# Patient Record
Sex: Female | Born: 1945 | Race: White | Hispanic: No | Marital: Married | Smoking: Former smoker
Health system: Southern US, Community
[De-identification: ages and names within clinical notes are randomized; demographics above are authoritative.]

## PROBLEM LIST (undated history)

## (undated) DIAGNOSIS — E785 Hyperlipidemia, unspecified: Secondary | ICD-10-CM

## (undated) DIAGNOSIS — I10 Essential (primary) hypertension: Secondary | ICD-10-CM

## (undated) DIAGNOSIS — F410 Panic disorder [episodic paroxysmal anxiety] without agoraphobia: Secondary | ICD-10-CM

## (undated) DIAGNOSIS — E119 Type 2 diabetes mellitus without complications: Secondary | ICD-10-CM

## (undated) DIAGNOSIS — J449 Chronic obstructive pulmonary disease, unspecified: Secondary | ICD-10-CM

## (undated) DIAGNOSIS — M1990 Unspecified osteoarthritis, unspecified site: Secondary | ICD-10-CM

## (undated) DIAGNOSIS — E039 Hypothyroidism, unspecified: Secondary | ICD-10-CM

## (undated) DIAGNOSIS — R0600 Dyspnea, unspecified: Secondary | ICD-10-CM

## (undated) DIAGNOSIS — D649 Anemia, unspecified: Secondary | ICD-10-CM

## (undated) DIAGNOSIS — K219 Gastro-esophageal reflux disease without esophagitis: Secondary | ICD-10-CM

## (undated) DIAGNOSIS — J189 Pneumonia, unspecified organism: Secondary | ICD-10-CM

## (undated) DIAGNOSIS — K2970 Gastritis, unspecified, without bleeding: Secondary | ICD-10-CM

## (undated) DIAGNOSIS — R1011 Right upper quadrant pain: Secondary | ICD-10-CM

## (undated) HISTORY — DX: Hyperlipidemia, unspecified: E78.5

## (undated) HISTORY — DX: Gastritis, unspecified, without bleeding: K29.70

## (undated) HISTORY — PX: TUBAL LIGATION: SHX77

---

## 2004-06-09 ENCOUNTER — Ambulatory Visit: Payer: Self-pay | Admitting: Endocrinology

## 2004-06-10 ENCOUNTER — Ambulatory Visit: Payer: Self-pay | Admitting: General Practice

## 2004-07-20 ENCOUNTER — Observation Stay (HOSPITAL_COMMUNITY): Admission: EM | Admit: 2004-07-20 | Discharge: 2004-07-21 | Payer: Self-pay | Admitting: Emergency Medicine

## 2004-07-22 ENCOUNTER — Ambulatory Visit (HOSPITAL_COMMUNITY): Admission: RE | Admit: 2004-07-22 | Discharge: 2004-07-22 | Payer: Self-pay | Admitting: Internal Medicine

## 2005-08-05 ENCOUNTER — Ambulatory Visit: Payer: Self-pay | Admitting: General Practice

## 2005-08-13 ENCOUNTER — Ambulatory Visit: Payer: Self-pay | Admitting: Endocrinology

## 2006-01-18 HISTORY — PX: BREAST BIOPSY: SHX20

## 2006-02-07 ENCOUNTER — Ambulatory Visit: Payer: Self-pay | Admitting: Endocrinology

## 2006-03-17 ENCOUNTER — Ambulatory Visit: Payer: Self-pay | Admitting: Gastroenterology

## 2006-07-25 ENCOUNTER — Ambulatory Visit: Payer: Self-pay | Admitting: General Surgery

## 2013-10-24 ENCOUNTER — Ambulatory Visit: Payer: Self-pay | Admitting: Endocrinology

## 2014-10-17 ENCOUNTER — Other Ambulatory Visit: Payer: Self-pay | Admitting: Endocrinology

## 2014-10-17 DIAGNOSIS — Z1231 Encounter for screening mammogram for malignant neoplasm of breast: Secondary | ICD-10-CM

## 2014-10-30 ENCOUNTER — Ambulatory Visit: Payer: Self-pay

## 2014-10-31 ENCOUNTER — Other Ambulatory Visit: Payer: Self-pay | Admitting: Endocrinology

## 2014-10-31 ENCOUNTER — Ambulatory Visit
Admission: RE | Admit: 2014-10-31 | Discharge: 2014-10-31 | Disposition: A | Discharge status: Home / Self Care | Payer: Medicare HMO | Source: Ambulatory Visit | Attending: Endocrinology | Admitting: Endocrinology

## 2014-10-31 DIAGNOSIS — Z1231 Encounter for screening mammogram for malignant neoplasm of breast: Secondary | ICD-10-CM | POA: Diagnosis not present

## 2015-10-21 ENCOUNTER — Other Ambulatory Visit: Payer: Self-pay | Admitting: Endocrinology

## 2015-10-21 DIAGNOSIS — Z1239 Encounter for other screening for malignant neoplasm of breast: Secondary | ICD-10-CM

## 2015-11-14 ENCOUNTER — Ambulatory Visit
Admission: RE | Admit: 2015-11-14 | Discharge: 2015-11-14 | Disposition: A | Discharge status: Home / Self Care | Payer: Medicare HMO | Source: Ambulatory Visit | Attending: Endocrinology | Admitting: Endocrinology

## 2015-11-14 DIAGNOSIS — Z1231 Encounter for screening mammogram for malignant neoplasm of breast: Secondary | ICD-10-CM | POA: Insufficient documentation

## 2015-11-14 DIAGNOSIS — Z1239 Encounter for other screening for malignant neoplasm of breast: Secondary | ICD-10-CM

## 2016-07-14 ENCOUNTER — Encounter: Payer: Self-pay | Admitting: Physician Assistant

## 2016-07-14 ENCOUNTER — Ambulatory Visit (INDEPENDENT_AMBULATORY_CARE_PROVIDER_SITE_OTHER): Payer: Medicare HMO | Admitting: Physician Assistant

## 2016-07-14 VITALS — BP 124/72 | HR 71 | Temp 97.6°F | Resp 16 | Ht 64.5 in | Wt 203.6 lb

## 2016-07-14 DIAGNOSIS — Z1212 Encounter for screening for malignant neoplasm of rectum: Secondary | ICD-10-CM

## 2016-07-14 DIAGNOSIS — I10 Essential (primary) hypertension: Secondary | ICD-10-CM

## 2016-07-14 DIAGNOSIS — Z1211 Encounter for screening for malignant neoplasm of colon: Secondary | ICD-10-CM | POA: Insufficient documentation

## 2016-07-14 DIAGNOSIS — K219 Gastro-esophageal reflux disease without esophagitis: Secondary | ICD-10-CM

## 2016-07-14 DIAGNOSIS — E785 Hyperlipidemia, unspecified: Secondary | ICD-10-CM | POA: Diagnosis not present

## 2016-07-14 DIAGNOSIS — E1165 Type 2 diabetes mellitus with hyperglycemia: Secondary | ICD-10-CM | POA: Insufficient documentation

## 2016-07-14 DIAGNOSIS — R600 Localized edema: Secondary | ICD-10-CM | POA: Insufficient documentation

## 2016-07-14 DIAGNOSIS — Z7689 Persons encountering health services in other specified circumstances: Secondary | ICD-10-CM | POA: Diagnosis not present

## 2016-07-14 DIAGNOSIS — E119 Type 2 diabetes mellitus without complications: Secondary | ICD-10-CM

## 2016-07-14 MED ORDER — HYDROCHLOROTHIAZIDE 25 MG PO TABS: 25.0000 mg | ORAL_TABLET | Freq: Every day | ORAL | 3 refills | Status: DC

## 2016-07-14 MED ORDER — ASPIRIN 81 MG PO TABS: 81.0000 mg | ORAL_TABLET | Freq: Every day | ORAL | 11 refills | Status: DC

## 2016-07-14 NOTE — Progress Notes (Signed)
Patient ID: Tamara [name] MRN: [id number], DOB: Jun 03, 1945, 71 y.o. Date of Encounter: @DATE @  Chief Complaint:  Chief Complaint  Patient presents with  . New Patient (Initial Visit)    HPI: 71 y.o. year old female  presents As a new patient to establish care.  Her husband sees me and I'm his primary care provider. She states that she had been seeing a PCP in [place]. Says that her last routine office visit there was almost 3 months ago says that her 3 month follow-up is due July 9th.  She says that "he will refer me for anything "and "he doesn't believe in colonoscopy ".  Says that her diabetes was diagnosed in 2008.  Is that she does have a blood pressure cuff at home. Says that she hasn't taken the lisinopril for the past 3 days because her blood pressure was reading low and she was feeling weak.  Reviewed that her medication list states Lasix 40 mg as needed. I asked how often she takes this on average and she says that she takes this every 2-3 days.  Says that she did have DEXA scan this year which was normal. This is she does have mammogram every year. Says that she has flu shot and has had pneumonia shot. Says that she doesn't need a shingles vaccine because she never had chickenpox.  Says that she takes Protonix every day and still feels indigestion symptoms. Has to put take Protonix in addition to Alka-Seltzer and Tums to try to get relief. Says that she informed her prior PCP about this multiple times and he kept mentioning doing a referral but never did do this.  Also she is interested in/agreeable to have colonoscopy for colorectal cancer screening.  She knows of no contraindication for her to take aspirin. Has had no diagnosed stomach ulcer or bleeding.  She is not fasting today.  No other specific concerns to address today.   History reviewed. No pertinent past medical history.   Home Meds: No outpatient prescriptions prior to visit.   No  facility-administered medications prior to visit.     Allergies:  Allergies  Allergen Reactions  . Penicillins Hives    Social History   Social History  . Marital status: Married    Spouse name: N/A  . Number of children: N/A  . Years of education: N/A   Occupational History  . Not on file.   Social History Main Topics  . Smoking status: Former Research scientist (life sciences)  . Smokeless tobacco: Never Used  . Alcohol use No  . Drug use: No  . Sexual activity: Not on file   Other Topics Concern  . Not on file   Social History Narrative  . No narrative on file    Family History  Problem Relation Age of Onset  . Breast cancer Neg Hx      Review of Systems:  See HPI for pertinent ROS. All other ROS negative.    Physical Exam: Blood pressure 124/72, pulse 71, temperature 97.6 F (36.4 C), temperature source Oral, resp. rate 16, height 5' 4.5" (1.638 m), weight 203 lb 9.6 oz (92.4 kg), SpO2 98 %., Body mass index is 34.41 kg/m. General: WF. Appears in no acute distress. Neck: Supple. No thyromegaly. No lymphadenopathy. Lungs: Clear bilaterally to auscultation without wheezes, rales, or rhonchi. Breathing is unlabored. Heart: RRR with S1 S2. No murmurs, rubs, or gallops. Abdomen: Soft, non-tender, non-distended with normoactive bowel sounds. No hepatomegaly. No rebound/guarding. No obvious abdominal masses.  Musculoskeletal:  Strength and tone normal for age. Extremities/Skin: Warm and dry.  No edema. Neuro: Alert and oriented X 3. Moves all extremities spontaneously. Gait is normal. CNII-XII grossly in tact. Psych:  Responds to questions appropriately with a normal affect.     ASSESSMENT AND PLAN:  71 y.o. year old female with   Encounter to establish care with new doctor   Type 2 diabetes mellitus without complication, without long-term current use of insulin ([place]) She states that it has not been quite 3 months since her last visit with prior PCP. Also she is not fasting  today. I will have her return for follow-up visit at the end of July and will have her come fasting to that appointment.  She is on metformin and glimepiride.  She is on ACE inhibitor. She is on statin. We'll have her add baby aspirin daily. - aspirin 81 MG tablet; Take 1 tablet (81 mg total) by mouth daily.  Dispense: 30 tablet; Refill: 11  -------Will discuss diabetic eye exam and diabetic foot exam at her next visit.  Hyperlipidemia, unspecified hyperlipidemia type She is on Crestor. She is not fasting today. Will have her schedule follow-up visit for the end of July and come fasting to recheck lab at that visit.  Essential hypertension Currently her Lasix is 40 mg every 2 days. It would make a lot more sense for her to take 20 mg on a daily basis or even changed to HCTZ. At this time I'll discontinue the Lasix and will change to HCTZ 25 mg daily. She also reports that she stopped taking the lisinopril for the past 3 days because she was feeling weak and lightheaded and her blood pressure was reading low. I wonder if she had taken the Lasix on that day when she was feeling flat weak and lightheaded and her blood pressure was reading low. Think that if we stop this high-dose Lasix intermittent dosing then she can tolerate ACE inhibitor which she needs given her diabetes. Therefore changing the Lasix to HCTZ 25 mg daily and will have her try to resume the lisinopril.    Bilateral lower extremity edema Currently her Lasix is 40 mg every 2 days. It would make a lot more sense for her to take 20 mg on a daily basis or even changed to HCTZ. At this time I'll discontinue the Lasix and will change to HCTZ 25 mg daily. She also reports that she stopped taking the lisinopril for the past 3 days because she was feeling weak and lightheaded and her blood pressure was reading low. I wonder if she had taken the Lasix on that day when she was feeling flat weak and lightheaded and her blood pressure was  reading low. Think that if we stop this high-dose Lasix intermittent dosing then she can tolerate ACE inhibitor which she needs given her diabetes. Therefore changing the Lasix to HCTZ 25 mg daily and will have her try to resume the lisinopril. - hydrochlorothiazide (HYDRODIURIL) 25 MG tablet; Take 1 tablet (25 mg total) by mouth daily.  Dispense: 30 tablet; Refill: 3  Gastroesophageal reflux disease, esophagitis presence not specified She is on protonix but symptoms are not well controlled with this. Needs follow-up with GI for possible endoscopy. - Ambulatory referral to Gastroenterology  Screening for colorectal cancer She has had no colonoscopy but is agreeable to have this for colorectal cancer screening. - Ambulatory referral to Gastroenterology  She states that she has mammogram every year.  She states that  she had DEXA scan this past year and reports that this was normal.  He reports that she has flu shots and has had a pneumonia shot. Says that she never had chickenpox so does not need shingles vaccine. We'll further discuss preventative care at follow-up visit.  Signed, [address] [place], [place], [facility] 07/14/2016 10:15 AM

## 2016-07-23 ENCOUNTER — Telehealth: Payer: Self-pay | Admitting: Physician Assistant

## 2016-07-23 ENCOUNTER — Other Ambulatory Visit: Payer: Self-pay

## 2016-07-23 DIAGNOSIS — R600 Localized edema: Secondary | ICD-10-CM

## 2016-07-23 DIAGNOSIS — E119 Type 2 diabetes mellitus without complications: Secondary | ICD-10-CM

## 2016-07-23 MED ORDER — ROSUVASTATIN CALCIUM 40 MG PO TABS: 40.0000 mg | ORAL_TABLET | Freq: Every day | ORAL | 0 refills | Status: DC

## 2016-07-23 MED ORDER — HYDROCHLOROTHIAZIDE 25 MG PO TABS: 25.0000 mg | ORAL_TABLET | Freq: Every day | ORAL | 3 refills | Status: DC

## 2016-07-23 MED ORDER — GLIMEPIRIDE 2 MG PO TABS: 2.0000 mg | ORAL_TABLET | Freq: Three times a day (TID) | ORAL | 0 refills | Status: DC

## 2016-07-23 MED ORDER — PANTOPRAZOLE SODIUM 40 MG PO TBEC: 40.0000 mg | DELAYED_RELEASE_TABLET | Freq: Every day | ORAL | 0 refills | Status: DC

## 2016-07-23 MED ORDER — METFORMIN HCL 1000 MG PO TABS: 1000.0000 mg | ORAL_TABLET | Freq: Two times a day (BID) | ORAL | 0 refills | Status: DC

## 2016-07-23 MED ORDER — ASPIRIN 81 MG PO TABS: 81.0000 mg | ORAL_TABLET | Freq: Every day | ORAL | 11 refills | Status: DC

## 2016-07-23 MED ORDER — LISINOPRIL 10 MG PO TABS: 10.0000 mg | ORAL_TABLET | Freq: Every day | ORAL | 0 refills | Status: DC

## 2016-07-23 MED ORDER — POTASSIUM CHLORIDE CRYS ER 20 MEQ PO TBCR: 20.0000 meq | EXTENDED_RELEASE_TABLET | Freq: Every day | ORAL | 0 refills | Status: DC

## 2016-07-23 MED ORDER — RANITIDINE HCL 300 MG PO TABS: 300.0000 mg | ORAL_TABLET | Freq: Every day | ORAL | 0 refills | Status: DC | PRN

## 2016-07-23 NOTE — Telephone Encounter (Signed)
I called patient about a referral. She stated that she needs all of her medication called into [facility] she states she is about out.   CB# [phone]

## 2016-07-23 NOTE — Telephone Encounter (Signed)
Refills sent to [facility]. 

## 2016-07-27 ENCOUNTER — Telehealth: Payer: Self-pay

## 2016-07-27 NOTE — Telephone Encounter (Signed)
[facility] sent over a fax request to confirm dose strength on patient medication Crestor. I called patient and she states her previous provider put her on crestor 40 mg but pt states that was to high so she changed it to 10mg ,  Patient has not had lab work done with our office yet so I explained to patient we could not send in refill. She has lab work done and then the provider will  decide if she wants to make changes to the medication.

## 2016-07-31 DIAGNOSIS — Z6833 Body mass index (BMI) 33.0-33.9, adult: Secondary | ICD-10-CM | POA: Diagnosis not present

## 2016-07-31 DIAGNOSIS — I10 Essential (primary) hypertension: Secondary | ICD-10-CM | POA: Diagnosis not present

## 2016-07-31 DIAGNOSIS — E119 Type 2 diabetes mellitus without complications: Secondary | ICD-10-CM | POA: Diagnosis not present

## 2016-07-31 DIAGNOSIS — K219 Gastro-esophageal reflux disease without esophagitis: Secondary | ICD-10-CM | POA: Diagnosis not present

## 2016-07-31 DIAGNOSIS — E785 Hyperlipidemia, unspecified: Secondary | ICD-10-CM | POA: Diagnosis not present

## 2016-07-31 DIAGNOSIS — E876 Hypokalemia: Secondary | ICD-10-CM | POA: Diagnosis not present

## 2016-08-20 ENCOUNTER — Telehealth: Payer: Self-pay | Admitting: Physician Assistant

## 2016-08-20 MED ORDER — GLUCOSE BLOOD VI STRP: ORAL_STRIP | 12 refills | Status: DC

## 2016-08-20 NOTE — Telephone Encounter (Signed)
Pt needs her test strips called into [facility] she uses the [facility].

## 2016-08-20 NOTE — Telephone Encounter (Signed)
Test strips sent to Tamara [name]

## 2016-08-23 ENCOUNTER — Encounter: Payer: Self-pay | Admitting: Physician Assistant

## 2016-08-23 ENCOUNTER — Ambulatory Visit (INDEPENDENT_AMBULATORY_CARE_PROVIDER_SITE_OTHER): Payer: Medicare HMO | Admitting: Physician Assistant

## 2016-08-23 VITALS — BP 124/86 | HR 74 | Temp 97.7°F | Resp 16 | Ht 65.0 in | Wt 203.0 lb

## 2016-08-23 DIAGNOSIS — E119 Type 2 diabetes mellitus without complications: Secondary | ICD-10-CM | POA: Diagnosis not present

## 2016-08-23 DIAGNOSIS — Z1159 Encounter for screening for other viral diseases: Secondary | ICD-10-CM | POA: Insufficient documentation

## 2016-08-23 DIAGNOSIS — E785 Hyperlipidemia, unspecified: Secondary | ICD-10-CM

## 2016-08-23 DIAGNOSIS — K219 Gastro-esophageal reflux disease without esophagitis: Secondary | ICD-10-CM

## 2016-08-23 DIAGNOSIS — Z1211 Encounter for screening for malignant neoplasm of colon: Secondary | ICD-10-CM | POA: Diagnosis not present

## 2016-08-23 DIAGNOSIS — Z1212 Encounter for screening for malignant neoplasm of rectum: Secondary | ICD-10-CM

## 2016-08-23 DIAGNOSIS — R600 Localized edema: Secondary | ICD-10-CM

## 2016-08-23 DIAGNOSIS — I10 Essential (primary) hypertension: Secondary | ICD-10-CM | POA: Diagnosis not present

## 2016-08-23 LAB — COMPLETE METABOLIC PANEL WITH GFR
ALBUMIN: 4.4 g/dL (ref 3.6–5.1)
ALT: 9 U/L (ref 6–29)
AST: 14 U/L (ref 10–35)
Alkaline Phosphatase: 47 U/L (ref 33–130)
BILIRUBIN TOTAL: 0.4 mg/dL (ref 0.2–1.2)
BUN: 20 mg/dL (ref 7–25)
CALCIUM: 9.8 mg/dL (ref 8.6–10.4)
CHLORIDE: 101 mmol/L (ref 98–110)
CO2: 26 mmol/L (ref 20–32)
CREATININE: 0.92 mg/dL (ref 0.50–0.99)
GFR, EST AFRICAN AMERICAN: 73 mL/min (ref >=60)
GFR, Est Non African American: 64 mL/min (ref >=60)
Glucose, Bld: 104 mg/dL — ABNORMAL HIGH (ref 70–99)
Potassium: 4.1 mmol/L (ref 3.5–5.3)
SODIUM: 138 mmol/L (ref 135–146)
TOTAL PROTEIN: 6.9 g/dL (ref 6.1–8.1)

## 2016-08-23 LAB — LIPID PANEL
CHOL/HDL RATIO: 3.6 ratio (ref <5.0)
CHOLESTEROL: 171 mg/dL (ref <200)
HDL: 47 mg/dL — AB (ref >50)
LDL CALC: 95 mg/dL (ref <100)
Triglycerides: 147 mg/dL (ref <150)
VLDL: 29 mg/dL (ref <30)

## 2016-08-23 NOTE — Progress Notes (Signed)
Patient ID: Tamara [name] MRN: [id number], DOB: 1945/02/06, 71 y.o. Date of Encounter: @DATE @  Chief Complaint:  Chief Complaint  Patient presents with  . 70 week routine office visit    HPI: 71 y.o. year old female     07/14/2016: presents As a new patient to establish care.  Her husband sees me and I'm his primary care provider. She states that she had been seeing a PCP in [place]. Says that her last routine office visit there was almost 3 months ago says that her 3 month follow-up is due July 9th.  She says that "he will refer me for anything "and "he doesn't believe in colonoscopy ".  Says that her diabetes was diagnosed in 2008.  Is that she does have a blood pressure cuff at home. Says that she hasn't taken the lisinopril for the past 3 days because her blood pressure was reading low and she was feeling weak.  Reviewed that her medication list states Lasix 40 mg as needed. I asked how often she takes this on average and she says that she takes this every 2-3 days.  Says that she did have DEXA scan this year which was normal. This is she does have mammogram every year. Says that she has flu shot and has had pneumonia shot. Says that she doesn't need a shingles vaccine because she never had chickenpox.  Says that she takes Protonix every day and still feels indigestion symptoms. Has to put take Protonix in addition to Alka-Seltzer and Tums to try to get relief. Says that she informed her prior PCP about this multiple times and he kept mentioning doing a referral but never did do this.  Also she is interested in/agreeable to have colonoscopy for colorectal cancer screening.  She knows of no contraindication for her to take aspirin. Has had no diagnosed stomach ulcer or bleeding.  She is not fasting today.  No other specific concerns to address today.    AT THAT OV:  She states that it has not been quite 3 months since her last visit with prior PCP. Also  she is not fasting today. I will have her return for follow-up visit at the end of July and will have her come fasting to that appointment.   Currently her Lasix is 40 mg every 2 days. It would make a lot more sense for her to take 20 mg on a daily basis or even changed to HCTZ. At this time I'll discontinue the Lasix and will change to HCTZ 25 mg daily. She also reports that she stopped taking the lisinopril for the past 3 days because she was feeling weak and lightheaded and her blood pressure was reading low. I wonder if she had taken the Lasix on that day when she was feeling flat weak and lightheaded and her blood pressure was reading low. Think that if we stop this high-dose Lasix intermittent dosing then she can tolerate ACE inhibitor which she needs given her diabetes. Therefore changing the Lasix to HCTZ 25 mg daily and will have her try to resume the lisinopril.  She is on protonix but symptoms are not well controlled with this. Needs follow-up with GI for possible endoscopy. - Ambulatory referral to Gastroenterology  She has had no colonoscopy but is agreeable to have this for colorectal cancer screening. - Ambulatory referral to Gastroenterology    08/23/2016: Last office visit I also recommended her start aspirin. She states that she is not gonna start the  aspirin until she has her evaluation with GI. Otherwise she did follow through with everything that I had recommended at last visit. She is fasting today for lab. She is able to leave a urine sample today. She did stop the Lasix. She did start the HCTZ daily and says that the swelling is "fine". She has been able to resume the lisinopril and is taking this daily and is no longer having any lightheadedness or other adverse effects. Says that all that resolved since we changed the Lasix to HCTZ. She states that she did get her appointment with GI as scheduled and its for next Monday one week from today. She reports that she does  have diabetic eye exam annually. Says the last one was around November or December 2017. Says "got new glasses then 2 ". She reports that her prior PCP had educated her regarding diabetic foot care. She has no other specific concerns to address today.     No past medical history on file.   Home Meds: Outpatient Medications Prior to Visit  Medication Sig Dispense Refill  . aspirin 81 MG tablet Take 1 tablet (81 mg total) by mouth daily. 90 tablet 11  . glimepiride (AMARYL) 2 MG tablet Take 1 tablet (2 mg total) by mouth 3 (three) times daily. 270 tablet 0  . glucose blood test strip Use as instructed to check blood sugar twice daily 100 each 12  . hydrochlorothiazide (HYDRODIURIL) 25 MG tablet Take 1 tablet (25 mg total) by mouth daily. 30 tablet 3  . lisinopril (PRINIVIL,ZESTRIL) 10 MG tablet Take 1 tablet (10 mg total) by mouth daily. 90 tablet 0  . metFORMIN (GLUCOPHAGE) 1000 MG tablet Take 1 tablet (1,000 mg total) by mouth 2 (two) times daily. 90 tablet 0  . pantoprazole (PROTONIX) 40 MG tablet Take 1 tablet (40 mg total) by mouth daily. 90 tablet 0  . potassium chloride SA (K-DUR,KLOR-CON) 20 MEQ tablet Take 1 tablet (20 mEq total) by mouth daily. 90 tablet 0  . ranitidine (ZANTAC) 300 MG tablet Take 1 tablet (300 mg total) by mouth daily as needed. 90 tablet 0  . rosuvastatin (CRESTOR) 40 MG tablet Take 1 tablet (40 mg total) by mouth at bedtime. Patient taking 10mg  90 tablet 0   No facility-administered medications prior to visit.     Allergies:  Allergies  Allergen Reactions  . Penicillins Hives    Social History   Social History  . Marital status: Married    Spouse name: N/A  . Number of children: N/A  . Years of education: N/A   Occupational History  . Not on file.   Social History Main Topics  . Smoking status: Former Research scientist (life sciences)  . Smokeless tobacco: Never Used  . Alcohol use No  . Drug use: No  . Sexual activity: Not on file   Other Topics Concern  . Not on  file   Social History Narrative  . No narrative on file    Family History  Problem Relation Age of Onset  . Breast cancer Neg Hx      Review of Systems:  See HPI for pertinent ROS. All other ROS negative.    Physical Exam: Blood pressure 124/86, pulse 74, weight 203 lb (92.1 kg), SpO2 98 %., Body mass index is 34.31 kg/m. General: WF. Appears in no acute distress. Neck: Supple. No thyromegaly. No lymphadenopathy.No carotid bruit. Lungs: Clear bilaterally to auscultation without wheezes, rales, or rhonchi. Breathing is unlabored. Heart: RRR with S1 S2.  No murmurs, rubs, or gallops. Abdomen: Soft, non-tender, non-distended with normoactive bowel sounds. No hepatomegaly. No rebound/guarding. No obvious abdominal masses. Musculoskeletal:  Strength and tone normal for age. Extremities/Skin: Warm and dry.  No LE edema. Diabetic foot exam:  inspection is normal. Neuro: Alert and oriented X 3. Moves all extremities spontaneously. Gait is normal. CNII-XII grossly in tact. Psych:  Responds to questions appropriately with a normal affect.     ASSESSMENT AND PLAN:  71 y.o. year old female with     Type 2 diabetes mellitus without complication, without long-term current use of insulin ([place])  She is on metformin and glimepiride.  She is on ACE inhibitor. She is on statin. We'll have her add baby aspirin daily.----08/23/2016-----She will wait to start after she has eval at GI - aspirin 81 MG tablet; Take 1 tablet (81 mg total) by mouth daily.  Dispense: 30 tablet; Refill: 11  She does have annual eye exam. I told her that when she goes for her next eye exam to have them list as is her PCP and send copy of note. She has been educated regarding diabetic foot care.  08/23/2016 A1C, MicroAlbumin, CMET    Hyperlipidemia, unspecified hyperlipidemia type She is on Crestor.  08/23/2016: She is fasting. Check FLP LFT now.    Essential hypertension 08/23/16 : Blood Pressure is at goal. She  is on ACE inhibitor. Check lab to monitor.    Bilateral lower extremity edema 08/23/16: At last visit changed her Lasix intermittent dosing to HCTZ daily. She states that this is working well on her edema is controlled. Check lab to monitor.  Gastroesophageal reflux disease, esophagitis presence not specified 08/23/16: at last visit I put in referral to GI. She states that her appointment as scheduled 1 week from today.  Screening for colorectal cancer 08/23/16: at last visit I put in referral to GI. She states that her appointment as scheduled 1 week from today.  Hepatitis C Screening 08/23/2016: Will check lab for this today   She states that she has mammogram every year.  She states that she had DEXA scan this past year and reports that this was normal.  She reports that she has flu shots and has had a pneumonia shot. Says that she never had chickenpox so does not need shingles vaccine. Will further discuss preventative care at follow-up visit.   Plan for routine follow-up visit in 3 months. Sooner if needed.   Signed, [address] [place], [place], [facility] 08/23/2016 8:06 AM

## 2016-08-24 LAB — HEMOGLOBIN A1C
HEMOGLOBIN A1C: 5.4 % (ref <5.7)
Mean Plasma Glucose: 108 mg/dL

## 2016-08-24 LAB — MICROALBUMIN, URINE: Microalb, Ur: 0.2 mg/dL

## 2016-08-24 LAB — HEPATITIS C ANTIBODY: HCV Ab: NONREACTIVE

## 2016-08-27 ENCOUNTER — Telehealth: Payer: Self-pay

## 2016-08-27 NOTE — Telephone Encounter (Signed)
I called pt to provide lab results. She states she has been taking her husband's 10 mg crestor b/c her Rx is 40mg  and she feels that is to much. Pt is asking to have her crestor changed to 10 mg.Pls advise

## 2016-08-28 NOTE — Telephone Encounter (Signed)
Her cholesterol numbers are good with current dose that she has been taking.  Remove Crestor 40mg  from med list.  Change to Crestor 10mg  1 po QD # 90 + 1 Inform pt.

## 2016-08-30 DIAGNOSIS — K5900 Constipation, unspecified: Secondary | ICD-10-CM | POA: Diagnosis not present

## 2016-08-30 DIAGNOSIS — K219 Gastro-esophageal reflux disease without esophagitis: Secondary | ICD-10-CM | POA: Diagnosis not present

## 2016-08-30 DIAGNOSIS — Z1211 Encounter for screening for malignant neoplasm of colon: Secondary | ICD-10-CM | POA: Diagnosis not present

## 2016-08-30 DIAGNOSIS — R1013 Epigastric pain: Secondary | ICD-10-CM | POA: Diagnosis not present

## 2016-08-30 MED ORDER — ROSUVASTATIN CALCIUM 10 MG PO TABS: 10.0000 mg | ORAL_TABLET | Freq: Every day | ORAL | 3 refills | Status: DC

## 2016-08-30 NOTE — Telephone Encounter (Signed)
Change made LVM for patient

## 2016-09-07 ENCOUNTER — Other Ambulatory Visit: Payer: Self-pay

## 2016-09-07 MED ORDER — METFORMIN HCL 1000 MG PO TABS: 1000.0000 mg | ORAL_TABLET | Freq: Two times a day (BID) | ORAL | 0 refills | Status: DC

## 2016-09-07 MED ORDER — BLOOD GLUCOSE MONITOR KIT: PACK | 0 refills | Status: DC

## 2016-09-07 MED ORDER — BD SWAB SINGLE USE REGULAR PADS: MEDICATED_PAD | 5 refills | Status: DC

## 2016-10-18 ENCOUNTER — Ambulatory Visit (INDEPENDENT_AMBULATORY_CARE_PROVIDER_SITE_OTHER): Payer: Medicare HMO | Admitting: *Deleted

## 2016-10-18 DIAGNOSIS — Z23 Encounter for immunization: Secondary | ICD-10-CM | POA: Diagnosis not present

## 2016-10-19 NOTE — Progress Notes (Signed)
Patient seen in office for Influenza Vaccination.   Tolerated IM administration well.   Immunization history updated.  

## 2016-10-22 ENCOUNTER — Other Ambulatory Visit: Payer: Self-pay | Admitting: *Deleted

## 2016-10-22 MED ORDER — PANTOPRAZOLE SODIUM 40 MG PO TBEC: 40.0000 mg | DELAYED_RELEASE_TABLET | Freq: Every day | ORAL | 3 refills | Status: DC

## 2016-11-05 ENCOUNTER — Other Ambulatory Visit: Payer: Self-pay | Admitting: Physician Assistant

## 2016-11-05 DIAGNOSIS — R600 Localized edema: Secondary | ICD-10-CM

## 2016-11-05 NOTE — Telephone Encounter (Signed)
Medication refilled per protocol. 

## 2016-11-09 ENCOUNTER — Other Ambulatory Visit: Payer: Self-pay

## 2016-11-22 ENCOUNTER — Ambulatory Visit (INDEPENDENT_AMBULATORY_CARE_PROVIDER_SITE_OTHER): Payer: Medicare HMO | Admitting: Physician Assistant

## 2016-11-22 ENCOUNTER — Encounter: Payer: Self-pay | Admitting: Physician Assistant

## 2016-11-22 VITALS — BP 126/84 | HR 69 | Temp 97.8°F | Resp 14 | Wt 209.0 lb

## 2016-11-22 DIAGNOSIS — I10 Essential (primary) hypertension: Secondary | ICD-10-CM | POA: Diagnosis not present

## 2016-11-22 DIAGNOSIS — R600 Localized edema: Secondary | ICD-10-CM | POA: Diagnosis not present

## 2016-11-22 DIAGNOSIS — E785 Hyperlipidemia, unspecified: Secondary | ICD-10-CM

## 2016-11-22 DIAGNOSIS — K219 Gastro-esophageal reflux disease without esophagitis: Secondary | ICD-10-CM | POA: Diagnosis not present

## 2016-11-22 DIAGNOSIS — E119 Type 2 diabetes mellitus without complications: Secondary | ICD-10-CM

## 2016-11-22 NOTE — Progress Notes (Signed)
Patient ID: Tamara [name] MRN: [id number], DOB: [birth year], 71 y.o. Date of Encounter: '@DATE'$ @  Chief Complaint:  Chief Complaint  Patient presents with  . 3 month follow up    HPI: 71 y.o. year old female     07/14/2016: presents As a new patient to establish care.  Her husband sees me and I'm his primary care provider. She states that she had been seeing a PCP in [place]. Says that her last routine office visit there was almost 3 months ago says that her 3 month follow-up is due July 9th.  She says that "he will refer me for anything "and "he doesn't believe in colonoscopy ".  Says that her diabetes was diagnosed in 2008.  Is that she does have a blood pressure cuff at home. Says that she hasn't taken the lisinopril for the past 3 days because her blood pressure was reading low and she was feeling weak.  Reviewed that her medication list states Lasix 40 mg as needed. I asked how often she takes this on average and she says that she takes this every 2-3 days.  Says that she did have DEXA scan this year which was normal. Says she does have mammogram every year. Says that she has flu shot and has had pneumonia shot. Says that she doesn't need a shingles vaccine because she never had chickenpox.  Says that she takes Protonix every day and still feels indigestion symptoms. Has to put take Protonix in addition to Alka-Seltzer and Tums to try to get relief. Says that she informed her prior PCP about this multiple times and he kept mentioning doing a referral but never did do this.  Also she is interested in/agreeable to have colonoscopy for colorectal cancer screening.  She knows of no contraindication for her to take aspirin. Has had no diagnosed stomach ulcer or bleeding.  She is not fasting today.  No other specific concerns to address today.    AT THAT OV:  She states that it has not been quite 3 months since her last visit with prior PCP. Also she is not  fasting today. I will have her return for follow-up visit at the end of July and will have her come fasting to that appointment.   Currently her Lasix is 40 mg every 2 days. It would make a lot more sense for her to take 20 mg on a daily basis or even changed to HCTZ. At this time I'll discontinue the Lasix and will change to HCTZ 25 mg daily. She also reports that she stopped taking the lisinopril for the past 3 days because she was feeling weak and lightheaded and her blood pressure was reading low. I wonder if she had taken the Lasix on that day when she was feeling flat weak and lightheaded and her blood pressure was reading low. Think that if we stop this high-dose Lasix intermittent dosing then she can tolerate ACE inhibitor which she needs given her diabetes. Therefore changing the Lasix to HCTZ 25 mg daily and will have her try to resume the lisinopril.  She is on protonix but symptoms are not well controlled with this. Needs follow-up with GI for possible endoscopy. - Ambulatory referral to Gastroenterology  She has had no colonoscopy but is agreeable to have this for colorectal cancer screening. - Ambulatory referral to Gastroenterology    08/23/2016: Last office visit I also recommended her start aspirin. She states that she is not going to start the aspirin  until she has her evaluation with GI. Otherwise she did follow through with everything that I had recommended at last visit. She is fasting today for lab. She is able to leave a urine sample today. She did stop the Lasix. She did start the HCTZ daily and says that the swelling is "fine". She has been able to resume the lisinopril and is taking this daily and is no longer having any lightheadedness or other adverse effects. Says that all that resolved since we changed the Lasix to HCTZ. She states that she did get her appointment with GI as scheduled and its for next Monday one week from today. She reports that she does have  diabetic eye exam annually. Says the last one was around November or December 2017. Says "got new glasses then too ". She reports that her prior PCP had educated her regarding diabetic foot care. She has no other specific concerns to address today.   11/22/2016: Today I reviewed that labs from last visit 08/23/2016 showed A1c 5.4 and I recommended for her to stop the glimepiride/Amaryl.   Today I verified with her that she did stop this medication as directed. She reports that she frequently has problems with ingrown toenails on both of her first toes.  Says that she digs them out herself but is concerned about these, given her diabetes.  Also states that those toes frequently hurt when wearing shoes.  She has no other specific concerns to address today. She is taking blood pressure medications as directed.  No lightheadedness or other adverse effects. She is taking her metformin as directed.  No adverse effects. She is taking her Crestor as directed.  No myalgias or other adverse effects.     No past medical history on file.   Home Meds: Outpatient Medications Prior to Visit  Medication Sig Dispense Refill  . Alcohol Swabs (B-D SINGLE USE SWABS REGULAR) PADS Use to check blood sugar twice daily. 200 each 5  . blood glucose meter kit and supplies KIT Dispense based on patient and insurance preference. Use up to four times daily as directed. (FOR ICD-10 E11.9). 1 each 0  . glucose blood test strip Use as instructed to check blood sugar twice daily 100 each 12  . hydrochlorothiazide (HYDRODIURIL) 25 MG tablet TAKE 1 TABLET (25 MG TOTAL) BY MOUTH DAILY. 90 tablet 0  . KLOR-CON M20 20 MEQ tablet TAKE 1 TABLET EVERY DAY 90 tablet 0  . lisinopril (PRINIVIL,ZESTRIL) 10 MG tablet TAKE 1 TABLET EVERY DAY 90 tablet 0  . metFORMIN (GLUCOPHAGE) 1000 MG tablet TAKE 1 TABLET TWICE DAILY 180 tablet 0  . pantoprazole (PROTONIX) 40 MG tablet Take 1 tablet (40 mg total) by mouth daily. 90 tablet 3  .  ranitidine (ZANTAC) 300 MG tablet Take 1 tablet (300 mg total) by mouth daily as needed. 90 tablet 0  . rosuvastatin (CRESTOR) 10 MG tablet Take 1 tablet (10 mg total) by mouth daily. 90 tablet 3  . aspirin 81 MG tablet Take 1 tablet (81 mg total) by mouth daily. 90 tablet 11  .    0   No facility-administered medications prior to visit.     Allergies:  Allergies  Allergen Reactions  . Penicillins Hives    Social History   Socioeconomic History  . Marital status: Married    Spouse name: Not on file  . Number of children: Not on file  . Years of education: Not on file  . Highest education level: Not on  file  Social Needs  . Financial resource strain: Not on file  . Food insecurity - worry: Not on file  . Food insecurity - inability: Not on file  . Transportation needs - medical: Not on file  . Transportation needs - non-medical: Not on file  Occupational History  . Not on file  Tobacco Use  . Smoking status: Former Research scientist (life sciences)  . Smokeless tobacco: Never Used  Substance and Sexual Activity  . Alcohol use: No  . Drug use: No  . Sexual activity: Not on file  Other Topics Concern  . Not on file  Social History Narrative  . Not on file    Family History  Problem Relation Age of Onset  . Breast cancer Neg Hx      Review of Systems:  See HPI for pertinent ROS. All other ROS negative.    Physical Exam: Blood pressure 126/84, pulse 69, temperature 97.8 F (36.6 C), temperature source Oral, resp. rate 14, weight 94.8 kg (209 lb), SpO2 98 %., There is no height or weight on file to calculate BMI. General: WF. Appears in no acute distress. Neck: Supple. No thyromegaly. No lymphadenopathy.No carotid bruit. Lungs: Clear bilaterally to auscultation without wheezes, rales, or rhonchi. Breathing is unlabored. Heart: RRR with S1 S2. No murmurs, rubs, or gallops. Abdomen: Soft, non-tender, non-distended with normoactive bowel sounds. No hepatomegaly. No rebound/guarding. No obvious  abdominal masses. Musculoskeletal:  Strength and tone normal for age. Extremities/Skin: Warm and dry.  No LE edema. Diabetic foot exam:  inspection is normal.No palpable DP or PT pulses bilaterally. Sensation intact. Neuro: Alert and oriented X 3. Moves all extremities spontaneously. Gait is normal. CNII-XII grossly in tact. Psych:  Responds to questions appropriately with a normal affect.     ASSESSMENT AND PLAN:  71 y.o. year old female with     Type 2 diabetes mellitus without complication, without long-term current use of insulin ([place])  She is on metformin.  She is on ACE inhibitor. She is on statin. We'll have her add baby aspirin daily.----08/23/2016-----She will wait to start after she has eval at GI - aspirin 81 MG tablet; Take 1 tablet (81 mg total) by mouth daily.  Dispense: 30 tablet; Refill: 11  She does have annual eye exam. I told her that when she goes for her next eye exam to have them list as is her PCP and send copy of note. She has been educated regarding diabetic foot care.  08/23/2016 A1C, MicroAlbumin, CMET 11/22/2016: Check A1C ------------referral ordered for Podiatry   Hyperlipidemia, unspecified hyperlipidemia type She is on Crestor.  08/23/2016: She is fasting. Check FLP LFT now. 11/22/2016: Lipid panel was checked 08/23/16.  LDL was 95.  Continue current dose Crestor.   Essential hypertension 11/22/16 : Blood Pressure is at goal. She is on ACE inhibitor. Check lab to monitor every 6 months.    Bilateral lower extremity edema 08/23/16: At last visit changed her Lasix intermittent dosing to HCTZ daily. She states that this is working well on her edema is controlled. Check lab to monitor. 11/22/2016: Lower Extremity edema is controlled.  Continue HCTZ 25 mg daily.  Gastroesophageal reflux disease, esophagitis presence not specified 08/23/16: at last visit I put in referral to GI. She states that her appointment as scheduled 1 week from today. 11/22/2016: She  states that her appointment with GI is not until January.  Screening for colorectal cancer 08/23/16: at last visit I put in referral to GI. She states that her appointment  as scheduled 1 week from today. 11/22/2016: She states that her appointment with GI is not until January.  Hepatitis C Screening 08/23/2016: Will check lab for this today   She states that she has mammogram every year.  She states that she had DEXA scan this past year and reports that this was normal.  She reports that she has flu shots and has had a pneumonia shot. Says that she never had chickenpox so does not need shingles vaccine. Will further discuss preventative care at follow-up visit.   Plan for routine follow-up visit in 3 months. Sooner if needed.   Signed, [address] [place], [place], [facility] 11/22/2016 8:07 AM

## 2016-11-23 LAB — HEMOGLOBIN A1C
EAG (MMOL/L): 6.8 (calc)
Hgb A1c MFr Bld: 5.9 % of total Hgb — ABNORMAL HIGH (ref <5.7)
MEAN PLASMA GLUCOSE: 123 (calc)

## 2016-11-24 ENCOUNTER — Other Ambulatory Visit: Payer: Self-pay

## 2016-11-24 MED ORDER — METFORMIN HCL 500 MG PO TABS: 500.0000 mg | ORAL_TABLET | Freq: Two times a day (BID) | ORAL | 0 refills | Status: DC

## 2016-12-08 ENCOUNTER — Ambulatory Visit: Payer: Medicare HMO

## 2016-12-08 ENCOUNTER — Ambulatory Visit: Payer: Medicare HMO | Admitting: Podiatry

## 2016-12-08 ENCOUNTER — Encounter: Payer: Self-pay | Admitting: Podiatry

## 2016-12-08 DIAGNOSIS — L600 Ingrowing nail: Secondary | ICD-10-CM | POA: Diagnosis not present

## 2016-12-08 NOTE — Patient Instructions (Signed)

## 2016-12-14 NOTE — Progress Notes (Signed)
   Subjective: Patient presents today for evaluation of pain to the lateral borders of bilateral great toes that has been present for the past several weeks. She reports associated redness of the areas. Patient is concerned for possible ingrown nail. Applying pressure to the areas increase the pain. She denies alleviating factors. Patient presents today for further treatment and evaluation.   No past medical history on file.   Objective:  General: Well developed, nourished, in no acute distress, alert and oriented x3   Dermatology: Skin is warm, dry and supple bilateral. Lateral borders of bilateral great toes appear to be erythematous with evidence of an ingrowing nails. Pain on palpation noted to the border of the nail folds. The remaining nails appear unremarkable at this time. There are no open sores, lesions.  Vascular: Dorsalis Pedis artery and Posterior Tibial artery pedal pulses palpable. No lower extremity edema noted.   Neruologic: Grossly intact via light touch bilateral.  Musculoskeletal: Muscular strength within normal limits in all groups bilateral. Normal range of motion noted to all pedal and ankle joints.   Assesement: #1 Paronychia with ingrowing nail of the lateral borders of bilateral great toes #2 Pain in toe #3 Incurvated nail  Plan of Care:  1. Patient evaluated.  2. Discussed treatment alternatives and plan of care. Explained nail avulsion procedure and post procedure course to patient. 3. Patient opted for permanent partial nail avulsion.  4. Prior to procedure, local anesthesia infiltration utilized using 3 ml of a 50:50 mixture of 2% plain lidocaine and 0.5% plain marcaine in a normal hallux block fashion and a betadine prep performed.  5. Partial permanent nail avulsion with chemical matrixectomy performed using [id number] applications of phenol followed by alcohol flush.  6. Light dressing applied. 7. Return to clinic in 2 weeks.   Edrick Kins, DPM [facility]  Dr. Edrick Kins, [address]                                        [place],  [id number]                Office [phone]  Fax [phone]

## 2016-12-22 ENCOUNTER — Ambulatory Visit (INDEPENDENT_AMBULATORY_CARE_PROVIDER_SITE_OTHER): Payer: Medicare HMO | Admitting: Podiatry

## 2016-12-22 DIAGNOSIS — L600 Ingrowing nail: Secondary | ICD-10-CM | POA: Diagnosis not present

## 2016-12-26 NOTE — Progress Notes (Signed)
   Subjective: Patient presents today 2 weeks post ingrown nail permanent nail avulsion procedure of the lateral border of bilateral great toes. Patient states that the toe and nail fold is feeling much better.  She reports soaking the feet and applying antibiotic ointment as directed.  No past medical history on file.  Objective: Skin is warm, dry and supple. Nail and respective nail fold appears to be healing appropriately. Open wound to the associated nail fold with a granular wound base and moderate amount of fibrotic tissue. Minimal drainage noted. Mild erythema around the periungual region likely due to phenol chemical matricectomy.  Assessment: #1 postop permanent partial nail avulsion lateral borders of bilateral great toes #2 open wound periungual nail fold of respective digit.   Plan of care: #1 patient was evaluated  #2 debridement of open wound was performed to the periungual border of the respective toe using a currette. Antibiotic ointment and Band-Aid was applied. #3 patient is to return to clinic on a PRN basis.   Edrick Kins, DPM [facility]  Dr. Edrick Kins, [place]                                        [place], [place] [address]                Office [phone]  Fax [phone]

## 2017-02-07 ENCOUNTER — Encounter: Payer: Self-pay | Admitting: *Deleted

## 2017-02-08 ENCOUNTER — Ambulatory Visit
Admission: RE | Admit: 2017-02-08 | Discharge: 2017-02-08 | Disposition: A | Discharge status: Home / Self Care | Payer: Medicare HMO | Source: Ambulatory Visit | Attending: Gastroenterology | Admitting: Gastroenterology

## 2017-02-08 ENCOUNTER — Encounter: Payer: Self-pay | Admitting: *Deleted

## 2017-02-08 ENCOUNTER — Ambulatory Visit: Payer: Medicare HMO | Admitting: Anesthesiology

## 2017-02-08 ENCOUNTER — Encounter
Admission: RE | Disposition: A | Discharge status: Home / Self Care | Payer: Self-pay | Source: Ambulatory Visit | Attending: Gastroenterology

## 2017-02-08 DIAGNOSIS — R1013 Epigastric pain: Secondary | ICD-10-CM | POA: Diagnosis not present

## 2017-02-08 DIAGNOSIS — K2950 Unspecified chronic gastritis without bleeding: Secondary | ICD-10-CM | POA: Insufficient documentation

## 2017-02-08 DIAGNOSIS — K5730 Diverticulosis of large intestine without perforation or abscess without bleeding: Secondary | ICD-10-CM | POA: Insufficient documentation

## 2017-02-08 DIAGNOSIS — D125 Benign neoplasm of sigmoid colon: Secondary | ICD-10-CM | POA: Diagnosis not present

## 2017-02-08 DIAGNOSIS — Z79899 Other long term (current) drug therapy: Secondary | ICD-10-CM | POA: Diagnosis not present

## 2017-02-08 DIAGNOSIS — K210 Gastro-esophageal reflux disease with esophagitis: Secondary | ICD-10-CM | POA: Insufficient documentation

## 2017-02-08 DIAGNOSIS — Z880 Allergy status to penicillin: Secondary | ICD-10-CM | POA: Diagnosis not present

## 2017-02-08 DIAGNOSIS — E119 Type 2 diabetes mellitus without complications: Secondary | ICD-10-CM | POA: Insufficient documentation

## 2017-02-08 DIAGNOSIS — K219 Gastro-esophageal reflux disease without esophagitis: Secondary | ICD-10-CM | POA: Diagnosis not present

## 2017-02-08 DIAGNOSIS — Z87891 Personal history of nicotine dependence: Secondary | ICD-10-CM | POA: Insufficient documentation

## 2017-02-08 DIAGNOSIS — D128 Benign neoplasm of rectum: Secondary | ICD-10-CM | POA: Diagnosis not present

## 2017-02-08 DIAGNOSIS — K640 First degree hemorrhoids: Secondary | ICD-10-CM | POA: Insufficient documentation

## 2017-02-08 DIAGNOSIS — K5790 Diverticulosis of intestine, part unspecified, without perforation or abscess without bleeding: Secondary | ICD-10-CM | POA: Diagnosis not present

## 2017-02-08 DIAGNOSIS — Z1211 Encounter for screening for malignant neoplasm of colon: Secondary | ICD-10-CM | POA: Diagnosis not present

## 2017-02-08 DIAGNOSIS — K648 Other hemorrhoids: Secondary | ICD-10-CM | POA: Diagnosis not present

## 2017-02-08 DIAGNOSIS — I10 Essential (primary) hypertension: Secondary | ICD-10-CM | POA: Diagnosis not present

## 2017-02-08 DIAGNOSIS — Z13818 Encounter for screening for other digestive system disorders: Secondary | ICD-10-CM | POA: Diagnosis not present

## 2017-02-08 DIAGNOSIS — D124 Benign neoplasm of descending colon: Secondary | ICD-10-CM | POA: Insufficient documentation

## 2017-02-08 DIAGNOSIS — K2960 Other gastritis without bleeding: Secondary | ICD-10-CM | POA: Diagnosis not present

## 2017-02-08 DIAGNOSIS — K621 Rectal polyp: Secondary | ICD-10-CM | POA: Insufficient documentation

## 2017-02-08 DIAGNOSIS — K635 Polyp of colon: Secondary | ICD-10-CM | POA: Diagnosis not present

## 2017-02-08 DIAGNOSIS — Z7984 Long term (current) use of oral hypoglycemic drugs: Secondary | ICD-10-CM | POA: Diagnosis not present

## 2017-02-08 HISTORY — DX: Type 2 diabetes mellitus without complications: E11.9

## 2017-02-08 HISTORY — DX: Essential (primary) hypertension: I10

## 2017-02-08 HISTORY — PX: COLONOSCOPY WITH PROPOFOL: SHX5780

## 2017-02-08 HISTORY — PX: ESOPHAGOGASTRODUODENOSCOPY (EGD) WITH PROPOFOL: SHX5813

## 2017-02-08 HISTORY — DX: Gastro-esophageal reflux disease without esophagitis: K21.9

## 2017-02-08 LAB — GLUCOSE, CAPILLARY: Glucose-Capillary: 118 mg/dL — ABNORMAL HIGH (ref 65–99)

## 2017-02-08 SURGERY — COLONOSCOPY WITH PROPOFOL
Anesthesia: General

## 2017-02-08 MED ORDER — LACTATED RINGERS IV SOLN
INTRAVENOUS | Status: DC | PRN
  Administered 2017-02-08: 10:00:00 via INTRAVENOUS

## 2017-02-08 MED ORDER — LIDOCAINE HCL (PF) 1 % IJ SOLN
INTRAMUSCULAR | Status: AC
  Administered 2017-02-08: 0.3 mL via INTRADERMAL
  Filled 2017-02-08: qty 2

## 2017-02-08 MED ORDER — SODIUM CHLORIDE 0.9 % IV SOLN: INTRAVENOUS | Status: DC

## 2017-02-08 MED ORDER — FENTANYL CITRATE (PF) 100 MCG/2ML IJ SOLN
INTRAMUSCULAR | Status: DC | PRN
  Administered 2017-02-08 (×3): 25 ug via INTRAVENOUS

## 2017-02-08 MED ORDER — LIDOCAINE HCL (PF) 1 % IJ SOLN
2.0000 mL | Freq: Once | INTRAMUSCULAR | Status: AC
Start: 2017-02-08 — End: 2017-02-08
  Administered 2017-02-08: 0.3 mL via INTRADERMAL

## 2017-02-08 MED ORDER — PROPOFOL 500 MG/50ML IV EMUL
INTRAVENOUS | Status: DC | PRN
  Administered 2017-02-08: 100 ug/kg/min via INTRAVENOUS

## 2017-02-08 MED ORDER — PROPOFOL 10 MG/ML IV BOLUS
INTRAVENOUS | Status: DC | PRN
  Administered 2017-02-08 (×3): 50 mg via INTRAVENOUS

## 2017-02-08 MED ORDER — SODIUM CHLORIDE 0.9 % IV SOLN
INTRAVENOUS | Status: DC
  Administered 2017-02-08: 1000 mL via INTRAVENOUS

## 2017-02-08 MED ORDER — PROPOFOL 500 MG/50ML IV EMUL
INTRAVENOUS | Status: AC
  Filled 2017-02-08: qty 50

## 2017-02-08 MED ORDER — LIDOCAINE HCL (CARDIAC) 20 MG/ML IV SOLN
INTRAVENOUS | Status: DC | PRN
  Administered 2017-02-08: 80 mg via INTRAVENOUS

## 2017-02-08 NOTE — Op Note (Signed)
[facility] Gastroenterology Patient Name: Tamara [name] Procedure Date: 02/08/2017 10:06 AM MRN: [id number] Account #: [id number] Date of Birth: Oct 28, 1945 Admit Type: Outpatient Age: 72 Room: [facility] ENDO ROOM 3 Gender: Female Note Status: Finalized Procedure:            Colonoscopy Indications:          Screening for colorectal malignant neoplasm Providers:            Lollie Sails, MD Referring MD:         Lonie Peak. Doren Custard (Referring MD) Medicines:            Monitored Anesthesia Care Complications:        No immediate complications. Procedure:            Pre-Anesthesia Assessment:                       - ASA Grade Assessment: II - A patient with mild                        systemic disease.                       After obtaining informed consent, the colonoscope was                        passed under direct vision. Throughout the procedure,                        the patient's blood pressure, pulse, and oxygen                        saturations were monitored continuously. The Olympus                        PCF-H180AL colonoscope ( S#: [id number] ) was introduced                        through the anus and advanced to the the cecum,                        identified by appendiceal orifice and ileocecal valve.                        The colonoscopy was performed without difficulty. The                        patient tolerated the procedure well. The quality of                        the bowel preparation was fair. Findings:      Two sessile polyps were found in the rectum. The polyps were 2 to 3 mm       in size. These polyps were removed with a cold snare. Resection and       retrieval were complete.      Two sessile polyps were found in the descending colon. The polyps were 3       to 4 mm in size. These polyps were removed with a cold snare. Resection       and retrieval were complete.      A 2 mm polyp was  found in the sigmoid colon. The polyp was  sessile. The       polyp was removed with a cold biopsy forceps. Resection and retrieval       were complete.      Non-bleeding internal hemorrhoids were found during retroflexion. The       hemorrhoids were small and Grade I (internal hemorrhoids that do not       prolapse).      Multiple small and large-mouthed diverticula were found in the sigmoid       colon and descending colon. Impression:           - Preparation of the colon was fair.                       - Two 2 to 3 mm polyps in the rectum, removed with a                        cold snare. Resected and retrieved.                       - Two 3 to 4 mm polyps in the descending colon, removed                        with a cold snare. Resected and retrieved.                       - One 2 mm polyp in the sigmoid colon, removed with a                        cold biopsy forceps. Resected and retrieved.                       - Non-bleeding internal hemorrhoids.                       - Diverticulosis in the sigmoid colon and in the                        descending colon. Recommendation:       - Discharge patient to home.                       - Await pathology results.                       - Soft diet today, then advance as tolerated to advance                        diet as tolerated. Procedure Code(s):    --- Professional ---                       [phone], Colonoscopy, flexible; with removal of tumor(s),                        polyp(s), or other lesion(s) by snare technique                       62694, 59, Colonoscopy, flexible; with biopsy, single  or multiple Diagnosis Code(s):    --- Professional ---                       Z12.11, Encounter for screening for malignant neoplasm                        of colon                       K64.0, First degree hemorrhoids                       K62.1, Rectal polyp                       D12.4, Benign neoplasm of descending colon                       D12.5, Benign  neoplasm of sigmoid colon                       K57.30, Diverticulosis of large intestine without                        perforation or abscess without bleeding CPT copyright 2016 American Medical Association. All rights reserved. The codes documented in this report are preliminary and upon coder review may  be revised to meet current compliance requirements. Lollie Sails, MD 02/08/2017 11:11:05 AM This report has been signed electronically. Number of Addenda: 0 Note Initiated On: 02/08/2017 10:06 AM Scope Withdrawal Time: 0 hours 18 minutes 24 seconds  Total Procedure Duration: 0 hours 31 minutes 39 seconds       [facility]

## 2017-02-08 NOTE — Anesthesia Preprocedure Evaluation (Addendum)
Anesthesia Evaluation  Patient identified by MRN, date of birth, ID band Patient awake    Reviewed: Allergy & Precautions, NPO status , Patient's Chart, lab work & pertinent test results  Airway Mallampati: II  TM Distance: <3 FB     Dental  (+) Upper Dentures, Lower Dentures   Pulmonary former smoker,    Pulmonary exam normal        Cardiovascular hypertension, Pt. on medications Normal cardiovascular exam     Neuro/Psych negative neurological ROS  negative psych ROS   GI/Hepatic Neg liver ROS, GERD  Medicated,  Endo/Other  diabetes, Well Controlled, Type 2, Oral Hypoglycemic Agents  Renal/GU negative Renal ROS     Musculoskeletal negative musculoskeletal ROS (+)   Abdominal Normal abdominal exam  (+)   Peds negative pediatric ROS (+)  Hematology negative hematology ROS (+)   Anesthesia Other Findings   Reproductive/Obstetrics                          Anesthesia Physical Anesthesia Plan  ASA: III  Anesthesia Plan: General   Post-op Pain Management:    Induction: Intravenous  PONV Risk Score and Plan:   Airway Management Planned: Nasal Cannula  Additional Equipment:   Intra-op Plan:   Post-operative Plan:   Informed Consent: I have reviewed the patients History and Physical, chart, labs and discussed the procedure including the risks, benefits and alternatives for the proposed anesthesia with the patient or authorized representative who has indicated his/her understanding and acceptance.   Dental advisory given  Plan Discussed with: CRNA and Surgeon  Anesthesia Plan Comments:         Anesthesia Quick Evaluation

## 2017-02-08 NOTE — H&P (Signed)
Outpatient short stay form Pre-procedure 02/08/2017 10:08 AM Lollie Sails MD  Primary Physician: Dena Billet PA  Reason for visit:  EGD and colonoscopy  History of present illness:  Patient is a 72 year old female presenting today as above. She has a personal history of many years of gastroesophageal refluxhat currently s refractory to her treatment. She also has complaint of epigastric pain that radiates into the right upper quadrant and at times around toward her back. She has not had evaluation of her gallbladder. She takes no aspirin or blood thinning agents.    Current Facility-Administered Medications:  .  0.9 %  sodium chloride infusion, , Intravenous, Continuous, Lollie Sails, MD, Last Rate: 20 mL/hr at 02/08/17 0944, 1,000 mL at 02/08/17 0944 .  0.9 %  sodium chloride infusion, , Intravenous, Continuous, Lollie Sails, MD  Medications Prior to Admission  Medication Sig Dispense Refill Last Dose  . Alcohol Swabs (B-D SINGLE USE SWABS REGULAR) PADS Use to check blood sugar twice daily. 200 each 5 Taking  . blood glucose meter kit and supplies KIT Dispense based on patient and insurance preference. Use up to four times daily as directed. (FOR ICD-10 E11.9). 1 each 0 Taking  . glucose blood test strip Use as instructed to check blood sugar twice daily 100 each 12 Taking  . hydrochlorothiazide (HYDRODIURIL) 25 MG tablet TAKE 1 TABLET (25 MG TOTAL) BY MOUTH DAILY. 90 tablet 0 Taking  . KLOR-CON M20 20 MEQ tablet TAKE 1 TABLET EVERY DAY 90 tablet 0 Taking  . lisinopril (PRINIVIL,ZESTRIL) 10 MG tablet TAKE 1 TABLET EVERY DAY 90 tablet 0 Taking  . metFORMIN (GLUCOPHAGE) 500 MG tablet Take 1 tablet (500 mg total) 2 (two) times daily with a meal by mouth. 180 tablet 0   . pantoprazole (PROTONIX) 40 MG tablet Take 1 tablet (40 mg total) by mouth daily. 90 tablet 3 Taking  . ranitidine (ZANTAC) 300 MG tablet Take 1 tablet (300 mg total) by mouth daily as needed. 90 tablet 0 Taking   . rosuvastatin (CRESTOR) 10 MG tablet Take 1 tablet (10 mg total) by mouth daily. 90 tablet 3 Taking     Allergies  Allergen Reactions  . Penicillins Hives     Past Medical History:  Diagnosis Date  . Diabetes mellitus without complication ([place])   . GERD (gastroesophageal reflux disease)   . Hypertension     Review of systems:      Physical Exam    Heart and lungs: Regular rate and rhythm without rub or gallop, lungs are bilaterally clear.    HEENT: Normocephalic atraumatic eyes are anicteric    Other:     Pertinant exam for procedure: Soft, tender to palpation the epigastric region extending toward the right upper quadrant. Asses or rebound. Bowel sounds are positive normoactive.    Planned proceedures: EGD, colonoscopy and indicated procedures. I have discussed the risks benefits and complications of procedures to include not limited to bleeding, infection, perforation and the risk of sedation and the patient wishes to proceed.    Lollie Sails, MD Gastroenterology 02/08/2017  10:08 AM

## 2017-02-08 NOTE — Anesthesia Post-op Follow-up Note (Signed)
Anesthesia QCDR form completed.        

## 2017-02-08 NOTE — Op Note (Signed)
[facility] Gastroenterology Patient Name: Tamara [name] Procedure Date: 02/08/2017 10:02 AM MRN: [id number] Account #: [id number] Date of Birth: Sep 13, 1945 Admit Type: Outpatient Age: 72 Room: [facility] ENDO ROOM 3 Gender: Female Note Status: Finalized Procedure:            Upper GI endoscopy Indications:          Epigastric abdominal pain, Gastro-esophageal reflux                        disease Providers:            Lollie Sails, MD Referring MD:         Lonie Peak. Doren Custard (Referring MD) Medicines:            Monitored Anesthesia Care Complications:        No immediate complications. Procedure:            Pre-Anesthesia Assessment:                       - ASA Grade Assessment: II - A patient with mild                        systemic disease.                       After obtaining informed consent, the endoscope was                        passed under direct vision. Throughout the procedure,                        the patient's blood pressure, pulse, and oxygen                        saturations were monitored continuously. The Endoscope                        was introduced through the mouth, and advanced to the                        third part of duodenum. The patient tolerated the                        procedure well. Findings:      The Z-line was irregular. Biopsies were taken with a cold forceps for       histology.      Patchy mild inflammation characterized by erosions and erythema was       found in the gastric body and in the prepyloric region of the stomach.       Biopsies were taken with a cold forceps for histology. Biopsies were       taken with a cold forceps for Helicobacter pylori testing.      The cardia and gastric fundus were normal on retroflexion otherwise.      The examined duodenum was normal. Impression:           - Z-line irregular. Biopsied.                       - Gastritis. Biopsied.                       -  Normal examined  duodenum. Recommendation:       - Await pathology results.                       - Use Protonix (pantoprazole) 40 mg PO BID for 3 weeks.                       - Use Protonix (pantoprazole) 40 mg PO daily.                       - Perform a HIDA/CCK (hepatobiliary iminodiacetic acid)                        scan at appointment to be scheduled.                       - Perform a RUQ ultrasound at appointment to be                        scheduled.                       - Return to GI clinic in 4 weeks. Procedure Code(s):    --- Professional ---                       [phone], Esophagogastroduodenoscopy, flexible, transoral;                        with biopsy, single or multiple Diagnosis Code(s):    --- Professional ---                       K22.8, Other specified diseases of esophagus                       K29.70, Gastritis, unspecified, without bleeding                       R10.13, Epigastric pain                       K21.9, Gastro-esophageal reflux disease without                        esophagitis CPT copyright 2016 American Medical Association. All rights reserved. The codes documented in this report are preliminary and upon coder review may  be revised to meet current compliance requirements. Lollie Sails, MD 02/08/2017 10:33:21 AM This report has been signed electronically. Number of Addenda: 0 Note Initiated On: 02/08/2017 10:02 AM      [facility]

## 2017-02-08 NOTE — Transfer of Care (Signed)
Immediate Anesthesia Transfer of Care Note  Patient: Tamara [name]  Procedure(s) Performed: COLONOSCOPY WITH PROPOFOL (N/A ) ESOPHAGOGASTRODUODENOSCOPY (EGD) WITH PROPOFOL (N/A )  Patient Location: PACU  Anesthesia Type:General  Level of Consciousness: awake  Airway & Oxygen Therapy: Patient Spontanous Breathing  Post-op Assessment: Report given to RN  Post vital signs: Reviewed and stable  Last Vitals:  Vitals:   02/08/17 0926  BP: 138/67  Pulse: 70  Resp: (!) 22  Temp: (!) 35.7 C  SpO2: 100%    Last Pain:  Vitals:   02/08/17 0926  TempSrc: Tympanic         Complications: No apparent anesthesia complications

## 2017-02-08 NOTE — Anesthesia Postprocedure Evaluation (Signed)
Anesthesia Post Note  Patient: Tamara [name]  Procedure(s) Performed: COLONOSCOPY WITH PROPOFOL (N/A ) ESOPHAGOGASTRODUODENOSCOPY (EGD) WITH PROPOFOL (N/A )  Patient location during evaluation: PACU Anesthesia Type: General Level of consciousness: awake and alert and oriented Pain management: pain level controlled Vital Signs Assessment: post-procedure vital signs reviewed and stable Respiratory status: spontaneous breathing Cardiovascular status: blood pressure returned to baseline Anesthetic complications: no     Last Vitals:  Vitals:   02/08/17 1130 02/08/17 1140  BP: 121/68 121/68  Pulse: 70 65  Resp: 15 15  Temp:    SpO2: 100% 100%    Last Pain:  Vitals:   02/08/17 1110  TempSrc: Tympanic                 Tatem Holsonback

## 2017-02-09 ENCOUNTER — Encounter: Payer: Self-pay | Admitting: Gastroenterology

## 2017-02-09 LAB — SURGICAL PATHOLOGY

## 2017-02-23 ENCOUNTER — Other Ambulatory Visit: Payer: Self-pay

## 2017-02-23 ENCOUNTER — Ambulatory Visit (INDEPENDENT_AMBULATORY_CARE_PROVIDER_SITE_OTHER): Payer: Medicare HMO | Admitting: Physician Assistant

## 2017-02-23 ENCOUNTER — Encounter: Payer: Self-pay | Admitting: Physician Assistant

## 2017-02-23 VITALS — BP 118/70 | HR 73 | Temp 97.7°F | Resp 14 | Wt 209.2 lb

## 2017-02-23 DIAGNOSIS — E785 Hyperlipidemia, unspecified: Secondary | ICD-10-CM

## 2017-02-23 DIAGNOSIS — Z1212 Encounter for screening for malignant neoplasm of rectum: Secondary | ICD-10-CM

## 2017-02-23 DIAGNOSIS — K219 Gastro-esophageal reflux disease without esophagitis: Secondary | ICD-10-CM | POA: Diagnosis not present

## 2017-02-23 DIAGNOSIS — E119 Type 2 diabetes mellitus without complications: Secondary | ICD-10-CM | POA: Diagnosis not present

## 2017-02-23 DIAGNOSIS — Z1211 Encounter for screening for malignant neoplasm of colon: Secondary | ICD-10-CM | POA: Diagnosis not present

## 2017-02-23 DIAGNOSIS — R600 Localized edema: Secondary | ICD-10-CM

## 2017-02-23 DIAGNOSIS — I10 Essential (primary) hypertension: Secondary | ICD-10-CM | POA: Diagnosis not present

## 2017-02-23 NOTE — Progress Notes (Addendum)
Patient ID: IISHA [name] MRN: [id number], DOB: 1945/05/30, 72 y.o. Date of Encounter: _0 @  Chief Complaint:  No chief complaint on file.   HPI: 72 y.o. year old female     07/14/2016: presents As a new patient to establish care.  Her husband sees me and I'm his primary care provider. She states that she had been seeing a PCP in [place]. Says that her last routine office visit there was almost 3 months ago says that her 3 month follow-up is due July 9th.  She says that "he will refer me for anything "and "he doesn't believe in colonoscopy ".  Says that her diabetes was diagnosed in 2008.  Is that she does have a blood pressure cuff at home. Says that she hasn't taken the lisinopril for the past 3 days because her blood pressure was reading low and she was feeling weak.  Reviewed that her medication list states Lasix 40 mg as needed. I asked how often she takes this on average and she says that she takes this every 2-3 days.  Says that she did have DEXA scan this year which was normal. Says she does have mammogram every year. Says that she has flu shot and has had pneumonia shot. Says that she doesn't need a shingles vaccine because she never had chickenpox.  Says that she takes Protonix every day and still feels indigestion symptoms. Has to put take Protonix in addition to Alka-Seltzer and Tums to try to get relief. Says that she informed her prior PCP about this multiple times and he kept mentioning doing a referral but never did do this.  Also she is interested in/agreeable to have colonoscopy for colorectal cancer screening.  She knows of no contraindication for her to take aspirin. Has had no diagnosed stomach ulcer or bleeding.  She is not fasting today.  No other specific concerns to address today.    AT THAT OV:  She states that it has not been quite 3 months since her last visit with prior PCP. Also she is not fasting today. I will have her return  for follow-up visit at the end of July and will have her come fasting to that appointment.   Currently her Lasix is 40 mg every 2 days. It would make a lot more sense for her to take 20 mg on a daily basis or even changed to HCTZ. At this time I'll discontinue the Lasix and will change to HCTZ 25 mg daily. She also reports that she stopped taking the lisinopril for the past 3 days because she was feeling weak and lightheaded and her blood pressure was reading low. I wonder if she had taken the Lasix on that day when she was feeling flat weak and lightheaded and her blood pressure was reading low. Think that if we stop this high-dose Lasix intermittent dosing then she can tolerate ACE inhibitor which she needs given her diabetes. Therefore changing the Lasix to HCTZ 25 mg daily and will have her try to resume the lisinopril.  She is on protonix but symptoms are not well controlled with this. Needs follow-up with GI for possible endoscopy. - Ambulatory referral to Gastroenterology  She has had no colonoscopy but is agreeable to have this for colorectal cancer screening. - Ambulatory referral to Gastroenterology    08/23/2016: Last office visit I also recommended her start aspirin. She states that she is not going to start the aspirin until she has her evaluation with GI. Otherwise  she did follow through with everything that I had recommended at last visit. She is fasting today for lab. She is able to leave a urine sample today. She did stop the Lasix. She did start the HCTZ daily and says that the swelling is "fine". She has been able to resume the lisinopril and is taking this daily and is no longer having any lightheadedness or other adverse effects. Says that all that resolved since we changed the Lasix to HCTZ. She states that she did get her appointment with GI as scheduled and its for next Monday one week from today. She reports that she does have diabetic eye exam annually. Says the last  one was around November or December 2017. Says "got new glasses then too ". She reports that her prior PCP had educated her regarding diabetic foot care. She has no other specific concerns to address today.   11/22/2016: Today I reviewed that labs from last visit 08/23/2016 showed A1c 5.4 and I recommended for her to stop the glimepiride/Amaryl.   Today I verified with her that she did stop this medication as directed. She reports that she frequently has problems with ingrown toenails on both of her first toes.  Says that she digs them out herself but is concerned about these, given her diabetes.  Also states that those toes frequently hurt when wearing shoes.  She has no other specific concerns to address today. She is taking blood pressure medications as directed.  No lightheadedness or other adverse effects. She is taking her metformin as directed.  No adverse effects. She is taking her Crestor as directed.  No myalgias or other adverse effects.   02/23/2017: She is taking blood pressure medications as directed.  No lightheadedness or other adverse effects. She is taking her metformin as directed.  No adverse effects. She is taking her Crestor as directed.  No myalgias or other adverse effects. I did review her chart -- she had endoscopy and colonoscopy performed 02/08/17.  However I did not read those reports.   She states that the colonoscopy shows 5 polyps.  However at this point she does not know when she is supposed to do repeat colonoscopy.   She states that they did a biopsy of her stomach and that she has a follow-up office visit 3 weeks and will find out results from that biopsy at that visit.   Says that she is scheduled for ultrasound and another test-- says "he thinks it is my gallbladder" States that she did see podiatry for 2 visits since her last visit with me and they have fixed the areas with her toenails.   At this time does not have any further appointment scheduled at  podiatry.  Will follow-up with them PRN. She has no specific concerns to address today.  Things are stable from her standpoint.   Addendum Added on 03/31/2017: Received note from [facility] with surgical pathology report. Colon polyp showed tubular adenoma.  Repeat colonoscopy 5 years. Report also showed specimens from stomach/from EGD procedure.  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  Findings consistent with reflux gastroesophagitis.  One polyps were negative for dysplasia and malignancy.   Overall---- Repeat Colonoscopy 5 years    Past Medical History:  Diagnosis Date  . Diabetes mellitus without complication ([place])   . GERD (gastroesophageal reflux disease)   . Hypertension      Home Meds: Outpatient Medications Prior to Visit  Medication Sig Dispense Refill  . Alcohol  Swabs (B-D SINGLE USE SWABS REGULAR) PADS Use to check blood sugar twice daily. 200 each 5  . blood glucose meter kit and supplies KIT Dispense based on patient and insurance preference. Use up to four times daily as directed. (FOR ICD-10 E11.9). 1 each 0  . glucose blood test strip Use as instructed to check blood sugar twice daily 100 each 12  . hydrochlorothiazide (HYDRODIURIL) 25 MG tablet TAKE 1 TABLET (25 MG TOTAL) BY MOUTH DAILY. 90 tablet 0  . KLOR-CON M20 20 MEQ tablet TAKE 1 TABLET EVERY DAY 90 tablet 0  . lisinopril (PRINIVIL,ZESTRIL) 10 MG tablet TAKE 1 TABLET EVERY DAY 90 tablet 0  . metFORMIN (GLUCOPHAGE) 1000 MG tablet TAKE 1 TABLET TWICE DAILY 180 tablet 0  . pantoprazole (PROTONIX) 40 MG tablet Take 1 tablet (40 mg total) by mouth daily. 90 tablet 3  . ranitidine (ZANTAC) 300 MG tablet Take 1 tablet (300 mg total) by mouth daily as needed. 90 tablet 0  . rosuvastatin (CRESTOR) 10 MG tablet Take 1 tablet (10 mg total) by mouth daily. 90 tablet 3  . aspirin 81 MG tablet Take 1 tablet (81 mg total) by mouth daily. 90 tablet 11  .    0   No  facility-administered medications prior to visit.     Allergies:  Allergies  Allergen Reactions  . Penicillins Hives    Social History   Socioeconomic History  . Marital status: Married    Spouse name: Not on file  . Number of children: Not on file  . Years of education: Not on file  . Highest education level: Not on file  Social Needs  . Financial resource strain: Not on file  . Food insecurity - worry: Not on file  . Food insecurity - inability: Not on file  . Transportation needs - medical: Not on file  . Transportation needs - non-medical: Not on file  Occupational History  . Not on file  Tobacco Use  . Smoking status: Former Research scientist (life sciences)  . Smokeless tobacco: Never Used  Substance and Sexual Activity  . Alcohol use: No  . Drug use: No  . Sexual activity: Not on file  Other Topics Concern  . Not on file  Social History Narrative  . Not on file    Family History  Problem Relation Age of Onset  . Breast cancer Neg Hx      Review of Systems:  See HPI for pertinent ROS. All other ROS negative.    Physical Exam: Blood pressure 118/70, pulse 73, temperature 97.7 F (36.5 C), temperature source Oral, resp. rate 14, weight 94.9 kg (209 lb 3.2 oz), SpO2 99 %., There is no height or weight on file to calculate BMI. General: WF. Appears in no acute distress. Neck: Supple. No thyromegaly. No lymphadenopathy.No carotid bruit. Lungs: Clear bilaterally to auscultation without wheezes, rales, or rhonchi. Breathing is unlabored. Heart: RRR with S1 S2. No murmurs, rubs, or gallops. Abdomen: Soft, non-tender, non-distended with normoactive bowel sounds. No hepatomegaly. No rebound/guarding. No obvious abdominal masses. Musculoskeletal:  Strength and tone normal for age. Extremities/Skin: Warm and dry.  No LE edema. Diabetic foot exam:  inspection is normal.No palpable DP or PT pulses bilaterally. Sensation intact. Neuro: Alert and oriented X 3. Moves all extremities spontaneously.  Gait is normal. CNII-XII grossly in tact. Psych:  Responds to questions appropriately with a normal affect.     ASSESSMENT AND PLAN:  72 y.o. year old female with     Type  2 diabetes mellitus without complication, without long-term current use of insulin ([place])  She is on metformin.  She is on ACE inhibitor. She is on statin. We'll have her add baby aspirin daily.----08/23/2016-----She will wait to start after she has eval at GI - aspirin 81 MG tablet; Take 1 tablet (81 mg total) by mouth daily.  Dispense: 30 tablet; Refill: 11  She does have annual eye exam. I told her that when she goes for her next eye exam to have them list as is her PCP and send copy of note. She has been educated regarding diabetic foot care.  08/23/2016 A1C, MicroAlbumin, CMET 11/22/2016: Check A1C ------------referral ordered for Podiatry  02/23/2017: Check A1C 02/23/2017: Wait to decide about adding aspirin after GI evaluation complete   Hyperlipidemia, unspecified hyperlipidemia type She is on Crestor.  08/23/2016: She is fasting. Check FLP LFT now. 11/22/2016: Lipid panel was checked 08/23/16.  LDL was 95.  Continue current dose Crestor. 02/23/2017: Check FLP/LFT   Essential hypertension 02/23/2017:  : Blood Pressure is at goal. She is on ACE inhibitor. Check lab to monitor every 6 months.    Bilateral lower extremity edema 08/23/16: At last visit changed her Lasix intermittent dosing to HCTZ daily. She states that this is working well on her edema is controlled. Check lab to monitor. 02/23/2017: : Lower Extremity edema is controlled.  Continue HCTZ 25 mg daily.  Gastroesophageal reflux disease, esophagitis presence not specified 08/23/16: at last visit I put in referral to GI. She states that her appointment as scheduled 1 week from today. 11/22/2016: She states that her appointment with GI is not until January. 02/23/2017: She had endoscopy and colonoscopy on 02/08/2017.  See details in HPI.  She has follow-up  scheduled with GI in several weeks.  Screening for colorectal cancer 08/23/16: at last visit I put in referral to GI. She states that her appointment as scheduled 1 week from today. 11/22/2016: She states that her appointment with GI is not until January. 02/23/2017: She had endoscopy and colonoscopy on 02/08/2017.See details in HPI.  She has follow-up scheduled with GI in several weeks. Addendum Added on 03/31/2017: Received note from [facility] with surgical pathology report. Colon polyp showed tubular adenoma.  Repeat colonoscopy 5 years. Report also showed specimens from stomach/from EGD procedure.  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  Findings consistent with reflux gastroesophagitis.  One polyps were negative for dysplasia and malignancy.   Overall---- Repeat Colonoscopy 5 years       She states that she has mammogram every year.  She states that she had DEXA scan this past year and reports that this was normal.  She reports that she has flu shots and has had a pneumonia shot. Says that she never had chickenpox so does not need shingles vaccine. Will further discuss preventative care at follow-up visit.   Plan for routine follow-up visit in 3 months. Sooner if needed.   Marin Olp Mar[place], [place], [facility] 02/23/2017 7:54 AM

## 2017-02-24 LAB — COMPLETE METABOLIC PANEL WITH GFR
AG Ratio: 1.8 (calc) (ref 1.0–2.5)
ALBUMIN MSPROF: 4.3 g/dL (ref 3.6–5.1)
ALKALINE PHOSPHATASE (APISO): 50 U/L (ref 33–130)
ALT: 10 U/L (ref 6–29)
AST: 14 U/L (ref 10–35)
BUN/Creatinine Ratio: 18 (calc) (ref 6–22)
BUN: 17 mg/dL (ref 7–25)
CALCIUM: 9.8 mg/dL (ref 8.6–10.4)
CO2: 29 mmol/L (ref 20–32)
CREATININE: 0.97 mg/dL — AB (ref 0.60–0.93)
Chloride: 103 mmol/L (ref 98–110)
GFR, EST AFRICAN AMERICAN: 69 mL/min/{1.73_m2} (ref >=60)
GFR, EST NON AFRICAN AMERICAN: 59 mL/min/{1.73_m2} — AB (ref >=60)
GLUCOSE: 131 mg/dL — AB (ref 65–99)
Globulin: 2.4 g/dL (calc) (ref 1.9–3.7)
Potassium: 4.1 mmol/L (ref 3.5–5.3)
Sodium: 139 mmol/L (ref 135–146)
TOTAL PROTEIN: 6.7 g/dL (ref 6.1–8.1)
Total Bilirubin: 0.4 mg/dL (ref 0.2–1.2)

## 2017-02-24 LAB — LIPID PANEL
CHOL/HDL RATIO: 3.7 (calc) (ref <5.0)
CHOLESTEROL: 176 mg/dL (ref <200)
HDL: 48 mg/dL — ABNORMAL LOW (ref >50)
LDL CHOLESTEROL (CALC): 102 mg/dL — AB
Non-HDL Cholesterol (Calc): 128 mg/dL (calc) (ref <130)
Triglycerides: 165 mg/dL — ABNORMAL HIGH (ref <150)

## 2017-02-24 LAB — HEMOGLOBIN A1C
EAG (MMOL/L): 7.6 (calc)
Hgb A1c MFr Bld: 6.4 % of total Hgb — ABNORMAL HIGH (ref <5.7)
Mean Plasma Glucose: 137 (calc)

## 2017-02-25 ENCOUNTER — Other Ambulatory Visit: Payer: Self-pay

## 2017-02-25 ENCOUNTER — Other Ambulatory Visit: Payer: Self-pay | Admitting: Gastroenterology

## 2017-02-25 DIAGNOSIS — E785 Hyperlipidemia, unspecified: Secondary | ICD-10-CM

## 2017-02-25 DIAGNOSIS — R1011 Right upper quadrant pain: Secondary | ICD-10-CM

## 2017-02-25 MED ORDER — ROSUVASTATIN CALCIUM 20 MG PO TABS: 20.0000 mg | ORAL_TABLET | Freq: Every day | ORAL | 0 refills | Status: DC

## 2017-03-09 ENCOUNTER — Ambulatory Visit
Admission: RE | Admit: 2017-03-09 | Discharge: 2017-03-09 | Disposition: A | Discharge status: Home / Self Care | Payer: Medicare HMO | Source: Ambulatory Visit | Attending: Gastroenterology | Admitting: Gastroenterology

## 2017-03-09 ENCOUNTER — Encounter
Admission: RE | Admit: 2017-03-09 | Discharge: 2017-03-09 | Disposition: A | Discharge status: Home / Self Care | Payer: Medicare HMO | Source: Ambulatory Visit | Attending: Gastroenterology | Admitting: Gastroenterology

## 2017-03-09 DIAGNOSIS — R932 Abnormal findings on diagnostic imaging of liver and biliary tract: Secondary | ICD-10-CM | POA: Diagnosis not present

## 2017-03-09 DIAGNOSIS — K7689 Other specified diseases of liver: Secondary | ICD-10-CM | POA: Diagnosis not present

## 2017-03-09 DIAGNOSIS — R1011 Right upper quadrant pain: Secondary | ICD-10-CM | POA: Insufficient documentation

## 2017-03-09 HISTORY — DX: Right upper quadrant pain: R10.11

## 2017-03-09 MED ORDER — TECHNETIUM TC 99M MEBROFENIN IV KIT
5.4500 | PACK | Freq: Once | INTRAVENOUS | Status: AC | PRN
  Administered 2017-03-09: 5.45 via INTRAVENOUS

## 2017-03-16 DIAGNOSIS — R935 Abnormal findings on diagnostic imaging of other abdominal regions, including retroperitoneum: Secondary | ICD-10-CM | POA: Diagnosis not present

## 2017-03-16 DIAGNOSIS — R1013 Epigastric pain: Secondary | ICD-10-CM | POA: Diagnosis not present

## 2017-03-16 DIAGNOSIS — K7460 Unspecified cirrhosis of liver: Secondary | ICD-10-CM | POA: Diagnosis not present

## 2017-04-07 ENCOUNTER — Other Ambulatory Visit: Payer: Self-pay | Admitting: Physician Assistant

## 2017-04-07 DIAGNOSIS — R600 Localized edema: Secondary | ICD-10-CM

## 2017-04-08 ENCOUNTER — Other Ambulatory Visit: Payer: Medicare HMO

## 2017-04-08 DIAGNOSIS — E785 Hyperlipidemia, unspecified: Secondary | ICD-10-CM

## 2017-04-08 LAB — HEPATIC FUNCTION PANEL
AG RATIO: 1.8 (calc) (ref 1.0–2.5)
ALBUMIN MSPROF: 4.3 g/dL (ref 3.6–5.1)
ALT: 13 U/L (ref 6–29)
AST: 17 U/L (ref 10–35)
Alkaline phosphatase (APISO): 47 U/L (ref 33–130)
BILIRUBIN TOTAL: 0.4 mg/dL (ref 0.2–1.2)
Bilirubin, Direct: 0.1 mg/dL (ref 0.0–0.2)
GLOBULIN: 2.4 g/dL (ref 1.9–3.7)
Indirect Bilirubin: 0.3 mg/dL (calc) (ref 0.2–1.2)
Total Protein: 6.7 g/dL (ref 6.1–8.1)

## 2017-04-08 LAB — LIPID PANEL
CHOL/HDL RATIO: 3.2 (calc) (ref <5.0)
CHOLESTEROL: 152 mg/dL (ref <200)
HDL: 48 mg/dL — AB (ref >50)
LDL Cholesterol (Calc): 79 mg/dL (calc)
Non-HDL Cholesterol (Calc): 104 mg/dL (calc) (ref <130)
Triglycerides: 150 mg/dL — ABNORMAL HIGH (ref <150)

## 2017-04-12 ENCOUNTER — Other Ambulatory Visit: Payer: Self-pay

## 2017-04-12 MED ORDER — ROSUVASTATIN CALCIUM 20 MG PO TABS: 20.0000 mg | ORAL_TABLET | Freq: Every day | ORAL | 1 refills | Status: DC

## 2017-04-12 MED ORDER — PANTOPRAZOLE SODIUM 40 MG PO TBEC: 40.0000 mg | DELAYED_RELEASE_TABLET | Freq: Every day | ORAL | 1 refills | Status: DC

## 2017-04-12 MED ORDER — RANITIDINE HCL 300 MG PO TABS: 300.0000 mg | ORAL_TABLET | Freq: Every day | ORAL | 1 refills | Status: DC | PRN

## 2017-04-12 MED ORDER — GLUCOSE BLOOD VI STRP: ORAL_STRIP | 12 refills | Status: DC

## 2017-04-12 MED ORDER — METFORMIN HCL 500 MG PO TABS: 500.0000 mg | ORAL_TABLET | Freq: Two times a day (BID) | ORAL | 1 refills | Status: DC

## 2017-04-12 MED ORDER — BD SWAB SINGLE USE REGULAR PADS: MEDICATED_PAD | 5 refills | Status: DC

## 2017-04-12 MED ORDER — POTASSIUM CHLORIDE CRYS ER 20 MEQ PO TBCR: 20.0000 meq | EXTENDED_RELEASE_TABLET | Freq: Every day | ORAL | 1 refills | Status: DC

## 2017-05-26 ENCOUNTER — Other Ambulatory Visit: Payer: Self-pay

## 2017-05-26 ENCOUNTER — Ambulatory Visit (INDEPENDENT_AMBULATORY_CARE_PROVIDER_SITE_OTHER): Payer: Medicare HMO | Admitting: Physician Assistant

## 2017-05-26 ENCOUNTER — Encounter: Payer: Self-pay | Admitting: Physician Assistant

## 2017-05-26 VITALS — BP 124/62 | HR 67 | Temp 97.6°F | Resp 16 | Ht 65.0 in | Wt 214.6 lb

## 2017-05-26 DIAGNOSIS — K219 Gastro-esophageal reflux disease without esophagitis: Secondary | ICD-10-CM

## 2017-05-26 DIAGNOSIS — E119 Type 2 diabetes mellitus without complications: Secondary | ICD-10-CM

## 2017-05-26 DIAGNOSIS — R5383 Other fatigue: Secondary | ICD-10-CM

## 2017-05-26 DIAGNOSIS — R600 Localized edema: Secondary | ICD-10-CM

## 2017-05-26 DIAGNOSIS — Z1321 Encounter for screening for nutritional disorder: Secondary | ICD-10-CM | POA: Diagnosis not present

## 2017-05-26 DIAGNOSIS — D649 Anemia, unspecified: Secondary | ICD-10-CM | POA: Diagnosis not present

## 2017-05-26 DIAGNOSIS — E785 Hyperlipidemia, unspecified: Secondary | ICD-10-CM

## 2017-05-26 DIAGNOSIS — I10 Essential (primary) hypertension: Secondary | ICD-10-CM

## 2017-05-26 DIAGNOSIS — Z130 Encounter for screening for diseases of the blood and blood-forming organs and certain disorders involving the immune mechanism: Secondary | ICD-10-CM | POA: Diagnosis not present

## 2017-05-26 MED ORDER — METFORMIN HCL 1000 MG PO TABS: 1000.0000 mg | ORAL_TABLET | Freq: Two times a day (BID) | ORAL | 11 refills | Status: DC

## 2017-05-26 NOTE — Progress Notes (Signed)
Patient ID: Tamara [name] MRN: [id number], DOB: 09-18-1945, 72 y.o. Date of Encounter: '@DATE'$ @  Chief Complaint:  No chief complaint on file.   HPI: 72 y.o. year old female     07/14/2016: Presents as a new patient to establish care.  Her husband sees me and I'm his primary care provider. She states that she had been seeing a PCP in [place]. Says that her last routine office visit there was almost 3 months ago says that her 3 month follow-up is due July 9th.  She says that "he will refer me for anything "and "he doesn't believe in colonoscopy ".  Says that her diabetes was diagnosed in 2008.  Is that she does have a blood pressure cuff at home. Says that she hasn't taken the lisinopril for the past 3 days because her blood pressure was reading low and she was feeling weak.  Reviewed that her medication list states Lasix 40 mg as needed. I asked how often she takes this on average and she says that she takes this every 2-3 days.  Says that she did have DEXA scan this year which was normal. Says she does have mammogram every year. Says that she has flu shot and has had pneumonia shot. Says that she doesn't need a shingles vaccine because she never had chickenpox.  Says that she takes Protonix every day and still feels indigestion symptoms. Has to put take Protonix in addition to Alka-Seltzer and Tums to try to get relief. Says that she informed her prior PCP about this multiple times and he kept mentioning doing a referral but never did do this.  Also she is interested in/agreeable to have colonoscopy for colorectal cancer screening.  She knows of no contraindication for her to take aspirin. Has had no diagnosed stomach ulcer or bleeding.  She is not fasting today.  No other specific concerns to address today.    AT THAT OV:  She states that it has not been quite 3 months since her last visit with prior PCP. Also she is not fasting today. I will have her return  for follow-up visit at the end of July and will have her come fasting to that appointment.   Currently her Lasix is 40 mg every 2 days. It would make a lot more sense for her to take 20 mg on a daily basis or even changed to HCTZ. At this time I'll discontinue the Lasix and will change to HCTZ 25 mg daily. She also reports that she stopped taking the lisinopril for the past 3 days because she was feeling weak and lightheaded and her blood pressure was reading low. I wonder if she had taken the Lasix on that day when she was feeling flat weak and lightheaded and her blood pressure was reading low. Think that if we stop this high-dose Lasix intermittent dosing then she can tolerate ACE inhibitor which she needs given her diabetes. Therefore changing the Lasix to HCTZ 25 mg daily and will have her try to resume the lisinopril.  She is on protonix but symptoms are not well controlled with this. Needs follow-up with GI for possible endoscopy. - Ambulatory referral to Gastroenterology  She has had no colonoscopy but is agreeable to have this for colorectal cancer screening. - Ambulatory referral to Gastroenterology    08/23/2016: Last office visit I also recommended her start aspirin. She states that she is not going to start the aspirin until she has her evaluation with GI. Otherwise  she did follow through with everything that I had recommended at last visit. She is fasting today for lab. She is able to leave a urine sample today. She did stop the Lasix. She did start the HCTZ daily and says that the swelling is "fine". She has been able to resume the lisinopril and is taking this daily and is no longer having any lightheadedness or other adverse effects. Says that all that resolved since we changed the Lasix to HCTZ. She states that she did get her appointment with GI is scheduled and its for next Monday one week from today. She reports that she does have diabetic eye exam annually. Says the last  one was around November or December 2017. Says "got new glasses then too ". She reports that her prior PCP had educated her regarding diabetic foot care. She has no other specific concerns to address today.   11/22/2016: Today I reviewed that labs from last visit 08/23/2016 showed A1c 5.4 and I recommended for her to stop the glimepiride/Amaryl.   Today I verified with her that she did stop this medication as directed. She reports that she frequently has problems with ingrown toenails on both of her first toes.  Says that she digs them out herself but is concerned about these, given her diabetes.  Also states that those toes frequently hurt when wearing shoes.  She has no other specific concerns to address today. She is taking blood pressure medications as directed.  No lightheadedness or other adverse effects. She is taking her metformin as directed.  No adverse effects. She is taking her Crestor as directed.  No myalgias or other adverse effects.   02/23/2017: She is taking blood pressure medications as directed.  No lightheadedness or other adverse effects. She is taking her metformin as directed.  No adverse effects. She is taking her Crestor as directed.  No myalgias or other adverse effects. I did review her chart -- she had endoscopy and colonoscopy performed 02/08/17.  However I did not read those reports.   She states that the colonoscopy shows 5 polyps.  However at this point she does not know when she is supposed to do repeat colonoscopy.   She states that they did a biopsy of her stomach and that she has a follow-up office visit 3 weeks and will find out results from that biopsy at that visit.   Says that she is scheduled for ultrasound and another test-- says "he thinks it is my gallbladder" States that she did see podiatry for 2 visits since her last visit with me and they have fixed the areas with her toenails.   At this time does not have any further appointment scheduled at  podiatry.  Will follow-up with them PRN. She has no specific concerns to address today.  Things are stable from her standpoint.   Addendum Added on 03/31/2017: Received note from [facility] with surgical pathology report. Colon polyp showed tubular adenoma.  Repeat colonoscopy 5 years. Report also showed specimens from stomach/from EGD procedure.  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  Findings consistent with reflux gastroesophagitis.  One polyps were negative for dysplasia and malignancy.   Overall---- Repeat Colonoscopy 5 years   05/26/2017: She states that I had decreased the metformin from 1000 twice a day to 500 mg twice a day.  States that when she did that-- her blood sugar went up.  Therefore-- on her own--- she has increased back to taking the 1000  mg twice a day.  States that she took the lower dose 500 twice a day for only about 1 month and has been taking the 1000 twice daily for about 2 months now.    She is having no GI side effects.  No other adverse effects. She is taking the Crestor daily.  She said this is causing no myalgias or other adverse effects. She is taking the lisinopril daily.  She is having no lightheadedness or other adverse effects. She is taking HCTZ daily.  This is controlling her lower extremity edema.  Is causing no lightheadedness or other adverse effects. She also brings in one additional medicine bottle and states that that is what GI added.  We have added this to her medicine list today.  They added sucralfate. She states that "I stay tired all the time.  Have no energy to do anything.  Of got a lot to do, but do not have the energy to do it.  "I asked if she feels depressed.  She says no I am not depressed.  Says that she has all these things that she wants to do but just does not have the energy. No other specific complaints/concerns today.    Past Medical History:  Diagnosis Date  . Diabetes mellitus without  complication ([place])   . GERD (gastroesophageal reflux disease)   . Hypertension   . RUQ pain 03/09/2017   Per patient, she has had RUQ pain 4-5 years that has grown worse in the last few months.     Home Meds: Outpatient Medications Prior to Visit  Medication Sig Dispense Refill  . Alcohol Swabs (B-D SINGLE USE SWABS REGULAR) PADS Use to check blood sugar twice daily. 200 each 5  . blood glucose meter kit and supplies KIT Dispense based on patient and insurance preference. Use up to four times daily as directed. (FOR ICD-10 E11.9). 1 each 0  . glucose blood test strip Use as instructed to check blood sugar twice daily 100 each 12  . hydrochlorothiazide (HYDRODIURIL) 25 MG tablet TAKE 1 TABLET (25 MG TOTAL) BY MOUTH DAILY. 90 tablet 0  . KLOR-CON M20 20 MEQ tablet TAKE 1 TABLET EVERY DAY 90 tablet 0  . lisinopril (PRINIVIL,ZESTRIL) 10 MG tablet TAKE 1 TABLET EVERY DAY 90 tablet 0  . metFORMIN (GLUCOPHAGE) 1000 MG tablet TAKE 1 TABLET TWICE DAILY 180 tablet 0  . pantoprazole (PROTONIX) 40 MG tablet Take 1 tablet (40 mg total) by mouth daily. 90 tablet 3  . ranitidine (ZANTAC) 300 MG tablet Take 1 tablet (300 mg total) by mouth daily as needed. 90 tablet 0  . rosuvastatin (CRESTOR) 10 MG tablet Take 1 tablet (10 mg total) by mouth daily. 90 tablet 3  . aspirin 81 MG tablet Take 1 tablet (81 mg total) by mouth daily. 90 tablet 11  .    0   No facility-administered medications prior to visit.     Allergies:  Allergies  Allergen Reactions  . Penicillins Hives    Social History   Socioeconomic History  . Marital status: Married    Spouse name: Not on file  . Number of children: Not on file  . Years of education: Not on file  . Highest education level: Not on file  Occupational History  . Not on file  Social Needs  . Financial resource strain: Not on file  . Food insecurity:    Worry: Not on file    Inability: Not on file  . Transportation  needs:    Medical: Not on file     Non-medical: Not on file  Tobacco Use  . Smoking status: Former Research scientist (life sciences)  . Smokeless tobacco: Never Used  Substance and Sexual Activity  . Alcohol use: No  . Drug use: No  . Sexual activity: Not on file  Lifestyle  . Physical activity:    Days per week: Not on file    Minutes per session: Not on file  . Stress: Not on file  Relationships  . Social connections:    Talks on phone: Not on file    Gets together: Not on file    Attends religious service: Not on file    Active member of club or organization: Not on file    Attends meetings of clubs or organizations: Not on file    Relationship status: Not on file  . Intimate partner violence:    Fear of current or ex partner: Not on file    Emotionally abused: Not on file    Physically abused: Not on file    Forced sexual activity: Not on file  Other Topics Concern  . Not on file  Social History Narrative  . Not on file    Family History  Problem Relation Age of Onset  . Breast cancer Neg Hx      Review of Systems:  See HPI for pertinent ROS. All other ROS negative.    Physical Exam: Blood pressure 124/62, pulse 67, temperature 97.6 F (36.4 C), temperature source Oral, resp. rate 16, height '5\' 5"'$  (1.651 m), weight 97.3 kg (214 lb 9.6 oz), SpO2 97 %., Body mass index is 35.71 kg/m. General: Obese WF. Appears in no acute distress. Neck: Supple. No thyromegaly. No lymphadenopathy.No carotid bruits.  Lungs: Clear bilaterally to auscultation without wheezes, rales, or rhonchi. Breathing is unlabored. Heart: RRR with S1 S2. No murmurs, rubs, or gallops. Abdomen: Soft, non-tender, non-distended with normoactive bowel sounds. No hepatomegaly. No rebound/guarding. No obvious abdominal masses. Musculoskeletal:  Strength and tone normal for age. Extremities/Skin: Warm and dry.  No edema.  Diabetic Foot Exam: Inspection is normal.  Sensaation is intact.  No palpable DP or PT pulses bilaterally. Neuro: Alert and oriented X 3. Moves  all extremities spontaneously. Gait is normal. CNII-XII grossly in tact. Psych:  Responds to questions appropriately with a normal affect.     ASSESSMENT AND PLAN:  72 y.o. year old female with   Fatigue, unspecified type 05/26/2017: We will check labs for underlying anemia or hypothyroid to be causing symptoms.  Discussed that if these labs come back normal then there is no underlying medical condition for me to address.  She reports that she definitely does not feel depressed. - CBC with Differential/Platelet - TSH  No energy 05/26/2017: We will check labs for underlying anemia or hypothyroid to be causing symptoms.  Discussed that if these labs come back normal then there is no underlying medical condition for me to address.  She reports that she definitely does not feel depressed. - CBC with Differential/Platelet - TSH   Type 2 diabetes mellitus without complication, without long-term current use of insulin ([place])  She is on metformin.  She is on ACE inhibitor. She is on statin. We'll have her add baby aspirin daily.----08/23/2016-----She will wait to start after she has eval at GI - aspirin 81 MG tablet; Take 1 tablet (81 mg total) by mouth daily.  Dispense: 30 tablet; Refill: 11  She does have annual eye exam. I told  her that when she goes for her next eye exam to have them list as is her PCP and send copy of note. She has been educated regarding diabetic foot care.  08/23/2016 A1C, MicroAlbumin, CMET 11/22/2016: Check A1C ------------referral ordered for Podiatry  02/23/2017: Check A1C 02/23/2017: Wait to decide about adding aspirin after GI evaluation complete  05/26/2017: She reports that when she decreased metformin from 1000 twice daily to 500 twice daily this caused her sugars to go up since she has gone back to taking 1000 twice daily for the past 2 months.  Will recheck A1c today to monitor.  Hyperlipidemia, unspecified hyperlipidemia type She is on Crestor.  08/23/2016: She is  fasting. Check FLP LFT now. 11/22/2016: Lipid panel was checked 08/23/16.  LDL was 95.  Continue current dose Crestor. 02/23/2017: Check FLP/LFT 05/26/2017: Had follow-up FLP/LFT 04/08/2017.  LDL was at 79.  She was to continue current dose Crestor 20 mg.  Will wait to recheck these labs.  Essential hypertension 05/26/2017:  : Blood Pressure is at goal. She is on ACE inhibitor. Check lab to monitor every 6 months.    Bilateral lower extremity edema 08/23/16: At last visit changed her Lasix intermittent dosing to HCTZ daily. She states that this is working well on her edema is controlled. Check lab to monitor. 5/92019: : Lower Extremity edema is controlled.  Continue HCTZ 25 mg daily.  Gastroesophageal reflux disease, esophagitis presence not specified 08/23/16: at last visit I put in referral to GI. She states that her appointment as scheduled 1 week from today. 11/22/2016: She states that her appointment with GI is not until January. 5/92019: She had endoscopy and colonoscopy on 02/08/2017.  See details in HPI.  She has follow-up scheduled with GI in several weeks.  Gastroesophagitis: 05/26/2017: Endoscopy showed gastroesophagitis.  At visit 05/26/2017 she reports that GI added sucralfate.  She is taking this as directed.  Screening for colorectal cancer 08/23/16: at last visit I put in referral to GI. She states that her appointment as scheduled 1 week from today. 11/22/2016: She states that her appointment with GI is not until January. 02/23/2017: She had endoscopy and colonoscopy on 02/08/2017.See details in HPI.  She has follow-up scheduled with GI in several weeks. Addendum Added on 03/31/2017: Received note from [facility] with surgical pathology report. Colon polyp showed tubular adenoma.  Repeat colonoscopy 5 years. Report also showed specimens from stomach/from EGD procedure.  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  Findings consistent with reflux  gastroesophagitis.  One polyps were negative for dysplasia and malignancy.   Overall---- Repeat Colonoscopy 5 years       She states that she has mammogram every year.  She states that she had DEXA scan this past year and reports that this was normal.  She reports that she has flu shots and has had a pneumonia shot. Says that she never had chickenpox so does not need shingles vaccine. Will further discuss preventative care at follow-up visit.   Plan for routine follow-up visit in 3 months. Sooner if needed.   Marin Olp [place], [place], [facility] 05/26/2017 7:58 AM

## 2017-05-30 LAB — CBC WITH DIFFERENTIAL/PLATELET
BASOS PCT: 0.8 %
Basophils Absolute: 39 cells/uL (ref 0–200)
Eosinophils Absolute: 255 cells/uL (ref 15–500)
Eosinophils Relative: 5.2 %
HCT: 33.9 % — ABNORMAL LOW (ref 35.0–45.0)
Hemoglobin: 11.4 g/dL — ABNORMAL LOW (ref 11.7–15.5)
Lymphs Abs: 1627 cells/uL (ref 850–3900)
MCH: 28.1 pg (ref 27.0–33.0)
MCHC: 33.6 g/dL (ref 32.0–36.0)
MCV: 83.5 fL (ref 80.0–100.0)
MPV: 10.4 fL (ref 7.5–12.5)
Monocytes Relative: 7.2 %
NEUTROS PCT: 53.6 %
Neutro Abs: 2626 cells/uL (ref 1500–7800)
PLATELETS: 167 10*3/uL (ref 140–400)
RBC: 4.06 10*6/uL (ref 3.80–5.10)
RDW: 13.1 % (ref 11.0–15.0)
TOTAL LYMPHOCYTE: 33.2 %
WBC: 4.9 10*3/uL (ref 3.8–10.8)
WBCMIX: 353 {cells}/uL (ref 200–950)

## 2017-05-30 LAB — TSH: TSH: 3.92 m[IU]/L (ref 0.40–4.50)

## 2017-05-30 LAB — BASIC METABOLIC PANEL WITH GFR
BUN / CREAT RATIO: 15 (calc) (ref 6–22)
BUN: 15 mg/dL (ref 7–25)
CO2: 27 mmol/L (ref 20–32)
CREATININE: 0.99 mg/dL — AB (ref 0.60–0.93)
Calcium: 10.2 mg/dL (ref 8.6–10.4)
Chloride: 101 mmol/L (ref 98–110)
GFR, Est African American: 67 mL/min/{1.73_m2} (ref >=60)
GFR, Est Non African American: 58 mL/min/{1.73_m2} — ABNORMAL LOW (ref >=60)
GLUCOSE: 132 mg/dL — AB (ref 65–99)
POTASSIUM: 4.3 mmol/L (ref 3.5–5.3)
SODIUM: 137 mmol/L (ref 135–146)

## 2017-05-30 LAB — TEST AUTHORIZATION

## 2017-05-30 LAB — VITAMIN B12: VITAMIN B 12: 469 pg/mL (ref 200–1100)

## 2017-05-30 LAB — FOLATE: Folate: 12.7 ng/mL

## 2017-05-30 LAB — IRON,TIBC AND FERRITIN PANEL
%SAT: 23 % (ref 11–50)
Ferritin: 34 ng/mL (ref 20–288)
IRON: 80 ug/dL (ref 45–160)
TIBC: 345 ug/dL (ref 250–450)

## 2017-05-30 LAB — HEMOGLOBIN A1C
Hgb A1c MFr Bld: 6.6 % of total Hgb — ABNORMAL HIGH (ref <5.7)
Mean Plasma Glucose: 143 (calc)
eAG (mmol/L): 7.9 (calc)

## 2017-07-06 ENCOUNTER — Other Ambulatory Visit: Payer: Self-pay | Admitting: Physician Assistant

## 2017-07-08 ENCOUNTER — Other Ambulatory Visit: Payer: Self-pay

## 2017-07-08 MED ORDER — SUCRALFATE 1 G PO TABS: 1.0000 g | ORAL_TABLET | Freq: Two times a day (BID) | ORAL | 1 refills | Status: DC

## 2017-07-15 ENCOUNTER — Other Ambulatory Visit: Payer: Self-pay

## 2017-07-15 MED ORDER — SUCRALFATE 1 G PO TABS: 1.0000 g | ORAL_TABLET | Freq: Two times a day (BID) | ORAL | 1 refills | Status: DC

## 2017-07-29 ENCOUNTER — Other Ambulatory Visit: Payer: Self-pay | Admitting: Physician Assistant

## 2017-07-29 DIAGNOSIS — R600 Localized edema: Secondary | ICD-10-CM

## 2017-09-05 ENCOUNTER — Ambulatory Visit (INDEPENDENT_AMBULATORY_CARE_PROVIDER_SITE_OTHER): Payer: Medicare HMO | Admitting: Physician Assistant

## 2017-09-05 ENCOUNTER — Encounter: Payer: Self-pay | Admitting: Physician Assistant

## 2017-09-05 ENCOUNTER — Other Ambulatory Visit: Payer: Self-pay

## 2017-09-05 VITALS — BP 122/70 | HR 75 | Temp 98.3°F | Resp 16 | Ht 66.0 in | Wt 217.6 lb

## 2017-09-05 DIAGNOSIS — K219 Gastro-esophageal reflux disease without esophagitis: Secondary | ICD-10-CM

## 2017-09-05 DIAGNOSIS — D638 Anemia in other chronic diseases classified elsewhere: Secondary | ICD-10-CM | POA: Insufficient documentation

## 2017-09-05 DIAGNOSIS — R600 Localized edema: Secondary | ICD-10-CM | POA: Diagnosis not present

## 2017-09-05 DIAGNOSIS — K2970 Gastritis, unspecified, without bleeding: Secondary | ICD-10-CM | POA: Insufficient documentation

## 2017-09-05 DIAGNOSIS — K635 Polyp of colon: Secondary | ICD-10-CM

## 2017-09-05 DIAGNOSIS — E119 Type 2 diabetes mellitus without complications: Secondary | ICD-10-CM | POA: Diagnosis not present

## 2017-09-05 DIAGNOSIS — I10 Essential (primary) hypertension: Secondary | ICD-10-CM

## 2017-09-05 DIAGNOSIS — K2900 Acute gastritis without bleeding: Secondary | ICD-10-CM | POA: Insufficient documentation

## 2017-09-05 DIAGNOSIS — E785 Hyperlipidemia, unspecified: Secondary | ICD-10-CM

## 2017-09-05 DIAGNOSIS — K2930 Chronic superficial gastritis without bleeding: Secondary | ICD-10-CM

## 2017-09-05 DIAGNOSIS — L989 Disorder of the skin and subcutaneous tissue, unspecified: Secondary | ICD-10-CM

## 2017-09-05 HISTORY — DX: Gastritis, unspecified, without bleeding: K29.70

## 2017-09-05 HISTORY — DX: Polyp of colon: K63.5

## 2017-09-05 NOTE — Progress Notes (Signed)
Patient ID: Tamara [name] MRN: [id number], DOB: 08/15/1945, 72 y.o. Date of Encounter: '@DATE'$ @  Chief Complaint:  No chief complaint on file.   HPI: 72 y.o. year old female     07/14/2016: Presents as a new patient to establish care.  Her husband sees me and I'm his primary care provider. She states that she had been seeing a PCP in [place]. Says that her last routine office visit there was almost 3 months ago says that her 3 month follow-up is due July 9th.  She says that "he will refer me for anything "and "he doesn't believe in colonoscopy ".  Says that her diabetes was diagnosed in 2008.  Is that she does have a blood pressure cuff at home. Says that she hasn't taken the lisinopril for the past 3 days because her blood pressure was reading low and she was feeling weak.  Reviewed that her medication list states Lasix 40 mg as needed. I asked how often she takes this on average and she says that she takes this every 2-3 days.  Says that she did have DEXA scan this year which was normal. Says she does have mammogram every year. Says that she has flu shot and has had pneumonia shot. Says that she doesn't need a shingles vaccine because she never had chickenpox.  Says that she takes Protonix every day and still feels indigestion symptoms. Has to put take Protonix in addition to Alka-Seltzer and Tums to try to get relief. Says that she informed her prior PCP about this multiple times and he kept mentioning doing a referral but never did do this.  Also she is interested in/agreeable to have colonoscopy for colorectal cancer screening.  She knows of no contraindication for her to take aspirin. Has had no diagnosed stomach ulcer or bleeding.  She is not fasting today.  No other specific concerns to address today.    AT THAT OV:  She states that it has not been quite 3 months since her last visit with prior PCP. Also she is not fasting today. I will have her return  for follow-up visit at the end of July and will have her come fasting to that appointment.   Currently her Lasix is 40 mg every 2 days. It would make a lot more sense for her to take 20 mg on a daily basis or even changed to HCTZ. At this time I'll discontinue the Lasix and will change to HCTZ 25 mg daily. She also reports that she stopped taking the lisinopril for the past 3 days because she was feeling weak and lightheaded and her blood pressure was reading low. I wonder if she had taken the Lasix on that day when she was feeling flat weak and lightheaded and her blood pressure was reading low. Think that if we stop this high-dose Lasix intermittent dosing then she can tolerate ACE inhibitor which she needs given her diabetes. Therefore changing the Lasix to HCTZ 25 mg daily and will have her try to resume the lisinopril.  She is on protonix but symptoms are not well controlled with this. Needs follow-up with GI for possible endoscopy. - Ambulatory referral to Gastroenterology  She has had no colonoscopy but is agreeable to have this for colorectal cancer screening. - Ambulatory referral to Gastroenterology    08/23/2016: Last office visit I also recommended her start aspirin. She states that she is not going to start the aspirin until she has her evaluation with GI. Otherwise  she did follow through with everything that I had recommended at last visit. She is fasting today for lab. She is able to leave a urine sample today. She did stop the Lasix. She did start the HCTZ daily and says that the swelling is "fine". She has been able to resume the lisinopril and is taking this daily and is no longer having any lightheadedness or other adverse effects. Says that all that resolved since we changed the Lasix to HCTZ. She states that she did get her appointment with GI is scheduled and its for next Monday one week from today. She reports that she does have diabetic eye exam annually. Says the last  one was around November or December 2017. Says "got new glasses then too ". She reports that her prior PCP had educated her regarding diabetic foot care. She has no other specific concerns to address today.   11/22/2016: Today I reviewed that labs from last visit 08/23/2016 showed A1c 5.4 and I recommended for her to stop the glimepiride/Amaryl.   Today I verified with her that she did stop this medication as directed. She reports that she frequently has problems with ingrown toenails on both of her first toes.  Says that she digs them out herself but is concerned about these, given her diabetes.  Also states that those toes frequently hurt when wearing shoes.  She has no other specific concerns to address today. She is taking blood pressure medications as directed.  No lightheadedness or other adverse effects. She is taking her metformin as directed.  No adverse effects. She is taking her Crestor as directed.  No myalgias or other adverse effects.   02/23/2017: She is taking blood pressure medications as directed.  No lightheadedness or other adverse effects. She is taking her metformin as directed.  No adverse effects. She is taking her Crestor as directed.  No myalgias or other adverse effects. I did review her chart -- she had endoscopy and colonoscopy performed 02/08/17.  However I did not read those reports.   She states that the colonoscopy shows 5 polyps.  However at this point she does not know when she is supposed to do repeat colonoscopy.   She states that they did a biopsy of her stomach and that she has a follow-up office visit 3 weeks and will find out results from that biopsy at that visit.   Says that she is scheduled for ultrasound and another test-- says "he thinks it is my gallbladder" States that she did see podiatry for 2 visits since her last visit with me and they have fixed the areas with her toenails.   At this time does not have any further appointment scheduled at  podiatry.  Will follow-up with them PRN. She has no specific concerns to address today.  Things are stable from her standpoint.   Addendum Added on 03/31/2017: Received note from [facility] with surgical pathology report. Colon polyp showed tubular adenoma.  Repeat colonoscopy 5 years. Report also showed specimens from stomach/from EGD procedure.  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  Findings consistent with reflux gastroesophagitis.  One polyps were negative for dysplasia and malignancy.   Overall---- Repeat Colonoscopy 5 years   05/26/2017: She states that I had decreased the metformin from 1000 twice a day to 500 mg twice a day.  States that when she did that-- her blood sugar went up.  Therefore-- on her own--- she has increased back to taking the 1000  mg twice a day.  States that she took the lower dose 500 twice a day for only about 1 month and has been taking the 1000 twice daily for about 2 months now.    She is having no GI side effects.  No other adverse effects. She is taking the Crestor daily.  She said this is causing no myalgias or other adverse effects. She is taking the lisinopril daily.  She is having no lightheadedness or other adverse effects. She is taking HCTZ daily.  This is controlling her lower extremity edema.  Is causing no lightheadedness or other adverse effects. She also brings in one additional medicine bottle and states that that is what GI added.  We have added this to her medicine list today.  They added sucralfate. She states that "I stay tired all the time.  Have no energy to do anything.  Of got a lot to do, but do not have the energy to do it.  "I asked if she feels depressed.  She says no I am not depressed.  Says that she has all these things that she wants to do but just does not have the energy. No other specific complaints/concerns today.    09/05/2017: She reports that she has a skin lesion on her left lower  leg on the lateral aspect of her calf that she has recently noticed and she is concerned could be skin cancer.  Is requesting referral to dermatology. Reports that she has no other specific concerns to address.  Also has no new medical updates since last visit. She continues to take the metformin as directed with no adverse effects. She is taking the Crestor daily.  She said this is causing no myalgias or other adverse effects. She is taking the lisinopril daily.  She is having no lightheadedness or other adverse effects. She is taking HCTZ daily.  This is controlling her lower extremity edema.  Is causing no lightheadedness or other adverse effects. Reviewed that at last visit she was complaining of fatigue and lack of energy.  All of those labs came back normal.  CBC showed some mild anemia of chronic disease.  Iron studies folate B12 are all normal.  TSH was normal.    Past Medical History:  Diagnosis Date  . Diabetes mellitus without complication ([place])   . GERD (gastroesophageal reflux disease)   . Hypertension   . RUQ pain 03/09/2017   Per patient, she has had RUQ pain 4-5 years that has grown worse in the last few months.     Home Meds: Outpatient Medications Prior to Visit  Medication Sig Dispense Refill  . Alcohol Swabs (B-D SINGLE USE SWABS REGULAR) PADS Use to check blood sugar twice daily. 200 each 5  . blood glucose meter kit and supplies KIT Dispense based on patient and insurance preference. Use up to four times daily as directed. (FOR ICD-10 E11.9). 1 each 0  . glucose blood test strip Use as instructed to check blood sugar twice daily 100 each 12  . hydrochlorothiazide (HYDRODIURIL) 25 MG tablet TAKE 1 TABLET (25 MG TOTAL) BY MOUTH DAILY. 90 tablet 0  . KLOR-CON M20 20 MEQ tablet TAKE 1 TABLET EVERY DAY 90 tablet 0  . lisinopril (PRINIVIL,ZESTRIL) 10 MG tablet TAKE 1 TABLET EVERY DAY 90 tablet 0  . metFORMIN (GLUCOPHAGE) 1000 MG tablet TAKE 1 TABLET TWICE DAILY 180 tablet  0  . pantoprazole (PROTONIX) 40 MG tablet Take 1 tablet (40 mg total) by mouth daily.  90 tablet 3  . ranitidine (ZANTAC) 300 MG tablet Take 1 tablet (300 mg total) by mouth daily as needed. 90 tablet 0  . rosuvastatin (CRESTOR) 10 MG tablet Take 1 tablet (10 mg total) by mouth daily. 90 tablet 3  . aspirin 81 MG tablet Take 1 tablet (81 mg total) by mouth daily. 90 tablet 11  .    0   No facility-administered medications prior to visit.     Allergies:  Allergies  Allergen Reactions  . Penicillins Hives    Social History   Socioeconomic History  . Marital status: Married    Spouse name: Not on file  . Number of children: Not on file  . Years of education: Not on file  . Highest education level: Not on file  Occupational History  . Not on file  Social Needs  . Financial resource strain: Not on file  . Food insecurity:    Worry: Not on file    Inability: Not on file  . Transportation needs:    Medical: Not on file    Non-medical: Not on file  Tobacco Use  . Smoking status: Former Research scientist (life sciences)  . Smokeless tobacco: Never Used  Substance and Sexual Activity  . Alcohol use: No  . Drug use: No  . Sexual activity: Not on file  Lifestyle  . Physical activity:    Days per week: Not on file    Minutes per session: Not on file  . Stress: Not on file  Relationships  . Social connections:    Talks on phone: Not on file    Gets together: Not on file    Attends religious service: Not on file    Active member of club or organization: Not on file    Attends meetings of clubs or organizations: Not on file    Relationship status: Not on file  . Intimate partner violence:    Fear of current or ex partner: Not on file    Emotionally abused: Not on file    Physically abused: Not on file    Forced sexual activity: Not on file  Other Topics Concern  . Not on file  Social History Narrative  . Not on file    Family History  Problem Relation Age of Onset  . Breast cancer Neg Hx        Review of Systems:  See HPI for pertinent ROS. All other ROS negative.     Physical Exam: Blood pressure 122/70, pulse 75, temperature 98.3 F (36.8 C), temperature source Oral, resp. rate 16, height '5\' 6"'$  (1.676 m), weight 98.7 kg, SpO2 95 %., Body mass index is 35.12 kg/m. General:  Obese WF. Appears in no acute distress. Neck: Supple. No thyromegaly. No lymphadenopathy. No carotid bruits. Lungs: Clear bilaterally to auscultation without wheezes, rales, or rhonchi. Breathing is unlabored. Heart: RRR with S1 S2. No murmurs, rubs, or gallops. Abdomen: Soft, non-tender, non-distended with normoactive bowel sounds. No hepatomegaly. No rebound/guarding. No obvious abdominal masses. Musculoskeletal:  Strength and tone normal for age. Extremities/Skin: Warm and dry. No LE edema.  Lateral aspect of left calf : ~ 0.5 cm diameter papular lesion. Raised. Pink coloration. Scaly surface.  Neuro: Alert and oriented X 3. Moves all extremities spontaneously. Gait is normal. CNII-XII grossly in tact. Psych:  Responds to questions appropriately with a normal affect.    Diabetic Foot Exam: Inspection is normal.  Sensaation is intact.  No palpable DP or PT pulses bilaterally.   ASSESSMENT AND  PLAN:  72 y.o. year old female with    Anemia of chronic disease 09/05/2017: Labs 05/26/2017 showed mild anemia.  Normochromic,  normocytic.  Iron studies folate and B12 were normal to confirm anemia of chronic disease.  Skin lesion of left leg 09/05/2017: Refer to Dermatology for evaluation and for complete skin cancer screen - Ambulatory referral to Dermatology   Type 2 diabetes mellitus without complication, without long-term current use of insulin ([place])  She is on metformin.  She is on ACE inhibitor. She is on statin. We'll have her add baby aspirin daily.----08/23/2016-----She will wait to start after she has eval at GI - aspirin 81 MG tablet; Take 1 tablet (81 mg total) by mouth daily.  Dispense: 30  tablet; Refill: 11  She does have annual eye exam. I told her that when she goes for her next eye exam to have them list as is her PCP and send copy of note. She has been educated regarding diabetic foot care.  08/23/2016 A1C, MicroAlbumin, CMET 11/22/2016: Check A1C ------------referral ordered for Podiatry  02/23/2017: Check A1C 02/23/2017: Wait to decide about adding aspirin after GI evaluation complete  05/26/2017: She reports that when she decreased metformin from 1000 twice daily to 500 twice daily this caused her sugars to go up since she has gone back to taking 1000 twice daily for the past 2 months.  Will recheck A1c today to monitor.  09/05/2017: Check A1C and MicroAlbumin to monitor    Hyperlipidemia, unspecified hyperlipidemia type She is on Crestor.  08/23/2016: She is fasting. Check FLP LFT now. 11/22/2016: Lipid panel was checked 08/23/16.  LDL was 95.  Continue current dose Crestor. 02/23/2017: Check FLP/LFT 05/26/2017: Had follow-up FLP/LFT 04/08/2017.  LDL was at 79.  She was to continue current dose Crestor 20 mg.  Will wait to recheck these labs. 09/05/2017: Continue Crestor.  Essential hypertension 09/05/2017:  Blood Pressure is at goal. She is on ACE inhibitor. Check lab to monitor every 6 months.    Bilateral lower extremity edema 08/23/16: At last visit changed her Lasix intermittent dosing to HCTZ daily. She states that this is working well on her edema is controlled. Check lab to monitor. 09/05/2017:  Lower Extremity edema is controlled.  Continue HCTZ 25 mg daily.  Gastroesophageal reflux disease, esophagitis presence not specified 08/23/16: at last visit I put in referral to GI. She states that her appointment as scheduled 1 week from today. 11/22/2016: She states that her appointment with GI is not until January. 5/92019: She had endoscopy and colonoscopy on 02/08/2017.  See details in HPI.  She has follow-up scheduled with GI in several weeks.  Gastroesophagitis: 09/05/2017:   Endoscopy showed gastroesophagitis.  At visit 05/26/2017 she reports that GI added sucralfate.  She is taking this as directed.  Screening for colorectal cancer 08/23/16: at last visit I put in referral to GI. She states that her appointment as scheduled 1 week from today. 11/22/2016: She states that her appointment with GI is not until January. 02/23/2017: She had endoscopy and colonoscopy on 02/08/2017.See details in HPI.  She has follow-up scheduled with GI in several weeks. Addendum Added on 03/31/2017: Received note from [facility] with surgical pathology report. Colon polyp showed tubular adenoma.  Repeat colonoscopy 5 years. Report also showed specimens from stomach/from EGD procedure.  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  Findings consistent with reflux gastroesophagitis.  One polyps were negative for dysplasia and malignancy.   Overall---- Repeat Colonoscopy 5 years  She states that she has mammogram every year.  She states that she had DEXA scan this past year and reports that this was normal.  She reports that she has flu shots and has had a pneumonia shot. Says that she never had chickenpox so does not need shingles vaccine. Will further discuss preventative care at follow-up visit.   Plan for routine follow-up visit in 3 months. Sooner if needed.   Signed, [id number] [place], [place], B[facility] 09/05/2017 8:00 AM

## 2017-09-06 LAB — HEMOGLOBIN A1C
Hgb A1c MFr Bld: 6.9 % of total Hgb — ABNORMAL HIGH (ref <5.7)
Mean Plasma Glucose: 151 (calc)
eAG (mmol/L): 8.4 (calc)

## 2017-09-06 LAB — MICROALBUMIN, URINE: Microalb, Ur: 0.5 mg/dL

## 2017-09-16 ENCOUNTER — Other Ambulatory Visit: Payer: Self-pay | Admitting: Physician Assistant

## 2017-09-20 DIAGNOSIS — L708 Other acne: Secondary | ICD-10-CM | POA: Diagnosis not present

## 2017-09-20 DIAGNOSIS — B078 Other viral warts: Secondary | ICD-10-CM | POA: Diagnosis not present

## 2017-09-20 DIAGNOSIS — L820 Inflamed seborrheic keratosis: Secondary | ICD-10-CM | POA: Diagnosis not present

## 2017-09-29 ENCOUNTER — Ambulatory Visit (INDEPENDENT_AMBULATORY_CARE_PROVIDER_SITE_OTHER): Payer: Medicare HMO | Admitting: Family Medicine

## 2017-09-29 DIAGNOSIS — Z23 Encounter for immunization: Secondary | ICD-10-CM | POA: Diagnosis not present

## 2017-09-30 ENCOUNTER — Other Ambulatory Visit: Payer: Self-pay | Admitting: Physician Assistant

## 2017-09-30 DIAGNOSIS — R600 Localized edema: Secondary | ICD-10-CM

## 2017-10-13 ENCOUNTER — Other Ambulatory Visit: Payer: Self-pay | Admitting: Physician Assistant

## 2017-10-18 ENCOUNTER — Other Ambulatory Visit: Payer: Self-pay | Admitting: Physician Assistant

## 2017-11-22 ENCOUNTER — Other Ambulatory Visit: Payer: Self-pay | Admitting: Physician Assistant

## 2017-11-25 ENCOUNTER — Other Ambulatory Visit: Payer: Self-pay | Admitting: Physician Assistant

## 2017-12-05 ENCOUNTER — Other Ambulatory Visit: Payer: Self-pay | Admitting: Physician Assistant

## 2017-12-05 DIAGNOSIS — R600 Localized edema: Secondary | ICD-10-CM

## 2017-12-07 ENCOUNTER — Ambulatory Visit: Payer: Medicare HMO | Admitting: Physician Assistant

## 2017-12-07 ENCOUNTER — Encounter: Payer: Self-pay | Admitting: Family Medicine

## 2017-12-07 ENCOUNTER — Ambulatory Visit (INDEPENDENT_AMBULATORY_CARE_PROVIDER_SITE_OTHER): Payer: Medicare HMO | Admitting: Family Medicine

## 2017-12-07 VITALS — BP 122/70 | HR 79 | Temp 98.1°F | Resp 16 | Ht 66.0 in | Wt 214.1 lb

## 2017-12-07 DIAGNOSIS — E785 Hyperlipidemia, unspecified: Secondary | ICD-10-CM

## 2017-12-07 DIAGNOSIS — Z13820 Encounter for screening for osteoporosis: Secondary | ICD-10-CM

## 2017-12-07 DIAGNOSIS — Z780 Asymptomatic menopausal state: Secondary | ICD-10-CM | POA: Diagnosis not present

## 2017-12-07 DIAGNOSIS — Z6834 Body mass index (BMI) 34.0-34.9, adult: Secondary | ICD-10-CM

## 2017-12-07 DIAGNOSIS — D638 Anemia in other chronic diseases classified elsewhere: Secondary | ICD-10-CM | POA: Diagnosis not present

## 2017-12-07 DIAGNOSIS — I10 Essential (primary) hypertension: Secondary | ICD-10-CM

## 2017-12-07 DIAGNOSIS — Z1239 Encounter for other screening for malignant neoplasm of breast: Secondary | ICD-10-CM

## 2017-12-07 DIAGNOSIS — E669 Obesity, unspecified: Secondary | ICD-10-CM

## 2017-12-07 DIAGNOSIS — K219 Gastro-esophageal reflux disease without esophagitis: Secondary | ICD-10-CM | POA: Diagnosis not present

## 2017-12-07 DIAGNOSIS — Z6836 Body mass index (BMI) 36.0-36.9, adult: Secondary | ICD-10-CM

## 2017-12-07 DIAGNOSIS — Z6837 Body mass index (BMI) 37.0-37.9, adult: Secondary | ICD-10-CM | POA: Insufficient documentation

## 2017-12-07 DIAGNOSIS — R600 Localized edema: Secondary | ICD-10-CM

## 2017-12-07 DIAGNOSIS — E119 Type 2 diabetes mellitus without complications: Secondary | ICD-10-CM

## 2017-12-07 DIAGNOSIS — E661 Drug-induced obesity: Secondary | ICD-10-CM | POA: Insufficient documentation

## 2017-12-07 NOTE — Progress Notes (Signed)
Patient ID: Tamara [name], female    DOB: 1945/09/28, 72 y.o.   MRN: [id number]  PCP: [name], PA-C  Chief Complaint  Patient presents with  . Hypertension    Patient is fasting.    Subjective:   Tamara [name] is a 72 y.o. female, presents to clinic with CC of follow up on HTN, HLD, GERD, mild anemia and b/l LE edema  HTN - Well controlled, at goal - today 122/70, pt does not monitor, usually just at dr visit - no concerning readingsf No SE with lisinopril and HCTZ, she reports excellent compliance No LE edema, CP, SOB, near syncope  HLD -patient reports excellent compliance with medication, she does take in the mornings, she has not had any associated symptoms or side effects, she denies myalgias.  She had previously discussed fatigue with her PCP is unfortunately not at this clinic anymore, she today states that her fatigue has never been related to when she took a statin, it just comes and goes and she feels its likely related to just normal aging.  LE edema -she states that she has no lower extremity edema right now, she takes HCTZ without any concerns or side effects, hasn't needed any compression stocking, no new skin changes, no exertional shortness of breath, orthopnea, PND or palpitations.  Diabetes, she states that her diabetes is "not really well controlled" and her readings are usually in the 120-130's.  She reports excellent compliance with metformin 1000  mg twice a day.  No SE.  No high or low blood sugar readings, and no symptoms of highs or lows.  She has not been to the eye doctor, although it has been ordered for her earlier this year. Is due for a foot exam She has not worked on any particular diet or exercise and she has gradually gained 10 pounds which she would like to work on.  Her last A1c was roughly 3 months ago was 6.9%. Diabetic foot exam will be done today, she is due, she did go to podiatry for ingrown toenails, her right great toe and toenail  sometimes still are little tender and bothersome but she denies any thickened nails, calluses or numbness to her feet.  Fatigue: Mentioned on last routine f/up OV -her thyroid tests were normal when last evaluated, patient does not have any fatigue complaints, denies any exertional symptoms, denies any difficulty with sleeping.  Sometimes she is bored, sometimes she doesn't feel like doing much, she has become more sedentary and has less going on altogether - again states she believes it just part of life.  No change to bowels, hair, skin or mood  GERD/Gastritis: She continues to take Protonix for GERD, sx much better and more controlled and she has been to see a specialist for colonoscopy and upper endoscopy was diagnosed with gastritis, she does take Carafate intermittently as needed for when she has slightly worsening abdominal symptoms  She is not sure what a Medicare well visit is and does not know when she has done one in the past.  She is due for a bone scan and for mammogram Flu was done in september   Patient Active Problem List   Diagnosis Date Noted  . Anemia of chronic disease 09/05/2017  . Gastritis 09/05/2017  . Colon polyps 09/05/2017  . Diabetes mellitus type 2, uncomplicated ([place]) 50/38/8828  . Hypertension 07/14/2016  . Hyperlipemia 07/14/2016  . Bilateral lower extremity edema 07/14/2016  . GERD (gastroesophageal reflux disease) 07/14/2016  .  Screening for colorectal cancer 07/14/2016    Current Meds  Medication Sig  . ACCU-CHEK SOFTCLIX LANCETS lancets TEST UP TO FOUR TIMES DAILY AS DIRECTED. FOR TYPE 2 DIABETES MELLITUS.   Marland Kitchen Alcohol Swabs (B-D SINGLE USE SWABS REGULAR) PADS Use to check blood sugar twice daily.  . blood glucose meter kit and supplies KIT Dispense based on patient and insurance preference. Use up to four times daily as directed. (FOR ICD-10 E11.9).  Marland Kitchen glucose blood test strip Use as instructed to check blood sugar twice daily  . hydrochlorothiazide  (HYDRODIURIL) 25 MG tablet TAKE 1 TABLET EVERY DAY  . lisinopril (PRINIVIL,ZESTRIL) 10 MG tablet TAKE 1 TABLET EVERY DAY  . metFORMIN (GLUCOPHAGE) 1000 MG tablet Take 1 tablet (1,000 mg total) by mouth 2 (two) times daily with a meal.  . pantoprazole (PROTONIX) 40 MG tablet TAKE 1 TABLET EVERY DAY  . potassium chloride SA (K-DUR,KLOR-CON) 20 MEQ tablet TAKE 1 TABLET EVERY DAY  . ranitidine (ZANTAC) 300 MG tablet Take 1 tablet (300 mg total) by mouth daily as needed.  . rosuvastatin (CRESTOR) 20 MG tablet TAKE 1 TABLET EVERY DAY  . sucralfate (CARAFATE) 1 g tablet Take 1 tablet (1 g total) by mouth 2 (two) times daily before a meal.     Review of Systems  Constitutional: Negative.   HENT: Negative.   Eyes: Negative.   Respiratory: Negative.   Cardiovascular: Negative.   Gastrointestinal: Negative.   Endocrine: Negative.   Genitourinary: Negative.   Musculoskeletal: Negative.   Skin: Negative.   Allergic/Immunologic: Negative.   Neurological: Negative.   Hematological: Negative.   Psychiatric/Behavioral: Negative.   All other systems reviewed and are negative.      Objective:    Vitals:   12/07/17 0759  BP: 122/70  Pulse: 79  Resp: 16  Temp: 98.1 F (36.7 C)  TempSrc: Oral  SpO2: 97%  Weight: 214 lb 2 oz (97.1 kg)  Height: '5\' 6"'$  (1.676 m)      Physical Exam  Constitutional: She is oriented to person, place, and time. She appears well-developed and well-nourished.  Non-toxic appearance. No distress.  Well-appearing elderly female, appears stated age  HENT:  Head: Normocephalic and atraumatic.  Right Ear: External ear normal.  Left Ear: External ear normal.  Nose: Nose normal.  Mouth/Throat: Uvula is midline, oropharynx is clear and moist and mucous membranes are normal. No oropharyngeal exudate.  Eyes: Pupils are equal, round, and reactive to light. Conjunctivae, EOM and lids are normal. No scleral icterus.  Neck: Normal range of motion and phonation normal. Neck  supple. No tracheal deviation present. No thyromegaly present.  Cardiovascular: Normal rate, regular rhythm, normal heart sounds, intact distal pulses and normal pulses. Exam reveals no gallop and no friction rub.  No murmur heard. Pulses:      Radial pulses are 2+ on the right side, and 2+ on the left side.       Posterior tibial pulses are 2+ on the right side, and 2+ on the left side.  No lower extremity edema bilaterally  Pulmonary/Chest: Effort normal and breath sounds normal. No stridor. No respiratory distress. She has no wheezes. She has no rhonchi. She has no rales. She exhibits no tenderness.  Abdominal: Soft. Normal appearance and bowel sounds are normal. She exhibits no distension. There is no tenderness. There is no rebound and no guarding.  Musculoskeletal: Normal range of motion. She exhibits no edema or deformity.  Lymphadenopathy:    She has no cervical adenopathy.  Neurological: She is alert and oriented to person, place, and time. She exhibits normal muscle tone. Coordination and gait normal.  Skin: Skin is warm, dry and intact. Capillary refill takes less than 2 seconds. No rash noted. She is not diaphoretic. No erythema. No pallor.  Psychiatric: She has a normal mood and affect. Her speech is normal and behavior is normal.  Nursing note and vitals reviewed.   DM foot exam: Diabetic Foot Exam - detailed  Date & Time: 12/07/2017 8:30 AM Diabetic Foot exam was performed with the following findings: Yes  Visual Foot Exam completed.: Yes  Is there a history of foot ulcer?: No  Is there a foot ulcer now?: No  Is there swelling?: No  Is there elevated skin temperature?: No  Is there abnormal foot shape?: No  Is there a claw toe deformity?: No  Are the toenails long?: No  Are the toenails thick?: Yes  Are the toenails ingrown?: No  Is the skin thin, fragile, shiny and hairless?": No  Normal Range of Motion?: Yes  Is there foot or ankle muscle weakness?: No  Do you have  pain in calf while walking?: No  Are the shoes appropriate in style and fit?: No  Can the patient see the bottom of their feet?: No  Pulse Foot Exam completed.: Yes  Right Posterior Tibialis: Present Left posterior Tibialis: Present  Right Dorsalis Pedis: Present Left Dorsalis Pedis: Present     Sensory Foot Exam Completed.: Yes  Semmes-Weinstein Monofilament Test  "+" means "has sensation" and "-" means "no sensation"  R Foot Test Control: Pos L Foot Test Control: Pos  R Site 1-Great Toe: Pos L Site 1-Great Toe: Pos  R Site 4: Pos L Site 4: Pos  R site 5: Pos L Site 5: Pos  R Site 6: Pos L Site 6: Pos     Image components are not supported.  Image components are not supported. Image components are not supported.  [place]  Comments    Depression screen [facility] 2/9 12/07/2017 09/05/2017 05/26/2017 02/23/2017 11/22/2016  Decreased Interest 0 0 0 0 0  Down, Depressed, Hopeless 0 0 0 0 0  PHQ - 2 Score 0 0 0 0 0  Altered sleeping 0 - - - -  Tired, decreased energy 1 - - - -  Change in appetite 0 - - - -  Feeling bad or failure about yourself  0 - - - -  Trouble concentrating 0 - - - -  Moving slowly or fidgety/restless 0 - - - -  Suicidal thoughts 0 - - - -  PHQ-9 Score 1 - - - -  Difficult doing work/chores Not difficult at all - - - -         Assessment & Plan:   Pt is a 72 y/o female here for routine follow up and labs for multiple chronic diseases  Problem List Items Addressed This Visit      Cardiovascular and Mediastinum   Hypertension - Primary    Well controlled, at goal - today 122/70 No SE with lisinopril and HCTZ, excellent compliance No LE edema Check renal function and electrolytes con't meds 4 month f/up      Relevant Orders   Comprehensive metabolic panel     Digestive   GERD (gastroesophageal reflux disease)    Improved GERD and gastritis on Protonix, occasionally uses Carafate Con't        Endocrine   Diabetes mellitus type 2, uncomplicated  ([place])  A1C mildly increasing over the past year 6.4 to now 6.9 She is compliant with medication, no side effects Monitoring blood sugar at home 120-130's, no highs or lows Recheck A1C and CMP Some weight gain recently from sedentary lifestyle Advised healthier diet and increasing exercise      Relevant Orders   Comprehensive metabolic panel   Lipid panel   Hemoglobin A1C   Ambulatory referral to Ophthalmology     Other   Hyperlipemia    Low HDL, mildly elevated triglycerides, likely secondary to DM/hyperglycemia LDL - 95 Excellent compliance with statin, no SE, no myalgias Check CMP and fasting lipids Encouraged healthy diet and increase exercise as tolerated      Relevant Orders   Lipid panel   Bilateral lower extremity edema    Controlled, no LE edema Continue HCTZ CMP recheck      Anemia of chronic disease    Recheck CBC No worsening fatigue, no sx or exam findings concerning for worsening anemia If unchanged can check annually      Relevant Orders   CBC with Differential/Platelet   Class 1 obesity with body mass index (BMI) of 34.0 to 34.9 in adult    Increase activity/exercise, decrease calories by avoiding simple sugars and high fat/fried foods       Other Visit Diagnoses    Postmenopausal estrogen deficiency       Relevant Orders   DG Bone Density   Screening for osteoporosis       Relevant Orders   DG Bone Density   Screening for malignant neoplasm of breast       Relevant Orders   MM Digital Screening      Needs [facility] follow up appointment to address health maintenance and screening    [name], PA-C 12/07/17 8:24 AM

## 2017-12-07 NOTE — Assessment & Plan Note (Signed)
Well controlled, at goal - today 122/70 No SE with lisinopril and HCTZ, excellent compliance No LE edema Check renal function and electrolytes con't meds 4 month f/up

## 2017-12-07 NOTE — Assessment & Plan Note (Addendum)
Low HDL, mildly elevated triglycerides, likely secondary to DM/hyperglycemia LDL - 95 Excellent compliance with statin, no SE, no myalgias Check CMP and fasting lipids Encouraged healthy diet and increase exercise as tolerated

## 2017-12-07 NOTE — Assessment & Plan Note (Signed)
Improved GERD and gastritis on Protonix, occasionally uses Carafate Con't

## 2017-12-07 NOTE — Assessment & Plan Note (Signed)
Increase activity/exercise, decrease calories by avoiding simple sugars and high fat/fried foods

## 2017-12-07 NOTE — Assessment & Plan Note (Addendum)
A1C mildly increasing over the past year 6.4 to now 6.9 She is compliant with medication, no side effects Monitoring blood sugar at home 120-130's, no highs or lows Recheck A1C and CMP Some weight gain recently from sedentary lifestyle Advised healthier diet and increasing exercise

## 2017-12-07 NOTE — Assessment & Plan Note (Signed)
Recheck CBC No worsening fatigue, no sx or exam findings concerning for worsening anemia If unchanged can check annually

## 2017-12-07 NOTE — Assessment & Plan Note (Addendum)
Controlled, no LE edema Continue HCTZ CMP recheck

## 2017-12-08 LAB — CBC WITH DIFFERENTIAL/PLATELET
Basophils Absolute: 38 cells/uL (ref 0–200)
Basophils Relative: 0.6 %
EOS ABS: 321 {cells}/uL (ref 15–500)
Eosinophils Relative: 5.1 %
HCT: 34.9 % — ABNORMAL LOW (ref 35.0–45.0)
Hemoglobin: 11.5 g/dL — ABNORMAL LOW (ref 11.7–15.5)
Lymphs Abs: 1814 cells/uL (ref 850–3900)
MCH: 27.8 pg (ref 27.0–33.0)
MCHC: 33 g/dL (ref 32.0–36.0)
MCV: 84.5 fL (ref 80.0–100.0)
MONOS PCT: 6.5 %
MPV: 10.6 fL (ref 7.5–12.5)
NEUTROS PCT: 59 %
Neutro Abs: 3717 cells/uL (ref 1500–7800)
PLATELETS: 168 10*3/uL (ref 140–400)
RBC: 4.13 10*6/uL (ref 3.80–5.10)
RDW: 13.3 % (ref 11.0–15.0)
TOTAL LYMPHOCYTE: 28.8 %
WBC mixed population: 410 cells/uL (ref 200–950)
WBC: 6.3 10*3/uL (ref 3.8–10.8)

## 2017-12-08 LAB — HEMOGLOBIN A1C
EAG (MMOL/L): 7.9 (calc)
Hgb A1c MFr Bld: 6.6 % of total Hgb — ABNORMAL HIGH (ref <5.7)
MEAN PLASMA GLUCOSE: 143 (calc)

## 2017-12-08 LAB — LIPID PANEL
CHOL/HDL RATIO: 3.5 (calc) (ref <5.0)
Cholesterol: 166 mg/dL (ref <200)
HDL: 48 mg/dL — ABNORMAL LOW (ref >50)
LDL CHOLESTEROL (CALC): 91 mg/dL
Non-HDL Cholesterol (Calc): 118 mg/dL (calc) (ref <130)
TRIGLYCERIDES: 168 mg/dL — AB (ref <150)

## 2017-12-08 LAB — COMPREHENSIVE METABOLIC PANEL
AG RATIO: 1.8 (calc) (ref 1.0–2.5)
ALT: 14 U/L (ref 6–29)
AST: 18 U/L (ref 10–35)
Albumin: 4.3 g/dL (ref 3.6–5.1)
Alkaline phosphatase (APISO): 46 U/L (ref 33–130)
BUN/Creatinine Ratio: 16 (calc) (ref 6–22)
BUN: 16 mg/dL (ref 7–25)
CO2: 26 mmol/L (ref 20–32)
CREATININE: 1 mg/dL — AB (ref 0.60–0.93)
Calcium: 10.2 mg/dL (ref 8.6–10.4)
Chloride: 103 mmol/L (ref 98–110)
GLOBULIN: 2.4 g/dL (ref 1.9–3.7)
GLUCOSE: 130 mg/dL — AB (ref 65–99)
POTASSIUM: 4.5 mmol/L (ref 3.5–5.3)
SODIUM: 138 mmol/L (ref 135–146)
TOTAL PROTEIN: 6.7 g/dL (ref 6.1–8.1)
Total Bilirubin: 0.3 mg/dL (ref 0.2–1.2)

## 2017-12-29 DIAGNOSIS — H524 Presbyopia: Secondary | ICD-10-CM | POA: Diagnosis not present

## 2017-12-29 LAB — HM DIABETES EYE EXAM

## 2018-01-24 ENCOUNTER — Encounter: Payer: Self-pay | Admitting: Family Medicine

## 2018-01-24 ENCOUNTER — Ambulatory Visit (INDEPENDENT_AMBULATORY_CARE_PROVIDER_SITE_OTHER): Payer: Medicare HMO | Admitting: Family Medicine

## 2018-01-24 VITALS — BP 124/68 | HR 65 | Temp 98.5°F | Resp 16 | Ht 66.0 in | Wt 213.4 lb

## 2018-01-24 DIAGNOSIS — J069 Acute upper respiratory infection, unspecified: Secondary | ICD-10-CM

## 2018-01-24 DIAGNOSIS — R509 Fever, unspecified: Secondary | ICD-10-CM | POA: Diagnosis not present

## 2018-01-24 LAB — INFLUENZA A AND B AG, IMMUNOASSAY
INFLUENZA A ANTIGEN: NOT DETECTED
INFLUENZA B ANTIGEN: NOT DETECTED

## 2018-01-24 MED ORDER — BENZONATATE 100 MG PO CAPS: 100.0000 mg | ORAL_CAPSULE | Freq: Three times a day (TID) | ORAL | 0 refills | Status: DC | PRN

## 2018-01-24 MED ORDER — HYDROCODONE-HOMATROPINE 5-1.5 MG/5ML PO SYRP: 5.0000 mL | ORAL_SOLUTION | Freq: Three times a day (TID) | ORAL | 0 refills | Status: DC | PRN

## 2018-01-24 MED ORDER — MOMETASONE FUROATE 50 MCG/ACT NA SUSP: 2.0000 | Freq: Every day | NASAL | 1 refills | Status: DC

## 2018-01-24 MED ORDER — PREDNISONE 20 MG PO TABS: ORAL_TABLET | ORAL | 0 refills | Status: DC

## 2018-01-24 MED ORDER — CETIRIZINE HCL 10 MG PO TBDP: 10.0000 mg | ORAL_TABLET | Freq: Every day | ORAL | 0 refills | Status: DC

## 2018-01-24 NOTE — Progress Notes (Signed)
Patient ID: Tamara [name], female    DOB: Jul 08, 1945, 73 y.o.   MRN: [id number]  PCP: [name], PA-C  Chief Complaint  Patient presents with  . Fever    Patient in today with c/o fever, cough, nasal congestion, chest congestion and leg congestion. Onset of symptoms last thursday.    Subjective:   Tamara [name] is a 73 y.o. female, presents to clinic with CC of URI symptoms with subjective fever and chills, nonproductive cough, nasal and chest congestion with some mild swelling of her legs despite using diuretics.  Symptoms began 4 to 5 days ago, her husband is sick with the same symptoms.  She feels generally ill and fatigued.  She does not have any chest pain, wheeze or shortness of breath, nausea vomiting, headaches, near syncope, palpitations, orthopnea.  She does have some raspy voice, postnasal drip.   Patient Active Problem List   Diagnosis Date Noted  . Class 1 obesity with body mass index (BMI) of 34.0 to 34.9 in adult 12/07/2017  . Anemia of chronic disease 09/05/2017  . Gastritis 09/05/2017  . Colon polyps 09/05/2017  . Diabetes mellitus type 2, uncomplicated ([place]) 18/56/3149  . Hypertension 07/14/2016  . Hyperlipemia 07/14/2016  . Bilateral lower extremity edema 07/14/2016  . GERD (gastroesophageal reflux disease) 07/14/2016  . Screening for colorectal cancer 07/14/2016     Prior to Admission medications   Medication Sig Start Date End Date Taking? Authorizing Provider  ACCU-CHEK SOFTCLIX LANCETS lancets TEST UP TO FOUR TIMES DAILY AS DIRECTED. FOR TYPE 2 DIABETES MELLITUS.  11/25/17  Yes [place], Modena Nunnery, MD  Alcohol Swabs (B-D SINGLE USE SWABS REGULAR) PADS Use to check blood sugar twice daily. 04/12/17  Yes Dena Billet B, PA-C  blood glucose meter kit and supplies KIT Dispense based on patient and insurance preference. Use up to four times daily as directed. (FOR ICD-10 E11.9). 09/07/16  Yes Dena Billet B, PA-C  glucose blood test strip Use as  instructed to check blood sugar twice daily 04/12/17  Yes Dena Billet B, PA-C  hydrochlorothiazide (HYDRODIURIL) 25 MG tablet TAKE 1 TABLET EVERY DAY 12/05/17  Yes [name], PA-C  lisinopril (PRINIVIL,ZESTRIL) 10 MG tablet TAKE 1 TABLET EVERY DAY 11/22/17  Yes [name], PA-C  metFORMIN (GLUCOPHAGE) 1000 MG tablet Take 1 tablet (1,000 mg total) by mouth 2 (two) times daily with a meal. 05/26/17 05/26/18 Yes [place], Stanton Kidney B, PA-C  pantoprazole (PROTONIX) 40 MG tablet TAKE 1 TABLET EVERY DAY 10/13/17  Yes Dena Billet B, PA-C  potassium chloride SA (K-DUR,KLOR-CON) 20 MEQ tablet TAKE 1 TABLET EVERY DAY 09/30/17  Yes Dena Billet B, PA-C  ranitidine (ZANTAC) 300 MG tablet Take 1 tablet (300 mg total) by mouth daily as needed. 04/12/17  Yes Dena Billet B, PA-C  rosuvastatin (CRESTOR) 20 MG tablet TAKE 1 TABLET EVERY DAY 10/19/17  Yes Dena Billet B, PA-C  sucralfate (CARAFATE) 1 g tablet Take 1 tablet (1 g total) by mouth 2 (two) times daily before a meal. 07/15/17  Yes Orlena Sheldon, PA-C     Allergies  Allergen Reactions  . Penicillins Hives     Family History  Problem Relation Age of Onset  . Breast cancer Neg Hx      Social History   Socioeconomic History  . Marital status: Married    Spouse name: Not on file  . Number of children: Not on file  . Years of education: Not on file  . Highest education  level: Not on file  Occupational History  . Not on file  Social Needs  . Financial resource strain: Not on file  . Food insecurity:    Worry: Not on file    Inability: Not on file  . Transportation needs:    Medical: Not on file    Non-medical: Not on file  Tobacco Use  . Smoking status: Former Research scientist (life sciences)  . Smokeless tobacco: Never Used  Substance and Sexual Activity  . Alcohol use: No  . Drug use: No  . Sexual activity: Not on file  Lifestyle  . Physical activity:    Days per week: Not on file    Minutes per session: Not on file  . Stress: Not on file  Relationships  . Social  connections:    Talks on phone: Not on file    Gets together: Not on file    Attends religious service: Not on file    Active member of club or organization: Not on file    Attends meetings of clubs or organizations: Not on file    Relationship status: Not on file  . Intimate partner violence:    Fear of current or ex partner: Not on file    Emotionally abused: Not on file    Physically abused: Not on file    Forced sexual activity: Not on file  Other Topics Concern  . Not on file  Social History Narrative  . Not on file     Review of Systems  Constitutional: Negative.   HENT: Negative.   Eyes: Negative.   Respiratory: Negative.   Cardiovascular: Negative.   Gastrointestinal: Negative.   Endocrine: Negative.   Genitourinary: Negative.   Musculoskeletal: Negative.   Skin: Negative.   Allergic/Immunologic: Negative.   Neurological: Negative.   Hematological: Negative.   Psychiatric/Behavioral: Negative.   All other systems reviewed and are negative.      Objective:    Vitals:   01/24/18 1056  BP: 124/68  Pulse: 65  Resp: 16  Temp: 98.5 F (36.9 C)  TempSrc: Oral  SpO2: 98%  Weight: 213 lb 6 oz (96.8 kg)  Height: 5' 6" (1.676 m)      Physical Exam Vitals signs and nursing note reviewed.  Constitutional:      General: She is not in acute distress.    Appearance: She is well-developed. She is obese. She is not ill-appearing, toxic-appearing or diaphoretic.  HENT:     Head: Normocephalic and atraumatic.     Comments: Nasal mucosa very edematous erythematous Throat injected No lymphadenopathy     Right Ear: Hearing, tympanic membrane, ear canal and external ear normal.     Left Ear: Hearing, tympanic membrane, ear canal and external ear normal.     Nose: Mucosal edema, congestion and rhinorrhea present.     Right Sinus: No maxillary sinus tenderness or frontal sinus tenderness.     Left Sinus: No maxillary sinus tenderness or frontal sinus tenderness.      Mouth/Throat:     Mouth: Mucous membranes are moist. Mucous membranes are not pale.     Pharynx: Uvula midline. Posterior oropharyngeal erythema present. No oropharyngeal exudate or uvula swelling.     Tonsils: No tonsillar abscesses.  Eyes:     General: No scleral icterus.       Right eye: No discharge.        Left eye: No discharge.     Conjunctiva/sclera: Conjunctivae normal.     Pupils: Pupils are equal, round,  and reactive to light.  Neck:     Musculoskeletal: Normal range of motion and neck supple.     Trachea: No tracheal deviation.  Cardiovascular:     Rate and Rhythm: Normal rate and regular rhythm.     Pulses: Normal pulses.     Heart sounds: Normal heart sounds. No murmur. No friction rub. No gallop.   Pulmonary:     Effort: Pulmonary effort is normal. No respiratory distress.     Breath sounds: Normal breath sounds. No stridor. No wheezing, rhonchi or rales.  Abdominal:     General: Bowel sounds are normal. There is no distension.     Palpations: Abdomen is soft.     Tenderness: There is no abdominal tenderness.  Musculoskeletal: Normal range of motion.  Skin:    General: Skin is warm and dry.     Coloration: Skin is not pale.     Findings: No rash.  Neurological:     Mental Status: She is alert.     Motor: No abnormal muscle tone.     Coordination: Coordination normal.  Psychiatric:        Mood and Affect: Mood normal.        Behavior: Behavior normal.           Assessment & Plan:      ICD-10-CM   1. Upper respiratory tract infection, unspecified type J06.9 Influenza A and B Ag, Immunoassay    Patient is a 73 year old female presents with URI symptoms, subjective fever, husband is sick with same symptoms, they both have a little bit of increased lower extremity edema despite use of diuretics, they have been laying around a lot, not tried over-the-counter treatment, symptoms have been ongoing for about 4 days.  Today she is afebrile, no respiratory  distress, lungs are clear, flu was done which was negative.  No signs of acute bacterial sinusitis, suspect viral URI, encourage symptomatic and supportive treatment.  She has no other signs of fluid overload or heart failure, has no other concerning comorbidities, follow-up as needed if not improving   [name], PA-C 01/24/18 11:21 AM

## 2018-01-24 NOTE — Patient Instructions (Signed)
Try over the counter and supportive measures, fluids, rest, decongestants, cough meds.   Upper Respiratory Infection, Adult An upper respiratory infection (URI) affects the nose, throat, and upper air passages. URIs are caused by germs (viruses). The most common type of URI is often called "the common cold." Medicines cannot cure URIs, but you can do things at home to relieve your symptoms. URIs usually get better within 7-10 days. Follow these instructions at home: Activity  Rest as needed.  If you have a fever, stay home from work or school until your fever is gone, or until your doctor says you may return to work or school. ? You should stay home until you cannot spread the infection anymore (you are not contagious). ? Your doctor may have you wear a face mask so you have less risk of spreading the infection. Relieving symptoms  Gargle with a salt-water mixture 3-4 times a day or as needed. To make a salt-water mixture, completely dissolve -1 tsp of salt in 1 cup of warm water.  Use a cool-mist humidifier to add moisture to the air. This can help you breathe more easily. Eating and drinking   Drink enough fluid to keep your pee (urine) pale yellow.  Eat soups and other clear broths. General instructions   Take over-the-counter and prescription medicines only as told by your doctor. These include cold medicines, fever reducers, and cough suppressants.  Do not use any products that contain nicotine or tobacco. These include cigarettes and e-cigarettes. If you need help quitting, ask your doctor.  Avoid being where people are smoking (avoid secondhand smoke).  Make sure you get regular shots and get the flu shot every year.  Keep all follow-up visits as told by your doctor. This is important. How to avoid spreading infection to others   Wash your hands often with soap and water. If you do not have soap and water, use hand sanitizer.  Avoid touching your mouth, face, eyes,  or nose.  Cough or sneeze into a tissue or your sleeve or elbow. Do not cough or sneeze into your hand or into the air. Contact a doctor if:  You are getting worse, not better.  You have any of these: ? A fever. ? Chills. ? Brown or red mucus in your nose. ? Yellow or brown fluid (discharge)coming from your nose. ? Pain in your face, especially when you bend forward. ? Swollen neck glands. ? Pain with swallowing. ? White areas in the back of your throat. Get help right away if:  You have shortness of breath that gets worse.  You have very bad or constant: ? Headache. ? Ear pain. ? Pain in your forehead, behind your eyes, and over your cheekbones (sinus pain). ? Chest pain.  You have long-lasting (chronic) lung disease along with any of these: ? Wheezing. ? Long-lasting cough. ? Coughing up blood. ? A change in your usual mucus.  You have a stiff neck.  You have changes in your: ? Vision. ? Hearing. ? Thinking. ? Mood. Summary  An upper respiratory infection (URI) is caused by a germ called a virus. The most common type of URI is often called "the common cold."  URIs usually get better within 7-10 days.  Take over-the-counter and prescription medicines only as told by your doctor. This information is not intended to replace advice given to you by your health care provider. Make sure you discuss any questions you have with your health care provider. Document Released: 06/23/2007  Document Revised: 08/27/2016 Document Reviewed: 08/27/2016 Elsevier Interactive Patient Education  [employer].

## 2018-01-26 ENCOUNTER — Other Ambulatory Visit: Payer: Medicare HMO

## 2018-02-06 ENCOUNTER — Other Ambulatory Visit: Payer: Self-pay | Admitting: Family Medicine

## 2018-02-06 DIAGNOSIS — R600 Localized edema: Secondary | ICD-10-CM

## 2018-02-14 ENCOUNTER — Other Ambulatory Visit: Payer: Self-pay | Admitting: *Deleted

## 2018-02-14 MED ORDER — POTASSIUM CHLORIDE CRYS ER 20 MEQ PO TBCR: 20.0000 meq | EXTENDED_RELEASE_TABLET | Freq: Every day | ORAL | 1 refills | Status: DC

## 2018-02-22 ENCOUNTER — Other Ambulatory Visit: Payer: Self-pay

## 2018-02-22 MED ORDER — ROSUVASTATIN CALCIUM 20 MG PO TABS: 20.0000 mg | ORAL_TABLET | Freq: Every day | ORAL | 0 refills | Status: DC

## 2018-02-23 ENCOUNTER — Other Ambulatory Visit: Payer: Self-pay

## 2018-02-23 MED ORDER — PANTOPRAZOLE SODIUM 40 MG PO TBEC: 40.0000 mg | DELAYED_RELEASE_TABLET | Freq: Every day | ORAL | 1 refills | Status: DC

## 2018-02-24 ENCOUNTER — Other Ambulatory Visit: Payer: Self-pay

## 2018-02-24 ENCOUNTER — Other Ambulatory Visit: Payer: Self-pay | Admitting: Family Medicine

## 2018-02-24 MED ORDER — PANTOPRAZOLE SODIUM 40 MG PO TBEC: 40.0000 mg | DELAYED_RELEASE_TABLET | Freq: Every day | ORAL | 1 refills | Status: DC

## 2018-03-07 ENCOUNTER — Ambulatory Visit
Admission: RE | Admit: 2018-03-07 | Discharge: 2018-03-07 | Disposition: A | Discharge status: Home / Self Care | Payer: Medicare HMO | Source: Ambulatory Visit | Attending: Family Medicine | Admitting: Family Medicine

## 2018-03-07 DIAGNOSIS — Z1231 Encounter for screening mammogram for malignant neoplasm of breast: Secondary | ICD-10-CM | POA: Diagnosis not present

## 2018-03-07 DIAGNOSIS — M85851 Other specified disorders of bone density and structure, right thigh: Secondary | ICD-10-CM | POA: Insufficient documentation

## 2018-03-07 DIAGNOSIS — Z780 Asymptomatic menopausal state: Secondary | ICD-10-CM | POA: Diagnosis not present

## 2018-03-07 DIAGNOSIS — Z13820 Encounter for screening for osteoporosis: Secondary | ICD-10-CM | POA: Insufficient documentation

## 2018-03-07 DIAGNOSIS — Z1239 Encounter for other screening for malignant neoplasm of breast: Secondary | ICD-10-CM | POA: Diagnosis present

## 2018-03-09 ENCOUNTER — Other Ambulatory Visit: Payer: Self-pay | Admitting: Family Medicine

## 2018-04-10 ENCOUNTER — Other Ambulatory Visit: Payer: Self-pay | Admitting: Family Medicine

## 2018-04-10 DIAGNOSIS — R600 Localized edema: Secondary | ICD-10-CM

## 2018-04-11 ENCOUNTER — Ambulatory Visit (INDEPENDENT_AMBULATORY_CARE_PROVIDER_SITE_OTHER): Payer: Medicare HMO | Admitting: Family Medicine

## 2018-04-11 ENCOUNTER — Encounter: Payer: Self-pay | Admitting: Family Medicine

## 2018-04-11 ENCOUNTER — Other Ambulatory Visit: Payer: Self-pay

## 2018-04-11 VITALS — BP 112/70 | HR 81 | Temp 97.8°F | Resp 16 | Ht 65.0 in | Wt 220.0 lb

## 2018-04-11 DIAGNOSIS — Z6836 Body mass index (BMI) 36.0-36.9, adult: Secondary | ICD-10-CM | POA: Diagnosis not present

## 2018-04-11 DIAGNOSIS — R600 Localized edema: Secondary | ICD-10-CM

## 2018-04-11 DIAGNOSIS — K219 Gastro-esophageal reflux disease without esophagitis: Secondary | ICD-10-CM

## 2018-04-11 DIAGNOSIS — I10 Essential (primary) hypertension: Secondary | ICD-10-CM | POA: Diagnosis not present

## 2018-04-11 DIAGNOSIS — E119 Type 2 diabetes mellitus without complications: Secondary | ICD-10-CM | POA: Diagnosis not present

## 2018-04-11 DIAGNOSIS — M25512 Pain in left shoulder: Secondary | ICD-10-CM | POA: Diagnosis not present

## 2018-04-11 DIAGNOSIS — E661 Drug-induced obesity: Secondary | ICD-10-CM

## 2018-04-11 DIAGNOSIS — E785 Hyperlipidemia, unspecified: Secondary | ICD-10-CM

## 2018-04-11 DIAGNOSIS — M25511 Pain in right shoulder: Secondary | ICD-10-CM | POA: Diagnosis not present

## 2018-04-11 MED ORDER — LISINOPRIL 10 MG PO TABS: 10.0000 mg | ORAL_TABLET | Freq: Every day | ORAL | 1 refills | Status: DC

## 2018-04-11 MED ORDER — METFORMIN HCL 1000 MG PO TABS: 1000.0000 mg | ORAL_TABLET | Freq: Two times a day (BID) | ORAL | 1 refills | Status: DC

## 2018-04-11 MED ORDER — HYDROCHLOROTHIAZIDE 25 MG PO TABS: 25.0000 mg | ORAL_TABLET | Freq: Every day | ORAL | 1 refills | Status: DC

## 2018-04-11 MED ORDER — ROSUVASTATIN CALCIUM 20 MG PO TABS: 20.0000 mg | ORAL_TABLET | Freq: Every day | ORAL | 1 refills | Status: DC

## 2018-04-11 MED ORDER — ACCU-CHEK SOFTCLIX LANCETS MISC: 5 refills | Status: DC

## 2018-04-11 MED ORDER — GLUCOSE BLOOD VI STRP: ORAL_STRIP | 12 refills | Status: DC

## 2018-04-11 NOTE — Assessment & Plan Note (Signed)
Compliant with meds, metformin 1000 mg BID, no SE Recheck A1C, CMP, lipids  On ACEI, statin Continue to monitor fasting blood sugar Continue to work on heart healthy diet (avoid simple sugars, sweets, food high in saturated fat and cholesterol, avoid additional salt, increase fiber, whole grains, vegetables and increase exercise)  Foot exam done Eye exam done - get records

## 2018-04-11 NOTE — Progress Notes (Signed)
Patient ID: Tamara [name], female    DOB: 1945/11/18, 73 y.o.   MRN: [id number]  PCP: [name], PA-C  Chief Complaint  Patient presents with  . Hypertension  . Diabetes    Subjective:   Tamara [name] is a 73 y.o. female, presents to clinic with CC of routine f/up for multiple conditions, is fasting, due for labs.  HTN usually runs 120/70's - high today, at 146/84 - she reports that she held her arm out at a 90 degree angle, holding it up in the air, the high reading is "freaking her out".  Repeated is 112/70.  She is taking her blood pressure medications as prescribed, hydrochlorothiazide and lisinopril, she has had no side effects, no low blood pressure, lightheadedness, syncopal episodes, chest pain, lower extremity swelling, exertional symptoms or shortness of breath.  HLD-patient states she did not take her Crestor for several months because she was eating grapefruit however she did resume taking it about a month ago.  She notes that when she took a break from statin she has less joint pain, when she started it again she had pain again in knees and shoulders b/l.  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  The pain is limited to her joints seems more noticeable when taking a statin and improved when not taking a statin.  She denies any myalgias, jaundice, fatigue.    DM - on metformin 1000 mg BID, taking as prescribed, sugars have been "higher" - 130-145s in the morning, has not been doing her diet lately.  She denies any SE of meds.   She did get her eye exam done in [place], [place] - will request record.  She says it was good.  She has not had any visual disturbances, weight loss, polydipsia, polyuria.  Mammogram and bone density done   Wt Readings from Last 5 Encounters:  04/11/18 220 lb (99.8 kg)  01/24/18 213 lb 6 oz (96.8 kg)  12/07/17 214 lb 2 oz (97.1 kg)  09/05/17  217 lb 9.6 oz (98.7 kg)  05/26/17 214 lb 9.6 oz (97.3 kg)   Weight up a little bit, having trouble working on diet, not exercising.  She reports she was told to take calcium - she has been taking vit D and B12.  GERD - she continues to take protonix with good control of her reflux sx.  She is no longer requiring any additional H2 blocker or Carafate    Patient Active Problem List   Diagnosis Date Noted  . Class 1 obesity with body mass index (BMI) of 34.0 to 34.9 in adult 12/07/2017  . Anemia of chronic disease 09/05/2017  . Gastritis 09/05/2017  . Colon polyps 09/05/2017  . Diabetes mellitus type 2, uncomplicated ([place]) 71/06/2692  . Hypertension 07/14/2016  . Hyperlipemia 07/14/2016  . Bilateral lower extremity edema 07/14/2016  . GERD (gastroesophageal reflux disease) 07/14/2016  . Screening for colorectal cancer 07/14/2016    Current Meds  Medication Sig  . ACCU-CHEK SOFTCLIX LANCETS lancets TEST UP TO FOUR TIMES DAILY AS DIRECTED. FOR TYPE 2 DIABETES MELLITUS.  Marland Kitchen Alcohol Swabs (B-D SINGLE USE SWABS REGULAR) PADS Use to check blood sugar twice daily.  . blood glucose meter kit and supplies KIT Dispense based on patient and insurance preference. Use up to four times daily as directed. (FOR ICD-10 E11.9).  Marland Kitchen glucose blood test strip Use as instructed  to check blood sugar twice daily  . hydrochlorothiazide (HYDRODIURIL) 25 MG tablet TAKE 1 TABLET EVERY DAY  . lisinopril (PRINIVIL,ZESTRIL) 10 MG tablet TAKE 1 TABLET EVERY DAY  . metFORMIN (GLUCOPHAGE) 1000 MG tablet Take 1 tablet (1,000 mg total) by mouth 2 (two) times daily with a meal.  . pantoprazole (PROTONIX) 40 MG tablet Take 1 tablet (40 mg total) by mouth daily.  . potassium chloride SA (K-DUR,KLOR-CON) 20 MEQ tablet Take 1 tablet (20 mEq total) by mouth daily.  . rosuvastatin (CRESTOR) 20 MG tablet Take 1 tablet (20 mg total) by mouth daily.     Review of Systems  Constitutional: Negative.  Negative for activity  change, appetite change, fatigue and fever.  HENT: Negative.   Eyes: Negative.   Respiratory: Negative.  Negative for cough, chest tightness, shortness of breath and wheezing.   Cardiovascular: Negative.  Negative for chest pain, palpitations and leg swelling.  Gastrointestinal: Negative.  Negative for abdominal pain, diarrhea and vomiting.  Endocrine: Negative.   Genitourinary: Negative.  Negative for decreased urine volume.  Musculoskeletal: Positive for arthralgias. Negative for myalgias.  Skin: Negative.  Negative for color change, pallor and rash.  Allergic/Immunologic: Negative.   Neurological: Negative.  Negative for tremors, syncope, weakness, light-headedness and headaches.  Hematological: Negative.  Negative for adenopathy.  Psychiatric/Behavioral: Negative.   All other systems reviewed and are negative.      Objective:    Vitals:   04/11/18 0818  BP: (!) 146/84  Pulse: 81  Resp: 16  Temp: 97.8 F (36.6 C)  SpO2: 98%  Weight: 220 lb (99.8 kg)  Height: 5' 5" (1.651 m)      Physical Exam Vitals signs and nursing note reviewed.  Constitutional:      General: She is not in acute distress.    Appearance: Normal appearance. She is well-developed. She is obese. She is not ill-appearing, toxic-appearing or diaphoretic.     Comments: Well-appearing, pleasant elderly female, appears younger than stated age, slightly anxious appearing, wears mask that she brought from home  HENT:     Head: Normocephalic and atraumatic.     Right Ear: External ear normal.     Left Ear: External ear normal.     Nose: Nose normal. No congestion or rhinorrhea.     Mouth/Throat:     Mouth: Mucous membranes are moist.     Pharynx: Oropharynx is clear. Uvula midline.  Eyes:     General: Lids are normal. No scleral icterus.    Conjunctiva/sclera: Conjunctivae normal.     Pupils: Pupils are equal, round, and reactive to light.  Neck:     Musculoskeletal: Normal range of motion and neck  supple.     Trachea: Phonation normal. No tracheal deviation.  Cardiovascular:     Rate and Rhythm: Normal rate and regular rhythm.     Pulses: Normal pulses.          Radial pulses are 2+ on the right side and 2+ on the left side.       Posterior tibial pulses are 2+ on the right side and 2+ on the left side.     Heart sounds: Normal heart sounds. No murmur. No friction rub. No gallop.   Pulmonary:     Effort: Pulmonary effort is normal. No respiratory distress.     Breath sounds: Normal breath sounds. No stridor. No wheezing, rhonchi or rales.  Chest:     Chest wall: No tenderness.  Abdominal:     General:  Bowel sounds are normal. There is no distension.     Palpations: Abdomen is soft.     Tenderness: There is no abdominal tenderness. There is no guarding or rebound.  Musculoskeletal: Normal range of motion.        General: No deformity.     Right lower leg: No edema.     Left lower leg: No edema.  Lymphadenopathy:     Cervical: No cervical adenopathy.  Skin:    General: Skin is warm and dry.     Capillary Refill: Capillary refill takes less than 2 seconds.     Coloration: Skin is not pale.     Findings: No rash.  Neurological:     Mental Status: She is alert and oriented to person, place, and time.     Motor: No abnormal muscle tone.     Gait: Gait normal.  Psychiatric:        Speech: Speech normal.        Behavior: Behavior normal.           Assessment & Plan:   73 y/o female here for f/up on HTN, HLD, DM, obesity, GERD: No other acute issues but she complains of worsening joint pain - no myaglias, jaundice, fatigue/malaise, will recheck LFT's and add CK  Problem List Items Addressed This Visit      Cardiovascular and Mediastinum   Hypertension - Primary    Well controlled and at goal today 112/70 Taking meds as prescribed, no SE Check CMP Requests longer refill on meds Continue to monitor at home Encouraged to work on lifestyle changes, diet, exercise, low  salt F/up 4 months      Relevant Medications   hydrochlorothiazide (HYDRODIURIL) 25 MG tablet   lisinopril (PRINIVIL,ZESTRIL) 10 MG tablet   rosuvastatin (CRESTOR) 20 MG tablet   Other Relevant Orders   CBC with Differential/Platelet   COMPLETE METABOLIC PANEL WITH GFR   Hemoglobin A1c   Lipid panel     Digestive   GERD (gastroesophageal reflux disease)    Improved and well controlled with protonix Continue protonix as needed        Endocrine   Diabetes mellitus type 2, uncomplicated ([facility]CC)    Compliant with meds, metformin 1000 mg BID, no SE Recheck A1C, CMP, lipids  On ACEI, statin Continue to monitor fasting blood sugar Continue to work on heart healthy diet (avoid simple sugars, sweets, food high in saturated fat and cholesterol, avoid additional salt, increase fiber, whole grains, vegetables and increase exercise)  Foot exam done Eye exam done - get records      Relevant Medications   Accu-Chek Softclix Lancets lancets   glucose blood test strip   lisinopril (PRINIVIL,ZESTRIL) 10 MG tablet   metFORMIN (GLUCOPHAGE) 1000 MG tablet   rosuvastatin (CRESTOR) 20 MG tablet   Other Relevant Orders   CBC with Differential/Platelet   COMPLETE METABOLIC PANEL WITH GFR   Hemoglobin A1c   Lipid panel     Other   Hyperlipemia   Relevant Medications   hydrochlorothiazide (HYDRODIURIL) 25 MG tablet   lisinopril (PRINIVIL,ZESTRIL) 10 MG tablet   rosuvastatin (CRESTOR) 20 MG tablet   Other Relevant Orders   CBC with Differential/Platelet   COMPLETE METABOLIC PANEL WITH GFR   Hemoglobin A1c   Lipid panel   CK   Bilateral lower extremity edema   Relevant Medications   hydrochlorothiazide (HYDRODIURIL) 25 MG tablet   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

## 2018-04-11 NOTE — Assessment & Plan Note (Signed)
Weight increasing - see above for diet/exercise/lifestyle recommendations

## 2018-04-11 NOTE — Assessment & Plan Note (Signed)
Well controlled and at goal today 112/70 Taking meds as prescribed, no SE Check CMP Requests longer refill on meds Continue to monitor at home Encouraged to work on lifestyle changes, diet, exercise, low salt F/up 4 months

## 2018-04-11 NOTE — Assessment & Plan Note (Signed)
Improved and well controlled with protonix Continue protonix as needed

## 2018-04-12 LAB — CBC WITH DIFFERENTIAL/PLATELET
Absolute Monocytes: 415 cells/uL (ref 200–950)
Basophils Absolute: 27 cells/uL (ref 0–200)
Basophils Relative: 0.4 %
Eosinophils Absolute: 348 cells/uL (ref 15–500)
Eosinophils Relative: 5.2 %
HEMATOCRIT: 33.6 % — AB (ref 35.0–45.0)
Hemoglobin: 11.4 g/dL — ABNORMAL LOW (ref 11.7–15.5)
LYMPHS ABS: 1983 {cells}/uL (ref 850–3900)
MCH: 28.6 pg (ref 27.0–33.0)
MCHC: 33.9 g/dL (ref 32.0–36.0)
MCV: 84.4 fL (ref 80.0–100.0)
MPV: 10.8 fL (ref 7.5–12.5)
Monocytes Relative: 6.2 %
Neutro Abs: 3926 cells/uL (ref 1500–7800)
Neutrophils Relative %: 58.6 %
Platelets: 176 10*3/uL (ref 140–400)
RBC: 3.98 10*6/uL (ref 3.80–5.10)
RDW: 13.3 % (ref 11.0–15.0)
Total Lymphocyte: 29.6 %
WBC: 6.7 10*3/uL (ref 3.8–10.8)

## 2018-04-12 LAB — COMPLETE METABOLIC PANEL WITH GFR
AG Ratio: 1.7 (calc) (ref 1.0–2.5)
ALT: 17 U/L (ref 6–29)
AST: 20 U/L (ref 10–35)
Albumin: 4.4 g/dL (ref 3.6–5.1)
Alkaline phosphatase (APISO): 42 U/L (ref 37–153)
BUN/Creatinine Ratio: 17 (calc) (ref 6–22)
BUN: 17 mg/dL (ref 7–25)
CO2: 26 mmol/L (ref 20–32)
CREATININE: 0.99 mg/dL — AB (ref 0.60–0.93)
Calcium: 9.7 mg/dL (ref 8.6–10.4)
Chloride: 103 mmol/L (ref 98–110)
GFR, Est African American: 66 mL/min/{1.73_m2} (ref >=60)
GFR, Est Non African American: 57 mL/min/{1.73_m2} — ABNORMAL LOW (ref >=60)
Globulin: 2.6 g/dL (calc) (ref 1.9–3.7)
Glucose, Bld: 129 mg/dL — ABNORMAL HIGH (ref 65–99)
POTASSIUM: 4.6 mmol/L (ref 3.5–5.3)
Sodium: 138 mmol/L (ref 135–146)
Total Bilirubin: 0.3 mg/dL (ref 0.2–1.2)
Total Protein: 7 g/dL (ref 6.1–8.1)

## 2018-04-12 LAB — LIPID PANEL
Cholesterol: 158 mg/dL (ref <200)
HDL: 52 mg/dL (ref >=50)
LDL Cholesterol (Calc): 78 mg/dL (calc)
Non-HDL Cholesterol (Calc): 106 mg/dL (calc) (ref <130)
Total CHOL/HDL Ratio: 3 (calc) (ref <5.0)
Triglycerides: 181 mg/dL — ABNORMAL HIGH (ref <150)

## 2018-04-12 LAB — HEMOGLOBIN A1C
Hgb A1c MFr Bld: 7.1 % of total Hgb — ABNORMAL HIGH (ref <5.7)
Mean Plasma Glucose: 157 (calc)
eAG (mmol/L): 8.7 (calc)

## 2018-04-12 LAB — CK: Total CK: 71 U/L (ref 29–143)

## 2018-04-24 ENCOUNTER — Encounter: Payer: Self-pay | Admitting: Family Medicine

## 2018-05-05 ENCOUNTER — Other Ambulatory Visit: Payer: Self-pay

## 2018-05-05 MED ORDER — BD SWAB SINGLE USE REGULAR PADS: MEDICATED_PAD | 5 refills | Status: DC

## 2018-06-08 ENCOUNTER — Other Ambulatory Visit: Payer: Self-pay | Admitting: Family Medicine

## 2018-06-08 DIAGNOSIS — E119 Type 2 diabetes mellitus without complications: Secondary | ICD-10-CM

## 2018-08-10 ENCOUNTER — Ambulatory Visit: Payer: Medicare HMO | Admitting: Family Medicine

## 2018-08-11 ENCOUNTER — Ambulatory Visit: Payer: Medicare HMO | Admitting: Family Medicine

## 2018-08-14 ENCOUNTER — Other Ambulatory Visit: Payer: Self-pay

## 2018-08-15 ENCOUNTER — Encounter: Payer: Self-pay | Admitting: Family Medicine

## 2018-08-15 ENCOUNTER — Ambulatory Visit (INDEPENDENT_AMBULATORY_CARE_PROVIDER_SITE_OTHER): Payer: Medicare HMO | Admitting: Family Medicine

## 2018-08-15 VITALS — BP 130/80 | HR 84 | Temp 97.8°F | Resp 16 | Ht 65.0 in | Wt 228.1 lb

## 2018-08-15 DIAGNOSIS — E661 Drug-induced obesity: Secondary | ICD-10-CM

## 2018-08-15 DIAGNOSIS — I10 Essential (primary) hypertension: Secondary | ICD-10-CM

## 2018-08-15 DIAGNOSIS — Z6837 Body mass index (BMI) 37.0-37.9, adult: Secondary | ICD-10-CM | POA: Diagnosis not present

## 2018-08-15 DIAGNOSIS — E1165 Type 2 diabetes mellitus with hyperglycemia: Secondary | ICD-10-CM

## 2018-08-15 DIAGNOSIS — M545 Low back pain, unspecified: Secondary | ICD-10-CM

## 2018-08-15 DIAGNOSIS — E785 Hyperlipidemia, unspecified: Secondary | ICD-10-CM | POA: Diagnosis not present

## 2018-08-15 DIAGNOSIS — E118 Type 2 diabetes mellitus with unspecified complications: Secondary | ICD-10-CM

## 2018-08-15 DIAGNOSIS — E119 Type 2 diabetes mellitus without complications: Secondary | ICD-10-CM | POA: Diagnosis not present

## 2018-08-15 DIAGNOSIS — R600 Localized edema: Secondary | ICD-10-CM

## 2018-08-15 DIAGNOSIS — IMO0002 Reserved for concepts with insufficient information to code with codable children: Secondary | ICD-10-CM

## 2018-08-15 MED ORDER — METHOCARBAMOL 500 MG PO TABS: 500.0000 mg | ORAL_TABLET | Freq: Three times a day (TID) | ORAL | 1 refills | Status: DC | PRN

## 2018-08-15 MED ORDER — MELOXICAM 7.5 MG PO TABS: 7.5000 mg | ORAL_TABLET | Freq: Every day | ORAL | 0 refills | Status: DC

## 2018-08-15 NOTE — Progress Notes (Signed)
Patient ID: Tamara [name], female    DOB: 01-Dec-1945, 73 y.o.   MRN: [id number]  PCP: [name], PA-C  Chief Complaint  Patient presents with  . Hypertension    Subjective:   Tamara [name] is a 73 y.o. female, presents to clinic with CC of routine f/up for HTN, DM, obesity, HLD.  Also complains of back pain and LE edema  Le swelling worse, usually swells more in the summer and gets better in cooler weather, limited mostly to feet, no worse than normal.  She doesn't wear compression stockings.  Goes down over night, and swelling is only pitting where her sandle straps are, no where else.  She laughs and reports her weight is up - not on "diet" and her blood sugars are very high as well.  States her fasting averages are over 200.  She is compliant with metformin w/o se.  Not exercising right now due to pain, and admits to over eating.  Back pain severe, daily, with any activity, told in the past needs back surgery after having some x-rays with old PCP.     Patient Active Problem List   Diagnosis Date Noted  . 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 Anemia of chronic disease 09/05/2017  . Colon polyps 09/05/2017  . Diabetes mellitus type 2, uncomplicated ([place]) 70/62/3762  . Hypertension 07/14/2016  . Hyperlipemia 07/14/2016  . Bilateral lower extremity edema 07/14/2016  . GERD (gastroesophageal reflux disease) 07/14/2016  . Screening for colorectal cancer 07/14/2016    Current Meds  Medication Sig  . Accu-Chek Softclix Lancets lancets TEST UP TO FOUR TIMES DAILY AS DIRECTED. FOR TYPE 2 DIABETES MELLITUS.  Marland Kitchen Alcohol Swabs (B-D SINGLE USE SWABS REGULAR) PADS Use to check blood sugar twice daily.  . blood glucose meter kit and supplies KIT Dispense based on patient and insurance preference. Use up to four times daily as directed. (FOR ICD-10 E11.9).  Marland Kitchen glucose blood test strip Use as instructed to check blood sugar  twice daily  . hydrochlorothiazide (HYDRODIURIL) 25 MG tablet Take 1 tablet (25 mg total) by mouth daily.  Marland Kitchen lisinopril (PRINIVIL,ZESTRIL) 10 MG tablet Take 1 tablet (10 mg total) by mouth daily.  . metFORMIN (GLUCOPHAGE) 1000 MG tablet Take 1 tablet (1,000 mg total) by mouth 2 (two) times daily with a meal.  . pantoprazole (PROTONIX) 40 MG tablet Take 1 tablet (40 mg total) by mouth daily.  . potassium chloride SA (K-DUR,KLOR-CON) 20 MEQ tablet Take 1 tablet (20 mEq total) by mouth daily.  . rosuvastatin (CRESTOR) 20 MG tablet Take 1 tablet (20 mg total) by mouth at bedtime.     Review of Systems     Objective:    Vitals:   08/15/18 0813  BP: 130/80  Pulse: 84  Resp: 16  Temp: 97.8 F (36.6 C)  TempSrc: Oral  SpO2: 95%  Weight: 228 lb 2 oz (103.5 kg)  Height: 5' 5" (1.651 m)      Physical Exam Constitutional:      General: She is not in acute distress.    Appearance: Normal appearance. She is well-developed. She is obese. She is not ill-appearing, toxic-appearing or diaphoretic.  HENT:     Head: Normocephalic and atraumatic.     Right Ear: External ear normal.     Left Ear: External ear normal.     Nose: Nose normal.     Mouth/Throat:  Mouth: Mucous membranes are moist.     Pharynx: Oropharynx is clear. Uvula midline.  Eyes:     General: Lids are normal. No scleral icterus.    Conjunctiva/sclera: Conjunctivae normal.     Pupils: Pupils are equal, round, and reactive to light.  Neck:     Musculoskeletal: Normal range of motion and neck supple.     Trachea: Phonation normal. No tracheal deviation.  Cardiovascular:     Rate and Rhythm: Normal rate and regular rhythm.     Pulses: Normal pulses.          Radial pulses are 2+ on the right side and 2+ on the left side.       Posterior tibial pulses are 2+ on the right side and 2+ on the left side.     Heart sounds: Normal heart sounds. No murmur. No friction rub. No gallop.   Pulmonary:     Effort: Pulmonary effort  is normal. No respiratory distress.     Breath sounds: Normal breath sounds. No stridor. No wheezing, rhonchi or rales.  Chest:     Chest wall: No tenderness.  Abdominal:     General: Bowel sounds are normal. There is no distension.     Palpations: Abdomen is soft.     Tenderness: There is no abdominal tenderness. There is no right CVA tenderness, left CVA tenderness, guarding or rebound.  Musculoskeletal: Normal range of motion.        General: No deformity.  Lymphadenopathy:     Cervical: No cervical adenopathy.  Skin:    General: Skin is warm and dry.     Capillary Refill: Capillary refill takes less than 2 seconds.     Coloration: Skin is not pale.     Findings: No rash.  Neurological:     Mental Status: She is alert and oriented to person, place, and time.     Motor: No abnormal muscle tone.     Gait: Gait normal.  Psychiatric:        Speech: Speech normal.        Behavior: Behavior normal.           Assessment & Plan:   Problem List Items Addressed This Visit      Cardiovascular and Mediastinum   Hypertension    HTN continues to be controlled, though BP is higher than last time, possibly due to increased weight and worse diet, but at 130/80 at goal.  She is compliant with meds, having some LE edema which she commonly does in the summer, no worse than what is common for her. Recheck renal function and electrolytes DASH diet, encouraged calorie reduction and exercise F/up in 4 months, overdue for MWV      Relevant Orders   COMPLETE METABOLIC PANEL WITH GFR (Completed)   Referral to Nutrition and Diabetes Services     Endocrine   Diabetes mellitus type 2 with complications, uncontrolled ([place]) - Primary    Compliant with metformin, no SE Blood sugars much worse, diet and weight worse as well Reports fasting CBG's 200 and up Recheck A1C, CMP  Decrease sweets, excess calories, work on diet and exercise as able      Relevant Orders   Hemoglobin A1c (Completed)    COMPLETE METABOLIC PANEL WITH GFR (Completed)   Lipid panel (Completed)   Referral to Nutrition and Diabetes Services     Other   Hyperlipemia    Excellent compliance with statin, no side effects reported, last labs showed  low HDL and elevated triglycerides secondary to hyperglycemia, will recheck today      Relevant Orders   COMPLETE METABOLIC PANEL WITH GFR (Completed)   Lipid panel (Completed)   Referral to Nutrition and Diabetes Services   Bilateral lower extremity edema    Worse with heat, dependent, still taking HCTZ, no pitting, no acute weight gain or fluid retention, but gradual weight gain due to poor diet She denies orthopnea, PND, palpitations, chest pain or shortness of breath Does not wear compression stockings and has no desire to Advise continue same medicines, elevate legs at night, continue physical activity as tolerated during the day, DASH diet      Relevant Orders   COMPLETE METABOLIC PANEL WITH GFR (Completed)   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    Worsening obesity with 15 pound weight gain since last year, admittedly worse diet, see above for diet and exercise recommendations        Relevant Orders   Referral to Nutrition and Diabetes Services    Other Visit Diagnoses    Low back pain without sciatica, unspecified back pain laterality, unspecified chronicity       reported as chronic, no hx or imaging available, worse daily with activity, no sciatica or radicular sx, conservative tx, f/up may need PT/MRI etc   Relevant Medications   meloxicam (MOBIC) 7.5 MG tablet   methocarbamol (ROBAXIN) 500 MG tablet       f/up on back pain if not improving in ~ 6 weeks  [name], PA-C 08/15/18 8:19 AM

## 2018-08-15 NOTE — Patient Instructions (Addendum)
Schedule your next visit for a medicare well visit Can schedule here or at the other office if you would like to move over there  [address] [place], [place] [address]  Main: [phone]  Call next week if you would like to transfer to the other clinic  Work on the diet and exercise Will help with the blood sugars, blood pressure and weight  This will also help with joint pain/back pain   Chronic Back Pain When back pain lasts longer than 3 months, it is called chronic back pain. Pain may get worse at certain times (flare-ups). There are things you can do at home to manage your pain. Follow these instructions at home: Activity      Avoid bending and other activities that make pain worse.  When standing: ? Keep your upper back and neck straight. ? Keep your shoulders pulled back. ? Avoid slouching.  When sitting: ? Keep your back straight. ? Relax your shoulders. Do not round your shoulders or pull them backward.  Do not sit or stand in one place for long periods of time.  Take short rest breaks during the day. Lying down or standing is usually better than sitting. Resting can help relieve pain.  When sitting or lying down for a long time, do some mild activity or stretching. This will help to prevent stiffness and pain.  Get regular exercise. Ask your doctor what activities are safe for you.  Do not lift anything that is heavier than 10 lb (4.5 kg). To prevent injury when you lift things: ? Bend your knees. ? Keep the weight close to your body. ? Avoid twisting. Managing pain  If told, put ice on the painful area. Your doctor may tell you to use ice for 24-48 hours after a flare-up starts. ? Put ice in a plastic bag. ? Place a towel between your skin and the bag. ? Leave the ice on for 20 minutes, 2-3 times a day.  If told, put heat on the painful area as often as told by your doctor. Use the heat source that your doctor recommends, such as a moist  heat pack or a heating pad. ? Place a towel between your skin and the heat source. ? Leave the heat on for 20-30 minutes. ? Remove the heat if your skin turns bright red. This is especially important if you are unable to feel pain, heat, or cold. You may have a greater risk of getting burned.  Soak in a warm bath. This can help relieve pain.  Take over-the-counter and prescription medicines only as told by your doctor. General instructions  Sleep on a firm mattress. Try lying on your side with your knees slightly bent. If you lie on your back, put a pillow under your knees.  Keep all follow-up visits as told by your doctor. This is important. Contact a doctor if:  You have pain that does not get better with rest or medicine. Get help right away if:  One or both of your arms or legs feel weak.  One or both of your arms or legs lose feeling (numbness).  You have trouble controlling when you poop (bowel movement) or pee (urinate).  You feel sick to your stomach (nauseous).  You throw up (vomit).  You have belly (abdominal) pain.  You have shortness of breath.  You pass out (faint). Summary  When back pain lasts longer than 3 months, it is called chronic back pain.  Pain may  get worse at certain times (flare-ups).  Use ice and heat as told by your doctor. Your doctor may tell you to use ice after flare-ups. This information is not intended to replace advice given to you by your health care provider. Make sure you discuss any questions you have with your health care provider. Document Released: 06/23/2007 Document Revised: 04/27/2018 Document Reviewed: 08/19/2016 Elsevier Patient Education  2020 [employer].

## 2018-08-16 ENCOUNTER — Encounter: Payer: Self-pay | Admitting: Family Medicine

## 2018-08-16 LAB — COMPLETE METABOLIC PANEL WITH GFR
AG Ratio: 1.7 (calc) (ref 1.0–2.5)
ALT: 23 U/L (ref 6–29)
AST: 32 U/L (ref 10–35)
Albumin: 4.3 g/dL (ref 3.6–5.1)
Alkaline phosphatase (APISO): 47 U/L (ref 37–153)
BUN/Creatinine Ratio: 16 (calc) (ref 6–22)
BUN: 16 mg/dL (ref 7–25)
CO2: 26 mmol/L (ref 20–32)
Calcium: 10.1 mg/dL (ref 8.6–10.4)
Chloride: 100 mmol/L (ref 98–110)
Creat: 1.03 mg/dL — ABNORMAL HIGH (ref 0.60–0.93)
GFR, Est African American: 63 mL/min/{1.73_m2} (ref >=60)
GFR, Est Non African American: 55 mL/min/{1.73_m2} — ABNORMAL LOW (ref >=60)
Globulin: 2.6 g/dL (calc) (ref 1.9–3.7)
Glucose, Bld: 200 mg/dL — ABNORMAL HIGH (ref 65–99)
Potassium: 4.7 mmol/L (ref 3.5–5.3)
Sodium: 137 mmol/L (ref 135–146)
Total Bilirubin: 0.4 mg/dL (ref 0.2–1.2)
Total Protein: 6.9 g/dL (ref 6.1–8.1)

## 2018-08-16 LAB — LIPID PANEL
Cholesterol: 160 mg/dL (ref <200)
HDL: 38 mg/dL — ABNORMAL LOW (ref >=50)
LDL Cholesterol (Calc): 92 mg/dL (calc)
Non-HDL Cholesterol (Calc): 122 mg/dL (calc) (ref <130)
Total CHOL/HDL Ratio: 4.2 (calc) (ref <5.0)
Triglycerides: 205 mg/dL — ABNORMAL HIGH (ref <150)

## 2018-08-16 LAB — HEMOGLOBIN A1C
Hgb A1c MFr Bld: 9.4 % of total Hgb — ABNORMAL HIGH (ref <5.7)
Mean Plasma Glucose: 223 (calc)
eAG (mmol/L): 12.4 (calc)

## 2018-08-16 MED ORDER — TRULICITY 0.75 MG/0.5ML ~~LOC~~ SOAJ: 0.7500 mg | SUBCUTANEOUS | 1 refills | Status: DC

## 2018-08-16 MED ORDER — TRULICITY 0.75 MG/0.5ML ~~LOC~~ SOAJ: 0.7500 mg | SUBCUTANEOUS | 1 refills | Status: AC

## 2018-08-16 NOTE — Assessment & Plan Note (Signed)
Worsening obesity with 15 pound weight gain since last year, admittedly worse diet, see above for diet and exercise recommendations

## 2018-08-16 NOTE — Assessment & Plan Note (Signed)
Worse with heat, dependent, still taking HCTZ, no pitting, no acute weight gain or fluid retention, but gradual weight gain due to poor diet She denies orthopnea, PND, palpitations, chest pain or shortness of breath Does not wear compression stockings and has no desire to Advise continue same medicines, elevate legs at night, continue physical activity as tolerated during the day, DASH diet

## 2018-08-16 NOTE — Assessment & Plan Note (Signed)
Compliant with metformin, no SE Blood sugars much worse, diet and weight worse as well Reports fasting CBG's 200 and up Recheck A1C, CMP  Decrease sweets, excess calories, work on diet and exercise as able

## 2018-08-16 NOTE — Assessment & Plan Note (Signed)
Excellent compliance with statin, no side effects reported, last labs showed low HDL and elevated triglycerides secondary to hyperglycemia, will recheck today

## 2018-08-16 NOTE — Assessment & Plan Note (Signed)
HTN continues to be controlled, though BP is higher than last time, possibly due to increased weight and worse diet, but at 130/80 at goal.  She is compliant with meds, having some LE edema which she commonly does in the summer, no worse than what is common for her. Recheck renal function and electrolytes DASH diet, encouraged calorie reduction and exercise F/up in 4 months, overdue for [facility]

## 2018-08-17 ENCOUNTER — Other Ambulatory Visit: Payer: Self-pay | Admitting: Family Medicine

## 2018-08-23 ENCOUNTER — Other Ambulatory Visit: Payer: Self-pay | Admitting: Family Medicine

## 2018-08-23 DIAGNOSIS — E119 Type 2 diabetes mellitus without complications: Secondary | ICD-10-CM

## 2018-08-23 DIAGNOSIS — E785 Hyperlipidemia, unspecified: Secondary | ICD-10-CM

## 2018-08-23 DIAGNOSIS — I10 Essential (primary) hypertension: Secondary | ICD-10-CM

## 2018-08-23 DIAGNOSIS — R600 Localized edema: Secondary | ICD-10-CM

## 2018-08-30 ENCOUNTER — Other Ambulatory Visit: Payer: Self-pay | Admitting: Family Medicine

## 2018-09-20 ENCOUNTER — Ambulatory Visit (INDEPENDENT_AMBULATORY_CARE_PROVIDER_SITE_OTHER): Payer: Medicare HMO | Admitting: Family Medicine

## 2018-09-20 ENCOUNTER — Encounter: Payer: Self-pay | Admitting: Family Medicine

## 2018-09-20 ENCOUNTER — Other Ambulatory Visit: Payer: Self-pay

## 2018-09-20 VITALS — BP 130/62 | HR 68 | Temp 96.8°F | Resp 14 | Ht 65.0 in | Wt 225.2 lb

## 2018-09-20 DIAGNOSIS — E1165 Type 2 diabetes mellitus with hyperglycemia: Secondary | ICD-10-CM

## 2018-09-20 DIAGNOSIS — Z23 Encounter for immunization: Secondary | ICD-10-CM | POA: Diagnosis not present

## 2018-09-20 DIAGNOSIS — IMO0002 Reserved for concepts with insufficient information to code with codable children: Secondary | ICD-10-CM

## 2018-09-20 MED ORDER — GLIMEPIRIDE 2 MG PO TABS: 2.0000 mg | ORAL_TABLET | Freq: Three times a day (TID) | ORAL | 1 refills | Status: DC | PRN

## 2018-09-20 NOTE — Assessment & Plan Note (Signed)
Recheck for CBGs today after med changes, which pt refused She is taking metformin and working on lifestyle changes Too soon to recheck A1C but fasting CBGs are much improved ranging 120-150 Will have her return in 2 months to recheck labs, asked her to call us or return sooner if CBG's are near or greater than 200

## 2018-09-20 NOTE — Patient Instructions (Addendum)
Keep up your diet efforts and we will need to see you in November to recheck   Glimiperide - only take for high blood sugars - over 160 If your blood sugars are lower than 160 I would hold the amaryl because it can cause hypoglycemia   Diabetes Mellitus and Nutrition, Adult When you have diabetes (diabetes mellitus), it is very important to have healthy eating habits because your blood sugar (glucose) levels are greatly affected by what you eat and drink. Eating healthy foods in the appropriate amounts, at about the same times every day, can help you:  Control your blood glucose.  Lower your risk of heart disease.  Improve your blood pressure.  Reach or maintain a healthy weight. Every person with diabetes is different, and each person has different needs for a meal plan. Your health care provider may recommend that you work with a diet and nutrition specialist (dietitian) to make a meal plan that is best for you. Your meal plan may vary depending on factors such as:  The calories you need.  The medicines you take.  Your weight.  Your blood glucose, blood pressure, and cholesterol levels.  Your activity level.  Other health conditions you have, such as heart or kidney disease. How do carbohydrates affect me? Carbohydrates, also called carbs, affect your blood glucose level more than any other type of food. Eating carbs naturally raises the amount of glucose in your blood. Carb counting is a method for keeping track of how many carbs you eat. Counting carbs is important to keep your blood glucose at a healthy level, especially if you use insulin or take certain oral diabetes medicines. It is important to know how many carbs you can safely have in each meal. This is different for every person. Your dietitian can help you calculate how many carbs you should have at each meal and for each snack. Foods that contain carbs include:  Bread, cereal, rice, pasta, and crackers.  Potatoes and  corn.  Peas, beans, and lentils.  Milk and yogurt.  Fruit and juice.  Desserts, such as cakes, cookies, ice cream, and candy. How does alcohol affect me? Alcohol can cause a sudden decrease in blood glucose (hypoglycemia), especially if you use insulin or take certain oral diabetes medicines. Hypoglycemia can be a life-threatening condition. Symptoms of hypoglycemia (sleepiness, dizziness, and confusion) are similar to symptoms of having too much alcohol. If your health care provider says that alcohol is safe for you, follow these guidelines:  Limit alcohol intake to no more than 1 drink per day for nonpregnant women and 2 drinks per day for men. One drink equals 12 oz of beer, 5 oz of wine, or 1 oz of hard liquor.  Do not drink on an empty stomach.  Keep yourself hydrated with water, diet soda, or unsweetened iced tea.  Keep in mind that regular soda, juice, and other mixers may contain a lot of sugar and must be counted as carbs. What are tips for following this plan?  Reading food labels  Start by checking the serving size on the "Nutrition Facts" label of packaged foods and drinks. The amount of calories, carbs, fats, and other nutrients listed on the label is based on one serving of the item. Many items contain more than one serving per package.  Check the total grams (g) of carbs in one serving. You can calculate the number of servings of carbs in one serving by dividing the total carbs by 15. For example,  if a food has 30 g of total carbs, it would be equal to 2 servings of carbs.  Check the number of grams (g) of saturated and trans fats in one serving. Choose foods that have low or no amount of these fats.  Check the number of milligrams (mg) of salt (sodium) in one serving. Most people should limit total sodium intake to less than 2,300 mg per day.  Always check the nutrition information of foods labeled as "low-fat" or "nonfat". These foods may be higher in added sugar or  refined carbs and should be avoided.  Talk to your dietitian to identify your daily goals for nutrients listed on the label. Shopping  Avoid buying canned, premade, or processed foods. These foods tend to be high in fat, sodium, and added sugar.  Shop around the outside edge of the grocery store. This includes fresh fruits and vegetables, bulk grains, fresh meats, and fresh dairy. Cooking  Use low-heat cooking methods, such as baking, instead of high-heat cooking methods like deep frying.  Cook using healthy oils, such as olive, canola, or sunflower oil.  Avoid cooking with butter, cream, or high-fat meats. Meal planning  Eat meals and snacks regularly, preferably at the same times every day. Avoid going long periods of time without eating.  Eat foods high in fiber, such as fresh fruits, vegetables, beans, and whole grains. Talk to your dietitian about how many servings of carbs you can eat at each meal.  Eat 4-6 ounces (oz) of lean protein each day, such as lean meat, chicken, fish, eggs, or tofu. One oz of lean protein is equal to: ? 1 oz of meat, chicken, or fish. ? 1 egg. ?  cup of tofu.  Eat some foods each day that contain healthy fats, such as avocado, nuts, seeds, and fish. Lifestyle  Check your blood glucose regularly.  Exercise regularly as told by your health care provider. This may include: ? 150 minutes of moderate-intensity or vigorous-intensity exercise each week. This could be brisk walking, biking, or water aerobics. ? Stretching and doing strength exercises, such as yoga or weightlifting, at least 2 times a week.  Take medicines as told by your health care provider.  Do not use any products that contain nicotine or tobacco, such as cigarettes and e-cigarettes. If you need help quitting, ask your health care provider.  Work with a Social worker or diabetes educator to identify strategies to manage stress and any emotional and social challenges. Questions to ask a  health care provider  Do I need to meet with a diabetes educator?  Do I need to meet with a dietitian?  What number can I call if I have questions?  When are the best times to check my blood glucose? Where to find more information:  American Diabetes Association: diabetes.org  Academy of Nutrition and Dietetics: www.eatright.[employer] of Diabetes and Digestive and Kidney Diseases (NIH): [employer] Summary  A healthy meal plan will help you control your blood glucose and maintain a healthy lifestyle.  Working with a diet and nutrition specialist (dietitian) can help you make a meal plan that is best for you.  Keep in mind that carbohydrates (carbs) and alcohol have immediate effects on your blood glucose levels. It is important to count carbs and to use alcohol carefully. This information is not intended to replace advice given to you by your health care provider. Make sure you discuss any questions you have with your health care provider. Document Released: 10/01/2004  Document Revised: 12/17/2016 Document Reviewed: 02/09/2016 Elsevier Patient Education  2020 [employer].

## 2018-09-20 NOTE — Progress Notes (Signed)
Name: Tamara [name]   MRN: [id number]    DOB: [birth year]   Date:09/20/2018       Progress Note  Chief Complaint  Patient presents with  . Diabetes    recheck sugars with med changes     Subjective:   Tamara [name] is a 73 y.o. female, presents to clinic for routine follow up on the conditions listed above.  Diabetes Mellitus Type II: Patient has poorly controlled diabetes, was previously only on metformin and her last labs August 15, 2018 showed increasing A1c, has been gradually worsening over the past year with worsening diet and increasing weight. She was prescribed trulicity, asked to do a nurse visit to review administration of injection, and follow up in one month to recheck fasting blood sugars.  Her result note also asked the nursing staff to mail out educational handouts with diet and lifestyle changes and hyperlipidemia diet.  She was also referred for diabetes education and nutritional referral.  There is no comments back to me on that result note and she is here today stating that she did not want to take Trulicity and never got it at the pharmacy. Currently managing with metformin only, she is still taking and tolerating without any side effects. At her last visits her blood sugars were running daily over 200, ranging 200-300. Today with dietary efforts patient states that her blood sugars have typically been running 120s to 150s in the morning, usually higher in the morning if she has had fruit the night before.  She has lost about 4 pounds and is starting to feel better.  She did not do any diabetes or nutritional training or education. Denies: Polyuria, polydipsia, polyphagia, vision changes, or neuropathy.  She states today that she is ready for fasting lab work however her labs were done only about 5 weeks ago and she will need to come back in 2 months to have a routine visit to address her chronic conditions and repeat that lab work.  Recent pertinent labs: Lab  Results  Component Value Date   HGBA1C 9.4 (H) 08/15/2018   HGBA1C 7.1 (H) 04/11/2018   HGBA1C 6.6 (H) 12/07/2017      Component Value Date/Time   NA 137 08/15/2018 0842   K 4.7 08/15/2018 0842   CL 100 08/15/2018 0842   CO2 26 08/15/2018 0842   GLUCOSE 200 (H) 08/15/2018 0842   BUN 16 08/15/2018 0842   CREATININE 1.03 (H) 08/15/2018 0842   CALCIUM 10.1 08/15/2018 0842   PROT 6.9 08/15/2018 0842   ALBUMIN 4.4 08/23/2016 0825   AST 32 08/15/2018 0842   ALT 23 08/15/2018 0842   ALKPHOS 47 08/23/2016 0825   BILITOT 0.4 08/15/2018 0842   GFRNONAA 55 (L) 08/15/2018 0842   GFRAA 63 08/15/2018 0842    Current diet: cut out sweets, carbs, bread, potatos Current exercise: none  She is UTD on DM foot exam and eye exam  Wt Readings from Last 5 Encounters:  09/20/18 225 lb 3.2 oz (102.2 kg)  08/15/18 228 lb 2 oz (103.5 kg)  04/11/18 220 lb (99.8 kg)  01/24/18 213 lb 6 oz (96.8 kg)  12/07/17 214 lb 2 oz (97.1 kg)   BMI Readings from Last 5 Encounters:  09/20/18 37.48 kg/m  08/15/18 37.96 kg/m  04/11/18 36.61 kg/m  01/24/18 34.44 kg/m  12/07/17 34.56 kg/m     Patient Active Problem List   Diagnosis Date Noted  . 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 Anemia of chronic disease 09/05/2017  . Colon polyps 09/05/2017  . Diabetes mellitus type 2 with complications, uncontrolled ([place]) 07/14/2016  . Hypertension 07/14/2016  . Hyperlipemia 07/14/2016  . Bilateral lower extremity edema 07/14/2016  . GERD (gastroesophageal reflux disease) 07/14/2016  . Screening for colorectal cancer 07/14/2016    Past Surgical History:  Procedure Laterality Date  . BREAST BIOPSY Left 2008   CORE W/CLIP - NEG  . COLONOSCOPY WITH PROPOFOL N/A 02/08/2017   Procedure: COLONOSCOPY WITH PROPOFOL;  Surgeon: Lollie Sails, MD;  Location: [facility] ENDOSCOPY;  Service: Endoscopy;  Laterality: N/A;  . ESOPHAGOGASTRODUODENOSCOPY (EGD) WITH  PROPOFOL N/A 02/08/2017   Procedure: ESOPHAGOGASTRODUODENOSCOPY (EGD) WITH PROPOFOL;  Surgeon: Lollie Sails, MD;  Location: [facility] ENDOSCOPY;  Service: Endoscopy;  Laterality: N/A;  . TUBAL LIGATION      Family History  Problem Relation Age of Onset  . Breast cancer Neg Hx     Social History   Socioeconomic History  . Marital status: Married    Spouse name: Not on file  . Number of children: Not on file  . Years of education: Not on file  . Highest education level: Not on file  Occupational History  . Not on file  Social Needs  . Financial resource strain: Not on file  . Food insecurity    Worry: Not on file    Inability: Not on file  . Transportation needs    Medical: Not on file    Non-medical: Not on file  Tobacco Use  . Smoking status: Former Research scientist (life sciences)  . Smokeless tobacco: Never Used  Substance and Sexual Activity  . Alcohol use: No  . Drug use: No  . Sexual activity: Not on file  Lifestyle  . Physical activity    Days per week: Not on file    Minutes per session: Not on file  . Stress: Not on file  Relationships  . Social Herbalist on phone: Not on file    Gets together: Not on file    Attends religious service: Not on file    Active member of club or organization: Not on file    Attends meetings of clubs or organizations: Not on file    Relationship status: Not on file  . Intimate partner violence    Fear of current or ex partner: Not on file    Emotionally abused: Not on file    Physically abused: Not on file    Forced sexual activity: Not on file  Other Topics Concern  . Not on file  Social History Narrative  . Not on file     Current Outpatient Medications:  .  Accu-Chek Softclix Lancets lancets, TEST UP TO FOUR TIMES DAILY AS DIRECTED. FOR TYPE 2 DIABETES MELLITUS., Disp: 100 each, Rfl: 5 .  Alcohol Swabs (B-D SINGLE USE SWABS REGULAR) PADS, Use to check blood sugar twice daily., Disp: 200 each, Rfl: 5 .  blood glucose meter kit and  supplies KIT, Dispense based on patient and insurance preference. Use up to four times daily as directed. (FOR ICD-10 E11.9)., Disp: 1 each, Rfl: 0 .  glucose blood test strip, Use as instructed to check blood sugar twice daily, Disp: 100 each, Rfl: 12 .  hydrochlorothiazide (HYDRODIURIL) 25 MG tablet, TAKE 1 TABLET EVERY DAY, Disp: 90 tablet, Rfl: 1 .  lisinopril (ZESTRIL) 10 MG tablet, TAKE 1 TABLET (10 MG TOTAL) BY MOUTH DAILY., Disp:  90 tablet, Rfl: 1 .  meloxicam (MOBIC) 7.5 MG tablet, Take 1 tablet (7.5 mg total) by mouth daily., Disp: 30 tablet, Rfl: 0 .  metFORMIN (GLUCOPHAGE) 1000 MG tablet, TAKE 1 TABLET (1,000 MG TOTAL) BY MOUTH 2 (TWO) TIMES DAILY WITH A MEAL., Disp: 180 tablet, Rfl: 1 .  methocarbamol (ROBAXIN) 500 MG tablet, Take 1 tablet (500 mg total) by mouth every 8 (eight) hours as needed for muscle spasms., Disp: 30 tablet, Rfl: 1 .  pantoprazole (PROTONIX) 40 MG tablet, TAKE 1 TABLET EVERY DAY, Disp: 90 tablet, Rfl: 0 .  potassium chloride SA (K-DUR) 20 MEQ tablet, TAKE 1 TABLET EVERY DAY, Disp: 90 tablet, Rfl: 1 .  rosuvastatin (CRESTOR) 20 MG tablet, TAKE 1 TABLET (20 MG TOTAL) BY MOUTH AT BEDTIME., Disp: 90 tablet, Rfl: 1  Allergies  Allergen Reactions  . Penicillins Hives    I personally reviewed active problem list, medication list, allergies, family history, social history, notes from last encounter, lab results with the patient/caregiver today.  Review of Systems   Objective:    Vitals:   09/20/18 0812  BP: 130/62  Pulse: 68  Resp: 14  Temp: (!) 96.8 F (36 C)  SpO2: 95%  Weight: 225 lb 3.2 oz (102.2 kg)  Height: '5\' 5"'$  (1.651 m)    Body mass index is 37.48 kg/m.  Physical Exam   Recent Results (from the past 2160 hour(s))  Hemoglobin A1c     Status: Abnormal   Collection Time: 08/15/18  8:42 AM  Result Value Ref Range   Hgb A1c MFr Bld 9.4 (H) <5.7 % of total Hgb    Comment: For someone without known diabetes, a hemoglobin A1c value of 6.5% or  greater indicates that they may have  diabetes and this should be confirmed with a follow-up  test. . For someone with known diabetes, a value <7% indicates  that their diabetes is well controlled and a value  greater than or equal to 7% indicates suboptimal  control. A1c targets should be individualized based on  duration of diabetes, age, comorbid conditions, and  other considerations. . Currently, no consensus exists regarding use of hemoglobin A1c for diagnosis of diabetes for children. .    Mean Plasma Glucose 223 (calc)   eAG (mmol/L) 12.4 (calc)  COMPLETE METABOLIC PANEL WITH GFR     Status: Abnormal   Collection Time: 08/15/18  8:42 AM  Result Value Ref Range   Glucose, Bld 200 (H) 65 - 99 mg/dL    Comment: .            Fasting reference interval . For someone without known diabetes, a glucose value >125 mg/dL indicates that they may have diabetes and this should be confirmed with a follow-up test. .    BUN 16 7 - 25 mg/dL   Creat 1.03 (H) 0.60 - 0.93 mg/dL    Comment: For patients >68 years of age, the reference limit for Creatinine is approximately 13% higher for people identified as African-American. .    GFR, Est Non African American 55 (L) > OR = 60 mL/min/1.84m   GFR, Est African American 63 > OR = 60 mL/min/1.753m  BUN/Creatinine Ratio 16 6 - 22 (calc)   Sodium 137 135 - 146 mmol/L   Potassium 4.7 3.5 - 5.3 mmol/L   Chloride 100 98 - 110 mmol/L   CO2 26 20 - 32 mmol/L   Calcium 10.1 8.6 - 10.4 mg/dL   Total Protein 6.9 6.1 -  8.1 g/dL   Albumin 4.3 3.6 - 5.1 g/dL   Globulin 2.6 1.9 - 3.7 g/dL (calc)   AG Ratio 1.7 1.0 - 2.5 (calc)   Total Bilirubin 0.4 0.2 - 1.2 mg/dL   Alkaline phosphatase (APISO) 47 37 - 153 U/L   AST 32 10 - 35 U/L   ALT 23 6 - 29 U/L  Lipid panel     Status: Abnormal   Collection Time: 08/15/18  8:42 AM  Result Value Ref Range   Cholesterol 160 <200 mg/dL   HDL 38 (L) > OR = 50 mg/dL   Triglycerides 205 (H) <150 mg/dL     Comment: . If a non-fasting specimen was collected, consider repeat triglyceride testing on a fasting specimen if clinically indicated.  Yates Decamp et al. J. of Clin. Lipidol. 2536;6:440-347. Marland Kitchen    LDL Cholesterol (Calc) 92 mg/dL (calc)    Comment: Reference range: <100 . Desirable range <100 mg/dL for primary prevention;   <70 mg/dL for patients with CHD or diabetic patients  with > or = 2 CHD risk factors. Marland Kitchen LDL-C is now calculated using the Martin-Hopkins  calculation, which is a validated novel method providing  better accuracy than the Friedewald equation in the  estimation of LDL-C.  Cresenciano Genre et al. Annamaria Helling. 4259;563(87): 2061-2068  (http://education.[facility].com/faq/FAQ164)    Total CHOL/HDL Ratio 4.2 <5.0 (calc)   Non-HDL Cholesterol (Calc) 122 <130 mg/dL (calc)    Comment: For patients with diabetes plus 1 major ASCVD risk  factor, treating to a non-HDL-C goal of <100 mg/dL  (LDL-C of <70 mg/dL) is considered a therapeutic  option.      PHQ2/9: Depression screen [facility] 2/9 09/20/2018 12/07/2017 09/05/2017 05/26/2017 02/23/2017  Decreased Interest 0 0 0 0 0  Down, Depressed, Hopeless 0 0 0 0 0  PHQ - 2 Score 0 0 0 0 0  Altered sleeping 0 0 - - -  Tired, decreased energy 0 1 - - -  Change in appetite 0 0 - - -  Feeling bad or failure about yourself  0 0 - - -  Trouble concentrating 0 0 - - -  Moving slowly or fidgety/restless 0 0 - - -  Suicidal thoughts 0 0 - - -  PHQ-9 Score 0 1 - - -  Difficult doing work/chores - Not difficult at all - - -    phq 9 is negative Reviewed today  Fall Risk: Fall Risk  09/20/2018 05/26/2017 02/23/2017 11/22/2016 07/14/2016  Falls in the past year? 0 No No No No  Number falls in past yr: 0 - - - -  Injury with Fall? 0 - - - -      Assessment & Plan:   Problem List Items Addressed This Visit      Endocrine   Diabetes mellitus type 2 with complications, uncontrolled ([place]) - Primary    Recheck for CBGs today after med changes, which  pt refused She is taking metformin and working on lifestyle changes Too soon to recheck A1C but fasting CBGs are much improved ranging 120-150 Will have her return in 2 months to recheck labs, asked her to call us or return sooner if CBG's are near or greater than 200      Relevant Medications   glimepiride (AMARYL) 2 MG tablet    Other Visit Diagnoses    Need for influenza vaccination       flu shot given today   Relevant Orders   Flu Vaccine QUAD High Dose(Fluad) (Completed)  Prior to leaving today pt did tell me that she started taking old medication amaryl 2 mg three times a day and that is what has improved her blood sugar - she denies hypoglycemic episodes. We discussed the risk with amaryl for age, I did give her a refill with CBG parameters, would like her to hold if CBG is under 150 and hold if not eating.  Would prefer her on GLP-1, but she seems resistant to any other medications right now.  We'll see how she does with current efforts and meds, and when she returns may change meds to extended release?  Return in about 2 months (around 11/20/2018), or routine f/up fasting labs, and is due for [facility] whenever she will schedule.   [name], PA-C 09/20/18 8:23 AM

## 2018-09-28 ENCOUNTER — Encounter: Payer: Self-pay | Admitting: Family Medicine

## 2018-10-02 ENCOUNTER — Other Ambulatory Visit: Payer: Self-pay | Admitting: Family Medicine

## 2018-10-02 DIAGNOSIS — E119 Type 2 diabetes mellitus without complications: Secondary | ICD-10-CM

## 2018-10-10 ENCOUNTER — Ambulatory Visit (INDEPENDENT_AMBULATORY_CARE_PROVIDER_SITE_OTHER): Payer: Medicare HMO

## 2018-10-10 VITALS — Ht 66.0 in | Wt 224.0 lb

## 2018-10-10 DIAGNOSIS — Z0000 Encounter for general adult medical examination without abnormal findings: Secondary | ICD-10-CM

## 2018-10-10 NOTE — Patient Instructions (Signed)
Tamara [name] , Thank you for taking time to come for your Medicare Wellness Visit. I appreciate your ongoing commitment to your health goals. Please review the following plan we discussed and let me know if I can assist you in the future.   Screening recommendations/referrals: Colonoscopy: done 02/08/17 Mammogram: done 03/07/18 Bone Density: done 03/07/18 Recommended yearly ophthalmology/optometry visit for glaucoma screening and checkup Recommended yearly dental visit for hygiene and checkup  Vaccinations: Influenza vaccine: done 09/20/18 Pneumococcal vaccine: done 03/26/13 Tdap vaccine: due - please contact us if you get a cut or scrape on rust or metal Shingles vaccine: Shingrix discussed. Please contact your pharmacy for coverage information.   Advanced directives: Advance directive discussed with you today. I have provided a copy for you to complete at home and have notarized. Once this is complete please bring a copy in to our office so we can scan it into your chart.  Conditions/risks identified: Recommend healthy eating and physical activity for desired weight loss  Next appointment: Please follow up in one year for your Medicare Annual Wellness visit.     Preventive Care 72 Years and Older, Female Preventive care refers to lifestyle choices and visits with your health care provider that can promote health and wellness. What does preventive care include?  A yearly physical exam. This is also called an annual well check.  Dental exams once or twice a year.  Routine eye exams. Ask your health care provider how often you should have your eyes checked.  Personal lifestyle choices, including:  Daily care of your teeth and gums.  Regular physical activity.  Eating a healthy diet.  Avoiding tobacco and drug use.  Limiting alcohol use.  Practicing safe sex.  Taking low-dose aspirin every day.  Taking vitamin and mineral supplements as recommended by your health care provider.  What happens during an annual well check? The services and screenings done by your health care provider during your annual well check will depend on your age, overall health, lifestyle risk factors, and family history of disease. Counseling  Your health care provider may ask you questions about your:  Alcohol use.  Tobacco use.  Drug use.  Emotional well-being.  Home and relationship well-being.  Sexual activity.  Eating habits.  History of falls.  Memory and ability to understand (cognition).  Work and work Statistician.  Reproductive health. Screening  You may have the following tests or measurements:  Height, weight, and BMI.  Blood pressure.  Lipid and cholesterol levels. These may be checked every 5 years, or more frequently if you are over 49 years old.  Skin check.  Lung cancer screening. You may have this screening every year starting at age 59 if you have a 30-pack-year history of smoking and currently smoke or have quit within the past 15 years.  Fecal occult blood test (FOBT) of the stool. You may have this test every year starting at age 10.  Flexible sigmoidoscopy or colonoscopy. You may have a sigmoidoscopy every 5 years or a colonoscopy every 10 years starting at age 60.  Hepatitis C blood test.  Hepatitis B blood test.  Sexually transmitted disease (STD) testing.  Diabetes screening. This is done by checking your blood sugar (glucose) after you have not eaten for a while (fasting). You may have this done every 1-3 years.  Bone density scan. This is done to screen for osteoporosis. You may have this done starting at age 96.  Mammogram. This may be done every 1-2 years. Talk  to your health care provider about how often you should have regular mammograms. Talk with your health care provider about your test results, treatment options, and if necessary, the need for more tests. Vaccines  Your health care provider may recommend certain vaccines, such  as:  Influenza vaccine. This is recommended every year.  Tetanus, diphtheria, and acellular pertussis (Tdap, Td) vaccine. You may need a Td booster every 10 years.  Zoster vaccine. You may need this after age 51.  Pneumococcal 13-valent conjugate (PCV13) vaccine. One dose is recommended after age 86.  Pneumococcal polysaccharide (PPSV23) vaccine. One dose is recommended after age 54. Talk to your health care provider about which screenings and vaccines you need and how often you need them. This information is not intended to replace advice given to you by your health care provider. Make sure you discuss any questions you have with your health care provider. Document Released: 01/31/2015 Document Revised: 09/24/2015 Document Reviewed: 11/05/2014 Elsevier Interactive Patient Education  2017 [place] Prevention in the Home Falls can cause injuries. They can happen to people of all ages. There are many things you can do to make your home safe and to help prevent falls. What can I do on the outside of my home?  Regularly fix the edges of walkways and driveways and fix any cracks.  Remove anything that might make you trip as you walk through a door, such as a raised step or threshold.  Trim any bushes or trees on the path to your home.  Use bright outdoor lighting.  Clear any walking paths of anything that might make someone trip, such as rocks or tools.  Regularly check to see if handrails are loose or broken. Make sure that both sides of any steps have handrails.  Any raised decks and porches should have guardrails on the edges.  Have any leaves, snow, or ice cleared regularly.  Use sand or salt on walking paths during winter.  Clean up any spills in your garage right away. This includes oil or grease spills. What can I do in the bathroom?  Use night lights.  Install grab bars by the toilet and in the tub and shower. Do not use towel bars as grab bars.  Use  non-skid mats or decals in the tub or shower.  If you need to sit down in the shower, use a plastic, non-slip stool.  Keep the floor dry. Clean up any water that spills on the floor as soon as it happens.  Remove soap buildup in the tub or shower regularly.  Attach bath mats securely with double-sided non-slip rug tape.  Do not have throw rugs and other things on the floor that can make you trip. What can I do in the bedroom?  Use night lights.  Make sure that you have a light by your bed that is easy to reach.  Do not use any sheets or blankets that are too big for your bed. They should not hang down onto the floor.  Have a firm chair that has side arms. You can use this for support while you get dressed.  Do not have throw rugs and other things on the floor that can make you trip. What can I do in the kitchen?  Clean up any spills right away.  Avoid walking on wet floors.  Keep items that you use a lot in easy-to-reach places.  If you need to reach something above you, use a strong step stool that  has a grab bar.  Keep electrical cords out of the way.  Do not use floor polish or wax that makes floors slippery. If you must use wax, use non-skid floor wax.  Do not have throw rugs and other things on the floor that can make you trip. What can I do with my stairs?  Do not leave any items on the stairs.  Make sure that there are handrails on both sides of the stairs and use them. Fix handrails that are broken or loose. Make sure that handrails are as long as the stairways.  Check any carpeting to make sure that it is firmly attached to the stairs. Fix any carpet that is loose or worn.  Avoid having throw rugs at the top or bottom of the stairs. If you do have throw rugs, attach them to the floor with carpet tape.  Make sure that you have a light switch at the top of the stairs and the bottom of the stairs. If you do not have them, ask someone to add them for you. What  else can I do to help prevent falls?  Wear shoes that:  Do not have high heels.  Have rubber bottoms.  Are comfortable and fit you well.  Are closed at the toe. Do not wear sandals.  If you use a stepladder:  Make sure that it is fully opened. Do not climb a closed stepladder.  Make sure that both sides of the stepladder are locked into place.  Ask someone to hold it for you, if possible.  Clearly mark and make sure that you can see:  Any grab bars or handrails.  First and last steps.  Where the edge of each step is.  Use tools that help you move around (mobility aids) if they are needed. These include:  Canes.  Walkers.  Scooters.  Crutches.  Turn on the lights when you go into a dark area. Replace any light bulbs as soon as they burn out.  Set up your furniture so you have a clear path. Avoid moving your furniture around.  If any of your floors are uneven, fix them.  If there are any pets around you, be aware of where they are.  Review your medicines with your doctor. Some medicines can make you feel dizzy. This can increase your chance of falling. Ask your doctor what other things that you can do to help prevent falls. This information is not intended to replace advice given to you by your health care provider. Make sure you discuss any questions you have with your health care provider. Document Released: 10/31/2008 Document Revised: 06/12/2015 Document Reviewed: 02/08/2014 Elsevier Interactive Patient Education  2017 [employer].

## 2018-10-10 NOTE — Progress Notes (Signed)
Subjective:   Tamara [name] is a 73 y.o. female who presents for an Initial Medicare Annual Wellness Visit.  Virtual Visit via Telephone Note  I connected with Talmadge Chad on 10/10/18 at 10:40 AM EDT by telephone and verified that I am speaking with the correct person using two identifiers.  Medicare Annual Wellness visit completed telephonically due to Covid-19 pandemic.   Location: Patient: home Provider: office   I discussed the limitations, risks, security and privacy concerns of performing an evaluation and management service by telephone and the availability of in person appointments. The patient expressed understanding and agreed to proceed.  Some vital signs may be absent or patient reported.   Clemetine Marker, LPN    Review of Systems      Cardiac Risk Factors include: advanced age (>36mn, >>68women);diabetes mellitus;hypertension;dyslipidemia;obesity (BMI >30kg/m2)     Objective:    Today's Vitals   10/10/18 1140  Weight: 224 lb (101.6 kg)  Height: '5\' 6"'$  (1.676 m)   Body mass index is 36.15 kg/m.  Advanced Directives 10/10/2018 02/08/2017  Does Patient Have a Medical Advance Directive? No No  Would patient like information on creating a medical advance directive? Yes (MAU/Ambulatory/Procedural Areas - Information given) No - Patient declined    Current Medications (verified) Outpatient Encounter Medications as of 10/10/2018  Medication Sig  . Accu-Chek Softclix Lancets lancets TEST UP TO FOUR TIMES DAILY AS DIRECTED. FOR TYPE 2 DIABETES MELLITUS.  .Marland KitchenAlcohol Swabs (B-D SINGLE USE SWABS REGULAR) PADS Use to check blood sugar twice daily.  . blood glucose meter kit and supplies KIT Dispense based on patient and insurance preference. Use up to four times daily as directed. (FOR ICD-10 E11.9).  .Marland Kitchenglimepiride (AMARYL) 2 MG tablet Take 1 tablet (2 mg total) by mouth 3 (three) times daily as needed (for blood sugar >160).  .Marland Kitchenglucose blood test strip Use as  instructed to check blood sugar twice daily  . hydrochlorothiazide (HYDRODIURIL) 25 MG tablet TAKE 1 TABLET EVERY DAY  . lisinopril (ZESTRIL) 10 MG tablet TAKE 1 TABLET (10 MG TOTAL) BY MOUTH DAILY.  . meloxicam (MOBIC) 7.5 MG tablet Take 1 tablet (7.5 mg total) by mouth daily.  . metFORMIN (GLUCOPHAGE) 1000 MG tablet TAKE 1 TABLET (1,000 MG TOTAL) BY MOUTH 2 (TWO) TIMES DAILY WITH A MEAL.  . methocarbamol (ROBAXIN) 500 MG tablet Take 1 tablet (500 mg total) by mouth every 8 (eight) hours as needed for muscle spasms.  . pantoprazole (PROTONIX) 40 MG tablet TAKE 1 TABLET EVERY DAY  . potassium chloride SA (K-DUR) 20 MEQ tablet TAKE 1 TABLET EVERY DAY  . rosuvastatin (CRESTOR) 20 MG tablet TAKE 1 TABLET (20 MG TOTAL) BY MOUTH AT BEDTIME.   No facility-administered encounter medications on file as of 10/10/2018.     Allergies (verified) Penicillins   History: Past Medical History:  Diagnosis Date  . [place]   . Gastritis 09/05/2017  . GERD (gastroesophageal reflux disease)   . Hyperlipidemia   . Hypertension   . RUQ pain 03/09/2017   Per patient, she has had RUQ pain 4-5 years that has grown worse in the last few months.   Past Surgical History:  Procedure Laterality Date  . BREAST BIOPSY Left 2008   CORE W/CLIP - NEG  . COLONOSCOPY WITH PROPOFOL N/A 02/08/2017   Procedure: COLONOSCOPY WITH PROPOFOL;  Surgeon: SLollie Sails MD;  [facility]  Service: Endoscopy;  Laterality: N/A;  . ESOPHAGOGASTRODUODENOSCOPY (  EGD) WITH PROPOFOL N/A 02/08/2017   Procedure: ESOPHAGOGASTRODUODENOSCOPY (EGD) WITH PROPOFOL;  Surgeon: Lollie Sails, MD;  Location: [facility] ENDOSCOPY;  Service: Endoscopy;  Laterality: N/A;  . TUBAL LIGATION     Family History  Problem Relation Age of Onset  . Breast cancer Neg Hx    Social History   Socioeconomic History  . Marital status: Married    Spouse name: Not on file  . Number of children: 3  . Years  of education: Not on file  . Highest education level: High school graduate  Occupational History  . Occupation: retired  Scientific laboratory technician  . Financial resource strain: Not hard at all  . Food insecurity    Worry: Never true    Inability: Never true  . Transportation needs    Medical: No    Non-medical: No  Tobacco Use  . Smoking status: Former Research scientist (life sciences)  . Smokeless tobacco: Never Used  Substance and Sexual Activity  . Alcohol use: No  . Drug use: No  . Sexual activity: Not on file  Lifestyle  . Physical activity    Days per week: 0 days    Minutes per session: 0 min  . Stress: Not at all  Relationships  . Social Herbalist on phone: Patient refused    Gets together: Patient refused    Attends religious service: Patient refused    Active member of club or organization: Patient refused    Attends meetings of clubs or organizations: Patient refused    Relationship status: Married  Other Topics Concern  . Not on file  Social History Narrative  . Not on file    Tobacco Counseling Counseling given: Not Answered   Clinical Intake:  Pre-visit preparation completed: Yes  Pain : No/denies pain     BMI - recorded: 36.15 Nutritional Status: BMI > 30  Obese Nutritional Risks: None Diabetes: Yes CBG done?: No Did pt. bring in CBG monitor from home?: No   Nutrition Risk Assessment:  Has the patient had any N/V/D within the last 2 months?  No  Does the patient have any non-healing wounds?  No  Has the patient had any unintentional weight loss or weight gain?  No   Diabetes:  Is the patient diabetic?  Yes  If diabetic, was a CBG obtained today?  No  Did the patient bring in their glucometer from home?  No  How often do you monitor your CBG's? 2-4 times per day. Today fasting = 167.   Financial Strains and Diabetes Management:  Are you having any financial strains with the device, your supplies or your medication? No .  Does the patient want to be seen by  Chronic Care Management for management of their diabetes?  No  Would the patient like to be referred to a Nutritionist or for Diabetic Management?  No   Diabetic Exams:  Diabetic Eye Exam: Completed 01/23/18.   Diabetic Foot Exam: Completed 12/07/17.   How often do you need to have someone help you when you read instructions, pamphlets, or other written materials from your doctor or pharmacy?: 1 - Never  Interpreter Needed?: No  Information entered by :: Clemetine Marker LPN   Activities of Daily Living In your present state of health, do you have any difficulty performing the following activities: 10/10/2018  Hearing? N  Comment declines hearing aids  Vision? N  Difficulty concentrating or making decisions? N  Walking or climbing stairs? N  Dressing or bathing? N  Doing errands, shopping? N  Preparing Food and eating ? N  Using the Toilet? N  In the past six months, have you accidently leaked urine? N  Do you have problems with loss of bowel control? N  Managing your Medications? N  Managing your Finances? N  Housekeeping or managing your Housekeeping? N  Some recent data might be hidden     Immunizations and Health Maintenance Immunization History  Administered Date(s) Administered  . Fluad Quad(high Dose 65+) 09/20/2018  . Influenza,inj,Quad PF,6+ Mos 10/18/2016, 09/29/2017  . Pneumococcal Polysaccharide-23 03/26/2013   There are no preventive care reminders to display for this patient.  Patient Care Team: [name], PA-C as PCP - General (Family Medicine)  Indicate any recent Medical Services you may have received from other than [facility] providers in the past year (date may be approximate).     Assessment:   This is a routine wellness examination for Tamara [name].  Hearing/Vision screen  Hearing Screening   '125Hz'$  '250Hz'$  '500Hz'$  '1000Hz'$  '2000Hz'$  '3000Hz'$  '4000Hz'$  '6000Hz'$  '8000Hz'$   Right ear:           Left ear:           Comments: Pt denies hearing difficulty  Vision Screening  Comments: [facility] screenings [facility] in [place] issues and exercise activities discussed: Current Exercise Habits: The patient does not participate in regular exercise at present, Exercise limited by: orthopedic condition(s)  Goals    . Weight (lb) < 200 lb (90.7 kg)     Pt would like to lose weight over the next year with healthy eating and physical activity      Depression Screen PHQ 2/9 Scores 10/10/2018 09/20/2018 12/07/2017 09/05/2017 05/26/2017 02/23/2017 11/22/2016  PHQ - 2 Score 0 0 0 0 0 0 0  PHQ- 9 Score - 0 1 - - - -    Fall Risk Fall Risk  10/10/2018 09/20/2018 05/26/2017 02/23/2017 11/22/2016  Falls in the past year? 0 0 No No No  Number falls in past yr: 0 0 - - -  Injury with Fall? 0 0 - - -  Follow up Falls prevention discussed - - - -   FALL RISK PREVENTION PERTAINING TO THE HOME:  Any stairs in or around the home? Yes  If so, do they handrails? No   - 2 steps outside  Home free of loose throw rugs in walkways, pet beds, electrical cords, etc? Yes  Adequate lighting in your home to reduce risk of falls? Yes   ASSISTIVE DEVICES UTILIZED TO PREVENT FALLS:  Life alert? No  Use of a cane, walker or w/c? No  Grab bars in the bathroom? No  Shower chair or bench in shower? No  Elevated toilet seat or a handicapped toilet? Yes   DME ORDERS:  DME order needed?  No   TIMED UP AND GO:  Was the test performed? No . Telephonic visit.   Education: Fall risk prevention has been discussed.  Intervention(s) required? No   Cognitive Function:     6CIT Screen 10/10/2018  What Year? 0 points  What month? 0 points  What time? 0 points  Count back from 20 0 points  Months in reverse 0 points  Repeat phrase 0 points  Total Score 0    Screening Tests Health Maintenance  Topic Date Due  . TETANUS/TDAP  12/08/2018 (Originally 10/30/1964)  . PNA vac Low Risk Adult (2 of 2 - PCV13) 04/11/2019 (Originally 03/27/2014)  . FOOT EXAM  12/08/2018   . OPHTHALMOLOGY EXAM  01/24/2019  . HEMOGLOBIN A1C  02/15/2019  . MAMMOGRAM  03/07/2020  . COLONOSCOPY  02/09/2027  . INFLUENZA VACCINE  Completed  . DEXA SCAN  Completed  . Hepatitis C Screening  Completed    Qualifies for Shingles Vaccine? Yes  . Due for Shingrix. Education has been provided regarding the importance of this vaccine. Pt has been advised to call insurance company to determine out of pocket expense. Advised may also receive vaccine at local pharmacy or Health Dept. Verbalized acceptance and understanding.  Tdap: Although this vaccine is not a covered service during a Wellness Exam, does the patient still wish to receive this vaccine today?  No .  Education has been provided regarding the importance of this vaccine. Advised may receive this vaccine at local pharmacy or Health Dept. Aware to provide a copy of the vaccination record if obtained from local pharmacy or Health Dept. Verbalized acceptance and understanding.  Flu Vaccine: Up to date  Pneumococcal Vaccine: Due for Pneumococcal vaccine. Does the patient want to receive this vaccine today?  No . Education has been provided regarding the importance of this vaccine but still declined. Advised may receive this vaccine at local pharmacy or Health Dept. Aware to provide a copy of the vaccination record if obtained from local pharmacy or Health Dept. Verbalized acceptance and understanding.  Cancer Screenings:  Colorectal Screening: Completed 02/08/17. Repeat every 10 years  Mammogram: Completed 03/07/18. Repeat every year.  Bone Density: Completed 03/07/18. Results reflect OSTEOPENIA. Repeat every 2 years.    Lung Cancer Screening: (Low Dose CT Chest recommended if Age 61-80 years, 30 pack-year currently smoking OR have quit w/in 15years.) does not qualify.     Additional Screening:  Hepatitis C Screening: does qualify; Completed 08/23/16  Vision Screening: Recommended annual ophthalmology exams for early detection of  glaucoma and other disorders of the eye. Is the patient up to date with their annual eye exam?  Yes  Who is the provider or what is the name of the office in which the pt attends annual eye exams? [facility]  Dental Screening: Recommended annual dental exams for proper oral hygiene  Community Resource Referral:  CRR required this visit?  No     Plan:    I have personally reviewed and addressed the Medicare Annual Wellness questionnaire and have noted the following in the patient's chart:  A. Medical and social history B. Use of alcohol, tobacco or illicit drugs  C. Current medications and supplements D. Functional ability and status E.  Nutritional status F.  Physical activity G. Advance directives H. List of other physicians I.  Hospitalizations, surgeries, and ER visits in previous 12 months J.  [place] such as hearing and vision if needed, cognitive and depression L. Referrals and appointments   In addition, I have reviewed and discussed with patient certain preventive protocols, quality metrics, and best practice recommendations. A written personalized care plan for preventive services as well as general preventive health recommendations were provided to patient.   Signed,  Clemetine Marker, LPN Nurse Health Advisor   Nurse Notes: pt doing well and appreciative of visit today

## 2018-11-13 ENCOUNTER — Other Ambulatory Visit: Payer: Self-pay | Admitting: Family Medicine

## 2018-11-22 ENCOUNTER — Ambulatory Visit (INDEPENDENT_AMBULATORY_CARE_PROVIDER_SITE_OTHER): Payer: Medicare HMO | Admitting: Family Medicine

## 2018-11-22 ENCOUNTER — Encounter: Payer: Self-pay | Admitting: Family Medicine

## 2018-11-22 ENCOUNTER — Other Ambulatory Visit: Payer: Self-pay

## 2018-11-22 VITALS — BP 138/76 | HR 84 | Temp 97.9°F | Resp 14 | Ht 66.0 in | Wt 225.0 lb

## 2018-11-22 DIAGNOSIS — E1165 Type 2 diabetes mellitus with hyperglycemia: Secondary | ICD-10-CM | POA: Diagnosis not present

## 2018-11-22 DIAGNOSIS — Z5181 Encounter for therapeutic drug level monitoring: Secondary | ICD-10-CM

## 2018-11-22 DIAGNOSIS — Z6836 Body mass index (BMI) 36.0-36.9, adult: Secondary | ICD-10-CM

## 2018-11-22 DIAGNOSIS — E079 Disorder of thyroid, unspecified: Secondary | ICD-10-CM

## 2018-11-22 DIAGNOSIS — R21 Rash and other nonspecific skin eruption: Secondary | ICD-10-CM

## 2018-11-22 DIAGNOSIS — D649 Anemia, unspecified: Secondary | ICD-10-CM | POA: Diagnosis not present

## 2018-11-22 DIAGNOSIS — I10 Essential (primary) hypertension: Secondary | ICD-10-CM | POA: Diagnosis not present

## 2018-11-22 DIAGNOSIS — E785 Hyperlipidemia, unspecified: Secondary | ICD-10-CM | POA: Diagnosis not present

## 2018-11-22 DIAGNOSIS — E118 Type 2 diabetes mellitus with unspecified complications: Secondary | ICD-10-CM | POA: Diagnosis not present

## 2018-11-22 DIAGNOSIS — IMO0002 Reserved for concepts with insufficient information to code with codable children: Secondary | ICD-10-CM

## 2018-11-22 MED ORDER — NYSTATIN 100000 UNIT/GM EX CREA: 1.0000 "application " | TOPICAL_CREAM | Freq: Two times a day (BID) | CUTANEOUS | 0 refills | Status: DC

## 2018-11-22 MED ORDER — TRIAMCINOLONE ACETONIDE 0.1 % EX OINT: 1.0000 "application " | TOPICAL_OINTMENT | Freq: Two times a day (BID) | CUTANEOUS | 0 refills | Status: DC

## 2018-11-22 NOTE — Progress Notes (Signed)
Name: Tamara [name]   MRN: [id number]    DOB: 1946/01/04   Date:11/22/2018       Progress Note  Chief Complaint  Patient presents with  . Follow-up  . Hypertension  . Diabetes  . Hyperlipidemia  . Rash    sometimes under belly and breast and wants refill on tricinolone cream     Subjective:   Tamara [name] is a 73 y.o. female, presents to clinic for routine follow up on the conditions listed above.  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 Pt notes good med compliance - she restarted amaryl on her own, was 3x a day, now decreased to 2x, we previously discussed how I would recommend other meds and would like to avoid amaryl due to age and risk of hypoglycemia, but she was adamant about refusing meds from other drug classes and wanting to continue amaryl.  Today she states she previously was on insulin. Pt has no SE from meds. No hypoglycemic episodes Denies: Polyuria, polydipsia, polyphagia, vision changes, or neuropathy Recent pertinent labs: Lab Results  Component Value Date   HGBA1C 9.4 (H) 08/15/2018   HGBA1C 7.1 (H) 04/11/2018   HGBA1C 6.6 (H) 12/07/2017      Component Value Date/Time   NA 137 08/15/2018 0842   K 4.7 08/15/2018 0842   CL 100 08/15/2018 0842   CO2 26 08/15/2018 0842   GLUCOSE 200 (H) 08/15/2018 0842   BUN 16 08/15/2018 0842   CREATININE 1.03 (H) 08/15/2018 0842   CALCIUM 10.1 08/15/2018 0842   PROT 6.9 08/15/2018 0842   ALBUMIN 4.4 08/23/2016 0825   AST 32 08/15/2018 0842   ALT 23 08/15/2018 0842   ALKPHOS 47 08/23/2016 0825   BILITOT 0.4 08/15/2018 0842   GFRNONAA 55 (L) 08/15/2018 0842   GFRAA 63 08/15/2018 0842   Current diet: in general, a "healthy" diet   Current exercise: none - only house work  due for DM foot exam and eye exam ACEI/ARB: Yes Statin: Yes   Hypertension:  Currently managed on lisinopril 10 mg and HCTZ 25 mg Pt reports good med compliance and denies any SE.  No lightheadedness, hypotension, syncope. Blood pressure today is well controlled. BP Readings from Last 3 Encounters:  11/22/18 138/76  09/20/18 130/62  08/15/18 130/80   Pt denies CP, SOB, exertional sx, LE edema, palpitation, Ha's, visual disturbances Dietary efforts for BP?  Low salt, overall eating less, trying to loose weight  HLD- Taking crestor, good compliance, has been working on diet Lab Results  Component Value Date   CHOL 160 08/15/2018   HDL 38 (L) 08/15/2018   LDLCALC 92 08/15/2018   TRIG 205 (H) 08/15/2018   CHOLHDL 4.2 08/15/2018  - Current Diet:  Cutting out carbs, hard boiled eggs, avoid fat/fried foods - Denies: Chest pain, shortness of breath, myalgias. - Documented aortic atherosclerosis? No - Risk factors for atherosclerosis: diabetes mellitus, hypercholesterolemia and hypertension   Hx of hypothyroid, she has low energy now and has not been able to loose any weight despite working diligently on her diet and some exercise.  Would like thyroid labs checked.  This was not in med hx or chart, or previously discussed.  Obesity:   Pt can usually cut back on her weight with diet changes, but she has been very strict  and diligent and she has seen no improvement in her weight.  She feels excessively hungry.  Sometimes only eating 2 meals a day. Wt Readings from Last 5 Encounters:  11/22/18 225 lb (102.1 kg)  10/10/18 224 lb (101.6 kg)  09/20/18 225 lb 3.2 oz (102.2 kg)  08/15/18 228 lb 2 oz (103.5 kg)  04/11/18 220 lb (99.8 kg)   BMI Readings from Last 5 Encounters:  11/22/18 36.32 kg/m  10/10/18 36.15 kg/m  09/20/18 37.48 kg/m  08/15/18 37.96 kg/m  04/11/18 36.61 kg/m   Rash Recurrent red rash to skin folds, esp under breasts - she brings in a tub of 72 year old medicine that is triamcinolone ointment - she reports it works well,  she would like refill.   Patient Active Problem List   Diagnosis Date Noted  . 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 Anemia of chronic disease 09/05/2017  . Colon polyps 09/05/2017  . Diabetes mellitus type 2 with complications, uncontrolled ([place]) 07/14/2016  . Hypertension 07/14/2016  . Hyperlipemia 07/14/2016  . Bilateral lower extremity edema 07/14/2016  . GERD (gastroesophageal reflux disease) 07/14/2016  . Screening for colorectal cancer 07/14/2016    Past Surgical History:  Procedure Laterality Date  . BREAST BIOPSY Left 2008   CORE W/CLIP - NEG  . COLONOSCOPY WITH PROPOFOL N/A 02/08/2017   Procedure: COLONOSCOPY WITH PROPOFOL;  Surgeon: Lollie Sails, MD;  Location: [facility];  Service: Endoscopy;  Laterality: N/A;  . ESOPHAGOGASTRODUODENOSCOPY (EGD) WITH PROPOFOL N/A 02/08/2017   Procedure: ESOPHAGOGASTRODUODENOSCOPY (EGD) WITH PROPOFOL;  Surgeon: Lollie Sails, MD;  Location: [facility];  Service: Endoscopy;  Laterality: N/A;  . TUBAL LIGATION      Family History  Problem Relation Age of Onset  . Breast cancer Neg Hx     Social History   Socioeconomic History  . Marital status: Married    Spouse name: Not on file  . Number of children: 3  . Years of education: Not on file  . Highest education level: High school graduate  Occupational History  . Occupation: retired  Scientific laboratory technician  . Financial resource strain: Not hard at all  . Food insecurity    Worry: Never true    Inability: Never true  . Transportation needs    Medical: No    Non-medical: No  Tobacco Use  . Smoking status: Former Research scientist (life sciences)  . Smokeless tobacco: Never Used  Substance and Sexual Activity  . Alcohol use: No  . Drug use: No  . Sexual activity: Not on file  Lifestyle  . Physical activity    Days per week: 0 days    Minutes per session: 0 min  . Stress: Not at all  Relationships  . Social Herbalist on  phone: Patient refused    Gets together: Patient refused    Attends religious service: Patient refused    Active member of club or organization: Patient refused    Attends meetings of clubs or organizations: Patient refused    Relationship status: Married  . Intimate partner violence    Fear of current or ex partner: No    Emotionally abused: No    Physically abused: No    Forced sexual activity: No  Other Topics Concern  . Not on file  Social History Narrative  . Not on file     Current Outpatient Medications:  .  glimepiride (AMARYL) 2 MG tablet,  Take 1 tablet (2 mg total) by mouth 3 (three) times daily as needed (for blood sugar >160)., Disp: 90 tablet, Rfl: 1 .  hydrochlorothiazide (HYDRODIURIL) 25 MG tablet, TAKE 1 TABLET EVERY DAY, Disp: 90 tablet, Rfl: 1 .  lisinopril (ZESTRIL) 10 MG tablet, TAKE 1 TABLET (10 MG TOTAL) BY MOUTH DAILY., Disp: 90 tablet, Rfl: 1 .  meloxicam (MOBIC) 7.5 MG tablet, Take 1 tablet (7.5 mg total) by mouth daily., Disp: 30 tablet, Rfl: 0 .  metFORMIN (GLUCOPHAGE) 1000 MG tablet, TAKE 1 TABLET (1,000 MG TOTAL) BY MOUTH 2 (TWO) TIMES DAILY WITH A MEAL., Disp: 180 tablet, Rfl: 1 .  methocarbamol (ROBAXIN) 500 MG tablet, Take 1 tablet (500 mg total) by mouth every 8 (eight) hours as needed for muscle spasms., Disp: 30 tablet, Rfl: 1 .  pantoprazole (PROTONIX) 40 MG tablet, TAKE 1 TABLET EVERY DAY, Disp: 90 tablet, Rfl: 0 .  potassium chloride SA (K-DUR) 20 MEQ tablet, TAKE 1 TABLET EVERY DAY, Disp: 90 tablet, Rfl: 1 .  rosuvastatin (CRESTOR) 20 MG tablet, TAKE 1 TABLET (20 MG TOTAL) BY MOUTH AT BEDTIME., Disp: 90 tablet, Rfl: 1 .  Accu-Chek Softclix Lancets lancets, TEST UP TO FOUR TIMES DAILY AS DIRECTED. FOR TYPE 2 DIABETES MELLITUS., Disp: 400 each, Rfl: 1 .  Alcohol Swabs (B-D SINGLE USE SWABS REGULAR) PADS, Use to check blood sugar twice daily., Disp: 200 each, Rfl: 5 .  blood glucose meter kit and supplies KIT, Dispense based on patient and insurance  preference. Use up to four times daily as directed. (FOR ICD-10 E11.9)., Disp: 1 each, Rfl: 0 .  glucose blood test strip, Use as instructed to check blood sugar twice daily, Disp: 100 each, Rfl: 12 .  nystatin cream (MYCOSTATIN), Apply 1 application topically 2 (two) times daily., Disp: 30 g, Rfl: 0 .  triamcinolone ointment (KENALOG) 0.1 %, Apply 1 application topically 2 (two) times daily., Disp: 30 g, Rfl: 0  Allergies  Allergen Reactions  . Penicillins Hives    I personally reviewed active problem list, medication list, allergies, family history, social history, health maintenance, notes from last encounter, lab results, imaging with the patient/caregiver today.  Review of Systems  Constitutional: Positive for fatigue. Negative for activity change, appetite change, chills, diaphoresis, fever and unexpected weight change.  HENT: Negative.   Eyes: Negative.   Respiratory: Negative.   Cardiovascular: Negative.   Gastrointestinal: Negative.   Endocrine: Negative.   Genitourinary: Negative.   Musculoskeletal: Negative.   Skin: Negative.   Allergic/Immunologic: Negative.   Neurological: Negative.   Hematological: Negative.   Psychiatric/Behavioral: Negative.   All other systems reviewed and are negative.    Objective:    Vitals:   11/22/18 0808  BP: 138/76  Pulse: 84  Resp: 14  Temp: 97.9 F (36.6 C)  SpO2: 97%  Weight: 225 lb (102.1 kg)  Height: '5\' 6"'$  (1.676 m)    Body mass index is 36.32 kg/m.  Physical Exam Vitals signs and nursing note reviewed.  Constitutional:      General: She is not in acute distress.    Appearance: Normal appearance. She is well-developed. She is obese. She is not ill-appearing, toxic-appearing or diaphoretic.     Interventions: Face mask in place.  HENT:     Head: Normocephalic and atraumatic.     Right Ear: External ear normal.     Left Ear: External ear normal.  Eyes:     General: Lids are normal. No scleral icterus.  Right  eye: No discharge.        Left eye: No discharge.     Conjunctiva/sclera: Conjunctivae normal.     Pupils: Pupils are equal, round, and reactive to light.  Neck:     Musculoskeletal: Normal range of motion and neck supple.     Thyroid: No thyroid mass, thyromegaly or thyroid tenderness.     Trachea: Phonation normal. No tracheal deviation.  Cardiovascular:     Rate and Rhythm: Normal rate and regular rhythm.     Pulses: Normal pulses.          Radial pulses are 2+ on the right side and 2+ on the left side.       Posterior tibial pulses are 2+ on the right side and 2+ on the left side.     Heart sounds: Normal heart sounds. No murmur. No friction rub. No gallop.   Pulmonary:     Effort: Pulmonary effort is normal. No respiratory distress.     Breath sounds: Normal breath sounds. No stridor. No wheezing, rhonchi or rales.  Chest:     Chest wall: No tenderness.  Abdominal:     General: Bowel sounds are normal. There is no distension.     Palpations: Abdomen is soft.     Tenderness: There is no abdominal tenderness. There is no guarding or rebound.  Musculoskeletal: Normal range of motion.        General: No deformity.     Right lower leg: No edema.     Left lower leg: No edema.     Comments: See foot exam in EMR  Lymphadenopathy:     Cervical: No cervical adenopathy.  Skin:    General: Skin is warm and dry.     Capillary Refill: Capillary refill takes less than 2 seconds.     Coloration: Skin is not jaundiced or pale.     Findings: No rash.  Neurological:     Mental Status: She is alert and oriented to person, place, and time.     Motor: No weakness or abnormal muscle tone.     Coordination: Coordination normal.     Gait: Gait normal.  Psychiatric:        Mood and Affect: Mood normal.        Speech: Speech normal.        Behavior: Behavior normal.      No results found for this or any previous visit (from the past 2160 hour(s)).  Diabetic Foot Exam: Diabetic Foot Exam -  Simple   Simple Foot Form Diabetic Foot exam was performed with the following findings: Yes 11/22/2018  8:40 AM  Visual Inspection Sensation Testing Pulse Check Comments      PHQ2/9: Depression screen [facility] 2/9 11/22/2018 10/10/2018 09/20/2018 12/07/2017 09/05/2017  Decreased Interest 0 0 0 0 0  Down, Depressed, Hopeless 0 0 0 0 0  PHQ - 2 Score 0 0 0 0 0  Altered sleeping 0 - 0 0 -  Tired, decreased energy 0 - 0 1 -  Change in appetite 0 - 0 0 -  Feeling bad or failure about yourself  0 - 0 0 -  Trouble concentrating 0 - 0 0 -  Moving slowly or fidgety/restless 0 - 0 0 -  Suicidal thoughts 0 - 0 0 -  PHQ-9 Score 0 - 0 1 -  Difficult doing work/chores Not difficult at all - - Not difficult at all -    phq 9 is negative Reviewed  today  Fall Risk: Fall Risk  11/22/2018 10/10/2018 09/20/2018 05/26/2017 02/23/2017  Falls in the past year? 0 0 0 No No  Number falls in past yr: 0 0 0 - -  Injury with Fall? 0 0 0 - -  Follow up - Falls prevention discussed - - -      Functional Status Survey: Is the patient deaf or have difficulty hearing?: No Does the patient have difficulty seeing, even when wearing glasses/contacts?: No Does the patient have difficulty concentrating, remembering, or making decisions?: No Does the patient have difficulty walking or climbing stairs?: No Does the patient have difficulty dressing or bathing?: No Does the patient have difficulty doing errands alone such as visiting a doctor's office or shopping?: No    Assessment & Plan:   1. Diabetes mellitus type 2 with complications, uncontrolled ([place]) Poorly controlled at last visit, CBGs reported to be improving, discussed other medications that we may need to add, including jardiance, januvia, GLP1's - handout with med information given so pt can review. Foot exam done today, Eye exam is UTD - COMPLETE METABOLIC PANEL WITH GFR - Lipid panel - POCT glycosylated hemoglobin (Hb A1C)  2. Hyperlipidemia, unspecified  hyperlipidemia type Recheck, compliant with meds, last LDL was controlled and HDL was low - COMPLETE METABOLIC PANEL WITH GFR - Lipid panel  3. Essential hypertension Well controlled, continue current meds, check electrolytes - COMPLETE METABOLIC PANEL WITH GFR  4. Encounter for medication monitoring - CBC with Differential/Platelet - COMPLETE METABOLIC PANEL WITH GFR - Lipid panel - POCT glycosylated hemoglobin (Hb A1C)  5. Rash and nonspecific skin eruption Discussed keeping skin dry, OTC antifungal creams and powders, did give topical steroid ointment with cautions on use and needs to use antifungal/antiyeast med with to avoid any worsening, needs to f/up if not improving, handout on intertrigo given which is what I suspect she is experiencing.  6. Thyroid disease Hx of, with difficulty with weight loss, recheck labs, pt also endorses fatigue - TSH - T4, free  8. Anemia, unspecified type Hx of anemia, last was attributed to being from chronic disease, recheck CBC and TSH  9. Class 2 severe obesity with serious comorbidity and body mass index (BMI) of 36.0 to 36.9 in adult, unspecified obesity type ([place]) I again offered DM and nutritional counseling referral to pt, but she refused.  - TSH - T4, free    No follow-ups on file.   [name], PA-C 11/22/18 10:40 AM

## 2018-11-22 NOTE — Patient Instructions (Addendum)
Intertrigo Intertrigo is skin irritation (inflammation) that happens in warm, moist areas of the body. The irritation can cause a rash and make skin raw and itchy. The rash is usually pink or red. It happens mostly between folds of skin or where skin rubs together, such as:  Between the toes.  In the armpits.  In the groin area.  Under the belly.  Under the breasts.  Around the butt area. This condition is not passed from person to person (is not contagious). What are the causes?  Heat, moisture, rubbing, and not enough air movement.  The condition can be made worse by: ? Sweat. ? Bacteria. ? A fungus, such as yeast. What increases the risk?  Moisture in your skin folds.  You are more likely to develop this condition if you: ? Have diabetes. ? Are overweight. ? Are not able to move around. ? Live in a warm and moist climate. ? Wear splints, braces, or other medical devices. ? Are not able to control your pee (urine) or poop (stool). What are the signs or symptoms?  A pink or red skin rash in the skin fold or near the skin fold.  Raw or scaly skin.  Itching.  A burning feeling.  Bleeding.  Leaking fluid.  A bad smell. How is this treated?  Cleaning and drying your skin.  Taking an antibiotic medicine or using an antibiotic skin cream for a bacterial infection.  Using an antifungal cream on your skin or taking pills for an infection that was caused by a fungus, such as yeast.  Using a steroid ointment to stop the itching and irritation.  Separating the skin fold with a clean cotton cloth to absorb moisture and allow air to flow into the area. Follow these instructions at home:  Keep the affected area clean and dry.  Do not scratch your skin.  Stay cool as much as you can. Use an air conditioner or a fan, if you have one.  Apply over-the-counter and prescription medicines only as told by your doctor.  If you were prescribed an antibiotic medicine,  use it as told by your doctor. Do not stop using the antibiotic even if your condition starts to get better.  Keep all follow-up visits as told by your doctor. This is important. How is this prevented?   Stay at a healthy weight.  Take care of your feet. This is very important if you have diabetes. You should: ? Wear shoes that fit well. ? Keep your feet dry. ? Wear clean cotton or wool socks.  Protect the skin in your groin and butt area as told by your doctor. To do this: ? Follow a regular cleaning routine. ? Use creams, powders, or ointments that protect your skin. ? Change protection pads often.  Do not wear tight clothes. Wear clothes that: ? Are loose. ? Take moisture away from your body. ? Are made of cotton.  Wear a bra that gives good support, if needed.  Shower and dry yourself well after being active. Use a hair dryer on a cool setting to dry between skin folds.  Keep your blood sugar under control if you have diabetes. Contact a doctor if:  Your symptoms do not get better with treatment.  Your symptoms get worse or they spread.  You notice more redness and warmth.  You have a fever. Summary  Intertrigo is skin irritation that occurs when folds of skin rub together.  This condition is caused by heat, moisture,   and rubbing.  This condition may be treated by cleaning and drying your skin and with medicines.  Apply over-the-counter and prescription medicines only as told by your doctor.  Keep all follow-up visits as told by your doctor. This is important. This information is not intended to replace advice given to you by your health care provider. Make sure you discuss any questions you have with your health care provider. Document Released: 02/06/2010 Document Revised: 10/13/2017 Document Reviewed: 10/13/2017 Elsevier Patient Education  2020 [place].   Empagliflozin oral tablets  Jardiance  What is this medicine? EMPAGLIFLOZIN (EM pa gli FLOE  zin) helps to treat type 2 diabetes. It helps to control blood sugar. This drug may also reduce the risk of heart attack or stroke if you have type 2 diabetes and risk factors for heart disease. Treatment is combined with diet and exercise. This medicine may be used for other purposes; ask your health care provider or pharmacist if you have questions. COMMON BRAND NAME(S): JARDIANCE What should I tell my health care provider before I take this medicine? They need to know if you have any of these conditions:  dehydration  diabetic ketoacidosis  diet low in salt  eating less due to illness, surgery, dieting, or any other reason  having surgery  high cholesterol  high levels of potassium in the blood  history of pancreatitis or pancreas problems  history of yeast infection of the penis or vagina  if you often drink alcohol  infections in the bladder, kidneys, or urinary tract  kidney disease  liver disease  low blood pressure  on hemodialysis  problems urinating  type 1 diabetes  uncircumcised female  an unusual or allergic reaction to empagliflozin, other medicines, foods, dyes, or preservatives  pregnant or trying to get pregnant  breast-feeding How should I use this medicine? Take this medicine by mouth with a glass of water. Follow the directions on the prescription label. Take it in the morning, with or without food. Take your dose at the same time each day. Do not take more often than directed. Do not stop taking except on your doctor's advice. Talk to your pediatrician regarding the use of this medicine in children. Special care may be needed. Overdosage: If you think you have taken too much of this medicine contact a poison control center or emergency room at once. NOTE: This medicine is only for you. Do not share this medicine with others. What if I miss a dose? If you miss a dose, take it as soon as you can. If it is almost time for your next dose, take only  that dose. Do not take double or extra doses. What may interact with this medicine? Do not take this medicine with any of the following medications:  gatifloxacin This medicine may also interact with the following medications:  alcohol  certain medicines for blood pressure, heart disease  diuretics This list may not describe all possible interactions. Give your health care provider a list of all the medicines, herbs, non-prescription drugs, or dietary supplements you use. Also tell them if you smoke, drink alcohol, or use illegal drugs. Some items may interact with your medicine. What should I watch for while using this medicine? Visit your doctor or health care professional for regular checks on your progress. This medicine can cause a serious condition in which there is too much acid in the blood. If you develop nausea, vomiting, stomach pain, unusual tiredness, or breathing problems, stop taking this medicine  and call your doctor right away. If possible, use a ketone dipstick to check for ketones in your urine. A test called the HbA1C (A1C) will be monitored. This is a simple blood test. It measures your blood sugar control over the last 2 to 3 months. You will receive this test every 3 to 6 months. Learn how to check your blood sugar. Learn the symptoms of low and high blood sugar and how to manage them. Always carry a quick-source of sugar with you in case you have symptoms of low blood sugar. Examples include hard sugar candy or glucose tablets. Make sure others know that you can choke if you eat or drink when you develop serious symptoms of low blood sugar, such as seizures or unconsciousness. They must get medical help at once. Tell your doctor or health care professional if you have high blood sugar. You might need to change the dose of your medicine. If you are sick or exercising more than usual, you might need to change the dose of your medicine. Do not skip meals. Ask your doctor or  health care professional if you should avoid alcohol. Many nonprescription cough and cold products contain sugar or alcohol. These can affect blood sugar. Wear a medical ID bracelet or chain, and carry a card that describes your disease and details of your medicine and dosage times. What side effects may I notice from receiving this medicine? Side effects that you should report to your doctor or health care professional as soon as possible:  allergic reactions like skin rash, itching or hives, swelling of the face, lips, or tongue  breathing problems  dizziness  feeling faint or lightheaded, falls  muscle weakness  nausea, vomiting, unusual stomach upset or pain  penile discharge, itching, or pain in men  signs and symptoms of a genital infection, such as fever; tenderness, redness, or swelling in the genitals or area from the genitals to the back of the rectum  signs and symptoms of low blood sugar such as feeling anxious, confusion, dizziness, increased hunger, unusually weak or tired, sweating, shakiness, cold, irritable, headache, blurred vision, fast heartbeat, loss of consciousness  signs and symptoms of a urinary tract infection, such as fever, chills, a burning feeling when urinating, blood in the urine, back pain  trouble passing urine or change in the amount of urine, including an urgent need to urinate more often, in larger amounts, or at night  unusual tiredness  vaginal discharge, itching, or odor in women Side effects that usually do not require medical attention (report to your doctor or health care professional if they continue or are bothersome):  mild increase in urination  thirsty This list may not describe all possible side effects. Call your doctor for medical advice about side effects. You may report side effects to FDA at 1-800-FDA-1088. Where should I keep my medicine? Keep out of the reach of children. Store at room temperature between 20 and 25 degrees  C (68 and 77 degrees F). Throw away any unused medicine after the expiration date. NOTE: This sheet is a summary. It may not cover all possible information. If you have questions about this medicine, talk to your doctor, pharmacist, or health care provider.  2020 Elsevier/Gold Standard (2016-09-16 10:25:34)  Sitagliptin oral tablet  (Januvia) What is this medicine? SITAGLIPTIN (sit a GLIP tin) helps to treat type 2 diabetes. It helps to control blood sugar. Treatment is combined with diet and exercise. This medicine may be used for other purposes;  ask your health care provider or pharmacist if you have questions. COMMON BRAND NAME(S): Januvia What should I tell my health care provider before I take this medicine? They need to know if you have any of these conditions:  diabetic ketoacidosis  kidney disease  pancreatitis  previous swelling of the tongue, face, or lips with difficulty breathing, difficulty swallowing, hoarseness, or tightening of the throat  type 1 diabetes  an unusual or allergic reaction to sitagliptin, other medicines, foods, dyes, or preservatives  pregnant or trying to get pregnant  breast-feeding How should I use this medicine? Take this medicine by mouth with a glass of water. Follow the directions on the prescription label. You can take it with or without food. Do not cut, crush or chew this medicine. Take your dose at the same time each day. Do not take more often than directed. Do not stop taking except on your doctor's advice. A special MedGuide will be given to you by the pharmacist with each prescription and refill. Be sure to read this information carefully each time. Talk to your pediatrician regarding the use of this medicine in children. Special care may be needed. Overdosage: If you think you have taken too much of this medicine contact a poison control center or emergency room at once. NOTE: This medicine is only for you. Do not share this medicine  with others. What if I miss a dose? If you miss a dose, take it as soon as you can. If it is almost time for your next dose, take only that dose. Do not take double or extra doses. What may interact with this medicine? Do not take this medicine with any of the following medications:  gatifloxacin This medicine may also interact with the following medications:  alcohol  digoxin  insulin  sulfonylureas like glimepiride, glipizide, glyburide This list may not describe all possible interactions. Give your health care provider a list of all the medicines, herbs, non-prescription drugs, or dietary supplements you use. Also tell them if you smoke, drink alcohol, or use illegal drugs. Some items may interact with your medicine. What should I watch for while using this medicine? Visit your doctor or health care professional for regular checks on your progress. A test called the HbA1C (A1C) will be monitored. This is a simple blood test. It measures your blood sugar control over the last 2 to 3 months. You will receive this test every 3 to 6 months. Learn how to check your blood sugar. Learn the symptoms of low and high blood sugar and how to manage them. Always carry a quick-source of sugar with you in case you have symptoms of low blood sugar. Examples include hard sugar candy or glucose tablets. Make sure others know that you can choke if you eat or drink when you develop serious symptoms of low blood sugar, such as seizures or unconsciousness. They must get medical help at once. Tell your doctor or health care professional if you have high blood sugar. You might need to change the dose of your medicine. If you are sick or exercising more than usual, you might need to change the dose of your medicine. Do not skip meals. Ask your doctor or health care professional if you should avoid alcohol. Many nonprescription cough and cold products contain sugar or alcohol. These can affect blood sugar. Wear a  medical ID bracelet or chain, and carry a card that describes your disease and details of your medicine and dosage times. What side  effects may I notice from receiving this medicine? Side effects that you should report to your doctor or health care professional as soon as possible:  allergic reactions like skin rash, itching or hives, swelling of the face, lips, or tongue  breathing problems  general ill feeling or flu-like symptoms  joint pain  loss of appetite  redness, blistering, peeling or loosening of the skin, including inside the mouth  signs and symptoms of heart failure like breathing problems, fast, irregular heartbeat, sudden weight gain; swelling of the ankles, feet, hands; unusually weak or tired  signs and symptoms of low blood sugar such as feeling anxious, confusion, dizziness, increased hunger, unusually weak or tired, sweating, shakiness, cold, irritable, headache, blurred vision, fast heartbeat, loss of consciousness  signs and symptoms of muscle injury like dark urine; trouble passing urine or change in the amount of urine; unusually weak or tired; muscle pain; back pain  unusual stomach upset or pain  vomiting Side effects that usually do not require medical attention (report to your doctor or health care professional if they continue or are bothersome):  diarrhea  headache  sore throat  stomach upset  stuffy or runny nose This list may not describe all possible side effects. Call your doctor for medical advice about side effects. You may report side effects to FDA at 1-800-FDA-1088. Where should I keep my medicine? Keep out of the reach of children. Store at room temperature between 15 and 30 degrees C (59 and 86 degrees F). Throw away any unused medicine after the expiration date. NOTE: This sheet is a summary. It may not cover all possible information. If you have questions about this medicine, talk to your doctor, pharmacist, or health care provider.   2020 Elsevier/Gold Standard (2017-08-09 16:38:43)   Dulaglutide injection  (Trulicity) What is this medicine? DULAGLUTIDE (DOO la GLOO tide) is used to improve blood sugar control in adults with type 2 diabetes. This medicine may be used with other oral diabetes medicines. This drug may also reduce the risk of heart attack or stroke if you have type 2 diabetes and risk factors for heart disease. This medicine may be used for other purposes; ask your health care provider or pharmacist if you have questions. COMMON BRAND NAME(S): TRULICITY What should I tell my health care provider before I take this medicine? They need to know if you have any of these conditions:  endocrine tumors (MEN 2) or if someone in your family had these tumors  eye disease, vision problems  history of pancreatitis  kidney disease  liver disease  stomach problems  thyroid cancer or if someone in your family had thyroid cancer  an unusual or allergic reaction to dulaglutide, other medicines, foods, dyes, or preservatives  pregnant or trying to get pregnant  breast-feeding How should I use this medicine? This medicine is for injection under the skin of your upper leg (thigh), stomach area, or upper arm. It is usually given once every week (every 7 days). You will be taught how to prepare and give this medicine. Use exactly as directed. Take your medicine at regular intervals. Do not take it more often than directed. If you use this medicine with insulin, you should inject this medicine and the insulin separately. Do not mix them together. Do not give the injections right next to each other. Change (rotate) injection sites with each injection. It is important that you put your used needles and syringes in a special sharps container. Do not put them  in a trash can. If you do not have a sharps container, call your pharmacist or healthcare provider to get one. A special MedGuide will be given to you by the  pharmacist with each prescription and refill. Be sure to read this information carefully each time. Talk to your pediatrician regarding the use of this medicine in children. Special care may be needed. Overdosage: If you think you have taken too much of this medicine contact a poison control center or emergency room at once. NOTE: This medicine is only for you. Do not share this medicine with others. What if I miss a dose? If you miss a dose, take it as soon as you can within 3 days after the missed dose. Then take your next dose at your regular weekly time. If it has been longer than 3 days after the missed dose, do not take the missed dose. Take the next dose at your regular time. Do not take double or extra doses. If you have questions about a missed dose, contact your health care provider for advice. What may interact with this medicine?  other medicines for diabetes Many medications may cause changes in blood sugar, these include:  alcohol containing beverages  antiviral medicines for HIV or AIDS  aspirin and aspirin-like drugs  certain medicines for blood pressure, heart disease, irregular heart beat  chromium  diuretics  female hormones, such as estrogens or progestins, birth control pills  fenofibrate  gemfibrozil  isoniazid  lanreotide  female hormones or anabolic steroids  MAOIs like Carbex, Eldepryl, Marplan, Nardil, and Parnate  medicines for weight loss  medicines for allergies, asthma, cold, or cough  medicines for depression, anxiety, or psychotic disturbances  niacin  nicotine  NSAIDs, medicines for pain and inflammation, like ibuprofen or naproxen  octreotide  pasireotide  pentamidine  phenytoin  probenecid  quinolone antibiotics such as ciprofloxacin, levofloxacin, ofloxacin  some herbal dietary supplements  steroid medicines such as prednisone or cortisone  sulfamethoxazole; trimethoprim  thyroid hormones Some medications can hide  the warning symptoms of low blood sugar (hypoglycemia). You may need to monitor your blood sugar more closely if you are taking one of these medications. These include:  beta-blockers, often used for high blood pressure or heart problems (examples include atenolol, metoprolol, propranolol)  clonidine  guanethidine  reserpine This list may not describe all possible interactions. Give your health care provider a list of all the medicines, herbs, non-prescription drugs, or dietary supplements you use. Also tell them if you smoke, drink alcohol, or use illegal drugs. Some items may interact with your medicine. What should I watch for while using this medicine? Visit your doctor or health care professional for regular checks on your progress. Drink plenty of fluids while taking this medicine. Check with your doctor or health care professional if you get an attack of severe diarrhea, nausea, and vomiting. The loss of too much body fluid can make it dangerous for you to take this medicine. A test called the HbA1C (A1C) will be monitored. This is a simple blood test. It measures your blood sugar control over the last 2 to 3 months. You will receive this test every 3 to 6 months. Learn how to check your blood sugar. Learn the symptoms of low and high blood sugar and how to manage them. Always carry a quick-source of sugar with you in case you have symptoms of low blood sugar. Examples include hard sugar candy or glucose tablets. Make sure others know that you can  choke if you eat or drink when you develop serious symptoms of low blood sugar, such as seizures or unconsciousness. They must get medical help at once. Tell your doctor or health care professional if you have high blood sugar. You might need to change the dose of your medicine. If you are sick or exercising more than usual, you might need to change the dose of your medicine. Do not skip meals. Ask your doctor or health care professional if you  should avoid alcohol. Many nonprescription cough and cold products contain sugar or alcohol. These can affect blood sugar. Pens should never be shared. Even if the needle is changed, sharing may result in passing of viruses like hepatitis or HIV. Wear a medical ID bracelet or chain, and carry a card that describes your disease and details of your medicine and dosage times. What side effects may I notice from receiving this medicine? Side effects that you should report to your doctor or health care professional as soon as possible:  allergic reactions like skin rash, itching or hives, swelling of the face, lips, or tongue  breathing problems  changes in vision  diarrhea that continues or is severe  lump or swelling on the neck  severe nausea  signs and symptoms of infection like fever or chills; cough; sore throat; pain or trouble passing urine  signs and symptoms of low blood sugar such as feeling anxious, confusion, dizziness, increased hunger, unusually weak or tired, sweating, shakiness, cold, irritable, headache, blurred vision, fast heartbeat, loss of consciousness  signs and symptoms of kidney injury like trouble passing urine or change in the amount of urine  trouble swallowing  unusual stomach upset or pain  vomiting Side effects that usually do not require medical attention (report to your doctor or health care professional if they continue or are bothersome):  diarrhea  loss of appetite  nausea  pain, redness, or irritation at site where injected  stomach upset This list may not describe all possible side effects. Call your doctor for medical advice about side effects. You may report side effects to FDA at 1-800-FDA-1088. Where should I keep my medicine? Keep out of the reach of children. Store unopened pens in a refrigerator between 2 and 8 degrees C (36 and 46 degrees F). Do not freeze or use if the medicine has been frozen. Protect from light and excessive  heat. Store in the carton until use. Each single-dose pen can be kept at room temperature, not to exceed 30 degrees C (86 degrees F) for a total of 14 days, if needed. Throw away any unused medicine after the expiration date on the label. NOTE: This sheet is a summary. It may not cover all possible information. If you have questions about this medicine, talk to your doctor, pharmacist, or health care provider.  2020 Elsevier/Gold Standard (2018-03-13 15:36:44)

## 2018-11-23 LAB — CBC WITH DIFFERENTIAL/PLATELET
Absolute Monocytes: 403 cells/uL (ref 200–950)
Basophils Absolute: 38 cells/uL (ref 0–200)
Basophils Relative: 0.6 %
Eosinophils Absolute: 353 cells/uL (ref 15–500)
Eosinophils Relative: 5.6 %
HCT: 36 % (ref 35.0–45.0)
Hemoglobin: 11.6 g/dL — ABNORMAL LOW (ref 11.7–15.5)
Lymphs Abs: 1575 cells/uL (ref 850–3900)
MCH: 28 pg (ref 27.0–33.0)
MCHC: 32.2 g/dL (ref 32.0–36.0)
MCV: 87 fL (ref 80.0–100.0)
MPV: 11 fL (ref 7.5–12.5)
Monocytes Relative: 6.4 %
Neutro Abs: 3931 cells/uL (ref 1500–7800)
Neutrophils Relative %: 62.4 %
Platelets: 176 10*3/uL (ref 140–400)
RBC: 4.14 10*6/uL (ref 3.80–5.10)
RDW: 13.2 % (ref 11.0–15.0)
Total Lymphocyte: 25 %
WBC: 6.3 10*3/uL (ref 3.8–10.8)

## 2018-11-23 LAB — COMPLETE METABOLIC PANEL WITH GFR
AG Ratio: 1.8 (calc) (ref 1.0–2.5)
ALT: 24 U/L (ref 6–29)
AST: 26 U/L (ref 10–35)
Albumin: 4.6 g/dL (ref 3.6–5.1)
Alkaline phosphatase (APISO): 43 U/L (ref 37–153)
BUN/Creatinine Ratio: 13 (calc) (ref 6–22)
BUN: 13 mg/dL (ref 7–25)
CO2: 27 mmol/L (ref 20–32)
Calcium: 9.5 mg/dL (ref 8.6–10.4)
Chloride: 101 mmol/L (ref 98–110)
Creat: 0.98 mg/dL — ABNORMAL HIGH (ref 0.60–0.93)
GFR, Est African American: 67 mL/min/{1.73_m2} (ref >=60)
GFR, Est Non African American: 58 mL/min/{1.73_m2} — ABNORMAL LOW (ref >=60)
Globulin: 2.5 g/dL (calc) (ref 1.9–3.7)
Glucose, Bld: 205 mg/dL — ABNORMAL HIGH (ref 65–99)
Potassium: 4.4 mmol/L (ref 3.5–5.3)
Sodium: 137 mmol/L (ref 135–146)
Total Bilirubin: 0.4 mg/dL (ref 0.2–1.2)
Total Protein: 7.1 g/dL (ref 6.1–8.1)

## 2018-11-23 LAB — LIPID PANEL
Cholesterol: 189 mg/dL (ref <200)
HDL: 48 mg/dL — ABNORMAL LOW (ref >=50)
LDL Cholesterol (Calc): 110 mg/dL (calc) — ABNORMAL HIGH
Non-HDL Cholesterol (Calc): 141 mg/dL (calc) — ABNORMAL HIGH (ref <130)
Total CHOL/HDL Ratio: 3.9 (calc) (ref <5.0)
Triglycerides: 195 mg/dL — ABNORMAL HIGH (ref <150)

## 2018-11-23 LAB — TSH: TSH: 4.62 mIU/L — ABNORMAL HIGH (ref 0.40–4.50)

## 2018-11-23 LAB — HEMOGLOBIN A1C
Hgb A1c MFr Bld: 8.2 % of total Hgb — ABNORMAL HIGH (ref <5.7)
Mean Plasma Glucose: 189 (calc)
eAG (mmol/L): 10.4 (calc)

## 2018-11-23 LAB — T4, FREE: Free T4: 1.2 ng/dL (ref 0.8–1.8)

## 2018-11-24 MED ORDER — LEVOTHYROXINE SODIUM 25 MCG PO TABS: ORAL_TABLET | ORAL | 0 refills | Status: DC

## 2018-11-24 NOTE — Addendum Note (Signed)
Addended by: [name] on: 11/24/2018 09:46 AM   Modules accepted: Orders

## 2019-01-15 ENCOUNTER — Other Ambulatory Visit: Payer: Self-pay | Admitting: Family Medicine

## 2019-02-06 ENCOUNTER — Other Ambulatory Visit: Payer: Self-pay | Admitting: Family Medicine

## 2019-02-06 DIAGNOSIS — E1165 Type 2 diabetes mellitus with hyperglycemia: Secondary | ICD-10-CM

## 2019-02-06 DIAGNOSIS — IMO0002 Reserved for concepts with insufficient information to code with codable children: Secondary | ICD-10-CM

## 2019-02-07 ENCOUNTER — Other Ambulatory Visit: Payer: Self-pay

## 2019-02-07 DIAGNOSIS — M545 Low back pain, unspecified: Secondary | ICD-10-CM

## 2019-02-07 DIAGNOSIS — E079 Disorder of thyroid, unspecified: Secondary | ICD-10-CM

## 2019-02-07 MED ORDER — LEVOTHYROXINE SODIUM 75 MCG PO TABS: 75.0000 ug | ORAL_TABLET | Freq: Every day | ORAL | 0 refills | Status: DC

## 2019-02-07 MED ORDER — BLOOD GLUCOSE MONITOR KIT: PACK | 0 refills | Status: DC

## 2019-02-07 MED ORDER — MELOXICAM 7.5 MG PO TABS: 7.5000 mg | ORAL_TABLET | Freq: Every day | ORAL | 0 refills | Status: DC

## 2019-02-08 ENCOUNTER — Other Ambulatory Visit: Payer: Self-pay

## 2019-02-08 DIAGNOSIS — I10 Essential (primary) hypertension: Secondary | ICD-10-CM

## 2019-02-08 DIAGNOSIS — R600 Localized edema: Secondary | ICD-10-CM

## 2019-02-08 MED ORDER — HYDROCHLOROTHIAZIDE 25 MG PO TABS: 25.0000 mg | ORAL_TABLET | Freq: Every day | ORAL | 1 refills | Status: DC

## 2019-02-12 ENCOUNTER — Other Ambulatory Visit: Payer: Self-pay

## 2019-02-12 DIAGNOSIS — E119 Type 2 diabetes mellitus without complications: Secondary | ICD-10-CM

## 2019-02-12 DIAGNOSIS — I10 Essential (primary) hypertension: Secondary | ICD-10-CM

## 2019-02-12 MED ORDER — LISINOPRIL 10 MG PO TABS: 10.0000 mg | ORAL_TABLET | Freq: Every day | ORAL | 1 refills | Status: DC

## 2019-02-12 MED ORDER — METFORMIN HCL 1000 MG PO TABS: 1000.0000 mg | ORAL_TABLET | Freq: Two times a day (BID) | ORAL | 1 refills | Status: DC

## 2019-02-14 ENCOUNTER — Other Ambulatory Visit: Payer: Self-pay | Admitting: Family Medicine

## 2019-02-14 DIAGNOSIS — M545 Low back pain, unspecified: Secondary | ICD-10-CM

## 2019-02-14 MED ORDER — METHOCARBAMOL 500 MG PO TABS: 500.0000 mg | ORAL_TABLET | Freq: Three times a day (TID) | ORAL | 1 refills | Status: DC | PRN

## 2019-02-14 NOTE — Telephone Encounter (Signed)
Medication Refill - Medication: methocarbamol (ROBAXIN) 500 MG tablet And  ACCU CHEK meter    Has the patient contacted their pharmacy? Yes.   (Agent: If no, request that the patient contact the pharmacy for the refill.) (Agent: If yes, when and what did the pharmacy advise?)  Preferred Pharmacy (with phone number or street name):  [place], [place] Phone:  [phone]  Fax:  [phone]       Agent: Please be advised that RX refills may take up to 3 business days. We ask that you follow-up with your pharmacy.

## 2019-03-21 ENCOUNTER — Other Ambulatory Visit: Payer: Self-pay | Admitting: Family Medicine

## 2019-03-22 ENCOUNTER — Ambulatory Visit (INDEPENDENT_AMBULATORY_CARE_PROVIDER_SITE_OTHER): Payer: Medicare HMO | Admitting: Family Medicine

## 2019-03-22 ENCOUNTER — Encounter: Payer: Self-pay | Admitting: Family Medicine

## 2019-03-22 ENCOUNTER — Other Ambulatory Visit: Payer: Self-pay

## 2019-03-22 VITALS — BP 124/78 | HR 95 | Temp 97.9°F | Resp 16 | Ht 66.0 in | Wt 228.7 lb

## 2019-03-22 DIAGNOSIS — E1165 Type 2 diabetes mellitus with hyperglycemia: Secondary | ICD-10-CM

## 2019-03-22 DIAGNOSIS — Z1231 Encounter for screening mammogram for malignant neoplasm of breast: Secondary | ICD-10-CM

## 2019-03-22 DIAGNOSIS — E785 Hyperlipidemia, unspecified: Secondary | ICD-10-CM | POA: Diagnosis not present

## 2019-03-22 DIAGNOSIS — E119 Type 2 diabetes mellitus without complications: Secondary | ICD-10-CM | POA: Diagnosis not present

## 2019-03-22 DIAGNOSIS — E079 Disorder of thyroid, unspecified: Secondary | ICD-10-CM | POA: Diagnosis not present

## 2019-03-22 DIAGNOSIS — Z5181 Encounter for therapeutic drug level monitoring: Secondary | ICD-10-CM

## 2019-03-22 DIAGNOSIS — Z794 Long term (current) use of insulin: Secondary | ICD-10-CM | POA: Diagnosis not present

## 2019-03-22 DIAGNOSIS — M5441 Lumbago with sciatica, right side: Secondary | ICD-10-CM

## 2019-03-22 MED ORDER — ROSUVASTATIN CALCIUM 20 MG PO TABS: 20.0000 mg | ORAL_TABLET | Freq: Every day | ORAL | 3 refills | Status: DC

## 2019-03-22 MED ORDER — LEVOTHYROXINE SODIUM 25 MCG PO TABS: 25.0000 ug | ORAL_TABLET | Freq: Every day | ORAL | 3 refills | Status: DC

## 2019-03-22 MED ORDER — PANTOPRAZOLE SODIUM 40 MG PO TBEC: 40.0000 mg | DELAYED_RELEASE_TABLET | Freq: Every day | ORAL | 3 refills | Status: DC

## 2019-03-22 MED ORDER — PREDNISONE 20 MG PO TABS: ORAL_TABLET | ORAL | 0 refills | Status: DC

## 2019-03-22 NOTE — Patient Instructions (Addendum)
Stop glimipiride and start 10 units of insulin daily and follow up with Dr. Lissa Merlin - let me know if you run out before you will see him  Goal for fasting blood sugars is 100-150  Take you thyroid medicine on an empty stomach first thing in the morning and at least 4 hours apart from acid blocking medicines, calcium supplements or iron supplements - ask your pharmacist    Sciatica  Sciatica is pain, weakness, tingling, or loss of feeling (numbness) along the sciatic nerve. The sciatic nerve starts in the lower back and goes down the back of each leg. Sciatica usually goes away on its own or with treatment. Sometimes, sciatica may come back (recur). What are the causes? This condition happens when the sciatic nerve is pinched or has pressure put on it. This may be the result of:  A disk in between the bones of the spine bulging out too far (herniated disk).  Changes in the spinal disks that occur with aging.  A condition that affects a muscle in the butt.  Extra bone growth near the sciatic nerve.  A break (fracture) of the area between your hip bones (pelvis).  Pregnancy.  Tumor. This is rare. What increases the risk? You are more likely to develop this condition if you:  Play sports that put pressure or stress on the spine.  Have poor strength and ease of movement (flexibility).  Have had a back injury in the past.  Have had back surgery.  Sit for long periods of time.  Do activities that involve bending or lifting over and over again.  Are very overweight (obese). What are the signs or symptoms? Symptoms can vary from mild to very bad. They may include:  Any of these problems in the lower back, leg, hip, or butt: ? Mild tingling, loss of feeling, or dull aches. ? Burning sensations. ? Sharp pains.  Loss of feeling in the back of the calf or the sole of the foot.  Leg weakness.  Very bad back pain that makes it hard to move. These symptoms may get worse when you  cough, sneeze, or laugh. They may also get worse when you sit or stand for long periods of time. How is this treated? This condition often gets better without any treatment. However, treatment may include:  Changing or cutting back on physical activity when you have pain.  Doing exercises and stretching.  Putting ice or heat on the affected area.  Medicines that help: ? To relieve pain and swelling. ? To relax your muscles.  Shots (injections) of medicines that help to relieve pain, irritation, and swelling.  Surgery. Follow these instructions at home: Medicines  Take over-the-counter and prescription medicines only as told by your doctor.  Ask your doctor if the medicine prescribed to you: ? Requires you to avoid driving or using heavy machinery. ? Can cause trouble pooping (constipation). You may need to take these steps to prevent or treat trouble pooping:  Drink enough fluids to keep your pee (urine) pale yellow.  Take over-the-counter or prescription medicines.  Eat foods that are high in fiber. These include beans, whole grains, and fresh fruits and vegetables.  Limit foods that are high in fat and sugar. These include fried or sweet foods. Managing pain      If told, put ice on the affected area. ? Put ice in a plastic bag. ? Place a towel between your skin and the bag. ? Leave the ice on for  20 minutes, 2-3 times a day.  If told, put heat on the affected area. Use the heat source that your doctor tells you to use, such as a moist heat pack or a heating pad. ? Place a towel between your skin and the heat source. ? Leave the heat on for 20-30 minutes. ? Remove the heat if your skin turns bright red. This is very important if you are unable to feel pain, heat, or cold. You may have a greater risk of getting burned. Activity   Return to your normal activities as told by your doctor. Ask your doctor what activities are safe for you.  Avoid activities that make  your symptoms worse.  Take short rests during the day. ? When you rest for a long time, do some physical activity or stretching between periods of rest. ? Avoid sitting for a long time without moving. Get up and move around at least one time each hour.  Exercise and stretch regularly, as told by your doctor.  Do not lift anything that is heavier than 10 lb (4.5 kg) while you have symptoms of sciatica. ? Avoid lifting heavy things even when you do not have symptoms. ? Avoid lifting heavy things over and over.  When you lift objects, always lift in a way that is safe for your body. To do this, you should: ? Bend your knees. ? Keep the object close to your body. ? Avoid twisting. General instructions  Stay at a healthy weight.  Wear comfortable shoes that support your feet. Avoid wearing high heels.  Avoid sleeping on a mattress that is too soft or too hard. You might have less pain if you sleep on a mattress that is firm enough to support your back.  Keep all follow-up visits as told by your doctor. This is important. Contact a doctor if:  You have pain that: ? Wakes you up when you are sleeping. ? Gets worse when you lie down. ? Is worse than the pain you have had in the past. ? Lasts longer than 4 weeks.  You lose weight without trying. Get help right away if:  You cannot control when you pee (urinate) or poop (have a bowel movement).  You have weakness in any of these areas and it gets worse: ? Lower back. ? The area between your hip bones. ? Butt. ? Legs.  You have redness or swelling of your back.  You have a burning feeling when you pee. Summary  Sciatica is pain, weakness, tingling, or loss of feeling (numbness) along the sciatic nerve.  This condition happens when the sciatic nerve is pinched or has pressure put on it.  Sciatica can cause pain, tingling, or loss of feeling (numbness) in the lower back, legs, hips, and butt.  Treatment often includes rest,  exercise, medicines, and putting ice or heat on the affected area. This information is not intended to replace advice given to you by your health care provider. Make sure you discuss any questions you have with your health care provider. Document Revised: 01/23/2018 Document Reviewed: 01/23/2018 Elsevier Patient Education  [place].    Piriformis Syndrome Rehab Ask your health care provider which exercises are safe for you. Do exercises exactly as told by your health care provider and adjust them as directed. It is normal to feel mild stretching, pulling, tightness, or discomfort as you do these exercises. Stop right away if you feel sudden pain or your pain gets worse. Do not begin  these exercises until told by your health care provider. Stretching and range-of-motion exercises These exercises warm up your muscles and joints and improve the movement and flexibility of your hip and pelvis. The exercises also help to relieve pain, numbness, and tingling. Hip rotation This is an exercise in which you lie on your back and stretch the muscles that rotate your hip (hip rotators) to stretch your buttocks. 1. Lie on your back on a firm surface. 2. Pull your left / right knee toward your same shoulder with your left / right hand until your knee is pointing toward the ceiling. Hold your left / right ankle with your other hand. 3. Keeping your knee steady, gently pull your left / right ankle toward your other shoulder until you feel a stretch in your buttocks. 4. Hold this position for __________ seconds. Repeat __________ times. Complete this exercise __________ times a day. Hip extensor This is an exercise in which you lie on your back and pull your knee to your chest. 1. Lie on your back on a firm surface. Both of your legs should be straight. 2. Pull your left / right knee to your chest. Hold your leg in this position by holding onto the back of your thigh or the front of your knee. 3. Hold  this position for __________ seconds. 4. Slowly return to the starting position. Repeat __________ times. Complete this exercise __________ times a day. Strengthening exercises These exercises build strength and endurance in your hip and thigh muscles. Endurance is the ability to use your muscles for a long time, even after they get tired. Straight leg raises, side-lying This exercise strengthens the muscles that rotate the leg at the hip and move it away from your body (hip abductors). 1. Lie on your side with your left / right leg in the top position. Lie so your head, shoulder, knee, and hip line up. Bend your bottom knee to help you balance. 2. Lift your top leg 4-6 inches (10-15 cm) while keeping your toes pointed straight ahead. 3. Hold this position for __________ seconds. 4. Slowly lower your leg to the starting position. 5. Let your muscles relax completely after each repetition. Repeat __________ times. Complete this exercise __________ times a day. Hip abduction and rotation This is sometimes called quadruped (on hands and knees) exercises. 1. Get on your hands and knees on a firm, lightly padded surface. Your hands should be directly below your shoulders, and your knees should be directly below your hips. 2. Lift your left / right knee out to the side. Keep your knee bent. Do not twist your body. 3. Hold this position for __________ seconds. 4. Slowly lower your leg. Repeat __________ times. Complete this exercise __________ times a day. Straight leg raises, face-down This exercise stretches the muscles that move your hips away from the front of the pelvis (hip extensors). 1. Lie on your abdomen on a bed or a firm surface with a pillow under your hips. 2. Squeeze your buttocks muscles and lift your left / right leg about 4-6 inches (10-15 cm) off the bed. Do not let your back arch. 3. Hold this position for __________ seconds. 4. Slowly lower your leg to the starting  position. 5. Let your muscles relax completely after each repetition. Repeat __________ times. Complete this exercise __________ times a day. This information is not intended to replace advice given to you by your health care provider. Make sure you discuss any questions you have with your health  care provider. Document Revised: 04/27/2018 Document Reviewed: 10/27/2017 Elsevier Patient Education  [place].

## 2019-03-22 NOTE — Progress Notes (Signed)
Patient ID: Tamara [name], female    DOB: 06-10-1945, 74 y.o.   MRN: [id number]  PCP: [name], PA-C  Chief Complaint  Patient presents with  . Follow-up  . Hypertension  . Hyperlipidemia  . Diabetes  . Hypothyroidism    Pt states, the '50mg'$  and '75mg'$  makes her nauseaues, so for the past 2 days has not been taking anything.  But before that was taking half of a '75mg'$  dose    Subjective:   Tamara [name] is a 74 y.o. female, presents to clinic with CC of the following:  HPI   Pt presents for routine follow-up but she also complains of 1 month of sciatica to right side, right low back pain and to buttock and half way down leg, very aggravated when she drives her car.  She denies any strain or injury needs time of onset.  She states it is located to her right low back SI joint area and radiates through her buttocks halfway down her leg aggravated with movement palpation and driving, no other alleviating factors.  She did try Mobic and meloxicam without any improvement.  She denies any saddle anesthesia, focal weakness or numbness, tingling.   DM - unstable, poorly controlled over the past year - she is taking metformin 1000 mg BID and amaryl 2 mg BID (amaryl per her request - she has been offered other meds by me but declined) in the past she was able to significantly improve her A1c with more strict diet and exercise.  She states that cbgs have been running >200 on average, amaryl used to work, but doesn't seem to work well anymore.  She denies polyuria, nocturia, polydipsia, polyphagia, she is very tired, she reports she has gained weight not lost any weight.  Today she requests referral to a diabetes specialist.    Hypertension:  Currently managed on HCTZ and lisinopril  Pt reports good med compliance and denies any SE.  No lightheadedness, hypotension, syncope. Blood pressure today is well controlled. BP Readings from Last 3 Encounters:  03/22/19 124/78  11/22/18 138/76    09/20/18 130/62    Pt denies CP, SOB, exertional sx, LE edema, palpitation, Ha's, visual disturbances  HLD - on crestor 20 mg tolerating, no myalgias, denies claudication sx, CP   Thyroid TSH was high previously -  Lab Results  Component Value Date   TSH 4.62 (H) 11/22/2018   She can not tolerate doses of levothyroxine higher than 25 mcg - we tried to give 25 mcg and titrate slowly up to 75 with last OV 3-4 months ago, but she felt nauseous with all doses higher than 25 and she did not call us and shes not taking, she very fatigued, sleepy.      Patient Active Problem List   Diagnosis Date Noted  . 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 Anemia of chronic disease 09/05/2017  . Colon polyps 09/05/2017  . Diabetes mellitus type 2 with complications, uncontrolled ([place]) 07/14/2016  . Hypertension 07/14/2016  . Hyperlipemia 07/14/2016  . Bilateral lower extremity edema 07/14/2016  . GERD (gastroesophageal reflux disease) 07/14/2016  . Screening for colorectal cancer 07/14/2016      Current Outpatient Medications:  .  glimepiride (AMARYL) 2 MG tablet, TAKE 1 TABLET THREE TIMES DAILY WITH MEALS AS NEEDED FOR BLOOD SUGAR  GREATER  THAN  150, Disp: 90 tablet, Rfl: 1 .  hydrochlorothiazide (HYDRODIURIL) 25 MG  tablet, Take 1 tablet (25 mg total) by mouth daily., Disp: 90 tablet, Rfl: 1 .  levothyroxine (SYNTHROID) 25 MCG tablet, Take 25 mcg POQAM x 1 week, then take 50 mcg POQAM x 1 week, then take 75 mcg POQAM, Disp: 60 tablet, Rfl: 0 .  levothyroxine (SYNTHROID) 75 MCG tablet, Take 1 tablet (75 mcg total) by mouth daily., Disp: 90 tablet, Rfl: 0 .  lisinopril (ZESTRIL) 10 MG tablet, Take 1 tablet (10 mg total) by mouth daily., Disp: 90 tablet, Rfl: 1 .  meloxicam (MOBIC) 7.5 MG tablet, Take 1 tablet (7.5 mg total) by mouth daily., Disp: 90 tablet, Rfl: 0 .  metFORMIN (GLUCOPHAGE) 1000 MG tablet, Take 1 tablet (1,000 mg total) by  mouth 2 (two) times daily with a meal., Disp: 180 tablet, Rfl: 1 .  methocarbamol (ROBAXIN) 500 MG tablet, Take 1 tablet (500 mg total) by mouth every 8 (eight) hours as needed for muscle spasms., Disp: 30 tablet, Rfl: 1 .  nystatin cream (MYCOSTATIN), Apply 1 application topically 2 (two) times daily., Disp: 30 g, Rfl: 0 .  pantoprazole (PROTONIX) 40 MG tablet, TAKE 1 TABLET EVERY DAY, Disp: 90 tablet, Rfl: 0 .  potassium chloride SA (KLOR-CON) 20 MEQ tablet, TAKE 1 TABLET EVERY DAY, Disp: 90 tablet, Rfl: 1 .  rosuvastatin (CRESTOR) 20 MG tablet, TAKE 1 TABLET (20 MG TOTAL) BY MOUTH AT BEDTIME., Disp: 90 tablet, Rfl: 1 .  triamcinolone ointment (KENALOG) 0.1 %, Apply 1 application topically 2 (two) times daily., Disp: 30 g, Rfl: 0 .  Accu-Chek Softclix Lancets lancets, TEST UP TO FOUR TIMES DAILY AS DIRECTED. FOR TYPE 2 DIABETES MELLITUS., Disp: 400 each, Rfl: 1 .  Alcohol Swabs (B-D SINGLE USE SWABS REGULAR) PADS, Use to check blood sugar twice daily., Disp: 200 each, Rfl: 5 .  blood glucose meter kit and supplies KIT, Dispense based on patient and insurance preference(accu-chek aviva). Use up to four times daily as directed. (FOR ICD-10 E11.9)., Disp: 1 each, Rfl: 0 .  glucose blood test strip, Use as instructed to check blood sugar twice daily, Disp: 100 each, Rfl: 12   Allergies  Allergen Reactions  . Penicillins Hives     Family History  Problem Relation Age of Onset  . Breast cancer Neg Hx      Social History   Socioeconomic History  . Marital status: Married    Spouse name: Not on file  . Number of children: 3  . Years of education: Not on file  . Highest education level: High school graduate  Occupational History  . Occupation: retired  Tobacco Use  . Smoking status: Former Research scientist (life sciences)  . Smokeless tobacco: Never Used  Substance and Sexual Activity  . Alcohol use: No  . Drug use: No  . Sexual activity: Not on file  Other Topics Concern  . Not on file  Social History  Narrative  . Not on file   Social Determinants of Health   Financial Resource Strain: Low Risk   . Difficulty of Paying Living Expenses: Not hard at all  Food Insecurity: No Food Insecurity  . Worried About Charity fundraiser in the Last Year: Never true  . Ran Out of Food in the Last Year: Never true  Transportation Needs: No Transportation Needs  . Lack of Transportation (Medical): No  . Lack of Transportation (Non-Medical): No  Physical Activity: Inactive  . Days of Exercise per Week: 0 days  . Minutes of Exercise per Session: 0 min  Stress:  No Stress Concern Present  . Feeling of Stress : Not at all  Social Connections: Unknown  . Frequency of Communication with Friends and Family: Patient refused  . Frequency of Social Gatherings with Friends and Family: Patient refused  . Attends Religious Services: Patient refused  . Active Member of Clubs or Organizations: Patient refused  . Attends Archivist Meetings: Patient refused  . Marital Status: Married  Human resources officer Violence: Not At Risk  . Fear of Current or Ex-Partner: No  . Emotionally Abused: No  . Physically Abused: No  . Sexually Abused: No    Chart Review Today: I personally reviewed active problem list, medication list, allergies, family history, social history, health maintenance, notes from last encounter, lab results, imaging with the patient/caregiver today.   Review of Systems 10 Systems reviewed and are negative for acute change except as noted in the HPI.     Objective:   Vitals:   03/22/19 0909  BP: 124/78  Pulse: 95  Resp: 16  Temp: 97.9 F (36.6 C)  SpO2: 97%  Weight: 228 lb 11.2 oz (103.7 kg)  Height: '5\' 6"'$  (1.676 m)    Body mass index is 36.91 kg/m.  Physical Exam Vitals and nursing note reviewed.  Constitutional:      General: She is not in acute distress.    Appearance: Normal appearance. She is well-developed. She is obese. She is not ill-appearing, toxic-appearing or  diaphoretic.     Interventions: Face mask in place.  HENT:     Head: Normocephalic and atraumatic.     Right Ear: External ear normal.     Left Ear: External ear normal.  Eyes:     General: Lids are normal. No scleral icterus.       Right eye: No discharge.        Left eye: No discharge.     Conjunctiva/sclera: Conjunctivae normal.  Neck:     Trachea: Phonation normal. No tracheal deviation.  Cardiovascular:     Rate and Rhythm: Normal rate and regular rhythm.     Pulses: Normal pulses.          Radial pulses are 2+ on the right side and 2+ on the left side.       Posterior tibial pulses are 2+ on the right side and 2+ on the left side.     Heart sounds: Normal heart sounds. No murmur. No friction rub. No gallop.   Pulmonary:     Effort: Pulmonary effort is normal. No respiratory distress.     Breath sounds: Normal breath sounds. No stridor. No wheezing, rhonchi or rales.  Chest:     Chest wall: No tenderness.  Abdominal:     General: Bowel sounds are normal. There is no distension.     Palpations: Abdomen is soft.     Tenderness: There is no abdominal tenderness. There is no guarding or rebound.  Musculoskeletal:        General: No deformity. Normal range of motion.     Cervical back: Normal range of motion and neck supple.     Right lower leg: No edema.     Left lower leg: No edema.     Comments: Slightly antalgic gait Mild tenderness to palpation to right lumbar paraspinal muscles, right SI joint area and buttocks, negative straight leg raise bilaterally, grossly normal sensation to light touch to bilateral lower extremities  Lymphadenopathy:     Cervical: No cervical adenopathy.  Skin:    General:  Skin is warm and dry.     Capillary Refill: Capillary refill takes less than 2 seconds.     Coloration: Skin is not jaundiced or pale.     Findings: No rash.  Neurological:     Mental Status: She is alert and oriented to person, place, and time.     Motor: No abnormal muscle  tone.     Gait: Gait normal.  Psychiatric:        Speech: Speech normal.        Behavior: Behavior normal.      Results for orders placed or performed in visit on 11/22/18  CBC with Differential/Platelet  Result Value Ref Range   WBC 6.3 3.8 - 10.8 Thousand/uL   RBC 4.14 3.80 - 5.10 Million/uL   Hemoglobin 11.6 (L) 11.7 - 15.5 g/dL   HCT 36.0 35.0 - 45.0 %   MCV 87.0 80.0 - 100.0 fL   MCH 28.0 27.0 - 33.0 pg   MCHC 32.2 32.0 - 36.0 g/dL   RDW 13.2 11.0 - 15.0 %   Platelets 176 140 - 400 Thousand/uL   MPV 11.0 7.5 - 12.5 fL   Neutro Abs 3,931 1,500 - 7,800 cells/uL   Lymphs Abs 1,575 850 - 3,900 cells/uL   Absolute Monocytes 403 200 - 950 cells/uL   Eosinophils Absolute 353 15 - 500 cells/uL   Basophils Absolute 38 0 - 200 cells/uL   Neutrophils Relative % 62.4 %   Total Lymphocyte 25.0 %   Monocytes Relative 6.4 %   Eosinophils Relative 5.6 %   Basophils Relative 0.6 %  COMPLETE METABOLIC PANEL WITH GFR  Result Value Ref Range   Glucose, Bld 205 (H) 65 - 99 mg/dL   BUN 13 7 - 25 mg/dL   Creat 0.98 (H) 0.60 - 0.93 mg/dL   GFR, Est Non African American 58 (L) > OR = 60 mL/min/1.26m   GFR, Est African American 67 > OR = 60 mL/min/1.746m  BUN/Creatinine Ratio 13 6 - 22 (calc)   Sodium 137 135 - 146 mmol/L   Potassium 4.4 3.5 - 5.3 mmol/L   Chloride 101 98 - 110 mmol/L   CO2 27 20 - 32 mmol/L   Calcium 9.5 8.6 - 10.4 mg/dL   Total Protein 7.1 6.1 - 8.1 g/dL   Albumin 4.6 3.6 - 5.1 g/dL   Globulin 2.5 1.9 - 3.7 g/dL (calc)   AG Ratio 1.8 1.0 - 2.5 (calc)   Total Bilirubin 0.4 0.2 - 1.2 mg/dL   Alkaline phosphatase (APISO) 43 37 - 153 U/L   AST 26 10 - 35 U/L   ALT 24 6 - 29 U/L  Lipid panel  Result Value Ref Range   Cholesterol 189 <200 mg/dL   HDL 48 (L) > OR = 50 mg/dL   Triglycerides 195 (H) <150 mg/dL   LDL Cholesterol (Calc) 110 (H) mg/dL (calc)   Total CHOL/HDL Ratio 3.9 <5.0 (calc)   Non-HDL Cholesterol (Calc) 141 (H) <130 mg/dL (calc)  Hemoglobin A1c    Result Value Ref Range   Hgb A1c MFr Bld 8.2 (H) <5.7 % of total Hgb   Mean Plasma Glucose 189 (calc)   eAG (mmol/L) 10.4 (calc)  TSH  Result Value Ref Range   TSH 4.62 (H) 0.40 - 4.50 mIU/L  T4, free  Result Value Ref Range   Free T4 1.2 0.8 - 1.8 ng/dL        Assessment & Plan:      ICD-10-CM  1. Encounter for screening mammogram for malignant neoplasm of breast  Z12.31 MM 3D SCREEN BREAST BILATERAL  2. Thyroid disease  E07.9 Ambulatory referral to Endocrinology    levothyroxine (SYNTHROID) 25 MCG tablet    TSH   recheck TSH, rx for 25 mcg only, take daily in am on empty stomach, will need 6 week recheck - discussed f/up with pt  3. Uncontrolled type 2 diabetes mellitus with hyperglycemia (HCC)  E11.65 Ambulatory referral to Endocrinology    COMPLETE METABOLIC PANEL WITH GFR    Hemoglobin A1c   poorly controlled, worsening over the past year, 10 units insulin daily in am (treseba sample given), continue metformin, stop glimepiride  4. Acute right-sided low back pain with right-sided sciatica  M54.41 predniSONE (DELTASONE) 20 MG tablet   Some mild sciatica discussed heat therapy, stretches, physical therapy, will do a short steroid burst which will make her sugars higher follow-up if not improvi  5. Type 2 diabetes mellitus without complication, without long-term current use of insulin (HCC)  E11.9 rosuvastatin (CRESTOR) 20 MG tablet   See above, endocrine referral ordered today  6. Hyperlipidemia, unspecified hyperlipidemia type  E78.5 rosuvastatin (CRESTOR) 20 MG tablet    COMPLETE METABOLIC PANEL WITH GFR    Lipid panel   Has been compliant with medications no myalgias side effects or concerns, recheck labs  7. Encounter for medication monitoring  Z51.81 CBC with Differential/Platelet    COMPLETE METABOLIC PANEL WITH GFR    Lipid panel    Hemoglobin A1c    TSH      F/up in one month if back pain not improving  Routine f/up in 4 months   DM f/up with endo  [name], PA-C 03/22/19 9:42 AM

## 2019-03-23 ENCOUNTER — Other Ambulatory Visit: Payer: Self-pay | Admitting: Family Medicine

## 2019-03-23 DIAGNOSIS — E119 Type 2 diabetes mellitus without complications: Secondary | ICD-10-CM

## 2019-03-23 LAB — LIPID PANEL
Cholesterol: 152 mg/dL (ref <200)
HDL: 42 mg/dL — ABNORMAL LOW (ref >=50)
LDL Cholesterol (Calc): 82 mg/dL (calc)
Non-HDL Cholesterol (Calc): 110 mg/dL (calc) (ref <130)
Total CHOL/HDL Ratio: 3.6 (calc) (ref <5.0)
Triglycerides: 189 mg/dL — ABNORMAL HIGH (ref <150)

## 2019-03-23 LAB — CBC WITH DIFFERENTIAL/PLATELET
Absolute Monocytes: 366 cells/uL (ref 200–950)
Basophils Absolute: 49 cells/uL (ref 0–200)
Basophils Relative: 0.8 %
Eosinophils Absolute: 329 cells/uL (ref 15–500)
Eosinophils Relative: 5.4 %
HCT: 33.9 % — ABNORMAL LOW (ref 35.0–45.0)
Hemoglobin: 11 g/dL — ABNORMAL LOW (ref 11.7–15.5)
Lymphs Abs: 1403 cells/uL (ref 850–3900)
MCH: 27.6 pg (ref 27.0–33.0)
MCHC: 32.4 g/dL (ref 32.0–36.0)
MCV: 85 fL (ref 80.0–100.0)
MPV: 11.2 fL (ref 7.5–12.5)
Monocytes Relative: 6 %
Neutro Abs: 3953 cells/uL (ref 1500–7800)
Neutrophils Relative %: 64.8 %
Platelets: 159 10*3/uL (ref 140–400)
RBC: 3.99 10*6/uL (ref 3.80–5.10)
RDW: 12.8 % (ref 11.0–15.0)
Total Lymphocyte: 23 %
WBC: 6.1 10*3/uL (ref 3.8–10.8)

## 2019-03-23 LAB — HEMOGLOBIN A1C
Hgb A1c MFr Bld: 10.8 % of total Hgb — ABNORMAL HIGH (ref <5.7)
Mean Plasma Glucose: 263 (calc)
eAG (mmol/L): 14.6 (calc)

## 2019-03-23 LAB — COMPLETE METABOLIC PANEL WITH GFR
AG Ratio: 1.7 (calc) (ref 1.0–2.5)
ALT: 22 U/L (ref 6–29)
AST: 28 U/L (ref 10–35)
Albumin: 4.2 g/dL (ref 3.6–5.1)
Alkaline phosphatase (APISO): 48 U/L (ref 37–153)
BUN: 18 mg/dL (ref 7–25)
CO2: 26 mmol/L (ref 20–32)
Calcium: 10.2 mg/dL (ref 8.6–10.4)
Chloride: 101 mmol/L (ref 98–110)
Creat: 0.91 mg/dL (ref 0.60–0.93)
GFR, Est African American: 73 mL/min/{1.73_m2} (ref >=60)
GFR, Est Non African American: 63 mL/min/{1.73_m2} (ref >=60)
Globulin: 2.5 g/dL (calc) (ref 1.9–3.7)
Glucose, Bld: 285 mg/dL — ABNORMAL HIGH (ref 65–99)
Potassium: 4.7 mmol/L (ref 3.5–5.3)
Sodium: 136 mmol/L (ref 135–146)
Total Bilirubin: 0.4 mg/dL (ref 0.2–1.2)
Total Protein: 6.7 g/dL (ref 6.1–8.1)

## 2019-03-23 LAB — TSH: TSH: 2.45 mIU/L (ref 0.40–4.50)

## 2019-04-09 ENCOUNTER — Telehealth: Payer: Self-pay

## 2019-04-09 NOTE — Telephone Encounter (Signed)
Copied from [place] [id number]. Topic: General - Other >> Apr 09, 2019 11:25 AM Tamara [name] wrote: Reason for CRM: Pt stated that she needs to speak with Kristeen Miss Tapia's nurse. Pt requests call back.

## 2019-04-09 NOTE — Telephone Encounter (Signed)
Pt called states the tresiba is not helping.  She has been on for 2 weeks and has uped to 16 units in am and is taking metformin and fasting blood sugars are still in 300's.  Please advise?

## 2019-04-10 NOTE — Telephone Encounter (Signed)
You do not have any avalability until 4/5 and she already has an appt with endo on 4/2.  So do you just want then to f/u with them?

## 2019-04-11 NOTE — Telephone Encounter (Signed)
She can increase her insulin by 4 units -20 units once daily and monitor for the next 3 days, if blood sugars are still over 200 she can increase insulin again to 24 units once daily and continue to monitor.   The long-acting insulin should not be adjusted more frequently than every 3 days  Please have her send [place] in her morning fasting blood sugars 1 week from now and I will try to call her and review them

## 2019-04-12 NOTE — Telephone Encounter (Signed)
Pt notified, will call next Thursday with readings

## 2019-04-19 ENCOUNTER — Other Ambulatory Visit: Payer: Self-pay

## 2019-04-23 ENCOUNTER — Ambulatory Visit: Payer: Medicare HMO | Admitting: Family Medicine

## 2019-04-24 ENCOUNTER — Other Ambulatory Visit: Payer: Self-pay

## 2019-04-24 ENCOUNTER — Ambulatory Visit (INDEPENDENT_AMBULATORY_CARE_PROVIDER_SITE_OTHER): Payer: Medicare HMO | Admitting: Endocrinology

## 2019-04-24 ENCOUNTER — Encounter: Payer: Self-pay | Admitting: Endocrinology

## 2019-04-24 VITALS — BP 128/74 | HR 86 | Ht 66.0 in | Wt 227.0 lb

## 2019-04-24 DIAGNOSIS — E1165 Type 2 diabetes mellitus with hyperglycemia: Secondary | ICD-10-CM | POA: Diagnosis not present

## 2019-04-24 DIAGNOSIS — E118 Type 2 diabetes mellitus with unspecified complications: Secondary | ICD-10-CM | POA: Diagnosis not present

## 2019-04-24 DIAGNOSIS — IMO0002 Reserved for concepts with insufficient information to code with codable children: Secondary | ICD-10-CM

## 2019-04-24 MED ORDER — INSULIN NPH (HUMAN) (ISOPHANE) 100 UNIT/ML ~~LOC~~ SUSP: 30.0000 [IU] | SUBCUTANEOUS | 11 refills | Status: DC

## 2019-04-24 NOTE — Progress Notes (Signed)
Subjective:    Patient ID: Tamara [name], female    DOB: 10-28-1945, 74 y.o.   MRN: [id number]  HPI pt is referred by [name], PA, for diabetes.  Pt states DM was dx'ed in 2006; she is unaware of any chronic complications; she took insulin 2017-2018 (she removed the need for insulin by losing 40 lbs); pt says her diet and exercise are not good; she has never had GDM, pancreatitis, pancreatic surgery, severe hypoglycemia or DKA.  She takes [place] 20 units qd, and metformin. She says cbg's are still in the 300's.   Past Medical History:  Diagnosis Date  . Diabetes mellitus without complication ([place])   . Gastritis 09/05/2017  . GERD (gastroesophageal reflux disease)   . Hyperlipidemia   . Hypertension   . RUQ pain 03/09/2017   Per patient, she has had RUQ pain 4-5 years that has grown worse in the last few months.    Past Surgical History:  Procedure Laterality Date  . BREAST BIOPSY Left 2008   CORE W/CLIP - NEG  . COLONOSCOPY WITH PROPOFOL N/A 02/08/2017   Procedure: COLONOSCOPY WITH PROPOFOL;  Surgeon: Lollie Sails, MD;  Location: [facility] ENDOSCOPY;  Service: Endoscopy;  Laterality: N/A;  . ESOPHAGOGASTRODUODENOSCOPY (EGD) WITH PROPOFOL N/A 02/08/2017   Procedure: ESOPHAGOGASTRODUODENOSCOPY (EGD) WITH PROPOFOL;  Surgeon: Lollie Sails, MD;  Location: [facility] ENDOSCOPY;  Service: Endoscopy;  Laterality: N/A;  . TUBAL LIGATION      Social History   Socioeconomic History  . Marital status: Married    Spouse name: Not on file  . Number of children: 3  . Years of education: Not on file  . Highest education level: High school graduate  Occupational History  . Occupation: retired  Tobacco Use  . Smoking status: Former Research scientist (life sciences)  . Smokeless tobacco: Never Used  Substance and Sexual Activity  . Alcohol use: No  . Drug use: No  . Sexual activity: Not on file  Other Topics Concern  . Not on file  Social History Narrative  . Not on file   Social Determinants of Health    Financial Resource Strain: Low Risk   . Difficulty of Paying Living Expenses: Not hard at all  Food Insecurity: No Food Insecurity  . Worried About Charity fundraiser in the Last Year: Never true  . Ran Out of Food in the Last Year: Never true  Transportation Needs: No Transportation Needs  . Lack of Transportation (Medical): No  . Lack of Transportation (Non-Medical): No  Physical Activity: Inactive  . Days of Exercise per Week: 0 days  . Minutes of Exercise per Session: 0 min  Stress: No Stress Concern Present  . Feeling of Stress : Not at all  Social Connections: Unknown  . Frequency of Communication with Friends and Family: Patient refused  . Frequency of Social Gatherings with Friends and Family: Patient refused  . Attends Religious Services: Patient refused  . Active Member of Clubs or Organizations: Patient refused  . Attends Archivist Meetings: Patient refused  . Marital Status: Married  Human resources officer Violence: Not At Risk  . Fear of Current or Ex-Partner: No  . Emotionally Abused: No  . Physically Abused: No  . Sexually Abused: No    Current Outpatient Medications on File Prior to Visit  Medication Sig Dispense Refill  . Accu-Chek Softclix Lancets lancets TEST UP TO FOUR TIMES DAILY AS DIRECTED. FOR TYPE 2 DIABETES MELLITUS. (Patient taking differently: 1 each by Other route  daily. E11.9) 400 each 1  . Alcohol Swabs (B-D SINGLE USE SWABS REGULAR) PADS Use to check blood sugar twice daily. (Patient taking differently: 1 each by Other route daily. E11.9) 200 each 5  . glucose blood test strip Use as instructed to check blood sugar twice daily (Patient taking differently: 1 each by Other route daily. E11.9) 100 each 12  . hydrochlorothiazide (HYDRODIURIL) 25 MG tablet Take 1 tablet (25 mg total) by mouth daily. 90 tablet 1  . levothyroxine (SYNTHROID) 25 MCG tablet Take 1 tablet (25 mcg total) by mouth daily before breakfast. 90 tablet 3  . lisinopril  (ZESTRIL) 10 MG tablet Take 1 tablet (10 mg total) by mouth daily. 90 tablet 1  . meloxicam (MOBIC) 7.5 MG tablet Take 1 tablet (7.5 mg total) by mouth daily. 90 tablet 0  . metFORMIN (GLUCOPHAGE) 1000 MG tablet Take 1 tablet (1,000 mg total) by mouth 2 (two) times daily with a meal. 180 tablet 1  . methocarbamol (ROBAXIN) 500 MG tablet Take 1 tablet (500 mg total) by mouth every 8 (eight) hours as needed for muscle spasms. 30 tablet 1  . nystatin cream (MYCOSTATIN) Apply 1 application topically 2 (two) times daily. 30 g 0  . pantoprazole (PROTONIX) 40 MG tablet Take 1 tablet (40 mg total) by mouth daily. 90 tablet 3  . potassium chloride SA (KLOR-CON) 20 MEQ tablet TAKE 1 TABLET EVERY DAY 90 tablet 1  . rosuvastatin (CRESTOR) 20 MG tablet Take 1 tablet (20 mg total) by mouth at bedtime. 90 tablet 3  . triamcinolone ointment (KENALOG) 0.1 % Apply 1 application topically 2 (two) times daily. 30 g 0   No current facility-administered medications on file prior to visit.    Allergies  Allergen Reactions  . Penicillins Hives    Family History  Problem Relation Age of Onset  . Diabetes Maternal Grandmother   . Breast cancer Neg Hx     BP 128/74   Pulse 86   Ht 5\' 6"  (1.676 m)   Wt 227 lb (103 kg)   SpO2 97%   BMI 36.64 kg/m    Review of Systems denies blurry vision, chest pain, sob, n/v, urinary frequency, memory loss, and depression. She has weight regain.     Objective:   Physical Exam VS: see vs page GEN: no distress HEAD: head: no deformity eyes: no periorbital swelling, no proptosis external nose and ears are normal NECK: supple, thyroid is not enlarged CHEST WALL: no deformity LUNGS: clear to auscultation CV: reg rate and rhythm, no murmur MUSCULOSKELETAL: muscle bulk and strength are grossly normal.  no obvious joint swelling.  gait is normal and steady EXTEMITIES: no deformity.  no ulcer on the feet, but the skin is dry.  feet are of normal color and temp.  no  edema PULSES: dorsalis pedis intact bilat.  no carotid bruit NEURO:  cn 2-12 grossly intact.   readily moves all 4's.  sensation is intact to touch on the feet SKIN:  Normal texture and temperature.  No rash or suspicious lesion is visible.   NODES:  None palpable at the neck PSYCH: alert, well-oriented.  Does not appear anxious nor depressed.  I have reviewed outside records, and summarized: Pt was noted to have elevated a1c, and referred here. Sciatica, dyslipidemia, and hypothyroidism we also also addressed   Lab Results  Component Value Date   TSH 2.45 03/22/2019   Lab Results  Component Value Date   HGBA1C 10.8 (H) 03/22/2019  Lab Results  Component Value Date   CREATININE 0.91 03/22/2019   BUN 18 03/22/2019   NA 136 03/22/2019   K 4.7 03/22/2019   CL 101 03/22/2019   CO2 26 03/22/2019       Assessment & Plan:  Insulin-requiring type 2 DM, new to me: severe exacerbation Obesity: She declines surgery.  SDOH: Pt says she cannot afford brand name meds.   Hypothyroidism: well-replaced.  Pt is advised to continue the same medication.  Patient Instructions  good diet and exercise significantly improve the control of your diabetes.  please let me know if you wish to be referred to a dietician.  high blood sugar is very risky to your health.  you should see an eye doctor and dentist every year.  It is very important to get all recommended vaccinations.  Controlling your blood pressure and cholesterol drastically reduces the damage diabetes does to your body.  Those who smoke should quit.  Please discuss these with your doctor.  check your blood sugar once a day.  vary the time of day when you check, between before the 3 meals, and at bedtime.  also check if you have symptoms of your blood sugar being too high or too low.  please keep a record of the readings and bring it to your next appointment here (or you can bring the meter itself).  You can write it on any piece of paper.   please call us sooner if your blood sugar goes below 70, or if you have a lot of readings over 200. Please continue the same metformin, and: Please increase the [place] to 30 units daily.  When you run out of this, start NPH insulin from [facility], 30 units each morning.  Please come back for a follow-up appointment in 2 weeks, but video if you prefer.

## 2019-04-24 NOTE — Patient Instructions (Addendum)
good diet and exercise significantly improve the control of your diabetes.  please let me know if you wish to be referred to a dietician.  high blood sugar is very risky to your health.  you should see an eye doctor and dentist every year.  It is very important to get all recommended vaccinations.  Controlling your blood pressure and cholesterol drastically reduces the damage diabetes does to your body.  Those who smoke should quit.  Please discuss these with your doctor.  check your blood sugar once a day.  vary the time of day when you check, between before the 3 meals, and at bedtime.  also check if you have symptoms of your blood sugar being too high or too low.  please keep a record of the readings and bring it to your next appointment here (or you can bring the meter itself).  You can write it on any piece of paper.  please call us sooner if your blood sugar goes below 70, or if you have a lot of readings over 200. Please continue the same metformin, and: Please increase the [place] to 30 units daily.  When you run out of this, start NPH insulin from [facility], 30 units each morning.  Please come back for a follow-up appointment in 2 weeks, but video if you prefer.

## 2019-05-08 ENCOUNTER — Other Ambulatory Visit: Payer: Self-pay

## 2019-05-09 ENCOUNTER — Encounter: Payer: Self-pay | Admitting: Endocrinology

## 2019-05-09 ENCOUNTER — Ambulatory Visit: Payer: Medicare HMO | Admitting: Endocrinology

## 2019-05-09 VITALS — BP 120/70 | HR 83 | Ht 66.0 in | Wt 228.0 lb

## 2019-05-09 DIAGNOSIS — E118 Type 2 diabetes mellitus with unspecified complications: Secondary | ICD-10-CM

## 2019-05-09 DIAGNOSIS — E1165 Type 2 diabetes mellitus with hyperglycemia: Secondary | ICD-10-CM

## 2019-05-09 DIAGNOSIS — IMO0002 Reserved for concepts with insufficient information to code with codable children: Secondary | ICD-10-CM

## 2019-05-09 LAB — POCT GLYCOSYLATED HEMOGLOBIN (HGB A1C): Hemoglobin A1C: 11.3 % — AB (ref 4.0–5.6)

## 2019-05-09 MED ORDER — INSULIN NPH (HUMAN) (ISOPHANE) 100 UNIT/ML ~~LOC~~ SUSP: 40.0000 [IU] | SUBCUTANEOUS | 11 refills | Status: DC

## 2019-05-09 NOTE — Progress Notes (Signed)
Subjective:    Patient ID: Tamara [name], female    DOB: 05-May-1945, 74 y.o.   MRN: [id number]  HPI  Pt returns for f/u of diabetes mellitus: DM type: Insulin-requiring type 2 Dx'ed: 123456 Complications: none Therapy: insulin since 2021, and metformin GDM: never DKA: never Severe hypoglycemia: never Pancreatitis: never Pancreatic imaging: never SDOH: she cannot afford brand name meds.  Other: she took insulin 2017-2018 (she removed the need for insulin by losing 40 lbs; she declines weight loss surgery Interval history: She is still using up tresiba.  no cbg record, but states cbg's vary from 189-267.  There is no trend throughout the day.  Past Medical History:  Diagnosis Date  . Diabetes mellitus without complication ([place])   . Gastritis 09/05/2017  . GERD (gastroesophageal reflux disease)   . Hyperlipidemia   . Hypertension   . RUQ pain 03/09/2017   Per patient, she has had RUQ pain 4-5 years that has grown worse in the last few months.    Past Surgical History:  Procedure Laterality Date  . BREAST BIOPSY Left 2008   CORE W/CLIP - NEG  . COLONOSCOPY WITH PROPOFOL N/A 02/08/2017   Procedure: COLONOSCOPY WITH PROPOFOL;  Surgeon: Lollie Sails, MD;  Location: [facility] ENDOSCOPY;  Service: Endoscopy;  Laterality: N/A;  . ESOPHAGOGASTRODUODENOSCOPY (EGD) WITH PROPOFOL N/A 02/08/2017   Procedure: ESOPHAGOGASTRODUODENOSCOPY (EGD) WITH PROPOFOL;  Surgeon: Lollie Sails, MD;  Location: [facility] ENDOSCOPY;  Service: Endoscopy;  Laterality: N/A;  . TUBAL LIGATION      Social History   Socioeconomic History  . Marital status: Married    Spouse name: Not on file  . Number of children: 3  . Years of education: Not on file  . Highest education level: High school graduate  Occupational History  . Occupation: retired  Tobacco Use  . Smoking status: Former Research scientist (life sciences)  . Smokeless tobacco: Never Used  Substance and Sexual Activity  . Alcohol use: No  . Drug use: No  . Sexual  activity: Not on file  Other Topics Concern  . Not on file  Social History Narrative  . Not on file   Social Determinants of Health   Financial Resource Strain: Low Risk   . Difficulty of Paying Living Expenses: Not hard at all  Food Insecurity: No Food Insecurity  . Worried About Charity fundraiser in the Last Year: Never true  . Ran Out of Food in the Last Year: Never true  Transportation Needs: No Transportation Needs  . Lack of Transportation (Medical): No  . Lack of Transportation (Non-Medical): No  Physical Activity: Inactive  . Days of Exercise per Week: 0 days  . Minutes of Exercise per Session: 0 min  Stress: No Stress Concern Present  . Feeling of Stress : Not at all  Social Connections: Unknown  . Frequency of Communication with Friends and Family: Patient refused  . Frequency of Social Gatherings with Friends and Family: Patient refused  . Attends Religious Services: Patient refused  . Active Member of Clubs or Organizations: Patient refused  . Attends Archivist Meetings: Patient refused  . Marital Status: Married  Human resources officer Violence: Not At Risk  . Fear of Current or Ex-Partner: No  . Emotionally Abused: No  . Physically Abused: No  . Sexually Abused: No    Current Outpatient Medications on File Prior to Visit  Medication Sig Dispense Refill  . Accu-Chek Softclix Lancets lancets TEST UP TO FOUR TIMES DAILY AS DIRECTED. FOR  TYPE 2 DIABETES MELLITUS. (Patient taking differently: 1 each by Other route daily. E11.9) 400 each 1  . Alcohol Swabs (B-D SINGLE USE SWABS REGULAR) PADS Use to check blood sugar twice daily. (Patient taking differently: 1 each by Other route daily. E11.9) 200 each 5  . glucose blood test strip Use as instructed to check blood sugar twice daily (Patient taking differently: 1 each by Other route daily. E11.9) 100 each 12  . hydrochlorothiazide (HYDRODIURIL) 25 MG tablet Take 1 tablet (25 mg total) by mouth daily. 90 tablet 1   . levothyroxine (SYNTHROID) 25 MCG tablet Take 1 tablet (25 mcg total) by mouth daily before breakfast. 90 tablet 3  . lisinopril (ZESTRIL) 10 MG tablet Take 1 tablet (10 mg total) by mouth daily. 90 tablet 1  . meloxicam (MOBIC) 7.5 MG tablet Take 1 tablet (7.5 mg total) by mouth daily. 90 tablet 0  . metFORMIN (GLUCOPHAGE) 1000 MG tablet Take 1 tablet (1,000 mg total) by mouth 2 (two) times daily with a meal. 180 tablet 1  . methocarbamol (ROBAXIN) 500 MG tablet Take 1 tablet (500 mg total) by mouth every 8 (eight) hours as needed for muscle spasms. 30 tablet 1  . nystatin cream (MYCOSTATIN) Apply 1 application topically 2 (two) times daily. 30 g 0  . pantoprazole (PROTONIX) 40 MG tablet Take 1 tablet (40 mg total) by mouth daily. 90 tablet 3  . potassium chloride SA (KLOR-CON) 20 MEQ tablet TAKE 1 TABLET EVERY DAY 90 tablet 1  . rosuvastatin (CRESTOR) 20 MG tablet Take 1 tablet (20 mg total) by mouth at bedtime. 90 tablet 3  . triamcinolone ointment (KENALOG) 0.1 % Apply 1 application topically 2 (two) times daily. 30 g 0   No current facility-administered medications on file prior to visit.    Allergies  Allergen Reactions  . Penicillins Hives    Family History  Problem Relation Age of Onset  . Diabetes Maternal Grandmother   . Breast cancer Neg Hx     BP 120/70   Pulse 83   Ht 5\' 6"  (1.676 m)   Wt 228 lb (103.4 kg)   SpO2 94%   BMI 36.80 kg/m   Review of Systems She denies hypoglycemia    Objective:   Physical Exam VITAL SIGNS:  See vs page GENERAL: no distress Pulses: dorsalis pedis intact bilat.   MSK: no deformity of the feet CV: trace bilat leg edema Skin:  no ulcer on the feet.  normal color and temp on the feet. Neuro: sensation is intact to touch on the feet  Lab Results  Component Value Date   HGBA1C 11.3 (A) 05/09/2019       Assessment & Plan:  Insulin-requiring type 2 DM: she needs increased rx.   Patient Instructions  check your blood sugar  once a day.  vary the time of day when you check, between before the 3 meals, and at bedtime.  also check if you have symptoms of your blood sugar being too high or too low.  please keep a record of the readings and bring it to your next appointment here (or you can bring the meter itself).  You can write it on any piece of paper.  please call us sooner if your blood sugar goes below 70, or if you have a lot of readings over 200. Please continue the same metformin, and: Please increase the [place] to 40 units daily.  When you run out of this, start NPH insulin from [facility],  40 units each morning.  Please come back for a follow-up appointment in 1 month.

## 2019-05-09 NOTE — Patient Instructions (Addendum)
check your blood sugar once a day.  vary the time of day when you check, between before the 3 meals, and at bedtime.  also check if you have symptoms of your blood sugar being too high or too low.  please keep a record of the readings and bring it to your next appointment here (or you can bring the meter itself).  You can write it on any piece of paper.  please call us sooner if your blood sugar goes below 70, or if you have a lot of readings over 200. Please continue the same metformin, and: Please increase the [place] to 40 units daily.  When you run out of this, start NPH insulin from [facility], 40 units each morning.  Please come back for a follow-up appointment in 1 month.

## 2019-05-23 ENCOUNTER — Telehealth: Payer: Self-pay | Admitting: Family Medicine

## 2019-05-23 NOTE — Chronic Care Management (AMB) (Signed)
  Chronic Care Management   Note  05/23/2019 Name: Tamara [name] MRN: [id number] DOB: 1945/06/18  [name] is a 74 y.o. year old female who is a primary care patient of [name], [place]. I reached out to Talmadge Chad by phone today in response to a referral sent by [name]'s health plan.     [name] was given information about Chronic Care Management services today including:  1. CCM service includes personalized support from designated clinical staff supervised by her physician, including individualized plan of care and coordination with other care providers 2. 24/7 contact phone numbers for assistance for urgent and routine care needs. 3. Service will only be billed when office clinical staff spend 20 minutes or more in a month to coordinate care. 4. Only one practitioner may furnish and bill the service in a calendar month. 5. The patient may stop CCM services at any time (effective at the end of the month) by phone call to the office staff. 6. The patient will be responsible for cost sharing (co-pay) of up to 20% of the service fee (after annual deductible is met).  Patient did not agree to enrollment in care management services and does not wish to consider at this time.  Follow up plan: The patient has been provided with contact information for the care management team and has been advised to call with any health related questions or concerns.   SIGNATURE

## 2019-05-28 DIAGNOSIS — R202 Paresthesia of skin: Secondary | ICD-10-CM | POA: Diagnosis not present

## 2019-06-13 ENCOUNTER — Other Ambulatory Visit: Payer: Self-pay

## 2019-06-15 ENCOUNTER — Other Ambulatory Visit: Payer: Self-pay

## 2019-06-15 ENCOUNTER — Encounter: Payer: Self-pay | Admitting: Endocrinology

## 2019-06-15 ENCOUNTER — Ambulatory Visit (INDEPENDENT_AMBULATORY_CARE_PROVIDER_SITE_OTHER): Payer: Medicare HMO | Admitting: Endocrinology

## 2019-06-15 VITALS — BP 140/70 | HR 101 | Ht 66.0 in | Wt 230.0 lb

## 2019-06-15 DIAGNOSIS — E118 Type 2 diabetes mellitus with unspecified complications: Secondary | ICD-10-CM | POA: Diagnosis not present

## 2019-06-15 DIAGNOSIS — E1165 Type 2 diabetes mellitus with hyperglycemia: Secondary | ICD-10-CM

## 2019-06-15 DIAGNOSIS — IMO0002 Reserved for concepts with insufficient information to code with codable children: Secondary | ICD-10-CM

## 2019-06-15 MED ORDER — INSULIN NPH (HUMAN) (ISOPHANE) 100 UNIT/ML ~~LOC~~ SUSP: 50.0000 [IU] | SUBCUTANEOUS | 11 refills | Status: DC

## 2019-06-15 NOTE — Patient Instructions (Addendum)
Your blood pressure is high today.  Please see your primary care provider soon, to have it rechecked check your blood sugar once a day.  vary the time of day when you check, between before the 3 meals, and at bedtime.  also check if you have symptoms of your blood sugar being too high or too low.  please keep a record of the readings and bring it to your next appointment here (or you can bring the meter itself).  You can write it on any piece of paper.  please call us sooner if your blood sugar goes below 70, or if you have a lot of readings over 200. Please continue the same metformin, and:  increase the NPH insulin to 50 units daily.   Please come back for a follow-up appointment in 1 month.

## 2019-06-15 NOTE — Progress Notes (Signed)
Subjective:    Patient ID: Tamara [name], female    DOB: [birth year], 74 y.o.   MRN: [id number]  HPI Pt returns for f/u of diabetes mellitus: DM type: Insulin-requiring type 2 Dx'ed: 123456 Complications: none Therapy: insulin since 2021, and metformin.   GDM: never DKA: never Severe hypoglycemia: never Pancreatitis: never Pancreatic imaging: never SDOH: she cannot afford brand name meds.  Other: she took insulin 2017-2018 (she removed the need for insulin by losing 40 lbs; she declines weight loss surgery Interval history: She is still using up tresiba.  no cbg record, but states cbg's vary from 151-282.  There is no trend throughout the day. Past Medical History:  Diagnosis Date  . Diabetes mellitus without complication ([place])   . Gastritis 09/05/2017  . GERD (gastroesophageal reflux disease)   . Hyperlipidemia   . Hypertension   . RUQ pain 03/09/2017   Per patient, she has had RUQ pain 4-5 years that has grown worse in the last few months.    Past Surgical History:  Procedure Laterality Date  . BREAST BIOPSY Left 2008   CORE W/CLIP - NEG  . COLONOSCOPY WITH PROPOFOL N/A 02/08/2017   Procedure: COLONOSCOPY WITH PROPOFOL;  Surgeon: Lollie Sails, MD;  Location: [facility] ENDOSCOPY;  Service: Endoscopy;  Laterality: N/A;  . ESOPHAGOGASTRODUODENOSCOPY (EGD) WITH PROPOFOL N/A 02/08/2017   Procedure: ESOPHAGOGASTRODUODENOSCOPY (EGD) WITH PROPOFOL;  Surgeon: Lollie Sails, MD;  Location: [facility] ENDOSCOPY;  Service: Endoscopy;  Laterality: N/A;  . TUBAL LIGATION      Social History   Socioeconomic History  . Marital status: Married    Spouse name: Not on file  . Number of children: 3  . Years of education: Not on file  . Highest education level: High school graduate  Occupational History  . Occupation: retired  Tobacco Use  . Smoking status: Former Research scientist (life sciences)  . Smokeless tobacco: Never Used  Substance and Sexual Activity  . Alcohol use: No  . Drug use: No  . Sexual  activity: Not on file  Other Topics Concern  . Not on file  Social History Narrative  . Not on file   Social Determinants of Health   Financial Resource Strain: Low Risk   . Difficulty of Paying Living Expenses: Not hard at all  Food Insecurity: No Food Insecurity  . Worried About Charity fundraiser in the Last Year: Never true  . Ran Out of Food in the Last Year: Never true  Transportation Needs: No Transportation Needs  . Lack of Transportation (Medical): No  . Lack of Transportation (Non-Medical): No  Physical Activity: Inactive  . Days of Exercise per Week: 0 days  . Minutes of Exercise per Session: 0 min  Stress: No Stress Concern Present  . Feeling of Stress : Not at all  Social Connections: Unknown  . Frequency of Communication with Friends and Family: Patient refused  . Frequency of Social Gatherings with Friends and Family: Patient refused  . Attends Religious Services: Patient refused  . Active Member of Clubs or Organizations: Patient refused  . Attends Archivist Meetings: Patient refused  . Marital Status: Married  Human resources officer Violence: Not At Risk  . Fear of Current or Ex-Partner: No  . Emotionally Abused: No  . Physically Abused: No  . Sexually Abused: No    Current Outpatient Medications on File Prior to Visit  Medication Sig Dispense Refill  . Accu-Chek Softclix Lancets lancets TEST UP TO FOUR TIMES DAILY AS DIRECTED. FOR  TYPE 2 DIABETES MELLITUS. (Patient taking differently: 1 each by Other route daily. E11.9) 400 each 1  . Alcohol Swabs (B-D SINGLE USE SWABS REGULAR) PADS Use to check blood sugar twice daily. (Patient taking differently: 1 each by Other route daily. E11.9) 200 each 5  . glucose blood test strip Use as instructed to check blood sugar twice daily (Patient taking differently: 1 each by Other route daily. E11.9) 100 each 12  . hydrochlorothiazide (HYDRODIURIL) 25 MG tablet Take 1 tablet (25 mg total) by mouth daily. 90 tablet 1   . levothyroxine (SYNTHROID) 25 MCG tablet Take 1 tablet (25 mcg total) by mouth daily before breakfast. 90 tablet 3  . lisinopril (ZESTRIL) 10 MG tablet Take 1 tablet (10 mg total) by mouth daily. 90 tablet 1  . meloxicam (MOBIC) 7.5 MG tablet Take 1 tablet (7.5 mg total) by mouth daily. 90 tablet 0  . metFORMIN (GLUCOPHAGE) 1000 MG tablet Take 1 tablet (1,000 mg total) by mouth 2 (two) times daily with a meal. 180 tablet 1  . methocarbamol (ROBAXIN) 500 MG tablet Take 1 tablet (500 mg total) by mouth every 8 (eight) hours as needed for muscle spasms. 30 tablet 1  . nystatin cream (MYCOSTATIN) Apply 1 application topically 2 (two) times daily. 30 g 0  . pantoprazole (PROTONIX) 40 MG tablet Take 1 tablet (40 mg total) by mouth daily. 90 tablet 3  . potassium chloride SA (KLOR-CON) 20 MEQ tablet TAKE 1 TABLET EVERY DAY 90 tablet 1  . rosuvastatin (CRESTOR) 20 MG tablet Take 1 tablet (20 mg total) by mouth at bedtime. 90 tablet 3  . triamcinolone ointment (KENALOG) 0.1 % Apply 1 application topically 2 (two) times daily. 30 g 0   No current facility-administered medications on file prior to visit.    Allergies  Allergen Reactions  . Penicillins Hives    Family History  Problem Relation Age of Onset  . Diabetes Maternal Grandmother   . Breast cancer Neg Hx     BP 140/70   Pulse (!) 101   Ht 5\' 6"  (1.676 m)   Wt 230 lb (104.3 kg)   SpO2 94%   BMI 37.12 kg/m    Review of Systems She denies hypoglycemia    Objective:   Physical Exam VITAL SIGNS:  See vs page GENERAL: no distress Pulses: dorsalis pedis intact bilat.   MSK: no deformity of the feet CV: trace bilat leg edema Skin:  no ulcer on the feet.  normal color and temp on the feet. Neuro: sensation is intact to touch on the feet       Assessment & Plan:  HTN: is noted today Insulin-requiring type 2 DM: he needs increased rx   Patient Instructions  Your blood pressure is high today.  Please see your primary care  provider soon, to have it rechecked check your blood sugar once a day.  vary the time of day when you check, between before the 3 meals, and at bedtime.  also check if you have symptoms of your blood sugar being too high or too low.  please keep a record of the readings and bring it to your next appointment here (or you can bring the meter itself).  You can write it on any piece of paper.  please call us sooner if your blood sugar goes below 70, or if you have a lot of readings over 200. Please continue the same metformin, and:  increase the NPH insulin to 50 units daily.  Please come back for a follow-up appointment in 1 month.

## 2019-06-21 ENCOUNTER — Other Ambulatory Visit: Payer: Self-pay | Admitting: Family Medicine

## 2019-06-21 DIAGNOSIS — E119 Type 2 diabetes mellitus without complications: Secondary | ICD-10-CM

## 2019-06-27 ENCOUNTER — Other Ambulatory Visit: Payer: Self-pay

## 2019-06-27 DIAGNOSIS — E119 Type 2 diabetes mellitus without complications: Secondary | ICD-10-CM

## 2019-06-27 DIAGNOSIS — I10 Essential (primary) hypertension: Secondary | ICD-10-CM

## 2019-06-27 MED ORDER — LISINOPRIL 10 MG PO TABS: 10.0000 mg | ORAL_TABLET | Freq: Every day | ORAL | 3 refills | Status: DC

## 2019-07-02 ENCOUNTER — Other Ambulatory Visit: Payer: Self-pay | Admitting: Family Medicine

## 2019-07-17 ENCOUNTER — Encounter: Payer: Self-pay | Admitting: Endocrinology

## 2019-07-17 ENCOUNTER — Other Ambulatory Visit: Payer: Self-pay

## 2019-07-17 ENCOUNTER — Ambulatory Visit (INDEPENDENT_AMBULATORY_CARE_PROVIDER_SITE_OTHER): Payer: Medicare HMO | Admitting: Endocrinology

## 2019-07-17 VITALS — BP 142/68 | HR 103 | Ht 66.0 in | Wt 228.8 lb

## 2019-07-17 DIAGNOSIS — E1165 Type 2 diabetes mellitus with hyperglycemia: Secondary | ICD-10-CM

## 2019-07-17 DIAGNOSIS — IMO0002 Reserved for concepts with insufficient information to code with codable children: Secondary | ICD-10-CM

## 2019-07-17 DIAGNOSIS — E118 Type 2 diabetes mellitus with unspecified complications: Secondary | ICD-10-CM | POA: Diagnosis not present

## 2019-07-17 LAB — POCT GLYCOSYLATED HEMOGLOBIN (HGB A1C): Hemoglobin A1C: 9 % — AB (ref 4.0–5.6)

## 2019-07-17 MED ORDER — INSULIN NPH (HUMAN) (ISOPHANE) 100 UNIT/ML ~~LOC~~ SUSP
60.0000 [IU] | SUBCUTANEOUS | 11 refills | Status: DC
Start: 2019-07-17 — End: 2019-12-10

## 2019-07-17 MED ORDER — "INSULIN SYRINGE 31G X 5/16"" 1 ML MISC": 1.0000 | Freq: Every day | 3 refills | Status: DC

## 2019-07-17 NOTE — Patient Instructions (Addendum)
Your blood pressure is high today.  Please see your primary care provider soon, to have it rechecked check your blood sugar once a day.  vary the time of day when you check, between before the 3 meals, and at bedtime.  also check if you have symptoms of your blood sugar being too high or too low.  please keep a record of the readings and bring it to your next appointment here (or you can bring the meter itself).  You can write it on any piece of paper.  please call us sooner if your blood sugar goes below 70, or if you have a lot of readings over 200. Please continue the same metformin, and:  Change the Reg insulin to the NPH, 60 units each morning.   On this type of insulin schedule, you should eat meals on a regular schedule.  If a meal is missed or significantly delayed, your blood sugar could go low.   Please come back for a follow-up appointment in 2 months.

## 2019-07-17 NOTE — Progress Notes (Signed)
Subjective:    Patient ID: Tamara [name], female    DOB: 1945-12-19, 74 y.o.   MRN: [id number]  HPI Pt returns for f/u of diabetes mellitus: DM type: Insulin-requiring type 2 Dx'ed: 9678 Complications: none Therapy: insulin since 2021, and metformin.   GDM: never DKA: never Severe hypoglycemia: never Pancreatitis: never Pancreatic imaging: never SDOH: she cannot afford brand name meds.  Other: she also took insulin 2017-2018 (she removed the need for insulin by losing 40 lbs; she declines weight loss surgery Interval history: she brings her meter with her cbg's which I have reviewed today.  cbg's vary from 76-316.  It is in general higher as the day goes on.  She takes Reg insulin, 50 units qam.  She does not take the NPH.   Past Medical History:  Diagnosis Date  . Diabetes mellitus without complication ([place])   . Gastritis 09/05/2017  . GERD (gastroesophageal reflux disease)   . Hyperlipidemia   . Hypertension   . RUQ pain 03/09/2017   Per patient, she has had RUQ pain 4-5 years that has grown worse in the last few months.    Past Surgical History:  Procedure Laterality Date  . BREAST BIOPSY Left 2008   CORE W/CLIP - NEG  . COLONOSCOPY WITH PROPOFOL N/A 02/08/2017   Procedure: COLONOSCOPY WITH PROPOFOL;  Surgeon: Lollie Sails, MD;  [facility] ENDOSCOPY;  Service: Endoscopy;  Laterality: N/A;  . ESOPHAGOGASTRODUODENOSCOPY (EGD) WITH PROPOFOL N/A 02/08/2017   Procedure: ESOPHAGOGASTRODUODENOSCOPY (EGD) WITH PROPOFOL;  Surgeon: Lollie Sails, MD;  Location[facility] ENDOSCOPY;  Service: Endoscopy;  Laterality: N/A;  . TUBAL LIGATION      Social History   Socioeconomic History  . Marital status: Married    Spouse name: Not on file  . Number of children: 3  . Years of education: Not on file  . Highest education level: High school graduate  Occupational History  . Occupation: retired  Tobacco Use  . Smoking status: Former Research scientist (life sciences)  . Smokeless tobacco: Never  Used  Vaping Use  . Vaping Use: Never assessed  Substance and Sexual Activity  . Alcohol use: No  . Drug use: No  . Sexual activity: Not on file  Other Topics Concern  . Not on file  Social History Narrative  . Not on file   Social Determinants of Health   Financial Resource Strain: Low Risk   . Difficulty of Paying Living Expenses: Not hard at all  Food Insecurity: No Food Insecurity  . Worried About Charity fundraiser in the Last Year: Never true  . Ran Out of Food in the Last Year: Never true  Transportation Needs: No Transportation Needs  . Lack of Transportation (Medical): No  . Lack of Transportation (Non-Medical): No  Physical Activity: Inactive  . Days of Exercise per Week: 0 days  . Minutes of Exercise per Session: 0 min  Stress: No Stress Concern Present  . Feeling of Stress : Not at all  Social Connections: Unknown  . Frequency of Communication with Friends and Family: Patient refused  . Frequency of Social Gatherings with Friends and Family: Patient refused  . Attends Religious Services: Patient refused  . Active Member of Clubs or Organizations: Patient refused  . Attends Archivist Meetings: Patient refused  . Marital Status: Married  Human resources officer Violence: Not At Risk  . Fear of Current or Ex-Partner: No  . Emotionally Abused: No  . Physically Abused: No  . Sexually Abused: No  Current Outpatient Medications on File Prior to Visit  Medication Sig Dispense Refill  . Accu-Chek Softclix Lancets lancets TEST UP TO FOUR TIMES DAILY AS DIRECTED. FOR TYPE 2 DIABETES MELLITUS. (Patient taking differently: 1 each by Other route daily. E11.9) 400 each 1  . Alcohol Swabs (B-D SINGLE USE SWABS REGULAR) PADS USE  TO TEST BLOOD SUGAR TWICE DAILY 200 each 5  . glucose blood test strip Use as instructed to check blood sugar twice daily (Patient taking differently: 1 each by Other route daily. E11.9) 100 each 12  . hydrochlorothiazide (HYDRODIURIL) 25 MG  tablet Take 1 tablet (25 mg total) by mouth daily. 90 tablet 1  . levothyroxine (SYNTHROID) 25 MCG tablet Take 1 tablet (25 mcg total) by mouth daily before breakfast. 90 tablet 3  . lisinopril (ZESTRIL) 10 MG tablet Take 1 tablet (10 mg total) by mouth daily. 90 tablet 3  . meloxicam (MOBIC) 7.5 MG tablet Take 1 tablet (7.5 mg total) by mouth daily. 90 tablet 0  . metFORMIN (GLUCOPHAGE) 1000 MG tablet TAKE 1 TABLET TWICE DAILY WITH MEALS 180 tablet 1  . methocarbamol (ROBAXIN) 500 MG tablet Take 1 tablet (500 mg total) by mouth every 8 (eight) hours as needed for muscle spasms. 30 tablet 1  . nystatin cream (MYCOSTATIN) Apply 1 application topically 2 (two) times daily. 30 g 0  . pantoprazole (PROTONIX) 40 MG tablet Take 1 tablet (40 mg total) by mouth daily. 90 tablet 3  . potassium chloride SA (KLOR-CON) 20 MEQ tablet TAKE 1 TABLET EVERY DAY 90 tablet 1  . rosuvastatin (CRESTOR) 20 MG tablet Take 1 tablet (20 mg total) by mouth at bedtime. 90 tablet 3  . triamcinolone ointment (KENALOG) 0.1 % Apply 1 application topically 2 (two) times daily. 30 g 0   No current facility-administered medications on file prior to visit.    Allergies  Allergen Reactions  . Penicillins Hives    Family History  Problem Relation Age of Onset  . Diabetes Maternal Grandmother   . Breast cancer Neg Hx     BP (!) 142/68   Pulse (!) 103   Ht 5\' 6"  (1.676 m)   Wt 228 lb 12.8 oz (103.8 kg)   SpO2 97%   BMI 36.93 kg/m    Review of Systems She has lost 2 lbs since last OV.      Objective:   Physical Exam VITAL SIGNS:  See vs page GENERAL: no distress Pulses: dorsalis pedis intact bilat.   MSK: no deformity of the feet CV: no leg edema Skin:  no ulcer on the feet.  normal color and temp on the feet. Neuro: sensation is intact to touch on the feet   Lab Results  Component Value Date   HGBA1C 9.0 (A) 07/17/2019   Lab Results  Component Value Date   CREATININE 0.91 03/22/2019   BUN 18  03/22/2019   NA 136 03/22/2019   K 4.7 03/22/2019   CL 101 03/22/2019   CO2 26 03/22/2019   Lab Results  Component Value Date   TSH 2.45 03/22/2019      Assessment & Plan:  HTN: is noted today Insulin-requiring type 2 DM: she needs increased rx Noncompliance with insulin.  Plan is to d/c multiple daily injections  Patient Instructions  Your blood pressure is high today.  Please see your primary care provider soon, to have it rechecked check your blood sugar once a day.  vary the time of day when you check, between  before the 3 meals, and at bedtime.  also check if you have symptoms of your blood sugar being too high or too low.  please keep a record of the readings and bring it to your next appointment here (or you can bring the meter itself).  You can write it on any piece of paper.  please call us sooner if your blood sugar goes below 70, or if you have a lot of readings over 200. Please continue the same metformin, and:  Change the Reg insulin to the NPH, 60 units each morning.   On this type of insulin schedule, you should eat meals on a regular schedule.  If a meal is missed or significantly delayed, your blood sugar could go low.   Please come back for a follow-up appointment in 2 months.

## 2019-07-24 ENCOUNTER — Ambulatory Visit (INDEPENDENT_AMBULATORY_CARE_PROVIDER_SITE_OTHER): Payer: Medicare HMO | Admitting: Family Medicine

## 2019-07-24 ENCOUNTER — Other Ambulatory Visit: Payer: Self-pay

## 2019-07-24 ENCOUNTER — Encounter: Payer: Self-pay | Admitting: Family Medicine

## 2019-07-24 VITALS — BP 126/78 | HR 74 | Temp 97.9°F | Resp 18 | Ht 66.0 in | Wt 229.8 lb

## 2019-07-24 DIAGNOSIS — IMO0002 Reserved for concepts with insufficient information to code with codable children: Secondary | ICD-10-CM

## 2019-07-24 DIAGNOSIS — E1165 Type 2 diabetes mellitus with hyperglycemia: Secondary | ICD-10-CM | POA: Diagnosis not present

## 2019-07-24 DIAGNOSIS — M542 Cervicalgia: Secondary | ICD-10-CM

## 2019-07-24 DIAGNOSIS — E118 Type 2 diabetes mellitus with unspecified complications: Secondary | ICD-10-CM | POA: Diagnosis not present

## 2019-07-24 DIAGNOSIS — M25519 Pain in unspecified shoulder: Secondary | ICD-10-CM

## 2019-07-24 DIAGNOSIS — M545 Low back pain, unspecified: Secondary | ICD-10-CM

## 2019-07-24 DIAGNOSIS — R600 Localized edema: Secondary | ICD-10-CM | POA: Diagnosis not present

## 2019-07-24 DIAGNOSIS — E661 Drug-induced obesity: Secondary | ICD-10-CM

## 2019-07-24 DIAGNOSIS — E785 Hyperlipidemia, unspecified: Secondary | ICD-10-CM | POA: Diagnosis not present

## 2019-07-24 DIAGNOSIS — Z6837 Body mass index (BMI) 37.0-37.9, adult: Secondary | ICD-10-CM

## 2019-07-24 DIAGNOSIS — I10 Essential (primary) hypertension: Secondary | ICD-10-CM | POA: Diagnosis not present

## 2019-07-24 MED ORDER — HYDROCHLOROTHIAZIDE 25 MG PO TABS: 25.0000 mg | ORAL_TABLET | Freq: Every day | ORAL | 3 refills | Status: DC

## 2019-07-24 MED ORDER — TIZANIDINE HCL 4 MG PO TABS
4.0000 mg | ORAL_TABLET | Freq: Three times a day (TID) | ORAL | 0 refills | Status: DC | PRN
Start: 2019-07-24 — End: 2019-08-06

## 2019-07-24 MED ORDER — MELOXICAM 7.5 MG PO TABS: 7.5000 mg | ORAL_TABLET | Freq: Every day | ORAL | 3 refills | Status: DC

## 2019-07-24 NOTE — Progress Notes (Signed)
Name: Tamara [name]   MRN: [id number]    DOB: 03-14-1945   Date:07/24/2019       Progress Note  Chief Complaint  Patient presents with  . Follow-up  . Hyperlipidemia  . Hypertension  . Diabetes     Subjective:   Tamara [name] is a 74 y.o. female, presents to clinic for   DM - per endo, cbgs lately have been 184, 164  DM still uncontrolled, last A1C 9.0  Foot exam done by Dr. Loanne Drilling Eye exam due  Hypertension:  Currently managed on lisinopril HCTZ  Pt reports good med compliance and denies any SE.  No lightheadedness, hypotension, syncope. Blood pressure today is well controlled. BP Readings from Last 3 Encounters:  07/24/19 126/78  07/17/19 (!) 142/68  06/15/19 140/70   Pt denies CP, SOB, exertional sx, LE edema, palpitation, Ha's, visual disturbances   Hyperlipidemia: Currently treated with crestor 20, pt reports good med compliance Last Lipids: Lab Results  Component Value Date   CHOL 152 03/22/2019   HDL 42 (L) 03/22/2019   LDLCALC 82 03/22/2019   TRIG 189 (H) 03/22/2019   CHOLHDL 3.6 03/22/2019   - Denies: Chest pain, shortness of breath, myalgias, claudication  Neck and shoulder pain onset about one month ago, right side Nothing she takes helps, OTC meds, and heating   Wt Readings from Last 5 Encounters:  07/24/19 229 lb 12.8 oz (104.2 kg)  07/17/19 228 lb 12.8 oz (103.8 kg)  06/15/19 230 lb (104.3 kg)  05/09/19 228 lb (103.4 kg)  04/24/19 227 lb (103 kg)   BMI Readings from Last 5 Encounters:  07/24/19 37.09 kg/m  07/17/19 36.93 kg/m  06/15/19 37.12 kg/m  05/09/19 36.80 kg/m  04/24/19 36.64 kg/m      Current Outpatient Medications:  .  hydrochlorothiazide (HYDRODIURIL) 25 MG tablet, Take 1 tablet (25 mg total) by mouth daily., Disp: 90 tablet, Rfl: 1 .  insulin NPH Human (NOVOLIN N) 100 UNIT/ML injection, Inject 0.6 mLs (60 Units total) into the skin every morning. And syringes 1/day, Disp: 10 mL, Rfl: 11 .  levothyroxine  (SYNTHROID) 25 MCG tablet, Take 1 tablet (25 mcg total) by mouth daily before breakfast., Disp: 90 tablet, Rfl: 3 .  lisinopril (ZESTRIL) 10 MG tablet, Take 1 tablet (10 mg total) by mouth daily., Disp: 90 tablet, Rfl: 3 .  meloxicam (MOBIC) 7.5 MG tablet, Take 1 tablet (7.5 mg total) by mouth daily., Disp: 90 tablet, Rfl: 0 .  metFORMIN (GLUCOPHAGE) 1000 MG tablet, TAKE 1 TABLET TWICE DAILY WITH MEALS, Disp: 180 tablet, Rfl: 1 .  methocarbamol (ROBAXIN) 500 MG tablet, Take 1 tablet (500 mg total) by mouth every 8 (eight) hours as needed for muscle spasms., Disp: 30 tablet, Rfl: 1 .  nystatin cream (MYCOSTATIN), Apply 1 application topically 2 (two) times daily., Disp: 30 g, Rfl: 0 .  pantoprazole (PROTONIX) 40 MG tablet, Take 1 tablet (40 mg total) by mouth daily., Disp: 90 tablet, Rfl: 3 .  potassium chloride SA (KLOR-CON) 20 MEQ tablet, TAKE 1 TABLET EVERY DAY, Disp: 90 tablet, Rfl: 1 .  rosuvastatin (CRESTOR) 20 MG tablet, Take 1 tablet (20 mg total) by mouth at bedtime., Disp: 90 tablet, Rfl: 3 .  Accu-Chek Softclix Lancets lancets, TEST UP TO FOUR TIMES DAILY AS DIRECTED. FOR TYPE 2 DIABETES MELLITUS. (Patient not taking: Reported on 07/24/2019), Disp: 400 each, Rfl: 1 .  Alcohol Swabs (B-D SINGLE USE SWABS REGULAR) PADS, USE  TO TEST BLOOD SUGAR TWICE  DAILY (Patient not taking: Reported on 07/24/2019), Disp: 200 each, Rfl: 5 .  glucose blood test strip, Use as instructed to check blood sugar twice daily (Patient not taking: Reported on 07/24/2019), Disp: 100 each, Rfl: 12 .  Insulin Syringe-Needle U-100 (INSULIN SYRINGE 1CC/31GX5/16") 31G X 5/16" 1 ML MISC, 1 Syringe by Does not apply route daily. (Patient not taking: Reported on 07/24/2019), Disp: 90 each, Rfl: 3 .  triamcinolone ointment (KENALOG) 0.1 %, Apply 1 application topically 2 (two) times daily. (Patient not taking: Reported on 07/24/2019), Disp: 30 g, Rfl: 0  Patient Active Problem List   Diagnosis Date Noted  . 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 Anemia of chronic disease 09/05/2017  . Colon polyps 09/05/2017  . Diabetes mellitus type 2 with complications, uncontrolled ([place]) 07/14/2016  . Hypertension 07/14/2016  . Hyperlipemia 07/14/2016  . Bilateral lower extremity edema 07/14/2016  . GERD (gastroesophageal reflux disease) 07/14/2016  . Screening for colorectal cancer 07/14/2016    Past Surgical History:  Procedure Laterality Date  . BREAST BIOPSY Left 2008   CORE W/CLIP - NEG  . COLONOSCOPY WITH PROPOFOL N/A 02/08/2017   Procedure: COLONOSCOPY WITH PROPOFOL;  Surgeon: Lollie Sails, MD;  Location: [facility] ENDOSCOPY;  Service: Endoscopy;  Laterality: N/A;  . ESOPHAGOGASTRODUODENOSCOPY (EGD) WITH PROPOFOL N/A 02/08/2017   Procedure: ESOPHAGOGASTRODUODENOSCOPY (EGD) WITH PROPOFOL;  Surgeon: Lollie Sails, MD;  Location[facility]COPY;  Service: Endoscopy;  Laterality: N/A;  . TUBAL LIGATION      Family History  Problem Relation Age of Onset  . Diabetes Maternal Grandmother   . Breast cancer Neg Hx     Social History   Tobacco Use  . Smoking status: Former Research scientist (life sciences)  . Smokeless tobacco: Never Used  Vaping Use  . Vaping Use: Never assessed  Substance Use Topics  . Alcohol use: No  . Drug use: No     Allergies  Allergen Reactions  . Penicillins Hives    Health Maintenance  Topic Date Due  . PNA vac Low Risk Adult (2 of 2 - PCV13) 03/27/2014  . OPHTHALMOLOGY EXAM  01/24/2019  . MAMMOGRAM  03/08/2019  . COVID-19 Vaccine (1) 08/09/2019 (Originally 10/31/1957)  . TETANUS/TDAP  03/21/2020 (Originally 10/30/1965)  . INFLUENZA VACCINE  08/19/2019  . HEMOGLOBIN A1C  01/16/2020  . FOOT EXAM  07/16/2020  . COLONOSCOPY  02/09/2027  . DEXA SCAN  Completed  . Hepatitis C Screening  Completed    Chart Review Today: I personally reviewed active problem list, medication list, allergies, family history, social history, health maintenance, notes from  last encounter, lab results, imaging with the patient/caregiver today.   Review of Systems   Objective:   Vitals:   07/24/19 0821  Pulse: 74  Resp: 18  Temp: 97.9 F (36.6 C)  TempSrc: Temporal  SpO2: 98%  Weight: 229 lb 12.8 oz (104.2 kg)  Height: 5\' 6"  (1.676 m)    Body mass index is 37.09 kg/m.  Physical Exam Vitals and nursing note reviewed.  Constitutional:      General: She is not in acute distress.    Appearance: Normal appearance. She is well-developed. She is obese. She is not ill-appearing, toxic-appearing or diaphoretic.     Interventions: Face mask in place.  HENT:     Head: Normocephalic and atraumatic.     Right Ear: External ear normal.     Left Ear: External ear normal.  Eyes:  General: Lids are normal. No scleral icterus.       Right eye: No discharge.        Left eye: No discharge.     Conjunctiva/sclera: Conjunctivae normal.  Neck:     Trachea: Phonation normal. No tracheal deviation.  Cardiovascular:     Rate and Rhythm: Normal rate and regular rhythm.     Pulses: Normal pulses.          Radial pulses are 2+ on the right side and 2+ on the left side.       Posterior tibial pulses are 2+ on the right side and 2+ on the left side.     Heart sounds: Normal heart sounds. No murmur heard.  No friction rub. No gallop.   Pulmonary:     Effort: Pulmonary effort is normal. No respiratory distress.     Breath sounds: Normal breath sounds. No stridor. No wheezing, rhonchi or rales.  Chest:     Chest wall: No tenderness.  Abdominal:     General: Bowel sounds are normal. There is no distension.     Palpations: Abdomen is soft.  Musculoskeletal:     Cervical back: Normal range of motion and neck supple. No tenderness (muscle ttp, no midline ttp).     Comments:  grossly normal sensation to light touch to bilateral lower extremities  Lymphadenopathy:     Cervical: No cervical adenopathy.  Skin:    General: Skin is warm and dry.     Coloration: Skin  is not jaundiced or pale.     Findings: No rash.  Neurological:     Mental Status: She is alert. Mental status is at baseline.     Motor: No abnormal muscle tone.  Psychiatric:        Mood and Affect: Mood normal.        Speech: Speech normal.        Behavior: Behavior normal.         Assessment & Plan:     ICD-10-CM   1. Hyperlipidemia, unspecified hyperlipidemia type  E78.5    compliant with statin, no SE and myalgias, encouraged continued healthy diet and weight loss  2. Essential hypertension  I10 hydrochlorothiazide (HYDRODIURIL) 25 MG tablet   stable, well controlled  3. Bilateral lower extremity edema  R60.0 hydrochlorothiazide (HYDRODIURIL) 25 MG tablet   conservative management - elevate, avoid statis, compression stockings, low salt diet  4. Low back pain without sciatica, unspecified back pain laterality, unspecified chronicity  M54.5 meloxicam (MOBIC) 7.5 MG tablet   reported as chronic, no hx or imaging available, worse daily with activity, no sciatica or radicular sx, conservative tx, f/up may need PT/MRI etc  5. Neck and shoulder pain  M54.2 meloxicam (MOBIC) 7.5 MG tablet   M25.519    intermittent, declineds PT, gentle stretching, heat tx, NSAIDs sparingly, musle relaxers prn  6. Diabetes mellitus type 2 with complications, uncontrolled ([facility])  E11.8    E11.65    per endo, uncontrolled, LAst A1C 9  7. Class 2 drug-induced obesity with serious comorbidity and body mass index (BMI) of 37.0 to 37.9 in adult  E66.1    Z68.52    with HTN, HLD, IDDM - have offered DM and nutritional education/referral multiple times   Pt given info on neck exercises, she was encouraged to f/up in one month if not improving, I have strongly recommended PT referral today and previously with back pain, but she has declined several times.    Return  in about 6 months (around 01/24/2020) for Routine follow-up.  One month f/up if continued neck/shoulders/back pain  [name], PA-C 07/24/19  8:43 AM

## 2019-07-24 NOTE — Patient Instructions (Addendum)
Call me in the next month if your neck and shoulder are not feeling better - I do want do have you get evaluated by a physical therapist    Neck Exercises Ask your health care provider which exercises are safe for you. Do exercises exactly as told by your health care provider and adjust them as directed. It is normal to feel mild stretching, pulling, tightness, or discomfort as you do these exercises. Stop right away if you feel sudden pain or your pain gets worse. Do not begin these exercises until told by your health care provider. Neck exercises can be important for many reasons. They can improve strength and maintain flexibility in your neck, which will help your upper back and prevent neck pain. Stretching exercises Rotation neck stretching  1. Sit in a chair or stand up. 2. Place your feet flat on the floor, shoulder width apart. 3. Slowly turn your head (rotate) to the right until a slight stretch is felt. Turn it all the way to the right so you can look over your right shoulder. Do not tilt or tip your head. 4. Hold this position for 10-30 seconds. 5. Slowly turn your head (rotate) to the left until a slight stretch is felt. Turn it all the way to the left so you can look over your left shoulder. Do not tilt or tip your head. 6. Hold this position for 10-30 seconds. Repeat __________ times. Complete this exercise __________ times a day. Neck retraction 1. Sit in a sturdy chair or stand up. 2. Look straight ahead. Do not bend your neck. 3. Use your fingers to push your chin backward (retraction). Do not bend your neck for this movement. Continue to face straight ahead. If you are doing the exercise properly, you will feel a slight sensation in your throat and a stretch at the back of your neck. 4. Hold the stretch for 1-2 seconds. Repeat __________ times. Complete this exercise __________ times a day. Strengthening exercises Neck press 1. Lie on your back on a firm bed or on the floor  with a pillow under your head. 2. Use your neck muscles to push your head down on the pillow and straighten your spine. 3. Hold the position as well as you can. Keep your head facing up (in a neutral position) and your chin tucked. 4. Slowly count to 5 while holding this position. Repeat __________ times. Complete this exercise __________ times a day. Isometrics These are exercises in which you strengthen the muscles in your neck while keeping your neck still (isometrics). 1. Sit in a supportive chair and place your hand on your forehead. 2. Keep your head and face facing straight ahead. Do not flex or extend your neck while doing isometrics. 3. Push forward with your head and neck while pushing back with your hand. Hold for 10 seconds. 4. Do the sequence again, this time putting your hand against the back of your head. Use your head and neck to push backward against the hand pressure. 5. Finally, do the same exercise on either side of your head, pushing sideways against the pressure of your hand. Repeat __________ times. Complete this exercise __________ times a day. Prone head lifts 1. Lie face-down (prone position), resting on your elbows so that your chest and upper back are raised. 2. Start with your head facing downward, near your chest. Position your chin either on or near your chest. 3. Slowly lift your head upward. Lift until you are looking straight ahead.  Then continue lifting your head as far back as you can comfortably stretch. 4. Hold your head up for 5 seconds. Then slowly lower it to your starting position. Repeat __________ times. Complete this exercise __________ times a day. Supine head lifts 1. Lie on your back (supine position), bending your knees to point to the ceiling and keeping your feet flat on the floor. 2. Lift your head slowly off the floor, raising your chin toward your chest. 3. Hold for 5 seconds. Repeat __________ times. Complete this exercise __________ times  a day. Scapular retraction 1. Stand with your arms at your sides. Look straight ahead. 2. Slowly pull both shoulders (scapulae) backward and downward (retraction) until you feel a stretch between your shoulder blades in your upper back. 3. Hold for 10-30 seconds. 4. Relax and repeat. Repeat __________ times. Complete this exercise __________ times a day. Contact a health care provider if:  Your neck pain or discomfort gets much worse when you do an exercise.  Your neck pain or discomfort does not improve within 2 hours after you exercise. If you have any of these problems, stop exercising right away. Do not do the exercises again unless your health care provider says that you can. Get help right away if:  You develop sudden, severe neck pain. If this happens, stop exercising right away. Do not do the exercises again unless your health care provider says that you can. This information is not intended to replace advice given to you by your health care provider. Make sure you discuss any questions you have with your health care provider. Document Revised: 11/02/2017 Document Reviewed: 11/02/2017 Elsevier Patient Education  [place].

## 2019-07-30 ENCOUNTER — Other Ambulatory Visit: Payer: Self-pay | Admitting: Family Medicine

## 2019-07-30 DIAGNOSIS — E119 Type 2 diabetes mellitus without complications: Secondary | ICD-10-CM

## 2019-08-01 ENCOUNTER — Other Ambulatory Visit: Payer: Self-pay | Admitting: Family Medicine

## 2019-08-08 ENCOUNTER — Other Ambulatory Visit: Payer: Self-pay | Admitting: Family Medicine

## 2019-08-17 ENCOUNTER — Other Ambulatory Visit: Payer: Self-pay | Admitting: Family Medicine

## 2019-08-23 ENCOUNTER — Other Ambulatory Visit: Payer: Self-pay

## 2019-08-23 DIAGNOSIS — E119 Type 2 diabetes mellitus without complications: Secondary | ICD-10-CM

## 2019-08-23 MED ORDER — GLUCOSE BLOOD VI STRP: ORAL_STRIP | 12 refills | Status: DC

## 2019-08-23 MED ORDER — BD SWAB SINGLE USE REGULAR PADS: MEDICATED_PAD | 5 refills | Status: DC

## 2019-08-23 MED ORDER — ACCU-CHEK SOFTCLIX LANCETS MISC: 1 refills | Status: DC

## 2019-08-29 ENCOUNTER — Other Ambulatory Visit: Payer: Self-pay

## 2019-08-29 DIAGNOSIS — IMO0002 Reserved for concepts with insufficient information to code with codable children: Secondary | ICD-10-CM

## 2019-08-29 MED ORDER — ACCU-CHEK GUIDE W/DEVICE KIT: 1.0000 "application " | PACK | Freq: Once | 0 refills | Status: AC

## 2019-09-04 ENCOUNTER — Other Ambulatory Visit: Payer: Self-pay | Admitting: Family Medicine

## 2019-09-04 ENCOUNTER — Other Ambulatory Visit: Payer: Self-pay

## 2019-09-04 MED ORDER — TRUE METRIX METER W/DEVICE KIT: 1.0000 | PACK | Freq: Three times a day (TID) | 0 refills | Status: DC

## 2019-09-04 MED ORDER — TRUE METRIX BLOOD GLUCOSE TEST VI STRP: ORAL_STRIP | 12 refills | Status: DC

## 2019-09-04 MED ORDER — TRUEPLUS LANCETS 33G MISC: 1.0000 | Freq: Three times a day (TID) | 11 refills | Status: DC

## 2019-09-17 ENCOUNTER — Encounter: Payer: Self-pay | Admitting: Endocrinology

## 2019-09-17 ENCOUNTER — Ambulatory Visit (INDEPENDENT_AMBULATORY_CARE_PROVIDER_SITE_OTHER): Payer: Medicare HMO | Admitting: Endocrinology

## 2019-09-17 ENCOUNTER — Other Ambulatory Visit: Payer: Self-pay

## 2019-09-17 VITALS — BP 140/70 | HR 91 | Ht 66.0 in | Wt 231.0 lb

## 2019-09-17 DIAGNOSIS — E1165 Type 2 diabetes mellitus with hyperglycemia: Secondary | ICD-10-CM | POA: Diagnosis not present

## 2019-09-17 DIAGNOSIS — E118 Type 2 diabetes mellitus with unspecified complications: Secondary | ICD-10-CM

## 2019-09-17 DIAGNOSIS — IMO0002 Reserved for concepts with insufficient information to code with codable children: Secondary | ICD-10-CM

## 2019-09-17 LAB — POCT GLYCOSYLATED HEMOGLOBIN (HGB A1C): Hemoglobin A1C: 7.4 % — AB (ref 4.0–5.6)

## 2019-09-17 NOTE — Progress Notes (Signed)
Subjective:    Patient ID: Tamara [name], female    DOB: 10-08-1945, 74 y.o.   MRN: [id number]  HPI Pt returns for f/u of diabetes mellitus: DM type: Insulin-requiring type 2 Dx'ed: 1324 Complications: none Therapy: insulin since 2021, and metformin.   GDM: never DKA: never Severe hypoglycemia: never Pancreatitis: never Pancreatic imaging: never SDOH: she cannot afford brand name meds.  Other: she also took insulin 2017-2018; she removed the need for insulin by losing 40 lbs; she declines weight loss surgery.   Interval history: she brings her meter with her cbg's which I have reviewed today.  cbg's vary from 85-218.  It is in general higher as the day goes on.  She takes insulin as rx'ed.   Past Medical History:  Diagnosis Date  . Diabetes mellitus without complication ([place])   . Gastritis 09/05/2017  . GERD (gastroesophageal reflux disease)   . Hyperlipidemia   . Hypertension   . RUQ pain 03/09/2017   Per patient, she has had RUQ pain 4-5 years that has grown worse in the last few months.    Past Surgical History:  Procedure Laterality Date  . BREAST BIOPSY Left 2008   CORE W/CLIP - NEG  . COLONOSCOPY WITH PROPOFOL N/A 02/08/2017   Procedure: COLONOSCOPY WITH PROPOFOL;  Surgeon: Lollie Sails, MD;  Location: [facility] ENDOSCOPY;  Service: Endoscopy;  Laterality: N/A;  . ESOPHAGOGASTRODUODENOSCOPY (EGD) WITH PROPOFOL N/A 02/08/2017   Procedure: ESOPHAGOGASTRODUODENOSCOPY (EGD) WITH PROPOFOL;  Surgeon: Lollie Sails, MD;  Location: [facility] ENDOSCOPY;  Service: Endoscopy;  Laterality: N/A;  . TUBAL LIGATION      Social History   Socioeconomic History  . Marital status: Married    Spouse name: Not on file  . Number of children: 3  . Years of education: Not on file  . Highest education level: High school graduate  Occupational History  . Occupation: retired  Tobacco Use  . Smoking status: Former Research scientist (life sciences)  . Smokeless tobacco: Never Used  Vaping Use  . Vaping Use:  Never assessed  Substance and Sexual Activity  . Alcohol use: No  . Drug use: No  . Sexual activity: Not on file  Other Topics Concern  . Not on file  Social History Narrative  . Not on file   Social Determinants of Health   Financial Resource Strain: Low Risk   . Difficulty of Paying Living Expenses: Not hard at all  Food Insecurity: No Food Insecurity  . Worried About Charity fundraiser in the Last Year: Never true  . Ran Out of Food in the Last Year: Never true  Transportation Needs: No Transportation Needs  . Lack of Transportation (Medical): No  . Lack of Transportation (Non-Medical): No  Physical Activity: Inactive  . Days of Exercise per Week: 0 days  . Minutes of Exercise per Session: 0 min  Stress: No Stress Concern Present  . Feeling of Stress : Not at all  Social Connections: Unknown  . Frequency of Communication with Friends and Family: Patient refused  . Frequency of Social Gatherings with Friends and Family: Patient refused  . Attends Religious Services: Patient refused  . Active Member of Clubs or Organizations: Patient refused  . Attends Archivist Meetings: Patient refused  . Marital Status: Married  Human resources officer Violence: Not At Risk  . Fear of Current or Ex-Partner: No  . Emotionally Abused: No  . Physically Abused: No  . Sexually Abused: No    Current Outpatient Medications on  File Prior to Visit  Medication Sig Dispense Refill  . Alcohol Swabs (B-D SINGLE USE SWABS REGULAR) PADS TEST BLOOD SUGAR TWICE DAILY DX:E11.65 100 each 5  . Blood Glucose Monitoring Suppl (TRUE METRIX METER) w/Device KIT 1 applicator by Does not apply route 3 (three) times daily. TID DX:E11.8 LON:99 (Patient taking differently: 1 each by Does not apply route 2 (two) times daily. E11.9) 1 kit 0  . glucose blood (TRUE METRIX BLOOD GLUCOSE TEST) test strip TID DX:E11.8 LON:99 (Patient taking differently: 1 each by Other route 2 (two) times daily. E11.9) 100 each 12  .  hydrochlorothiazide (HYDRODIURIL) 25 MG tablet Take 1 tablet (25 mg total) by mouth daily. 90 tablet 3  . insulin NPH Human (NOVOLIN N) 100 UNIT/ML injection Inject 0.6 mLs (60 Units total) into the skin every morning. And syringes 1/day (Patient taking differently: Inject 60 Units into the skin every morning. ) 10 mL 11  . Insulin Syringe-Needle U-100 (INSULIN SYRINGE 1CC/31GX5/16") 31G X 5/16" 1 ML MISC 1 Syringe by Does not apply route daily. (Patient taking differently: 1 each by Does not apply route daily. E11.9) 90 each 3  . levothyroxine (SYNTHROID) 25 MCG tablet Take 1 tablet (25 mcg total) by mouth daily before breakfast. 90 tablet 3  . lisinopril (ZESTRIL) 10 MG tablet Take 1 tablet (10 mg total) by mouth daily. 90 tablet 3  . meloxicam (MOBIC) 7.5 MG tablet Take 1 tablet (7.5 mg total) by mouth daily. 90 tablet 3  . metFORMIN (GLUCOPHAGE) 1000 MG tablet TAKE 1 TABLET TWICE DAILY WITH MEALS 180 tablet 1  . nystatin cream (MYCOSTATIN) Apply 1 application topically 2 (two) times daily. 30 g 0  . pantoprazole (PROTONIX) 40 MG tablet Take 1 tablet (40 mg total) by mouth daily. 90 tablet 3  . potassium chloride SA (KLOR-CON) 20 MEQ tablet TAKE 1 TABLET EVERY DAY 90 tablet 1  . rosuvastatin (CRESTOR) 20 MG tablet Take 1 tablet (20 mg total) by mouth at bedtime. 90 tablet 3  . tiZANidine (ZANAFLEX) 4 MG tablet TAKE 1 TABLET (4 MG TOTAL) BY MOUTH EVERY 8 (EIGHT) HOURS AS NEEDED FOR MUSCLE SPASMS. 30 tablet 0  . triamcinolone ointment (KENALOG) 0.1 % Apply 1 application topically 2 (two) times daily. 30 g 0  . TRUEplus Lancets 33G MISC 1 applicator by Does not apply route 3 (three) times daily. TID DX:E11.8 LON:99 (Patient taking differently: 1 each by Does not apply route 2 (two) times daily. E11.9) 100 each 11   No current facility-administered medications on file prior to visit.    Allergies  Allergen Reactions  . Penicillins Hives    Family History  Problem Relation Age of Onset  .  Diabetes Maternal Grandmother   . Breast cancer Neg Hx     BP 140/70   Pulse 91   Ht 5\' 6"  (1.676 m)   Wt 231 lb (104.8 kg)   SpO2 93%   BMI 37.28 kg/m    Review of Systems She denies hypoglycemia.      Objective:   Physical Exam VITAL SIGNS:  See vs page GENERAL: no distress Pulses: dorsalis pedis intact bilat.   MSK: no deformity of the feet CV: no leg edema Skin:  no ulcer on the feet.  normal color and temp on the feet. Neuro: sensation is intact to touch on the feet  Lab Results  Component Value Date   HGBA1C 7.4 (A) 09/17/2019       Assessment & Plan:  Insulin-requiring  type 2 DM: this is the best control this pt should aim for, given this regimen, which does match insulin to her changing needs throughout the day  Patient Instructions  check your blood sugar once a day.  vary the time of day when you check, between before the 3 meals, and at bedtime.  also check if you have symptoms of your blood sugar being too high or too low.  please keep a record of the readings and bring it to your next appointment here (or you can bring the meter itself).  You can write it on any piece of paper.  please call us sooner if your blood sugar goes below 70, or if you have a lot of readings over 200. Please continue the same medications.   On this type of insulin schedule, you should eat meals on a regular schedule.  If a meal is missed or significantly delayed, your blood sugar could go low.   Please come back for a follow-up appointment in 3 months.

## 2019-09-17 NOTE — Patient Instructions (Addendum)
check your blood sugar once a day.  vary the time of day when you check, between before the 3 meals, and at bedtime.  also check if you have symptoms of your blood sugar being too high or too low.  please keep a record of the readings and bring it to your next appointment here (or you can bring the meter itself).  You can write it on any piece of paper.  please call us sooner if your blood sugar goes below 70, or if you have a lot of readings over 200. Please continue the same medications.   On this type of insulin schedule, you should eat meals on a regular schedule.  If a meal is missed or significantly delayed, your blood sugar could go low.   Please come back for a follow-up appointment in 3 months.

## 2019-10-16 ENCOUNTER — Ambulatory Visit (INDEPENDENT_AMBULATORY_CARE_PROVIDER_SITE_OTHER): Payer: Medicare HMO

## 2019-10-16 ENCOUNTER — Other Ambulatory Visit: Payer: Self-pay

## 2019-10-16 VITALS — BP 128/70 | HR 81 | Temp 98.2°F | Resp 16 | Ht 66.0 in | Wt 231.5 lb

## 2019-10-16 DIAGNOSIS — Z0000 Encounter for general adult medical examination without abnormal findings: Secondary | ICD-10-CM | POA: Diagnosis not present

## 2019-10-16 DIAGNOSIS — Z23 Encounter for immunization: Secondary | ICD-10-CM | POA: Diagnosis not present

## 2019-10-16 NOTE — Patient Instructions (Signed)
Tamara [name] , Thank you for taking time to come for your Medicare Wellness Visit. I appreciate your ongoing commitment to your health goals. Please review the following plan we discussed and let me know if I can assist you in the future.   Screening recommendations/referrals: Colonoscopy: done 02/08/17 Mammogram: done 03/07/18. Please call [phone] to schedule your mammogram.  Bone Density: done 03/07/18 Recommended yearly ophthalmology/optometry visit for glaucoma screening and checkup Recommended yearly dental visit for hygiene and checkup  Vaccinations: Influenza vaccine: done today Pneumococcal vaccine: done today Tdap vaccine: due Shingles vaccine: Shingrix discussed. Please contact your pharmacy for coverage information.  Covid-19: discussed  Advanced directives: Advance directive discussed with you today. Even though you declined this today please call our office should you change your mind and we can give you the proper paperwork for you to fill out.  Conditions/risks identified: Recommend drinking 6-8 glasses of water per day  Next appointment: Follow up in one year for your annual wellness visit    Preventive Care 65 Years and Older, Female Preventive care refers to lifestyle choices and visits with your health care provider that can promote health and wellness. What does preventive care include?  A yearly physical exam. This is also called an annual well check.  Dental exams once or twice a year.  Routine eye exams. Ask your health care provider how often you should have your eyes checked.  Personal lifestyle choices, including:  Daily care of your teeth and gums.  Regular physical activity.  Eating a healthy diet.  Avoiding tobacco and drug use.  Limiting alcohol use.  Practicing safe sex.  Taking low-dose aspirin every day.  Taking vitamin and mineral supplements as recommended by your health care provider. What happens during an annual well check? The  services and screenings done by your health care provider during your annual well check will depend on your age, overall health, lifestyle risk factors, and family history of disease. Counseling  Your health care provider may ask you questions about your:  Alcohol use.  Tobacco use.  Drug use.  Emotional well-being.  Home and relationship well-being.  Sexual activity.  Eating habits.  History of falls.  Memory and ability to understand (cognition).  Work and work Statistician.  Reproductive health. Screening  You may have the following tests or measurements:  Height, weight, and BMI.  Blood pressure.  Lipid and cholesterol levels. These may be checked every 5 years, or more frequently if you are over 70 years old.  Skin check.  Lung cancer screening. You may have this screening every year starting at age 44 if you have a 30-pack-year history of smoking and currently smoke or have quit within the past 15 years.  Fecal occult blood test (FOBT) of the stool. You may have this test every year starting at age 36.  Flexible sigmoidoscopy or colonoscopy. You may have a sigmoidoscopy every 5 years or a colonoscopy every 10 years starting at age 39.  Hepatitis C blood test.  Hepatitis B blood test.  Sexually transmitted disease (STD) testing.  Diabetes screening. This is done by checking your blood sugar (glucose) after you have not eaten for a while (fasting). You may have this done every 1-3 years.  Bone density scan. This is done to screen for osteoporosis. You may have this done starting at age 70.  Mammogram. This may be done every 1-2 years. Talk to your health care provider about how often you should have regular mammograms. Talk with your  health care provider about your test results, treatment options, and if necessary, the need for more tests. Vaccines  Your health care provider may recommend certain vaccines, such as:  Influenza vaccine. This is recommended  every year.  Tetanus, diphtheria, and acellular pertussis (Tdap, Td) vaccine. You may need a Td booster every 10 years.  Zoster vaccine. You may need this after age 40.  Pneumococcal 13-valent conjugate (PCV13) vaccine. One dose is recommended after age 61.  Pneumococcal polysaccharide (PPSV23) vaccine. One dose is recommended after age 14. Talk to your health care provider about which screenings and vaccines you need and how often you need them. This information is not intended to replace advice given to you by your health care provider. Make sure you discuss any questions you have with your health care provider. Document Released: 01/31/2015 Document Revised: 09/24/2015 Document Reviewed: 11/05/2014 Elsevier Interactive Patient Education  2017 [place] Prevention in the Home Falls can cause injuries. They can happen to people of all ages. There are many things you can do to make your home safe and to help prevent falls. What can I do on the outside of my home?  Regularly fix the edges of walkways and driveways and fix any cracks.  Remove anything that might make you trip as you walk through a door, such as a raised step or threshold.  Trim any bushes or trees on the path to your home.  Use bright outdoor lighting.  Clear any walking paths of anything that might make someone trip, such as rocks or tools.  Regularly check to see if handrails are loose or broken. Make sure that both sides of any steps have handrails.  Any raised decks and porches should have guardrails on the edges.  Have any leaves, snow, or ice cleared regularly.  Use sand or salt on walking paths during winter.  Clean up any spills in your garage right away. This includes oil or grease spills. What can I do in the bathroom?  Use night lights.  Install grab bars by the toilet and in the tub and shower. Do not use towel bars as grab bars.  Use non-skid mats or decals in the tub or shower.  If  you need to sit down in the shower, use a plastic, non-slip stool.  Keep the floor dry. Clean up any water that spills on the floor as soon as it happens.  Remove soap buildup in the tub or shower regularly.  Attach bath mats securely with double-sided non-slip rug tape.  Do not have throw rugs and other things on the floor that can make you trip. What can I do in the bedroom?  Use night lights.  Make sure that you have a light by your bed that is easy to reach.  Do not use any sheets or blankets that are too big for your bed. They should not hang down onto the floor.  Have a firm chair that has side arms. You can use this for support while you get dressed.  Do not have throw rugs and other things on the floor that can make you trip. What can I do in the kitchen?  Clean up any spills right away.  Avoid walking on wet floors.  Keep items that you use a lot in easy-to-reach places.  If you need to reach something above you, use a strong step stool that has a grab bar.  Keep electrical cords out of the way.  Do not use  floor polish or wax that makes floors slippery. If you must use wax, use non-skid floor wax.  Do not have throw rugs and other things on the floor that can make you trip. What can I do with my stairs?  Do not leave any items on the stairs.  Make sure that there are handrails on both sides of the stairs and use them. Fix handrails that are broken or loose. Make sure that handrails are as long as the stairways.  Check any carpeting to make sure that it is firmly attached to the stairs. Fix any carpet that is loose or worn.  Avoid having throw rugs at the top or bottom of the stairs. If you do have throw rugs, attach them to the floor with carpet tape.  Make sure that you have a light switch at the top of the stairs and the bottom of the stairs. If you do not have them, ask someone to add them for you. What else can I do to help prevent falls?  Wear shoes  that:  Do not have high heels.  Have rubber bottoms.  Are comfortable and fit you well.  Are closed at the toe. Do not wear sandals.  If you use a stepladder:  Make sure that it is fully opened. Do not climb a closed stepladder.  Make sure that both sides of the stepladder are locked into place.  Ask someone to hold it for you, if possible.  Clearly mark and make sure that you can see:  Any grab bars or handrails.  First and last steps.  Where the edge of each step is.  Use tools that help you move around (mobility aids) if they are needed. These include:  Canes.  Walkers.  Scooters.  Crutches.  Turn on the lights when you go into a dark area. Replace any light bulbs as soon as they burn out.  Set up your furniture so you have a clear path. Avoid moving your furniture around.  If any of your floors are uneven, fix them.  If there are any pets around you, be aware of where they are.  Review your medicines with your doctor. Some medicines can make you feel dizzy. This can increase your chance of falling. Ask your doctor what other things that you can do to help prevent falls. This information is not intended to replace advice given to you by your health care provider. Make sure you discuss any questions you have with your health care provider. Document Released: 10/31/2008 Document Revised: 06/12/2015 Document Reviewed: 02/08/2014 Elsevier Interactive Patient Education  2017 [place].  Influenza (Flu) Vaccine (Inactivated or Recombinant): What You Need to Know 1. Why get vaccinated? Influenza vaccine can prevent influenza (flu). Flu is a contagious disease that spreads around the [place] every year, usually between October and May. Anyone can get the flu, but it is more dangerous for some people. Infants and young children, people 96 years of age and older, pregnant women, and people with certain health conditions or a weakened immune system are at  greatest risk of flu complications. Pneumonia, bronchitis, sinus infections and ear infections are examples of flu-related complications. If you have a medical condition, such as heart disease, cancer or diabetes, flu can make it worse. Flu can cause fever and chills, sore throat, muscle aches, fatigue, cough, headache, and runny or stuffy nose. Some people may have vomiting and diarrhea, though this is more common in children than adults. Each year thousands of  people in the [place] die from flu, and many more are hospitalized. Flu vaccine prevents millions of illnesses and flu-related visits to the doctor each year. 2. Influenza vaccine CDC recommends everyone 51 months of age and older get vaccinated every flu season. Children 6 months through 34 years of age may need 2 doses during a single flu season. Everyone else needs only 1 dose each flu season. It takes about 2 weeks for protection to develop after vaccination. There are many flu viruses, and they are always changing. Each year a new flu vaccine is made to protect against three or four viruses that are likely to cause disease in the upcoming flu season. Even when the vaccine doesn't exactly match these viruses, it may still provide some protection. Influenza vaccine does not cause flu. Influenza vaccine may be given at the same time as other vaccines. 3. Talk with your health care provider Tell your vaccine provider if the person getting the vaccine:  Has had an allergic reaction after a previous dose of influenza vaccine, or has any severe, life-threatening allergies.  Has ever had Guillain-Barr Syndrome (also called GBS). In some cases, your health care provider may decide to postpone influenza vaccination to a future visit. People with minor illnesses, such as a cold, may be vaccinated. People who are moderately or severely ill should usually wait until they recover before getting influenza vaccine. Your health care provider can  give you more information. 4. Risks of a vaccine reaction  Soreness, redness, and swelling where shot is given, fever, muscle aches, and headache can happen after influenza vaccine.  There may be a very small increased risk of Guillain-Barr Syndrome (GBS) after inactivated influenza vaccine (the flu shot). Young children who get the flu shot along with pneumococcal vaccine (PCV13), and/or DTaP vaccine at the same time might be slightly more likely to have a seizure caused by fever. Tell your health care provider if a child who is getting flu vaccine has ever had a seizure. People sometimes faint after medical procedures, including vaccination. Tell your provider if you feel dizzy or have vision changes or ringing in the ears. As with any medicine, there is a very remote chance of a vaccine causing a severe allergic reaction, other serious injury, or death. 5. What if there is a serious problem? An allergic reaction could occur after the vaccinated person leaves the clinic. If you see signs of a severe allergic reaction (hives, swelling of the face and throat, difficulty breathing, a fast heartbeat, dizziness, or weakness), call 9-1-1 and get the person to the nearest hospital. For other signs that concern you, call your health care provider. Adverse reactions should be reported to the Vaccine Adverse Event Reporting System (VAERS). Your health care provider will usually file this report, or you can do it yourself. Visit the VAERS website at www.vaers.SamedayNews.es or call 414-828-5574.VAERS is only for reporting reactions, and VAERS staff do not give medical advice. 6. The National Vaccine Injury Compensation Program The [employer] Vaccine Injury Compensation Program (VICP) is a federal program that was created to compensate people who may have been injured by certain vaccines. Visit the VICP website at GoldCloset.com.ee or call 857-738-6577 to learn about the program and about filing a  claim. There is a time limit to file a claim for compensation. 7. How can I learn more?  Ask your healthcare provider.  Call your local or state health department.  Contact the Centers for Disease Control and Prevention (CDC): ? Call  (519) 151-5821 (1-800-CDC-INFO) or ? Visit CDC's https://gibson.com/ Vaccine Information Statement (Interim) Inactivated Influenza Vaccine (09/01/2017) This information is not intended to replace advice given to you by your health care provider. Make sure you discuss any questions you have with your health care provider. Document Revised: 04/25/2018 Document Reviewed: 09/05/2017 Elsevier Patient Education  [place].   Pneumococcal Conjugate Vaccine (PCV13): What You Need to Know 1. Why get vaccinated? Pneumococcal conjugate vaccine (PCV13) can prevent pneumococcal disease. Pneumococcal disease refers to any illness caused by pneumococcal bacteria. These bacteria can cause many types of illnesses, including pneumonia, which is an infection of the lungs. Pneumococcal bacteria are one of the most common causes of pneumonia. Besides pneumonia, pneumococcal bacteria can also cause:  Ear infections  Sinus infections  Meningitis (infection of the tissue covering the brain and spinal cord)  Bacteremia (bloodstream infection) Anyone can get pneumococcal disease, but children under 44 years of age, people with certain medical conditions, adults 40 years or older, and cigarette smokers are at the highest risk. Most pneumococcal infections are mild. However, some can result in long-term problems, such as brain damage or hearing loss. Meningitis, bacteremia, and pneumonia caused by pneumococcal disease can be fatal. 2. PCV13 PCV13 protects against 13 types of bacteria that cause pneumococcal disease. Infants and young children usually need 4 doses of pneumococcal conjugate vaccine, at 2, 4, 6, and 20-59 months of age. In some cases, a child might need fewer than  4 doses to complete PCV13 vaccination. A dose of PCV23 vaccine is also recommended for anyone 2 years or older with certain medical conditions if they did not already receive PCV13. This vaccine may be given to adults 5 years or older based on discussions between the patient and health care provider. 3. Talk with your health care provider Tell your vaccine provider if the person getting the vaccine:  Has had an allergic reaction after a previous dose of PCV13, to an earlier pneumococcal conjugate vaccine known as PCV7, or to any vaccine containing diphtheria toxoid (for example, DTaP), or has any severe, life-threatening allergies.  In some cases, your health care provider may decide to postpone PCV13 vaccination to a future visit. People with minor illnesses, such as a cold, may be vaccinated. People who are moderately or severely ill should usually wait until they recover before getting PCV13. Your health care provider can give you more information. 4. Risks of a vaccine reaction  Redness, swelling, pain, or tenderness where the shot is given, and fever, loss of appetite, fussiness (irritability), feeling tired, headache, and chills can happen after PCV13. Young children may be at increased risk for seizures caused by fever after PCV13 if it is administered at the same time as inactivated influenza vaccine. Ask your health care provider for more information. People sometimes faint after medical procedures, including vaccination. Tell your provider if you feel dizzy or have vision changes or ringing in the ears. As with any medicine, there is a very remote chance of a vaccine causing a severe allergic reaction, other serious injury, or death. 5. What if there is a serious problem? An allergic reaction could occur after the vaccinated person leaves the clinic. If you see signs of a severe allergic reaction (hives, swelling of the face and throat, difficulty breathing, a fast heartbeat, dizziness,  or weakness), call 9-1-1 and get the person to the nearest hospital. For other signs that concern you, call your health care provider. Adverse reactions should be reported to the Vaccine Adverse  Event Reporting System (VAERS). Your health care provider will usually file this report, or you can do it yourself. Visit the VAERS website at www.vaers.SamedayNews.es or call 206-603-9211. VAERS is only for reporting reactions, and VAERS staff do not give medical advice. 6. The National Vaccine Injury Compensation Program The [employer] Vaccine Injury Compensation Program (VICP) is a federal program that was created to compensate people who may have been injured by certain vaccines. Visit the VICP website at GoldCloset.com.ee or call 469-676-9027 to learn about the program and about filing a claim. There is a time limit to file a claim for compensation. 7. How can I learn more?  Ask your health care provider.  Call your local or state health department.  Contact the Centers for Disease Control and Prevention (CDC): ? Call (347)269-0157 (1-800-CDC-INFO) or ? Visit CDC's website at http://hunter.com/ Vaccine Information Statement PCV13 Vaccine (11/16/2017) This information is not intended to replace advice given to you by your health care provider. Make sure you discuss any questions you have with your health care provider. Document Revised: 04/25/2018 Document Reviewed: 08/16/2017 Elsevier Patient Education  [place].

## 2019-10-16 NOTE — Progress Notes (Signed)
Subjective:   Tamara [name] is a 74 y.o. female who presents for Medicare Annual (Subsequent) preventive examination.  Review of Systems     Cardiac Risk Factors include: advanced age (>10men, >86 women);diabetes mellitus;dyslipidemia;hypertension;obesity (BMI >30kg/m2)     Objective:    Today's Vitals   10/16/19 1142  BP: 128/70  Pulse: 81  Resp: 16  Temp: 98.2 F (36.8 C)  TempSrc: Oral  SpO2: 98%  Weight: 231 lb 8 oz (105 kg)  Height: $Remove'5\' 6"'qcPfyrG$  (1.676 m)   Body mass index is 37.37 kg/m.  Advanced Directives 10/10/2018 02/08/2017  Does Patient Have a Medical Advance Directive? No No  Would patient like information on creating a medical advance directive? Yes (MAU/Ambulatory/Procedural Areas - Information given) No - Patient declined    Current Medications (verified) Outpatient Encounter Medications as of 10/16/2019  Medication Sig  . Alcohol Swabs (B-D SINGLE USE SWABS REGULAR) PADS TEST BLOOD SUGAR TWICE DAILY DX:E11.65  . Blood Glucose Monitoring Suppl (TRUE METRIX METER) w/Device KIT 1 applicator by Does not apply route 3 (three) times daily. TID DX:E11.8 LON:99 (Patient taking differently: 1 each by Does not apply route 2 (two) times daily. E11.9)  . glucose blood (TRUE METRIX BLOOD GLUCOSE TEST) test strip TID DX:E11.8 LON:99 (Patient taking differently: 1 each by Other route 2 (two) times daily. E11.9)  . hydrochlorothiazide (HYDRODIURIL) 25 MG tablet Take 1 tablet (25 mg total) by mouth daily.  . insulin NPH Human (NOVOLIN N) 100 UNIT/ML injection Inject 0.6 mLs (60 Units total) into the skin every morning. And syringes 1/day (Patient taking differently: Inject 60 Units into the skin every morning. )  . Insulin Syringe-Needle U-100 (INSULIN SYRINGE 1CC/31GX5/16") 31G X 5/16" 1 ML MISC 1 Syringe by Does not apply route daily. (Patient taking differently: 1 each by Does not apply route daily. E11.9)  . levothyroxine (SYNTHROID) 25 MCG tablet Take 1 tablet (25 mcg total)  by mouth daily before breakfast.  . lisinopril (ZESTRIL) 10 MG tablet Take 1 tablet (10 mg total) by mouth daily.  . meloxicam (MOBIC) 7.5 MG tablet Take 1 tablet (7.5 mg total) by mouth daily.  . metFORMIN (GLUCOPHAGE) 1000 MG tablet TAKE 1 TABLET TWICE DAILY WITH MEALS  . pantoprazole (PROTONIX) 40 MG tablet Take 1 tablet (40 mg total) by mouth daily.  . potassium chloride SA (KLOR-CON) 20 MEQ tablet TAKE 1 TABLET EVERY DAY  . rosuvastatin (CRESTOR) 20 MG tablet Take 1 tablet (20 mg total) by mouth at bedtime.  Marland Kitchen tiZANidine (ZANAFLEX) 4 MG tablet TAKE 1 TABLET (4 MG TOTAL) BY MOUTH EVERY 8 (EIGHT) HOURS AS NEEDED FOR MUSCLE SPASMS.  . TRUEplus Lancets 12W MISC 1 applicator by Does not apply route 3 (three) times daily. TID DX:E11.8 LON:99 (Patient taking differently: 1 each by Does not apply route 2 (two) times daily. E11.9)  . [DISCONTINUED] nystatin cream (MYCOSTATIN) Apply 1 application topically 2 (two) times daily.  . [DISCONTINUED] triamcinolone ointment (KENALOG) 0.1 % Apply 1 application topically 2 (two) times daily.   No facility-administered encounter medications on file as of 10/16/2019.    Allergies (verified) Penicillins   History: Past Medical History:  Diagnosis Date  . Diabetes mellitus without complication ([place])   . Gastritis 09/05/2017  . GERD (gastroesophageal reflux disease)   . Hyperlipidemia   . Hypertension   . RUQ pain 03/09/2017   Per patient, she has had RUQ pain 4-5 years that has grown worse in the last few months.   Past Surgical  History:  Procedure Laterality Date  . BREAST BIOPSY Left 2008   CORE W/CLIP - NEG  . COLONOSCOPY WITH PROPOFOL N/A 02/08/2017   Procedure: COLONOSCOPY WITH PROPOFOL;  Surgeon: Christena Deem, MD;  Location: [facility] ENDOSCOPY;  Service: Endoscopy;  Laterality: N/A;  . ESOPHAGOGASTRODUODENOSCOPY (EGD) WITH PROPOFOL N/A 02/08/2017   Procedure: ESOPHAGOGASTRODUODENOSCOPY (EGD) WITH PROPOFOL;  Surgeon: Christena Deem, MD;   Location: [facility] ENDOSCOPY;  Service: Endoscopy;  Laterality: N/A;  . TUBAL LIGATION     Family History  Problem Relation Age of Onset  . Diabetes Maternal Grandmother   . Breast cancer Neg Hx    Social History   Socioeconomic History  . Marital status: Married    Spouse name: Not on file  . Number of children: 3  . Years of education: Not on file  . Highest education level: High school graduate  Occupational History  . Occupation: retired  Tobacco Use  . Smoking status: Former Games developer  . Smokeless tobacco: Never Used  Vaping Use  . Vaping Use: Never used  Substance and Sexual Activity  . Alcohol use: No  . Drug use: No  . Sexual activity: Not on file  Other Topics Concern  . Not on file  Social History Narrative  . Not on file   Social Determinants of Health   Financial Resource Strain: Low Risk   . Difficulty of Paying Living Expenses: Not hard at all  Food Insecurity: No Food Insecurity  . Worried About Programme researcher, broadcasting/film/video in the Last Year: Never true  . Ran Out of Food in the Last Year: Never true  Transportation Needs: No Transportation Needs  . Lack of Transportation (Medical): No  . Lack of Transportation (Non-Medical): No  Physical Activity: Inactive  . Days of Exercise per Week: 0 days  . Minutes of Exercise per Session: 0 min  Stress: No Stress Concern Present  . Feeling of Stress : Not at all  Social Connections: Moderately Isolated  . Frequency of Communication with Friends and Family: More than three times a week  . Frequency of Social Gatherings with Friends and Family: More than three times a week  . Attends Religious Services: Never  . Active Member of Clubs or Organizations: No  . Attends Banker Meetings: Never  . Marital Status: Married    Tobacco Counseling Counseling given: Not Answered   Clinical Intake:  Pre-visit preparation completed: Yes  Pain : No/denies pain     BMI - recorded: 37.37 Nutritional Status: BMI >  30  Obese Nutritional Risks: None Diabetes: Yes CBG done?: No Did pt. bring in CBG monitor from home?: No  How often do you need to have someone help you when you read instructions, pamphlets, or other written materials from your doctor or pharmacy?: 1 - Never  Nutrition Risk Assessment:  Has the patient had any N/V/D within the last 2 months?  No  Does the patient have any non-healing wounds?  No  Has the patient had any unintentional weight loss or weight gain?  No   Diabetes:  Is the patient diabetic?  Yes  If diabetic, was a CBG obtained today?  No  Did the patient bring in their glucometer from home?  No  How often do you monitor your CBG's? daily.   Financial Strains and Diabetes Management:  Are you having any financial strains with the device, your supplies or your medication? No .  Does the patient want to be seen by Chronic  Care Management for management of their diabetes?  No  Would the patient like to be referred to a Nutritionist or for Diabetic Management?  No   Diabetic Exams:  Diabetic Eye Exam: Completed 01/23/18. Overdue for diabetic eye exam. Pt has been advised about the importance in completing this exam.   Diabetic Foot Exam: Completed 09/17/19.   Interpreter Needed?: No  Information entered by :: Clemetine Marker LPN   Activities of Daily Living In your present state of health, do you have any difficulty performing the following activities: 10/16/2019 07/24/2019  Hearing? N N  Comment declines hearing aids -  Vision? N Y  Difficulty concentrating or making decisions? N N  Walking or climbing stairs? N N  Dressing or bathing? N N  Doing errands, shopping? N N  Preparing Food and eating ? N -  Using the Toilet? N -  In the past six months, have you accidently leaked urine? N -  Do you have problems with loss of bowel control? N -  Managing your Medications? N -  Managing your Finances? N -  Housekeeping or managing your Housekeeping? N -  Some recent  data might be hidden    Patient Care Team: [name], PA-C as PCP - General (Family Medicine)  Indicate any recent Medical Services you may have received from other than [facility] providers in the past year (date may be approximate).     Assessment:   This is a routine wellness examination for Onalee.  Hearing/Vision screen  Hearing Screening   '125Hz'$  $Remo'250Hz'nHtvy$'500Hz'$'1000Hz'$'2000Hz'$'3000Hz'$'4000Hz'$'6000Hz'$'8000Hz'$   Right ear:           Left ear:           Comments: Pt denies hearing difficulty  Vision Screening Comments: Annual vision screenings done at [facility] in [place] issues and exercise activities discussed: Current Exercise Habits: The patient does not participate in regular exercise at present, Exercise limited by: None identified  Goals    . DIET - INCREASE WATER INTAKE     Recommend drinking 6-8 glasses of water per day    . Weight (lb) < 200 lb (90.7 kg)     Pt would like to lose weight over the next year with healthy eating and physical activity      Depression Screen PHQ 2/9 Scores 10/16/2019 07/24/2019 03/22/2019 11/22/2018 10/10/2018 09/20/2018 12/07/2017  PHQ - 2 Score 0 0 0 0 0 0 0  PHQ- 9 Score - 0 0 0 - 0 1    Fall Risk Fall Risk  10/16/2019 07/24/2019 03/22/2019 11/22/2018 10/10/2018  Falls in the past year? 0 0 0 0 0  Number falls in past yr: 0 0 0 0 0  Injury with Fall? 0 0 0 0 0  Risk for fall due to : No Fall Risks - - - -  Follow up Falls prevention discussed Falls evaluation completed - - Falls prevention discussed    Any stairs in or around the home? Yes  If so, are there any without handrails? No  Home free of loose throw rugs in walkways, pet beds, electrical cords, etc? Yes  Adequate lighting in your home to reduce risk of falls? Yes   ASSISTIVE DEVICES UTILIZED TO PREVENT FALLS:  Life alert? No  Use of a cane, walker or w/c? No  Grab bars in the bathroom? No  Shower chair or bench in shower? No  Elevated toilet seat or a  handicapped toilet?  Yes   TIMED UP AND GO:  Was the test performed? Yes .  Length of time to ambulate 10 feet: 5 sec.   Gait steady and fast without use of assistive device  Cognitive Function:     6CIT Screen 10/16/2019 10/10/2018  What Year? 0 points 0 points  What month? 0 points 0 points  What time? 0 points 0 points  Count back from 20 0 points 0 points  Months in reverse 0 points 0 points  Repeat phrase 2 points 0 points  Total Score 2 0    Immunizations Immunization History  Administered Date(s) Administered  . Fluad Quad(high Dose 65+) 09/20/2018  . Influenza,inj,Quad PF,6+ Mos 10/18/2016, 09/29/2017  . Pneumococcal Polysaccharide-23 03/26/2013    TDAP status: Due, Education has been provided regarding the importance of this vaccine. Advised may receive this vaccine at local pharmacy or Health Dept. Aware to provide a copy of the vaccination record if obtained from local pharmacy or Health Dept. Verbalized acceptance and understanding.   Flu Vaccine status: Completed at today's visit   Pneumococcal vaccine status: Up to date   Covid-19 vaccine status: Declined, Education has been provided regarding the importance of this vaccine but patient still declined. Advised may receive this vaccine at local pharmacy or Health Dept.or vaccine clinic. Aware to provide a copy of the vaccination record if obtained from local pharmacy or Health Dept. Verbalized acceptance and understanding.  Qualifies for Shingles Vaccine? Yes   Zostavax completed No   Shingrix Completed?: No.    Education has been provided regarding the importance of this vaccine. Patient has been advised to call insurance company to determine out of pocket expense if they have not yet received this vaccine. Advised may also receive vaccine at local pharmacy or Health Dept. Verbalized acceptance and understanding.  Screening Tests Health Maintenance  Topic Date Due  . PNA vac Low Risk Adult (2 of 2 - PCV13)  03/27/2014  . OPHTHALMOLOGY EXAM  01/24/2019  . MAMMOGRAM  03/08/2019  . INFLUENZA VACCINE  08/19/2019  . COVID-19 Vaccine (1) 11/01/2019 (Originally 10/31/1957)  . TETANUS/TDAP  03/21/2020 (Originally 10/30/1964)  . HEMOGLOBIN A1C  03/17/2020  . FOOT EXAM  09/16/2020  . COLONOSCOPY  02/09/2027  . DEXA SCAN  Completed  . Hepatitis C Screening  Completed    Health Maintenance  Health Maintenance Due  Topic Date Due  . PNA vac Low Risk Adult (2 of 2 - PCV13) 03/27/2014  . OPHTHALMOLOGY EXAM  01/24/2019  . MAMMOGRAM  03/08/2019  . INFLUENZA VACCINE  08/19/2019    Colorectal cancer screening: Completed 02/08/17. Repeat every 10 years   Mammogram status: Completed 03/07/18. Repeat every year Ordered 03/22/19  Bone Density status: Completed 03/07/18. Results reflect: Bone density results: OSTEOPENIA. Repeat every 2 years.  Lung Cancer Screening: (Low Dose CT Chest recommended if Age 76-80 years, 30 pack-year currently smoking OR have quit w/in 15years.) does not qualify.   Additional Screening:  Hepatitis C Screening: does qualify; Completed 08/23/16  Vision Screening: Recommended annual ophthalmology exams for early detection of glaucoma and other disorders of the eye. Is the patient up to date with their annual eye exam?  No  Who is the provider or what is the name of the office in which the patient attends annual eye exams? [facility] Screening: Recommended annual dental exams for proper oral hygiene  Community Resource Referral / Chronic Care Management: CRR required this visit?  No   CCM required this visit?  No      Plan:     I have personally reviewed and noted the following in the patient's chart:   . Medical and social history . Use of alcohol, tobacco or illicit drugs  . Current medications and supplements . Functional ability and status . Nutritional status . Physical activity . Advanced directives . List of other  physicians . Hospitalizations, surgeries, and ER visits in previous 12 months . Vitals . Screenings to include cognitive, depression, and falls . Referrals and appointments  In addition, I have reviewed and discussed with patient certain preventive protocols, quality metrics, and best practice recommendations. A written personalized care plan for preventive services as well as general preventive health recommendations were provided to patient.     Clemetine Marker, LPN   [id number]   Nurse Notes: pt c/o cough/congestion ongoing for the past 6 months; not taking any OTC meds at this time and did not cough during today's visit. . She also requests refill for nystatin powder for yeast type rash under belly in folds of skin. Originally prescribed by Dr. Carmela Rima. Pt advised to schedule appt with [name] PAC.

## 2019-11-09 ENCOUNTER — Telehealth: Payer: Self-pay | Admitting: *Deleted

## 2019-11-09 NOTE — Chronic Care Management (AMB) (Signed)
  Chronic Care Management   Note  11/09/2019 Name: Tamara [name] MRN: [id number] DOB: Jul 14, 1945  Tamara [name] is a 74 y.o. year old female who is a primary care patient of [name], [place]. I reached out to Talmadge Chad by phone today in response to a referral sent by Tamara [name]'s health plan.     Ms. Pendley was given information about Chronic Care Management services today including:  1. CCM service includes personalized support from designated clinical staff supervised by her physician, including individualized plan of care and coordination with other care providers 2. 24/7 contact phone numbers for assistance for urgent and routine care needs. 3. Service will only be billed when office clinical staff spend 20 minutes or more in a month to coordinate care. 4. Only one practitioner may furnish and bill the service in a calendar month. 5. The patient may stop CCM services at any time (effective at the end of the month) by phone call to the office staff. 6. The patient will be responsible for cost sharing (co-pay) of up to 20% of the service fee (after annual deductible is met).  Patient agreed to services and verbal consent obtained.   Follow up plan: Telephone appointment with care management team member scheduled for:11/28/2019  [place] [facility]

## 2019-11-15 ENCOUNTER — Encounter: Payer: Self-pay | Admitting: Family Medicine

## 2019-11-15 ENCOUNTER — Ambulatory Visit
Admission: RE | Admit: 2019-11-15 | Discharge: 2019-11-15 | Disposition: A | Discharge status: Home / Self Care | Payer: Medicare HMO | Attending: Family Medicine | Admitting: Family Medicine

## 2019-11-15 ENCOUNTER — Other Ambulatory Visit: Payer: Self-pay

## 2019-11-15 ENCOUNTER — Ambulatory Visit (INDEPENDENT_AMBULATORY_CARE_PROVIDER_SITE_OTHER): Payer: Medicare HMO | Admitting: Family Medicine

## 2019-11-15 ENCOUNTER — Ambulatory Visit
Admission: RE | Admit: 2019-11-15 | Discharge: 2019-11-15 | Disposition: A | Discharge status: Home / Self Care | Payer: Medicare HMO | Source: Ambulatory Visit | Attending: Family Medicine | Admitting: Family Medicine

## 2019-11-15 VITALS — BP 138/72 | HR 98 | Temp 98.4°F | Resp 16 | Ht 66.0 in | Wt 229.4 lb

## 2019-11-15 DIAGNOSIS — E079 Disorder of thyroid, unspecified: Secondary | ICD-10-CM

## 2019-11-15 DIAGNOSIS — R0600 Dyspnea, unspecified: Secondary | ICD-10-CM | POA: Insufficient documentation

## 2019-11-15 DIAGNOSIS — B372 Candidiasis of skin and nail: Secondary | ICD-10-CM

## 2019-11-15 DIAGNOSIS — R0989 Other specified symptoms and signs involving the circulatory and respiratory systems: Secondary | ICD-10-CM | POA: Insufficient documentation

## 2019-11-15 DIAGNOSIS — J90 Pleural effusion, not elsewhere classified: Secondary | ICD-10-CM | POA: Diagnosis not present

## 2019-11-15 DIAGNOSIS — R609 Edema, unspecified: Secondary | ICD-10-CM

## 2019-11-15 MED ORDER — FLUCONAZOLE 150 MG PO TABS: 150.0000 mg | ORAL_TABLET | ORAL | 0 refills | Status: DC | PRN

## 2019-11-15 MED ORDER — NYSTATIN 100000 UNIT/GM EX POWD: 1.0000 "application " | Freq: Three times a day (TID) | CUTANEOUS | 0 refills | Status: DC

## 2019-11-15 NOTE — Patient Instructions (Signed)

## 2019-11-15 NOTE — Progress Notes (Signed)
Patient ID: Tamara [name], female    DOB: 1945-08-30, 74 y.o.   MRN: [id number]  PCP: [name], PA-C  Chief Complaint  Patient presents with  . Foot Swelling    and leg swelling for 1 week  . Rash    around abdomen    Subjective:   Tamara [name] is a 74 y.o. female, presents to clinic with CC of the following:  Rash This is a recurrent problem. The current episode started in the past 7 days. The problem has been gradually worsening since onset. The affected locations include the abdomen and groin. The rash is characterized by blistering, burning and itchiness. She was exposed to nothing. Pertinent negatives include no cough, diarrhea, fatigue, fever or shortness of breath.   recurrent rash to skin folds, previously had nystatin powder, reash has returned and she cannot get rid of it  Edema - she reports worsening edema to LE and recently worsening and noted to both hands.   She has cough if she lays flat, but denies orthopnea, DOE, palpitations, CP, doesn't know if she has gained weight but she knows shes holding fluid everywhere and this is worse than previously. She is not on low salt diet Her thyroid meds - she has been taking No urinary changes  She also complains of 2 years of chest congestion and cough that she cannot get rid of, with some dyspnea  Patient Active Problem List   Diagnosis Date Noted  . 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 Anemia of chronic disease 09/05/2017  . Colon polyps 09/05/2017  . Diabetes mellitus type 2 with complications, uncontrolled ([place]) 07/14/2016  . Hypertension 07/14/2016  . Hyperlipemia 07/14/2016  . Bilateral lower extremity edema 07/14/2016  . GERD (gastroesophageal reflux disease) 07/14/2016  . Screening for colorectal cancer 07/14/2016      Current Outpatient Medications:  .  Alcohol Swabs (B-D SINGLE USE SWABS REGULAR) PADS, TEST BLOOD SUGAR TWICE DAILY  DX:E11.65, Disp: 100 each, Rfl: 5 .  Blood Glucose Monitoring Suppl (TRUE METRIX METER) w/Device KIT, 1 applicator by Does not apply route 3 (three) times daily. TID DX:E11.8 LON:99 (Patient taking differently: 1 each by Does not apply route 2 (two) times daily. E11.9), Disp: 1 kit, Rfl: 0 .  glucose blood (TRUE METRIX BLOOD GLUCOSE TEST) test strip, TID DX:E11.8 LON:99 (Patient taking differently: 1 each by Other route 2 (two) times daily. E11.9), Disp: 100 each, Rfl: 12 .  hydrochlorothiazide (HYDRODIURIL) 25 MG tablet, Take 1 tablet (25 mg total) by mouth daily., Disp: 90 tablet, Rfl: 3 .  insulin NPH Human (NOVOLIN N) 100 UNIT/ML injection, Inject 0.6 mLs (60 Units total) into the skin every morning. And syringes 1/day (Patient taking differently: Inject 60 Units into the skin every morning. ), Disp: 10 mL, Rfl: 11 .  levothyroxine (SYNTHROID) 25 MCG tablet, Take 1 tablet (25 mcg total) by mouth daily before breakfast., Disp: 90 tablet, Rfl: 3 .  lisinopril (ZESTRIL) 10 MG tablet, Take 1 tablet (10 mg total) by mouth daily., Disp: 90 tablet, Rfl: 3 .  meloxicam (MOBIC) 7.5 MG tablet, Take 1 tablet (7.5 mg total) by mouth daily., Disp: 90 tablet, Rfl: 3 .  metFORMIN (GLUCOPHAGE) 1000 MG tablet, TAKE 1 TABLET TWICE DAILY WITH MEALS, Disp: 180 tablet, Rfl: 1 .  pantoprazole (PROTONIX) 40 MG tablet, Take 1 tablet (40 mg total) by mouth daily., Disp: 90 tablet, Rfl: 3 .  potassium chloride SA (KLOR-CON) 20 MEQ tablet, TAKE 1 TABLET EVERY DAY, Disp: 90 tablet, Rfl: 1 .  rosuvastatin (CRESTOR) 20 MG tablet, Take 1 tablet (20 mg total) by mouth at bedtime., Disp: 90 tablet, Rfl: 3 .  tiZANidine (ZANAFLEX) 4 MG tablet, TAKE 1 TABLET (4 MG TOTAL) BY MOUTH EVERY 8 (EIGHT) HOURS AS NEEDED FOR MUSCLE SPASMS., Disp: 30 tablet, Rfl: 0 .  TRUEplus Lancets 77O MISC, 1 applicator by Does not apply route 3 (three) times daily. TID DX:E11.8 LON:99 (Patient taking differently: 1 each by Does not apply route 2 (two)  times daily. E11.9), Disp: 100 each, Rfl: 11 .  Insulin Syringe-Needle U-100 (INSULIN SYRINGE 1CC/31GX5/16") 31G X 5/16" 1 ML MISC, 1 Syringe by Does not apply route daily. (Patient not taking: Reported on 11/15/2019), Disp: 90 each, Rfl: 3   Allergies  Allergen Reactions  . Penicillins Hives     Social History   Tobacco Use  . Smoking status: Former Research scientist (life sciences)  . Smokeless tobacco: Never Used  Vaping Use  . Vaping Use: Never used  Substance Use Topics  . Alcohol use: No  . Drug use: No      Chart Review Today: I personally reviewed active problem list, medication list, allergies, family history, social history, health maintenance, notes from last encounter, lab results, imaging with the patient/caregiver today.   Review of Systems  Constitutional: Negative.  Negative for activity change, appetite change, chills, diaphoresis, fatigue, fever and unexpected weight change.  HENT: Negative.   Eyes: Negative.   Respiratory: Negative.  Negative for apnea, cough, choking, chest tightness, shortness of breath, wheezing and stridor.   Cardiovascular: Positive for leg swelling. Negative for chest pain and palpitations.  Gastrointestinal: Negative.  Negative for constipation, diarrhea and nausea.  Endocrine: Negative.  Negative for cold intolerance, heat intolerance, polydipsia, polyphagia and polyuria.  Genitourinary: Negative.  Negative for difficulty urinating.  Musculoskeletal: Negative.   Skin: Positive for rash.  Allergic/Immunologic: Negative.   Neurological: Negative.   Hematological: Negative.   Psychiatric/Behavioral: Negative.   All other systems reviewed and are negative.      Objective:   Vitals:   11/15/19 1445  BP: 138/72  Pulse: 98  Resp: 16  Temp: 98.4 F (36.9 C)  TempSrc: Oral  SpO2: 99%  Weight: 229 lb 6.4 oz (104.1 kg)  Height: $Remove'5\' 6"'vSKiOYY$  (1.676 m)    Body mass index is 37.03 kg/m.  Physical Exam Vitals and nursing note reviewed.  Constitutional:       General: She is not in acute distress.    Appearance: Normal appearance. She is well-developed. She is obese. She is not ill-appearing, toxic-appearing or diaphoretic.     Interventions: Face mask in place.  HENT:     Head: Normocephalic and atraumatic.     Right Ear: External ear normal.     Left Ear: External ear normal.  Eyes:     General: Lids are normal. No scleral icterus.       Right eye: No discharge.        Left eye: No discharge.     Conjunctiva/sclera: Conjunctivae normal.  Neck:     Trachea: Phonation normal. No tracheal deviation.  Cardiovascular:     Rate and Rhythm: Normal rate and regular rhythm.     Pulses: Normal pulses.          Radial pulses are 2+ on the right side and 2+ on the left side.       Posterior tibial pulses are  2+ on the right side and 2+ on the left side.     Heart sounds: Normal heart sounds. No murmur heard.  No friction rub. No gallop.   Pulmonary:     Effort: Pulmonary effort is normal. No respiratory distress.     Breath sounds: Normal breath sounds. No stridor. No wheezing, rhonchi or rales.  Chest:     Chest wall: No tenderness.  Abdominal:     General: Bowel sounds are normal. There is no distension.     Palpations: Abdomen is soft.  Musculoskeletal:     Right lower leg: Edema present.     Left lower leg: Edema present.  Skin:    General: Skin is warm and dry.     Coloration: Skin is not jaundiced or pale.     Findings: No rash.  Neurological:     Mental Status: She is alert.     Motor: No abnormal muscle tone.     Gait: Gait normal.  Psychiatric:        Mood and Affect: Mood normal.        Speech: Speech normal.        Behavior: Behavior normal.      Results for orders placed or performed in visit on 09/17/19  POCT HgB A1C  Result Value Ref Range   Hemoglobin A1C 7.4 (A) 4.0 - 5.6 %   HbA1c POC (<> result, manual entry)     HbA1c, POC (prediabetic range)     HbA1c, POC (controlled diabetic range)         Assessment &  Plan:   1. Chest congestion Mentions today she has chest congestion x 2 years Lungs clear on exam - DG Chest 2 View; Future  2. Dyspnea, unspecified type Complaints of congestion and dyspnea which a large part I suspect is deconditioning - DG Chest 2 View; Future  3. Peripheral edema Noted worsening to b/l LE and then also to hands, but hands have slightly improved, she has evident pedal edema. May be diet? Check kidney/liver/thyroid function, double CHF but screen CXR and BNP  Advised low salt diet - strict May need lasix intermittently - COMPLETE METABOLIC PANEL WITH GFR - TSH - DG Chest 2 View; Future - CBC with Differential/Platelet - Brain natriuretic peptide - furosemide (LASIX) 20 MG tablet; Take 1 tablet (20 mg total) by mouth daily as needed for fluid or edema.  Dispense: 30 tablet; Refill: 1  4. Thyroid disease Taking all meds in the am at the same time, including PPI, discussed proper med administration, recheck TSH with her current sx - TSH - CBC with Differential/Platelet  5. Intertriginous candidiasis Keep skin dry, avoid friction, air out as much as possible Treat with powder and diflucan x 2  - nystatin (MYCOSTATIN/NYSTOP) powder; Apply 1 application topically 3 (three) times daily.  Dispense: 15 g; Refill: 0 - fluconazole (DIFLUCAN) 150 MG tablet; Take 1 tablet (150 mg total) by mouth every 3 (three) days as needed (for vaginal itching/yeast infection sx).  Dispense: 2 tablet; Refill: 0   She was previously on 40 mg lasix daily - HTN has been well controlled off lasix, and she has no past CHF history.  May need lasix again here and there.  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

## 2019-11-16 LAB — COMPLETE METABOLIC PANEL WITH GFR
AG Ratio: 1.7 (calc) (ref 1.0–2.5)
ALT: 22 U/L (ref 6–29)
AST: 31 U/L (ref 10–35)
Albumin: 4.3 g/dL (ref 3.6–5.1)
Alkaline phosphatase (APISO): 52 U/L (ref 37–153)
BUN: 15 mg/dL (ref 7–25)
CO2: 28 mmol/L (ref 20–32)
Calcium: 10.1 mg/dL (ref 8.6–10.4)
Chloride: 102 mmol/L (ref 98–110)
Creat: 0.91 mg/dL (ref 0.60–0.93)
GFR, Est African American: 73 mL/min/{1.73_m2} (ref >=60)
GFR, Est Non African American: 63 mL/min/{1.73_m2} (ref >=60)
Globulin: 2.6 g/dL (calc) (ref 1.9–3.7)
Glucose, Bld: 174 mg/dL — ABNORMAL HIGH (ref 65–99)
Potassium: 4.4 mmol/L (ref 3.5–5.3)
Sodium: 139 mmol/L (ref 135–146)
Total Bilirubin: 0.4 mg/dL (ref 0.2–1.2)
Total Protein: 6.9 g/dL (ref 6.1–8.1)

## 2019-11-16 LAB — CBC WITH DIFFERENTIAL/PLATELET
Absolute Monocytes: 408 cells/uL (ref 200–950)
Basophils Absolute: 18 cells/uL (ref 0–200)
Basophils Relative: 0.3 %
Eosinophils Absolute: 270 cells/uL (ref 15–500)
Eosinophils Relative: 4.5 %
HCT: 33.1 % — ABNORMAL LOW (ref 35.0–45.0)
Hemoglobin: 10.9 g/dL — ABNORMAL LOW (ref 11.7–15.5)
Lymphs Abs: 1440 cells/uL (ref 850–3900)
MCH: 27.9 pg (ref 27.0–33.0)
MCHC: 32.9 g/dL (ref 32.0–36.0)
MCV: 84.7 fL (ref 80.0–100.0)
MPV: 10.7 fL (ref 7.5–12.5)
Monocytes Relative: 6.8 %
Neutro Abs: 3864 cells/uL (ref 1500–7800)
Neutrophils Relative %: 64.4 %
Platelets: 157 10*3/uL (ref 140–400)
RBC: 3.91 10*6/uL (ref 3.80–5.10)
RDW: 13.6 % (ref 11.0–15.0)
Total Lymphocyte: 24 %
WBC: 6 10*3/uL (ref 3.8–10.8)

## 2019-11-16 LAB — TSH: TSH: 2.24 mIU/L (ref 0.40–4.50)

## 2019-11-16 LAB — BRAIN NATRIURETIC PEPTIDE: Brain Natriuretic Peptide: 28 pg/mL (ref <100)

## 2019-11-16 MED ORDER — FUROSEMIDE 20 MG PO TABS: 20.0000 mg | ORAL_TABLET | Freq: Every day | ORAL | 1 refills | Status: DC | PRN

## 2019-11-19 ENCOUNTER — Telehealth: Payer: Self-pay

## 2019-11-19 ENCOUNTER — Other Ambulatory Visit: Payer: Self-pay | Admitting: Emergency Medicine

## 2019-11-19 DIAGNOSIS — B372 Candidiasis of skin and nail: Secondary | ICD-10-CM

## 2019-11-19 MED ORDER — NYSTATIN 100000 UNIT/GM EX POWD: 1.0000 "application " | Freq: Three times a day (TID) | CUTANEOUS | 0 refills | Status: DC

## 2019-11-19 NOTE — Telephone Encounter (Signed)
Order put in for Nystatin powder. Will call when receive CXR  results

## 2019-11-19 NOTE — Telephone Encounter (Signed)
Pt given lab results per notes of [name] PA-C on 11/16/19. Pt verbalized understanding.    Pt also requesting a call back for chest xray results.     Pt also stated that she needs more Nystatin powder- pt stated she is running out and the bottles are small.

## 2019-11-26 NOTE — Chronic Care Management (AMB) (Signed)
Chronic Care Management Pharmacy  Name: ISMAHAN LIPPMAN  MRN: [id number] DOB: 01-23-1945   Chief Complaint/ HPI  Tamara [name],  74 y.o. , female presents for their Initial CCM visit with the clinical pharmacist via telephone.  PCP : [name], PA-C Patient Care Team: [name], PA-C as PCP - General (Family Medicine) Germaine Pomfret, [facility] as Pharmacist (Pharmacist)  Their chronic conditions include: Hypertension, Hyperlipidemia, Diabetes and GERD   Office Visits: 11/15/19: Patient presented to [name], PA-C for chest congestion. Patient started on fluconazole and nystatin for candidiasis. Furosemide PRN started for edema.  10/16/19: Patient presented to Clemetine Marker, LPN for AWV.  05/21/95: Patient presented to [name], PA-C for follow-up. Methocarbamol stopped, tizanidine started.   Consult Visit: 09/17/19: Patient presented to Dr. Loanne Drilling for follow-up. A1c improved to 7.4%.  No medication changes made.  Allergies  Allergen Reactions  . Penicillins Hives    Medications: Outpatient Encounter Medications as of 11/28/2019  Medication Sig  . Ascorbic Acid (VITAMIN C) 1000 MG tablet Take 1,000 mg by mouth daily.  . Cholecalciferol (VITAMIN D) 50 MCG (2000 UT) CAPS Take 2,000 Units by mouth daily.  . hydrochlorothiazide (HYDRODIURIL) 25 MG tablet Take 1 tablet (25 mg total) by mouth daily.  . insulin NPH Human (NOVOLIN N) 100 UNIT/ML injection Inject 0.6 mLs (60 Units total) into the skin every morning. And syringes 1/day (Patient taking differently: Inject 60 Units into the skin every morning. )  . levothyroxine (SYNTHROID) 25 MCG tablet Take 1 tablet (25 mcg total) by mouth daily before breakfast.  . lisinopril (ZESTRIL) 10 MG tablet Take 1 tablet (10 mg total) by mouth daily.  . meloxicam (MOBIC) 7.5 MG tablet Take 1 tablet (7.5 mg total) by mouth daily.  . metFORMIN (GLUCOPHAGE) 1000 MG tablet TAKE 1 TABLET TWICE DAILY WITH MEALS  . nystatin  (MYCOSTATIN/NYSTOP) powder Apply 1 application topically 3 (three) times daily.  . Omega-3 Fatty Acids (FISH OIL) 1000 MG CAPS Take 1 capsule by mouth daily.  . pantoprazole (PROTONIX) 40 MG tablet Take 1 tablet (40 mg total) by mouth daily.  . potassium chloride SA (KLOR-CON) 20 MEQ tablet TAKE 1 TABLET EVERY DAY (Patient taking differently: Take 40 mEq by mouth daily. )  . rosuvastatin (CRESTOR) 20 MG tablet Take 1 tablet (20 mg total) by mouth at bedtime.  Marland Kitchen tiZANidine (ZANAFLEX) 4 MG tablet TAKE 1 TABLET (4 MG TOTAL) BY MOUTH EVERY 8 (EIGHT) HOURS AS NEEDED FOR MUSCLE SPASMS.  Marland Kitchen Alcohol Swabs (B-D SINGLE USE SWABS REGULAR) PADS TEST BLOOD SUGAR TWICE DAILY DX:E11.65  . Blood Glucose Monitoring Suppl (TRUE METRIX METER) w/Device KIT 1 applicator by Does not apply route 3 (three) times daily. TID DX:E11.8 LON:99 (Patient taking differently: 1 each by Does not apply route 2 (two) times daily. E11.9)  . fluconazole (DIFLUCAN) 150 MG tablet Take 1 tablet (150 mg total) by mouth every 3 (three) days as needed (for vaginal itching/yeast infection sx). (Patient not taking: Reported on 11/28/2019)  . furosemide (LASIX) 20 MG tablet Take 1 tablet (20 mg total) by mouth daily as needed for fluid or edema. (Patient not taking: Reported on 11/28/2019)  . glucose blood (TRUE METRIX BLOOD GLUCOSE TEST) test strip TID DX:E11.8 LON:99 (Patient taking differently: 1 each by Other route 2 (two) times daily. E11.9)  . Insulin Syringe-Needle U-100 (INSULIN SYRINGE 1CC/31GX5/16") 31G X 5/16" 1 ML MISC 1 Syringe by Does not apply route daily. (Patient not taking: Reported on 11/15/2019)  .  TRUEplus Lancets 69V MISC 1 applicator by Does not apply route 3 (three) times daily. TID DX:E11.8 LON:99 (Patient taking differently: 1 each by Does not apply route 2 (two) times daily. E11.9)   No facility-administered encounter medications on file as of 11/28/2019.    Wt Readings from Last 3 Encounters:  11/15/19 229 lb 6.4 oz  (104.1 kg)  10/16/19 231 lb 8 oz (105 kg)  09/17/19 231 lb (104.8 kg)    Current Diagnosis/Assessment:  SDOH Interventions     Most Recent Value  SDOH Interventions  Financial Strain Interventions Intervention Not Indicated  Transportation Interventions Intervention Not Indicated      Goals Addressed            This Visit's Progress   . Chronic Care Management       CARE PLAN ENTRY (see longitudinal plan of care for additional care plan information)  Current Barriers:  . Chronic Disease Management support, education, and care coordination needs related to  Hypertension, Hyperlipidemia, Diabetes and GERD    Hypertension BP Readings from Last 3 Encounters:  11/15/19 138/72  10/16/19 128/70  09/17/19 140/70   . Pharmacist Clinical Goal(s): o Over the next 90 days, patient will work with PharmD and providers to achieve BP goal <130/80 . Current regimen:  . HCTZ 25 mg daily  . Lisinopril 10 mg daily . Interventions: o Discussed low salt diet and exercising as tolerated extensively o Will initiate blood pressure monitoring plan  . Patient self care activities - Over the next 90 days, patient will: o Check Blood Pressure Weekly, document, and provide at future appointments o Ensure daily salt intake < 2300 mg/day  Hyperlipidemia Lab Results  Component Value Date/Time   LDLCALC 82 03/22/2019 10:20 AM   . Pharmacist Clinical Goal(s): o Over the next 90 days, patient will work with PharmD and providers to achieve LDL goal < 70 . Current regimen:  o Rosuvastatin 20 mg daily  . Interventions: o Discussed low cholesterol diet and exercising as tolerated extensively o Will initiate cholesterol monitoring plan   Diabetes Lab Results  Component Value Date/Time   HGBA1C 7.4 (A) 09/17/2019 02:10 PM   HGBA1C 9.0 (A) 07/17/2019 01:17 PM   HGBA1C 10.8 (H) 03/22/2019 10:20 AM   HGBA1C 8.2 (H) 11/22/2018 12:00 AM   . Pharmacist Clinical Goal(s): o Over the next 90 days,  patient will work with PharmD and providers to achieve A1c goal <7% . Current regimen:  . Metformin 1000 mg twice daily  . Novolin N 60 units daily  . Interventions: o Discussed carbohydrate counting and exercising as tolerated extensively o Will initiate blood sugar monitoring plan  . Patient self care activities - Over the next 90 days, patient will: o Check blood sugar twice daily, document, and provide at future appointments o Contact provider with any episodes of hypoglycemia  Medication management . Pharmacist Clinical Goal(s): o Over the next 90 days, patient will work with PharmD and providers to maintain optimal medication adherence . Current pharmacy: [facility] . Interventions o Comprehensive medication review performed. o Continue current medication management strategy . Patient self care activities - Over the next 90 days, patient will: o Take medications as prescribed o Report any questions or concerns to PharmD and/or provider(s)      Hypertension   BP goal is:  <130/80  Office blood pressures are  BP Readings from Last 3 Encounters:  11/15/19 138/72  10/16/19 128/70  09/17/19 140/70   Patient checks BP  at home infrequently Patient home BP readings are ranging: n/a  Patient has failed these meds in the past: n/a Patient is currently controlled on the following medications:  . Furosemide 20 mg daily PRN (has not needed)  . HCTZ 25 mg daily  . Lisinopril 10 mg daily   We discussed diet and exercise extensively. Counseled patient to avoid taking an extra pill of HCTZ if she is having edema.   Plan  Continue current medications   Hyperlipidemia   LDL goal < 70  Last lipids Lab Results  Component Value Date   CHOL 152 03/22/2019   HDL 42 (L) 03/22/2019   LDLCALC 82 03/22/2019   TRIG 189 (H) 03/22/2019   CHOLHDL 3.6 03/22/2019   Hepatic Function Latest Ref Rng & Units 11/15/2019 03/22/2019 11/22/2018  Total Protein 6.1 - 8.1 g/dL 6.9 6.7 7.1   Albumin 3.6 - 5.1 g/dL - - -  AST 10 - 35 U/L _0 ALT 6 - 29 U/L _1 Alk Phosphatase 33 - 130 U/L - - -  Total Bilirubin 0.2 - 1.2 mg/dL 0.4 0.4 0.4  Bilirubin, Direct 0.0 - 0.2 mg/dL - - -     The 10-year ASCVD risk score Mikey Bussing DC Jr., et al., 2013) is: 33.8%   Values used to calculate the score:     Age: 26 years     Sex: Female     Is Non-Hispanic African American: No     Diabetic: Yes     Tobacco smoker: No     Systolic Blood Pressure: 248 mmHg     Is BP treated: Yes     HDL Cholesterol: 42 mg/dL     Total Cholesterol: 152 mg/dL   Patient has failed these meds in past: n/a Patient is currently uncontrolled on the following medications:  . Rosuvastatin 20 mg QHS  We discussed:  diet and exercise extensively  Plan  Continue current medications  Diabetes   Managed by Dr. Loanne Drilling  A1c goal <7%  Recent Relevant Labs: Lab Results  Component Value Date/Time   HGBA1C 7.4 (A) 09/17/2019 02:10 PM   HGBA1C 9.0 (A) 07/17/2019 01:17 PM   HGBA1C 10.8 (H) 03/22/2019 10:20 AM   HGBA1C 8.2 (H) 11/22/2018 12:00 AM   MICROALBUR 0.5 09/05/2017 08:17 AM   MICROALBUR 0.2 08/23/2016 08:25 AM    Last diabetic Eye exam:  Lab Results  Component Value Date/Time   HMDIABEYEEXA No Retinopathy 12/29/2017 12:00 AM    Last diabetic Foot exam: No results found for: HMDIABFOOTEX   Checking BG: 2x per Day   Breakfast Lunch Dinner Bedtime  10-Nov 121     9-Nov 131  153   8-Nov 137     7-Nov 107     6-Nov 98  155   5-Nov 128  127 139  Average 120  145 139   Patient has failed these meds in past: n/a Patient is currently controlled on the following medications: Marland Kitchen Metformin 1000 mg twice daily  . Novolin N 60 units daily   We discussed: diet and exercise extensively  Plan  Continue current medications  Hypothyroidism   Lab Results  Component Value Date/Time   TSH 2.24 11/15/2019 03:23 PM   TSH 2.45 03/22/2019 10:20 AM   FREET4 1.2 11/22/2018 12:00 AM     Patient has failed these meds in past: n/a Patient is currently controlled on the following medications:  . Levothyroxine 25 mcg daily   We discussed: Was  missing doses which had worsened her swelling, but patient has gotten back into a routine and has not missed any doses in the past 2 weeks.   Plan  Continue current medications   GERD   Patient denies dysphagia, heartburn or nausea. Expresses understanding to avoid triggers such as citrus juices, large meals and lying down after eating.  Currently controlled on: . Pantoprazole 40 mg daily   Plan   Continue current medication.  Osteopenia / Osteoporosis   Last DEXA Scan: 03/07/18   T-Score femoral neck: -1.5  T-Score total hip: -1.5  T-Score lumbar spine: +0.5  T-Score forearm radius: n/a  10-year probability of major osteoporotic fracture: 9.6%  10-year probability of hip fracture: 1.4%  No results found for: VD25OH   Patient is not a candidate for pharmacologic treatment  Patient has failed these meds in past: n/a Patient is currently controlled on the following medications:  Marland Kitchen Vitamin D 2000 units daily  We discussed:  Recommend [phone] units of vitamin D daily. Recommend 1200 mg of calcium daily from dietary and supplemental sources. Counseled patient to separate calcium from levothyroxine by at least 4 hours.   Plan  Recommend starting calcium 500 mg with dinner.   Misc / OTC   . Meloxicam 7.5 mg daily PRN (Used once in past week)  . Potassium 20 meQ daily (taking 2 daily)  . Tizanidine 4 mg q8hr PRN - Restless Legs  . Nystatin ( Rash has not gone away). Wants to know if there is a larger bottle she can get. . Vitamin C 1000 mg daily  . Fish Oil 1000 mg daily   Plan  Continue current medications  Medication Management   Pt uses [facility] for all medications Uses pill box? No - Keeps all her medications on her dresser by her bedroom Pt endorses 70% compliance  We discussed:  Current pharmacy is preferred with insurance plan and patient is satisfied with pharmacy services  Plan  Continue current medication management strategy  Follow up: 6 month phone visit  [place] [phone]

## 2019-11-28 ENCOUNTER — Ambulatory Visit: Payer: Medicare HMO

## 2019-11-28 ENCOUNTER — Other Ambulatory Visit: Payer: Self-pay | Admitting: Family Medicine

## 2019-11-28 DIAGNOSIS — I10 Essential (primary) hypertension: Secondary | ICD-10-CM

## 2019-11-28 DIAGNOSIS — E1165 Type 2 diabetes mellitus with hyperglycemia: Secondary | ICD-10-CM

## 2019-11-28 DIAGNOSIS — B372 Candidiasis of skin and nail: Secondary | ICD-10-CM

## 2019-11-28 DIAGNOSIS — IMO0002 Reserved for concepts with insufficient information to code with codable children: Secondary | ICD-10-CM

## 2019-11-28 MED ORDER — NYSTATIN 100000 UNIT/GM EX POWD: 1.0000 "application " | Freq: Three times a day (TID) | CUTANEOUS | 2 refills | Status: DC | PRN

## 2019-11-28 NOTE — Progress Notes (Signed)
Pt just came in with CC of 2 years of coughing and congestion and its all new, so she will need to f/up Once I recheck her I can determine next steps - IE cardiac work up? ENT? Pulm? She is still not really trying the recommended tx if she is already not taking lasix.  I sent in more nystatin powder - other management is geared towards avoiding moisture and friction - it will often reoccur - she has diflucan for when its severe and won't improve.  She can also get cheaper OTC miconazole powder  [name], PA-C

## 2019-11-29 DIAGNOSIS — H524 Presbyopia: Secondary | ICD-10-CM | POA: Diagnosis not present

## 2019-12-07 ENCOUNTER — Telehealth: Payer: Self-pay | Admitting: Endocrinology

## 2019-12-07 NOTE — Telephone Encounter (Signed)
Patient requests to change her insulin from vial to Novolin N Pens. Patient requests new JW'B for Novolin N Pens AND 42mm x 32 gauge Needles be sent to:  [place], [place] Phone:  [phone]  Fax:  [phone]

## 2019-12-10 MED ORDER — NOVOLIN N FLEXPEN 100 UNIT/ML ~~LOC~~ SUPN: 60.0000 [IU] | PEN_INJECTOR | SUBCUTANEOUS | 3 refills | Status: DC

## 2019-12-10 NOTE — Addendum Note (Signed)
Addended by: Renato Shin on: 12/10/2019 10:43 AM   Modules accepted: Orders

## 2019-12-10 NOTE — Telephone Encounter (Signed)
OK, I have sent a prescription to your pharmacy.  

## 2019-12-11 DIAGNOSIS — Z0100 Encounter for examination of eyes and vision without abnormal findings: Secondary | ICD-10-CM | POA: Diagnosis not present

## 2019-12-12 ENCOUNTER — Other Ambulatory Visit: Payer: Self-pay | Admitting: Family Medicine

## 2019-12-12 DIAGNOSIS — E119 Type 2 diabetes mellitus without complications: Secondary | ICD-10-CM

## 2019-12-15 ENCOUNTER — Other Ambulatory Visit: Payer: Self-pay | Admitting: Family Medicine

## 2019-12-19 ENCOUNTER — Ambulatory Visit (INDEPENDENT_AMBULATORY_CARE_PROVIDER_SITE_OTHER): Payer: Medicare HMO | Admitting: Endocrinology

## 2019-12-19 ENCOUNTER — Encounter: Payer: Self-pay | Admitting: Endocrinology

## 2019-12-19 ENCOUNTER — Other Ambulatory Visit: Payer: Self-pay

## 2019-12-19 VITALS — BP 128/72 | HR 62 | Ht 66.0 in | Wt 230.0 lb

## 2019-12-19 DIAGNOSIS — E118 Type 2 diabetes mellitus with unspecified complications: Secondary | ICD-10-CM

## 2019-12-19 DIAGNOSIS — E1165 Type 2 diabetes mellitus with hyperglycemia: Secondary | ICD-10-CM

## 2019-12-19 DIAGNOSIS — IMO0002 Reserved for concepts with insufficient information to code with codable children: Secondary | ICD-10-CM

## 2019-12-19 LAB — POCT GLYCOSYLATED HEMOGLOBIN (HGB A1C): Hemoglobin A1C: 8 % — AB (ref 4.0–5.6)

## 2019-12-19 MED ORDER — NOVOLIN N FLEXPEN 100 UNIT/ML ~~LOC~~ SUPN
64.0000 [IU] | PEN_INJECTOR | SUBCUTANEOUS | 3 refills | Status: DC
Start: 2019-12-19 — End: 2020-05-19

## 2019-12-19 NOTE — Progress Notes (Signed)
Subjective:    Patient ID: Tamara [name], female    DOB: May 27, 1945, 74 y.o.   MRN: [id number]  HPI Pt returns for f/u of diabetes mellitus: DM type: Insulin-requiring type 2 Dx'ed: 3546 Complications: none Therapy: insulin since 2021, and metformin.   GDM: never DKA: never Severe hypoglycemia: never Pancreatitis: never Pancreatic imaging: never SDOH: she cannot afford brand name meds.  Other: she also took insulin 2017-2018; she removed the need for insulin by losing 40 lbs; she declines weight loss surgery; she declines multiple daily injections.   Interval history: no cbg record, but states cbg's vary from 99-200.  It is in general higher as the day goes on.  She takes insulin as rx'ed.   Past Medical History:  Diagnosis Date  . Diabetes mellitus without complication ([place])   . Gastritis 09/05/2017  . GERD (gastroesophageal reflux disease)   . Hyperlipidemia   . Hypertension   . RUQ pain 03/09/2017   Per patient, she has had RUQ pain 4-5 years that has grown worse in the last few months.    Past Surgical History:  Procedure Laterality Date  . BREAST BIOPSY Left 2008   CORE W/CLIP - NEG  . COLONOSCOPY WITH PROPOFOL N/A 02/08/2017   Procedure: COLONOSCOPY WITH PROPOFOL;  Surgeon: Lollie Sails, MD;  Location[facility] ENDOSCOPY;  Service: Endoscopy;  Laterality: N/A;  . ESOPHAGOGASTRODUODENOSCOPY (EGD) WITH PROPOFOL N/A 02/08/2017   Procedure: ESOPHAGOGASTRODUODENOSCOPY (EGD) WITH PROPOFOL;  Surgeon: Lollie Sails, MD;  Location: [facility] ENDOSCOPY;  Service: Endoscopy;  Laterality: N/A;  . TUBAL LIGATION      Social History   Socioeconomic History  . Marital status: Married    Spouse name: Not on file  . Number of children: 3  . Years of education: Not on file  . Highest education level: High school graduate  Occupational History  . Occupation: retired  Tobacco Use  . Smoking status: Former Research scientist (life sciences)  . Smokeless tobacco: Never Used  Vaping Use  . Vaping Use:  Never used  Substance and Sexual Activity  . Alcohol use: No  . Drug use: No  . Sexual activity: Not on file  Other Topics Concern  . Not on file  Social History Narrative  . Not on file   Social Determinants of Health   Financial Resource Strain: Low Risk   . Difficulty of Paying Living Expenses: Not hard at all  Food Insecurity: No Food Insecurity  . Worried About Charity fundraiser in the Last Year: Never true  . Ran Out of Food in the Last Year: Never true  Transportation Needs: No Transportation Needs  . Lack of Transportation (Medical): No  . Lack of Transportation (Non-Medical): No  Physical Activity: Inactive  . Days of Exercise per Week: 0 days  . Minutes of Exercise per Session: 0 min  Stress: No Stress Concern Present  . Feeling of Stress : Not at all  Social Connections: Moderately Isolated  . Frequency of Communication with Friends and Family: More than three times a week  . Frequency of Social Gatherings with Friends and Family: More than three times a week  . Attends Religious Services: Never  . Active Member of Clubs or Organizations: No  . Attends Archivist Meetings: Never  . Marital Status: Married  Human resources officer Violence: Not At Risk  . Fear of Current or Ex-Partner: No  . Emotionally Abused: No  . Physically Abused: No  . Sexually Abused: No    Current  Outpatient Medications on File Prior to Visit  Medication Sig Dispense Refill  . Alcohol Swabs (B-D SINGLE USE SWABS REGULAR) PADS TEST BLOOD SUGAR TWICE DAILY DX:E11.65 100 each 5  . Ascorbic Acid (VITAMIN C) 1000 MG tablet Take 1,000 mg by mouth daily.    . Blood Glucose Monitoring Suppl (TRUE METRIX METER) w/Device KIT USE AS DIRECTED 1 kit 0  . Cholecalciferol (VITAMIN D) 50 MCG (2000 UT) CAPS Take 2,000 Units by mouth daily.    . fluconazole (DIFLUCAN) 150 MG tablet Take 1 tablet (150 mg total) by mouth every 3 (three) days as needed (for vaginal itching/yeast infection sx). 2 tablet  0  . furosemide (LASIX) 20 MG tablet Take 1 tablet (20 mg total) by mouth daily as needed for fluid or edema. 30 tablet 1  . glucose blood (TRUE METRIX BLOOD GLUCOSE TEST) test strip TID DX:E11.8 LON:99 (Patient taking differently: 1 each by Other route 2 (two) times daily. E11.9) 100 each 12  . hydrochlorothiazide (HYDRODIURIL) 25 MG tablet Take 1 tablet (25 mg total) by mouth daily. 90 tablet 3  . levothyroxine (SYNTHROID) 25 MCG tablet Take 1 tablet (25 mcg total) by mouth daily before breakfast. 90 tablet 3  . lisinopril (ZESTRIL) 10 MG tablet Take 1 tablet (10 mg total) by mouth daily. 90 tablet 3  . meloxicam (MOBIC) 7.5 MG tablet Take 1 tablet (7.5 mg total) by mouth daily. 90 tablet 3  . metFORMIN (GLUCOPHAGE) 1000 MG tablet TAKE 1 TABLET TWICE DAILY WITH MEALS 180 tablet 1  . nystatin (MYCOSTATIN/NYSTOP) powder Apply 1 application topically 3 (three) times daily as needed. 60 g 2  . Omega-3 Fatty Acids (FISH OIL) 1000 MG CAPS Take 1 capsule by mouth daily.    . pantoprazole (PROTONIX) 40 MG tablet Take 1 tablet (40 mg total) by mouth daily. 90 tablet 3  . potassium chloride SA (KLOR-CON) 20 MEQ tablet TAKE 1 TABLET EVERY DAY (Patient taking differently: Take 40 mEq by mouth daily. ) 90 tablet 1  . rosuvastatin (CRESTOR) 20 MG tablet Take 1 tablet (20 mg total) by mouth at bedtime. 90 tablet 3  . tiZANidine (ZANAFLEX) 4 MG tablet TAKE 1 TABLET (4 MG TOTAL) BY MOUTH EVERY 8 (EIGHT) HOURS AS NEEDED FOR MUSCLE SPASMS. 30 tablet 0  . TRUEplus Lancets 84X MISC 1 applicator by Does not apply route 3 (three) times daily. TID DX:E11.8 LON:99 (Patient taking differently: 1 each by Does not apply route 2 (two) times daily. E11.9) 100 each 11   No current facility-administered medications on file prior to visit.    Allergies  Allergen Reactions  . Penicillins Hives    Family History  Problem Relation Age of Onset  . Diabetes Maternal Grandmother   . Breast cancer Neg Hx     BP 128/72    Pulse 62   Ht _0  (1.676 m)   Wt 230 lb (104.3 kg)   SpO2 98%   BMI 37.12 kg/m    Review of Systems She denies hypoglycemia    Objective:   Physical Exam VITAL SIGNS:  See vs page GENERAL: no distress Pulses: dorsalis pedis intact bilat.   MSK: no deformity of the feet CV: no leg edema.   Skin:  no ulcer on the feet.  normal color and temp on the feet. Neuro: sensation is intact to touch on the feet  Lab Results  Component Value Date   HGBA1C 8.0 (A) 12/19/2019        Assessment &  Plan:  Insulin-requiring type 2 DM: uncontrolled  Patient Instructions  check your blood sugar once a day.  vary the time of day when you check, between before the 3 meals, and at bedtime.  also check if you have symptoms of your blood sugar being too high or too low.  please keep a record of the readings and bring it to your next appointment here (or you can bring the meter itself).  You can write it on any piece of paper.  please call us sooner if your blood sugar goes below 70, or if you have a lot of readings over 200. I have sent a prescription to your pharmacy, to increase the insulin to 64 units each morning, and: Please continue the same metformin  On this type of insulin schedule, you should eat meals on a regular schedule.  If a meal is missed or significantly delayed, your blood sugar could go low.   Please come back for a follow-up appointment in 3 months.

## 2019-12-19 NOTE — Patient Instructions (Addendum)
check your blood sugar once a day.  vary the time of day when you check, between before the 3 meals, and at bedtime.  also check if you have symptoms of your blood sugar being too high or too low.  please keep a record of the readings and bring it to your next appointment here (or you can bring the meter itself).  You can write it on any piece of paper.  please call us sooner if your blood sugar goes below 70, or if you have a lot of readings over 200. I have sent a prescription to your pharmacy, to increase the insulin to 64 units each morning, and: Please continue the same metformin  On this type of insulin schedule, you should eat meals on a regular schedule.  If a meal is missed or significantly delayed, your blood sugar could go low.   Please come back for a follow-up appointment in 3 months.

## 2020-01-10 ENCOUNTER — Other Ambulatory Visit: Payer: Self-pay | Admitting: Family Medicine

## 2020-01-18 ENCOUNTER — Other Ambulatory Visit: Payer: Self-pay | Admitting: Family Medicine

## 2020-01-18 DIAGNOSIS — E079 Disorder of thyroid, unspecified: Secondary | ICD-10-CM

## 2020-01-24 ENCOUNTER — Ambulatory Visit: Payer: Medicare HMO | Admitting: Family Medicine

## 2020-01-28 ENCOUNTER — Encounter: Payer: Self-pay | Admitting: Family Medicine

## 2020-01-28 ENCOUNTER — Ambulatory Visit (INDEPENDENT_AMBULATORY_CARE_PROVIDER_SITE_OTHER): Payer: Medicare HMO | Admitting: Family Medicine

## 2020-01-28 ENCOUNTER — Other Ambulatory Visit: Payer: Self-pay

## 2020-01-28 VITALS — BP 132/76 | HR 98 | Temp 98.3°F | Resp 16 | Ht 66.0 in | Wt 238.1 lb

## 2020-01-28 DIAGNOSIS — IMO0002 Reserved for concepts with insufficient information to code with codable children: Secondary | ICD-10-CM

## 2020-01-28 DIAGNOSIS — I10 Essential (primary) hypertension: Secondary | ICD-10-CM

## 2020-01-28 DIAGNOSIS — E1165 Type 2 diabetes mellitus with hyperglycemia: Secondary | ICD-10-CM

## 2020-01-28 DIAGNOSIS — R0989 Other specified symptoms and signs involving the circulatory and respiratory systems: Secondary | ICD-10-CM

## 2020-01-28 DIAGNOSIS — E785 Hyperlipidemia, unspecified: Secondary | ICD-10-CM | POA: Diagnosis not present

## 2020-01-28 DIAGNOSIS — E079 Disorder of thyroid, unspecified: Secondary | ICD-10-CM

## 2020-01-28 DIAGNOSIS — E118 Type 2 diabetes mellitus with unspecified complications: Secondary | ICD-10-CM | POA: Diagnosis not present

## 2020-01-28 DIAGNOSIS — B372 Candidiasis of skin and nail: Secondary | ICD-10-CM

## 2020-01-28 DIAGNOSIS — R0600 Dyspnea, unspecified: Secondary | ICD-10-CM

## 2020-01-28 DIAGNOSIS — J310 Chronic rhinitis: Secondary | ICD-10-CM

## 2020-01-28 DIAGNOSIS — M545 Low back pain, unspecified: Secondary | ICD-10-CM | POA: Diagnosis not present

## 2020-01-28 DIAGNOSIS — R600 Localized edema: Secondary | ICD-10-CM

## 2020-01-28 DIAGNOSIS — J449 Chronic obstructive pulmonary disease, unspecified: Secondary | ICD-10-CM

## 2020-01-28 DIAGNOSIS — D649 Anemia, unspecified: Secondary | ICD-10-CM

## 2020-01-28 DIAGNOSIS — Z1231 Encounter for screening mammogram for malignant neoplasm of breast: Secondary | ICD-10-CM

## 2020-01-28 DIAGNOSIS — Z6838 Body mass index (BMI) 38.0-38.9, adult: Secondary | ICD-10-CM

## 2020-01-28 MED ORDER — POTASSIUM CHLORIDE CRYS ER 20 MEQ PO TBCR: 20.0000 meq | EXTENDED_RELEASE_TABLET | Freq: Every day | ORAL | 1 refills | Status: DC

## 2020-01-28 MED ORDER — TRELEGY ELLIPTA 100-62.5-25 MCG/INH IN AEPB: 1.0000 | INHALATION_SPRAY | Freq: Every day | RESPIRATORY_TRACT | 11 refills | Status: DC

## 2020-01-28 MED ORDER — FLUCONAZOLE 100 MG PO TABS: ORAL_TABLET | ORAL | 0 refills | Status: DC

## 2020-01-28 MED ORDER — NYSTATIN 100000 UNIT/GM EX POWD: 1.0000 "application " | Freq: Three times a day (TID) | CUTANEOUS | 2 refills | Status: DC | PRN

## 2020-01-28 NOTE — Progress Notes (Signed)
Name: Tamara [name]   MRN: [id number]    DOB: Feb 14, 1945   Date:01/28/2020       Progress Note  Chief Complaint  Patient presents with  . Diabetes  . Hypothyroidism  . Hyperlipidemia  . Hypertension     Subjective:   BRYNLEIGH [name] is a 75 y.o. female, presents to clinic for routine f/up   Hypertension:  Currently managed on hctz and lisinopril Pt reports good med compliance and denies any SE.   Blood pressure today is well controlled. BP Readings from Last 3 Encounters:  01/28/20 132/76  12/19/19 128/72  11/15/19 138/72   Pt denies CP, exertional sx, LE edema, palpitation, Ha's, visual disturbances, lightheadedness, hypotension, syncope.  DM- uncontrolled, last A1C 12/19/2019, 8.0 - done at endocrinology office - managed by Dr. Loanne Drilling - 64 units insulin She is UTD on foot exam, on statin and ACEI, did eye exam about a month ago - requesting records  Hypothyroid- today she states she had been forgetting to take levothyroxine, on 25 mcg dose Lab Results  Component Value Date   TSH 2.24 11/15/2019   LE edema - on lasix 20 mg and potassium prn - was on in the past, restarted recently, taking rarely Better with improved levothyroxine compliance, prior OV visit dedicated to worsening LE edema and cough/chest congestion and DOE - BNP normal, CXR unremarkable  Anemia, unspecified Hemoglobin  Date Value Ref Range Status  11/15/2019 10.9 (L) 11.7 - 15.5 g/dL Final  03/22/2019 11.0 (L) 11.7 - 15.5 g/dL Final  11/22/2018 11.6 (L) 11.7 - 15.5 g/dL Final  04/11/2018 11.4 (L) 11.7 - 15.5 g/dL Final   Lab Results  Component Value Date   IRON 80 05/26/2017   TIBC 345 05/26/2017   FERRITIN 34 05/26/2017   GERD:  On protonix 40 mg daily - well controlled no sx  Due for mammogram since 02/2018   Due for DM eye exam- stated done in [place] [facility]   Hyperlipidemia: Currently treated with crestor 20 mg, pt reports good med compliance Last Lipids: Lab  Results  Component Value Date   CHOL 152 03/22/2019   HDL 42 (L) 03/22/2019   LDLCALC 82 03/22/2019   TRIG 189 (H) 03/22/2019   CHOLHDL 3.6 03/22/2019   - Denies: Chest pain, myalgias, claudication  Edema better with taking levothyroxine more regularly, she was forgetting, using lasix only a few times a month  Cough, congestion, SOB for over 2 years, CXR was normal, no past asthma, COPD or pneumonia, feels like infection a few years ago was COVID and "damaged lungs" She has nasal sx, not on any meds Reports 45 year smoking hx, quit 15 years ago, denies recurrent bronchitis hx   Recurrent skin infections - tried nystatin powder and diflucan, still returns, asks for something different     Current Outpatient Medications:  .  Ascorbic Acid (VITAMIN C) 1000 MG tablet, Take 1,000 mg by mouth daily., Disp: , Rfl:  .  Cholecalciferol (VITAMIN D) 50 MCG (2000 UT) CAPS, Take 2,000 Units by mouth daily., Disp: , Rfl:  .  furosemide (LASIX) 20 MG tablet, Take 1 tablet (20 mg total) by mouth daily as needed for fluid or edema., Disp: 30 tablet, Rfl: 1 .  hydrochlorothiazide (HYDRODIURIL) 25 MG tablet, Take 1 tablet (25 mg total) by mouth daily., Disp: 90 tablet, Rfl: 3 .  Insulin NPH, Human,, Isophane, (NOVOLIN N FLEXPEN) 100 UNIT/ML Kiwkpen, Inject 64 Units into the skin every morning. And pen needles (  62m x 32G) 1/day, Disp: 60 mL, Rfl: 3 .  levothyroxine (SYNTHROID) 25 MCG tablet, TAKE 1 TABLET (25 MCG TOTAL) BY MOUTH DAILY BEFORE BREAKFAST. (DOSE CHANGE), Disp: 90 tablet, Rfl: 3 .  lisinopril (ZESTRIL) 10 MG tablet, Take 1 tablet (10 mg total) by mouth daily., Disp: 90 tablet, Rfl: 3 .  meloxicam (MOBIC) 7.5 MG tablet, Take 1 tablet (7.5 mg total) by mouth daily., Disp: 90 tablet, Rfl: 3 .  metFORMIN (GLUCOPHAGE) 1000 MG tablet, TAKE 1 TABLET TWICE DAILY WITH MEALS, Disp: 180 tablet, Rfl: 1 .  nystatin (MYCOSTATIN/NYSTOP) powder, Apply 1 application topically 3 (three) times daily as needed.,  Disp: 60 g, Rfl: 2 .  Omega-3 Fatty Acids (FISH OIL) 1000 MG CAPS, Take 1 capsule by mouth daily., Disp: , Rfl:  .  pantoprazole (PROTONIX) 40 MG tablet, Take 1 tablet (40 mg total) by mouth daily., Disp: 90 tablet, Rfl: 3 .  potassium chloride SA (KLOR-CON) 20 MEQ tablet, TAKE 1 TABLET EVERY DAY (Patient taking differently: Take 40 mEq by mouth daily.), Disp: 90 tablet, Rfl: 1 .  rosuvastatin (CRESTOR) 20 MG tablet, Take 1 tablet (20 mg total) by mouth at bedtime., Disp: 90 tablet, Rfl: 3 .  tiZANidine (ZANAFLEX) 4 MG tablet, TAKE 1 TABLET (4 MG TOTAL) BY MOUTH EVERY 8 (EIGHT) HOURS AS NEEDED FOR MUSCLE SPASMS., Disp: 30 tablet, Rfl: 0 .  Accu-Chek Softclix Lancets lancets, TEST BLOOD SUGAR TWICE DAILY. (Patient not taking: Reported on 01/28/2020), Disp: 200 each, Rfl: 11 .  Alcohol Swabs (B-D SINGLE USE SWABS REGULAR) PADS, TEST BLOOD SUGAR TWICE DAILY DX:E11.65 (Patient not taking: Reported on 01/28/2020), Disp: 100 each, Rfl: 5 .  Blood Glucose Monitoring Suppl (TRUE METRIX METER) w/Device KIT, USE AS DIRECTED (Patient not taking: Reported on 01/28/2020), Disp: 1 kit, Rfl: 0 .  fluconazole (DIFLUCAN) 150 MG tablet, Take 1 tablet (150 mg total) by mouth every 3 (three) days as needed (for vaginal itching/yeast infection sx). (Patient not taking: Reported on 01/28/2020), Disp: 2 tablet, Rfl: 0 .  glucose blood (TRUE METRIX BLOOD GLUCOSE TEST) test strip, TID DX:E11.8 LON:99 (Patient not taking: Reported on 01/28/2020), Disp: 100 each, Rfl: 12  Patient Active Problem List   Diagnosis Date Noted  . 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 Anemia of chronic disease 09/05/2017  . Colon polyps 09/05/2017  . Diabetes mellitus type [place] with complications, uncontrolled [place] 07/14/2016  . Hypertension 07/14/2016  . Hyperlipemia 07/14/2016  . Bilateral lower extremity edema 07/14/2016  . GERD (gastroesophageal reflux disease) 07/14/2016  . Screening for  colorectal cancer 07/14/2016    Past Surgical History:  Procedure Laterality Date  . BREAST BIOPSY Left 2008   CORE W/CLIP - NEG  . COLONOSCOPY WITH PROPOFOL N/A 02/08/2017   Procedure: COLONOSCOPY WITH PROPOFOL;  Surgeon: SLollie Sails MD;  [facility];  Service: Endoscopy;  Laterality: N/A;  . ESOPHAGOGASTRODUODENOSCOPY (EGD) WITH PROPOFOL N/A 02/08/2017   Procedure: ESOPHAGOGASTRODUODENOSCOPY (EGD) WITH PROPOFOL;  Surgeon: SLollie Sails MD;  [facility]  Service: End[facility];  Laterality: N/A;  . TUBAL LIGATION      Family History  Problem Relation Age of Onset  . Diabetes Maternal Grandmother   . Breast cancer Neg Hx     Social History   Tobacco Use  . Smoking status: Former SResearch scientist (life sciences) . Smokeless tobacco: Never Used  Vaping Use  . Vaping Use: Never used  Substance Use Topics  .  Alcohol use: No  . Drug use: No     Allergies  Allergen Reactions  . Penicillins Hives    Health Maintenance  Topic Date Due  . OPHTHALMOLOGY EXAM  01/24/2019  . MAMMOGRAM  03/08/2019  . COVID-19 Vaccine (1) 02/13/2020 (Originally 10/31/1957)  . TETANUS/TDAP  03/21/2020 (Originally 10/30/1965)  . HEMOGLOBIN A1C  06/18/2020  . FOOT EXAM  12/18/2020  . COLONOSCOPY (Pts 45-1yr Insurance coverage will need to be confirmed)  02/09/2027  . INFLUENZA VACCINE  Completed  . DEXA SCAN  Completed  . Hepatitis C Screening  Completed  . PNA vac Low Risk Adult  Completed    Chart Review Today: I personally reviewed active problem list, medication list, allergies, family history, social history, health maintenance, notes from last encounter, lab results, imaging with the patient/caregiver today.   Review of Systems  Constitutional: Negative.   HENT: Negative.   Eyes: Negative.   Respiratory: Negative.   Cardiovascular: Negative.   Gastrointestinal: Negative.   Endocrine: Negative.   Genitourinary: Negative.   Musculoskeletal: Negative.   Skin: Negative.    Allergic/Immunologic: Negative.   Neurological: Negative.   Hematological: Negative.   Psychiatric/Behavioral: Negative.   All other systems reviewed and are negative.  neg except noted in HPI  Objective:   Vitals:   01/28/20 1008  BP: 132/76  Pulse: 98  Resp: 16  Temp: 98.3 F (36.8 C)  TempSrc: Oral  SpO2: 99%  Weight: 238 lb 1.6 oz (108 kg)  Height: _0  (1.676 m)    Body mass index is 38.43 kg/m.  Physical Exam Vitals and nursing note reviewed.  Constitutional:      General: She is not in acute distress.    Appearance: Normal appearance. She is well-developed and well-nourished. She is obese. She is not ill-appearing, toxic-appearing, sickly-appearing or diaphoretic.     Interventions: Face mask in place.  HENT:     Head: Normocephalic and atraumatic.     Right Ear: Hearing, tympanic membrane, ear canal and external ear normal.     Left Ear: Hearing, tympanic membrane, ear canal and external ear normal.     Nose: Mucosal edema, congestion and rhinorrhea present.     Right Sinus: No maxillary sinus tenderness or frontal sinus tenderness.     Left Sinus: No maxillary sinus tenderness or frontal sinus tenderness.     Mouth/Throat:     Mouth: Mucous membranes are normal. Mucous membranes are moist. Mucous membranes are not pale.     Pharynx: Oropharynx is clear. Uvula midline. No oropharyngeal exudate, posterior oropharyngeal erythema or uvula swelling.     Tonsils: No tonsillar abscesses.  Eyes:     General: Lids are normal. No scleral icterus.       Right eye: No discharge.        Left eye: No discharge.     Conjunctiva/sclera: Conjunctivae normal.     Pupils: Pupils are equal, round, and reactive to light.  Neck:     Trachea: Phonation normal. No tracheal deviation.  Cardiovascular:     Rate and Rhythm: Normal rate and regular rhythm.     Pulses: Normal pulses and intact distal pulses.          Radial pulses are 2+ on the right side and 2+ on the left side.        Posterior tibial pulses are 2+ on the right side and 2+ on the left side.     Heart sounds: Normal heart sounds. No murmur heard.  No friction rub. No gallop.   Pulmonary:     Effort: Pulmonary effort is normal. No respiratory distress.     Breath sounds: Normal breath sounds. No stridor. No wheezing, rhonchi or rales.  Chest:     Chest wall: No tenderness.  Abdominal:     General: Bowel sounds are normal. There is no distension.     Palpations: Abdomen is soft.  Musculoskeletal:        General: Normal range of motion.     Cervical back: Normal range of motion and neck supple.     Right lower leg: No edema.     Left lower leg: No edema.  Skin:    General: Skin is warm and dry.     Coloration: Skin is not jaundiced or pale.     Findings: No rash.  Neurological:     Mental Status: She is alert.     Motor: No abnormal muscle tone.     Coordination: Coordination normal.     Gait: Gait normal.  Psychiatric:        Mood and Affect: Mood and affect and mood normal.        Speech: Speech normal.        Behavior: Behavior normal.         Assessment & Plan:     ICD-10-CM   1. Primary hypertension  E31 COMPLETE METABOLIC PANEL WITH GFR   Stable, well controlled, blood pressure at goal today  2. Diabetes mellitus type 2 with complications, uncontrolled (HCC) Chronic V40.0 COMPLETE METABOLIC PANEL WITH GFR   E11.65    uncontrolled - per endocrinology  3. Hyperlipidemia, unspecified hyperlipidemia type  Q67.6 COMPLETE METABOLIC PANEL WITH GFR    Lipid panel   compliant with statin, tolerating, no SE or concerns  4. Class 2 severe obesity with serious comorbidity and body mass index (BMI) of 38.0 to 38.9 in adult, unspecified obesity type ([place])  E66.01    Z68.32    with multiple comorbidities IDDM uncontrolled, HTN, HLD  5. Anemia, unspecified type  D64.9 CBC with Differential/Platelet   Hgb 10.9 to 11.6 - chronic, monitoring  6. Encounter for screening mammogram for  malignant neoplasm of breast  Z12.31 MM 3D SCREEN BREAST BILATERAL   due - reorder  7. Bilateral lower extremity edema  P95.0 COMPLETE METABOLIC PANEL WITH GFR    CBC with Differential/Platelet    TSH   on lasix PRN, BNP previously was normal, some respiratory complaints unclear cardiac component, improved with levothyroxine compliance  8. Intertriginous candidiasis  B37.2 nystatin (MYCOSTATIN/NYSTOP) powder   Encouraged her to work on glycemic control, can miconazole or clotrimazole over-the-counter powder with higher dose Diflucan  9. Low back pain without sciatica, unspecified back pain laterality, unspecified chronicity  M54.50    on muscle relaxers - referred to ortho and PT in the past  10. Thyroid disease  E07.9 TSH   Patient today notes that she was previously inconsistent with her levothyroxine medications but taking more regularly she feels good  11. Chest congestion  R09.89 Ambulatory referral to Pulmonology    Fluticasone-Umeclidin-Vilant (TRELEGY ELLIPTA) 100-62.5-25 MCG/INH AEPB   Note 2 years of chest congestion, cough and dyspnea  12. Dyspnea, unspecified type  R06.00 Ambulatory referral to Pulmonology    Fluticasone-Umeclidin-Vilant (TRELEGY ELLIPTA) 100-62.5-25 MCG/INH AEPB   Reports this only recently that it has occurred for 2 years, significant prior smoking history  13. Chronic obstructive pulmonary disease, unspecified COPD type ([place])  J44.9 Fluticasone-Umeclidin-Vilant (TRELEGY  ELLIPTA) 100-62.5-25 MCG/INH AEPB   suspect with smoking hx and cough x 2 years, some wheeze and rhonchi on exam, trial of trelegy - referral to pulm for PFTs  14. Rhinitis, unspecified type  J31.0    nasal congestion, swelling, nasal discharge - pt instructed to start antihistamine and steroid nasal spray daily    Pts recent respiratory complaints -chest x-ray was done 11/17/2019 -lingula scarring otherwise unremarkable, she denies any history of asthma or COPD but then noted prior significant  smoking history though she has stopped smoking for several years -could possibly be COPD?  Initiated treatment with Trelegy and encouraged her to follow-up with pulmonology for further evaluation/PFTs Did previously do evaluation for CHF, BNP was normal, her lower extremity edema seemed to improve with better thyroid medication compliance and she has not required Lasix though she still could possibly get a cardiac evaluation and see if there is any cardiac component to her respiratory complaints? She does have rhinitis and recurrent postnasal drip and this may also be contributing towards her coughing symptoms encouraged her to manage this with antihistamines and intranasal steroids  Return in about 6 months (around 07/27/2020).   [name], PA-C 01/28/20 10:23 AM

## 2020-01-28 NOTE — Patient Instructions (Addendum)
Return and get labs done in about 2 months - come on open weekday, between 8 am and noon or 2 pm and 4 pm, best to try and be fasting for labs  Just come to the lobby and tell them you are here for labs  [facility] at [facility] [address],  [place]  [address] Get Driving Directions Main: [phone]  Miconazole or clotrimazole powder 2% -over the counter - you can ask the pharmacist to help you find and may try this in place of nystatin powder

## 2020-01-31 ENCOUNTER — Other Ambulatory Visit: Payer: Self-pay | Admitting: Family Medicine

## 2020-01-31 DIAGNOSIS — E785 Hyperlipidemia, unspecified: Secondary | ICD-10-CM

## 2020-01-31 DIAGNOSIS — E119 Type 2 diabetes mellitus without complications: Secondary | ICD-10-CM

## 2020-02-05 ENCOUNTER — Telehealth: Payer: Self-pay

## 2020-02-05 NOTE — Chronic Care Management (AMB) (Signed)
Chronic Care Management Pharmacy Assistant   Name: Tamara [name]  MRN: [id number] DOB: 12/22/1945  Reason for Encounter: Disease State - Hypertension Adherence Call.  Patient Questions:  1.  Have you seen any other providers since your last visit? Yes. 01/28/2020 Annalee Genta , PA -C Hypertensions - labs ordered 12/19/2019- 09/17/2019 - Renato Shin, MD Endocrinology  12/11/2019 Dillard Cannon , Optometry. 11/15/2019 [name] PA- C Chest Congestion- Labs ordered - Furosemide / Fluconazole / nystatin  07/24/2019 [name] PA-C Hyperlipidemia - Tizanidine 4 mg      2.  Any changes in your medicines or health? No   PCP : [name], PA-C  Allergies:   Allergies  Allergen Reactions   Penicillins Hives    Medications: Outpatient Encounter Medications as of 02/05/2020  Medication Sig   Accu-Chek Softclix Lancets lancets TEST BLOOD SUGAR TWICE DAILY. (Patient not taking: Reported on 01/28/2020)   Alcohol Swabs (B-D SINGLE USE SWABS REGULAR) PADS TEST BLOOD SUGAR TWICE DAILY DX:E11.65 (Patient not taking: Reported on 01/28/2020)   Ascorbic Acid (VITAMIN C) 1000 MG tablet Take 1,000 mg by mouth daily.   Blood Glucose Monitoring Suppl (TRUE METRIX METER) w/Device KIT USE AS DIRECTED (Patient not taking: Reported on 01/28/2020)   Cholecalciferol (VITAMIN D) 50 MCG (2000 UT) CAPS Take 2,000 Units by mouth daily.   DROPLET PEN NEEDLES 32G X 4 MM MISC    fluconazole (DIFLUCAN) 100 MG tablet Take 200 mg po x 1d, then take 100 mg po daily x 13 days   Fluticasone-Umeclidin-Vilant (TRELEGY ELLIPTA) 100-62.5-25 MCG/INH AEPB Inhale 1 puff into the lungs daily. Do dose the same time of day once daily   furosemide (LASIX) 20 MG tablet Take 1 tablet (20 mg total) by mouth daily as needed for fluid or edema.   hydrochlorothiazide (HYDRODIURIL) 25 MG tablet Take 1 tablet (25 mg total) by mouth daily.   Insulin NPH, Human,, Isophane, (NOVOLIN N FLEXPEN) 100 UNIT/ML Kiwkpen  Inject 64 Units into the skin every morning. And pen needles (13mm x 32G) 1/day   levothyroxine (SYNTHROID) 25 MCG tablet TAKE 1 TABLET (25 MCG TOTAL) BY MOUTH DAILY BEFORE BREAKFAST. (DOSE CHANGE)   lisinopril (ZESTRIL) 10 MG tablet Take 1 tablet (10 mg total) by mouth daily.   meloxicam (MOBIC) 7.5 MG tablet Take 1 tablet (7.5 mg total) by mouth daily.   metFORMIN (GLUCOPHAGE) 1000 MG tablet TAKE 1 TABLET TWICE DAILY WITH MEALS   nystatin (MYCOSTATIN/NYSTOP) powder Apply 1 application topically 3 (three) times daily as needed.   Omega-3 Fatty Acids (FISH OIL) 1000 MG CAPS Take 1 capsule by mouth daily.   pantoprazole (PROTONIX) 40 MG tablet Take 1 tablet (40 mg total) by mouth daily.   potassium chloride SA (KLOR-CON) 20 MEQ tablet Take 1 tablet (20 mEq total) by mouth daily.   rosuvastatin (CRESTOR) 20 MG tablet TAKE 1 TABLET (20 MG TOTAL) BY MOUTH AT BEDTIME.   tiZANidine (ZANAFLEX) 4 MG tablet TAKE 1 TABLET (4 MG TOTAL) BY MOUTH EVERY 8 (EIGHT) HOURS AS NEEDED FOR MUSCLE SPASMS.   No facility-administered encounter medications on file as of 02/05/2020.    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 Spoke with patient regarding BP  Recent Office Vitals: BP Readings from Last 3 Encounters:  01/28/20 132/76  12/19/19 128/72  11/15/19 138/72   Pulse Readings from Last 3 Encounters:  01/28/20 98  12/19/19 62  11/15/19 98    Wt Readings from Last 3 Encounters:   01/28/20 238 lb 1.6 oz (108 kg)  12/19/19 230 lb (104.3 kg)  11/15/19 229 lb 6.4 oz (104.1 kg)     Kidney Function Lab Results  Component Value Date/Time   CREATININE 0.91 11/15/2019 03:23 PM   CREATININE 0.91 03/22/2019 10:20 AM   GFRNONAA 63 11/15/2019 03:23 PM   GFRAA 73 11/15/2019 03:23 PM    BMP Latest Ref Rng & Units 11/15/2019 03/22/2019 11/22/2018  Glucose 65 - 99 mg/dL 174(H) 285(H) 205(H)  BUN 7 - 25 mg/dL $Remove'15 18 13  'euvnHio$ Creatinine 0.60 - 0.93 mg/dL 0.91 0.91 0.98(H)  BUN/Creat Ratio 6 - 22 (calc) NOT APPLICABLE NOT APPLICABLE 13  Sodium 892 - 146 mmol/L 139 136 137  Potassium 3.5 - 5.3 mmol/L 4.4 4.7 4.4  Chloride 98 - 110 mmol/L 102 101 101  CO2 20 - 32 mmol/L $RemoveB'28 26 27  'NlbpekuK$ Calcium 8.6 - 10.4 mg/dL 10.1 10.2 9.5    Current antihypertensive regimen:  o Hydrochlorothiazide 25 mg one tablet a day o Lisinopril 10 mg one a day.   How often are you checking your Blood Pressure? Patient states she does not take at home.   Current home BP readings: None   What recent interventions/DTPs have been made by any provider to improve Blood Pressure control since last CPP Visit: Patient states she has been taking her medication as directed.   Any recent hospitalizations or ED visits since last visit with CPP? No   What diet changes have been made to improve Blood Pressure Control?  o Patient states she has been watching her salt intake. o   What exercise is being done to improve your Blood Pressure Control?  o Patient states she does not exercise , but states she is active, states she cleans and takes care of house.  o   Adherence Review: Is the patient currently on ACE/ARB medication?   Does the patient have >5 day gap between last estimated fill dates?    Follow-Up:  Pharmacist Review - Reviewed  Medications adherence report, patient's medication adherence for Hypertension is 100% med compliance.Daron Offer CPP Notified  Judithann Sheen, [place]  Pharmacist Assistant [phone]

## 2020-03-19 ENCOUNTER — Other Ambulatory Visit: Payer: Self-pay

## 2020-03-19 ENCOUNTER — Ambulatory Visit: Payer: Medicare HMO | Admitting: Endocrinology

## 2020-03-19 VITALS — BP 150/74 | HR 100 | Ht 66.0 in | Wt 237.0 lb

## 2020-03-19 DIAGNOSIS — E1165 Type 2 diabetes mellitus with hyperglycemia: Secondary | ICD-10-CM | POA: Diagnosis not present

## 2020-03-19 DIAGNOSIS — IMO0002 Reserved for concepts with insufficient information to code with codable children: Secondary | ICD-10-CM

## 2020-03-19 DIAGNOSIS — E118 Type 2 diabetes mellitus with unspecified complications: Secondary | ICD-10-CM | POA: Diagnosis not present

## 2020-03-19 DIAGNOSIS — Z6838 Body mass index (BMI) 38.0-38.9, adult: Secondary | ICD-10-CM

## 2020-03-19 LAB — POCT GLYCOSYLATED HEMOGLOBIN (HGB A1C): Hemoglobin A1C: 7.5 % — AB (ref 4.0–5.6)

## 2020-03-19 MED ORDER — TRUE METRIX METER W/DEVICE KIT: 1.0000 | PACK | Freq: Once | 0 refills | Status: AC

## 2020-03-19 MED ORDER — DROPLET PEN NEEDLES 32G X 4 MM MISC: 1.0000 | Freq: Every day | 3 refills | Status: DC

## 2020-03-19 NOTE — Patient Instructions (Addendum)
Your blood pressure is high today.  Please see your primary care provider soon, to have it rechecked check your blood sugar once a day.  vary the time of day when you check, between before the 3 meals, and at bedtime.  also check if you have symptoms of your blood sugar being too high or too low.  please keep a record of the readings and bring it to your next appointment here (or you can bring the meter itself).  You can write it on any piece of paper.  please call us sooner if your blood sugar goes below 70, or if you have a lot of readings over 200. Please continue the same insulin and metformin.   On this type of insulin schedule, you should eat meals on a regular schedule.  If a meal is missed or significantly delayed, your blood sugar could go low.   Please come back for a follow-up appointment in 4 months.

## 2020-03-19 NOTE — Progress Notes (Signed)
Subjective:    Patient ID: Tamara [name], female    DOB: [birth year], 75 y.o.   MRN: [id number]  HPI Pt returns for f/u of diabetes mellitus: DM type: Insulin-requiring type 2 Dx'ed: 1936 Complications: none Therapy: insulin since 2021, and metformin.   GDM: never DKA: never Severe hypoglycemia: never Pancreatitis: never Pancreatic imaging: never SDOH: she cannot afford brand name meds.  Other: she also took insulin 2017-2018; she removed the need for insulin by losing 40 lbs; she declines weight loss surgery; she declines multiple daily injections.   Interval history: we are unable to access meter data, but pt states cbg's vary from 88-200.  It is in general higher as the day goes on.  She takes insulin as rx'ed.  Past Medical History:  Diagnosis Date  . Diabetes mellitus without complication ([place])   . Gastritis 09/05/2017  . GERD (gastroesophageal reflux disease)   . Hyperlipidemia   . Hypertension   . RUQ pain 03/09/2017   Per patient, she has had RUQ pain 4-5 years that has grown worse in the last few months.    Past Surgical History:  Procedure Laterality Date  . BREAST BIOPSY Left 2008   CORE W/CLIP - NEG  . COLONOSCOPY WITH PROPOFOL N/A 02/08/2017   Procedure: COLONOSCOPY WITH PROPOFOL;  Surgeon: Lollie Sails, MD;  [facility] ENDOSCOPY;  Service: Endoscopy;  Laterality: N/A;  . ESOPHAGOGASTRODUODENOSCOPY (EGD) WITH PROPOFOL N/A 02/08/2017   Procedure: ESOPHAGOGASTRODUODENOSCOPY (EGD) WITH PROPOFOL;  Surgeon: Lollie Sails, MD;  Location: [facility] ENDOSCOPY;  Service: Endoscopy;  Laterality: N/A;  . TUBAL LIGATION      Social History   Socioeconomic History  . Marital status: Married    Spouse name: Not on file  . Number of children: 3  . Years of education: Not on file  . Highest education level: High school graduate  Occupational History  . Occupation: retired  Tobacco Use  . Smoking status: Former Research scientist (life sciences)  . Smokeless tobacco: Never Used   Vaping Use  . Vaping Use: Never used  Substance and Sexual Activity  . Alcohol use: No  . Drug use: No  . Sexual activity: Not on file  Other Topics Concern  . Not on file  Social History Narrative  . Not on file   Social Determinants of Health   Financial Resource Strain: Low Risk   . Difficulty of Paying Living Expenses: Not hard at all  Food Insecurity: No Food Insecurity  . Worried About Charity fundraiser in the Last Year: Never true  . Ran Out of Food in the Last Year: Never true  Transportation Needs: No Transportation Needs  . Lack of Transportation (Medical): No  . Lack of Transportation (Non-Medical): No  Physical Activity: Inactive  . Days of Exercise per Week: 0 days  . Minutes of Exercise per Session: 0 min  Stress: No Stress Concern Present  . Feeling of Stress : Not at all  Social Connections: Moderately Isolated  . Frequency of Communication with Friends and Family: More than three times a week  . Frequency of Social Gatherings with Friends and Family: More than three times a week  . Attends Religious Services: Never  . Active Member of Clubs or Organizations: No  . Attends Archivist Meetings: Never  . Marital Status: Married  Human resources officer Violence: Not At Risk  . Fear of Current or Ex-Partner: No  . Emotionally Abused: No  . Physically Abused: No  . Sexually Abused: No  Current Outpatient Medications on File Prior to Visit  Medication Sig Dispense Refill  . Accu-Chek Softclix Lancets lancets TEST BLOOD SUGAR TWICE DAILY. 200 each 11  . Alcohol Swabs (B-D SINGLE USE SWABS REGULAR) PADS TEST BLOOD SUGAR TWICE DAILY DX:E11.65 100 each 5  . Ascorbic Acid (VITAMIN C) 1000 MG tablet Take 1,000 mg by mouth daily.    . Cholecalciferol (VITAMIN D) 50 MCG (2000 UT) CAPS Take 2,000 Units by mouth daily.    . fluconazole (DIFLUCAN) 100 MG tablet Take 200 mg po x 1d, then take 100 mg po daily x 13 days 15 tablet 0  . Fluticasone-Umeclidin-Vilant  (TRELEGY ELLIPTA) 100-62.5-25 MCG/INH AEPB Inhale 1 puff into the lungs daily. Do dose the same time of day once daily 28 each 11  . furosemide (LASIX) 20 MG tablet Take 1 tablet (20 mg total) by mouth daily as needed for fluid or edema. 30 tablet 1  . hydrochlorothiazide (HYDRODIURIL) 25 MG tablet Take 1 tablet (25 mg total) by mouth daily. 90 tablet 3  . Insulin NPH, Human,, Isophane, (NOVOLIN N FLEXPEN) 100 UNIT/ML Kiwkpen Inject 64 Units into the skin every morning. And pen needles (26mm x 32G) 1/day 60 mL 3  . levothyroxine (SYNTHROID) 25 MCG tablet TAKE 1 TABLET (25 MCG TOTAL) BY MOUTH DAILY BEFORE BREAKFAST. (DOSE CHANGE) 90 tablet 3  . lisinopril (ZESTRIL) 10 MG tablet Take 1 tablet (10 mg total) by mouth daily. 90 tablet 3  . meloxicam (MOBIC) 7.5 MG tablet Take 1 tablet (7.5 mg total) by mouth daily. 90 tablet 3  . metFORMIN (GLUCOPHAGE) 1000 MG tablet TAKE 1 TABLET TWICE DAILY WITH MEALS 180 tablet 1  . nystatin (MYCOSTATIN/NYSTOP) powder Apply 1 application topically 3 (three) times daily as needed. 60 g 2  . Omega-3 Fatty Acids (FISH OIL) 1000 MG CAPS Take 1 capsule by mouth daily.    . pantoprazole (PROTONIX) 40 MG tablet Take 1 tablet (40 mg total) by mouth daily. 90 tablet 3  . potassium chloride SA (KLOR-CON) 20 MEQ tablet Take 1 tablet (20 mEq total) by mouth daily. 90 tablet 1  . rosuvastatin (CRESTOR) 20 MG tablet TAKE 1 TABLET (20 MG TOTAL) BY MOUTH AT BEDTIME. 90 tablet 3  . tiZANidine (ZANAFLEX) 4 MG tablet TAKE 1 TABLET (4 MG TOTAL) BY MOUTH EVERY 8 (EIGHT) HOURS AS NEEDED FOR MUSCLE SPASMS. 30 tablet 0   No current facility-administered medications on file prior to visit.    Allergies  Allergen Reactions  . Penicillins Hives    Family History  Problem Relation Age of Onset  . Diabetes Maternal Grandmother   . Breast cancer Neg Hx     BP (!) 150/74 (BP Location: Right Arm, Patient Position: Sitting, Cuff Size: Large)   Pulse 100   Ht 5\' 6"  (1.676 m)   Wt 237 lb  (107.5 kg)   SpO2 95%   BMI 38.25 kg/m    Review of Systems     Objective:   Physical Exam VITAL SIGNS:  See vs page GENERAL: no distress Pulses: dorsalis pedis intact bilat.   MSK: no deformity of the feet CV: no leg edema Skin:  no ulcer on the feet.  normal color and temp on the feet. Neuro: sensation is intact to touch on the feet     Lab Results  Component Value Date   HGBA1C 7.5 (A) 03/19/2020       Assessment & Plan:  HTN: is noted today Insulin-requiring type 2 DM: this is  the best control this pt should aim for, given this regimen, which does match insulin to her changing needs throughout the day    Patient Instructions  Your blood pressure is high today.  Please see your primary care provider soon, to have it rechecked check your blood sugar once a day.  vary the time of day when you check, between before the 3 meals, and at bedtime.  also check if you have symptoms of your blood sugar being too high or too low.  please keep a record of the readings and bring it to your next appointment here (or you can bring the meter itself).  You can write it on any piece of paper.  please call us sooner if your blood sugar goes below 70, or if you have a lot of readings over 200. Please continue the same insulin and metformin.   On this type of insulin schedule, you should eat meals on a regular schedule.  If a meal is missed or significantly delayed, your blood sugar could go low.   Please come back for a follow-up appointment in 4 months.

## 2020-04-08 ENCOUNTER — Encounter: Payer: Self-pay | Admitting: Internal Medicine

## 2020-04-08 ENCOUNTER — Ambulatory Visit: Payer: Medicare HMO | Admitting: Internal Medicine

## 2020-04-08 ENCOUNTER — Other Ambulatory Visit: Payer: Self-pay

## 2020-04-08 VITALS — BP 132/72 | HR 87 | Temp 97.6°F | Ht 66.0 in | Wt 236.2 lb

## 2020-04-08 DIAGNOSIS — Z87898 Personal history of other specified conditions: Secondary | ICD-10-CM | POA: Diagnosis not present

## 2020-04-08 DIAGNOSIS — Z87891 Personal history of nicotine dependence: Secondary | ICD-10-CM | POA: Diagnosis not present

## 2020-04-08 DIAGNOSIS — R0600 Dyspnea, unspecified: Secondary | ICD-10-CM | POA: Diagnosis not present

## 2020-04-08 DIAGNOSIS — R053 Chronic cough: Secondary | ICD-10-CM | POA: Diagnosis not present

## 2020-04-08 DIAGNOSIS — R0609 Other forms of dyspnea: Secondary | ICD-10-CM

## 2020-04-08 DIAGNOSIS — Z825 Family history of asthma and other chronic lower respiratory diseases: Secondary | ICD-10-CM | POA: Diagnosis not present

## 2020-04-08 LAB — CBC WITH DIFFERENTIAL/PLATELET
Basophils Absolute: 0 10*3/uL (ref 0.0–0.1)
Basophils Relative: 0.6 % (ref 0.0–3.0)
Eosinophils Absolute: 0.4 10*3/uL (ref 0.0–0.7)
Eosinophils Relative: 5 % (ref 0.0–5.0)
HCT: 33.3 % — ABNORMAL LOW (ref 36.0–46.0)
Hemoglobin: 11.2 g/dL — ABNORMAL LOW (ref 12.0–15.0)
Lymphocytes Relative: 21.1 % (ref 12.0–46.0)
Lymphs Abs: 1.5 10*3/uL (ref 0.7–4.0)
MCHC: 33.8 g/dL (ref 30.0–36.0)
MCV: 80.8 fl (ref 78.0–100.0)
Monocytes Absolute: 0.4 10*3/uL (ref 0.1–1.0)
Monocytes Relative: 5.1 % (ref 3.0–12.0)
Neutro Abs: 4.9 10*3/uL (ref 1.4–7.7)
Neutrophils Relative %: 68.2 % (ref 43.0–77.0)
Platelets: 154 10*3/uL (ref 150.0–400.0)
RBC: 4.12 Mil/uL (ref 3.87–5.11)
RDW: 14.7 % (ref 11.5–15.5)
WBC: 7.1 10*3/uL (ref 4.0–10.5)

## 2020-04-08 NOTE — Progress Notes (Signed)
OV 04/08/2020  Subjective:  Patient ID: Tamara [name], female , DOB: [birth year] , age 75 y.o. , MRN: [id number] , ADDRESS: [place] [id number] PCP [name], PA-C Patient Care Team: [name], PA-C as PCP - General (Family Medicine) Germaine Pomfret, [facility] as Pharmacist (Pharmacist)  This Provider for this visit: Treatment Team:  Attending Provider: Brand Males, MD    04/08/2020 -   Chief Complaint  Patient presents with  . Consult    Productive cough with whitish phlegm for 2 years. Shortness of breath with activity.      HPI Tamara [name] 75 y.o. -new consult for chronic cough.  She has past medical history of diabetes and also class II severe obesity.  And hypertension.  Also has associated shortness of breath.  She is a previous 40 pack smoker having quit 16 years ago.  No current CT chest or pulmonary function data.  Lab work in October 2021 shows mild anemia hemoglobin 10.9 g% but normal kidney function.  Chest x-ray October 2021 that I personally visualized shows some lingular scarring per official report but I agree with.  Review of the records indicate that she has told primary care physician she has recurrent chest congestion.  Most recently in October 2021.  Patient is here with her husband.  She tells me that she is a 40 pack smoker having quit 15 years ago.  She specifically said that she did not quit smoking 16 years ago.  She tells me that in January 2020 she had respiratory infection.  She believes this was COVID-19 and she had this diagnosis even before testing was available for the disease.  She says she thinks this was COVID-19 because she has continued to have chronic cough since then.  The cough is associated with shortness of breath.  The cough is severe.  Happens daily.  It is stable since onset.  It is not worsening or getting better.  It wakes her up at night.  When she lies down the cough gets worse.  There are  other aggravating factors that are unspecified.  Sometimes it is episodic.  Recently started on Trelegy but this is made the cough worse.  She does bring up some white sputum.  She sleeps with the head end of the bed elevated for the last few to several years but this is not helping with the cough either.  She gets short of breath for doing dishes but not for changing clothes.  This also since the episode of respiratory viral infection in January 2020.  She has not had the Covid vaccine.  She reports a family history of COPD but no family history of asthma.z  CT Chest data  No results found.    PFT  No flowsheet data found.     has a past medical history of Diabetes mellitus without complication ([place]), Gastritis (09/05/2017), GERD (gastroesophageal reflux disease), Hyperlipidemia, Hypertension, and RUQ pain (03/09/2017).   reports that she quit smoking about 16 years ago. Her smoking use included cigarettes. She has a 40.00 pack-year smoking history. She has never used smokeless tobacco.  Past Surgical History:  Procedure Laterality Date  . BREAST BIOPSY Left 2008   CORE W/CLIP - NEG  . COLONOSCOPY WITH PROPOFOL N/A 02/08/2017   Procedure: COLONOSCOPY WITH PROPOFOL;  Surgeon: Lollie Sails, MD;  Location: [facility] ENDOSCOPY;  Service: Endoscopy;  Laterality: N/A;  . ESOPHAGOGASTRODUODENOSCOPY (EGD) WITH PROPOFOL N/A 02/08/2017  Procedure: ESOPHAGOGASTRODUODENOSCOPY (EGD) WITH PROPOFOL;  Surgeon: Lollie Sails, MD;  Location: [facility] ENDOSCOPY;  Service: Endoscopy;  Laterality: N/A;  . TUBAL LIGATION      Allergies  Allergen Reactions  . Penicillins Hives    Immunization History  Administered Date(s) Administered  . Fluad Quad(high Dose 65+) 09/20/2018, 10/16/2019  . Influenza, High Dose Seasonal PF 10/16/2019  . Influenza,inj,Quad PF,6+ Mos 10/18/2016, 09/29/2017  . Pneumococcal Conjugate-13 10/16/2019  . Pneumococcal Polysaccharide-23 03/26/2013    Family History   Problem Relation Age of Onset  . Diabetes Maternal Grandmother   . Breast cancer Neg Hx      Current Outpatient Medications:  .  Accu-Chek Softclix Lancets lancets, TEST BLOOD SUGAR TWICE DAILY., Disp: 200 each, Rfl: 11 .  Alcohol Swabs (B-D SINGLE USE SWABS REGULAR) PADS, TEST BLOOD SUGAR TWICE DAILY DX:E11.65, Disp: 100 each, Rfl: 5 .  Ascorbic Acid (VITAMIN C) 1000 MG tablet, Take 1,000 mg by mouth daily., Disp: , Rfl:  .  Cholecalciferol (VITAMIN D) 50 MCG (2000 UT) CAPS, Take 2,000 Units by mouth daily., Disp: , Rfl:  .  DROPLET PEN NEEDLES 32G X 4 MM MISC, 1 Device by Other route daily., Disp: 100 each, Rfl: 3 .  Fluticasone-Umeclidin-Vilant (TRELEGY ELLIPTA) 100-62.5-25 MCG/INH AEPB, Inhale 1 puff into the lungs daily. Do dose the same time of day once daily, Disp: 28 each, Rfl: 11 .  furosemide (LASIX) 20 MG tablet, Take 1 tablet (20 mg total) by mouth daily as needed for fluid or edema., Disp: 30 tablet, Rfl: 1 .  hydrochlorothiazide (HYDRODIURIL) 25 MG tablet, Take 1 tablet (25 mg total) by mouth daily., Disp: 90 tablet, Rfl: 3 .  Insulin NPH, Human,, Isophane, (NOVOLIN N FLEXPEN) 100 UNIT/ML Kiwkpen, Inject 64 Units into the skin every morning. And pen needles (18mm x 32G) 1/day, Disp: 60 mL, Rfl: 3 .  levothyroxine (SYNTHROID) 25 MCG tablet, TAKE 1 TABLET (25 MCG TOTAL) BY MOUTH DAILY BEFORE BREAKFAST. (DOSE CHANGE), Disp: 90 tablet, Rfl: 3 .  lisinopril (ZESTRIL) 10 MG tablet, Take 1 tablet (10 mg total) by mouth daily., Disp: 90 tablet, Rfl: 3 .  meloxicam (MOBIC) 7.5 MG tablet, Take 1 tablet (7.5 mg total) by mouth daily., Disp: 90 tablet, Rfl: 3 .  metFORMIN (GLUCOPHAGE) 1000 MG tablet, TAKE 1 TABLET TWICE DAILY WITH MEALS, Disp: 180 tablet, Rfl: 1 .  nystatin (MYCOSTATIN/NYSTOP) powder, Apply 1 application topically 3 (three) times daily as needed., Disp: 60 g, Rfl: 2 .  Omega-3 Fatty Acids (FISH OIL) 1000 MG CAPS, Take 1 capsule by mouth daily., Disp: , Rfl:  .  pantoprazole  (PROTONIX) 40 MG tablet, Take 1 tablet (40 mg total) by mouth daily., Disp: 90 tablet, Rfl: 3 .  potassium chloride SA (KLOR-CON) 20 MEQ tablet, Take 1 tablet (20 mEq total) by mouth daily., Disp: 90 tablet, Rfl: 1 .  rosuvastatin (CRESTOR) 20 MG tablet, TAKE 1 TABLET (20 MG TOTAL) BY MOUTH AT BEDTIME., Disp: 90 tablet, Rfl: 3 .  tiZANidine (ZANAFLEX) 4 MG tablet, TAKE 1 TABLET (4 MG TOTAL) BY MOUTH EVERY 8 (EIGHT) HOURS AS NEEDED FOR MUSCLE SPASMS., Disp: 30 tablet, Rfl: 0      Objective:   Vitals:   04/08/20 1407  BP: 132/72  Pulse: 87  Temp: 97.6 F (36.4 C)  TempSrc: Temporal  SpO2: 95%  Weight: 236 lb 3.2 oz (107.1 kg)  Height: 5\' 6"  (1.676 m)    Estimated body mass index is 38.12 kg/m as calculated from the  following:   Height as of this encounter: 5\' 6"  (1.676 m).   Weight as of this encounter: 236 lb 3.2 oz (107.1 kg).  @WEIGHTCHANGE @  [employer]   04/08/20 1407  Weight: 236 lb 3.2 oz (107.1 kg)     Physical Exam  General Appearance:    Alert, cooperative, no distress, appears stated age - looks well , Deconditioned looking - no , OBESE  - no, Sitting on Wheelchair -  no  Head:    Normocephalic, without obvious abnormality, atraumatic  Eyes:    PERRL, conjunctiva/corneas clear,  Ears:    Normal TM's and external ear canals, both ears  Nose:   Nares normal, septum midline, mucosa normal, no drainage    or sinus tenderness. OXYGEN ON  - no . Patient is @ no   Throat:   Lips, mucosa, and tongue normal; teeth and gums normal. Cyanosis on lips - no  Neck:   Supple, symmetrical, trachea midline, no adenopathy;    thyroid:  no enlargement/tenderness/nodules; no carotid   bruit or JVD  Back:     Symmetric, no curvature, ROM normal, no CVA tenderness  Lungs:     Distress - no , Wheeze no, Barrell Chest - no, Purse lip breathing - no, Crackles - no   Chest Wall:    No tenderness or deformity.    Heart:    Regular rate and rhythm, S1 and S2 normal, no rub   or gallop,  Murmur - no  Breast Exam:    NOT DONE  Abdomen:     Soft, non-tender, bowel sounds active all four quadrants,    no masses, no organomegaly. Visceral obesity - yes  Genitalia:   NOT DONE  Rectal:   NOT DONE  Extremities:   Extremities - normal, Has Cane - no, Clubbing - no, Edema - no  Pulses:   2+ and symmetric all extremities  Skin:   Stigmata of Connective Tissue Disease - no  Lymph nodes:   Cervical, supraclavicular, and axillary nodes normal  Psychiatric:  Neurologic:   Pleasant - yes, Anxious - no, Flat affect - no  CAm-ICU - neg, Alert and Oriented x 3 - yes, Moves all 4s - yes, Speech - normal, Cognition - intact        Assessment:       ICD-10-CM   1. Chronic cough  R05.3   2. H/O wheezing  Z87.898   3. Dyspnea on exertion  R06.00   4. Stopped smoking with greater than 40 pack year history  Z87.891   5. Family history of COPD (chronic obstructive pulmonary disease)  Z82.5        Plan:     Patient Instructions     ICD-10-CM   1. Chronic cough  R05.3   2. H/O wheezing  Z87.898   3. Dyspnea on exertion  R06.00   4. Stopped smoking with greater than 40 pack year history  Z87.891   5. Family history of COPD (chronic obstructive pulmonary disease)  Z82.5    Wide range of possibilities for her symptoms including first viral reactive cough, irritable cough syndrome otherwise call cough neuropathy [can explain why Trelegy is not working on making it worse], COPD, asthma, pulmonary fibrosis   Plan  -Check Covid IgG to see evidence of natural immunity - - do cbc with diff, igE,   - do alpha 1 AT phenotype   - do HRCT supine and prone  - do full PFT  - do  FeNO test -For now continue Trelegy  - tell scheduler of your preferences of CT scan location with traffic  Followup  - next 1-3 weeks with app but after completing above     SIGNATURE    Dr. Brand Males, M.D., F.C.C.P,  Pulmonary and Critical Care Medicine Staff Physician, [place]  Director - Interstitial Lung Disease  Program  Pulmonary [place] at [place], [place], [address]  Pager: [phone], If no answer or between  15:00h - 7:00h: call [phone] Telephone: [phone]  2:47 PM 04/08/2020

## 2020-04-08 NOTE — Addendum Note (Signed)
Addended by: Suzzanne Cloud E on: 04/08/2020 03:00 PM   Modules accepted: Orders

## 2020-04-08 NOTE — Patient Instructions (Addendum)
ICD-10-CM   1. Chronic cough  R05.3   2. H/O wheezing  Z87.898   3. Dyspnea on exertion  R06.00   4. Stopped smoking with greater than 40 pack year history  Z87.891   5. Family history of COPD (chronic obstructive pulmonary disease)  Z82.5    Wide range of possibilities for her symptoms including first viral reactive cough, irritable cough syndrome otherwise call cough neuropathy [can explain why Trelegy is not working on making it worse], COPD, asthma, pulmonary fibrosis   Plan  -Check Covid IgG to see evidence of natural immunity - - do cbc with diff, igE,   - do alpha 1 AT phenotype   - do HRCT supine and prone  - do full PFT  - do FeNO test -For now continue Trelegy  - tell scheduler of your preferences of CT scan location with traffic  Followup  - next 1-3 weeks with app but after completing above

## 2020-04-08 NOTE — Addendum Note (Signed)
Addended by: Suzzanne Cloud E on: 04/08/2020 03:01 PM   Modules accepted: Orders

## 2020-04-11 ENCOUNTER — Telehealth: Payer: Self-pay

## 2020-04-11 NOTE — Progress Notes (Signed)
Chronic Care Management Pharmacy Assistant   Name: Tamara [name]  MRN: [id number] DOB: [birth year]  Reason for Encounter:Diabetes Disease State Call.   Recent office visits:  No recent Office Visit  Recent consult visits:  03/19/2020 Endocrinology Renato Shin 04/08/2020 Pulmonary [facility] visits:  None in previous 6 months  Medications: Outpatient Encounter Medications as of 04/11/2020  Medication Sig   Accu-Chek Softclix Lancets lancets TEST BLOOD SUGAR TWICE DAILY.   Alcohol Swabs (B-D SINGLE USE SWABS REGULAR) PADS TEST BLOOD SUGAR TWICE DAILY DX:E11.65   Ascorbic Acid (VITAMIN C) 1000 MG tablet Take 1,000 mg by mouth daily.   Cholecalciferol (VITAMIN D) 50 MCG (2000 UT) CAPS Take 2,000 Units by mouth daily.   DROPLET PEN NEEDLES 32G X 4 MM MISC 1 Device by Other route daily.   Fluticasone-Umeclidin-Vilant (TRELEGY ELLIPTA) 100-62.5-25 MCG/INH AEPB Inhale 1 puff into the lungs daily. Do dose the same time of day once daily   furosemide (LASIX) 20 MG tablet Take 1 tablet (20 mg total) by mouth daily as needed for fluid or edema.   hydrochlorothiazide (HYDRODIURIL) 25 MG tablet Take 1 tablet (25 mg total) by mouth daily.   Insulin NPH, Human,, Isophane, (NOVOLIN N FLEXPEN) 100 UNIT/ML Kiwkpen Inject 64 Units into the skin every morning. And pen needles (36mm x 32G) 1/day   levothyroxine (SYNTHROID) 25 MCG tablet TAKE 1 TABLET (25 MCG TOTAL) BY MOUTH DAILY BEFORE BREAKFAST. (DOSE CHANGE)   lisinopril (ZESTRIL) 10 MG tablet Take 1 tablet (10 mg total) by mouth daily.   meloxicam (MOBIC) 7.5 MG tablet Take 1 tablet (7.5 mg total) by mouth daily.   metFORMIN (GLUCOPHAGE) 1000 MG tablet TAKE 1 TABLET TWICE DAILY WITH MEALS   nystatin (MYCOSTATIN/NYSTOP) powder Apply 1 application topically 3 (three) times daily as needed.   Omega-3 Fatty Acids (FISH OIL) 1000 MG CAPS Take 1 capsule by mouth daily.   pantoprazole (PROTONIX) 40 MG tablet Take 1  tablet (40 mg total) by mouth daily.   potassium chloride SA (KLOR-CON) 20 MEQ tablet Take 1 tablet (20 mEq total) by mouth daily.   rosuvastatin (CRESTOR) 20 MG tablet TAKE 1 TABLET (20 MG TOTAL) BY MOUTH AT BEDTIME.   tiZANidine (ZANAFLEX) 4 MG tablet TAKE 1 TABLET (4 MG TOTAL) BY MOUTH EVERY 8 (EIGHT) HOURS AS NEEDED FOR MUSCLE SPASMS.   No facility-administered encounter medications on file as of 04/11/2020.    Star Rating Drugs:lisinopril,metformin,rosuvastatin  Recent Relevant Labs: Lab Results  Component Value Date/Time   HGBA1C 7.5 (A) 03/19/2020 01:27 PM   HGBA1C 8.0 (A) 12/19/2019 01:42 PM   HGBA1C 10.8 (H) 03/22/2019 10:20 AM   HGBA1C 8.2 (H) 11/22/2018 12:00 AM   MICROALBUR 0.5 09/05/2017 08:17 AM   MICROALBUR 0.2 08/23/2016 08:25 AM    Kidney Function Lab Results  Component Value Date/Time   CREATININE 0.91 11/15/2019 03:23 PM   CREATININE 0.91 03/22/2019 10:20 AM   GFRNONAA 63 11/15/2019 03:23 PM   GFRAA 73 11/15/2019 03:23 PM     Current antihyperglycemic regimen:   Metformin 1000 mg twice daily   Novolin N 60 units daily   What recent interventions/DTPs have been made to improve glycemic control:  ? Discussed carbohydrate counting and exercising as tolerated extensively ? Will initiate blood sugar monitoring plan   Have there been any recent hospitalizations or ED visits since last visit with [facility]PP? No  Patient denies hypoglycemic symptoms, including Pale, Sweaty, Shaky, Hungry, Nervous/irritable and Vision changes  Patient  denies hyperglycemic symptoms, including blurry vision, excessive thirst, fatigue, polyuria and weakness  How often are you checking your blood sugar? twice daily  What are your blood sugars ranging?  o Fasting: N/A o Before meals:  - Patient states her Blood sugar ranges from 99 to 137. o After meals: N/A o Bedtime: N/A  During the week, how often does your blood glucose drop below 70? Never  Are you checking your feet  daily/regularly?   Patient denies pain,numbness,burning or tingling in her feet.  Adherence Review: Is the patient currently on a STATIN medication? Yes Is the patient currently on ACE/ARB medication? Yes Does the patient have >5 day gap between last estimated fill dates? No  Anderson Malta Clinical Production designer, theatre/television/film [phone]

## 2020-04-13 NOTE — Progress Notes (Signed)
Tammy - you are going to see her in April for followu; Has not had covid vaccine and IgG is negative. Plan - recommend covid vaccine

## 2020-04-16 ENCOUNTER — Ambulatory Visit (INDEPENDENT_AMBULATORY_CARE_PROVIDER_SITE_OTHER)
Admission: RE | Admit: 2020-04-16 | Discharge: 2020-04-16 | Disposition: A | Discharge status: Home / Self Care | Payer: Medicare HMO | Source: Ambulatory Visit | Attending: Internal Medicine | Admitting: Internal Medicine

## 2020-04-16 ENCOUNTER — Other Ambulatory Visit: Payer: Self-pay

## 2020-04-16 DIAGNOSIS — R0600 Dyspnea, unspecified: Secondary | ICD-10-CM | POA: Diagnosis not present

## 2020-04-16 DIAGNOSIS — I2510 Atherosclerotic heart disease of native coronary artery without angina pectoris: Secondary | ICD-10-CM | POA: Diagnosis not present

## 2020-04-16 DIAGNOSIS — R0609 Other forms of dyspnea: Secondary | ICD-10-CM

## 2020-04-16 DIAGNOSIS — J984 Other disorders of lung: Secondary | ICD-10-CM | POA: Diagnosis not present

## 2020-04-16 DIAGNOSIS — R053 Chronic cough: Secondary | ICD-10-CM | POA: Diagnosis not present

## 2020-04-16 DIAGNOSIS — Z87891 Personal history of nicotine dependence: Secondary | ICD-10-CM

## 2020-04-16 DIAGNOSIS — Z825 Family history of asthma and other chronic lower respiratory diseases: Secondary | ICD-10-CM

## 2020-04-16 DIAGNOSIS — I700 Atherosclerosis of aorta: Secondary | ICD-10-CM | POA: Diagnosis not present

## 2020-04-16 DIAGNOSIS — Z87898 Personal history of other specified conditions: Secondary | ICD-10-CM

## 2020-04-18 ENCOUNTER — Other Ambulatory Visit: Payer: Medicare HMO

## 2020-04-19 LAB — ALPHA-1 ANTITRYPSIN PHENOTYPE: A-1 Antitrypsin, Ser: 143 mg/dL (ref 83–199)

## 2020-04-19 LAB — IGE: IgE (Immunoglobulin E), Serum: 16 kU/L (ref <=114)

## 2020-04-19 LAB — SARS-COV-2 ANTIBODY(IGG)SPIKE,SEMI-QUANTITATIVE: SARS COV1 AB(IGG)SPIKE,SEMI QN: <1 index (ref <1.00)

## 2020-04-22 ENCOUNTER — Ambulatory Visit: Payer: Medicare HMO | Admitting: Adult Health

## 2020-04-24 ENCOUNTER — Telehealth: Payer: Self-pay | Admitting: Internal Medicine

## 2020-04-24 DIAGNOSIS — R918 Other nonspecific abnormal finding of lung field: Secondary | ICD-10-CM

## 2020-04-24 NOTE — Telephone Encounter (Signed)
  Tamara [name]   - Please let Lianne Bushy that CT chest shows a 7.8cm LLL mass  Plan  - get Super D of the CT - get PET scan  - get followup with [facility] or [facility] for this (ahead of visit with TP early May 2022)   Thanks  MR  xxxxxxxxxxxxxxxxx  IMPRESSION: 1. There is a very dense, masslike consolidation of the medial posterior left lower lobe measuring 7.8 x 6.8 x 4.2 cm, with evidence of internal cavitation. Findings are highly concerning for malignancy although cavitary infection is a differential consideration. 2. No evidence of lymphadenopathy or metastatic disease in the chest. 3. Additional mild, predominantly bandlike scarring bilaterally and some irregular ground-glass and minimal architectural distortion of the bilateral anterior upper lobes. These findings are nonspecific and most likely reflect sequelae of prior infection or inflammation. 4. Coronary artery disease.  Aortic Atherosclerosis (ICD10-I70.0).   Electronically Signed   By: Eddie Candle M.D.   On: 04/16/2020 18:45

## 2020-04-25 ENCOUNTER — Other Ambulatory Visit: Payer: Medicare HMO

## 2020-04-25 NOTE — Telephone Encounter (Signed)
Attempted to call pt but unable to reach. Left message for her to return call. 

## 2020-04-25 NOTE — Telephone Encounter (Signed)
Called and spoke with patient, advised of results/recommendations per Dr. Chase Caller.  Consult appointment sent up with Dr. Lamonte Sakai on 05/13/20 at 2pm, advised to arrive by 1:45 pm for check in.  Advised that one of our PCCs will call her to set up the super D CT and the PET scan.  She verbalized understanding.  Nothing further needed.

## 2020-05-05 ENCOUNTER — Other Ambulatory Visit: Payer: Self-pay | Admitting: Family Medicine

## 2020-05-05 DIAGNOSIS — I10 Essential (primary) hypertension: Secondary | ICD-10-CM

## 2020-05-05 DIAGNOSIS — E119 Type 2 diabetes mellitus without complications: Secondary | ICD-10-CM

## 2020-05-06 ENCOUNTER — Other Ambulatory Visit: Payer: Self-pay | Admitting: Family Medicine

## 2020-05-06 DIAGNOSIS — E119 Type 2 diabetes mellitus without complications: Secondary | ICD-10-CM

## 2020-05-06 DIAGNOSIS — B372 Candidiasis of skin and nail: Secondary | ICD-10-CM

## 2020-05-06 DIAGNOSIS — M25519 Pain in unspecified shoulder: Secondary | ICD-10-CM

## 2020-05-06 DIAGNOSIS — M542 Cervicalgia: Secondary | ICD-10-CM

## 2020-05-06 DIAGNOSIS — M545 Low back pain, unspecified: Secondary | ICD-10-CM

## 2020-05-06 DIAGNOSIS — I10 Essential (primary) hypertension: Secondary | ICD-10-CM

## 2020-05-06 DIAGNOSIS — R600 Localized edema: Secondary | ICD-10-CM

## 2020-05-07 NOTE — Telephone Encounter (Signed)
Last appt:1/10 Next:7/11  I do know the meloxicam and HCTZ does not need refilling but it want let me take off

## 2020-05-09 ENCOUNTER — Inpatient Hospital Stay: Admission: RE | Admit: 2020-05-09 | Payer: Medicare HMO | Source: Ambulatory Visit

## 2020-05-09 ENCOUNTER — Ambulatory Visit
Admission: RE | Admit: 2020-05-09 | Discharge: 2020-05-09 | Disposition: A | Discharge status: Home / Self Care | Payer: Medicare HMO | Source: Ambulatory Visit | Attending: Internal Medicine | Admitting: Internal Medicine

## 2020-05-09 DIAGNOSIS — J479 Bronchiectasis, uncomplicated: Secondary | ICD-10-CM | POA: Diagnosis not present

## 2020-05-09 DIAGNOSIS — J181 Lobar pneumonia, unspecified organism: Secondary | ICD-10-CM | POA: Diagnosis not present

## 2020-05-09 DIAGNOSIS — I2510 Atherosclerotic heart disease of native coronary artery without angina pectoris: Secondary | ICD-10-CM | POA: Diagnosis not present

## 2020-05-09 DIAGNOSIS — J984 Other disorders of lung: Secondary | ICD-10-CM | POA: Diagnosis not present

## 2020-05-09 DIAGNOSIS — R918 Other nonspecific abnormal finding of lung field: Secondary | ICD-10-CM

## 2020-05-13 ENCOUNTER — Ambulatory Visit (INDEPENDENT_AMBULATORY_CARE_PROVIDER_SITE_OTHER): Payer: Medicare HMO | Admitting: Emergency Medicine

## 2020-05-13 ENCOUNTER — Other Ambulatory Visit: Payer: Self-pay

## 2020-05-13 ENCOUNTER — Encounter: Payer: Self-pay | Admitting: Emergency Medicine

## 2020-05-13 ENCOUNTER — Telehealth: Payer: Self-pay | Admitting: Emergency Medicine

## 2020-05-13 VITALS — BP 124/74 | HR 98 | Temp 97.4°F | Ht 66.0 in | Wt 240.4 lb

## 2020-05-13 DIAGNOSIS — J984 Other disorders of lung: Secondary | ICD-10-CM

## 2020-05-13 DIAGNOSIS — R918 Other nonspecific abnormal finding of lung field: Secondary | ICD-10-CM | POA: Diagnosis not present

## 2020-05-13 DIAGNOSIS — J449 Chronic obstructive pulmonary disease, unspecified: Secondary | ICD-10-CM | POA: Diagnosis not present

## 2020-05-13 DIAGNOSIS — J441 Chronic obstructive pulmonary disease with (acute) exacerbation: Secondary | ICD-10-CM | POA: Insufficient documentation

## 2020-05-13 DIAGNOSIS — R053 Chronic cough: Secondary | ICD-10-CM | POA: Diagnosis not present

## 2020-05-13 DIAGNOSIS — R9389 Abnormal findings on diagnostic imaging of other specified body structures: Secondary | ICD-10-CM | POA: Insufficient documentation

## 2020-05-13 LAB — BASIC METABOLIC PANEL
BUN: 14 mg/dL (ref 6–23)
CO2: 27 mEq/L (ref 19–32)
Calcium: 10 mg/dL (ref 8.4–10.5)
Chloride: 102 mEq/L (ref 96–112)
Creatinine, Ser: 0.95 mg/dL (ref 0.40–1.20)
GFR: 59.43 mL/min — ABNORMAL LOW (ref >60.00)
Glucose, Bld: 139 mg/dL — ABNORMAL HIGH (ref 70–99)
Potassium: 4.3 mEq/L (ref 3.5–5.1)
Sodium: 138 mEq/L (ref 135–145)

## 2020-05-13 LAB — CBC WITH DIFFERENTIAL/PLATELET
Basophils Absolute: 0 10*3/uL (ref 0.0–0.1)
Basophils Relative: 0.5 % (ref 0.0–3.0)
Eosinophils Absolute: 0.4 10*3/uL (ref 0.0–0.7)
Eosinophils Relative: 4.7 % (ref 0.0–5.0)
HCT: 33.1 % — ABNORMAL LOW (ref 36.0–46.0)
Hemoglobin: 11.1 g/dL — ABNORMAL LOW (ref 12.0–15.0)
Lymphocytes Relative: 21.7 % (ref 12.0–46.0)
Lymphs Abs: 1.6 10*3/uL (ref 0.7–4.0)
MCHC: 33.6 g/dL (ref 30.0–36.0)
MCV: 80.1 fl (ref 78.0–100.0)
Monocytes Absolute: 0.5 10*3/uL (ref 0.1–1.0)
Monocytes Relative: 6.2 % (ref 3.0–12.0)
Neutro Abs: 5.1 10*3/uL (ref 1.4–7.7)
Neutrophils Relative %: 66.9 % (ref 43.0–77.0)
Platelets: 159 10*3/uL (ref 150.0–400.0)
RBC: 4.13 Mil/uL (ref 3.87–5.11)
RDW: 14.2 % (ref 11.5–15.5)
WBC: 7.6 10*3/uL (ref 4.0–10.5)

## 2020-05-13 LAB — PROTIME-INR
INR: 1.3 ratio — ABNORMAL HIGH (ref 0.8–1.0)
Prothrombin Time: 14.2 s — ABNORMAL HIGH (ref 9.6–13.1)

## 2020-05-13 NOTE — Patient Instructions (Addendum)
We will work on setting up navigational bronchoscopy, hopefully on 05/19/2020.  This will be done under general anesthesia so you will need a designated driver and someone to stay with you at home on the day of the procedure. Okay to stop your Trelegy Follow with Dr Lamonte Sakai in 1 month

## 2020-05-13 NOTE — Assessment & Plan Note (Signed)
In patient with a significant tobacco history.  Concerning for malignancy.  Could reflect infiltrate/infection but no symptoms that would correspond with acute pneumonia.  She does have chronic cough and the mass is likely a player here.  We discussed tissue diagnosis, possible therapeutic options.  She needs bronchoscopy, ENB and we will arrange ASAP.  She understands the procedure, risk, benefits and agrees.

## 2020-05-13 NOTE — Assessment & Plan Note (Signed)
Presumed COPD, no PFTs available.  She has not gotten any significant benefit from the Trelegy.  It makes her cough worse.  I will stop it.  She will need PFTs and possibly an alternative bronchodilator regimen at some point going forward.

## 2020-05-13 NOTE — Addendum Note (Signed)
Addended by: Suzzanne Cloud E on: 05/13/2020 02:38 PM   Modules accepted: Orders

## 2020-05-13 NOTE — Progress Notes (Signed)
 Subjective:    Patient ID: Tamara [name], female    DOB: 08/16/1945, 74 y.o.   MRN: [id number]  HPI 74-year-old former smoker (40 pack years) with diabetes, hypertension, hyperlipidemia, GERD.  She has been seen by Dr. Ramaswamy for chronic cough with some associated shortness of breath.  The cough seems to start after a viral process in January 2020, question COVID-19 (not confirmed).  She was started prior to pulmonary evaluation on Trelegy.  PFTs ordered but not yet done.  She is on lisinopril, fish oil  A CT scan of her chest done on 04/16/2020 reviewed by me shows no mediastinal or hilar adenopathy but very dense medial posterior left lower lobe opacity 7.8 cm in largest dimension with some air bronchograms and evidence of internal cavitation  Repeat super D CT done on 05/09/2020 reviewed by me, shows that the medial left lower lobe opacity is unchanged.  Note was also made of a right adrenal adenoma.   Review of Systems As per HPI  Past Medical History:  Diagnosis Date  . Diabetes mellitus without complication (HCC)   . Gastritis 09/05/2017  . GERD (gastroesophageal reflux disease)   . Hyperlipidemia   . Hypertension   . RUQ pain 03/09/2017   Per patient, she has had RUQ pain 4-5 years that has grown worse in the last few months.     Family History  Problem Relation Age of Onset  . Diabetes Maternal Grandmother   . Breast cancer Neg Hx      Social History   Socioeconomic History  . Marital status: Married    Spouse name: Not on file  . Number of children: 3  . Years of education: Not on file  . Highest education level: High school graduate  Occupational History  . Occupation: retired  Tobacco Use  . Smoking status: Former Smoker    Packs/day: 1.00    Years: 40.00    Pack years: 40.00    Types: Cigarettes    Quit date: 2006    Years since quitting: 16.3  . Smokeless tobacco: Never Used  Vaping Use  . Vaping Use: Never used  Substance and Sexual Activity   . Alcohol use: No  . Drug use: No  . Sexual activity: Not on file  Other Topics Concern  . Not on file  Social History Narrative  . Not on file   Social Determinants of Health   Financial Resource Strain: Low Risk   . Difficulty of Paying Living Expenses: Not hard at all  Food Insecurity: No Food Insecurity  . Worried About Running Out of Food in the Last Year: Never true  . Ran Out of Food in the Last Year: Never true  Transportation Needs: No Transportation Needs  . Lack of Transportation (Medical): No  . Lack of Transportation (Non-Medical): No  Physical Activity: Inactive  . Days of Exercise per Week: 0 days  . Minutes of Exercise per Session: 0 min  Stress: No Stress Concern Present  . Feeling of Stress : Not at all  Social Connections: Moderately Isolated  . Frequency of Communication with Friends and Family: More than three times a week  . Frequency of Social Gatherings with Friends and Family: More than three times a week  . Attends Religious Services: Never  . Active Member of Clubs or Organizations: No  . Attends Club or Organization Meetings: Never  . Marital Status: Married  Intimate Partner Violence: Not At Risk  . Fear of Current or   Ex-Partner: No  . Emotionally Abused: No  . Physically Abused: No  . Sexually Abused: No     Allergies  Allergen Reactions  . Penicillins Hives     Outpatient Medications Prior to Visit  Medication Sig Dispense Refill  . Accu-Chek Softclix Lancets lancets TEST BLOOD SUGAR TWICE DAILY. 200 each 11  . Alcohol Swabs (B-D SINGLE USE SWABS REGULAR) PADS TEST BLOOD SUGAR TWICE DAILY DX:E11.65 100 each 5  . Ascorbic Acid (VITAMIN C) 1000 MG tablet Take 1,000 mg by mouth daily.    . Cholecalciferol (VITAMIN D) 50 MCG (2000 UT) CAPS Take 2,000 Units by mouth daily.    . DROPLET PEN NEEDLES 32G X 4 MM MISC 1 Device by Other route daily. 100 each 3  . Fluticasone-Umeclidin-Vilant (TRELEGY ELLIPTA) 100-62.5-25 MCG/INH AEPB Inhale 1  puff into the lungs daily. Do dose the same time of day once daily 28 each 11  . furosemide (LASIX) 20 MG tablet Take 1 tablet (20 mg total) by mouth daily as needed for fluid or edema. 30 tablet 1  . hydrochlorothiazide (HYDRODIURIL) 25 MG tablet TAKE 1 TABLET (25 MG TOTAL) BY MOUTH DAILY. 90 tablet 3  . Insulin NPH, Human,, Isophane, (NOVOLIN N FLEXPEN) 100 UNIT/ML Kiwkpen Inject 64 Units into the skin every morning. And pen needles (23mm x 32G) 1/day 60 mL 3  . levothyroxine (SYNTHROID) 25 MCG tablet TAKE 1 TABLET (25 MCG TOTAL) BY MOUTH DAILY BEFORE BREAKFAST. (DOSE CHANGE) 90 tablet 3  . lisinopril (ZESTRIL) 10 MG tablet TAKE 1 TABLET EVERY DAY 90 tablet 3  . meloxicam (MOBIC) 7.5 MG tablet TAKE 1 TABLET (7.5 MG TOTAL) BY MOUTH DAILY. 90 tablet 3  . metFORMIN (GLUCOPHAGE) 1000 MG tablet TAKE 1 TABLET TWICE DAILY WITH MEALS 180 tablet 1  . methocarbamol (ROBAXIN) 500 MG tablet TAKE 1 TABLET EVERY 8 HOURS AS NEEDED FOR MUSCLE SPASMS. 30 tablet 1  . nystatin (MYCOSTATIN/NYSTOP) powder APPLY 1 APPLICATION TOPICALLY TO AFFECTED AREA THREE TIMES DAILY 60 g 1  . Omega-3 Fatty Acids (FISH OIL) 1000 MG CAPS Take 1 capsule by mouth daily.    . pantoprazole (PROTONIX) 40 MG tablet TAKE 1 TABLET EVERY DAY 90 tablet 3  . potassium chloride SA (KLOR-CON) 20 MEQ tablet Take 1 tablet (20 mEq total) by mouth daily. 90 tablet 1  . rosuvastatin (CRESTOR) 20 MG tablet TAKE 1 TABLET (20 MG TOTAL) BY MOUTH AT BEDTIME. 90 tablet 3  . tiZANidine (ZANAFLEX) 4 MG tablet TAKE 1 TABLET (4 MG TOTAL) BY MOUTH EVERY 8 (EIGHT) HOURS AS NEEDED FOR MUSCLE SPASMS. 30 tablet 0   No facility-administered medications prior to visit.        Objective:   Physical Exam Vitals:   05/13/20 1401  BP: 124/74  Pulse: 98  Temp: (!) 97.4 F (36.3 C)  TempSrc: Temporal  SpO2: 94%  Weight: 240 lb 6.4 oz (109 kg)  Height: 5\' 6"  (1.676 m)   Gen: Pleasant, well-nourished, in no distress,  normal affect  ENT: No lesions,  mouth  clear,  oropharynx clear, no postnasal drip  Neck: No JVD, no stridor  Lungs: No use of accessory muscles, no crackles or wheezing on normal respiration, no wheeze on forced expiration  Cardiovascular: RRR, heart sounds normal, no murmur or gallops, no peripheral edem  Musculoskeletal: No deformities, no cyanosis or clubbing  Neuro: alert, awake, non focal  Skin: Warm, no lesions or rash     Assessment & Plan:  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  Concerning for malignancy.  Could reflect infiltrate/infection but no symptoms that would correspond with acute pneumonia.  She does have chronic cough and the mass is likely a player here.  We discussed tissue diagnosis, possible therapeutic options.  She needs bronchoscopy, ENB and we will arrange ASAP.  She understands the procedure, risk, benefits and agrees.  Chronic cough Suspect the lung mass is the largest player here but she is also on an ACE inhibitor which will likely need to be stopped at some point.  Also fish oil with a history of GERD.  Powdered formulation of Trelegy also appears to be a contributor  COPD (chronic obstructive pulmonary disease) (HCC) Presumed COPD, no PFTs available.  She has not gotten any significant benefit from the Trelegy.  It makes her cough worse.  I will stop it.  She will need PFTs and possibly an alternative bronchodilator regimen at some point going forward.    Corban Kistler, MD, PhD 05/13/2020, 2:33 PM [facility] [phone] or if no answer before 7:00PM call [phone] For any issues after 7:00PM please call eLink [phone]  

## 2020-05-13 NOTE — H&P (View-Only) (Signed)
Subjective:    Patient ID: Tamara [name], female    DOB: October 13, 1945, 75 y.o.   MRN: [id number]  HPI 75 year old former smoker (40 pack years) with diabetes, hypertension, hyperlipidemia, GERD.  She has been seen by Dr. Chase Caller for chronic cough with some associated shortness of breath.  The cough seems to start after a viral process in January 2020, question COVID-19 (not confirmed).  She was started prior to pulmonary evaluation on Trelegy.  PFTs ordered but not yet done.  She is on lisinopril, fish oil  A CT scan of her chest done on 04/16/2020 reviewed by me shows no mediastinal or hilar adenopathy but very dense medial posterior left lower lobe opacity 7.8 cm in largest dimension with some air bronchograms and evidence of internal cavitation  Repeat super D CT done on 05/09/2020 reviewed by me, shows that the medial left lower lobe opacity is unchanged.  Note was also made of a right adrenal adenoma.   Review of Systems As per HPI  Past Medical History:  Diagnosis Date  . Diabetes mellitus without complication ([place])   . Gastritis 09/05/2017  . GERD (gastroesophageal reflux disease)   . Hyperlipidemia   . Hypertension   . RUQ pain 03/09/2017   Per patient, she has had RUQ pain 4-5 years that has grown worse in the last few months.     Family History  Problem Relation Age of Onset  . Diabetes Maternal Grandmother   . Breast cancer Neg Hx      Social History   Socioeconomic History  . Marital status: Married    Spouse name: Not on file  . Number of children: 3  . Years of education: Not on file  . Highest education level: High school graduate  Occupational History  . Occupation: retired  Tobacco Use  . Smoking status: Former Smoker    Packs/day: 1.00    Years: 40.00    Pack years: 40.00    Types: Cigarettes    Quit date: 2006    Years since quitting: 16.3  . Smokeless tobacco: Never Used  Vaping Use  . Vaping Use: Never used  Substance and Sexual Activity   . Alcohol use: No  . Drug use: No  . Sexual activity: Not on file  Other Topics Concern  . Not on file  Social History Narrative  . Not on file   Social Determinants of Health   Financial Resource Strain: Low Risk   . Difficulty of Paying Living Expenses: Not hard at all  Food Insecurity: No Food Insecurity  . Worried About Charity fundraiser in the Last Year: Never true  . Ran Out of Food in the Last Year: Never true  Transportation Needs: No Transportation Needs  . Lack of Transportation (Medical): No  . Lack of Transportation (Non-Medical): No  Physical Activity: Inactive  . Days of Exercise per Week: 0 days  . Minutes of Exercise per Session: 0 min  Stress: No Stress Concern Present  . Feeling of Stress : Not at all  Social Connections: Moderately Isolated  . Frequency of Communication with Friends and Family: More than three times a week  . Frequency of Social Gatherings with Friends and Family: More than three times a week  . Attends Religious Services: Never  . Active Member of Clubs or Organizations: No  . Attends Archivist Meetings: Never  . Marital Status: Married  Human resources officer Violence: Not At Risk  . Fear of Current or  Ex-Partner: No  . Emotionally Abused: No  . Physically Abused: No  . Sexually Abused: No     Allergies  Allergen Reactions  . Penicillins Hives     Outpatient Medications Prior to Visit  Medication Sig Dispense Refill  . Accu-Chek Softclix Lancets lancets TEST BLOOD SUGAR TWICE DAILY. 200 each 11  . Alcohol Swabs (B-D SINGLE USE SWABS REGULAR) PADS TEST BLOOD SUGAR TWICE DAILY DX:E11.65 100 each 5  . Ascorbic Acid (VITAMIN C) 1000 MG tablet Take 1,000 mg by mouth daily.    . Cholecalciferol (VITAMIN D) 50 MCG (2000 UT) CAPS Take 2,000 Units by mouth daily.    . DROPLET PEN NEEDLES 32G X 4 MM MISC 1 Device by Other route daily. 100 each 3  . Fluticasone-Umeclidin-Vilant (TRELEGY ELLIPTA) 100-62.5-25 MCG/INH AEPB Inhale 1  puff into the lungs daily. Do dose the same time of day once daily 28 each 11  . furosemide (LASIX) 20 MG tablet Take 1 tablet (20 mg total) by mouth daily as needed for fluid or edema. 30 tablet 1  . hydrochlorothiazide (HYDRODIURIL) 25 MG tablet TAKE 1 TABLET (25 MG TOTAL) BY MOUTH DAILY. 90 tablet 3  . Insulin NPH, Human,, Isophane, (NOVOLIN N FLEXPEN) 100 UNIT/ML Kiwkpen Inject 64 Units into the skin every morning. And pen needles (23mm x 32G) 1/day 60 mL 3  . levothyroxine (SYNTHROID) 25 MCG tablet TAKE 1 TABLET (25 MCG TOTAL) BY MOUTH DAILY BEFORE BREAKFAST. (DOSE CHANGE) 90 tablet 3  . lisinopril (ZESTRIL) 10 MG tablet TAKE 1 TABLET EVERY DAY 90 tablet 3  . meloxicam (MOBIC) 7.5 MG tablet TAKE 1 TABLET (7.5 MG TOTAL) BY MOUTH DAILY. 90 tablet 3  . metFORMIN (GLUCOPHAGE) 1000 MG tablet TAKE 1 TABLET TWICE DAILY WITH MEALS 180 tablet 1  . methocarbamol (ROBAXIN) 500 MG tablet TAKE 1 TABLET EVERY 8 HOURS AS NEEDED FOR MUSCLE SPASMS. 30 tablet 1  . nystatin (MYCOSTATIN/NYSTOP) powder APPLY 1 APPLICATION TOPICALLY TO AFFECTED AREA THREE TIMES DAILY 60 g 1  . Omega-3 Fatty Acids (FISH OIL) 1000 MG CAPS Take 1 capsule by mouth daily.    . pantoprazole (PROTONIX) 40 MG tablet TAKE 1 TABLET EVERY DAY 90 tablet 3  . potassium chloride SA (KLOR-CON) 20 MEQ tablet Take 1 tablet (20 mEq total) by mouth daily. 90 tablet 1  . rosuvastatin (CRESTOR) 20 MG tablet TAKE 1 TABLET (20 MG TOTAL) BY MOUTH AT BEDTIME. 90 tablet 3  . tiZANidine (ZANAFLEX) 4 MG tablet TAKE 1 TABLET (4 MG TOTAL) BY MOUTH EVERY 8 (EIGHT) HOURS AS NEEDED FOR MUSCLE SPASMS. 30 tablet 0   No facility-administered medications prior to visit.        Objective:   Physical Exam Vitals:   05/13/20 1401  BP: 124/74  Pulse: 98  Temp: (!) 97.4 F (36.3 C)  TempSrc: Temporal  SpO2: 94%  Weight: 240 lb 6.4 oz (109 kg)  Height: 5\' 6"  (1.676 m)   Gen: Pleasant, well-nourished, in no distress,  normal affect  ENT: No lesions,  mouth  clear,  oropharynx clear, no postnasal drip  Neck: No JVD, no stridor  Lungs: No use of accessory muscles, no crackles or wheezing on normal respiration, no wheeze on forced expiration  Cardiovascular: RRR, heart sounds normal, no murmur or gallops, no peripheral edem  Musculoskeletal: No deformities, no cyanosis or clubbing  Neuro: alert, awake, non focal  Skin: Warm, no lesions or rash     Assessment & Plan:  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  Concerning for malignancy.  Could reflect infiltrate/infection but no symptoms that would correspond with acute pneumonia.  She does have chronic cough and the mass is likely a player here.  We discussed tissue diagnosis, possible therapeutic options.  She needs bronchoscopy, ENB and we will arrange ASAP.  She understands the procedure, risk, benefits and agrees.  Chronic cough Suspect the lung mass is the largest player here but she is also on an ACE inhibitor which will likely need to be stopped at some point.  Also fish oil with a history of GERD.  Powdered formulation of Trelegy also appears to be a contributor  COPD (chronic obstructive pulmonary disease) ([place]) Presumed COPD, no PFTs available.  She has not gotten any significant benefit from the Trelegy.  It makes her cough worse.  I will stop it.  She will need PFTs and possibly an alternative bronchodilator regimen at some point going forward.    Baltazar Apo, MD, PhD 05/13/2020, 2:33 PM [facility] [phone] or if no answer before 7:00PM call [phone] For any issues after 7:00PM please call eLink [phone]

## 2020-05-13 NOTE — Telephone Encounter (Signed)
I scheduled pt for 5/2 at 9:00 at [facility].  Covid test was already scheduled for 4/29 at 9:15.  Per note in order, [facility] is going to get disc from CT.  I have called pt & left her vm to call me back for appt info.

## 2020-05-13 NOTE — Assessment & Plan Note (Signed)
Suspect the lung mass is the largest player here but she is also on an ACE inhibitor which will likely need to be stopped at some point.  Also fish oil with a history of GERD.  Powdered formulation of Trelegy also appears to be a contributor

## 2020-05-14 NOTE — Telephone Encounter (Signed)
Spoke to pt and gave her appt info.  

## 2020-05-16 ENCOUNTER — Other Ambulatory Visit (HOSPITAL_COMMUNITY)
Admission: RE | Admit: 2020-05-16 | Discharge: 2020-05-16 | Disposition: A | Discharge status: Home / Self Care | Payer: Medicare HMO | Source: Ambulatory Visit | Attending: Emergency Medicine | Admitting: Emergency Medicine

## 2020-05-16 ENCOUNTER — Encounter (HOSPITAL_COMMUNITY): Payer: Self-pay | Admitting: Emergency Medicine

## 2020-05-16 ENCOUNTER — Other Ambulatory Visit: Payer: Self-pay

## 2020-05-16 DIAGNOSIS — Z20822 Contact with and (suspected) exposure to covid-19: Secondary | ICD-10-CM | POA: Diagnosis not present

## 2020-05-16 DIAGNOSIS — Z01812 Encounter for preprocedural laboratory examination: Secondary | ICD-10-CM | POA: Insufficient documentation

## 2020-05-16 LAB — SARS CORONAVIRUS 2 (TAT 6-24 HRS): SARS Coronavirus 2: NEGATIVE

## 2020-05-16 NOTE — Progress Notes (Signed)
Mrs. Laird denies chest pain or shortness of breath. Patient was tested for Covid and has been in quarantine since that time. Mrs. Corrie states that she has had a cough since having Covid over  2 years ago.  Mrs. Seide has type II diabetes, patient reports that CBGs range 95-160.  I instructed patient to check CBG after awaking and every 2 hours until arrival  to the hospital.  I Instructed patient if CBG is less than 70 to drink 1/2 cup of a clear juice. Recheck CBG in 15 minutes if CBG is not over 70 call, pre- op desk at [phone] for further instructions. If scheduled to receive Insulin, do not take Insulin

## 2020-05-19 ENCOUNTER — Ambulatory Visit (HOSPITAL_COMMUNITY): Payer: Medicare HMO

## 2020-05-19 ENCOUNTER — Ambulatory Visit (HOSPITAL_COMMUNITY): Payer: Medicare HMO | Admitting: Anesthesiology

## 2020-05-19 ENCOUNTER — Encounter (HOSPITAL_COMMUNITY): Payer: Self-pay | Admitting: Emergency Medicine

## 2020-05-19 ENCOUNTER — Ambulatory Visit (HOSPITAL_COMMUNITY)
Admission: RE | Admit: 2020-05-19 | Discharge: 2020-05-19 | Disposition: A | Discharge status: Home / Self Care | Payer: Medicare HMO | Attending: Emergency Medicine | Admitting: Emergency Medicine

## 2020-05-19 ENCOUNTER — Ambulatory Visit: Payer: Medicare HMO | Admitting: Adult Health

## 2020-05-19 ENCOUNTER — Encounter (HOSPITAL_COMMUNITY)
Admission: RE | Disposition: A | Discharge status: Home / Self Care | Payer: Self-pay | Source: Home / Self Care | Attending: Emergency Medicine

## 2020-05-19 ENCOUNTER — Other Ambulatory Visit: Payer: Self-pay

## 2020-05-19 DIAGNOSIS — Z791 Long term (current) use of non-steroidal anti-inflammatories (NSAID): Secondary | ICD-10-CM | POA: Insufficient documentation

## 2020-05-19 DIAGNOSIS — Z794 Long term (current) use of insulin: Secondary | ICD-10-CM | POA: Diagnosis not present

## 2020-05-19 DIAGNOSIS — Z7984 Long term (current) use of oral hypoglycemic drugs: Secondary | ICD-10-CM | POA: Insufficient documentation

## 2020-05-19 DIAGNOSIS — M545 Low back pain, unspecified: Secondary | ICD-10-CM

## 2020-05-19 DIAGNOSIS — Z79890 Hormone replacement therapy (postmenopausal): Secondary | ICD-10-CM | POA: Diagnosis not present

## 2020-05-19 DIAGNOSIS — R9389 Abnormal findings on diagnostic imaging of other specified body structures: Secondary | ICD-10-CM

## 2020-05-19 DIAGNOSIS — I10 Essential (primary) hypertension: Secondary | ICD-10-CM | POA: Insufficient documentation

## 2020-05-19 DIAGNOSIS — E119 Type 2 diabetes mellitus without complications: Secondary | ICD-10-CM | POA: Insufficient documentation

## 2020-05-19 DIAGNOSIS — D3501 Benign neoplasm of right adrenal gland: Secondary | ICD-10-CM | POA: Insufficient documentation

## 2020-05-19 DIAGNOSIS — K219 Gastro-esophageal reflux disease without esophagitis: Secondary | ICD-10-CM | POA: Insufficient documentation

## 2020-05-19 DIAGNOSIS — Z9889 Other specified postprocedural states: Secondary | ICD-10-CM | POA: Diagnosis not present

## 2020-05-19 DIAGNOSIS — Z87891 Personal history of nicotine dependence: Secondary | ICD-10-CM | POA: Diagnosis not present

## 2020-05-19 DIAGNOSIS — R846 Abnormal cytological findings in specimens from respiratory organs and thorax: Secondary | ICD-10-CM | POA: Diagnosis not present

## 2020-05-19 DIAGNOSIS — B372 Candidiasis of skin and nail: Secondary | ICD-10-CM

## 2020-05-19 DIAGNOSIS — R0989 Other specified symptoms and signs involving the circulatory and respiratory systems: Secondary | ICD-10-CM | POA: Diagnosis not present

## 2020-05-19 DIAGNOSIS — J449 Chronic obstructive pulmonary disease, unspecified: Secondary | ICD-10-CM | POA: Insufficient documentation

## 2020-05-19 DIAGNOSIS — M542 Cervicalgia: Secondary | ICD-10-CM

## 2020-05-19 DIAGNOSIS — Z880 Allergy status to penicillin: Secondary | ICD-10-CM | POA: Diagnosis not present

## 2020-05-19 DIAGNOSIS — Z79899 Other long term (current) drug therapy: Secondary | ICD-10-CM | POA: Diagnosis not present

## 2020-05-19 DIAGNOSIS — M25519 Pain in unspecified shoulder: Secondary | ICD-10-CM

## 2020-05-19 DIAGNOSIS — Z419 Encounter for procedure for purposes other than remedying health state, unspecified: Secondary | ICD-10-CM

## 2020-05-19 DIAGNOSIS — R918 Other nonspecific abnormal finding of lung field: Secondary | ICD-10-CM | POA: Diagnosis not present

## 2020-05-19 DIAGNOSIS — E785 Hyperlipidemia, unspecified: Secondary | ICD-10-CM | POA: Insufficient documentation

## 2020-05-19 DIAGNOSIS — J984 Other disorders of lung: Secondary | ICD-10-CM

## 2020-05-19 DIAGNOSIS — I509 Heart failure, unspecified: Secondary | ICD-10-CM

## 2020-05-19 HISTORY — DX: Hypothyroidism, unspecified: E03.9

## 2020-05-19 HISTORY — DX: Unspecified osteoarthritis, unspecified site: M19.90

## 2020-05-19 HISTORY — PX: BRONCHIAL NEEDLE ASPIRATION BIOPSY: SHX5106

## 2020-05-19 HISTORY — PX: BRONCHIAL BIOPSY: SHX5109

## 2020-05-19 HISTORY — DX: Panic disorder (episodic paroxysmal anxiety): F41.0

## 2020-05-19 HISTORY — DX: Dyspnea, unspecified: R06.00

## 2020-05-19 HISTORY — PX: VIDEO BRONCHOSCOPY WITH ENDOBRONCHIAL NAVIGATION: SHX6175

## 2020-05-19 HISTORY — PX: BRONCHIAL WASHINGS: SHX5105

## 2020-05-19 HISTORY — PX: BRONCHIAL BRUSHINGS: SHX5108

## 2020-05-19 LAB — GLUCOSE, CAPILLARY
Glucose-Capillary: 159 mg/dL — ABNORMAL HIGH (ref 70–99)
Glucose-Capillary: 119 mg/dL — ABNORMAL HIGH (ref 70–99)

## 2020-05-19 SURGERY — VIDEO BRONCHOSCOPY WITH ENDOBRONCHIAL NAVIGATION
Anesthesia: General

## 2020-05-19 MED ORDER — PHENYLEPHRINE 40 MCG/ML (10ML) SYRINGE FOR IV PUSH (FOR BLOOD PRESSURE SUPPORT)
PREFILLED_SYRINGE | INTRAVENOUS | Status: DC | PRN
  Administered 2020-05-19 (×5): 80 ug via INTRAVENOUS

## 2020-05-19 MED ORDER — ACETAMINOPHEN 10 MG/ML IV SOLN: 1000.0000 mg | Freq: Once | INTRAVENOUS | Status: DC | PRN

## 2020-05-19 MED ORDER — PROMETHAZINE HCL 25 MG/ML IJ SOLN: 6.2500 mg | INTRAMUSCULAR | Status: DC | PRN

## 2020-05-19 MED ORDER — PROPOFOL 10 MG/ML IV BOLUS
INTRAVENOUS | Status: DC | PRN
  Administered 2020-05-19: 150 mg via INTRAVENOUS

## 2020-05-19 MED ORDER — LACTATED RINGERS IV SOLN: INTRAVENOUS | Status: DC

## 2020-05-19 MED ORDER — OXYCODONE HCL 5 MG/5ML PO SOLN
5.0000 mg | Freq: Once | ORAL | Status: DC | PRN
Start: 2020-05-19 — End: 2020-05-19

## 2020-05-19 MED ORDER — NYSTATIN 100000 UNIT/GM EX POWD: 1.0000 "application " | Freq: Every day | CUTANEOUS | Status: DC | PRN

## 2020-05-19 MED ORDER — DEXAMETHASONE SODIUM PHOSPHATE 10 MG/ML IJ SOLN
INTRAMUSCULAR | Status: DC | PRN
  Administered 2020-05-19: 5 mg via INTRAVENOUS

## 2020-05-19 MED ORDER — NOVOLIN N FLEXPEN 100 UNIT/ML ~~LOC~~ SUPN: 32.0000 [IU] | PEN_INJECTOR | Freq: Two times a day (BID) | SUBCUTANEOUS | Status: DC

## 2020-05-19 MED ORDER — ROCURONIUM BROMIDE 10 MG/ML (PF) SYRINGE
PREFILLED_SYRINGE | INTRAVENOUS | Status: DC | PRN
  Administered 2020-05-19: 60 mg via INTRAVENOUS
  Administered 2020-05-19: 10 mg via INTRAVENOUS

## 2020-05-19 MED ORDER — OXYCODONE HCL 5 MG PO TABS: 5.0000 mg | ORAL_TABLET | Freq: Once | ORAL | Status: DC | PRN

## 2020-05-19 MED ORDER — MELOXICAM 7.5 MG PO TABS: 7.5000 mg | ORAL_TABLET | Freq: Every day | ORAL | Status: DC | PRN

## 2020-05-19 MED ORDER — CHLORHEXIDINE GLUCONATE 0.12 % MT SOLN
15.0000 mL | OROMUCOSAL | Status: AC
  Administered 2020-05-19: 15 mL via OROMUCOSAL
  Filled 2020-05-19 (×2): qty 15

## 2020-05-19 MED ORDER — ONDANSETRON HCL 4 MG/2ML IJ SOLN
INTRAMUSCULAR | Status: DC | PRN
  Administered 2020-05-19: 4 mg via INTRAVENOUS

## 2020-05-19 MED ORDER — FENTANYL CITRATE (PF) 100 MCG/2ML IJ SOLN: 25.0000 ug | INTRAMUSCULAR | Status: DC | PRN

## 2020-05-19 MED ORDER — LIDOCAINE 2% (20 MG/ML) 5 ML SYRINGE
INTRAMUSCULAR | Status: DC | PRN
  Administered 2020-05-19: 60 mg via INTRAVENOUS

## 2020-05-19 MED ORDER — FENTANYL CITRATE (PF) 250 MCG/5ML IJ SOLN
INTRAMUSCULAR | Status: DC | PRN
  Administered 2020-05-19: 100 ug via INTRAVENOUS

## 2020-05-19 MED ORDER — SUGAMMADEX SODIUM 200 MG/2ML IV SOLN
INTRAVENOUS | Status: DC | PRN
  Administered 2020-05-19: 100 mg via INTRAVENOUS
  Administered 2020-05-19: 200 mg via INTRAVENOUS

## 2020-05-19 NOTE — Transfer of Care (Signed)
Immediate Anesthesia Transfer of Care Note  Patient: Tamara [name]  Procedure(s) Performed: VIDEO BRONCHOSCOPY WITH ENDOBRONCHIAL NAVIGATION (N/A ) BRONCHIAL BRUSHINGS BRONCHIAL NEEDLE ASPIRATION BIOPSIES BRONCHIAL BIOPSIES BRONCHIAL WASHINGS  Patient Location: PACU  Anesthesia Type:General  Level of Consciousness: drowsy and patient cooperative  Airway & Oxygen Therapy: Patient Spontanous Breathing  Post-op Assessment: Report given to RN, Post -op Vital signs reviewed and stable and Patient moving all extremities X 4  Post vital signs: Reviewed and stable  Last Vitals:  Vitals Value Taken Time  BP    Temp    Pulse    Resp    SpO2      Last Pain:  Vitals:   05/19/20 0712  TempSrc: Oral  PainSc: 0-No pain      Patients Stated Pain Goal: 3 (40/98/11 9147)  Complications: No complications documented.

## 2020-05-19 NOTE — Discharge Instructions (Signed)
Flexible Bronchoscopy, Care After This sheet gives you information about how to care for yourself after your test. Your doctor may also give you more specific instructions. If you have problems or questions, contact your doctor. Follow these instructions at home: Eating and drinking  Do not eat or drink anything (not even water) for 2 hours after your test, or until your numbing medicine (local anesthetic) wears off.  When your numbness is gone and your cough and gag reflexes have come back, you may: ? Eat only soft foods. ? Slowly drink liquids.  The day after the test, go back to your normal diet. Driving  Do not drive for 24 hours if you were given a medicine to help you relax (sedative).  Do not drive or use heavy machinery while taking prescription pain medicine. General instructions   Take over-the-counter and prescription medicines only as told by your doctor.  Return to your normal activities as told. Ask what activities are safe for you.  Do not use any products that have nicotine or tobacco in them. This includes cigarettes and e-cigarettes. If you need help quitting, ask your doctor.  Keep all follow-up visits as told by your doctor. This is important. It is very important if you had a tissue sample (biopsy) taken. Get help right away if:  You have shortness of breath that gets worse.  You get light-headed.  You feel like you are going to pass out (faint).  You have chest pain.  You cough up: ? More than a little blood. ? More blood than before. Summary  Do not eat or drink anything (not even water) for 2 hours after your test, or until your numbing medicine wears off.  Do not use cigarettes. Do not use e-cigarettes.  Get help right away if you have chest pain.  Please call our office for any questions or concerns.  [phone].  This information is not intended to replace advice given to you by your health care provider. Make sure you discuss any  questions you have with your health care provider. Document Released: 11/01/2008 Document Revised: 12/17/2016 Document Reviewed: 01/23/2016 Elsevier Patient Education  2020 [employer].

## 2020-05-19 NOTE — Interval H&P Note (Signed)
History and Physical Interval Note:  05/19/2020 7:41 AM  Tamara [name]  has presented today for surgery, with the diagnosis of MASS OF LEFT LUNG.  The various methods of treatment have been discussed with the patient and family. After consideration of risks, benefits and other options for treatment, the patient has consented to  Procedure(s): [place] (N/A) as a surgical intervention.  The patient's history has been reviewed, patient examined, no change in status, stable for surgery.  I have reviewed the patient's chart and labs.  Questions were answered to the patient's satisfaction.     Collene Gobble

## 2020-05-19 NOTE — Anesthesia Preprocedure Evaluation (Signed)
Anesthesia Evaluation  Patient identified by MRN, date of birth, ID band Patient awake    Reviewed: Allergy & Precautions, NPO status , Patient's Chart, lab work & pertinent test results  Airway Mallampati: II  TM Distance: >3 FB Neck ROM: Full    Dental  (+) Upper Dentures, Lower Dentures   Pulmonary COPD, former smoker,    Pulmonary exam normal        Cardiovascular hypertension, Pt. on medications  Rhythm:Regular Rate:Normal     Neuro/Psych Anxiety negative neurological ROS     GI/Hepatic Neg liver ROS, GERD  Medicated,  Endo/Other  diabetes, Well Controlled, Type 2, Oral Hypoglycemic Agents, Insulin DependentHypothyroidism   Renal/GU negative Renal ROS  negative genitourinary   Musculoskeletal  (+) Arthritis ,   Abdominal (+)  Abdomen: soft. Bowel sounds: normal.  Peds  Hematology  (+) anemia ,   Anesthesia Other Findings   Reproductive/Obstetrics                             Anesthesia Physical Anesthesia Plan  ASA: II  Anesthesia Plan: General   Post-op Pain Management:    Induction: Intravenous  PONV Risk Score and Plan: 3 and Ondansetron, Dexamethasone and Treatment may vary due to age or medical condition  Airway Management Planned: Mask and Oral ETT  Additional Equipment: None  Intra-op Plan:   Post-operative Plan: Extubation in OR  Informed Consent: I have reviewed the patients History and Physical, chart, labs and discussed the procedure including the risks, benefits and alternatives for the proposed anesthesia with the patient or authorized representative who has indicated his/her understanding and acceptance.     Dental advisory given  Plan Discussed with: CRNA  Anesthesia Plan Comments: (Lab Results      Component                Value               Date                      WBC                      7.6                 05/13/2020                HGB                       11.1 (L)            05/13/2020                HCT                      33.1 (L)            05/13/2020                MCV                      80.1                05/13/2020                PLT                      159.0  05/13/2020           Lab Results      Component                Value               Date                      NA                       138                 05/13/2020                K                        4.3                 05/13/2020                CO2                      27                  05/13/2020                GLUCOSE                  139 (H)             05/13/2020                BUN                      14                  05/13/2020                CREATININE               0.95                05/13/2020                CALCIUM                  10.0                05/13/2020                GFRNONAA                 63                  11/15/2019                GFRAA                    73                  11/15/2019          )        Anesthesia Quick Evaluation

## 2020-05-19 NOTE — Anesthesia Postprocedure Evaluation (Signed)
Anesthesia Post Note  Patient: Tamara [name]  Procedure(s) Performed: VIDEO BRONCHOSCOPY WITH ENDOBRONCHIAL NAVIGATION (N/A ) BRONCHIAL BRUSHINGS BRONCHIAL NEEDLE ASPIRATION BIOPSIES BRONCHIAL BIOPSIES BRONCHIAL WASHINGS     Patient location during evaluation: PACU Anesthesia Type: General Level of consciousness: awake and alert Pain management: pain level controlled Vital Signs Assessment: post-procedure vital signs reviewed and stable Respiratory status: spontaneous breathing, nonlabored ventilation, respiratory function stable and patient connected to nasal cannula oxygen Cardiovascular status: blood pressure returned to baseline and stable Postop Assessment: no apparent nausea or vomiting Anesthetic complications: no   No complications documented.  Last Vitals:  Vitals:   05/19/20 1145 05/19/20 1158  BP: 138/60   Pulse: 85   Resp: 12   Temp: 37 C 37 C  SpO2: 94%     Last Pain:  Vitals:   05/19/20 1145  TempSrc:   PainSc: 0-No pain                 Belenda Cruise P Fayth Trefry

## 2020-05-19 NOTE — Anesthesia Procedure Notes (Signed)
Procedure Name: Intubation Date/Time: 05/19/2020 9:39 AM Performed by: Darletta Moll, CRNA Pre-anesthesia Checklist: Patient identified, Emergency Drugs available, Suction available and Patient being monitored Patient Re-evaluated:Patient Re-evaluated prior to induction Oxygen Delivery Method: Circle system utilized Preoxygenation: Pre-oxygenation with 100% oxygen Induction Type: IV induction Ventilation: Mask ventilation without difficulty and Oral airway inserted - appropriate to patient size Laryngoscope Size: Mac and 3 Grade View: Grade I Tube type: Oral Tube size: 8.5 mm Number of attempts: 1 Airway Equipment and Method: Stylet Placement Confirmation: ETT inserted through vocal cords under direct vision,  positive ETCO2,  breath sounds checked- equal and bilateral and CO2 detector Secured at: 21 cm Tube secured with: Tape Dental Injury: Teeth and Oropharynx as per pre-operative assessment

## 2020-05-19 NOTE — Op Note (Signed)
Video Bronchoscopy with Electromagnetic Navigation Procedure Note  Date of Operation: 05/19/2020  Pre-op Diagnosis: Left lower lobe mass versus consolidation  Post-op Diagnosis: Same  Surgeon: Baltazar Apo  Assistants: None  Anesthesia: General endotracheal anesthesia  Operation: Flexible video fiberoptic bronchoscopy with electromagnetic navigation and biopsies.  Estimated Blood Loss: Minimal  Complications: None apparent  Indications and History: Tamara [name] is a 75 y.o. female with history of tobacco use.  She was found to have consolidated left lower lobe with air bronchograms, appearance consistent with possible mass with cavitation concerning for malignancy.  Recommendation was made to achieve tissue diagnosis via bronchoscopy.  The risks, benefits, complications, treatment options and expected outcomes were discussed with the patient.  The possibilities of pneumothorax, pneumonia, reaction to medication, pulmonary aspiration, perforation of a viscus, bleeding, failure to diagnose a condition and creating a complication requiring transfusion or operation were discussed with the patient who freely signed the consent.    Description of Procedure: The patient was seen in the Preoperative Area, was examined and was deemed appropriate to proceed.  The patient was taken to [facility] endoscopy room 2, identified as Talmadge Chad and the procedure verified as Flexible Video Fiberoptic Bronchoscopy.  A Time Out was held and the above information confirmed.   Prior to the date of the procedure a high-resolution CT scan of the chest was performed. Utilizing [place] a virtual tracheobronchial tree was generated to allow the creation of distinct navigation pathways to the patient's parenchymal abnormality. After being taken to the operating room general anesthesia was initiated and the patient  was orally intubated. The video fiberoptic bronchoscope was introduced via the  endotracheal tube and a general inspection was performed which showed normal airways throughout.  There were no endobronchial lesions or abnormal secretions seen.  Some thin white secretions were suctioned from the left lower lobe airways.  Two endobronchial brushings were performed in the right mainstem bronchus to facilitate [employer] genetic testing. The extendable working channel and locator guide were introduced into the bronchoscope. The distinct navigation pathways prepared prior to this procedure were then utilized to navigate to within 0.3 cm of 2 regions of the patient's left lower lobe abnormality identified on CT scan. The extendable working channel was secured into place and the locator guide was withdrawn. Under fluoroscopic guidance transbronchial needle brushings, transbronchial Wang needle biopsies, and transbronchial forceps biopsies were performed to be sent for cytology and pathology. A bronchioalveolar lavage was performed in the left lower lobe and sent for cytology and microbiology (bacterial, fungal, AFB smears and cultures). At the end of the procedure a general airway inspection was performed and there was no evidence of active bleeding. The bronchoscope was removed.  The patient tolerated the procedure well. There was no significant blood loss and there were no obvious complications. A post-procedural chest x-ray is pending.  Samples: 1. Transbronchial needle brushings from left lower lobe mass 2. Transbronchial Wang needle biopsies from left lower lobe mass 3. Transbronchial forceps biopsies from left lower lobe mass 4. Bronchoalveolar lavage from left lower lobe 5. Endobronchial brushings from right mainstem bronchus  Plans:  The patient will be discharged from the PACU to home when recovered from anesthesia and after chest x-ray is reviewed. We will review the cytology, pathology and microbiology results with the patient when they become available. Outpatient followup will be  with Dr Chase Caller and Dr Lamonte Sakai.    [place]. 05/19/2020

## 2020-05-20 LAB — ACID FAST SMEAR (AFB, MYCOBACTERIA): Acid Fast Smear: NEGATIVE

## 2020-05-21 ENCOUNTER — Encounter (HOSPITAL_COMMUNITY): Payer: Self-pay | Admitting: Emergency Medicine

## 2020-05-21 LAB — CYTOLOGY - NON PAP

## 2020-05-22 NOTE — Progress Notes (Signed)
Already reviewed by Dr Lamonte Sakai

## 2020-05-23 ENCOUNTER — Telehealth: Payer: Self-pay | Admitting: Emergency Medicine

## 2020-05-23 LAB — CYTOLOGY - NON PAP

## 2020-05-23 NOTE — Telephone Encounter (Signed)
Pt scheduled on 06/03/20 for OV with Dr. Lamonte Sakai. Nothing further needed at this time.

## 2020-05-23 NOTE — Telephone Encounter (Signed)
Called the patient.  Her bronchoscopy results are still not back.  I am watching for these and we will contact her when they are available.  She still has a little bit of hemoptysis especially in the mornings.  Reassured her that I believe that this will continue to decrease and then resolve completely.  If it increases or continues then we will consider reimaging.

## 2020-05-23 NOTE — Telephone Encounter (Signed)
Reviewed cytology results with the patient and her husband by phone.  Needle and brushing samples show atypical cells but were not diagnostic for malignancy.  I am still quite concerned based on the absence of any pneumonia symptoms and the appearance of consolidation that there is cancer present.  I discussed with her the probable need for repeat biopsy, alternative biopsy including possibly TTNA.  She will think about this.  I am not sure repeat bronchoscopy would be any more effective than the test we just performed.  She wants to follow-up in office to discuss further.  We may also want to discuss with Dr. Chase Caller who is her primary pulmonologist, get his opinion.    I will try to move up her appointment.  She has a visit with me in the beginning of June already.

## 2020-05-24 LAB — AEROBIC/ANAEROBIC CULTURE W GRAM STAIN (SURGICAL/DEEP WOUND): Culture: NO GROWTH

## 2020-05-27 ENCOUNTER — Telehealth: Payer: Self-pay

## 2020-05-27 NOTE — Progress Notes (Signed)
Spoke to patient to confirmed patient telephone appointment on 05/28/2020 for CCM at 11:00 am with Junius Argyle the Clinical pharmacist.   Patient Verbalized understanding and denies any side effects from any of her current medications.  [place] Pharmacist Assistant [phone]

## 2020-05-28 ENCOUNTER — Ambulatory Visit (INDEPENDENT_AMBULATORY_CARE_PROVIDER_SITE_OTHER): Payer: Medicare HMO

## 2020-05-28 DIAGNOSIS — E785 Hyperlipidemia, unspecified: Secondary | ICD-10-CM | POA: Diagnosis not present

## 2020-05-28 DIAGNOSIS — E1159 Type 2 diabetes mellitus with other circulatory complications: Secondary | ICD-10-CM

## 2020-05-28 DIAGNOSIS — E1169 Type 2 diabetes mellitus with other specified complication: Secondary | ICD-10-CM | POA: Diagnosis not present

## 2020-05-28 DIAGNOSIS — E1165 Type 2 diabetes mellitus with hyperglycemia: Secondary | ICD-10-CM

## 2020-05-28 DIAGNOSIS — I152 Hypertension secondary to endocrine disorders: Secondary | ICD-10-CM | POA: Diagnosis not present

## 2020-05-28 DIAGNOSIS — R053 Chronic cough: Secondary | ICD-10-CM

## 2020-05-28 DIAGNOSIS — J449 Chronic obstructive pulmonary disease, unspecified: Secondary | ICD-10-CM | POA: Diagnosis not present

## 2020-05-28 DIAGNOSIS — K219 Gastro-esophageal reflux disease without esophagitis: Secondary | ICD-10-CM

## 2020-05-28 DIAGNOSIS — E118 Type 2 diabetes mellitus with unspecified complications: Secondary | ICD-10-CM | POA: Diagnosis not present

## 2020-05-28 DIAGNOSIS — IMO0002 Reserved for concepts with insufficient information to code with codable children: Secondary | ICD-10-CM

## 2020-05-28 NOTE — Patient Instructions (Signed)
Visit Information It was great speaking with you today!  Please let me know if you have any questions about our visit.  Goals Addressed            This Visit's Progress   . Monitor and Manage My Blood Sugar-Diabetes Type 2       Timeframe:  Long-Range Goal Priority:  High Start Date:  05/28/2020                            Expected End Date:  11/28/2020                      Follow Up Date 07/09/2020    -check blood sugar twice daily; before breakfast and before supper - check blood sugar if I feel it is too high or too low - enter blood sugar readings and medication or insulin into daily log - take the blood sugar meter to all doctor visits    Why is this important?    Checking your blood sugar at home helps to keep it from getting very high or very low.   Writing the results in a diary or log helps the doctor know how to care for you.   Your blood sugar log should have the time, date and the results.   Also, write down the amount of insulin or other medicine that you take.   Other information, like what you ate, exercise done and how you were feeling, will also be helpful.     Notes:     . Track and Manage My Blood Pressure-Hypertension       Timeframe:  Long-Range Goal Priority:  High Start Date: 05/28/2020                             Expected End Date: 11/28/2020                      Follow Up Date 07/09/2020   - check blood pressure 3 times per week    Why is this important?    You won't feel high blood pressure, but it can still hurt your blood vessels.   High blood pressure can cause heart or kidney problems. It can also cause a stroke.   Making lifestyle changes like losing a little weight or eating less salt will help.   Checking your blood pressure at home and at different times of the day can help to control blood pressure.   If the doctor prescribes medicine remember to take it the way the doctor ordered.   Call the office if you cannot afford the  medicine or if there are questions about it.     Notes:        Patient Care Plan: General Pharmacy (Adult)    Problem Identified: Hypertension, Hyperlipidemia, Diabetes, GERD and COPD   Priority: High    Long-Range Goal: Patient-Specific Goal   Start Date: 05/28/2020  Expected End Date: 11/28/2020  This Visit's Progress: On track  Priority: High  Note:   Current Barriers:  . Unable to achieve control of Breathing  . Unable to maintain control of blood pressure  Pharmacist Clinical Goal(s):  Marland Kitchen Patient will achieve control of COPD as evidenced by stable breathing per patient report . maintain control of blood pressure as evidenced by BP less than 140/90  through collaboration with PharmD and  provider.   Interventions: . 1:1 collaboration with [name], PA-C regarding development and update of comprehensive plan of care as evidenced by provider attestation and co-signature . Inter-disciplinary care team collaboration (see longitudinal plan of care) . Comprehensive medication review performed; medication list updated in electronic medical record  Hypertension (BP goal <140/90) -Not ideally controlled -Current treatment: . Furosmide 20 mg daily as needed . Hydrochlorothiazide 25 mg daily  . Lisinopril 10 mg daily  -Medications previously tried: NA  -Patient with chronic, wet cough for 2+ years. Likely more related to pulmonary issues, but could consider switching to ARB to see if cough improves  -Current home readings: Has not been routinely monitoring -Denies hypotensive/hypertensive symptoms -Educated on Daily salt intake goal < 2300 mg; Importance of home blood pressure monitoring; -Counseled to monitor BP at home three times weekly, document, and provide log at future appointments -Recommended stopping lisinopril due to possible influence on chronic cough  -Recommended Olmesartan 20 mg daily (higher equivalent dose but patient has had elevated readings at past doctor's  visits)  -Recommend BMP at next PCP follow-up.   Hyperlipidemia: (LDL goal < 100) -Controlled -Current treatment: . Rosuvastatin 20 mg daily  -Medications previously tried: NA  -Educated on Importance of limiting foods high in cholesterol; -Recommended to continue current medication  Diabetes (A1c goal <8%) -Controlled -Current medications: Marland Kitchen Metformin 1000 mg twice daily  . NPH 32 units twice daily  -Medications previously tried: NA  -Current home glucose readings . fasting glucose: 95,99, 101 (prior to surgery), 177, 170, 149  . post prandial glucose: NA -Denies hypoglycemic/hyperglycemic symptoms -Educated on Benefits of routine self-monitoring of blood sugar; -Counseled to check feet daily and get yearly eye exams -Recommended to continue current medication  COPD (Goal: control symptoms and prevent exacerbations) -Uncontrolled -Current treatment  . None -Medications previously tried: Trelegy (worsened cough)  -Gold Grade: NA -Current COPD Classification:  B (high sx, <2 exacerbations/yr) -MMRC score: 2-3 per patient report. Gets short of breath walking within house, or walking to mailbox and back. Resolves with rest. -Pulmonary function testing: NA -Exacerbations requiring treatment in last 6 months: None -Patient denies consistent use of maintenance inhaler -Frequency of rescue inhaler use: NA -Counseled on Benefits of consistent maintenance inhaler use  -Patient would likely benefit from long-acting inhaler with different delivery mechanism that does not cause as much irritation.  -Recommended starting Stiolto 2 inhalations once daily (with PAP) -Recommend PFTs.   Hypothyroidism (Goal: Maintain stable thyroid function) -Controlled -Current treatment  . Levothyroxine 25 mcg daily  -Medications previously tried: NA  -Recommended to continue current medication  GERD (Goal: Prevent heartburn/reflux) -Controlled -Current treatment  . Pantoprazole 40 mg daily   -Medications previously tried: NA -Patient with chronic cough of unclear etiology. Per patient reflux symptoms have been well controlled.    -Recommended to continue current medication  Patient Goals/Self-Care Activities . Patient will:  - check glucose twice daily; before breakfast and supper, document, and provide at future appointments check blood pressure three times weekly, document, and provide at future appointments  Follow Up Plan: Telephone follow up appointment with care management team member scheduled for:  07/09/2020 at 11:00 AM     Patient agreed to services and verbal consent obtained.   The patient verbalized understanding of instructions, educational materials, and care plan provided today and declined offer to receive copy of patient instructions, educational materials, and care plan.   Doristine Section, [facility] [phone]

## 2020-05-28 NOTE — Progress Notes (Signed)
Chronic Care Management Pharmacy Note  05/28/2020 Name:  ANELIS HRIVNAK MRN:  [id number] DOB:  09-Dec-1945  Subjective: Tamara [name] is an 75 y.o. year old female who is a primary patient of [name], [place].  The CCM team was consulted for assistance with disease management and care coordination needs.    Engaged with patient by telephone for follow up visit in response to provider referral for pharmacy case management and/or care coordination services.   Consent to Services:  The patient was given information about Chronic Care Management services, agreed to services, and gave verbal consent prior to initiation of services.  Please see initial visit note for detailed documentation.   Patient Care Team: [name], PA-C as PCP - General (Family Medicine) Germaine Pomfret, [facility] as Pharmacist (Pharmacist)  Recent office visits: 01/28/2020: Patient presented to [name], PA-C for follow-up. Trelegy, Fluconazole started.   Recent consult visits: 05/13/20: Patient presented to Dr. Lamonte Sakai (Pulmonology) for mass of left lung. Trelegy stopped due to worsening cough. Bronchoscopy planned.  04/08/20: Patient presented to Dr. Chase Caller (Pulmonology) for chronic cough. No medication changes made.   Hospital visits: 05/19/20: Patient hospitalized for bronchoscopy procedure. Atypical cells, but not maglinant. Repeat testing necessary.  03/19/20: Patient presented to Dr. Loanne Drilling (Endocrinology) for follow-up. A1c 7.5%. No medication changes made.   Objective:  Lab Results  Component Value Date   CREATININE 0.95 05/13/2020   BUN 14 05/13/2020   GFR 59.43 (L) 05/13/2020   GFRNONAA 63 11/15/2019   GFRAA 73 11/15/2019   NA 138 05/13/2020   K 4.3 05/13/2020   CALCIUM 10.0 05/13/2020   CO2 27 05/13/2020   GLUCOSE 139 (H) 05/13/2020    Lab Results  Component Value Date/Time   HGBA1C 7.5 (A) 03/19/2020 01:27 PM   HGBA1C 8.0 (A) 12/19/2019 01:42 PM   HGBA1C 10.8 (H) 03/22/2019  10:20 AM   HGBA1C 8.2 (H) 11/22/2018 12:00 AM   GFR 59.43 (L) 05/13/2020 02:38 PM   MICROALBUR 0.5 09/05/2017 08:17 AM   MICROALBUR 0.2 08/23/2016 08:25 AM    Last diabetic Eye exam:  Lab Results  Component Value Date/Time   HMDIABEYEEXA No Retinopathy 12/29/2017 12:00 AM    Last diabetic Foot exam: No results found for: HMDIABFOOTEX   Lab Results  Component Value Date   CHOL 152 03/22/2019   HDL 42 (L) 03/22/2019   LDLCALC 82 03/22/2019   TRIG 189 (H) 03/22/2019   CHOLHDL 3.6 03/22/2019    Hepatic Function Latest Ref Rng & Units 11/15/2019 03/22/2019 11/22/2018  Total Protein 6.1 - 8.1 g/dL 6.9 6.7 7.1  Albumin 3.6 - 5.1 g/dL - - -  AST 10 - 35 U/L $Remo'31 28 26  'JKyYy$ ALT 6 - 29 U/L $Remo'22 22 24  'syKZY$ Alk Phosphatase 33 - 130 U/L - - -  Total Bilirubin 0.2 - 1.2 mg/dL 0.4 0.4 0.4  Bilirubin, Direct 0.0 - 0.2 mg/dL - - -    Lab Results  Component Value Date/Time   TSH 2.24 11/15/2019 03:23 PM   TSH 2.45 03/22/2019 10:20 AM   FREET4 1.2 11/22/2018 12:00 AM    CBC Latest Ref Rng & Units 05/13/2020 04/08/2020 11/15/2019  WBC 4.0 - 10.5 K/uL 7.6 7.1 6.0  Hemoglobin 12.0 - 15.0 g/dL 11.1(L) 11.2(L) 10.9(L)  Hematocrit 36.0 - 46.0 % 33.1(L) 33.3(L) 33.1(L)  Platelets 150.0 - 400.0 K/uL 159.0 154.0 157    No results found for: VD25OH  Clinical ASCVD: No  The 10-year ASCVD risk score Mikey Bussing  DC Jr., et al., 2013) is: 33.8%   Values used to calculate the score:     Age: 89 years     Sex: Female     Is Non-Hispanic African American: No     Diabetic: Yes     Tobacco smoker: No     Systolic Blood Pressure: 696 mmHg     Is BP treated: Yes     HDL Cholesterol: 42 mg/dL     Total Cholesterol: 152 mg/dL    Depression screen [facility] 2/9 01/28/2020 11/15/2019 10/16/2019  Decreased Interest 0 0 0  Down, Depressed, Hopeless 0 0 0  PHQ - 2 Score 0 0 0  Altered sleeping - - -  Tired, decreased energy - - -  Change in appetite - - -  Feeling bad or failure about yourself  - - -  Trouble concentrating -  - -  Moving slowly or fidgety/restless - - -  Suicidal thoughts - - -  PHQ-9 Score - - -  Difficult doing work/chores - - -     Social History   Tobacco Use  Smoking Status Former Smoker  . Packs/day: 1.00  . Years: 40.00  . Pack years: 40.00  . Types: Cigarettes  . Quit date: 2006  . Years since quitting: 16.3  Smokeless Tobacco Never Used   BP Readings from Last 3 Encounters:  05/19/20 138/60  05/13/20 124/74  04/08/20 132/72   Pulse Readings from Last 3 Encounters:  05/19/20 85  05/13/20 98  04/08/20 87   Wt Readings from Last 3 Encounters:  05/19/20 240 lb (108.9 kg)  05/13/20 240 lb 6.4 oz (109 kg)  04/08/20 236 lb 3.2 oz (107.1 kg)   BMI Readings from Last 3 Encounters:  05/19/20 38.74 kg/m  05/13/20 38.80 kg/m  04/08/20 38.12 kg/m    Assessment/Interventions: Review of patient past medical history, allergies, medications, health status, including review of consultants reports, laboratory and other test data, was performed as part of comprehensive evaluation and provision of chronic care management services.   SDOH:  (Social Determinants of Health) assessments and interventions performed: Yes SDOH Interventions   Flowsheet Row Most Recent Value  SDOH Interventions   Financial Strain Interventions Intervention Not Indicated     SDOH Screenings   Alcohol Screen: Low Risk   . Last Alcohol Screening Score (AUDIT): 0  Depression (PHQ2-9): Low Risk   . PHQ-2 Score: 0  Financial Resource Strain: Medium Risk  . Difficulty of Paying Living Expenses: Somewhat hard  Food Insecurity: No Food Insecurity  . Worried About Charity fundraiser in the Last Year: Never true  . Ran Out of Food in the Last Year: Never true  Housing: Low Risk   . Last Housing Risk Score: 0  Physical Activity: Inactive  . Days of Exercise per Week: 0 days  . Minutes of Exercise per Session: 0 min  Social Connections: Moderately Isolated  . Frequency of Communication with Friends  and Family: More than three times a week  . Frequency of Social Gatherings with Friends and Family: More than three times a week  . Attends Religious Services: Never  . Active Member of Clubs or Organizations: No  . Attends Archivist Meetings: Never  . Marital Status: Married  Stress: No Stress Concern Present  . Feeling of Stress : Not at all  Tobacco Use: Medium Risk  . Smoking Tobacco Use: Former Smoker  . Smokeless Tobacco Use: Never Used  Transportation Needs: No Transportation Needs  .  Lack of Transportation (Medical): No  . Lack of Transportation (Non-Medical): No    CCM Care Plan  Allergies  Allergen Reactions  . Penicillins Hives    Reaction: 1965    Medications Reviewed Today    Reviewed by Bobbie Stack, RN (Registered Nurse) on 05/19/20 at [place] List Status: Complete  Medication Order Taking? Sig Documenting Provider Last Dose Status Informant  Accu-Chek Softclix Lancets lancets [id number] Yes TEST BLOOD SUGAR TWICE DAILY. [name], PA-C 05/18/2020 Unknown time Active Self  Alcohol Swabs (B-D SINGLE USE SWABS REGULAR) PADS [id number] Yes TEST BLOOD SUGAR TWICE DAILY DX:E11.65 [name], PA-C 05/18/2020 Unknown time Active Self  Ascorbic Acid (VITAMIN C) 1000 MG tablet [id number] Yes Take 1,000 mg by mouth daily. [provider] 05/18/2020 Unknown time Active Self  Cholecalciferol (VITAMIN D) 50 MCG (2000 UT) CAPS [id number] Yes Take 2,000 Units by mouth daily. [provider] 05/18/2020 Unknown time Active Self  DROPLET PEN NEEDLES 32G X 4 MM MISC [id number] Yes 1 Device by Other route daily. Renato Shin, MD 05/18/2020 Unknown time Active Self  Fluticasone-Umeclidin-Vilant (TRELEGY ELLIPTA) 100-62.5-25 MCG/INH AEPB [id number] No Inhale 1 puff into the lungs daily. Do dose the same time of day once daily  Patient taking differently: Inhale 1 puff into the lungs daily as needed (Chest pain).   [name], PA-C Unknown Unknown time Active Self   furosemide (LASIX) 20 MG tablet [id number] Yes Take 1 tablet (20 mg total) by mouth daily as needed for fluid or edema. [name], PA-C 05/18/2020 Unknown time Active Self  hydrochlorothiazide (HYDRODIURIL) 25 MG tablet [id number] Yes TAKE 1 TABLET (25 MG TOTAL) BY MOUTH DAILY. [name], PA-C 05/18/2020 Unknown time Active Self  Insulin NPH, Human,, Isophane, (NOVOLIN N FLEXPEN) 100 UNIT/ML Mayer Masker [id number] Yes Inject 64 Units into the skin every morning. And pen needles (30mm x 32G) 1/day  Patient taking differently: Inject 32 Units into the skin 2 (two) times daily. And pen needles (17mm x 32G) 1/day   Renato Shin, MD 05/19/2020 0500 Active Self           Med Note Jenetta Loges, [place]   Mon May 19, 2020  9:20 AM) Correction:Took 16 units at 0500 today   levothyroxine (SYNTHROID) 25 MCG tablet [id number] Yes TAKE 1 TABLET (25 MCG TOTAL) BY MOUTH DAILY BEFORE BREAKFAST. (DOSE CHANGE)  Patient taking differently: Take 25 mcg by mouth daily before breakfast.   [name], PA-C 05/18/2020 Unknown time Active Self  lisinopril (ZESTRIL) 10 MG tablet [id number] Yes TAKE 1 TABLET EVERY DAY  Patient taking differently: Take 10 mg by mouth daily.   [name], PA-C 05/18/2020 Unknown time Active   meloxicam (MOBIC) 7.5 MG tablet [id number] Yes TAKE 1 TABLET (7.5 MG TOTAL) BY MOUTH DAILY.  Patient taking differently: Take 7.5 mg by mouth daily as needed for pain.   [name], PA-C  Active Self  metFORMIN (GLUCOPHAGE) 1000 MG tablet [id number] Yes TAKE 1 TABLET TWICE DAILY WITH MEALS  Patient taking differently: Take 1,000 mg by mouth 2 (two) times daily with a meal.   [name], PA-C 05/18/2020 Unknown time Active   methocarbamol (ROBAXIN) 500 MG tablet [id number] No TAKE 1 TABLET EVERY 8 HOURS AS NEEDED FOR MUSCLE SPASMS.  Patient taking differently: Take 500 mg by mouth every 8 (eight) hours as needed for muscle spasms.   [name], PA-C Unknown Unknown time Active Self  nystatin (MYCOSTATIN/NYSTOP)  powder [id number] Yes APPLY 1 APPLICATION TOPICALLY TO AFFECTED AREA  THREE TIMES DAILY  Patient taking differently: Apply 1 application topically daily as needed (yeast under belly).   [name], PA-C 05/18/2020 Unknown time Active Self  Omega-3 Fatty Acids (FISH OIL) 1000 MG CAPS [id number] Yes Take 1,000 mg by mouth daily. [provider] 05/18/2020 Unknown time Active Self  pantoprazole (PROTONIX) 40 MG tablet [id number] Yes TAKE 1 TABLET EVERY DAY  Patient taking differently: Take 40 mg by mouth daily.   [name], PA-C 05/18/2020 Unknown time Active Self  potassium chloride SA (KLOR-CON) 20 MEQ tablet [id number] Yes Take 1 tablet (20 mEq total) by mouth daily. [name], PA-C 05/18/2020 Unknown time Active Self  rosuvastatin (CRESTOR) 20 MG tablet [id number] Yes TAKE 1 TABLET (20 MG TOTAL) BY MOUTH AT BEDTIME. [name], PA-C 05/18/2020 Unknown time Active Self  tiZANidine (ZANAFLEX) 4 MG tablet [id number] No TAKE 1 TABLET (4 MG TOTAL) BY MOUTH EVERY 8 (EIGHT) HOURS AS NEEDED FOR MUSCLE SPASMS. [name], PA-C Unknown Unknown time Active Self          Patient Active Problem List   Diagnosis Date Noted  . 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 Chronic cough 05/13/2020  . COPD (chronic obstructive pulmonary disease) ([place]) 05/13/2020  . 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 Anemia of chronic disease 09/05/2017  . Colon polyps 09/05/2017  . Diabetes mellitus type 2 with complications, uncontrolled ([place]) 07/14/2016  . Hypertension 07/14/2016  . Hyperlipemia 07/14/2016  . Bilateral lower extremity edema 07/14/2016  . GERD (gastroesophageal reflux disease) 07/14/2016  . Screening for colorectal cancer 07/14/2016    Immunization History  Administered Date(s) Administered  . Fluad Quad(high Dose 65+) 09/20/2018, 10/16/2019  . Influenza, High Dose Seasonal PF 10/16/2019  . Influenza,inj,Quad PF,6+ Mos  10/18/2016, 09/29/2017  . Pneumococcal Conjugate-13 10/16/2019  . Pneumococcal Polysaccharide-23 03/26/2013    Conditions to be addressed/monitored:  Hypertension, Hyperlipidemia, Diabetes, GERD and COPD  Care Plan : General Pharmacy (Adult)  Updates made by Germaine Pomfret, [place] since 05/28/2020 12:00 AM    Problem: Hypertension, Hyperlipidemia, Diabetes, GERD and COPD   Priority: High    Long-Range Goal: Patient-Specific Goal   Start Date: 05/28/2020  Expected End Date: 11/28/2020  This Visit's Progress: On track  Priority: High  Note:   Current Barriers:  . Unable to achieve control of Breathing  . Unable to maintain control of blood pressure  Pharmacist Clinical Goal(s):  Marland Kitchen Patient will achieve control of COPD as evidenced by stable breathing per patient report . maintain control of blood pressure as evidenced by BP less than 140/90  through collaboration with PharmD and provider.   Interventions: . 1:1 collaboration with [name], PA-C regarding development and update of comprehensive plan of care as evidenced by provider attestation and co-signature . Inter-disciplinary care team collaboration (see longitudinal plan of care) . Comprehensive medication review performed; medication list updated in electronic medical record  Hypertension (BP goal <140/90) -Not ideally controlled -Current treatment: . Furosmide 20 mg daily as needed . Hydrochlorothiazide 25 mg daily  . Lisinopril 10 mg daily  -Medications previously tried: NA  -Patient with chronic, wet cough for 2+ years. Likely more related to pulmonary issues, but could consider switching to ARB to see if cough improves  -Current home readings: Has not been routinely monitoring -Denies hypotensive/hypertensive symptoms -Educated on Daily salt intake goal < 2300 mg; Importance of home blood pressure monitoring; -Counseled to monitor  BP at home three times weekly, document, and provide log at future  appointments -Recommended stopping lisinopril due to possible influence on chronic cough  -Recommended Olmesartan 20 mg daily (higher equivalent dose but patient has had elevated readings at past doctor's visits)  -Recommend BMP at next PCP follow-up.   Hyperlipidemia: (LDL goal < 100) -Controlled -Current treatment: . Rosuvastatin 20 mg daily  -Medications previously tried: NA  -Educated on Importance of limiting foods high in cholesterol; -Recommended to continue current medication  Diabetes (A1c goal <8%) -Controlled -Current medications: Marland Kitchen Metformin 1000 mg twice daily  . NPH 32 units twice daily  -Medications previously tried: NA  -Current home glucose readings . fasting glucose: 95,99, 101 (prior to surgery), 177, 170, 149  . post prandial glucose: NA -Denies hypoglycemic/hyperglycemic symptoms -Educated on Benefits of routine self-monitoring of blood sugar; -Counseled to check feet daily and get yearly eye exams -Recommended to continue current medication  COPD (Goal: control symptoms and prevent exacerbations) -Uncontrolled -Current treatment  . None -Medications previously tried: Trelegy (worsened cough)  -Gold Grade: NA -Current COPD Classification:  B (high sx, <2 exacerbations/yr) -MMRC score: 2-3 per patient report. Gets short of breath walking within house, or walking to mailbox and back. Resolves with rest. -Pulmonary function testing: NA -Exacerbations requiring treatment in last 6 months: None -Patient denies consistent use of maintenance inhaler -Frequency of rescue inhaler use: NA -Counseled on Benefits of consistent maintenance inhaler use  -Patient would likely benefit from long-acting inhaler with different delivery mechanism that does not cause as much irritation.  -Recommended starting Stiolto 2 inhalations once daily (with PAP) -Recommend PFTs.   Hypothyroidism (Goal: Maintain stable thyroid function) -Controlled -Current treatment   . Levothyroxine 25 mcg daily  -Medications previously tried: NA  -Recommended to continue current medication  GERD (Goal: Prevent heartburn/reflux) -Controlled -Current treatment  . Pantoprazole 40 mg daily  -Medications previously tried: NA -Patient with chronic cough of unclear etiology. Per patient reflux symptoms have been well controlled.    -Recommended to continue current medication  Patient Goals/Self-Care Activities . Patient will:  - check glucose twice daily; before breakfast and supper, document, and provide at future appointments check blood pressure three times weekly, document, and provide at future appointments  Follow Up Plan: Telephone follow up appointment with care management team member scheduled for:  07/09/2020 at 11:00 AM      Medication Assistance: Will determine if patient assistance necessary for inhaler treatments  Patient's preferred pharmacy is:  [facility], [place] [address] Phone: [phone] Fax: [phone]  [facility] [address], [place] [address] Phone: [phone] Fax: [phone]  Uses pill box? Yes Pt endorses 100% compliance  We discussed: Current pharmacy is preferred with insurance plan and patient is satisfied with pharmacy services Patient decided to: Continue current medication management strategy  Care Plan and Follow Up Patient Decision:  Patient agrees to Care Plan and Follow-up.  Plan: Telephone follow up appointment with care management team member scheduled for:  07/09/2020 at 11:00 AM  Doristine Section, [facility] [phone]

## 2020-05-29 DIAGNOSIS — R918 Other nonspecific abnormal finding of lung field: Secondary | ICD-10-CM | POA: Diagnosis not present

## 2020-06-03 ENCOUNTER — Other Ambulatory Visit: Payer: Self-pay

## 2020-06-03 ENCOUNTER — Encounter: Payer: Self-pay | Admitting: Emergency Medicine

## 2020-06-03 ENCOUNTER — Ambulatory Visit: Payer: Medicare HMO | Admitting: Emergency Medicine

## 2020-06-03 VITALS — BP 126/70 | HR 92 | Temp 97.1°F | Ht 66.0 in | Wt 238.6 lb

## 2020-06-03 DIAGNOSIS — R918 Other nonspecific abnormal finding of lung field: Secondary | ICD-10-CM

## 2020-06-03 DIAGNOSIS — J984 Other disorders of lung: Secondary | ICD-10-CM

## 2020-06-03 DIAGNOSIS — R053 Chronic cough: Secondary | ICD-10-CM | POA: Diagnosis not present

## 2020-06-03 MED ORDER — LEVOFLOXACIN 500 MG PO TABS: 500.0000 mg | ORAL_TABLET | Freq: Every day | ORAL | 0 refills | Status: DC

## 2020-06-03 MED ORDER — VALSARTAN 80 MG PO TABS: 80.0000 mg | ORAL_TABLET | Freq: Every day | ORAL | 1 refills | Status: DC

## 2020-06-03 NOTE — Patient Instructions (Signed)
Please stop your lisinopril. Please start valsartan 80 mg once daily. You may want to stop taking your fish oil as it can sometimes be associated with silent reflux and coughing. Please take levofloxacin 500 mg once daily for the next 10 days. We will arrange for PET scan to be performed after you have completed your antibiotics to evaluate your left lower lobe abnormality Follow Dr. Lamonte Sakai next available after the PET scan.  Depending on the results we may decide to arrange for a repeat biopsy of your left lower lobe

## 2020-06-03 NOTE — Progress Notes (Signed)
Subjective:    Patient ID: Tamara [name], female    DOB: 11-06-1945, 75 y.o.   MRN: [id number]  HPI 75 year old former smoker (40 pack years) with diabetes, hypertension, hyperlipidemia, GERD.  She has been seen by Dr. Chase Caller for chronic cough with some associated shortness of breath.  The cough seems to start after a viral process in January 2020, question COVID-19 (not confirmed).  She was started prior to pulmonary evaluation on Trelegy.  PFTs ordered but not yet done.  She is on lisinopril, fish oil  A CT scan of her chest done on 04/16/2020 reviewed by me shows no mediastinal or hilar adenopathy but very dense medial posterior left lower lobe opacity 7.8 cm in largest dimension with some air bronchograms and evidence of internal cavitation  Repeat super D CT done on 05/09/2020 reviewed by me, shows that the medial left lower lobe opacity is unchanged.  Note was also made of a right adrenal adenoma.  ROV 06/03/20 --follow-up visit for 75 year old former smoker.  She also has diabetes, hypertension, hyperlipidemia, GERD.  She has dense left lower lobe consolidation concerning for malignancy and underwent bronchoscopy on 05/19/2020.  We obtained atypical cells but it was not definitive for malignancy.    Review of Systems As per HPI  Past Medical History:  Diagnosis Date  . Arthritis    knee and shoulders  . Diabetes mellitus without complication ([place])    type II  . Dyspnea    05/16/20 has had a cough for 2.5 years post Covid  . Gastritis 09/05/2017  . GERD (gastroesophageal reflux disease)   . Hyperlipidemia   . Hypertension    patient denies  . Hypothyroidism   . Panic attack   . RUQ pain 03/09/2017   Per patient, she has had RUQ pain 4-5 years that has grown worse in the last few months.     Family History  Problem Relation Age of Onset  . Diabetes Maternal Grandmother   . Breast cancer Neg Hx      Social History   Socioeconomic History  . Marital status: Married     Spouse name: Not on file  . Number of children: 3  . Years of education: Not on file  . Highest education level: High school graduate  Occupational History  . Occupation: retired  Tobacco Use  . Smoking status: Former Smoker    Packs/day: 1.00    Years: 40.00    Pack years: 40.00    Types: Cigarettes    Quit date: 2006    Years since quitting: 16.3  . Smokeless tobacco: Never Used  Vaping Use  . Vaping Use: Never used  Substance and Sexual Activity  . Alcohol use: No  . Drug use: No  . Sexual activity: Not on file  Other Topics Concern  . Not on file  Social History Narrative  . Not on file   Social Determinants of Health   Financial Resource Strain: Medium Risk  . Difficulty of Paying Living Expenses: Somewhat hard  Food Insecurity: No Food Insecurity  . Worried About Charity fundraiser in the Last Year: Never true  . Ran Out of Food in the Last Year: Never true  Transportation Needs: No Transportation Needs  . Lack of Transportation (Medical): No  . Lack of Transportation (Non-Medical): No  Physical Activity: Inactive  . Days of Exercise per Week: 0 days  . Minutes of Exercise per Session: 0 min  Stress: No Stress Concern Present  .  Feeling of Stress : Not at all  Social Connections: Moderately Isolated  . Frequency of Communication with Friends and Family: More than three times a week  . Frequency of Social Gatherings with Friends and Family: More than three times a week  . Attends Religious Services: Never  . Active Member of Clubs or Organizations: No  . Attends Archivist Meetings: Never  . Marital Status: Married  Human resources officer Violence: Not At Risk  . Fear of Current or Ex-Partner: No  . Emotionally Abused: No  . Physically Abused: No  . Sexually Abused: No     Allergies  Allergen Reactions  . Penicillins Hives    Reaction: 1965     Outpatient Medications Prior to Visit  Medication Sig Dispense Refill  . Accu-Chek Softclix  Lancets lancets TEST BLOOD SUGAR TWICE DAILY. 200 each 11  . Alcohol Swabs (B-D SINGLE USE SWABS REGULAR) PADS TEST BLOOD SUGAR TWICE DAILY DX:E11.65 100 each 5  . Ascorbic Acid (VITAMIN C) 1000 MG tablet Take 1,000 mg by mouth daily.    . Cholecalciferol (VITAMIN D) 50 MCG (2000 UT) CAPS Take 2,000 Units by mouth daily.    . DROPLET PEN NEEDLES 32G X 4 MM MISC 1 Device by Other route daily. 100 each 3  . furosemide (LASIX) 20 MG tablet Take 1 tablet (20 mg total) by mouth daily as needed for fluid or edema. 30 tablet 1  . hydrochlorothiazide (HYDRODIURIL) 25 MG tablet TAKE 1 TABLET (25 MG TOTAL) BY MOUTH DAILY. 90 tablet 3  . Insulin NPH, Human,, Isophane, (NOVOLIN N FLEXPEN) 100 UNIT/ML Kiwkpen Inject 32 Units into the skin 2 (two) times daily. And pen needles (25mm x 32G) 1/day    . levothyroxine (SYNTHROID) 25 MCG tablet TAKE 1 TABLET (25 MCG TOTAL) BY MOUTH DAILY BEFORE BREAKFAST. (DOSE CHANGE) 90 tablet 3  . meloxicam (MOBIC) 7.5 MG tablet Take 1 tablet (7.5 mg total) by mouth daily as needed for pain.    . metFORMIN (GLUCOPHAGE) 1000 MG tablet TAKE 1 TABLET TWICE DAILY WITH MEALS 180 tablet 1  . methocarbamol (ROBAXIN) 500 MG tablet TAKE 1 TABLET EVERY 8 HOURS AS NEEDED FOR MUSCLE SPASMS. 30 tablet 1  . nystatin (MYCOSTATIN/NYSTOP) powder Apply 1 application topically daily as needed (yeast under belly).    . Omega-3 Fatty Acids (FISH OIL) 1000 MG CAPS Take 1,000 mg by mouth daily.    . pantoprazole (PROTONIX) 40 MG tablet TAKE 1 TABLET EVERY DAY 90 tablet 3  . potassium chloride SA (KLOR-CON) 20 MEQ tablet Take 1 tablet (20 mEq total) by mouth daily. 90 tablet 1  . rosuvastatin (CRESTOR) 20 MG tablet TAKE 1 TABLET (20 MG TOTAL) BY MOUTH AT BEDTIME. 90 tablet 3  . tiZANidine (ZANAFLEX) 4 MG tablet TAKE 1 TABLET (4 MG TOTAL) BY MOUTH EVERY 8 (EIGHT) HOURS AS NEEDED FOR MUSCLE SPASMS. 30 tablet 0  . lisinopril (ZESTRIL) 10 MG tablet TAKE 1 TABLET EVERY DAY 90 tablet 3   No  facility-administered medications prior to visit.        Objective:   Physical Exam Vitals:   06/03/20 1611  BP: 126/70  Pulse: 92  Temp: (!) 97.1 F (36.2 C)  TempSrc: Temporal  SpO2: 97%  Weight: 238 lb 9.6 oz (108.2 kg)  Height: 5\' 6"  (1.676 m)   Gen: Pleasant, well-nourished, in no distress,  normal affect  ENT: No lesions,  mouth clear,  oropharynx clear, no postnasal drip  Neck: No JVD, no stridor  Lungs: No use of accessory muscles, no crackles or wheezing on normal respiration, no wheeze on forced expiration  Cardiovascular: RRR, heart sounds normal, no murmur or gallops, no peripheral edem  Musculoskeletal: No deformities, no cyanosis or clubbing  Neuro: alert, awake, non focal  Skin: Warm, no lesions or rash     Assessment & Plan:  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  The left lower lobe continues to have consolidation on follow-up chest x-ray.  She tells me that she is never been treated definitively for possible pneumonia and I will give her levofloxacin for 10 days.  Also I am I am still concerned about possible undiagnosed malignancy.  After the antibiotics we will obtain a PET scan, see if any areas remain hypermetabolic.  If so then she likely needs a TTNA and I will help facilitate.  Please take levofloxacin 500 mg once daily for the next 10 days. We will arrange for PET scan to be performed after you have completed your antibiotics to evaluate your left lower lobe abnormality Follow Dr. Lamonte Sakai next available after the PET scan.  Depending on the results we may decide to arrange for a repeat biopsy of your left lower lobe  Chronic cough Suspect that this relates principally to the left lower lobe abnormality but she is on lisinopril, takes fish oil.  We will try stopping both to see if this makes an impact.  Please stop your lisinopril. Please start valsartan  80 mg once daily. You may want to stop taking your fish oil as it can sometimes be associated with silent reflux and coughing.    Baltazar Apo, MD, PhD 06/04/2020, 2:30 PM [facility] [phone] or if no answer before 7:00PM call [phone] For any issues after 7:00PM please call eLink [phone]

## 2020-06-04 ENCOUNTER — Encounter: Payer: Self-pay | Admitting: Emergency Medicine

## 2020-06-04 NOTE — Assessment & Plan Note (Signed)
We obtained atypical cells on transbronchial biopsies, nothing definitive for malignancy.  The left lower lobe continues to have consolidation on follow-up chest x-ray.  She tells me that she is never been treated definitively for possible pneumonia and I will give her levofloxacin for 10 days.  Also I am I am still concerned about possible undiagnosed malignancy.  After the antibiotics we will obtain a PET scan, see if any areas remain hypermetabolic.  If so then she likely needs a TTNA and I will help facilitate.  Please take levofloxacin 500 mg once daily for the next 10 days. We will arrange for PET scan to be performed after you have completed your antibiotics to evaluate your left lower lobe abnormality Follow Dr. Lamonte Sakai next available after the PET scan.  Depending on the results we may decide to arrange for a repeat biopsy of your left lower lobe

## 2020-06-04 NOTE — Assessment & Plan Note (Signed)
Suspect that this relates principally to the left lower lobe abnormality but she is on lisinopril, takes fish oil.  We will try stopping both to see if this makes an impact.  Please stop your lisinopril. Please start valsartan 80 mg once daily. You may want to stop taking your fish oil as it can sometimes be associated with silent reflux and coughing.

## 2020-06-09 ENCOUNTER — Telehealth: Payer: Self-pay | Admitting: Emergency Medicine

## 2020-06-09 NOTE — Telephone Encounter (Signed)
Pt sched for 6/8 at 7:00; 6:30am arrival time @ [facility]. NPO after midnight, No carbs/sugar for dinner the night before. No insulin 6hrs prior.  Informed pt.

## 2020-06-18 ENCOUNTER — Ambulatory Visit: Payer: Medicare HMO | Admitting: Emergency Medicine

## 2020-06-25 ENCOUNTER — Other Ambulatory Visit: Payer: Self-pay

## 2020-06-25 ENCOUNTER — Ambulatory Visit (HOSPITAL_COMMUNITY)
Admission: RE | Admit: 2020-06-25 | Discharge: 2020-06-25 | Disposition: A | Discharge status: Home / Self Care | Payer: Medicare HMO | Source: Ambulatory Visit | Attending: Emergency Medicine | Admitting: Emergency Medicine

## 2020-06-25 DIAGNOSIS — J984 Other disorders of lung: Secondary | ICD-10-CM | POA: Diagnosis not present

## 2020-06-25 DIAGNOSIS — R918 Other nonspecific abnormal finding of lung field: Secondary | ICD-10-CM | POA: Diagnosis not present

## 2020-06-25 DIAGNOSIS — J181 Lobar pneumonia, unspecified organism: Secondary | ICD-10-CM | POA: Diagnosis not present

## 2020-06-25 LAB — GLUCOSE, CAPILLARY: Glucose-Capillary: 173 mg/dL — ABNORMAL HIGH (ref 70–99)

## 2020-06-25 MED ORDER — FLUDEOXYGLUCOSE F - 18 (FDG) INJECTION: 11.6000 | Freq: Once | INTRAVENOUS | Status: DC | PRN

## 2020-06-27 ENCOUNTER — Encounter: Payer: Self-pay | Admitting: Emergency Medicine

## 2020-06-27 ENCOUNTER — Ambulatory Visit (INDEPENDENT_AMBULATORY_CARE_PROVIDER_SITE_OTHER): Payer: Medicare HMO | Admitting: Emergency Medicine

## 2020-06-27 ENCOUNTER — Other Ambulatory Visit: Payer: Self-pay

## 2020-06-27 DIAGNOSIS — J449 Chronic obstructive pulmonary disease, unspecified: Secondary | ICD-10-CM

## 2020-06-27 DIAGNOSIS — J984 Other disorders of lung: Secondary | ICD-10-CM

## 2020-06-27 DIAGNOSIS — R053 Chronic cough: Secondary | ICD-10-CM

## 2020-06-27 MED ORDER — LEVOFLOXACIN 500 MG PO TABS
500.0000 mg | ORAL_TABLET | Freq: Every day | ORAL | 0 refills | Status: DC
Start: 2020-06-27 — End: 2020-07-28

## 2020-06-27 NOTE — Assessment & Plan Note (Signed)
Continue Trelegy.  We will perform pulmonary function testing at some point in the future.  We can discuss this next time.

## 2020-06-27 NOTE — Assessment & Plan Note (Signed)
Her bronchoscopy was negative, she had a clinical response to Levaquin and now her PET scan does not show any significant hypermetabolism.  This is becoming more consistent with a partially treated left lower lobe pneumonia with associated scar and rounded atelectasis.  She still coughing and making yellow mucus.  I think will be reasonable to treat her with an extended course of Levaquin to see if she gets more improvement.  We will plan to follow her CT chest for interval stability in 6 months.

## 2020-06-27 NOTE — Patient Instructions (Signed)
Your PET scan today is reassuring.  The opacity at the bottom of the left lung has not changed in size.  It is not hypermetabolic on the PET scan.  This could be consistent with a resolving pneumonia and associated scar. We will plan to repeat your CT scan of the chest in 6 months.  We can schedule this next time. Please take Levaquin 500 mg daily for another 14 days and then stop. Stop your lisinopril Start valsartan 80 mg once daily.  Hopefully changing this medication will improve your cough. Continue Trelegy 1 inhalation once daily.  Rinse and gargle after using. Follow with Dr Lamonte Sakai in 1 month or next available

## 2020-06-27 NOTE — Progress Notes (Signed)
Subjective:    Patient ID: Tamara [name], female    DOB: [birth year], 75 y.o.   MRN: [id number]  HPI 75 year old former smoker (40 pack years) with diabetes, hypertension, hyperlipidemia, GERD.  She has been seen by Dr. Chase Caller for chronic cough with some associated shortness of breath.  The cough seems to start after a viral process in January 2020, question COVID-19 (not confirmed).  She was started prior to pulmonary evaluation on Trelegy.  PFTs ordered but not yet done.  She is on lisinopril, fish oil  A CT scan of her chest done on 04/16/2020 reviewed by me shows no mediastinal or hilar adenopathy but very dense medial posterior left lower lobe opacity 7.8 cm in largest dimension with some air bronchograms and evidence of internal cavitation  Repeat super D CT done on 05/09/2020 reviewed by me, shows that the medial left lower lobe opacity is unchanged.  Note was also made of a right adrenal adenoma.  ROV 06/03/20 --follow-up visit for 75 year old former smoker.  She also has diabetes, hypertension, hyperlipidemia, GERD.  She has dense left lower lobe consolidation concerning for malignancy and underwent bronchoscopy on 05/19/2020.  We obtained atypical cells but it was not definitive for malignancy.  ROV 06/27/20 --Tamara [name] is 55, former smoker, with a history of chronic cough and probable obstructive lung disease.  I been following her for an abnormal CT scan of the chest that shows a medial left lower lobe opacity.  She underwent bronchoscopy a month ago and we obtained atypical cells but there was nothing definitive for malignancy.  I started her on levofloxacin for 10 days at our last visit to treat possible pneumonia.  She subsequently underwent PET scan 06/25/2020 as below.  Cultures on the bronchoscopy from were negative, with final AFB still pending.  She feels better. Less cough although she is still clearing some yellow mucous.  She never stopped her lisinopril to stop the valsartan  because she was worried about the milligram amount.  We talked about this today.  PET scan 06/25/2020 reviewed by me, shows that the left lower lobe masslike consolidation persists, is unchanged 6.9 x 4.2 cm with some central cavitation and air bronchograms.  There is only mild hypermetabolism (SUV max 3.3).    Review of Systems As per HPI         Objective:   Physical Exam Vitals:   06/27/20 1359  BP: 124/72  Pulse: 86  Temp: (!) 97.3 F (36.3 C)  TempSrc: Temporal  SpO2: 97%  Weight: 238 lb 9.6 oz (108.2 kg)  Height: 5\' 6"  (1.676 m)   Gen: Pleasant, well-nourished, in no distress,  normal affect  ENT: No lesions,  mouth clear,  oropharynx clear, no postnasal drip  Neck: No JVD, no stridor  Lungs: No use of accessory muscles, no crackles or wheezing on normal respiration, no wheeze on forced expiration  Cardiovascular: RRR, heart sounds normal, no murmur or gallops, no peripheral edem  Musculoskeletal: No deformities, no cyanosis or clubbing  Neuro: alert, awake, non focal  Skin: Warm, no lesions or rash     Assessment & Plan:  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  This is becoming more consistent with a partially treated left lower lobe pneumonia with associated scar and rounded atelectasis.  She still coughing and making yellow mucus.  I think will  be reasonable to treat her with an extended course of Levaquin to see if she gets more improvement.  We will plan to follow her CT chest for interval stability in 6 months.  Chronic cough She never changed the ACE inhibitor to valsartan because she was worried about the milligram amount.  I reassured her about this today and we will try getting her on valsartan 80 mg once daily.  COPD (chronic obstructive pulmonary disease) ([place]) Continue Trelegy.  We will perform pulmonary  function testing at some point in the future.  We can discuss this next time.    Baltazar Apo, MD, PhD 06/27/2020, 2:25 PM [facility] [phone] or if no answer before 7:00PM call [phone] For any issues after 7:00PM please call eLink [phone]

## 2020-06-27 NOTE — Assessment & Plan Note (Signed)
She never changed the ACE inhibitor to valsartan because she was worried about the milligram amount.  I reassured her about this today and we will try getting her on valsartan 80 mg once daily.

## 2020-07-02 LAB — ACID FAST CULTURE WITH REFLEXED SENSITIVITIES (MYCOBACTERIA): Acid Fast Culture: NEGATIVE

## 2020-07-03 ENCOUNTER — Other Ambulatory Visit: Payer: Self-pay | Admitting: Family Medicine

## 2020-07-08 ENCOUNTER — Telehealth: Payer: Self-pay

## 2020-07-08 NOTE — Progress Notes (Signed)
Left voice message to confirmed patient telephone appointment on 07/09/2020 for CCM at 11:00 am with Junius Argyle the Clinical pharmacist. Left message to have all medications, supplements, blood pressure and blood sugar logs available during appointment and to return call if need to reschedule.  Star Rating Drug: Valsartan 80 mg not on filled history Metformin 1000 last filled on 05/12/2020 for 90 day supply at [facility]. Rosuvastatin 20 mg last filled on 05/07/2020 for 90 day supply at [facility].  [facility] Pharmacist Assistant [phone]

## 2020-07-09 ENCOUNTER — Other Ambulatory Visit: Payer: Self-pay | Admitting: Family Medicine

## 2020-07-09 ENCOUNTER — Telehealth: Payer: Self-pay | Admitting: Family Medicine

## 2020-07-09 ENCOUNTER — Telehealth: Payer: Self-pay

## 2020-07-09 NOTE — Telephone Encounter (Signed)
Patient called to reschedule her appt. That she had today with Cristie Hem.  She stated that she forgot about appt.  Please advise and call patient at [phone]

## 2020-07-09 NOTE — Progress Notes (Deleted)
Chronic Care Management Pharmacy Note  07/09/2020 Name:  Tamara [name] MRN:  [id number] DOB:  30-Apr-1945  Subjective: Tamara [name] is an 75 y.o. year old female who is a primary patient of [name], [place].  The CCM team was consulted for assistance with disease management and care coordination needs.    Engaged with patient by telephone for follow up visit in response to provider referral for pharmacy case management and/or care coordination services.   Consent to Services:  The patient was given information about Chronic Care Management services, agreed to services, and gave verbal consent prior to initiation of services.  Please see initial visit note for detailed documentation.   Patient Care Team: [name], PA-C as PCP - General (Family Medicine) Germaine Pomfret, [facility] as Pharmacist (Pharmacist)  Recent office visits: 01/28/2020: Patient presented to [name], PA-C for follow-up. Trelegy, Fluconazole started.   Recent consult visits:  06/27/20: Patient presented to Dr. Lamonte Sakai (Pulmonology) for follow-up. Valsartan 80 mg daily. Extended levaquin course for possible LLL Pneumonia  06/03/20: Patient presented to Dr. Lamonte Sakai (Pulmonology) for follow-up. Levaquin + valsartan started. Lisinopril stopped.  05/13/20: Patient presented to Dr. Lamonte Sakai (Pulmonology) for mass of left lung. Trelegy stopped due to worsening cough. Bronchoscopy planned.  04/08/20: Patient presented to Dr. Chase Caller (Pulmonology) for chronic cough. No medication changes made.   Hospital visits: 05/19/20: Patient hospitalized for bronchoscopy procedure. Atypical cells, but not maglinant. Repeat testing necessary.  03/19/20: Patient presented to Dr. Loanne Drilling (Endocrinology) for follow-up. A1c 7.5%. No medication changes made.   Objective:  Lab Results  Component Value Date   CREATININE 0.95 05/13/2020   BUN 14 05/13/2020   GFR 59.43 (L) 05/13/2020   GFRNONAA 63 11/15/2019   GFRAA 73 11/15/2019   NA  138 05/13/2020   K 4.3 05/13/2020   CALCIUM 10.0 05/13/2020   CO2 27 05/13/2020   GLUCOSE 139 (H) 05/13/2020    Lab Results  Component Value Date/Time   HGBA1C 7.5 (A) 03/19/2020 01:27 PM   HGBA1C 8.0 (A) 12/19/2019 01:42 PM   HGBA1C 10.8 (H) 03/22/2019 10:20 AM   HGBA1C 8.2 (H) 11/22/2018 12:00 AM   GFR 59.43 (L) 05/13/2020 02:38 PM   MICROALBUR 0.5 09/05/2017 08:17 AM   MICROALBUR 0.2 08/23/2016 08:25 AM    Last diabetic Eye exam:  Lab Results  Component Value Date/Time   HMDIABEYEEXA No Retinopathy 12/29/2017 12:00 AM    Last diabetic Foot exam: No results found for: HMDIABFOOTEX   Lab Results  Component Value Date   CHOL 152 03/22/2019   HDL 42 (L) 03/22/2019   LDLCALC 82 03/22/2019   TRIG 189 (H) 03/22/2019   CHOLHDL 3.6 03/22/2019    Hepatic Function Latest Ref Rng & Units 11/15/2019 03/22/2019 11/22/2018  Total Protein 6.1 - 8.1 g/dL 6.9 6.7 7.1  Albumin 3.6 - 5.1 g/dL - - -  AST 10 - 35 U/L _0 ALT 6 - 29 U/L _1 Alk Phosphatase 33 - 130 U/L - - -  Total Bilirubin 0.2 - 1.2 mg/dL 0.4 0.4 0.4  Bilirubin, Direct 0.0 - 0.2 mg/dL - - -    Lab Results  Component Value Date/Time   TSH 2.24 11/15/2019 03:23 PM   TSH 2.45 03/22/2019 10:20 AM   FREET4 1.2 11/22/2018 12:00 AM    CBC Latest Ref Rng & Units 05/13/2020 04/08/2020 11/15/2019  WBC 4.0 - 10.5 K/uL 7.6 7.1 6.0  Hemoglobin 12.0 - 15.0 g/dL 11.1(L) 11.2(L)  10.9(L)  Hematocrit 36.0 - 46.0 % 33.1(L) 33.3(L) 33.1(L)  Platelets 150.0 - 400.0 K/uL 159.0 154.0 157    No results found for: VD25OH  Clinical ASCVD: No  The 10-year ASCVD risk score Mikey Bussing DC Jr., et al., 2013) is: 28.3%   Values used to calculate the score:     Age: 79 years     Sex: Female     Is Non-Hispanic African American: No     Diabetic: Yes     Tobacco smoker: No     Systolic Blood Pressure: 270 mmHg     Is BP treated: Yes     HDL Cholesterol: 42 mg/dL     Total Cholesterol: 152 mg/dL    Depression screen [facility] 2/9  01/28/2020 11/15/2019 10/16/2019  Decreased Interest 0 0 0  Down, Depressed, Hopeless 0 0 0  PHQ - 2 Score 0 0 0  Altered sleeping - - -  Tired, decreased energy - - -  Change in appetite - - -  Feeling bad or failure about yourself  - - -  Trouble concentrating - - -  Moving slowly or fidgety/restless - - -  Suicidal thoughts - - -  PHQ-9 Score - - -  Difficult doing work/chores - - -     Social History   Tobacco Use  Smoking Status Former   Packs/day: 1.00   Years: 40.00   Pack years: 40.00   Types: Cigarettes   Quit date: 2006   Years since quitting: 16.4  Smokeless Tobacco Never   BP Readings from Last 3 Encounters:  06/27/20 124/72  06/03/20 126/70  05/19/20 138/60   Pulse Readings from Last 3 Encounters:  06/27/20 86  06/03/20 92  05/19/20 85   Wt Readings from Last 3 Encounters:  06/27/20 238 lb 9.6 oz (108.2 kg)  06/03/20 238 lb 9.6 oz (108.2 kg)  05/19/20 240 lb (108.9 kg)   BMI Readings from Last 3 Encounters:  06/27/20 38.51 kg/m  06/03/20 38.51 kg/m  05/19/20 38.74 kg/m    Assessment/Interventions: Review of patient past medical history, allergies, medications, health status, including review of consultants reports, laboratory and other test data, was performed as part of comprehensive evaluation and provision of chronic care management services.   SDOH:  (Social Determinants of Health) assessments and interventions performed: Yes   SDOH Screenings   Alcohol Screen: Low Risk    Last Alcohol Screening Score (AUDIT): 0  Depression (PHQ2-9): Low Risk    PHQ-2 Score: 0  Financial Resource Strain: Medium Risk   Difficulty of Paying Living Expenses: Somewhat hard  Food Insecurity: No Food Insecurity   Worried About Charity fundraiser in the Last Year: Never true   Ran Out of Food in the Last Year: Never true  Housing: Low Risk    Last Housing Risk Score: 0  Physical Activity: Inactive   Days of Exercise per Week: 0 days   Minutes of Exercise  per Session: 0 min  Social Connections: Moderately Isolated   Frequency of Communication with Friends and Family: More than three times a week   Frequency of Social Gatherings with Friends and Family: More than three times a week   Attends Religious Services: Never   Marine scientist or Organizations: No   Attends Archivist Meetings: Never   Marital Status: Married  Stress: No Stress Concern Present   Feeling of Stress : Not at all  Tobacco Use: Medium Risk   Smoking Tobacco Use: Former  Smokeless Tobacco Use: Never  Transportation Needs: No Transportation Needs   Lack of Transportation (Medical): No   Lack of Transportation (Non-Medical): No    CCM Care Plan  Allergies  Allergen Reactions   Penicillins Hives    Reaction: 1965    Medications Reviewed Today     Reviewed by Collene Gobble, MD (Physician) on 06/27/20 at 1414  Med List Status: <None>   Medication Order Taking? Sig Documenting Provider Last Dose Status Informant  Accu-Chek Softclix Lancets lancets [id number] Yes TEST BLOOD SUGAR TWICE DAILY. [name], PA-C Taking Active Self  Alcohol Swabs (B-D SINGLE USE SWABS REGULAR) PADS [id number] Yes TEST BLOOD SUGAR TWICE DAILY DX:E11.65 [name], PA-C Taking Active Self  Ascorbic Acid (VITAMIN C) 1000 MG tablet [id number] Yes Take 1,000 mg by mouth daily. [provider] Taking Active Self  Cholecalciferol (VITAMIN D) 50 MCG (2000 UT) CAPS [id number] Yes Take 2,000 Units by mouth daily. [provider] Taking Active Self  DROPLET PEN NEEDLES 32G X 4 MM MISC [id number] Yes 1 Device by Other route daily. Renato Shin, MD Taking Active Self  fludeoxyglucose F - 18 (FDG) injection 29.5 millicurie [id number]   Abigail Miyamoto, MD  Active   furosemide (LASIX) 20 MG tablet [id number] Yes Take 1 tablet (20 mg total) by mouth daily as needed for fluid or edema. [name], PA-C Taking Active Self  hydrochlorothiazide (HYDRODIURIL) 25 MG tablet  [id number] Yes TAKE 1 TABLET (25 MG TOTAL) BY MOUTH DAILY. [name], PA-C Taking Active Self  Insulin NPH, Human,, Isophane, (NOVOLIN N FLEXPEN) 100 UNIT/ML Mayer Masker [id number] Yes Inject 32 Units into the skin 2 (two) times daily. And pen needles (88m x 32G) 1/day BCollene Gobble MD Taking Active     Discontinued 06/27/20 1400   levothyroxine (SYNTHROID) 25 MCG tablet [id number] TAKE 1 TABLET (25 MCG TOTAL) BY MOUTH DAILY BEFORE BREAKFAST. (DOSE CHANGE) TDelsa Grana PA-C Taking Active Self  meloxicam (MOBIC) 7.5 MG tablet [id number] Take 1 tablet (7.5 mg total) by mouth daily as needed for pain. BCollene Gobble MD Taking Active   metFORMIN (GLUCOPHAGE) 1000 MG tablet [id number] TAKE 1 TABLET TWICE DAILY WITH MEALS TDelsa Grana PA-C Taking Active   methocarbamol (ROBAXIN) 500 MG tablet [id number] TAKE 1 TABLET EVERY 8 HOURS AS NEEDED FOR MUSCLE SPASMS. TDelsa Grana PA-C Taking Active Self  nystatin (MYCOSTATIN/NYSTOP) powder [id number] Apply 1 application topically daily as needed (yeast under belly). BCollene Gobble MD Taking Active   Omega-3 Fatty Acids (FISH OIL) 1000 MG CAPS [id number] Take 1,000 mg by mouth daily. [provider] Taking Active Self  pantoprazole (PROTONIX) 40 MG tablet [id number] TAKE 1 TABLET EVERY DAY TDelsa Grana PA-C Taking Active Self  potassium chloride SA (KLOR-CON) 20 MEQ tablet [id number] Take 1 tablet (20 mEq total) by mouth daily. TDelsa Grana PA-C Taking Active Self  rosuvastatin (CRESTOR) 20 MG tablet [id number] TAKE 1 TABLET (20 MG TOTAL) BY MOUTH AT BEDTIME. TDelsa Grana PA-C Taking Active Self  tiZANidine (ZANAFLEX) 4 MG tablet [id number] TAKE 1 TABLET (4 MG TOTAL) BY MOUTH EVERY 8 (EIGHT) HOURS AS NEEDED FOR MUSCLE SPASMS. TDelsa Grana PA-C Taking Active Self  valsartan (DIOVAN) 80 MG tablet [id number] Take 1 tablet (80 mg total) by mouth daily. 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 12/07/2017   Anemia of chronic disease 09/05/2017   Colon polyps 09/05/2017   Diabetes mellitus type 2 with complications, uncontrolled ([place]) 07/14/2016   Hypertension 07/14/2016   Hyperlipemia 07/14/2016   Bilateral lower extremity edema 07/14/2016   GERD (gastroesophageal reflux disease) 07/14/2016   Screening for colorectal cancer 07/14/2016    Immunization History  Administered Date(s) Administered   Fluad Quad(high Dose 65+) 09/20/2018, 10/16/2019   Influenza, High Dose Seasonal PF 10/16/2019   Influenza,inj,Quad PF,6+ Mos 10/18/2016, 09/29/2017   Pneumococcal Conjugate-13 10/16/2019   Pneumococcal Polysaccharide-23 03/26/2013    Conditions to be addressed/monitored:  Hypertension, Hyperlipidemia, Diabetes, GERD and COPD  There are no care plans that you recently modified to display for this patient.    Medication Assistance:  Will determine if patient assistance necessary for inhaler treatments  Patient's preferred pharmacy is:  [facility] - [address], [place] [address] Phone: [phone] Fax: [phone]  [facility] [address], [place] [address] Phone: [phone] Fax: [phone]  Uses pill box? Yes Pt endorses 100% compliance  We discussed: Current pharmacy is preferred with insurance plan and patient is satisfied with pharmacy services Patient decided to: Continue current medication management strategy  Care Plan and Follow Up Patient Decision:  Patient agrees to Care Plan and Follow-up.  Plan: Telephone follow up appointment  with care management team member scheduled for:  07/09/2020 at 11:00 AM  Doristine Section, [facility] [phone]  Current Barriers:  Unable to achieve control of Breathing  Unable to maintain control of blood pressure  Pharmacist Clinical Goal(s):  Patient will achieve control of COPD as evidenced by stable breathing per patient report maintain control of blood pressure as evidenced by BP less than 140/90  through collaboration with PharmD and provider.   Interventions: 1:1 collaboration with [name], PA-C regarding development and update of comprehensive plan of care as evidenced by provider attestation and co-signature Inter-disciplinary care team collaboration (see longitudinal plan of care) Comprehensive medication review performed; medication list updated in electronic medical record  Hypertension (BP goal <140/90) -Not ideally controlled -Current treatment: Furosmide 20 mg daily as needed Hydrochlorothiazide 25 mg daily  Lisinopril 10 mg daily  -Medications previously tried: NA  -Patient with chronic, wet cough for 2+ years. Likely more related to pulmonary issues, but could consider switching to ARB to see if cough improves  -Current home readings: Has not been routinely monitoring -Denies hypotensive/hypertensive symptoms -Educated on Daily salt intake goal < 2300 mg; Importance of home blood pressure monitoring; -Counseled to monitor BP at home three times weekly, document, and provide log at future appointments -Recommended stopping lisinopril due to possible influence on chronic cough  -Recommended Olmesartan 20 mg daily (higher equivalent dose but patient has had elevated readings at past doctor's visits)  -Recommend BMP at next PCP follow-up.   Hyperlipidemia: (LDL goal < 100) -Controlled -Current treatment: Rosuvastatin 20 mg daily  -Medications previously tried: NA  -Educated on Importance of limiting foods high  in cholesterol; -Recommended to continue current medication  Diabetes (A1c goal <8%) -Controlled -Current medications: Metformin 1000 mg twice daily  NPH  32 units twice daily  -Medications previously tried: NA  -Current home glucose readings fasting glucose: 95,99, 101 (prior to surgery), 177, 170, 149  post prandial glucose: NA -Denies hypoglycemic/hyperglycemic symptoms -Educated on Benefits of routine self-monitoring of blood sugar; -Counseled to check feet daily and get yearly eye exams -Recommended to continue current medication  COPD (Goal: control symptoms and prevent exacerbations) -Uncontrolled -Current treatment  None -Medications previously tried: Trelegy (worsened cough)  -Gold Grade: NA -Current COPD Classification:  B (high sx, <2 exacerbations/yr) -MMRC score: 2-3 per patient report. Gets short of breath walking within house, or walking to mailbox and back. Resolves with rest. -Pulmonary function testing: NA -Exacerbations requiring treatment in last 6 months: None -Patient denies consistent use of maintenance inhaler -Frequency of rescue inhaler use: NA -Counseled on Benefits of consistent maintenance inhaler use  -Patient would likely benefit from long-acting inhaler with different delivery mechanism that does not cause as much irritation.  -Recommended starting Stiolto 2 inhalations once daily (with PAP) -Recommend PFTs.   Hypothyroidism (Goal: Maintain stable thyroid function) -Controlled -Current treatment  Levothyroxine 25 mcg daily  -Medications previously tried: NA  -Recommended to continue current medication  GERD (Goal: Prevent heartburn/reflux) -Controlled -Current treatment  Pantoprazole 40 mg daily  -Medications previously tried: NA -Patient with chronic cough of unclear etiology. Per patient reflux symptoms have been well controlled.    -Recommended to continue current medication  Patient Goals/Self-Care Activities Patient will:  - check  glucose twice daily; before breakfast and supper, document, and provide at future appointments check blood pressure three times weekly, document, and provide at future appointments  Follow Up Plan: Telephone follow up appointment with care management team member scheduled for:  07/09/2020 at 11:00 AM

## 2020-07-17 ENCOUNTER — Telehealth: Payer: Self-pay

## 2020-07-17 NOTE — Progress Notes (Signed)
    Chronic Care Management Pharmacy Assistant   Name: Tamara [name]  MRN: [id number] DOB: 04-Dec-1945  Reason for Encounter: Medication Review/Adherence Review   Recent office visits:  No recent Office Visit  Recent consult visits:  No recent [facility] visits:  None in previous 6 months  Medications: Outpatient Encounter Medications as of 07/17/2020  Medication Sig   Accu-Chek Softclix Lancets lancets TEST BLOOD SUGAR TWICE DAILY.   Alcohol Swabs (B-D SINGLE USE SWABS REGULAR) PADS TEST BLOOD SUGAR TWICE DAILY DX:E11.65   Ascorbic Acid (VITAMIN C) 1000 MG tablet Take 1,000 mg by mouth daily.   Cholecalciferol (VITAMIN D) 50 MCG (2000 UT) CAPS Take 2,000 Units by mouth daily.   DROPLET PEN NEEDLES 32G X 4 MM MISC 1 Device by Other route daily.   furosemide (LASIX) 20 MG tablet Take 1 tablet (20 mg total) by mouth daily as needed for fluid or edema.   hydrochlorothiazide (HYDRODIURIL) 25 MG tablet TAKE 1 TABLET (25 MG TOTAL) BY MOUTH DAILY.   Insulin NPH, Human,, Isophane, (NOVOLIN N FLEXPEN) 100 UNIT/ML Kiwkpen Inject 32 Units into the skin 2 (two) times daily. And pen needles (2mm x 32G) 1/day   levofloxacin (LEVAQUIN) 500 MG tablet Take 1 tablet (500 mg total) by mouth daily.   levothyroxine (SYNTHROID) 25 MCG tablet TAKE 1 TABLET (25 MCG TOTAL) BY MOUTH DAILY BEFORE BREAKFAST. (DOSE CHANGE)   meloxicam (MOBIC) 7.5 MG tablet Take 1 tablet (7.5 mg total) by mouth daily as needed for pain.   metFORMIN (GLUCOPHAGE) 1000 MG tablet TAKE 1 TABLET TWICE DAILY WITH MEALS   methocarbamol (ROBAXIN) 500 MG tablet TAKE 1 TABLET EVERY 8 HOURS AS NEEDED FOR MUSCLE SPASMS.   nystatin (MYCOSTATIN/NYSTOP) powder Apply 1 application topically daily as needed (yeast under belly).   Omega-3 Fatty Acids (FISH OIL) 1000 MG CAPS Take 1,000 mg by mouth daily.   pantoprazole (PROTONIX) 40 MG tablet TAKE 1 TABLET EVERY DAY   potassium chloride SA (KLOR-CON) 20 MEQ tablet TAKE 1 TABLET  EVERY DAY   rosuvastatin (CRESTOR) 20 MG tablet TAKE 1 TABLET (20 MG TOTAL) BY MOUTH AT BEDTIME.   tiZANidine (ZANAFLEX) 4 MG tablet TAKE 1 TABLET (4 MG TOTAL) BY MOUTH EVERY 8 (EIGHT) HOURS AS NEEDED FOR MUSCLE SPASMS.   valsartan (DIOVAN) 80 MG tablet Take 1 tablet (80 mg total) by mouth daily.   No facility-administered encounter medications on file as of 07/17/2020.    Star Rating Drugs: Valsartan 80 mg not on filled history Metformin 1000 last filled on 05/12/2020 for 90 day supply at [facility]. Rosuvastatin 20 mg last filled on 05/07/2020 for 90 day supply at [facility].  Care Gaps: Pert patient Chart patient is due for the following care gaps, Covid-19, Tetanus,Shingrix and Ophthalmology exam.  Annual Wellness visit is schedule  on 10/16/2020 at her PCP office.   [place] Pharmacist Assistant [phone]

## 2020-07-18 ENCOUNTER — Other Ambulatory Visit: Payer: Self-pay

## 2020-07-18 ENCOUNTER — Ambulatory Visit (INDEPENDENT_AMBULATORY_CARE_PROVIDER_SITE_OTHER): Payer: Medicare HMO | Admitting: Endocrinology

## 2020-07-18 VITALS — BP 130/80 | HR 92 | Ht 66.0 in | Wt 240.4 lb

## 2020-07-18 DIAGNOSIS — E118 Type 2 diabetes mellitus with unspecified complications: Secondary | ICD-10-CM | POA: Diagnosis not present

## 2020-07-18 DIAGNOSIS — E1165 Type 2 diabetes mellitus with hyperglycemia: Secondary | ICD-10-CM

## 2020-07-18 DIAGNOSIS — IMO0002 Reserved for concepts with insufficient information to code with codable children: Secondary | ICD-10-CM

## 2020-07-18 DIAGNOSIS — Z6838 Body mass index (BMI) 38.0-38.9, adult: Secondary | ICD-10-CM | POA: Diagnosis not present

## 2020-07-18 LAB — POCT GLYCOSYLATED HEMOGLOBIN (HGB A1C): Hemoglobin A1C: 7.4 % — AB (ref 4.0–5.6)

## 2020-07-18 MED ORDER — NOVOLIN N FLEXPEN 100 UNIT/ML ~~LOC~~ SUPN: PEN_INJECTOR | SUBCUTANEOUS | 3 refills | Status: DC

## 2020-07-18 NOTE — Progress Notes (Signed)
Subjective:    Patient ID: Tamara [name], female    DOB: 03/03/1945, 75 y.o.   MRN: [id number]  HPI Pt returns for f/u of diabetes mellitus: DM type: Insulin-requiring type 2 Dx'ed: 1779 Complications: none Therapy: insulin since 2021, and metformin.   GDM: never DKA: never Severe hypoglycemia: never Pancreatitis: never Pancreatic imaging: never SDOH: she cannot afford brand name meds.  Other: she also took insulin 2017-2018; she removed the need for insulin by losing 40 lbs; she declines weight loss surgery; she declines multiple daily injections.   Interval history: she brings her meter with her cbg's which I have reviewed today.  cbg's vary from 71-179.  It is in general higher as the day goes on.  She takes insulin as rx'ed.  Past Medical History:  Diagnosis Date   Arthritis    knee and shoulders   Diabetes mellitus without complication ([place])    type II   Dyspnea    05/16/20 has had a cough for 2.5 years post Covid   Gastritis 09/05/2017   GERD (gastroesophageal reflux disease)    Hyperlipidemia    Hypertension    patient denies   Hypothyroidism    Panic attack    RUQ pain 03/09/2017   Per patient, she has had RUQ pain 4-5 years that has grown worse in the last few months.    Past Surgical History:  Procedure Laterality Date   BREAST BIOPSY Left 2008   CORE W/CLIP - NEG   BRONCHIAL BIOPSY  05/19/2020   Procedure: BRONCHIAL BIOPSIES;  Surgeon: Collene Gobble, MD;  Location: [facility] ENDOSCOPY;  Service: Pulmonary;;   BRONCHIAL BRUSHINGS  05/19/2020   Procedure: BRONCHIAL BRUSHINGS;  Surgeon: Collene Gobble, MD;  Location: [facility] ENDOSCOPY;  Service: Pulmonary;;   BRONCHIAL NEEDLE ASPIRATION BIOPSY  05/19/2020   Procedure: BRONCHIAL NEEDLE ASPIRATION BIOPSIES;  Surgeon: Collene Gobble, MD;  Location: [place];  Service: Pulmonary;;   BRONCHIAL WASHINGS  05/19/2020   Procedure: BRONCHIAL WASHINGS;  Surgeon: Collene Gobble, MD;  Location: [place];  Service: Pulmonary;;    COLONOSCOPY WITH PROPOFOL N/A 02/08/2017   Procedure: COLONOSCOPY WITH PROPOFOL;  Surgeon: Lollie Sails, MD;  Location: [facility] ENDOSCOPY;  Service: Endoscopy;  Laterality: N/A;   ESOPHAGOGASTRODUODENOSCOPY (EGD) WITH PROPOFOL N/A 02/08/2017   Procedure: ESOPHAGOGASTRODUODENOSCOPY (EGD) WITH PROPOFOL;  Surgeon: Lollie Sails, MD;  Location[facility] ENDOSCOPY;  Service: Endoscopy;  Laterality: N/A;   TUBAL LIGATION     VIDEO BRONCHOSCOPY WITH ENDOBRONCHIAL NAVIGATION N/A 05/19/2020   Procedure: VIDEO BRONCHOSCOPY WITH ENDOBRONCHIAL NAVIGATION;  Surgeon: Collene Gobble, MD;  Location: [place] ENDOSCOPY;  Service: Pulmonary;  Laterality: N/A;    Social History   Socioeconomic History   Marital status: Married    Spouse name: Not on file   Number of children: 3   Years of education: Not on file   Highest education level: High school graduate  Occupational History   Occupation: retired  Tobacco Use   Smoking status: Former    Packs/day: 1.00    Years: 40.00    Pack years: 40.00    Types: Cigarettes    Quit date: 2006    Years since quitting: 16.5   Smokeless tobacco: Never  Vaping Use   Vaping Use: Never used  Substance and Sexual Activity   Alcohol use: No   Drug use: No   Sexual activity: Not on file  Other Topics Concern   Not on file  Social History Narrative   Not  on file   Social Determinants of Health   Financial Resource Strain: Medium Risk   Difficulty of Paying Living Expenses: Somewhat hard  Food Insecurity: No Food Insecurity   Worried About Charity fundraiser in the Last Year: Never true   Ran Out of Food in the Last Year: Never true  Transportation Needs: No Transportation Needs   Lack of Transportation (Medical): No   Lack of Transportation (Non-Medical): No  Physical Activity: Inactive   Days of Exercise per Week: 0 days   Minutes of Exercise per Session: 0 min  Stress: No Stress Concern Present   Feeling of Stress : Not at all  Social Connections:  Moderately Isolated   Frequency of Communication with Friends and Family: More than three times a week   Frequency of Social Gatherings with Friends and Family: More than three times a week   Attends Religious Services: Never   Marine scientist or Organizations: No   Attends Music therapist: Never   Marital Status: Married  Human resources officer Violence: Not At Risk   Fear of Current or Ex-Partner: No   Emotionally Abused: No   Physically Abused: No   Sexually Abused: No    Current Outpatient Medications on File Prior to Visit  Medication Sig Dispense Refill   Accu-Chek Softclix Lancets lancets TEST BLOOD SUGAR TWICE DAILY. 200 each 11   Alcohol Swabs (B-D SINGLE USE SWABS REGULAR) PADS TEST BLOOD SUGAR TWICE DAILY DX:E11.65 100 each 5   Ascorbic Acid (VITAMIN C) 1000 MG tablet Take 1,000 mg by mouth daily.     Cholecalciferol (VITAMIN D) 50 MCG (2000 UT) CAPS Take 2,000 Units by mouth daily.     DROPLET PEN NEEDLES 32G X 4 MM MISC 1 Device by Other route daily. 100 each 3   furosemide (LASIX) 20 MG tablet Take 1 tablet (20 mg total) by mouth daily as needed for fluid or edema. 30 tablet 1   hydrochlorothiazide (HYDRODIURIL) 25 MG tablet TAKE 1 TABLET (25 MG TOTAL) BY MOUTH DAILY. 90 tablet 3   levofloxacin (LEVAQUIN) 500 MG tablet Take 1 tablet (500 mg total) by mouth daily. 14 tablet 0   levothyroxine (SYNTHROID) 25 MCG tablet TAKE 1 TABLET (25 MCG TOTAL) BY MOUTH DAILY BEFORE BREAKFAST. (DOSE CHANGE) 90 tablet 3   meloxicam (MOBIC) 7.5 MG tablet Take 1 tablet (7.5 mg total) by mouth daily as needed for pain.     metFORMIN (GLUCOPHAGE) 1000 MG tablet TAKE 1 TABLET TWICE DAILY WITH MEALS 180 tablet 1   methocarbamol (ROBAXIN) 500 MG tablet TAKE 1 TABLET EVERY 8 HOURS AS NEEDED FOR MUSCLE SPASMS. 30 tablet 1   nystatin (MYCOSTATIN/NYSTOP) powder Apply 1 application topically daily as needed (yeast under belly).     Omega-3 Fatty Acids (FISH OIL) 1000 MG CAPS Take 1,000 mg  by mouth daily.     pantoprazole (PROTONIX) 40 MG tablet TAKE 1 TABLET EVERY DAY 90 tablet 3   potassium chloride SA (KLOR-CON) 20 MEQ tablet TAKE 1 TABLET EVERY DAY 90 tablet 1   rosuvastatin (CRESTOR) 20 MG tablet TAKE 1 TABLET (20 MG TOTAL) BY MOUTH AT BEDTIME. 90 tablet 3   tiZANidine (ZANAFLEX) 4 MG tablet TAKE 1 TABLET (4 MG TOTAL) BY MOUTH EVERY 8 (EIGHT) HOURS AS NEEDED FOR MUSCLE SPASMS. 30 tablet 0   valsartan (DIOVAN) 80 MG tablet Take 1 tablet (80 mg total) by mouth daily. 90 tablet 1   No current facility-administered medications on file prior  to visit.    Allergies  Allergen Reactions   Penicillins Hives    Reaction: 1965    Family History  Problem Relation Age of Onset   Diabetes Maternal Grandmother    Breast cancer Neg Hx     BP 130/80 (BP Location: Right Arm, Patient Position: Sitting, Cuff Size: Large)   Pulse 92   Ht 5\' 6"  (1.676 m)   Wt 240 lb 6.4 oz (109 kg)   SpO2 95%   BMI 38.80 kg/m    Review of Systems She denies hypoglycemia    Objective:   Physical Exam Pulses: dorsalis pedis intact bilat.   MSK: no deformity of the feet CV: trace bilat leg edema Skin:  no ulcer on the feet.  normal color and temp on the feet. Neuro: sensation is intact to touch on the feet   Lab Results  Component Value Date   HGBA1C 7.4 (A) 07/18/2020      Assessment & Plan:  Insulin-requiring type 2 DM: Based on the pattern of her cbg's, she needs some adjustment in her therapy.   Patient Instructions  check your blood sugar once a day.  vary the time of day when you check, between before the 3 meals, and at bedtime.  also check if you have symptoms of your blood sugar being too high or too low.  please keep a record of the readings and bring it to your next appointment here (or you can bring the meter itself).  You can write it on any piece of paper.  please call us sooner if your blood sugar goes below 70, or if you have a lot of readings over 200.   Please change  the insulin to 40 units each morning, and 24 units each evening, and continue the same metformin.   On this type of insulin schedule, you should eat meals on a regular schedule.  If a meal is missed or significantly delayed, your blood sugar could go low.   Please come back for a follow-up appointment in 4 months

## 2020-07-18 NOTE — Patient Instructions (Addendum)
check your blood sugar once a day.  vary the time of day when you check, between before the 3 meals, and at bedtime.  also check if you have symptoms of your blood sugar being too high or too low.  please keep a record of the readings and bring it to your next appointment here (or you can bring the meter itself).  You can write it on any piece of paper.  please call us sooner if your blood sugar goes below 70, or if you have a lot of readings over 200.   Please change the insulin to 40 units each morning, and 24 units each evening, and continue the same metformin.   On this type of insulin schedule, you should eat meals on a regular schedule.  If a meal is missed or significantly delayed, your blood sugar could go low.   Please come back for a follow-up appointment in 4 months

## 2020-07-22 ENCOUNTER — Telehealth: Payer: Self-pay

## 2020-07-22 NOTE — Progress Notes (Signed)
Chronic Care Management Pharmacy Assistant   Name: Tamara [name]  MRN: [id number] DOB: 1945/01/27   Reason for Encounter: Medication Review/General Adherence Call.   Recent office visits:  No recent Office Visit  Recent consult visits:  07/18/2020 Dr. Loanne Drilling MD ( Endocrinology)  change the insulin to 40 units each morning, and 24 units each evening, and continue the same metformin.    Hospital visits:  None in previous 6 months  Medications: Outpatient Encounter Medications as of 07/22/2020  Medication Sig   Accu-Chek Softclix Lancets lancets TEST BLOOD SUGAR TWICE DAILY.   Alcohol Swabs (B-D SINGLE USE SWABS REGULAR) PADS TEST BLOOD SUGAR TWICE DAILY DX:E11.65   Ascorbic Acid (VITAMIN C) 1000 MG tablet Take 1,000 mg by mouth daily.   Cholecalciferol (VITAMIN D) 50 MCG (2000 UT) CAPS Take 2,000 Units by mouth daily.   DROPLET PEN NEEDLES 32G X 4 MM MISC 1 Device by Other route daily.   furosemide (LASIX) 20 MG tablet Take 1 tablet (20 mg total) by mouth daily as needed for fluid or edema.   hydrochlorothiazide (HYDRODIURIL) 25 MG tablet TAKE 1 TABLET (25 MG TOTAL) BY MOUTH DAILY.   Insulin NPH, Human,, Isophane, (NOVOLIN N FLEXPEN) 100 UNIT/ML Kiwkpen 40 units each morning, and 24 units each evening, and pen needles (41mm x 32G) 1/day.   levofloxacin (LEVAQUIN) 500 MG tablet Take 1 tablet (500 mg total) by mouth daily.   levothyroxine (SYNTHROID) 25 MCG tablet TAKE 1 TABLET (25 MCG TOTAL) BY MOUTH DAILY BEFORE BREAKFAST. (DOSE CHANGE)   meloxicam (MOBIC) 7.5 MG tablet Take 1 tablet (7.5 mg total) by mouth daily as needed for pain.   metFORMIN (GLUCOPHAGE) 1000 MG tablet TAKE 1 TABLET TWICE DAILY WITH MEALS   methocarbamol (ROBAXIN) 500 MG tablet TAKE 1 TABLET EVERY 8 HOURS AS NEEDED FOR MUSCLE SPASMS.   nystatin (MYCOSTATIN/NYSTOP) powder Apply 1 application topically daily as needed (yeast under belly).   Omega-3 Fatty Acids (FISH OIL) 1000 MG CAPS Take 1,000 mg by mouth  daily.   pantoprazole (PROTONIX) 40 MG tablet TAKE 1 TABLET EVERY DAY   potassium chloride SA (KLOR-CON) 20 MEQ tablet TAKE 1 TABLET EVERY DAY   rosuvastatin (CRESTOR) 20 MG tablet TAKE 1 TABLET (20 MG TOTAL) BY MOUTH AT BEDTIME.   tiZANidine (ZANAFLEX) 4 MG tablet TAKE 1 TABLET (4 MG TOTAL) BY MOUTH EVERY 8 (EIGHT) HOURS AS NEEDED FOR MUSCLE SPASMS.   valsartan (DIOVAN) 80 MG tablet Take 1 tablet (80 mg total) by mouth daily.   No facility-administered encounter medications on file as of 07/22/2020.    Care Gaps: Per patient chart , Patient is due for the following care gaps:Covid 19 vaccine, Tetanus Vaccine , Shingrix Vaccine and Ophthalmology exam. Star Rating Drugs: Valsartan 80 mg  last filled on 06/04/2020 for 90 day supply Metformin 1000 last filled on 05/12/2020 for 90 day supply at [facility]. Rosuvastatin 20 mg last filled on 05/07/2020 for 90 day supply at [facility].   Called patient and discussed medication adherence  with patient, no issues at this time with current medication.   Patient denies ED visit since her last CPP follow up.  Patient denies any side effects with her medication. Patient denies any problems with her current pharmacy  Patient states she is doing good with the insulin dose increase.  Patient Schedule a telephone follow up with the clinical pharmacist on 09/17/2020 at 1:00 pm. Sent message to scheduler.  [place] Pharmacist Assistant [phone]

## 2020-07-28 ENCOUNTER — Other Ambulatory Visit: Payer: Self-pay

## 2020-07-28 ENCOUNTER — Ambulatory Visit (INDEPENDENT_AMBULATORY_CARE_PROVIDER_SITE_OTHER): Payer: Medicare HMO | Admitting: Family Medicine

## 2020-07-28 ENCOUNTER — Encounter: Payer: Self-pay | Admitting: Family Medicine

## 2020-07-28 VITALS — BP 136/74 | HR 91 | Temp 97.8°F | Resp 18 | Ht 66.0 in | Wt 242.1 lb

## 2020-07-28 DIAGNOSIS — M545 Low back pain, unspecified: Secondary | ICD-10-CM

## 2020-07-28 DIAGNOSIS — R053 Chronic cough: Secondary | ICD-10-CM

## 2020-07-28 DIAGNOSIS — K219 Gastro-esophageal reflux disease without esophagitis: Secondary | ICD-10-CM

## 2020-07-28 DIAGNOSIS — D649 Anemia, unspecified: Secondary | ICD-10-CM | POA: Diagnosis not present

## 2020-07-28 DIAGNOSIS — E079 Disorder of thyroid, unspecified: Secondary | ICD-10-CM | POA: Diagnosis not present

## 2020-07-28 DIAGNOSIS — E1165 Type 2 diabetes mellitus with hyperglycemia: Secondary | ICD-10-CM

## 2020-07-28 DIAGNOSIS — R609 Edema, unspecified: Secondary | ICD-10-CM

## 2020-07-28 DIAGNOSIS — R0600 Dyspnea, unspecified: Secondary | ICD-10-CM

## 2020-07-28 DIAGNOSIS — E118 Type 2 diabetes mellitus with unspecified complications: Secondary | ICD-10-CM

## 2020-07-28 DIAGNOSIS — E785 Hyperlipidemia, unspecified: Secondary | ICD-10-CM | POA: Diagnosis not present

## 2020-07-28 DIAGNOSIS — I152 Hypertension secondary to endocrine disorders: Secondary | ICD-10-CM

## 2020-07-28 DIAGNOSIS — IMO0002 Reserved for concepts with insufficient information to code with codable children: Secondary | ICD-10-CM

## 2020-07-28 DIAGNOSIS — E1169 Type 2 diabetes mellitus with other specified complication: Secondary | ICD-10-CM | POA: Diagnosis not present

## 2020-07-28 DIAGNOSIS — J449 Chronic obstructive pulmonary disease, unspecified: Secondary | ICD-10-CM | POA: Diagnosis not present

## 2020-07-28 DIAGNOSIS — E1159 Type 2 diabetes mellitus with other circulatory complications: Secondary | ICD-10-CM | POA: Diagnosis not present

## 2020-07-28 DIAGNOSIS — J984 Other disorders of lung: Secondary | ICD-10-CM

## 2020-07-28 DIAGNOSIS — Z6839 Body mass index (BMI) 39.0-39.9, adult: Secondary | ICD-10-CM

## 2020-07-28 MED ORDER — METHOCARBAMOL 500 MG PO TABS: ORAL_TABLET | ORAL | 3 refills | Status: DC

## 2020-07-28 NOTE — Progress Notes (Signed)
Name: Tamara [name]   MRN: [id number]    DOB: 09/30/1945   Date:07/28/2020       Progress Note  Chief Complaint  Patient presents with   Hyperlipidemia   Diabetes   Gastroesophageal Reflux   Hypothyroidism     Subjective:   Tamara [name] is a 75 y.o. female, presents to clinic for routine f/up  IDDM - sees endo Lab Results  Component Value Date   HGBA1C 7.4 (A) 07/18/2020  Also on metformin  Hyperlipidemia: Currently treated with crestor 20 mg, pt reports good med compliance Last Lipids: Lab Results  Component Value Date   CHOL 152 03/22/2019   HDL 42 (L) 03/22/2019   LDLCALC 82 03/22/2019   TRIG 189 (H) 03/22/2019   CHOLHDL 3.6 03/22/2019   - Denies: Chest pain, myalgias, claudication  Hyperlipidemia: Currently treated with valsartan and HCTZ , pt reports good med compliance Takes lasix PRN for LE edema Last Lipids: Lab Results  Component Value Date   CHOL 152 03/22/2019   HDL 42 (L) 03/22/2019   LDLCALC 82 03/22/2019   TRIG 189 (H) 03/22/2019   CHOLHDL 3.6 03/22/2019   - Denies: Chest pain, myalgias, claudication   Hypothyroidism: History: hypothyroidism and subclinical hypothyroidism Current Medication Regimen: 25 mcg synthroid Takes medicine  Current Symptoms: increased swelling all over, notes weight change  Most recent results are below; we will be repeating labs today. Lab Results  Component Value Date   TSH 2.24 11/15/2019    Pt recently complained of 2 years of cough and SOB which she noted started in 2020 after COVID, prior CXR and testing was negative - with pulm eval a lung mass was found and subsequent BAL revealed atypical cells, she was tx with Abx  Dx with cavitating mass of LLL, chronic cough and COPD w/ prior smoking hx  Extended course of levaquin and f/up CT chest planned - seeing [place] pulm - Dr. Lamonte Sakai She notes feeling chronically SOB with chronic cough  GERD - doing protonix daily, no other OTC meds, sx are  controlled with daily Rx, sx are recurrent and severe when she holds PPI          Current Outpatient Medications:    Accu-Chek Softclix Lancets lancets, TEST BLOOD SUGAR TWICE DAILY., Disp: 200 each, Rfl: 11   Alcohol Swabs (B-D SINGLE USE SWABS REGULAR) PADS, USE  TO TEST BLOOD SUGAR TWICE DAILY, Disp: 200 each, Rfl: 3   Ascorbic Acid (VITAMIN C) 1000 MG tablet, Take 1,000 mg by mouth daily., Disp: , Rfl:    Cholecalciferol (VITAMIN D) 50 MCG (2000 UT) CAPS, Take 2,000 Units by mouth daily., Disp: , Rfl:    DROPLET PEN NEEDLES 32G X 4 MM MISC, 1 Device by Other route daily., Disp: 100 each, Rfl: 3   furosemide (LASIX) 20 MG tablet, Take 1 tablet (20 mg total) by mouth daily as needed for fluid or edema., Disp: 30 tablet, Rfl: 1   hydrochlorothiazide (HYDRODIURIL) 25 MG tablet, TAKE 1 TABLET (25 MG TOTAL) BY MOUTH DAILY., Disp: 90 tablet, Rfl: 3   Insulin NPH, Human,, Isophane, (NOVOLIN N FLEXPEN) 100 UNIT/ML Kiwkpen, 40 units each morning, and 24 units each evening, and pen needles (43m x 32G) 1/day., Disp: 70 mL, Rfl: 3   levothyroxine (SYNTHROID) 25 MCG tablet, TAKE 1 TABLET (25 MCG TOTAL) BY MOUTH DAILY BEFORE BREAKFAST. (DOSE CHANGE), Disp: 90 tablet, Rfl: 3   meloxicam (MOBIC) 7.5 MG tablet, Take 1 tablet (7.5 mg total) by  mouth daily as needed for pain., Disp: , Rfl:    metFORMIN (GLUCOPHAGE) 1000 MG tablet, TAKE 1 TABLET TWICE DAILY WITH MEALS, Disp: 180 tablet, Rfl: 1   methocarbamol (ROBAXIN) 500 MG tablet, TAKE 1 TABLET EVERY 8 HOURS AS NEEDED FOR MUSCLE SPASMS., Disp: 30 tablet, Rfl: 1   nystatin (MYCOSTATIN/NYSTOP) powder, Apply 1 application topically daily as needed (yeast under belly)., Disp: , Rfl:    Omega-3 Fatty Acids (FISH OIL) 1000 MG CAPS, Take 1,000 mg by mouth daily., Disp: , Rfl:    pantoprazole (PROTONIX) 40 MG tablet, TAKE 1 TABLET EVERY DAY, Disp: 90 tablet, Rfl: 3   potassium chloride SA (KLOR-CON) 20 MEQ tablet, TAKE 1 TABLET EVERY DAY, Disp: 90 tablet, Rfl:  1   rosuvastatin (CRESTOR) 20 MG tablet, TAKE 1 TABLET (20 MG TOTAL) BY MOUTH AT BEDTIME., Disp: 90 tablet, Rfl: 3   tiZANidine (ZANAFLEX) 4 MG tablet, TAKE 1 TABLET (4 MG TOTAL) BY MOUTH EVERY 8 (EIGHT) HOURS AS NEEDED FOR MUSCLE SPASMS., Disp: 30 tablet, Rfl: 0   valsartan (DIOVAN) 80 MG tablet, Take 1 tablet (80 mg total) by mouth daily., Disp: 90 tablet, Rfl: 1  Patient Active Problem List   Diagnosis Date Noted   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 05/13/2020   Chronic cough 05/13/2020   COPD (chronic obstructive pulmonary disease) ([place]) 05/13/2020   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 12/07/2017   Anemia of chronic disease 09/05/2017   Colon polyps 09/05/2017   Diabetes mellitus type 2 with complications, uncontrolled ([place]) 07/14/2016   Hypertension 07/14/2016   Hyperlipemia 07/14/2016   Bilateral lower extremity edema 07/14/2016   GERD (gastroesophageal reflux disease) 07/14/2016   Screening for colorectal cancer 07/14/2016    Past Surgical History:  Procedure Laterality Date   BREAST BIOPSY Left 2008   CORE W/CLIP - NEG   BRONCHIAL BIOPSY  05/19/2020   Procedure: BRONCHIAL BIOPSIES;  Surgeon: Collene Gobble, MD;  Location: [facility] ENDOSCOPY;  Service: Pulmonary;;   BRONCHIAL BRUSHINGS  05/19/2020   Procedure: BRONCHIAL BRUSHINGS;  Surgeon: Collene Gobble, MD;  [facility] ENDOSCOPY;  Service: Pulmonary;;   BRONCHIAL NEEDLE ASPIRATION BIOPSY  05/19/2020   Procedure: BRONCHIAL NEEDLE ASPIRATION BIOPSIES;  Surgeon: Collene Gobble, MD;  Location: [facility] ENDOSCOPY;  Service: Pulmonary;;   BRONCHIAL WASHINGS  05/19/2020   Procedure: BRONCHIAL WASHINGS;  Surgeon: Collene Gobble, MD;  Location: [facility] ENDOSCOPY;  Service: Pulmonary;;   COLONOSCOPY WITH PROPOFOL N/A 02/08/2017   Procedure: COLONOSCOPY WITH PROPOFOL;  Surgeon: Lollie Sails, MD;  Location: [facility] ENDOSCOPY;  Service: Endoscopy;  Laterality: N/A;   ESOPHAGOGASTRODUODENOSCOPY (EGD)  WITH PROPOFOL N/A 02/08/2017   Procedure: ESOPHAGOGASTRODUODENOSCOPY (EGD) WITH PROPOFOL;  Surgeon: Lollie Sails, MD;  Location: [facility] ENDOSCOPY;  Service: Endoscopy;  Laterality: N/A;   TUBAL LIGATION     VIDEO BRONCHOSCOPY WITH ENDOBRONCHIAL NAVIGATION N/A 05/19/2020   Procedure: VIDEO BRONCHOSCOPY WITH ENDOBRONCHIAL NAVIGATION;  Surgeon: Collene Gobble, MD;  Location: [facility] ENDOSCOPY;  Service: Pulmonary;  Laterality: N/A;    Family History  Problem Relation Age of Onset   Diabetes Maternal Grandmother    Breast cancer Neg Hx     Social History   Tobacco Use   Smoking status: Former    Packs/day: 1.00    Years: 40.00    Pack years: 40.00    Types: Cigarettes    Quit date: 2006    Years since quitting: 55.5  Smokeless tobacco: Never  Vaping Use   Vaping Use: Never used  Substance Use Topics   Alcohol use: No   Drug use: No     Allergies  Allergen Reactions   Penicillins Hives    Reaction: 1965    Health Maintenance  Topic Date Due   MAMMOGRAM  03/08/2019   OPHTHALMOLOGY EXAM  07/28/2020 (Originally 01/24/2019)   COVID-19 Vaccine (1) 08/13/2020 (Originally 10/31/1950)   Zoster Vaccines- Shingrix (1 of 2) 10/28/2020 (Originally 10/30/1996)   INFLUENZA VACCINE  08/18/2020   HEMOGLOBIN A1C  01/18/2021   FOOT EXAM  07/18/2021   COLONOSCOPY (Pts 45-50yr Insurance coverage will need to be confirmed)  02/09/2027   DEXA SCAN  Completed   Hepatitis C Screening  Completed   PNA vac Low Risk Adult  Completed   HPV VACCINES  Aged Out   TETANUS/TDAP  Discontinued    Chart Review Today: I personally reviewed active problem list, medication list, allergies, family history, social history, health maintenance, notes from last encounter, lab results, imaging with the patient/caregiver today.   Review of Systems  Constitutional:  Positive for fatigue. Negative for activity change, appetite change, chills, diaphoresis and fever.  HENT: Negative.    Eyes: Negative.    Respiratory:  Positive for cough, shortness of breath and wheezing. Negative for choking and chest tightness.   Cardiovascular:  Positive for leg swelling. Negative for chest pain and palpitations.  Gastrointestinal: Negative.   Endocrine: Negative.   Genitourinary: Negative.   Musculoskeletal: Negative.   Skin: Negative.   Allergic/Immunologic: Negative.   Neurological: Negative.   Hematological: Negative.   Psychiatric/Behavioral: Negative.    All other systems reviewed and are negative.   Objective:   Vitals:   07/28/20 0946 07/28/20 1031  BP: 136/74   Pulse: (!) 103 91  Resp: 18   Temp: 97.8 F (36.6 C)   SpO2: 98%   Weight: 242 lb 1.6 oz (109.8 kg)   Height: _0  (1.676 m)     Body mass index is 39.08 kg/m.  Physical Exam Vitals and nursing note reviewed.  Constitutional:      General: She is not in acute distress.    Appearance: Normal appearance. She is well-developed. She is obese. She is not ill-appearing, toxic-appearing or diaphoretic.     Interventions: Face mask in place.  HENT:     Head: Normocephalic and atraumatic.     Right Ear: External ear normal.     Left Ear: External ear normal.  Eyes:     General: Lids are normal. No scleral icterus.       Right eye: No discharge.        Left eye: No discharge.     Conjunctiva/sclera: Conjunctivae normal.  Neck:     Trachea: Phonation normal. No tracheal deviation.  Cardiovascular:     Rate and Rhythm: Regular rhythm. Tachycardia present.     Pulses: Normal pulses.          Radial pulses are 2+ on the right side and 2+ on the left side.       Posterior tibial pulses are 2+ on the right side and 2+ on the left side.     Heart sounds: Normal heart sounds. No murmur heard.   No friction rub. No gallop.     Comments: 1+ pedal and ankle edema, no pretibial edema Pulmonary:     Effort: Pulmonary effort is normal. No respiratory distress.     Breath sounds: No stridor. Rales (mid  to lower left lobe coarse  rales) present. No wheezing or rhonchi.     Comments: Occasional cough Chest:     Chest wall: No tenderness.  Abdominal:     General: Bowel sounds are normal. There is no distension.     Palpations: Abdomen is soft.  Musculoskeletal:     Right lower leg: No edema.     Left lower leg: No edema.  Skin:    General: Skin is warm and dry.     Coloration: Skin is not jaundiced or pale.     Findings: No rash.  Neurological:     Mental Status: She is alert.     Motor: No abnormal muscle tone.     Gait: Gait normal.  Psychiatric:        Mood and Affect: Mood normal.        Speech: Speech normal.        Behavior: Behavior normal.        Assessment & Plan:     ICD-10-CM   1. Diabetes mellitus type 2 with complications, uncontrolled (HCC)  W38.9 COMPLETE METABOLIC PANEL WITH GFR   E11.65    per endocrinology last A1C done 10 d ago, mildly elevated    2. Hypertension associated with type 2 diabetes mellitus (HCC)  H73.42 COMPLETE METABOLIC PANEL WITH GFR   I15.2    stable, well controlled on valsartan and HCTZ    3. Hyperlipidemia associated with type 2 diabetes mellitus (HCC)  A76.81 COMPLETE METABOLIC PANEL WITH GFR   E78.5 Lipid panel   good statin compliance, over due for lipid panel     4. Gastroesophageal reflux disease without esophagitis  K21.9    long term ppi use - reviewed long term use SE/risks vs benefits - she has seen GI and done multiple EGD's, needs long term PPI    5. Thyroid disease  E07.9 TSH   on low dose - 25 mcg, she has fatigue, peripheral edema w/o change with low salt and lasix, consider dose increase    6. Low back pain without sciatica, unspecified back pain laterality, unspecified chronicity  M54.50 methocarbamol (ROBAXIN) 500 MG tablet   reported as chronic, no hx or imaging available, worse daily with activity, no sciatica or radicular sx, conservative tx, f/up may need PT/MRI etc    7. Anemia, unspecified type  D64.9 CBC with Differential/Platelet    likely due to chronic disease, monitoring    8. Chronic obstructive pulmonary disease, unspecified COPD type ([place])  J44.9    seeing pulm - PFT and daily inhaler may help with sx, encouraged pt to ask at her f/up in the next 1-2 weeks    9. Class 2 severe obesity with serious comorbidity and body mass index (BMI) of 39.0 to 39.9 in adult, unspecified obesity type ([place])  E66.01    Z68.79    with associated T2DM IDDM, HLD, HTN, chonic pain, COPD    10. 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 Chronic cough  R05.3    same as above, per pulm    12. Peripheral edema  R60.9    continue low salt diet, lasix not making a difference, see if thyroid meds can be adjusted, may need to try demedex instead of lasix ?    13. Dyspnea, unspecified type  R06.00    if no improvement with pulmonary management,  cardiology consult/eval may be appropriate with peripheral edema, chronic sx     If able to increase thyroid med dose will need repeat TSH in 6-8 weeks - f/up appt would be needed if needing to discuss any sx  Return in about 6 months (around 01/28/2021) for Routine follow-up HTN, thyroid, HLD, GERD .   [name], PA-C 07/28/20 9:54 AM

## 2020-07-29 ENCOUNTER — Other Ambulatory Visit: Payer: Self-pay | Admitting: Family Medicine

## 2020-07-29 DIAGNOSIS — E079 Disorder of thyroid, unspecified: Secondary | ICD-10-CM

## 2020-07-29 LAB — TSH: TSH: 3.88 mIU/L (ref 0.40–4.50)

## 2020-07-29 LAB — LIPID PANEL
Cholesterol: 181 mg/dL (ref <200)
HDL: 54 mg/dL (ref >=50)
LDL Cholesterol (Calc): 100 mg/dL (calc) — ABNORMAL HIGH
Non-HDL Cholesterol (Calc): 127 mg/dL (calc) (ref <130)
Total CHOL/HDL Ratio: 3.4 (calc) (ref <5.0)
Triglycerides: 176 mg/dL — ABNORMAL HIGH (ref <150)

## 2020-07-29 LAB — CBC WITH DIFFERENTIAL/PLATELET
Absolute Monocytes: 429 cells/uL (ref 200–950)
Basophils Absolute: 38 cells/uL (ref 0–200)
Basophils Relative: 0.6 %
Eosinophils Absolute: 499 cells/uL (ref 15–500)
Eosinophils Relative: 7.8 %
HCT: 34.5 % — ABNORMAL LOW (ref 35.0–45.0)
Hemoglobin: 10.7 g/dL — ABNORMAL LOW (ref 11.7–15.5)
Lymphs Abs: 1242 cells/uL (ref 850–3900)
MCH: 26.6 pg — ABNORMAL LOW (ref 27.0–33.0)
MCHC: 31 g/dL — ABNORMAL LOW (ref 32.0–36.0)
MCV: 85.6 fL (ref 80.0–100.0)
MPV: 10.4 fL (ref 7.5–12.5)
Monocytes Relative: 6.7 %
Neutro Abs: 4192 cells/uL (ref 1500–7800)
Neutrophils Relative %: 65.5 %
Platelets: 154 10*3/uL (ref 140–400)
RBC: 4.03 10*6/uL (ref 3.80–5.10)
RDW: 13.6 % (ref 11.0–15.0)
Total Lymphocyte: 19.4 %
WBC: 6.4 10*3/uL (ref 3.8–10.8)

## 2020-07-29 LAB — COMPLETE METABOLIC PANEL WITH GFR
AG Ratio: 1.8 (calc) (ref 1.0–2.5)
ALT: 20 U/L (ref 6–29)
AST: 27 U/L (ref 10–35)
Albumin: 4.4 g/dL (ref 3.6–5.1)
Alkaline phosphatase (APISO): 56 U/L (ref 37–153)
BUN: 15 mg/dL (ref 7–25)
CO2: 28 mmol/L (ref 20–32)
Calcium: 11 mg/dL — ABNORMAL HIGH (ref 8.6–10.4)
Chloride: 101 mmol/L (ref 98–110)
Creat: 0.97 mg/dL (ref 0.60–1.00)
Globulin: 2.5 g/dL (calc) (ref 1.9–3.7)
Glucose, Bld: 197 mg/dL — ABNORMAL HIGH (ref 65–99)
Potassium: 4.8 mmol/L (ref 3.5–5.3)
Sodium: 138 mmol/L (ref 135–146)
Total Bilirubin: 0.5 mg/dL (ref 0.2–1.2)
Total Protein: 6.9 g/dL (ref 6.1–8.1)
eGFR: 62 mL/min/{1.73_m2} (ref >=60)

## 2020-07-29 MED ORDER — LEVOTHYROXINE SODIUM 50 MCG PO TABS
50.0000 ug | ORAL_TABLET | Freq: Every day | ORAL | 1 refills | Status: DC
Start: 2020-07-29 — End: 2020-12-10

## 2020-08-07 ENCOUNTER — Other Ambulatory Visit: Payer: Self-pay

## 2020-08-07 DIAGNOSIS — E559 Vitamin D deficiency, unspecified: Secondary | ICD-10-CM

## 2020-08-11 ENCOUNTER — Other Ambulatory Visit: Payer: Self-pay | Admitting: Family Medicine

## 2020-08-18 ENCOUNTER — Ambulatory Visit: Payer: Medicare HMO | Admitting: Emergency Medicine

## 2020-09-17 ENCOUNTER — Ambulatory Visit: Payer: Medicare HMO | Admitting: Emergency Medicine

## 2020-09-17 ENCOUNTER — Encounter: Payer: Self-pay | Admitting: Emergency Medicine

## 2020-09-17 ENCOUNTER — Other Ambulatory Visit: Payer: Self-pay

## 2020-09-17 ENCOUNTER — Telehealth: Payer: Medicare HMO

## 2020-09-17 ENCOUNTER — Telehealth: Payer: Self-pay

## 2020-09-17 DIAGNOSIS — R053 Chronic cough: Secondary | ICD-10-CM

## 2020-09-17 DIAGNOSIS — J449 Chronic obstructive pulmonary disease, unspecified: Secondary | ICD-10-CM | POA: Diagnosis not present

## 2020-09-17 DIAGNOSIS — J984 Other disorders of lung: Secondary | ICD-10-CM

## 2020-09-17 MED ORDER — ALBUTEROL SULFATE HFA 108 (90 BASE) MCG/ACT IN AERS: 2.0000 | INHALATION_SPRAY | Freq: Four times a day (QID) | RESPIRATORY_TRACT | 6 refills | Status: DC | PRN

## 2020-09-17 MED ORDER — STIOLTO RESPIMAT 2.5-2.5 MCG/ACT IN AERS: 2.0000 | INHALATION_SPRAY | Freq: Every day | RESPIRATORY_TRACT | 0 refills | Status: DC

## 2020-09-17 MED ORDER — PANTOPRAZOLE SODIUM 40 MG PO TBEC: 40.0000 mg | DELAYED_RELEASE_TABLET | Freq: Two times a day (BID) | ORAL | 3 refills | Status: DC

## 2020-09-17 NOTE — Addendum Note (Signed)
Addended by: Gavin Potters R on: 09/17/2020 12:37 PM   Modules accepted: Orders

## 2020-09-17 NOTE — Progress Notes (Signed)
   Subjective:    Patient ID: Tamara [name], female    DOB: Jun 13, 1945, 75 y.o.   MRN: [id number]  HPI  ROV 09/17/20 --75 year old former smoker who follows up today for her chronic cough, probable COPD and abnormal CT scan of the chest.  We evaluated a medial left lower lobe opacity by bronchoscopy in May, no definitive diagnosis of malignancy.  I treated her with antibiotics and a subsequent PET scan 06/25/2020 showed no significant change in the masslike opacity and only mild hypermetabolism, question resolving pneumonia.  I treated her with another round of Levaquin in June because she had persistent sputum and cough.  Also changed her ACE inhibitor to valsartan.  She has been on Trelegy but stopped it months ago, PFT have not been done yet. Today she reports that her cough is unchanged > she has cough in the am when she wakes up, clear to yellow mucous. SOB with chores and housework.  On protonix qd, remains on fish oil. ACE-I to valsartan   Review of Systems As per HPI     Objective:   Physical Exam Vitals:   09/17/20 1134  BP: 126/74  Pulse: 82  Temp: (!) 97.3 F (36.3 C)  TempSrc: Oral  SpO2: 97%  Weight: 238 lb 6.4 oz (108.1 kg)  Height: '5\' 6"'$  (1.676 m)   Gen: Pleasant, well-nourished, in no distress,  normal affect  ENT: No lesions,  mouth clear,  oropharynx clear, no postnasal drip  Neck: No JVD, no stridor  Lungs: No use of accessory muscles, no crackles or wheezing on normal respiration, no wheeze on forced expiration  Cardiovascular: RRR, heart sounds normal, no murmur or gallops, no peripheral edem  Musculoskeletal: No deformities, no cyanosis or clubbing  Neuro: alert, awake, non focal  Skin: Warm, no lesions or rash     Assessment & Plan:  COPD (chronic obstructive pulmonary disease) (HCC) No pulmonary function testing available but probable COPD based on tobacco history and symptoms.  She did not tolerate Trelegy due to powdered formulation.  We will  do a trial of Stiolto, add albuterol as needed.  Chronic cough Increase Protonix to twice daily, stop fish oil Continue valsartan as a substitute for ACE inhibitor Not currently having allergy symptoms or an allergy regimen.  Could consider going forward.  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  We are planning to repeat her CT scan of the chest in December.  Bronchoscopy was reassuring.    Baltazar Apo, MD, PhD 09/17/2020, 11:55 AM [facility] [phone] or if no answer before 7:00PM call [phone] For any issues after 7:00PM please call eLink [phone]

## 2020-09-17 NOTE — Patient Instructions (Addendum)
We willWe will plan to start Stiolto 2 puffs once daily.  Keep track of whether this medication helps your breathing and your coughing.  If so we will order through your pharmacy and plan to continue. Keep albuterol available to use 2 puffs if needed for shortness of breath, chest tightness, spells of coughing, wheezing. We will plan to perform pulmonary function testing at some point going forward.  We can talk about scheduling this next time. Temporarily increase your Protonix to twice a day.  Take this medication 1 hour around food. Temporarily stop your fish oil Continue your valsartan as ordered We will plan to repeat your CT scan of the chest without contrast in December Follow with Tamara [name] in 1 month or next available so that we can discuss your response to the medications and your symptoms.

## 2020-09-17 NOTE — Assessment & Plan Note (Signed)
No pulmonary function testing available but probable COPD based on tobacco history and symptoms.  She did not tolerate Trelegy due to powdered formulation.  We will do a trial of Stiolto, add albuterol as needed.

## 2020-09-17 NOTE — Progress Notes (Signed)
    Chronic Care Management Pharmacy Assistant   Name: DORAMAE SCHOENFELD  MRN: [id number] DOB: 11-Oct-1945  Patient called to reminded of her appointment with Junius Argyle, CPP on 09/18/2020 '@1530'$  via telephone.   No answer, left message of appointment date, time and type of appointment (either telephone or in person). Left message to have all medications, supplements, blood pressure and/or blood sugar logs available during appointment and to return call if need to reschedule.   Lynann Bologna, CPA/CMA Clinical Pharmacist Assistant Phone: [phone]

## 2020-09-17 NOTE — Assessment & Plan Note (Signed)
Most recent imaging consistent with a resolving pneumonia, rounded atelectasis.  We are planning to repeat her CT scan of the chest in December.  Bronchoscopy was reassuring.

## 2020-09-17 NOTE — Assessment & Plan Note (Signed)
Increase Protonix to twice daily, stop fish oil Continue valsartan as a substitute for ACE inhibitor Not currently having allergy symptoms or an allergy regimen.  Could consider going forward.

## 2020-09-18 ENCOUNTER — Ambulatory Visit (INDEPENDENT_AMBULATORY_CARE_PROVIDER_SITE_OTHER): Payer: Medicare HMO

## 2020-09-18 DIAGNOSIS — E785 Hyperlipidemia, unspecified: Secondary | ICD-10-CM

## 2020-09-18 DIAGNOSIS — I10 Essential (primary) hypertension: Secondary | ICD-10-CM

## 2020-09-18 NOTE — Progress Notes (Signed)
Chronic Care Management Pharmacy Note  09/18/2020 Name:  Tamara [name] MRN:  [id number] DOB:  December 19, 1945  Subjective: Tamara [name] is an 75 y.o. year old female who is a primary patient of [name], [place].  The CCM team was consulted for assistance with disease management and care coordination needs.    Engaged with patient by telephone for follow up visit in response to provider referral for pharmacy case management and/or care coordination services.   Consent to Services:  The patient was given information about Chronic Care Management services, agreed to services, and gave verbal consent prior to initiation of services.  Please see initial visit note for detailed documentation.   Patient Care Team: [name], PA-C as PCP - General (Family Medicine) Germaine Pomfret, [facility] as Pharmacist (Pharmacist)  Recent office visits: 07/28/20: Patient presented to [name], PA-C for follow-up. Levofloxacin, tizanidine stopped.  01/28/2020: Patient presented to [name], PA-C for follow-up. Trelegy, Fluconazole started.   Recent consult visits: 09/17/20: Patient presented to Dr. Lamonte Sakai (pulmonology) for COPD. Albuterol, Stiolto, Pantoprazole 40 mg twice daily.  07/18/20: Patient presented to Dr. Loanne Drilling (Endocrinology) for follow-up. A1c 7.4%. 40 units AM, 24 units HS 05/13/20: Patient presented to Dr. Lamonte Sakai (Pulmonology) for mass of left lung. Trelegy stopped due to worsening cough. Bronchoscopy planned.  04/08/20: Patient presented to Dr. Chase Caller (Pulmonology) for chronic cough. No medication changes made.   Hospital visits: 05/19/20: Patient hospitalized for bronchoscopy procedure. Atypical cells, but not maglinant. Repeat testing necessary.  03/19/20: Patient presented to Dr. Loanne Drilling (Endocrinology) for follow-up. A1c 7.5%. No medication changes made.   Objective:  Lab Results  Component Value Date   CREATININE 0.97 07/28/2020   BUN 15 07/28/2020   GFR 59.43 (L) 05/13/2020    GFRNONAA 63 11/15/2019   GFRAA 73 11/15/2019   NA 138 07/28/2020   K 4.8 07/28/2020   CALCIUM 11.0 (H) 07/28/2020   CO2 28 07/28/2020   GLUCOSE 197 (H) 07/28/2020    Lab Results  Component Value Date/Time   HGBA1C 7.4 (A) 07/18/2020 01:19 PM   HGBA1C 7.5 (A) 03/19/2020 01:27 PM   HGBA1C 10.8 (H) 03/22/2019 10:20 AM   HGBA1C 8.2 (H) 11/22/2018 12:00 AM   GFR 59.43 (L) 05/13/2020 02:38 PM   MICROALBUR 0.5 09/05/2017 08:17 AM   MICROALBUR 0.2 08/23/2016 08:25 AM    Last diabetic Eye exam:  Lab Results  Component Value Date/Time   HMDIABEYEEXA No Retinopathy 12/29/2017 12:00 AM    Last diabetic Foot exam: No results found for: HMDIABFOOTEX   Lab Results  Component Value Date   CHOL 181 07/28/2020   HDL 54 07/28/2020   LDLCALC 100 (H) 07/28/2020   TRIG 176 (H) 07/28/2020   CHOLHDL 3.4 07/28/2020    Hepatic Function Latest Ref Rng & Units 07/28/2020 11/15/2019 03/22/2019  Total Protein 6.1 - 8.1 g/dL 6.9 6.9 6.7  Albumin 3.6 - 5.1 g/dL - - -  AST 10 - 35 U/L $Remo'27 31 28  'IiNvf$ ALT 6 - 29 U/L $Remo'20 22 22  'KRaxI$ Alk Phosphatase 33 - 130 U/L - - -  Total Bilirubin 0.2 - 1.2 mg/dL 0.5 0.4 0.4  Bilirubin, Direct 0.0 - 0.2 mg/dL - - -    Lab Results  Component Value Date/Time   TSH 3.88 07/28/2020 10:26 AM   TSH 2.24 11/15/2019 03:23 PM   FREET4 1.2 11/22/2018 12:00 AM    CBC Latest Ref Rng & Units 07/28/2020 05/13/2020 04/08/2020  WBC 3.8 - 10.8 Thousand/uL 6.4  7.6 7.1  Hemoglobin 11.7 - 15.5 g/dL 10.7(L) 11.1(L) 11.2(L)  Hematocrit 35.0 - 45.0 % 34.5(L) 33.1(L) 33.3(L)  Platelets 140 - 400 Thousand/uL 154 159.0 154.0    No results found for: VD25OH  Clinical ASCVD: No  The 10-year ASCVD risk score Mikey Bussing DC Jr., et al., 2013) is: 29.4%   Values used to calculate the score:     Age: 50 years     Sex: Female     Is Non-Hispanic African American: No     Diabetic: Yes     Tobacco smoker: No     Systolic Blood Pressure: 366 mmHg     Is BP treated: Yes     HDL Cholesterol: 54 mg/dL      Total Cholesterol: 181 mg/dL    Depression screen [facility] 2/9 07/28/2020 01/28/2020 11/15/2019  Decreased Interest 0 0 0  Down, Depressed, Hopeless 0 0 0  PHQ - 2 Score 0 0 0  Altered sleeping 0 - -  Tired, decreased energy 0 - -  Change in appetite 0 - -  Feeling bad or failure about yourself  0 - -  Trouble concentrating 0 - -  Moving slowly or fidgety/restless 0 - -  Suicidal thoughts 0 - -  PHQ-9 Score 0 - -  Difficult doing work/chores Not difficult at all - -  Some recent data might be hidden     Social History   Tobacco Use  Smoking Status Former   Packs/day: 1.00   Years: 40.00   Pack years: 40.00   Types: Cigarettes   Quit date: 2006   Years since quitting: 16.6  Smokeless Tobacco Never   BP Readings from Last 3 Encounters:  09/17/20 126/74  07/28/20 136/74  07/18/20 130/80   Pulse Readings from Last 3 Encounters:  09/17/20 82  07/28/20 91  07/18/20 92   Wt Readings from Last 3 Encounters:  09/17/20 238 lb 6.4 oz (108.1 kg)  07/28/20 242 lb 1.6 oz (109.8 kg)  07/18/20 240 lb 6.4 oz (109 kg)   BMI Readings from Last 3 Encounters:  09/17/20 38.48 kg/m  07/28/20 39.08 kg/m  07/18/20 38.80 kg/m    Assessment/Interventions: Review of patient past medical history, allergies, medications, health status, including review of consultants reports, laboratory and other test data, was performed as part of comprehensive evaluation and provision of chronic care management services.   SDOH:  (Social Determinants of Health) assessments and interventions performed: Yes   SDOH Screenings   Alcohol Screen: Low Risk    Last Alcohol Screening Score (AUDIT): 0  Depression (PHQ2-9): Low Risk    PHQ-2 Score: 0  Financial Resource Strain: Medium Risk   Difficulty of Paying Living Expenses: Somewhat hard  Food Insecurity: No Food Insecurity   Worried About Charity fundraiser in the Last Year: Never true   Ran Out of Food in the Last Year: Never true  Housing: Low  Risk    Last Housing Risk Score: 0  Physical Activity: Inactive   Days of Exercise per Week: 0 days   Minutes of Exercise per Session: 0 min  Social Connections: Moderately Isolated   Frequency of Communication with Friends and Family: More than three times a week   Frequency of Social Gatherings with Friends and Family: More than three times a week   Attends Religious Services: Never   Marine scientist or Organizations: No   Attends Archivist Meetings: Never   Marital Status: Married  Stress: No Stress  Concern Present   Feeling of Stress : Not at all  Tobacco Use: Medium Risk   Smoking Tobacco Use: Former   Smokeless Tobacco Use: Never  Transportation Needs: No Transportation Needs   Lack of Transportation (Medical): No   Lack of Transportation (Non-Medical): No    CCM Care Plan  Allergies  Allergen Reactions   Penicillins Hives    Reaction: 1965    Medications Reviewed Today     Reviewed by Collene Gobble, MD (Physician) on 09/17/20 at 1154  Med List Status: <None>   Medication Order Taking? Sig Documenting Provider Last Dose Status Informant  Accu-Chek Softclix Lancets lancets [id number] Yes TEST BLOOD SUGAR TWICE DAILY. [name], PA-C Taking Active Self  Alcohol Swabs (B-D SINGLE USE SWABS REGULAR) PADS [id number] Yes USE  TO TEST BLOOD SUGAR TWICE DAILY [name], PA-C Taking Active   Ascorbic Acid (VITAMIN C) 1000 MG tablet [id number] Yes Take 1,000 mg by mouth daily. [provider] Taking Active Self  Cholecalciferol (VITAMIN D) 50 MCG (2000 UT) CAPS [id number] Yes Take 2,000 Units by mouth daily. [provider] Taking Active Self  DROPLET PEN NEEDLES 32G X 4 MM MISC [id number] Yes 1 Device by Other route daily. Renato Shin, MD Taking Active Self  furosemide (LASIX) 20 MG tablet [id number] Yes Take 1 tablet (20 mg total) by mouth daily as needed for fluid or edema. [name], PA-C Taking Active Self  hydrochlorothiazide  (HYDRODIURIL) 25 MG tablet [id number] Yes TAKE 1 TABLET (25 MG TOTAL) BY MOUTH DAILY. [name], PA-C Taking Active Self  Insulin NPH, Human,, Isophane, (NOVOLIN N FLEXPEN) 100 UNIT/ML Mayer Masker [id number] Yes 40 units each morning, and 24 units each evening, and pen needles (71mm x 32G) 1/day. Renato Shin, MD Taking Active   levothyroxine (SYNTHROID) 50 MCG tablet [id number] Yes Take 1 tablet (50 mcg total) by mouth daily before breakfast. [name], PA-C Taking Active   meloxicam (MOBIC) 7.5 MG tablet [id number] Yes Take 1 tablet (7.5 mg total) by mouth daily as needed for pain. Collene Gobble, MD Taking Active   metFORMIN (GLUCOPHAGE) 1000 MG tablet [id number] Yes TAKE 1 TABLET TWICE DAILY WITH MEALS [name], PA-C Taking Active   methocarbamol (ROBAXIN) 500 MG tablet [id number] Yes TAKE 1 TABLET EVERY 8 HOURS AS NEEDED FOR MUSCLE SPASMS. [name], PA-C Taking Active   nystatin (MYCOSTATIN/NYSTOP) powder [id number] Yes Apply 1 application topically daily as needed (yeast under belly). Collene Gobble, MD Taking Active   Omega-3 Fatty Acids (FISH OIL) 1000 MG CAPS [id number] Yes Take 1,000 mg by mouth daily. [provider] Taking Active Self  pantoprazole (PROTONIX) 40 MG tablet [id number] Yes TAKE 1 TABLET EVERY DAY [name], PA-C Taking Active Self  potassium chloride SA (KLOR-CON) 20 MEQ tablet [id number] Yes TAKE 1 TABLET EVERY DAY [name], PA-C Taking Active   rosuvastatin (CRESTOR) 20 MG tablet [id number] Yes TAKE 1 TABLET (20 MG TOTAL) BY MOUTH AT BEDTIME. [name], PA-C Taking Active Self  valsartan (DIOVAN) 80 MG tablet [id number] Yes Take 1 tablet (80 mg total) by mouth daily. 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 12/07/2017   Anemia of chronic disease 09/05/2017   Colon polyps 09/05/2017   Diabetes mellitus type 2 with complications, uncontrolled ([place]) 07/14/2016   Hypertension 07/14/2016   Hyperlipemia 07/14/2016   Bilateral lower extremity edema 07/14/2016   GERD (gastroesophageal reflux disease) 07/14/2016   Screening for colorectal cancer 07/14/2016    Immunization History  Administered Date(s) Administered   Fluad Quad(high Dose 65+) 09/20/2018, 10/16/2019   Influenza, High Dose Seasonal PF 10/16/2019   Influenza,inj,Quad PF,6+ Mos 10/18/2016, 09/29/2017   Pneumococcal Conjugate-13 10/16/2019   Pneumococcal Polysaccharide-23 03/26/2013    Conditions to be addressed/monitored:  Hypertension, Hyperlipidemia, Diabetes, GERD and COPD  There are no care plans that you recently modified to display for this patient.    Medication Assistance:  Will determine if patient assistance necessary for inhaler treatments  Patient's preferred pharmacy is:  [facility] - [place], [place] [address] Phone: [phone] Fax: [phone]  [facility] [address], [place] [address] Phone: [phone] Fax: [phone]  Uses pill box? Yes Pt endorses 100% compliance  We discussed: Current pharmacy is preferred with insurance plan and patient is satisfied with pharmacy services Patient decided to: Continue current medication management strategy  Care Plan and Follow Up Patient Decision:  Patient agrees to Care Plan and Follow-up.  Plan: Telephone follow up appointment with care management team member scheduled for:  01/28/21 at 3:00 PM  Doristine Section, [facility] [phone]

## 2020-10-16 ENCOUNTER — Other Ambulatory Visit: Payer: Self-pay | Admitting: Family Medicine

## 2020-10-16 ENCOUNTER — Ambulatory Visit (INDEPENDENT_AMBULATORY_CARE_PROVIDER_SITE_OTHER): Payer: Medicare HMO

## 2020-10-16 ENCOUNTER — Other Ambulatory Visit: Payer: Self-pay

## 2020-10-16 VITALS — BP 132/72 | HR 81 | Temp 98.0°F | Resp 16 | Ht 66.0 in | Wt 238.8 lb

## 2020-10-16 DIAGNOSIS — E119 Type 2 diabetes mellitus without complications: Secondary | ICD-10-CM

## 2020-10-16 DIAGNOSIS — Z780 Asymptomatic menopausal state: Secondary | ICD-10-CM | POA: Diagnosis not present

## 2020-10-16 DIAGNOSIS — Z0000 Encounter for general adult medical examination without abnormal findings: Secondary | ICD-10-CM

## 2020-10-16 DIAGNOSIS — Z23 Encounter for immunization: Secondary | ICD-10-CM | POA: Diagnosis not present

## 2020-10-16 NOTE — Patient Instructions (Signed)
Tamara [name] , Thank you for taking time to come for your Medicare Wellness Visit. I appreciate your ongoing commitment to your health goals. Please review the following plan we discussed and let me know if I can assist you in the future.   Screening recommendations/referrals: Colonoscopy: done 02/08/17; due 01/2022 Mammogram: done 03/07/18. Please call [phone] to schedule your mammogram and bone density screening.  Bone Density: done 03/07/18 Recommended yearly ophthalmology/optometry visit for glaucoma screening and checkup Recommended yearly dental visit for hygiene and checkup  Vaccinations: Influenza vaccine: done today Pneumococcal vaccine: done 10/16/19 Tdap vaccine: due Shingles vaccine: declined   Covid-19:declined  Advanced directives: Advance directive discussed with you today. Even though you declined this today please call our office should you change your mind and we can give you the proper paperwork for you to fill out.   Conditions/risks identified: Recommend drinking 6-8 glasses of water per day   Next appointment: Follow up in one year for your annual wellness visit    Preventive Care 65 Years and Older, Female Preventive care refers to lifestyle choices and visits with your health care provider that can promote health and wellness. What does preventive care include? A yearly physical exam. This is also called an annual well check. Dental exams once or twice a year. Routine eye exams. Ask your health care provider how often you should have your eyes checked. Personal lifestyle choices, including: Daily care of your teeth and gums. Regular physical activity. Eating a healthy diet. Avoiding tobacco and drug use. Limiting alcohol use. Practicing safe sex. Taking low-dose aspirin every day. Taking vitamin and mineral supplements as recommended by your health care provider. What happens during an annual well check? The services and screenings done by your health  care provider during your annual well check will depend on your age, overall health, lifestyle risk factors, and family history of disease. Counseling  Your health care provider may ask you questions about your: Alcohol use. Tobacco use. Drug use. Emotional well-being. Home and relationship well-being. Sexual activity. Eating habits. History of falls. Memory and ability to understand (cognition). Work and work Statistician. Reproductive health. Screening  You may have the following tests or measurements: Height, weight, and BMI. Blood pressure. Lipid and cholesterol levels. These may be checked every 5 years, or more frequently if you are over 53 years old. Skin check. Lung cancer screening. You may have this screening every year starting at age 42 if you have a 30-pack-year history of smoking and currently smoke or have quit within the past 15 years. Fecal occult blood test (FOBT) of the stool. You may have this test every year starting at age 58. Flexible sigmoidoscopy or colonoscopy. You may have a sigmoidoscopy every 5 years or a colonoscopy every 10 years starting at age 49. Hepatitis C blood test. Hepatitis B blood test. Sexually transmitted disease (STD) testing. Diabetes screening. This is done by checking your blood sugar (glucose) after you have not eaten for a while (fasting). You may have this done every 1-3 years. Bone density scan. This is done to screen for osteoporosis. You may have this done starting at age 53. Mammogram. This may be done every 1-2 years. Talk to your health care provider about how often you should have regular mammograms. Talk with your health care provider about your test results, treatment options, and if necessary, the need for more tests. Vaccines  Your health care provider may recommend certain vaccines, such as: Influenza vaccine. This is recommended every year.  Tetanus, diphtheria, and acellular pertussis (Tdap, Td) vaccine. You may need a Td  booster every 10 years. Zoster vaccine. You may need this after age 25. Pneumococcal 13-valent conjugate (PCV13) vaccine. One dose is recommended after age 33. Pneumococcal polysaccharide (PPSV23) vaccine. One dose is recommended after age 21. Talk to your health care provider about which screenings and vaccines you need and how often you need them. This information is not intended to replace advice given to you by your health care provider. Make sure you discuss any questions you have with your health care provider. Document Released: 01/31/2015 Document Revised: 09/24/2015 Document Reviewed: 11/05/2014 Elsevier Interactive Patient Education  2017 [place] Prevention in the Home Falls can cause injuries. They can happen to people of all ages. There are many things you can do to make your home safe and to help prevent falls. What can I do on the outside of my home? Regularly fix the edges of walkways and driveways and fix any cracks. Remove anything that might make you trip as you walk through a door, such as a raised step or threshold. Trim any bushes or trees on the path to your home. Use bright outdoor lighting. Clear any walking paths of anything that might make someone trip, such as rocks or tools. Regularly check to see if handrails are loose or broken. Make sure that both sides of any steps have handrails. Any raised decks and porches should have guardrails on the edges. Have any leaves, snow, or ice cleared regularly. Use sand or salt on walking paths during winter. Clean up any spills in your garage right away. This includes oil or grease spills. What can I do in the bathroom? Use night lights. Install grab bars by the toilet and in the tub and shower. Do not use towel bars as grab bars. Use non-skid mats or decals in the tub or shower. If you need to sit down in the shower, use a plastic, non-slip stool. Keep the floor dry. Clean up any water that spills on the floor  as soon as it happens. Remove soap buildup in the tub or shower regularly. Attach bath mats securely with double-sided non-slip rug tape. Do not have throw rugs and other things on the floor that can make you trip. What can I do in the bedroom? Use night lights. Make sure that you have a light by your bed that is easy to reach. Do not use any sheets or blankets that are too big for your bed. They should not hang down onto the floor. Have a firm chair that has side arms. You can use this for support while you get dressed. Do not have throw rugs and other things on the floor that can make you trip. What can I do in the kitchen? Clean up any spills right away. Avoid walking on wet floors. Keep items that you use a lot in easy-to-reach places. If you need to reach something above you, use a strong step stool that has a grab bar. Keep electrical cords out of the way. Do not use floor polish or wax that makes floors slippery. If you must use wax, use non-skid floor wax. Do not have throw rugs and other things on the floor that can make you trip. What can I do with my stairs? Do not leave any items on the stairs. Make sure that there are handrails on both sides of the stairs and use them. Fix handrails that are broken or loose.  Make sure that handrails are as long as the stairways. Check any carpeting to make sure that it is firmly attached to the stairs. Fix any carpet that is loose or worn. Avoid having throw rugs at the top or bottom of the stairs. If you do have throw rugs, attach them to the floor with carpet tape. Make sure that you have a light switch at the top of the stairs and the bottom of the stairs. If you do not have them, ask someone to add them for you. What else can I do to help prevent falls? Wear shoes that: Do not have high heels. Have rubber bottoms. Are comfortable and fit you well. Are closed at the toe. Do not wear sandals. If you use a stepladder: Make sure that it is  fully opened. Do not climb a closed stepladder. Make sure that both sides of the stepladder are locked into place. Ask someone to hold it for you, if possible. Clearly mark and make sure that you can see: Any grab bars or handrails. First and last steps. Where the edge of each step is. Use tools that help you move around (mobility aids) if they are needed. These include: Canes. Walkers. Scooters. Crutches. Turn on the lights when you go into a dark area. Replace any light bulbs as soon as they burn out. Set up your furniture so you have a clear path. Avoid moving your furniture around. If any of your floors are uneven, fix them. If there are any pets around you, be aware of where they are. Review your medicines with your doctor. Some medicines can make you feel dizzy. This can increase your chance of falling. Ask your doctor what other things that you can do to help prevent falls. This information is not intended to replace advice given to you by your health care provider. Make sure you discuss any questions you have with your health care provider. Document Released: 10/31/2008 Document Revised: 06/12/2015 Document Reviewed: 02/08/2014 Elsevier Interactive Patient Education  2017 [employer].

## 2020-10-16 NOTE — Progress Notes (Signed)
Subjective:   Tamara [name] is a 75 y.o. female who presents for Medicare Annual (Subsequent) preventive examination.  Review of Systems     Cardiac Risk Factors include: advanced age (>3men, >33 women);diabetes mellitus;dyslipidemia;hypertension;obesity (BMI >30kg/m2)     Objective:    Today's Vitals   10/16/20 1127 10/16/20 1138  BP: 132/72   Pulse: 81   Resp: 16   Temp: 98 F (36.7 C)   TempSrc: Oral   SpO2: 97%   Weight: 238 lb 12.8 oz (108.3 kg)   Height: 5\' 6"  (1.676 m)   PainSc:  6    Body mass index is 38.54 kg/m.  Advanced Directives 10/16/2020 05/19/2020 10/10/2018 02/08/2017  Does Patient Have a Medical Advance Directive? No No No No  Would patient like information on creating a medical advance directive? No - Patient declined No - Patient declined Yes (MAU/Ambulatory/Procedural Areas - Information given) No - Patient declined    Current Medications (verified) Outpatient Encounter Medications as of 10/16/2020  Medication Sig   Accu-Chek Softclix Lancets lancets TEST BLOOD SUGAR TWICE DAILY.   albuterol (VENTOLIN HFA) 108 (90 Base) MCG/ACT inhaler Inhale 2 puffs into the lungs every 6 (six) hours as needed for wheezing or shortness of breath.   Alcohol Swabs (B-D SINGLE USE SWABS REGULAR) PADS USE  TO TEST BLOOD SUGAR TWICE DAILY   Ascorbic Acid (VITAMIN C) 1000 MG tablet Take 1,000 mg by mouth daily.   Cholecalciferol (VITAMIN D) 50 MCG (2000 UT) CAPS Take 1,000 Units by mouth daily.   DROPLET PEN NEEDLES 32G X 4 MM MISC 1 Device by Other route daily.   furosemide (LASIX) 20 MG tablet Take 1 tablet (20 mg total) by mouth daily as needed for fluid or edema.   hydrochlorothiazide (HYDRODIURIL) 25 MG tablet TAKE 1 TABLET (25 MG TOTAL) BY MOUTH DAILY.   Insulin NPH, Human,, Isophane, (NOVOLIN N FLEXPEN) 100 UNIT/ML Kiwkpen 40 units each morning, and 24 units each evening, and pen needles (23mm x 32G) 1/day.   levothyroxine (SYNTHROID) 50 MCG tablet Take 1 tablet  (50 mcg total) by mouth daily before breakfast.   meloxicam (MOBIC) 7.5 MG tablet Take 1 tablet (7.5 mg total) by mouth daily as needed for pain.   metFORMIN (GLUCOPHAGE) 1000 MG tablet TAKE 1 TABLET TWICE DAILY WITH MEALS   methocarbamol (ROBAXIN) 500 MG tablet TAKE 1 TABLET EVERY 8 HOURS AS NEEDED FOR MUSCLE SPASMS.   nystatin (MYCOSTATIN/NYSTOP) powder Apply 1 application topically daily as needed (yeast under belly).   pantoprazole (PROTONIX) 40 MG tablet Take 1 tablet (40 mg total) by mouth 2 (two) times daily.   potassium chloride SA (KLOR-CON) 20 MEQ tablet TAKE 1 TABLET EVERY DAY   rosuvastatin (CRESTOR) 20 MG tablet TAKE 1 TABLET (20 MG TOTAL) BY MOUTH AT BEDTIME.   Tiotropium Bromide-Olodaterol (STIOLTO RESPIMAT) 2.5-2.5 MCG/ACT AERS Inhale 2 puffs into the lungs daily.   TRUE METRIX BLOOD GLUCOSE TEST test strip    valsartan (DIOVAN) 80 MG tablet Take 1 tablet (80 mg total) by mouth daily.   [DISCONTINUED] Omega-3 Fatty Acids (FISH OIL) 1000 MG CAPS Take 1,000 mg by mouth daily.   No facility-administered encounter medications on file as of 10/16/2020.    Allergies (verified) Penicillins   History: Past Medical History:  Diagnosis Date   Arthritis    knee and shoulders   Diabetes mellitus without complication ([place])    type II   Dyspnea    05/16/20 has had a cough for 2.5  years post Covid   Gastritis 09/05/2017   GERD (gastroesophageal reflux disease)    Hyperlipidemia    Hypertension    patient denies   Hypothyroidism    Panic attack    RUQ pain 03/09/2017   Per patient, she has had RUQ pain 4-5 years that has grown worse in the last few months.   Past Surgical History:  Procedure Laterality Date   BREAST BIOPSY Left 2008   CORE W/CLIP - NEG   BRONCHIAL BIOPSY  05/19/2020   Procedure: BRONCHIAL BIOPSIES;  Surgeon: Collene Gobble, MD;  Location: [facility] ENDOSCOPY;  Service: Pulmonary;;   BRONCHIAL BRUSHINGS  05/19/2020   Procedure: BRONCHIAL BRUSHINGS;  Surgeon: Collene Gobble, MD;  Location: [facility] ENDOSCOPY;  Service: Pulmonary;;   BRONCHIAL NEEDLE ASPIRATION BIOPSY  05/19/2020   Procedure: BRONCHIAL NEEDLE ASPIRATION BIOPSIES;  Surgeon: Collene Gobble, MD;  Location: [place];  Service: Pulmonary;;   BRONCHIAL WASHINGS  05/19/2020   Procedure: BRONCHIAL WASHINGS;  Surgeon: Collene Gobble, MD;  Location: [place];  Service: Pulmonary;;   COLONOSCOPY WITH PROPOFOL N/A 02/08/2017   Procedure: COLONOSCOPY WITH PROPOFOL;  Surgeon: Lollie Sails, MD;  Location: [facility] ENDOSCOPY;  Service: Endoscopy;  Laterality: N/A;   ESOPHAGOGASTRODUODENOSCOPY (EGD) WITH PROPOFOL N/A 02/08/2017   Procedure: ESOPHAGOGASTRODUODENOSCOPY (EGD) WITH PROPOFOL;  Surgeon: Lollie Sails, MD;  Location: [facility] ENDOSCOPY;  Service: Endoscopy;  Laterality: N/A;   TUBAL LIGATION     VIDEO BRONCHOSCOPY WITH ENDOBRONCHIAL NAVIGATION N/A 05/19/2020   Procedure: VIDEO BRONCHOSCOPY WITH ENDOBRONCHIAL NAVIGATION;  Surgeon: Collene Gobble, MD;  Location: [facility] ENDOSCOPY;  Service: Pulmonary;  Laterality: N/A;   Family History  Problem Relation Age of Onset   Diabetes Maternal Grandmother    Breast cancer Neg Hx    Social History   Socioeconomic History   Marital status: Married    Spouse name: Not on file   Number of children: 3   Years of education: Not on file   Highest education level: High school graduate  Occupational History   Occupation: retired  Tobacco Use   Smoking status: Former    Packs/day: 1.00    Years: 40.00    Pack years: 40.00    Types: Cigarettes    Quit date: 2006    Years since quitting: 16.7   Smokeless tobacco: Never   Tobacco comments:    Smoking cessation materials not required  Vaping Use   Vaping Use: Never used  Substance and Sexual Activity   Alcohol use: No   Drug use: No   Sexual activity: Not on file  Other Topics Concern   Not on file  Social History Narrative   Not on file   Social Determinants of Health   Financial Resource  Strain: Low Risk    Difficulty of Paying Living Expenses: Not very hard  Food Insecurity: No Food Insecurity   Worried About Charity fundraiser in the Last Year: Never true   [place] in the Last Year: Never true  Transportation Needs: No Transportation Needs   Lack of Transportation (Medical): No   Lack of Transportation (Non-Medical): No  Physical Activity: Inactive   Days of Exercise per Week: 0 days   Minutes of Exercise per Session: 0 min  Stress: No Stress Concern Present   Feeling of Stress : Not at all  Social Connections: Moderately Isolated   Frequency of Communication with Friends and Family: More than three times a week   Frequency of  Social Gatherings with Friends and Family: More than three times a week   Attends Religious Services: Never   Marine scientist or Organizations: No   Attends Music therapist: Never   Marital Status: Married    Tobacco Counseling Counseling given: No Tobacco comments: Smoking cessation materials not required   Clinical Intake:  Pre-visit preparation completed: Yes  Pain : 0-10 Pain Score: 6  Pain Type: Chronic pain Pain Location: Knee (foot) Pain Orientation: Left Pain Descriptors / Indicators: Aching, Sore Pain Onset: More than a month ago Pain Frequency: Constant     BMI - recorded: 38.54 Nutritional Status: BMI > 30  Obese Nutritional Risks: None Diabetes: Yes CBG done?: No Did pt. bring in CBG monitor from home?: No  How often do you need to have someone help you when you read instructions, pamphlets, or other written materials from your doctor or pharmacy?: 1 - Never  Nutrition Risk Assessment:  Has the patient had any N/V/D within the last 2 months?  No  Does the patient have any non-healing wounds?  No  Has the patient had any unintentional weight loss or weight gain?  No   Diabetes:  Is the patient diabetic?  Yes  If diabetic, was a CBG obtained today?  No  Did the patient bring  in their glucometer from home?  No  How often do you monitor your CBG's? daily.   Financial Strains and Diabetes Management:  Are you having any financial strains with the device, your supplies or your medication? No .  Does the patient want to be seen by Chronic Care Management for management of their diabetes?  No  Would the patient like to be referred to a Nutritionist or for Diabetic Management?  No   Diabetic Exams:  Diabetic Eye Exam: Completed per patient; will request eye exam from [facility]  Diabetic Foot Exam: Completed 07/18/20.   Interpreter Needed?: No  Information entered by :: Clemetine Marker LPN   Activities of Daily Living In your present state of health, do you have any difficulty performing the following activities: 10/16/2020 07/28/2020  Hearing? N N  Vision? N N  Difficulty concentrating or making decisions? N N  Walking or climbing stairs? Y Y  Dressing or bathing? N N  Doing errands, shopping? N N  Preparing Food and eating ? N -  Using the Toilet? N -  In the past six months, have you accidently leaked urine? N -  Do you have problems with loss of bowel control? N -  Managing your Medications? N -  Managing your Finances? N -  Housekeeping or managing your Housekeeping? N -  Some recent data might be hidden    Patient Care Team: [name], PA-C as PCP - General (Family Medicine) Germaine Pomfret, [facility] as Pharmacist (Pharmacist)  Indicate any recent Medical Services you may have received from other than [facility] providers in the past year (date may be approximate).     Assessment:   This is a routine wellness examination for [place].  Hearing/Vision screen Hearing Screening - Comments:: Pt denies hearing difficulty Vision Screening - Comments:: Annual vision screenings done at [facility] in [place] issues and exercise activities discussed: Current Exercise Habits: The patient does not participate in regular  exercise at present, Exercise limited by: orthopedic condition(s)   Goals Addressed             This Visit's Progress    DIET -  INCREASE WATER INTAKE   Not on track    Recommend drinking 6-8 glasses of water per day       Depression Screen PHQ 2/9 Scores 10/16/2020 07/28/2020 01/28/2020 11/15/2019 10/16/2019 07/24/2019 03/22/2019  PHQ - 2 Score 0 0 0 0 0 0 0  PHQ- 9 Score - 0 - - - 0 0    Fall Risk Fall Risk  10/16/2020 07/28/2020 01/28/2020 11/15/2019 10/16/2019  Falls in the past year? 0 0 0 0 0  Number falls in past yr: 0 0 0 0 0  Injury with Fall? 0 0 0 0 0  Risk for fall due to : No Fall Risks - - - No Fall Risks  Follow up Falls prevention discussed - Falls evaluation completed Falls evaluation completed Falls prevention discussed    FALL RISK PREVENTION PERTAINING TO THE HOME:  Any stairs in or around the home? No  If so, are there any without handrails? No  Home free of loose throw rugs in walkways, pet beds, electrical cords, etc? Yes  Adequate lighting in your home to reduce risk of falls? Yes   ASSISTIVE DEVICES UTILIZED TO PREVENT FALLS:  Life alert? No  Use of a cane, walker or w/c? No  Grab bars in the bathroom? No  Shower chair or bench in shower? No  Elevated toilet seat or a handicapped toilet? Yes   TIMED UP AND GO:  Was the test performed? Yes .  Length of time to ambulate 10 feet: 6 sec.   Gait steady and fast without use of assistive device  Cognitive Function: Normal cognitive status assessed by direct observation by this Nurse Health Advisor. No abnormalities found.       6CIT Screen 10/16/2019 10/10/2018  What Year? 0 points 0 points  What month? 0 points 0 points  What time? 0 points 0 points  Count back from 20 0 points 0 points  Months in reverse 0 points 0 points  Repeat phrase 2 points 0 points  Total Score 2 0    Immunizations Immunization History  Administered Date(s) Administered   Fluad Quad(high Dose 65+) 09/20/2018, 10/16/2019,  10/16/2020   Influenza, High Dose Seasonal PF 10/16/2019   Influenza,inj,Quad PF,6+ Mos 10/18/2016, 09/29/2017   Pneumococcal Conjugate-13 10/16/2019   Pneumococcal Polysaccharide-23 03/26/2013    TDAP status: Due, Education has been provided regarding the importance of this vaccine. Advised may receive this vaccine at local pharmacy or Health Dept. Aware to provide a copy of the vaccination record if obtained from local pharmacy or Health Dept. Verbalized acceptance and understanding.  Flu Vaccine status: Completed at today's visit  Pneumococcal vaccine status: Up to date  Covid-19 vaccine status: Declined, Education has been provided regarding the importance of this vaccine but patient still declined. Advised may receive this vaccine at local pharmacy or Health Dept.or vaccine clinic. Aware to provide a copy of the vaccination record if obtained from local pharmacy or Health Dept. Verbalized acceptance and understanding.  Qualifies for Shingles Vaccine? yes  Zostavax completed No   Shingrix Completed?: No.    Education has been provided regarding the importance of this vaccine. Patient has been advised to call insurance company to determine out of pocket expense if they have not yet received this vaccine. Advised may also receive vaccine at local pharmacy or Health Dept. Verbalized acceptance and understanding.  Screening Tests Health Maintenance  Topic Date Due   COVID-19 Vaccine (1) Never done   OPHTHALMOLOGY EXAM  01/24/2019   MAMMOGRAM  03/08/2019   Zoster Vaccines- Shingrix (1 of 2) 10/28/2020 (Originally 10/30/1996)   HEMOGLOBIN A1C  01/18/2021   FOOT EXAM  07/18/2021   COLONOSCOPY (Pts 45-40yrs Insurance coverage will need to be confirmed)  02/08/2022   INFLUENZA VACCINE  Completed   DEXA SCAN  Completed   Hepatitis C Screening  Completed   HPV VACCINES  Aged Out   TETANUS/TDAP  Discontinued    Health Maintenance  Health Maintenance Due  Topic Date Due   COVID-19  Vaccine (1) Never done   OPHTHALMOLOGY EXAM  01/24/2019   MAMMOGRAM  03/08/2019    Colorectal cancer screening: Type of screening: Colonoscopy. Completed 02/08/17. Repeat every 5 years  Mammogram status: Completed 03/07/18. Repeat every year  Bone Density status: Completed 03/07/18. Results reflect: Bone density results: OSTEOPENIA. Repeat every 2 years.  Lung Cancer Screening: (Low Dose CT Chest recommended if Age 55-80 years, 30 pack-year currently smoking OR have quit w/in 15years.) does not qualify.   Additional Screening:  Hepatitis C Screening: does qualify; Completed 08/23/16  Vision Screening: Recommended annual ophthalmology exams for early detection of glaucoma and other disorders of the eye. Is the patient up to date with their annual eye exam?  Yes  Who is the provider or what is the name of the office in which the patient attends annual eye exams? [facility] Screening: Recommended annual dental exams for proper oral hygiene  Community Resource Referral / Chronic Care Management: CRR required this visit?  No   CCM required this visit?  No      Plan:     I have personally reviewed and noted the following in the patient's chart:   Medical and social history Use of alcohol, tobacco or illicit drugs  Current medications and supplements including opioid prescriptions.  Functional ability and status Nutritional status Physical activity Advanced directives List of other physicians Hospitalizations, surgeries, and ER visits in previous 12 months Vitals Screenings to include cognitive, depression, and falls Referrals and appointments  In addition, I have reviewed and discussed with patient certain preventive protocols, quality metrics, and best practice recommendations. A written personalized care plan for preventive services as well as general preventive health recommendations were provided to patient.     Clemetine Marker, LPN   [id number]   Nurse  Notes: none

## 2020-10-17 DIAGNOSIS — E785 Hyperlipidemia, unspecified: Secondary | ICD-10-CM | POA: Diagnosis not present

## 2020-10-17 DIAGNOSIS — I10 Essential (primary) hypertension: Secondary | ICD-10-CM

## 2020-10-17 NOTE — Patient Instructions (Signed)
Visit Information It was great speaking with you today!  Please let me know if you have any questions about our visit.   Goals Addressed             This Visit's Progress    Monitor and Manage My Blood Sugar-Diabetes Type 2       Timeframe:  Long-Range Goal Priority:  High Start Date:  05/28/2020                            Expected End Date:  11/28/2021                     Follow Up within 90 days    -check blood sugar twice daily; before breakfast and before supper - check blood sugar if I feel it is too high or too low - enter blood sugar readings and medication or insulin into daily log - take the blood sugar meter to all doctor visits    Why is this important?   Checking your blood sugar at home helps to keep it from getting very high or very low.  Writing the results in a diary or log helps the doctor know how to care for you.  Your blood sugar log should have the time, date and the results.  Also, write down the amount of insulin or other medicine that you take.  Other information, like what you ate, exercise done and how you were feeling, will also be helpful.     Notes:      Track and Manage My Blood Pressure-Hypertension       Timeframe:  Long-Range Goal Priority:  High Start Date: 05/28/2020                             Expected End Date: 11/28/2021                      Follow Up within 90 days   - check blood pressure 3 times per week    Why is this important?   You won't feel high blood pressure, but it can still hurt your blood vessels.  High blood pressure can cause heart or kidney problems. It can also cause a stroke.  Making lifestyle changes like losing a little weight or eating less salt will help.  Checking your blood pressure at home and at different times of the day can help to control blood pressure.  If the doctor prescribes medicine remember to take it the way the doctor ordered.  Call the office if you cannot afford the medicine or if there are  questions about it.     Notes:         Patient Care Plan: General Pharmacy (Adult)     Problem Identified: Hypertension, Hyperlipidemia, Diabetes, GERD and COPD   Priority: High     Long-Range Goal: Patient-Specific Goal   Start Date: 05/28/2020  Expected End Date: 10/17/2021  This Visit's Progress: On track  Recent Progress: On track  Priority: High  Note:   Current Barriers:  Unable to achieve control of COPD  Unable to maintain control of blood pressure  Pharmacist Clinical Goal(s):  Patient will achieve control of COPD as evidenced by stable breathing per patient report maintain control of blood pressure as evidenced by BP less than 140/90  through collaboration with PharmD and provider.   Interventions:  1:1 collaboration with [name], PA-C regarding development and update of comprehensive plan of care as evidenced by provider attestation and co-signature Inter-disciplinary care team collaboration (see longitudinal plan of care) Comprehensive medication review performed; medication list updated in electronic medical record  Hypertension (BP goal <140/90) -Not ideally controlled -Current treatment: Furosmide 20 mg daily as needed Hydrochlorothiazide 25 mg daily  Valsartan 80 mg daily  -Medications previously tried: NA  -Patient with chronic, wet cough for 2+ years. Likely more related to pulmonary issues, but could consider switching to ARB to see if cough improves  -Current home readings: Has not been routinely monitoring -Denies hypotensive/hypertensive symptoms -Educated on Daily salt intake goal < 2300 mg; Importance of home blood pressure monitoring; -Counseled to monitor BP at home three times weekly, document, and provide log at future appointments -Recommended stopping lisinopril due to possible influence on chronic cough  -Recommended Olmesartan 20 mg daily (higher equivalent dose but patient has had elevated readings at past doctor's visits)   -Recommend BMP at next PCP follow-up.   Hyperlipidemia: (LDL goal < 100) -Controlled -Current treatment: Rosuvastatin 20 mg daily  -Medications previously tried: NA  -Educated on Importance of limiting foods high in cholesterol; -Recommended to continue current medication  Diabetes (A1c goal <8%) -Controlled -Current medications: Metformin 1000 mg twice daily  NPH 40 units AM, 24 units nightly  -Medications previously tried: NA  -Current home glucose readings fasting glucose: 99-125  post prandial glucose: NA -Denies hypoglycemic/hyperglycemic symptoms -Educated on Benefits of routine self-monitoring of blood sugar; -Counseled to check feet daily and get yearly eye exams -Recommended to continue current medication  COPD (Goal: control symptoms and prevent exacerbations) -Uncontrolled -Current treatment  Ventolin HFA 2 puffs every 6 hours as needed  Stiolto 2 puff daily  -Medications previously tried: Trelegy (worsened cough)  -Gold Grade: NA -Current COPD Classification:  B (high sx, <2 exacerbations/yr) -MMRC score: 2-3 per patient report. Gets short of breath walking within house, or walking to mailbox and back. Resolves with rest. -Pulmonary function testing: NA -Exacerbations requiring treatment in last 6 months: None -Patient denies consistent use of maintenance inhaler -Frequency of rescue inhaler use: NA -Counseled on Benefits of consistent maintenance inhaler use  -Patient would likely benefit from long-acting inhaler with different delivery mechanism that does not cause as much irritation.  -Recommended starting Stiolto 2 inhalations once daily (with PAP) -Recommend PFTs.   Hypothyroidism (Goal: Maintain stable thyroid function) -Controlled -Current treatment  Levothyroxine 25 mcg daily  -Medications previously tried: NA  -Recommended to continue current medication  GERD (Goal: Prevent heartburn/reflux) -Controlled -Current treatment  Pantoprazole 40 mg  twice daily  -Medications previously tried: NA -Patient with chronic cough of unclear etiology. Per patient reflux symptoms have been well controlled.    -Recommended to continue current medication  Patient Goals/Self-Care Activities Patient will:  - check glucose twice daily; before breakfast and supper, document, and provide at future appointments check blood pressure three times weekly, document, and provide at future appointments  Follow Up Plan: Telephone follow up appointment with care management team member scheduled for:  01/28/21 at 3:00 PM      Patient agreed to services and verbal consent obtained.   The patient verbalized understanding of instructions, educational materials, and care plan provided today and declined offer to receive copy of patient instructions, educational materials, and care plan.   Malva Limes, [facility] [phone]

## 2020-10-23 ENCOUNTER — Ambulatory Visit: Payer: Medicare HMO | Admitting: Emergency Medicine

## 2020-11-11 ENCOUNTER — Other Ambulatory Visit: Payer: Self-pay | Admitting: Family Medicine

## 2020-11-11 DIAGNOSIS — B372 Candidiasis of skin and nail: Secondary | ICD-10-CM

## 2020-11-11 NOTE — Telephone Encounter (Signed)
Requested medications are due for refill today.  yes  Requested medications are on the active medications list.  yes  Last refill. 05/19/2020  Future visit scheduled.   yes  Notes to clinic.  No protocol for this medication.

## 2020-11-20 ENCOUNTER — Encounter: Payer: Self-pay | Admitting: Emergency Medicine

## 2020-11-20 ENCOUNTER — Ambulatory Visit: Payer: Medicare HMO | Admitting: Emergency Medicine

## 2020-11-20 ENCOUNTER — Other Ambulatory Visit: Payer: Self-pay

## 2020-11-20 DIAGNOSIS — R053 Chronic cough: Secondary | ICD-10-CM

## 2020-11-20 DIAGNOSIS — J984 Other disorders of lung: Secondary | ICD-10-CM

## 2020-11-20 MED ORDER — BEVESPI AEROSPHERE 9-4.8 MCG/ACT IN AERO: 2.0000 | INHALATION_SPRAY | Freq: Two times a day (BID) | RESPIRATORY_TRACT | 11 refills | Status: DC

## 2020-11-20 NOTE — Progress Notes (Signed)
Subjective:    Patient ID: Tamara [name], female    DOB: 08-18-1945, 75 y.o.   MRN: [id number]  HPI  ROV 09/17/20 --75 year old former smoker who follows up today for her chronic cough, probable COPD and abnormal CT scan of the chest.  We evaluated a medial left lower lobe opacity by bronchoscopy in May, no definitive diagnosis of malignancy.  I treated her with antibiotics and a subsequent PET scan 06/25/2020 showed no significant change in the masslike opacity and only mild hypermetabolism, question resolving pneumonia.  I treated her with another round of Levaquin in June because she had persistent sputum and cough.  Also changed her ACE inhibitor to valsartan.  She has been on Trelegy but stopped it months ago, PFT have not been done yet. Today she reports that her cough is unchanged > she has cough in the am when she wakes up, clear to yellow mucous. SOB with chores and housework.  On protonix qd, remains on fish oil. ACE-I to valsartan  ROV 11/20/20 --follow-up visit 75 year old former smoker with a history of suspected COPD, chronic cough.  We had been following a medial left lower lobe opacity that was negative for malignancy on bronchoscopy 05/2020.  Subsequent PET scan did not show hypermetabolism, question evolving rounded atelectasis post pneumonia.  She has continued to deal with chronic cough, changed her ACE inhibitor to valsartan.  Remains on pantoprazole.  I increase the pantoprazole to twice daily, stopped her fish oil last time.  Also started [employer] as she had not tolerated Trelegy in the past. She thinks she got spiriva instead of stiolto,  today she reports that her cough was worse on the new med. Overall her cough is a bit better. She is using albuterol about once a day.    Review of Systems As per HPI     Objective:   Physical Exam Vitals:   11/20/20 1456  BP: (!) 116/56  Pulse: 100  Temp: 98.1 F (36.7 C)  TempSrc: Oral  SpO2: 95%  Weight: 238 lb 9.6 oz (108.2  kg)  Height: 5\' 6"  (1.676 m)   Gen: Pleasant, well-nourished, in no distress,  normal affect  ENT: No lesions,  mouth clear,  oropharynx clear, no postnasal drip  Neck: No JVD, no stridor  Lungs: No use of accessory muscles, focal inspiratory crackles left midlung, no wheezes  Cardiovascular: RRR, heart sounds normal, no murmur or gallops, no peripheral edem  Musculoskeletal: No deformities, no cyanosis or clubbing  Neuro: alert, awake, non focal  Skin: Warm, no lesions or rash     Assessment & Plan:  COPD (chronic obstructive pulmonary disease) ([place]) She has not had pulmonary function testing yet.  Presumed COPD based on clinical findings.  She did not benefit from [employer] (she believes it might have been Spiriva instead), made her cough more.  We will try Bevespi to see if this offers her clinical benefit.  Continue albuterol as needed.  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  She still has crackles on exam.  We are planning to repeat her CT chest for stability in December 2022.  Follow-up after.  Chronic cough Somewhat improved, continues to improve.  She did feel that her cough was exacerbated by the Stiolto.  Likewise she did not tolerate Trelegy.  Continue to treat her GERD with PPI twice daily.  ACE inhibitor was changed to valsartan.    Baltazar Apo, MD, PhD 11/20/2020, 3:12 PM  [facility] [phone] or if no answer before 7:00PM call [phone] For any issues after 7:00PM please call eLink [phone]

## 2020-11-20 NOTE — Assessment & Plan Note (Signed)
She has not had pulmonary function testing yet.  Presumed COPD based on clinical findings.  She did not benefit from [employer] (she believes it might have been Spiriva instead), made her cough more.  We will try Bevespi to see if this offers her clinical benefit.  Continue albuterol as needed.

## 2020-11-20 NOTE — Assessment & Plan Note (Signed)
Bronchoscopy negative, looks like evolving rounded atelectasis/scar.  She still has crackles on exam.  We are planning to repeat her CT chest for stability in December 2022.  Follow-up after.

## 2020-11-20 NOTE — Assessment & Plan Note (Signed)
Somewhat improved, continues to improve.  She did feel that her cough was exacerbated by the Stiolto.  Likewise she did not tolerate Trelegy.  Continue to treat her GERD with PPI twice daily.  ACE inhibitor was changed to valsartan.

## 2020-11-20 NOTE — Patient Instructions (Addendum)
We will try starting Bevespi 2 puffs twice a day. Keep your albuterol available to use 2 puffs when needed for shortness of breath, chest tightness, wheezing. We will plan to repeat your CT scan of the chest in December 2022.   Follow with Dr. Lamonte Sakai in December after your CT so we can review the results together.

## 2020-11-26 ENCOUNTER — Other Ambulatory Visit: Payer: Self-pay

## 2020-11-26 ENCOUNTER — Ambulatory Visit: Payer: Medicare HMO | Admitting: Endocrinology

## 2020-11-26 VITALS — BP 144/74 | HR 92 | Ht 66.0 in | Wt 237.2 lb

## 2020-11-26 DIAGNOSIS — E1165 Type 2 diabetes mellitus with hyperglycemia: Secondary | ICD-10-CM

## 2020-11-26 DIAGNOSIS — E119 Type 2 diabetes mellitus without complications: Secondary | ICD-10-CM

## 2020-11-26 DIAGNOSIS — Z794 Long term (current) use of insulin: Secondary | ICD-10-CM

## 2020-11-26 LAB — POCT GLYCOSYLATED HEMOGLOBIN (HGB A1C): Hemoglobin A1C: 7.3 % — AB (ref 4.0–5.6)

## 2020-11-26 NOTE — Progress Notes (Signed)
Subjective:    Patient ID: Tamara [name], female    DOB: Jan 03, 1946, 75 y.o.   MRN: [id number]  HPI Pt returns for f/u of diabetes mellitus: DM type: Insulin-requiring type 2 Dx'ed: 6301 Complications: none Therapy: insulin since 2021, and metformin.   GDM: never DKA: never Severe hypoglycemia: never Pancreatitis: never Pancreatic imaging: never SDOH: she cannot afford brand name meds.   Other: she also took insulin 2017-2018; she removed the need for insulin by losing 40 lbs; she declines weight loss surgery; she declines multiple daily injections.   Interval history: she brings her meter with her cbg's which I have reviewed today.  cbg's vary from 93-210. There is no trend throughout the day.  She takes insulin as rx'ed.   Past Medical History:  Diagnosis Date   Arthritis    knee and shoulders   Diabetes mellitus without complication ([place])    type II   Dyspnea    05/16/20 has had a cough for 2.5 years post Covid   Gastritis 09/05/2017   GERD (gastroesophageal reflux disease)    Hyperlipidemia    Hypertension    patient denies   Hypothyroidism    Panic attack    RUQ pain 03/09/2017   Per patient, she has had RUQ pain 4-5 years that has grown worse in the last few months.    Past Surgical History:  Procedure Laterality Date   BREAST BIOPSY Left 2008   CORE W/CLIP - NEG   BRONCHIAL BIOPSY  05/19/2020   Procedure: BRONCHIAL BIOPSIES;  Surgeon: Collene Gobble, MD;  Location: [facility] ENDOSCOPY;  Service: Pulmonary;;   BRONCHIAL BRUSHINGS  05/19/2020   Procedure: BRONCHIAL BRUSHINGS;  Surgeon: Collene Gobble, MD;  Location: [facility] ENDOSCOPY;  Service: Pulmonary;;   BRONCHIAL NEEDLE ASPIRATION BIOPSY  05/19/2020   Procedure: BRONCHIAL NEEDLE ASPIRATION BIOPSIES;  Surgeon: Collene Gobble, MD;  Location: [place];  Service: Pulmonary;;   BRONCHIAL WASHINGS  05/19/2020   Procedure: BRONCHIAL WASHINGS;  Surgeon: Collene Gobble, MD;  Location: [place];  Service: Pulmonary;;    COLONOSCOPY WITH PROPOFOL N/A 02/08/2017   Procedure: COLONOSCOPY WITH PROPOFOL;  Surgeon: Lollie Sails, MD;  Location[facility] ENDOSCOPY;  Service: Endoscopy;  Laterality: N/A;   ESOPHAGOGASTRODUODENOSCOPY (EGD) WITH PROPOFOL N/A 02/08/2017   Procedure: ESOPHAGOGASTRODUODENOSCOPY (EGD) WITH PROPOFOL;  Surgeon: Lollie Sails, MD;  Location: [facility] ENDOSCOPY;  Service: Endoscopy;  Laterality: N/A;   TUBAL LIGATION     VIDEO BRONCHOSCOPY WITH ENDOBRONCHIAL NAVIGATION N/A 05/19/2020   Procedure: VIDEO BRONCHOSCOPY WITH ENDOBRONCHIAL NAVIGATION;  Surgeon: Collene Gobble, MD;  Location: [place] ENDOSCOPY;  Service: Pulmonary;  Laterality: N/A;    Social History   Socioeconomic History   Marital status: Married    Spouse name: Not on file   Number of children: 3   Years of education: Not on file   Highest education level: High school graduate  Occupational History   Occupation: retired  Tobacco Use   Smoking status: Former    Packs/day: 1.00    Years: 40.00    Pack years: 40.00    Types: Cigarettes    Quit date: 2006    Years since quitting: 16.8   Smokeless tobacco: Never   Tobacco comments:    Smoking cessation materials not required  Vaping Use   Vaping Use: Never used  Substance and Sexual Activity   Alcohol use: No   Drug use: No   Sexual activity: Not on file  Other Topics Concern  Not on file  Social History Narrative   Not on file   Social Determinants of Health   Financial Resource Strain: Low Risk    Difficulty of Paying Living Expenses: Not hard at all  Food Insecurity: No Food Insecurity   Worried About Charity fundraiser in the Last Year: Never true   Arboriculturist in the Last Year: Never true  Transportation Needs: No Transportation Needs   Lack of Transportation (Medical): No   Lack of Transportation (Non-Medical): No  Physical Activity: Inactive   Days of Exercise per Week: 0 days   Minutes of Exercise per Session: 0 min  Stress: No Stress Concern  Present   Feeling of Stress : Not at all  Social Connections: Moderately Isolated   Frequency of Communication with Friends and Family: More than three times a week   Frequency of Social Gatherings with Friends and Family: More than three times a week   Attends Religious Services: Never   Marine scientist or Organizations: No   Attends Music therapist: Never   Marital Status: Married  Human resources officer Violence: Not At Risk   Fear of Current or Ex-Partner: No   Emotionally Abused: No   Physically Abused: No   Sexually Abused: No    Current Outpatient Medications on File Prior to Visit  Medication Sig Dispense Refill   Accu-Chek Softclix Lancets lancets TEST BLOOD SUGAR TWICE DAILY. 200 each 11   albuterol (VENTOLIN HFA) 108 (90 Base) MCG/ACT inhaler Inhale 2 puffs into the lungs every 6 (six) hours as needed for wheezing or shortness of breath. 8 g 6   Alcohol Swabs (B-D SINGLE USE SWABS REGULAR) PADS USE  TO TEST BLOOD SUGAR TWICE DAILY 200 each 3   Ascorbic Acid (VITAMIN C) 1000 MG tablet Take 1,000 mg by mouth daily.     Cholecalciferol (VITAMIN D) 50 MCG (2000 UT) CAPS Take 1,000 Units by mouth daily.     DROPLET PEN NEEDLES 32G X 4 MM MISC 1 Device by Other route daily. 100 each 3   furosemide (LASIX) 20 MG tablet Take 1 tablet (20 mg total) by mouth daily as needed for fluid or edema. 30 tablet 1   Glycopyrrolate-Formoterol (BEVESPI AEROSPHERE) 9-4.8 MCG/ACT AERO Inhale 2 puffs into the lungs 2 (two) times daily. 10.7 g 11   hydrochlorothiazide (HYDRODIURIL) 25 MG tablet TAKE 1 TABLET (25 MG TOTAL) BY MOUTH DAILY. 90 tablet 3   Insulin NPH, Human,, Isophane, (NOVOLIN N FLEXPEN) 100 UNIT/ML Kiwkpen 40 units each morning, and 24 units each evening, and pen needles (13mm x 32G) 1/day. 70 mL 3   levothyroxine (SYNTHROID) 50 MCG tablet Take 1 tablet (50 mcg total) by mouth daily before breakfast. 90 tablet 1   meloxicam (MOBIC) 7.5 MG tablet Take 1 tablet (7.5 mg  total) by mouth daily as needed for pain.     metFORMIN (GLUCOPHAGE) 1000 MG tablet TAKE 1 TABLET TWICE DAILY WITH MEALS 180 tablet 1   methocarbamol (ROBAXIN) 500 MG tablet TAKE 1 TABLET EVERY 8 HOURS AS NEEDED FOR MUSCLE SPASMS. 90 tablet 3   nystatin (MYCOSTATIN/NYSTOP) powder Apply 1 application topically daily as needed (yeast under belly).     pantoprazole (PROTONIX) 40 MG tablet Take 1 tablet (40 mg total) by mouth 2 (two) times daily. 90 tablet 3   potassium chloride SA (KLOR-CON) 20 MEQ tablet TAKE 1 TABLET EVERY DAY 90 tablet 1   rosuvastatin (CRESTOR) 20 MG tablet TAKE 1  TABLET (20 MG TOTAL) BY MOUTH AT BEDTIME. 90 tablet 3   Tiotropium Bromide-Olodaterol (STIOLTO RESPIMAT) 2.5-2.5 MCG/ACT AERS Inhale 2 puffs into the lungs daily. 4 g 0   TRUE METRIX BLOOD GLUCOSE TEST test strip      valsartan (DIOVAN) 80 MG tablet Take 1 tablet (80 mg total) by mouth daily. 90 tablet 1   No current facility-administered medications on file prior to visit.    Allergies  Allergen Reactions   Penicillins Hives    Reaction: 1965    Family History  Problem Relation Age of Onset   Diabetes Maternal Grandmother    Breast cancer Neg Hx     BP (!) 144/74 (BP Location: Left Arm, Patient Position: Sitting, Cuff Size: Normal)   Pulse 92   Ht 5\' 6"  (1.676 m)   Wt 237 lb 3.2 oz (107.6 kg)   SpO2 95%   BMI 38.29 kg/m    Review of Systems She denies hypoglycemia.      Objective:   Physical Exam Pulses: dorsalis pedis intact bilat.   MSK: no deformity of the feet CV: no leg edema Skin:  no ulcer on the feet.  normal color and temp on the feet. Neuro: sensation is intact to touch on the feet.    Lab Results  Component Value Date   CREATININE 0.97 07/28/2020   BUN 15 07/28/2020   NA 138 07/28/2020   K 4.8 07/28/2020   CL 101 07/28/2020   CO2 28 07/28/2020     Lab Results  Component Value Date   HGBA1C 7.3 (A) 11/26/2020      Assessment & Plan:  Insulin-requiring type 2 DM:  uncontrolled.    Patient Instructions  check your blood sugar once a day.  vary the time of day when you check, between before the 3 meals, and at bedtime.  also check if you have symptoms of your blood sugar being too high or too low.  please keep a record of the readings and bring it to your next appointment here (or you can bring the meter itself).  You can write it on any piece of paper.  please call us sooner if your blood sugar goes below 70, or if you have a lot of readings over 200.   Please change the insulin to 40 units each morning, and 24 units each evening, and continue the same metformin.   On this type of insulin schedule, you should eat meals on a regular schedule.  If a meal is missed or significantly delayed, your blood sugar could go low.   Please come back for a follow-up appointment in 4 months.

## 2020-11-26 NOTE — Patient Instructions (Addendum)
check your blood sugar once a day.  vary the time of day when you check, between before the 3 meals, and at bedtime.  also check if you have symptoms of your blood sugar being too high or too low.  please keep a record of the readings and bring it to your next appointment here (or you can bring the meter itself).  You can write it on any piece of paper.  please call us sooner if your blood sugar goes below 70, or if you have a lot of readings over 200.   Please change the insulin to 40 units each morning, and 24 units each evening, and continue the same metformin.   On this type of insulin schedule, you should eat meals on a regular schedule.  If a meal is missed or significantly delayed, your blood sugar could go low.   Please come back for a follow-up appointment in 4 months.

## 2020-11-29 DIAGNOSIS — E119 Type 2 diabetes mellitus without complications: Secondary | ICD-10-CM | POA: Insufficient documentation

## 2020-11-29 DIAGNOSIS — E1165 Type 2 diabetes mellitus with hyperglycemia: Secondary | ICD-10-CM | POA: Insufficient documentation

## 2020-12-10 ENCOUNTER — Other Ambulatory Visit: Payer: Self-pay | Admitting: Family Medicine

## 2020-12-10 DIAGNOSIS — E119 Type 2 diabetes mellitus without complications: Secondary | ICD-10-CM

## 2020-12-10 DIAGNOSIS — E079 Disorder of thyroid, unspecified: Secondary | ICD-10-CM

## 2020-12-17 ENCOUNTER — Other Ambulatory Visit: Payer: Self-pay | Admitting: Family Medicine

## 2020-12-17 DIAGNOSIS — E119 Type 2 diabetes mellitus without complications: Secondary | ICD-10-CM

## 2020-12-17 DIAGNOSIS — E785 Hyperlipidemia, unspecified: Secondary | ICD-10-CM

## 2020-12-24 ENCOUNTER — Other Ambulatory Visit: Payer: Medicare HMO

## 2020-12-29 ENCOUNTER — Other Ambulatory Visit: Payer: Self-pay | Admitting: Emergency Medicine

## 2021-01-01 ENCOUNTER — Telehealth: Payer: Self-pay | Admitting: Emergency Medicine

## 2021-01-02 ENCOUNTER — Ambulatory Visit: Payer: Medicare HMO | Admitting: Emergency Medicine

## 2021-01-02 ENCOUNTER — Telehealth: Payer: Self-pay

## 2021-01-02 NOTE — Telephone Encounter (Signed)
According to our records, Valsartan was sent to [facility] on 12/30/2020.  Lm for patient

## 2021-01-02 NOTE — Progress Notes (Signed)
Chronic Care Management Pharmacy Assistant   Name: Tamara [name]  MRN: [id number] DOB: [birth year]  Reason for Encounter: Diabetes Disease State Call  Recent office visits:  10/16/2020 Clemetine Marker, LPN (Clinical Support PCP) for Medicare Wellness Exam- Stopped: Omega-3 Fatty Acids 1000 mg daily, Bone Density Test ordered,   Recent consult visits:  11/20/2020 Baltazar Apo, MD (Pulmonary Disease) for Follow-up- Started: Glycopyrrolate-Formoterol 9-4.8  mcg 2 puffs daily, no orders placed, patient instructed to follow-up in December  11/26/2020 Renato Shin, MD (Endocrinology) for Follow-up- No medication changes noted, lab order placed, patient instructed to follow-up in 4 months  Hospital visits:  None in previous 6 months  Medications: Outpatient Encounter Medications as of 01/02/2021  Medication Sig   Accu-Chek Softclix Lancets lancets TEST BLOOD SUGAR TWICE DAILY.   albuterol (VENTOLIN HFA) 108 (90 Base) MCG/ACT inhaler Inhale 2 puffs into the lungs every 6 (six) hours as needed for wheezing or shortness of breath.   Alcohol Swabs (B-D SINGLE USE SWABS REGULAR) PADS USE  TO TEST BLOOD SUGAR TWICE DAILY   Ascorbic Acid (VITAMIN C) 1000 MG tablet Take 1,000 mg by mouth daily.   Cholecalciferol (VITAMIN D) 50 MCG (2000 UT) CAPS Take 1,000 Units by mouth daily.   DROPLET PEN NEEDLES 32G X 4 MM MISC 1 Device by Other route daily.   furosemide (LASIX) 20 MG tablet Take 1 tablet (20 mg total) by mouth daily as needed for fluid or edema.   Glycopyrrolate-Formoterol (BEVESPI AEROSPHERE) 9-4.8 MCG/ACT AERO Inhale 2 puffs into the lungs 2 (two) times daily.   hydrochlorothiazide (HYDRODIURIL) 25 MG tablet TAKE 1 TABLET (25 MG TOTAL) BY MOUTH DAILY.   Insulin NPH, Human,, Isophane, (NOVOLIN N FLEXPEN) 100 UNIT/ML Kiwkpen 40 units each morning, and 24 units each evening, and pen needles (18mm x 32G) 1/day.   levothyroxine (SYNTHROID) 50 MCG tablet TAKE 1 TABLET DAILY BEFORE BREAKFAST.    meloxicam (MOBIC) 7.5 MG tablet Take 1 tablet (7.5 mg total) by mouth daily as needed for pain.   metFORMIN (GLUCOPHAGE) 1000 MG tablet TAKE 1 TABLET TWICE DAILY WITH MEALS   methocarbamol (ROBAXIN) 500 MG tablet TAKE 1 TABLET EVERY 8 HOURS AS NEEDED FOR MUSCLE SPASMS.   nystatin (MYCOSTATIN/NYSTOP) powder Apply 1 application topically daily as needed (yeast under belly).   pantoprazole (PROTONIX) 40 MG tablet Take 1 tablet (40 mg total) by mouth 2 (two) times daily.   potassium chloride SA (KLOR-CON) 20 MEQ tablet TAKE 1 TABLET EVERY DAY   rosuvastatin (CRESTOR) 20 MG tablet TAKE 1 TABLET (20 MG TOTAL) BY MOUTH AT BEDTIME.   Tiotropium Bromide-Olodaterol (STIOLTO RESPIMAT) 2.5-2.5 MCG/ACT AERS Inhale 2 puffs into the lungs daily.   TRUE METRIX BLOOD GLUCOSE TEST test strip    valsartan (DIOVAN) 80 MG tablet TAKE 1 TABLET EVERY DAY   No facility-administered encounter medications on file as of 01/02/2021.   Care Gaps: COVID-19 Vaccine Zoster Vaccine Diabetic Eye Exam   Star Rating Drugs: Valsartan 80 mg Rosuvastatin 20 mg  last filled on 10/14/2020 for a 90-Day supply to [facility] Metformin 1000 mg last filled on 10/07/2020 for a 90-Day supply with [facility]: Lab Results  Component Value Date/Time   HGBA1C 7.3 (A) 11/26/2020 01:20 PM   HGBA1C 7.4 (A) 07/18/2020 01:19 PM   HGBA1C 10.8 (H) 03/22/2019 10:20 AM   HGBA1C 8.2 (H) 11/22/2018 12:00 AM   MICROALBUR 0.5 09/05/2017 08:17 AM   MICROALBUR 0.2 08/23/2016 08:25 AM  Kidney Function Lab Results  Component Value Date/Time   CREATININE 0.97 07/28/2020 10:26 AM   CREATININE 0.95 05/13/2020 02:38 PM   CREATININE 0.91 11/15/2019 03:23 PM   GFR 59.43 (L) 05/13/2020 02:38 PM   GFRNONAA 63 11/15/2019 03:23 PM   GFRAA 73 11/15/2019 03:23 PM    Current antihyperglycemic regimen:  Metformin 1000 mg 1 tablet twice daily Insulin NPH 40 units each morning, 24 units each evening,   What  recent interventions/DTPs have been made to improve glycemic control:  None ID  Have there been any recent hospitalizations or ED visits since last visit with CPP? No  Patient denies hypoglycemic symptoms, including Pale, Sweaty, Shaky, Hungry, Nervous/irritable, and Vision changes  Patient denies hyperglycemic symptoms, including blurry vision, excessive thirst, fatigue, polyuria, and weakness  How often are you checking your blood sugar? twice daily  What are your blood sugars ranging?  Fasting: 103 this morning  During the week, how often does your blood glucose drop below 70? Never  Are you checking your feet daily/regularly? Sometimes  Adherence Review: Is the patient currently on a STATIN medication? Yes Is the patient currently on ACE/ARB medication? Yes Does the patient have >5 day gap between last estimated fill dates? No  Patient reports that she is doing well. Patient denies any ill symptoms at this time. She reports that she is taking all medications as directed. The only refill that is is requesting is the refill for her Nystatin Powder, and she stated that the PCP office would not refill this for her as they advised PCP had never filled this medication for her before this was filled by another provider. After review of the patient's chart PCP did fill this medication for her in January and discontinued this 05/12/2020. Her Pulmonary provider sent in a prescription to her [facility] in May for this medication, but patient was not aware. Patient was also not aware that PCP had discontinued this medication as she uses it for a recurrent rash that she gets on her stomach. I did contact [facility] for the patient to verify that she does have a refill there for this medication, and advised the patient that if she needs this medication she can get this medication at [facility], and that her Pulmonary provider is the ordering provider. She stated she would contact them to find out why  they are the one's now ordering this for her vs her PCP. I advised her that she could contact her PCP's office back to discuss this with them if she would like. Patient does have a PCP appointment as well next month. Patient has no other concerns or issues at this time.  Patient has a telephone appointment with Junius Argyle 01/28/2021 @ 1500  12/21 LVM requesting patient to return my call  Lynann Bologna, [place] Pharmacist Assistant Phone: [phone]

## 2021-01-06 ENCOUNTER — Other Ambulatory Visit: Payer: Self-pay | Admitting: Family Medicine

## 2021-01-06 DIAGNOSIS — M545 Low back pain, unspecified: Secondary | ICD-10-CM

## 2021-01-06 DIAGNOSIS — B372 Candidiasis of skin and nail: Secondary | ICD-10-CM

## 2021-01-06 MED ORDER — DOXYCYCLINE HYCLATE 100 MG PO TABS: 100.0000 mg | ORAL_TABLET | Freq: Two times a day (BID) | ORAL | 0 refills | Status: DC

## 2021-01-06 NOTE — Telephone Encounter (Signed)
She can take doxycycline 100mg  bid x 7 days

## 2021-01-06 NOTE — Telephone Encounter (Signed)
Spoke with the pt and notified of response per Dr Lamonte Sakai  She verbalized understanding  Nothing further needed Rx was sent to preferred pharm

## 2021-01-06 NOTE — Telephone Encounter (Signed)
Called and spoke with pt to see if she ever received her valsartan Rx and she said that she had.  While speaking with pt, she said that she began to have burning in her chest which started about 3 days ago. States that she has also been wheezing a lot and also has been coughing a lot getting up dark yellow phlegm.  Pt denies any complaints of fever, states that she has not checked her temp but does not feel like she has a temp.  Pt said that she has been using her albuterol at least once a day. Asked pt if she was taking either Stiolto or Bevespi but pt said that she was not on either one due to the cost of the inhalers and said the only inhaler that she has is the albuterol inhaler. Along with using her albuterol inhaler, pt said she has been taking alka seltzer plus to see if that would help.  Pt is requesting an abx to be prescribed. Dr. Lamonte Sakai, please advise.

## 2021-01-07 NOTE — Telephone Encounter (Signed)
Called patient to let her know that the request for some of the meds was to soon for refills  and another one another provider done it. When I told her she said then just don;t refill .

## 2021-01-07 NOTE — Telephone Encounter (Signed)
Requested medications are due for refill today.  Unsure  Requested medications are on the active medications list.  All but the test strips.  Last refill. Test strips 10/09/2020, Nystatin 05/19/2020, alcohol prep wipes 07/23/2020 #20 with 3 refills, Robaxin 07/28/2020 #90 with 3 refills  Future visit scheduled.   yes  Notes to clinic.  Test strips rx was written by historical provider,  Nystatin was written by Dr. Lamonte Sakai,  Alcohol prep wipes do not have a protocol assigned, and  Robaxin is not delegated.    Requested Prescriptions  Pending Prescriptions Disp Refills   ACCU-CHEK GUIDE test strip [Pharmacy Med Name: ACCU-CHEK GUIDE   Strip] 200 strip     Sig: TEST BLOOD SUGAR TWICE DAILY     Endocrinology: Diabetes - Testing Supplies Passed - 01/06/2021  7:16 PM      Passed - Valid encounter within last 12 months    Recent Outpatient Visits           5 months ago Diabetes mellitus type 2 with complications, uncontrolled ([place])   [facility] [name], PA-C   11 months ago Primary hypertension   [facility] [name], PA-C   1 year ago Chest congestion   [facility] [name], PA-C   1 year ago Hyperlipidemia, unspecified hyperlipidemia type   [facility] [name], PA-C   1 year ago Encounter for screening mammogram for malignant neoplasm of breast   [facility] [name], PA-C       Future Appointments             In 3 weeks [name], PA-C [facility], [place]   In 1 month Byrum, Rose Fillers, MD [facility]   In 9 months  [facility]             nystatin (MYCOSTATIN/NYSTOP) powder [Pharmacy Med Name: NYSTATIN 100000 UNIT/GM Powder] 60 g     Sig: APPLY TO THE AFFECTED AREA(S) TOPICALLY THREE TIMES DAILY AS NEEDED FOR RECURRENT RASH OR INTERTRIGO     Off-Protocol Failed - 01/06/2021  7:16 PM      Failed -  Medication not assigned to a protocol, review manually.      Passed - Valid encounter within last 12 months    Recent Outpatient Visits           5 months ago Diabetes mellitus type 2 with complications, uncontrolled [facility])   [facility] [name], PA-C   11 months ago Primary hypertension   [facility] [name], PA-C   1 year ago Chest congestion   [facility] [name], PA-C   1 year ago Hyperlipidemia, unspecified hyperlipidemia type   [facility] [name], PA-C   1 year ago Encounter for screening mammogram for malignant neoplasm of breast   [facility] [name], PA-C       Future Appointments             In 3 weeks [name], PA-C [facility], [place]   In 1 month Byrum, Rose Fillers, MD [facility]   In 9 months  [facility]             Alcohol Swabs (DROPSAFE ALCOHOL PREP) 70 % PADS [Pharmacy Med Name: DROPSAFE ALCOHOL PREP PADS 70 % Pad] 200 each 3  Sig: USE TO TEST BLOOD SUGAR TWICE DAILY     Off-Protocol Failed - 01/06/2021  7:16 PM      Failed - Medication not assigned to a protocol, review manually.      Passed - Valid encounter within last 12 months    Recent Outpatient Visits           5 months ago Diabetes mellitus type 2 with complications, uncontrolled [facility])   [facility] [name]-C   11 months ago Primary hypertension   [facility] [name], PA-C   1 year ago Chest congestion   [facility] [name], PA-C   1 year ago Hyperlipidemia, unspecified hyperlipidemia type   [facility] [name], PA-C   1 year ago Encounter for screening mammogram for malignant neoplasm of breast   [facility] [name], PA-C       Future Appointments             In 3 weeks  [name], PA-C [facility], [place]   In 1 month Byrum, Rose Fillers, MD [facility]   In 9 months  [facility]             methocarbamol (ROBAXIN) 500 MG tablet [Pharmacy Med Name: METHOCARBAMOL 500 MG Tablet] 90 tablet 3    Sig: TAKE 1 TABLET EVERY 8 HOURS AS NEEDED FOR MUSCLE SPASMS.     Not Delegated - Analgesics:  Muscle Relaxants Failed - 01/06/2021  7:16 PM      Failed - This refill cannot be delegated      Passed - Valid encounter within last 6 months    Recent Outpatient Visits           5 months ago Diabetes mellitus type 2 with complications, uncontrolled [facility])   [facility] [name]-C   11 months ago Primary hypertension   [facility] [name], PA-C   1 year ago Chest congestion   [facility] [name], [facility]-C   1 year ago Hyperlipidemia, unspecified hyperlipidemia type   [facility] [name], PA-C   1 year ago Encounter for screening mammogram for malignant neoplasm of breast   [facility] [name], PA-C       Future Appointments             In 3 weeks [name], PA-C [facility], [place]   In 1 month Byrum, Rose Fillers, MD [place]   In 9 months  [place]

## 2021-01-27 ENCOUNTER — Telehealth: Payer: Self-pay

## 2021-01-27 NOTE — Progress Notes (Signed)
° ° °  Chronic Care Management Pharmacy Assistant   Name: Tamara [name]  MRN: [id number] DOB: Apr 05, 1945  Patient called to be reminded of her telephone appointment with Junius Argyle, CPP on 01/28/2021 @ 1500   No answer, left message of appointment date, time and type of appointment (either telephone or in person). Left message to have all medications, supplements, blood pressure and/or blood sugar logs available during appointment and to return call if need to reschedule.  Star Rating Drug: Valsartan 80 mg last filled on 08/19/2020 for a 90-Day supply with [place] Rosuvastatin 20 mg last filled on 10/14/2020 for a 90-Day supply with [place] Metformin 1000 mg last filled on 10/07/2020 for a 90-Day supply with [place]  Any gaps in medications fill history? Yes but system could not update due to patient is getting scripts from [place]: COVID-19 Vaccine Zoster Vaccines Diabetic Eye Exam [place] Pharmacist Assistant Phone: [phone]

## 2021-01-28 ENCOUNTER — Ambulatory Visit (INDEPENDENT_AMBULATORY_CARE_PROVIDER_SITE_OTHER): Payer: Medicare HMO

## 2021-01-28 DIAGNOSIS — E119 Type 2 diabetes mellitus without complications: Secondary | ICD-10-CM

## 2021-01-28 DIAGNOSIS — J449 Chronic obstructive pulmonary disease, unspecified: Secondary | ICD-10-CM

## 2021-01-28 NOTE — Progress Notes (Signed)
Chronic Care Management Pharmacy Note  02/02/2021 Name:  Tamara [name] MRN:  [id number] DOB:  Apr 05, 1945  Summary: Patient presents for CCM follow-up. Blood sugars remain well controlled. Reports infrequent use of PRN inhaler, but has been unable to find a long-acting inhaler that is tolerable and affordable.   Recommendations/Changes made from today's visit: Continue current medications  Plan: CPP follow-up 6 months  Subjective: Tamara [name] is an 76 y.o. year old female who is a primary patient of [name], [place].  The CCM team was consulted for assistance with disease management and care coordination needs.    Engaged with patient by telephone for follow up visit in response to provider referral for pharmacy case management and/or care coordination services.   Consent to Services:  The patient was given information about Chronic Care Management services, agreed to services, and gave verbal consent prior to initiation of services.  Please see initial visit note for detailed documentation.   Patient Care Team: [name], PA-C as PCP - General (Family Medicine) Germaine Pomfret, [facility] as Pharmacist (Pharmacist)  Recent office visits: 10/16/20: Patient presented to Clemetine Marker, LPN for AWV.   9/75/88: Patient presented to [name], PA-C for follow-up. Levofloxacin, tizanidine stopped.   Recent consult visits:  11/26/20: Patient presented to Dr. Loanne Drilling (Endocrinology) for follow-up. A1c 7.3%. Insulin 40 units AM, 24 units evening.  09/17/20: Patient presented to Dr. Lamonte Sakai (pulmonology) for COPD. Albuterol, Stiolto, Pantoprazole 40 mg twice daily.  07/18/20: Patient presented to Dr. Loanne Drilling (Endocrinology) for follow-up. A1c 7.4%. 40 units AM, 24 units HS  Hospital visits: 05/19/20: Patient hospitalized for bronchoscopy procedure. Atypical cells, but not maglinant. Repeat testing necessary.  03/19/20: Patient presented to Dr. Loanne Drilling (Endocrinology) for follow-up.  A1c 7.5%. No medication changes made.   Objective:  Lab Results  Component Value Date   CREATININE 0.97 07/28/2020   BUN 15 07/28/2020   GFR 59.43 (L) 05/13/2020   GFRNONAA 63 11/15/2019   GFRAA 73 11/15/2019   NA 138 07/28/2020   K 4.8 07/28/2020   CALCIUM 11.0 (H) 07/28/2020   CO2 28 07/28/2020   GLUCOSE 197 (H) 07/28/2020    Lab Results  Component Value Date/Time   HGBA1C 7.3 (A) 11/26/2020 01:20 PM   HGBA1C 7.4 (A) 07/18/2020 01:19 PM   HGBA1C 10.8 (H) 03/22/2019 10:20 AM   HGBA1C 8.2 (H) 11/22/2018 12:00 AM   GFR 59.43 (L) 05/13/2020 02:38 PM   MICROALBUR 0.5 09/05/2017 08:17 AM   MICROALBUR 0.2 08/23/2016 08:25 AM    Last diabetic Eye exam:  Lab Results  Component Value Date/Time   HMDIABEYEEXA No Retinopathy 12/29/2017 12:00 AM    Last diabetic Foot exam: No results found for: HMDIABFOOTEX   Lab Results  Component Value Date   CHOL 181 07/28/2020   HDL 54 07/28/2020   LDLCALC 100 (H) 07/28/2020   TRIG 176 (H) 07/28/2020   CHOLHDL 3.4 07/28/2020    Hepatic Function Latest Ref Rng & Units 07/28/2020 11/15/2019 03/22/2019  Total Protein 6.1 - 8.1 g/dL 6.9 6.9 6.7  Albumin 3.6 - 5.1 g/dL - - -  AST 10 - 35 U/L $Remo'27 31 28  'itTZJ$ ALT 6 - 29 U/L $Remo'20 22 22  'modAL$ Alk Phosphatase 33 - 130 U/L - - -  Total Bilirubin 0.2 - 1.2 mg/dL 0.5 0.4 0.4  Bilirubin, Direct 0.0 - 0.2 mg/dL - - -    Lab Results  Component Value Date/Time   TSH 3.88 07/28/2020 10:26 AM   TSH  2.24 11/15/2019 03:23 PM   FREET4 1.2 11/22/2018 12:00 AM    CBC Latest Ref Rng & Units 07/28/2020 05/13/2020 04/08/2020  WBC 3.8 - 10.8 Thousand/uL 6.4 7.6 7.1  Hemoglobin 11.7 - 15.5 g/dL 10.7(L) 11.1(L) 11.2(L)  Hematocrit 35.0 - 45.0 % 34.5(L) 33.1(L) 33.3(L)  Platelets 140 - 400 Thousand/uL 154 159.0 154.0    No results found for: VD25OH  Clinical ASCVD: No  The 10-year ASCVD risk score (Arnett DK, et al., 2019) is: 29.8%   Values used to calculate the score:     Age: 81 years     Sex: Female     Is  Non-Hispanic African American: No     Diabetic: Yes     Tobacco smoker: No     Systolic Blood Pressure: 166 mmHg     Is BP treated: Yes     HDL Cholesterol: 54 mg/dL     Total Cholesterol: 181 mg/dL    Depression screen [facility] 2/9 01/29/2021 10/16/2020 07/28/2020  Decreased Interest 0 0 0  Down, Depressed, Hopeless 0 0 0  PHQ - 2 Score 0 0 0  Altered sleeping 0 - 0  Tired, decreased energy 0 - 0  Change in appetite 0 - 0  Feeling bad or failure about yourself  0 - 0  Trouble concentrating 0 - 0  Moving slowly or fidgety/restless 0 - 0  Suicidal thoughts 0 - 0  PHQ-9 Score 0 - 0  Difficult doing work/chores Not difficult at all - Not difficult at all  Some recent data might be hidden     Social History   Tobacco Use  Smoking Status Former   Packs/day: 1.00   Years: 40.00   Pack years: 40.00   Types: Cigarettes   Quit date: 2006   Years since quitting: 17.0  Smokeless Tobacco Never  Tobacco Comments   Smoking cessation materials not required   BP Readings from Last 3 Encounters:  01/29/21 120/60  11/26/20 (!) 144/74  11/20/20 (!) 116/56   Pulse Readings from Last 3 Encounters:  01/29/21 (!) 105  11/26/20 92  11/20/20 100   Wt Readings from Last 3 Encounters:  01/29/21 235 lb 6.4 oz (106.8 kg)  11/26/20 237 lb 3.2 oz (107.6 kg)  11/20/20 238 lb 9.6 oz (108.2 kg)   BMI Readings from Last 3 Encounters:  01/29/21 37.99 kg/m  11/26/20 38.29 kg/m  11/20/20 38.51 kg/m    Assessment/Interventions: Review of patient past medical history, allergies, medications, health status, including review of consultants reports, laboratory and other test data, was performed as part of comprehensive evaluation and provision of chronic care management services.   SDOH:  (Social Determinants of Health) assessments and interventions performed: Yes SDOH Interventions    Flowsheet Row Most Recent Value  SDOH Interventions   Financial Strain Interventions Intervention Not Indicated        SDOH Screenings   Alcohol Screen: Low Risk    Last Alcohol Screening Score (AUDIT): 0  Depression (PHQ2-9): Low Risk    PHQ-2 Score: 0  Financial Resource Strain: Medium Risk   Difficulty of Paying Living Expenses: Somewhat hard  Food Insecurity: No Food Insecurity   Worried About Charity fundraiser in the Last Year: Never true   Ran Out of Food in the Last Year: Never true  Housing: Low Risk    Last Housing Risk Score: 0  Physical Activity: Inactive   Days of Exercise per Week: 0 days   Minutes of Exercise per Session:  0 min  Social Connections: Moderately Isolated   Frequency of Communication with Friends and Family: More than three times a week   Frequency of Social Gatherings with Friends and Family: More than three times a week   Attends Religious Services: Never   Marine scientist or Organizations: No   Attends Music therapist: Never   Marital Status: Married  Stress: No Stress Concern Present   Feeling of Stress : Not at all  Tobacco Use: Medium Risk   Smoking Tobacco Use: Former   Smokeless Tobacco Use: Never   Passive Exposure: Not on Pensions consultant Needs: No Transportation Needs   Lack of Transportation (Medical): No   Lack of Transportation (Non-Medical): No    CCM Care Plan  Allergies  Allergen Reactions   Penicillins Hives    Reaction: 1964    Medications Reviewed Today     Reviewed by Teodora Medici, DO (Physician) on 01/29/21 at 63  Med List Status: <None>   Medication Order Taking? Sig Documenting Provider Last Dose Status Informant  Accu-Chek Softclix Lancets lancets [id number] Yes TEST BLOOD SUGAR TWICE DAILY. [name], PA-C Taking Active Self  albuterol (VENTOLIN HFA) 108 (90 Base) MCG/ACT inhaler [id number] Yes Inhale 2 puffs into the lungs every 6 (six) hours as needed for wheezing or shortness of breath. Collene Gobble, MD Taking Active   Alcohol Swabs (B-D SINGLE USE SWABS REGULAR) PADS [id number]  Yes USE  TO TEST BLOOD SUGAR TWICE DAILY [name], PA-C Taking Active   Ascorbic Acid (VITAMIN C) 1000 MG tablet [id number] Yes Take 1,000 mg by mouth daily. [provider] Taking Active Self  Cholecalciferol (VITAMIN D) 50 MCG (2000 UT) CAPS [id number] Yes Take 1,000 Units by mouth daily. [provider] Taking Active Self  DROPLET PEN NEEDLES 32G X 4 MM MISC [id number] Yes 1 Device by Other route daily. Renato Shin, MD Taking Active Self  furosemide (LASIX) 20 MG tablet [id number] Yes Take 1 tablet (20 mg total) by mouth daily as needed for fluid or edema. [name], PA-C Taking Active Self  hydrochlorothiazide (HYDRODIURIL) 25 MG tablet [id number] Yes TAKE 1 TABLET (25 MG TOTAL) BY MOUTH DAILY. [name], PA-C Taking Active Self  Insulin NPH, Human,, Isophane, (NOVOLIN N FLEXPEN) 100 UNIT/ML Mayer Masker [id number] Yes 40 units each morning, and 24 units each evening, and pen needles (61mm x 32G) 1/day. Renato Shin, MD Taking Active   levothyroxine (SYNTHROID) 50 MCG tablet [id number] Yes TAKE 1 TABLET DAILY BEFORE BREAKFAST. [name], PA-C Taking Active   meloxicam (MOBIC) 7.5 MG tablet [id number] Yes Take 1 tablet (7.5 mg total) by mouth daily as needed for pain. Collene Gobble, MD Taking Active   metFORMIN (GLUCOPHAGE) 1000 MG tablet [id number] Yes TAKE 1 TABLET TWICE DAILY WITH MEALS [name], PA-C Taking Active   methocarbamol (ROBAXIN) 500 MG tablet [id number] Yes TAKE 1 TABLET EVERY 8 HOURS AS NEEDED FOR MUSCLE SPASMS. [name], PA-C Taking Active   nystatin (MYCOSTATIN/NYSTOP) powder [id number] Yes Apply 1 application topically daily as needed (yeast under belly). Collene Gobble, MD Taking Active   pantoprazole (PROTONIX) 40 MG tablet [id number] Yes Take 1 tablet (40 mg total) by mouth 2 (two) times daily. Collene Gobble, MD Taking Active   potassium chloride SA (KLOR-CON) 20 MEQ tablet [id number] Yes TAKE 1 TABLET EVERY DAY [name], PA-C Taking Active    rosuvastatin (CRESTOR) 20 MG tablet [id number] Yes TAKE 1 TABLET (20 MG TOTAL) BY MOUTH AT  BEDTIME. 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 12/07/2017   Anemia of chronic disease 09/05/2017   Colon polyps 09/05/2017   Hypertension 07/14/2016   Hyperlipemia 07/14/2016   Bilateral lower extremity edema 07/14/2016   GERD (gastroesophageal reflux disease) 07/14/2016   Screening for colorectal cancer 07/14/2016    Immunization History  Administered Date(s) Administered   Fluad Quad(high Dose 65+) 09/20/2018, 10/16/2019, 10/16/2020   Influenza, High Dose Seasonal PF 10/16/2019   Influenza,inj,Quad PF,6+ Mos 10/18/2016, 09/29/2017   Pneumococcal Conjugate-13 10/16/2019   Pneumococcal Polysaccharide-23 03/26/2013    Conditions to be addressed/monitored:  Hypertension, Hyperlipidemia, Diabetes, GERD and COPD  Care Plan : General Pharmacy (Adult)  Updates made by Germaine Pomfret, RPH since 02/02/2021 12:00 AM     Problem: Hypertension, Hyperlipidemia, Diabetes, GERD and COPD   Priority: High     Long-Range Goal: Patient-Specific Goal   Start Date: 05/28/2020  Expected End Date: 10/17/2021  This Visit's Progress: On track  Recent Progress: On track  Priority: High  Note:   Current Barriers:  Unable to achieve control of COPD  Unable to maintain control of blood pressure  Pharmacist Clinical Goal(s):  Patient will achieve control of COPD as evidenced by stable  breathing per patient report maintain control of blood pressure as evidenced by BP less than 140/90  through collaboration with PharmD and provider.   Interventions: 1:1 collaboration with [name], PA-C regarding development and update of comprehensive plan of care as evidenced by provider attestation and co-signature Inter-disciplinary care team collaboration (see longitudinal plan of care) Comprehensive medication review performed; medication list updated in electronic medical record  Hypertension (BP goal <140/90) -Uncontrolled -Current treatment: Furosmide 20 mg daily as needed Hydrochlorothiazide 25 mg daily  Valsartan 80 mg daily  -Medications previously tried: NA  -Current home readings: Has not been routinely monitoring -Denies hypotensive/hypertensive symptoms -Educated on Daily salt intake goal < 2300 mg; Importance of home blood pressure monitoring; -Counseled to monitor BP at home weekly, document, and provide log at future appointments  Hyperlipidemia: (LDL goal < 70) -Controlled -Current treatment: Rosuvastatin 20 mg daily  -Medications previously tried: NA  -Educated on Importance of limiting foods high in cholesterol; -Recommended to continue current medication  Diabetes (A1c goal <8%) -Controlled -Current medications: Metformin 1000 mg twice daily: Appropriate, Effective, Safe, Accessible NPH 40 units AM, 25 units nightly: Appropriate, Effective, Safe, Accessible -Medications previously tried: NA  -Current home glucose readings fasting glucose: 80-110 post prandial glucose: NA -Denies hypoglycemic/hyperglycemic symptoms -Educated on Benefits of routine self-monitoring of blood sugar; -Counseled to check feet daily and get yearly eye exams -Recommended to continue current medication  COPD (Goal: control symptoms and prevent exacerbations) -Controlled -Current treatment  Ventolin HFA 2 puffs every 6 hours as needed: Appropriate, Effective, Safe,  Accessible  -Medications previously tried: Trelegy (worsened cough)  -Gold Grade: NA -Current COPD Classification:  B (high sx, <2 exacerbations/yr) -  MMRC score: 2-3 per patient report. Gets short of breath walking within house, or walking to mailbox and back. Resolves with rest. -Pulmonary function testing: NA -Exacerbations requiring treatment in last 6 months: None -Patient denies consistent use of maintenance inhaler -Frequency of rescue inhaler use: 1-2 times monthly  -Counseled on Benefits of consistent maintenance inhaler use  -Continue current medications  Hypothyroidism (Goal: Maintain stable thyroid function) -Controlled -Current treatment  Levothyroxine 25 mcg daily  -Medications previously tried: NA  -Recommended to continue current medication  GERD (Goal: Prevent heartburn/reflux) -Controlled -Current treatment  Pantoprazole 40 mg twice daily  -Medications previously tried: NA -Patient with chronic cough of unclear etiology. Per patient reflux symptoms have been well controlled.    -Recommended to continue current medication  Patient Goals/Self-Care Activities Patient will:  - check glucose twice daily; before breakfast and supper, document, and provide at future appointments check blood pressure three times weekly, document, and provide at future appointments  Follow Up Plan: Telephone follow up appointment with care management team member scheduled for:  01/28/21 at 3:00 PM       Medication Assistance:  Will determine if patient assistance necessary for inhaler treatments  Patient's preferred pharmacy is:  [facility], [address] [place] [address] Phone: [phone] Fax: [phone]  [place] [address], [place] [address] Phone: [phone] Fax: [phone]   Uses pill box? Yes Pt endorses 100% compliance  We  discussed: Current pharmacy is preferred with insurance plan and patient is satisfied with pharmacy services Patient decided to: Continue current medication management strategy  Care Plan and Follow Up Patient Decision:  Patient agrees to Care Plan and Follow-up.  Plan: Telephone follow up appointment with care management team member scheduled for:  01/28/21 at 3:00 PM  Malva Limes, [place] Pharmacist Practitioner  [facility] [phone]

## 2021-01-29 ENCOUNTER — Encounter: Payer: Self-pay | Admitting: Internal Medicine

## 2021-01-29 ENCOUNTER — Ambulatory Visit (INDEPENDENT_AMBULATORY_CARE_PROVIDER_SITE_OTHER): Payer: Medicare HMO | Admitting: Internal Medicine

## 2021-01-29 VITALS — BP 120/60 | HR 105 | Temp 97.5°F | Resp 16 | Ht 66.0 in | Wt 235.4 lb

## 2021-01-29 DIAGNOSIS — E079 Disorder of thyroid, unspecified: Secondary | ICD-10-CM | POA: Diagnosis not present

## 2021-01-29 DIAGNOSIS — K219 Gastro-esophageal reflux disease without esophagitis: Secondary | ICD-10-CM

## 2021-01-29 DIAGNOSIS — E785 Hyperlipidemia, unspecified: Secondary | ICD-10-CM | POA: Diagnosis not present

## 2021-01-29 DIAGNOSIS — J449 Chronic obstructive pulmonary disease, unspecified: Secondary | ICD-10-CM

## 2021-01-29 DIAGNOSIS — Z1231 Encounter for screening mammogram for malignant neoplasm of breast: Secondary | ICD-10-CM | POA: Diagnosis not present

## 2021-01-29 DIAGNOSIS — E1165 Type 2 diabetes mellitus with hyperglycemia: Secondary | ICD-10-CM

## 2021-01-29 DIAGNOSIS — I10 Essential (primary) hypertension: Secondary | ICD-10-CM

## 2021-01-29 NOTE — Progress Notes (Signed)
Established Patient Office Visit  Subjective:  Patient ID: Tamara [name], female    DOB: 05/30/1945  Age: 76 y.o. MRN: [id number]  CC:  Chief Complaint  Patient presents with   Hypertension   Hyperlipidemia   Diabetes    See's endo   Hypothyroidism    See's endo    HPI Tamara [name] presents for follow up on chronic medical conditions.   Hypertension: -Medications: Valsartan 80, HCTZ 25, Lasix 20 PRN for swelling (rarely has to take) -Patient is compliant with above medications and reports no side effects. -Checking BP at home (average): doesn't check  -Denies any SOB, CP, vision changes, LE edema or symptoms of hypotension  Diabetes, Type 2: -Following with Endocrinology, last saw end of November  -Last A1c 11/22 7.3 -Medications: Metformin 1000 BID, NPH 40 -Patient is compliant with the above medications and reports no side effects.  -Eye exam: will schedule  -Foot exam: up to date -Statin: yes -PNA vaccine: yes -Denies symptoms of hypoglycemia, polyuria, polydipsia, numbness extremities, foot ulcers/trauma.   HLD: -Medications: Crestor 20 -Patient is compliant with above medication. She does have tolerable joint pains with the medication and would prefer not to increase it. -Last lipid panel: 7/22: TC 181, HDL 54, triglycerides 176, LDL 100  The 10-year ASCVD risk score (Arnett DK, et al., 2019) is: 29.8%   Values used to calculate the score:     Age: 23 years     Sex: Female     Is Non-Hispanic African American: No     Diabetic: Yes     Tobacco smoker: No     Systolic Blood Pressure: 720 mmHg     Is BP treated: Yes     HDL Cholesterol: 54 mg/dL     Total Cholesterol: 181 mg/dL  Hypothyroidism: -Following with Endocrinology  -Medications: Levothyroxine 50 -Patient is compliant with the above medication (s) at the above dose and reports no medication side effects.  -Denies weight changes, cold./heat intolerance, skin changes, anxiety/palpitations   -Last TSH: 7/22 3.88  COPD: -COPD status: controlled -Current medications: Albuterol PRN, not using lately  -Satisfied with current treatment?: yes -Oxygen use: no -Dyspnea frequency: with exertion -Cough frequency: no -Rescue inhaler frequency: rare -Limitation of activity: yes -Productive cough: no -Pneumovax: Up to Date -Influenza: Up to Date -Seeing Pulmonology every 3 months, planning on repeating CT scan the end of the month due to severe scarring from COVID pneumonia in 2020.  GERD: -Currently on Protonix 40  Health Maintenance: -Blood work up to date -Mammogram 2/20: [place] 1 -Colonoscopy 2019: repeat in 5 years   Past Medical History:  Diagnosis Date   Arthritis    knee and shoulders   Diabetes mellitus without complication ([place])    type II   Dyspnea    05/16/20 has had a cough for 2.5 years post Covid   Gastritis 09/05/2017   GERD (gastroesophageal reflux disease)    Hyperlipidemia    Hypertension    patient denies   Hypothyroidism    Panic attack    RUQ pain 03/09/2017   Per patient, she has had RUQ pain 4-5 years that has grown worse in the last few months.    Past Surgical History:  Procedure Laterality Date   BREAST BIOPSY Left 2008   CORE W/CLIP - NEG   BRONCHIAL BIOPSY  05/19/2020   Procedure: BRONCHIAL BIOPSIES;  Surgeon: Collene Gobble, MD;  Location: [facility] ENDOSCOPY;  Service: Pulmonary;;   BRONCHIAL BRUSHINGS  05/19/2020  Procedure: BRONCHIAL BRUSHINGS;  Surgeon: Collene Gobble, MD;  Location: [facility] ENDOSCOPY;  Service: Pulmonary;;   BRONCHIAL NEEDLE ASPIRATION BIOPSY  05/19/2020   Procedure: BRONCHIAL NEEDLE ASPIRATION BIOPSIES;  Surgeon: Collene Gobble, MD;  Location: [place];  Service: Pulmonary;;   BRONCHIAL WASHINGS  05/19/2020   Procedure: BRONCHIAL WASHINGS;  Surgeon: Collene Gobble, MD;  Location: [place];  Service: Pulmonary;;   COLONOSCOPY WITH PROPOFOL N/A 02/08/2017   Procedure: COLONOSCOPY WITH PROPOFOL;  Surgeon: Lollie Sails, MD;  Location: [facility] ENDOSCOPY;  Service: Endoscopy;  Laterality: N/A;   ESOPHAGOGASTRODUODENOSCOPY (EGD) WITH PROPOFOL N/A 02/08/2017   Procedure: ESOPHAGOGASTRODUODENOSCOPY (EGD) WITH PROPOFOL;  Surgeon: Lollie Sails, MD;  Location: [facility] ENDOSCOPY;  Service: Endoscopy;  Laterality: N/A;   TUBAL LIGATION     VIDEO BRONCHOSCOPY WITH ENDOBRONCHIAL NAVIGATION N/A 05/19/2020   Procedure: VIDEO BRONCHOSCOPY WITH ENDOBRONCHIAL NAVIGATION;  Surgeon: Collene Gobble, MD;  Location: [place] ENDOSCOPY;  Service: Pulmonary;  Laterality: N/A;    Family History  Problem Relation Age of Onset   Diabetes Maternal Grandmother    Breast cancer Neg Hx     Social History   Socioeconomic History   Marital status: Married    Spouse name: Not on file   Number of children: 3   Years of education: Not on file   Highest education level: High school graduate  Occupational History   Occupation: retired  Tobacco Use   Smoking status: Former    Packs/day: 1.00    Years: 40.00    Pack years: 40.00    Types: Cigarettes    Quit date: 2006    Years since quitting: 17.0   Smokeless tobacco: Never   Tobacco comments:    Smoking cessation materials not required  Vaping Use   Vaping Use: Never used  Substance and Sexual Activity   Alcohol use: No   Drug use: No   Sexual activity: Not on file  Other Topics Concern   Not on file  Social History Narrative   Not on file   Social Determinants of Health   Financial Resource Strain: Low Risk    Difficulty of Paying Living Expenses: Not hard at all  Food Insecurity: No Food Insecurity   Worried About Charity fundraiser in the Last Year: Never true   [place] in the Last Year: Never true  Transportation Needs: No Transportation Needs   Lack of Transportation (Medical): No   Lack of Transportation (Non-Medical): No  Physical Activity: Inactive   Days of Exercise per Week: 0 days   Minutes of Exercise per Session: 0 min  Stress: No Stress  Concern Present   Feeling of Stress : Not at all  Social Connections: Moderately Isolated   Frequency of Communication with Friends and Family: More than three times a week   Frequency of Social Gatherings with Friends and Family: More than three times a week   Attends Religious Services: Never   Marine scientist or Organizations: No   Attends Music therapist: Never   Marital Status: Married  Human resources officer Violence: Not At Risk   Fear of Current or Ex-Partner: No   Emotionally Abused: No   Physically Abused: No   Sexually Abused: No    Outpatient Medications Prior to Visit  Medication Sig Dispense Refill   Accu-Chek Softclix Lancets lancets TEST BLOOD SUGAR TWICE DAILY. 200 each 11   albuterol (VENTOLIN HFA) 108 (90 Base) MCG/ACT inhaler Inhale 2 puffs  into the lungs every 6 (six) hours as needed for wheezing or shortness of breath. 8 g 6   Alcohol Swabs (B-D SINGLE USE SWABS REGULAR) PADS USE  TO TEST BLOOD SUGAR TWICE DAILY 200 each 3   Ascorbic Acid (VITAMIN C) 1000 MG tablet Take 1,000 mg by mouth daily.     Cholecalciferol (VITAMIN D) 50 MCG (2000 UT) CAPS Take 1,000 Units by mouth daily.     DROPLET PEN NEEDLES 32G X 4 MM MISC 1 Device by Other route daily. 100 each 3   furosemide (LASIX) 20 MG tablet Take 1 tablet (20 mg total) by mouth daily as needed for fluid or edema. 30 tablet 1   hydrochlorothiazide (HYDRODIURIL) 25 MG tablet TAKE 1 TABLET (25 MG TOTAL) BY MOUTH DAILY. 90 tablet 3   Insulin NPH, Human,, Isophane, (NOVOLIN N FLEXPEN) 100 UNIT/ML Kiwkpen 40 units each morning, and 24 units each evening, and pen needles (68m x 32G) 1/day. 70 mL 3   levothyroxine (SYNTHROID) 50 MCG tablet TAKE 1 TABLET DAILY BEFORE BREAKFAST. 90 tablet 1   meloxicam (MOBIC) 7.5 MG tablet Take 1 tablet (7.5 mg total) by mouth daily as needed for pain.     metFORMIN (GLUCOPHAGE) 1000 MG tablet TAKE 1 TABLET TWICE DAILY WITH MEALS 180 tablet 1   methocarbamol (ROBAXIN) 500  MG tablet TAKE 1 TABLET EVERY 8 HOURS AS NEEDED FOR MUSCLE SPASMS. 90 tablet 3   nystatin (MYCOSTATIN/NYSTOP) powder Apply 1 application topically daily as needed (yeast under belly).     pantoprazole (PROTONIX) 40 MG tablet Take 1 tablet (40 mg total) by mouth 2 (two) times daily. 90 tablet 3   potassium chloride SA (KLOR-CON) 20 MEQ tablet TAKE 1 TABLET EVERY DAY 90 tablet 1   rosuvastatin (CRESTOR) 20 MG tablet TAKE 1 TABLET (20 MG TOTAL) BY MOUTH AT BEDTIME. 90 tablet 3   TRUE METRIX BLOOD GLUCOSE TEST test strip      valsartan (DIOVAN) 80 MG tablet TAKE 1 TABLET EVERY DAY 90 tablet 0   No facility-administered medications prior to visit.    Allergies  Allergen Reactions   Penicillins Hives    Reaction: 1965    ROS Review of Systems  Constitutional:  Negative for chills and fever.  Eyes:  Negative for visual disturbance.  Respiratory:  Negative for cough and shortness of breath.   Cardiovascular:  Negative for chest pain.  Gastrointestinal:  Negative for abdominal pain.  Neurological:  Negative for dizziness and headaches.     Objective:    Physical Exam Constitutional:      Appearance: Normal appearance.  HENT:     Head: Normocephalic and atraumatic.  Eyes:     Conjunctiva/sclera: Conjunctivae normal.  Cardiovascular:     Rate and Rhythm: Normal rate and regular rhythm.  Pulmonary:     Effort: Pulmonary effort is normal.     Breath sounds: Normal breath sounds.  Musculoskeletal:     Right lower leg: No edema.     Left lower leg: No edema.  Skin:    General: Skin is warm and dry.     Comments: Small skin tag on back of neck, would like removed  Neurological:     General: No focal deficit present.     Mental Status: She is alert. Mental status is at baseline.  Psychiatric:        Mood and Affect: Mood normal.        Behavior: Behavior normal.    BP 120/60  Pulse (!) 105    Temp (!) 97.5 F (36.4 C)    Resp 16    Ht _0  (1.676 m)    Wt 235 lb 6.4 oz  (106.8 kg)    SpO2 96%    BMI 37.99 kg/m  Wt Readings from Last 3 Encounters:  01/29/21 235 lb 6.4 oz (106.8 kg)  11/26/20 237 lb 3.2 oz (107.6 kg)  11/20/20 238 lb 9.6 oz (108.2 kg)     Health Maintenance Due  Topic Date Due   COVID-19 Vaccine (1) Never done   Zoster Vaccines- Shingrix (1 of 2) Never done   OPHTHALMOLOGY EXAM  01/24/2019   MAMMOGRAM  03/08/2019    There are no preventive care reminders to display for this patient.  Lab Results  Component Value Date   TSH 3.88 07/28/2020   Lab Results  Component Value Date   WBC 6.4 07/28/2020   HGB 10.7 (L) 07/28/2020   HCT 34.5 (L) 07/28/2020   MCV 85.6 07/28/2020   PLT 154 07/28/2020   Lab Results  Component Value Date   NA 138 07/28/2020   K 4.8 07/28/2020   CO2 28 07/28/2020   GLUCOSE 197 (H) 07/28/2020   BUN 15 07/28/2020   CREATININE 0.97 07/28/2020   BILITOT 0.5 07/28/2020   ALKPHOS 47 08/23/2016   AST 27 07/28/2020   ALT 20 07/28/2020   PROT 6.9 07/28/2020   ALBUMIN 4.4 08/23/2016   CALCIUM 11.0 (H) 07/28/2020   EGFR 62 07/28/2020   GFR 59.43 (L) 05/13/2020   Lab Results  Component Value Date   CHOL 181 07/28/2020   Lab Results  Component Value Date   HDL 54 07/28/2020   Lab Results  Component Value Date   LDLCALC 100 (H) 07/28/2020   Lab Results  Component Value Date   TRIG 176 (H) 07/28/2020   Lab Results  Component Value Date   CHOLHDL 3.4 07/28/2020   Lab Results  Component Value Date   HGBA1C 7.3 (A) 11/26/2020      Assessment & Plan:   1. Primary hypertension: BP at goal, continue current medications, labs reviewed. Follow up in 6 months.   2. Hyperlipidemia, unspecified hyperlipidemia type: Stable, reviewed last lipid panel and ASCVD risk, which is increased. She is hesitant to increase statin, will keep the same for now and continue to modify risk factors with lifestyle management.   3. Uncontrolled type 2 diabetes mellitus with hyperglycemia ([place]): Stable, following  with Endo.  4. Thyroid disease: Stable, following with Endo.  5. Chronic obstructive pulmonary disease, unspecified COPD type ([place]): Stable, following with Pulmonology for repeat CT due to lung scarring.  6. Gastroesophageal reflux disease without esophagitis: Stable, continue PPI.  7. Encounter for screening mammogram for malignant neoplasm of breast: Mammogram ordered today.  - MM 3D SCREEN BREAST BILATERAL; Future  Follow-up: Return in about 6 months (around 07/29/2021) for another appointment for mole removal .    Teodora Medici, DO

## 2021-01-29 NOTE — Patient Instructions (Signed)
It was great seeing you today!  Plan discussed at today's visit: -Mammogram ordered today  Follow up in: 6 months for medical appointment and whenever is convenient for you for the mole removal    Take care and let us know if you have any questions or concerns prior to your next visit.  Dr. Rosana Berger

## 2021-02-02 NOTE — Patient Instructions (Signed)
Visit Information It was great speaking with you today!  Please let me know if you have any questions about our visit.   Goals Addressed             This Visit's Progress    Monitor and Manage My Blood Sugar-Diabetes Type 2   On track    Timeframe:  Long-Range Goal Priority:  High Start Date:  05/28/2020                            Expected End Date:  11/28/2021                     Follow Up within 90 days    -check blood sugar twice daily; before breakfast and before supper - check blood sugar if I feel it is too high or too low - enter blood sugar readings and medication or insulin into daily log - take the blood sugar meter to all doctor visits    Why is this important?   Checking your blood sugar at home helps to keep it from getting very high or very low.  Writing the results in a diary or log helps the doctor know how to care for you.  Your blood sugar log should have the time, date and the results.  Also, write down the amount of insulin or other medicine that you take.  Other information, like what you ate, exercise done and how you were feeling, will also be helpful.     Notes:      Track and Manage My Blood Pressure-Hypertension   Not on track    Timeframe:  Long-Range Goal Priority:  High Start Date: 05/28/2020                             Expected End Date: 11/28/2021                      Follow Up within 90 days   - check blood pressure 3 times per week    Why is this important?   You won't feel high blood pressure, but it can still hurt your blood vessels.  High blood pressure can cause heart or kidney problems. It can also cause a stroke.  Making lifestyle changes like losing a little weight or eating less salt will help.  Checking your blood pressure at home and at different times of the day can help to control blood pressure.  If the doctor prescribes medicine remember to take it the way the doctor ordered.  Call the office if you cannot afford the  medicine or if there are questions about it.     Notes:         Patient Care Plan: General Pharmacy (Adult)     Problem Identified: Hypertension, Hyperlipidemia, Diabetes, GERD and COPD   Priority: High     Long-Range Goal: Patient-Specific Goal   Start Date: 05/28/2020  Expected End Date: 10/17/2021  This Visit's Progress: On track  Recent Progress: On track  Priority: High  Note:   Current Barriers:  Unable to achieve control of COPD  Unable to maintain control of blood pressure  Pharmacist Clinical Goal(s):  Patient will achieve control of COPD as evidenced by stable breathing per patient report maintain control of blood pressure as evidenced by BP less than 140/90  through collaboration with PharmD and provider.  Interventions: 1:1 collaboration with [name], PA-C regarding development and update of comprehensive plan of care as evidenced by provider attestation and co-signature Inter-disciplinary care team collaboration (see longitudinal plan of care) Comprehensive medication review performed; medication list updated in electronic medical record  Hypertension (BP goal <140/90) -Uncontrolled -Current treatment: Furosmide 20 mg daily as needed Hydrochlorothiazide 25 mg daily  Valsartan 80 mg daily  -Medications previously tried: NA  -Current home readings: Has not been routinely monitoring -Denies hypotensive/hypertensive symptoms -Educated on Daily salt intake goal < 2300 mg; Importance of home blood pressure monitoring; -Counseled to monitor BP at home weekly, document, and provide log at future appointments  Hyperlipidemia: (LDL goal < 70) -Controlled -Current treatment: Rosuvastatin 20 mg daily  -Medications previously tried: NA  -Educated on Importance of limiting foods high in cholesterol; -Recommended to continue current medication  Diabetes (A1c goal <8%) -Controlled -Current medications: Metformin 1000 mg twice daily: Appropriate, Effective,  Safe, Accessible NPH 40 units AM, 25 units nightly: Appropriate, Effective, Safe, Accessible -Medications previously tried: NA  -Current home glucose readings fasting glucose: 80-110 post prandial glucose: NA -Denies hypoglycemic/hyperglycemic symptoms -Educated on Benefits of routine self-monitoring of blood sugar; -Counseled to check feet daily and get yearly eye exams -Recommended to continue current medication  COPD (Goal: control symptoms and prevent exacerbations) -Controlled -Current treatment  Ventolin HFA 2 puffs every 6 hours as needed: Appropriate, Effective, Safe, Accessible  -Medications previously tried: Trelegy (worsened cough)  -Gold Grade: NA -Current COPD Classification:  B (high sx, <2 exacerbations/yr) -MMRC score: 2-3 per patient report. Gets short of breath walking within house, or walking to mailbox and back. Resolves with rest. -Pulmonary function testing: NA -Exacerbations requiring treatment in last 6 months: None -Patient denies consistent use of maintenance inhaler -Frequency of rescue inhaler use: 1-2 times monthly  -Counseled on Benefits of consistent maintenance inhaler use  -Continue current medications  Hypothyroidism (Goal: Maintain stable thyroid function) -Controlled -Current treatment  Levothyroxine 25 mcg daily  -Medications previously tried: NA  -Recommended to continue current medication  GERD (Goal: Prevent heartburn/reflux) -Controlled -Current treatment  Pantoprazole 40 mg twice daily  -Medications previously tried: NA -Patient with chronic cough of unclear etiology. Per patient reflux symptoms have been well controlled.    -Recommended to continue current medication  Patient Goals/Self-Care Activities Patient will:  - check glucose twice daily; before breakfast and supper, document, and provide at future appointments check blood pressure three times weekly, document, and provide at future appointments  Follow Up Plan: Telephone  follow up appointment with care management team member scheduled for:  01/28/21 at 3:00 PM      Patient agreed to services and verbal consent obtained.   The patient verbalized understanding of instructions, educational materials, and care plan provided today and declined offer to receive copy of patient instructions, educational materials, and care plan.   Tamara [name], [place] Pharmacist Practitioner  [facility] [phone]

## 2021-02-06 ENCOUNTER — Ambulatory Visit (INDEPENDENT_AMBULATORY_CARE_PROVIDER_SITE_OTHER)
Admission: RE | Admit: 2021-02-06 | Discharge: 2021-02-06 | Disposition: A | Discharge status: Home / Self Care | Payer: Medicare HMO | Source: Ambulatory Visit | Attending: Emergency Medicine | Admitting: Emergency Medicine

## 2021-02-06 ENCOUNTER — Other Ambulatory Visit: Payer: Self-pay

## 2021-02-06 DIAGNOSIS — R911 Solitary pulmonary nodule: Secondary | ICD-10-CM | POA: Diagnosis not present

## 2021-02-06 DIAGNOSIS — J984 Other disorders of lung: Secondary | ICD-10-CM

## 2021-02-06 DIAGNOSIS — J439 Emphysema, unspecified: Secondary | ICD-10-CM | POA: Diagnosis not present

## 2021-02-06 DIAGNOSIS — R918 Other nonspecific abnormal finding of lung field: Secondary | ICD-10-CM | POA: Diagnosis not present

## 2021-02-06 DIAGNOSIS — I700 Atherosclerosis of aorta: Secondary | ICD-10-CM | POA: Diagnosis not present

## 2021-02-13 ENCOUNTER — Other Ambulatory Visit: Payer: Self-pay

## 2021-02-13 ENCOUNTER — Ambulatory Visit: Payer: Medicare HMO | Admitting: Emergency Medicine

## 2021-02-13 ENCOUNTER — Encounter: Payer: Self-pay | Admitting: Emergency Medicine

## 2021-02-13 DIAGNOSIS — R053 Chronic cough: Secondary | ICD-10-CM

## 2021-02-13 DIAGNOSIS — J984 Other disorders of lung: Secondary | ICD-10-CM

## 2021-02-13 DIAGNOSIS — J449 Chronic obstructive pulmonary disease, unspecified: Secondary | ICD-10-CM

## 2021-02-13 NOTE — Assessment & Plan Note (Signed)
Briefly reviewed.  Plan to continue her same regimen.

## 2021-02-13 NOTE — Progress Notes (Signed)
Subjective:    Patient ID: Tamara [name], female    DOB: April 02, 1945, 76 y.o.   MRN: [id number]  HPI  ROV 09/17/20 --76 year old former smoker who follows up today for her chronic cough, probable COPD and abnormal CT scan of the chest.  We evaluated a medial left lower lobe opacity by bronchoscopy in May, no definitive diagnosis of malignancy.  I treated her with antibiotics and a subsequent PET scan 06/25/2020 showed no significant change in the masslike opacity and only mild hypermetabolism, question resolving pneumonia.  I treated her with another round of Levaquin in June because she had persistent sputum and cough.  Also changed her ACE inhibitor to valsartan.  She has been on Trelegy but stopped it months ago, PFT have not been done yet. Today she reports that her cough is unchanged > she has cough in the am when she wakes up, clear to yellow mucous. SOB with chores and housework.  On protonix qd, remains on fish oil. ACE-I to valsartan  ROV 11/20/20 --follow-up visit 76 year old former smoker with a history of suspected COPD, chronic cough.  We had been following a medial left lower lobe opacity that was negative for malignancy on bronchoscopy 05/2020.  Subsequent PET scan did not show hypermetabolism, question evolving rounded atelectasis post pneumonia.  She has continued to deal with chronic cough, changed her ACE inhibitor to valsartan.  Remains on pantoprazole.  I increase the pantoprazole to twice daily, stopped her fish oil last time.  Also started [employer] as she had not tolerated Trelegy in the past. She thinks she got spiriva instead of stiolto,  today she reports that her cough was worse on the new med. Overall her cough is a bit better. She is using albuterol about once a day.   ROV 02/13/21 --76 year old woman, former tobacco with suspected COPD and history of chronic cough.  She has a medial left lower lobe opacity, negative on PET scan for hypermetabolism, negative on bronchoscopy  05/2020, question rounded atelectasis. Overall stable. She does cough up some thick yellow especially in the morning. She was treated with doxy in mid Dec 2022.   CT chest 02/06/2021 reviewed by me shows a persistent cavitary airspace disease in the left lower lobe with associated volume loss, overall similar appearance to 06/25/2020 and 05/09/2020 although there is some progressive peripheral airspace disease that looks consistent with postobstructive pneumonitis   Review of Systems As per HPI     Objective:   Physical Exam Vitals:   02/13/21 1000  BP: 132/70  Pulse: 92  Temp: 97.9 F (36.6 C)  TempSrc: Oral  SpO2: 97%  Weight: 240 lb (108.9 kg)  Height: 5\' 6"  (1.676 m)   Gen: Pleasant, well-nourished, in no distress,  normal affect  ENT: No lesions,  mouth clear,  oropharynx clear, no postnasal drip  Neck: No JVD, no stridor  Lungs: No use of accessory muscles, focal inspiratory crackles left midlung, no wheezes  Cardiovascular: RRR, heart sounds normal, no murmur or gallops, no peripheral edem  Musculoskeletal: No deformities, no cyanosis or clubbing  Neuro: alert, awake, non focal  Skin: Warm, no lesions or rash     Assessment & Plan:  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  Overall stable compared with prior.  I do believe we need to continue to keep close surveillance.  We will plan a repeat CT in 6 months.  She will call me if she has any symptoms  of a recurrent pneumonia or bronchitis.  We did treat her with doxycycline in mid December.  COPD (chronic obstructive pulmonary disease) ([place]) Briefly reviewed.  Plan to continue her same regimen.  Chronic cough Continue same regimen    Baltazar Apo, MD, PhD 02/13/2021, 10:27 AM [facility] [phone] or if no answer before 7:00PM call [phone] For any issues after 7:00PM please call eLink [phone]

## 2021-02-13 NOTE — Addendum Note (Signed)
Addended by: Gavin Potters R on: 02/13/2021 10:51 AM   Modules accepted: Orders

## 2021-02-13 NOTE — Patient Instructions (Signed)
We reviewed your CT scan of the chest today.  The area of scarring in the left lower lobe appear stable. We will plan to repeat your CT in July 2023 Please continue your current medications as you have been taking them. Follow Dr. Lamonte Sakai in July after your CT or sooner if you have any problems.

## 2021-02-13 NOTE — Assessment & Plan Note (Signed)
Continue same regimen 

## 2021-02-13 NOTE — Assessment & Plan Note (Signed)
Continues to behave like rounded atelectasis.  Overall stable compared with prior.  I do believe we need to continue to keep close surveillance.  We will plan a repeat CT in 6 months.  She will call me if she has any symptoms of a recurrent pneumonia or bronchitis.  We did treat her with doxycycline in mid December.

## 2021-02-17 DIAGNOSIS — E119 Type 2 diabetes mellitus without complications: Secondary | ICD-10-CM

## 2021-02-17 DIAGNOSIS — J449 Chronic obstructive pulmonary disease, unspecified: Secondary | ICD-10-CM

## 2021-02-17 DIAGNOSIS — Z794 Long term (current) use of insulin: Secondary | ICD-10-CM

## 2021-03-26 ENCOUNTER — Ambulatory Visit: Payer: Medicare HMO | Admitting: Endocrinology

## 2021-03-26 ENCOUNTER — Other Ambulatory Visit: Payer: Self-pay

## 2021-03-26 VITALS — BP 120/84 | HR 81 | Ht 66.0 in | Wt 240.6 lb

## 2021-03-26 DIAGNOSIS — Z794 Long term (current) use of insulin: Secondary | ICD-10-CM | POA: Diagnosis not present

## 2021-03-26 DIAGNOSIS — E1165 Type 2 diabetes mellitus with hyperglycemia: Secondary | ICD-10-CM

## 2021-03-26 DIAGNOSIS — E119 Type 2 diabetes mellitus without complications: Secondary | ICD-10-CM

## 2021-03-26 LAB — POCT GLYCOSYLATED HEMOGLOBIN (HGB A1C): Hemoglobin A1C: 8.7 % — AB (ref 4.0–5.6)

## 2021-03-26 NOTE — Patient Instructions (Addendum)
Please increase the insulin to 46 units each morning, and 24 units each evening   ?Please come back for a follow-up appointment in 4 months.   ?

## 2021-03-26 NOTE — Progress Notes (Signed)
? ?Subjective:  ? ? Patient ID: Tamara [name], female    DOB: 05-Aug-1945, 76 y.o.   MRN: [id number] ? ?HPI ?Pt returns for f/u of diabetes mellitus: ?DM type: Insulin-requiring type 2 ?Dx'ed: 2006 ?Complications: none ?Therapy: insulin since 2021, and metformin.   ?GDM: never ?DKA: never ?Severe hypoglycemia: never ?Pancreatitis: never ?Pancreatic imaging: never ?SDOH: she cannot afford brand name meds.   ?Other: she also took insulin 2017-2018; she removed the need for insulin by losing 40 lbs; she declines weight loss surgery; she declines multiple daily injections.   ?Interval history: she brings her meter with her cbg's which I have reviewed today.  cbg's vary from 110-395. It is in general higher as the day goes on.  She takes insulin as rx'ed, but she stopped metformin due to arthralgias, which then resolved.   ?Past Medical History:  ?Diagnosis Date  ? Arthritis   ? knee and shoulders  ? Diabetes mellitus without complication ([place])   ? type II  ? Dyspnea   ? 05/16/20 has had a cough for 2.5 years post Covid  ? Gastritis 09/05/2017  ? GERD (gastroesophageal reflux disease)   ? Hyperlipidemia   ? Hypertension   ? patient denies  ? Hypothyroidism   ? Panic attack   ? RUQ pain 03/09/2017  ? Per patient, she has had RUQ pain 4-5 years that has grown worse in the last few months.  ? ? ?Past Surgical History:  ?Procedure Laterality Date  ? BREAST BIOPSY Left 2008  ? CORE W/CLIP - NEG  ? BRONCHIAL BIOPSY  05/19/2020  ? Procedure: BRONCHIAL BIOPSIES;  Surgeon: Collene Gobble, MD;  Location: [facility] ENDOSCOPY;  Service: Pulmonary;;  ? BRONCHIAL BRUSHINGS  05/19/2020  ? Procedure: BRONCHIAL BRUSHINGS;  Surgeon: Collene Gobble, MD;  Location: [facility] ENDOSCOPY;  Service: Pulmonary;;  ? BRONCHIAL NEEDLE ASPIRATION BIOPSY  05/19/2020  ? Procedure: BRONCHIAL NEEDLE ASPIRATION BIOPSIES;  Surgeon: Collene Gobble, MD;  Location: [facility] ENDOSCOPY;  Service: Pulmonary;;  ? BRONCHIAL WASHINGS  05/19/2020  ? Procedure: BRONCHIAL WASHINGS;   Surgeon: Collene Gobble, MD;  Location: [facility] ENDOSCOPY;  Service: Pulmonary;;  ? COLONOSCOPY WITH PROPOFOL N/A 02/08/2017  ? Procedure: COLONOSCOPY WITH PROPOFOL;  Surgeon: Lollie Sails, MD;  Location[facility] ENDOSCOPY;  Service: Endoscopy;  Laterality: N/A;  ? ESOPHAGOGASTRODUODENOSCOPY (EGD) WITH PROPOFOL N/A 02/08/2017  ? Procedure: ESOPHAGOGASTRODUODENOSCOPY (EGD) WITH PROPOFOL;  Surgeon: Lollie Sails, MD;  Location: [facility] ENDOSCOPY;  Service: Endoscopy;  Laterality: N/A;  ? TUBAL LIGATION    ? VIDEO BRONCHOSCOPY WITH ENDOBRONCHIAL NAVIGATION N/A 05/19/2020  ? Procedure: VIDEO BRONCHOSCOPY WITH ENDOBRONCHIAL NAVIGATION;  Surgeon: Collene Gobble, MD;  Location: [facility] ENDOSCOPY;  Service: Pulmonary;  Laterality: N/A;  ? ? ?Social History  ? ?Socioeconomic History  ? Marital status: Married  ?  Spouse name: Not on file  ? Number of children: 3  ? Years of education: Not on file  ? Highest education level: High school graduate  ?Occupational History  ? Occupation: retired  ?Tobacco Use  ? Smoking status: Former  ?  Packs/day: 1.00  ?  Years: 40.00  ?  Pack years: 40.00  ?  Types: Cigarettes  ?  Quit date: 2006  ?  Years since quitting: 17.2  ? Smokeless tobacco: Never  ? Tobacco comments:  ?  Smoking cessation materials not required  ?Vaping Use  ? Vaping Use: Never used  ?Substance and Sexual Activity  ? Alcohol use: No  ? Drug use: No  ?  Sexual activity: Not on file  ?Other Topics Concern  ? Not on file  ?Social History Narrative  ? Not on file  ? ?Social Determinants of Health  ? ?Financial Resource Strain: Medium Risk  ? Difficulty of Paying Living Expenses: Somewhat hard  ?Food Insecurity: No Food Insecurity  ? Worried About Charity fundraiser in the Last Year: Never true  ? Ran Out of Food in the Last Year: Never true  ?Transportation Needs: No Transportation Needs  ? Lack of Transportation (Medical): No  ? Lack of Transportation (Non-Medical): No  ?Physical Activity: Inactive  ? Days of Exercise per Week:  0 days  ? Minutes of Exercise per Session: 0 min  ?Stress: No Stress Concern Present  ? Feeling of Stress : Not at all  ?Social Connections: Moderately Isolated  ? Frequency of Communication with Friends and Family: More than three times a week  ? Frequency of Social Gatherings with Friends and Family: More than three times a week  ? Attends Religious Services: Never  ? Active Member of Clubs or Organizations: No  ? Attends Archivist Meetings: Never  ? Marital Status: Married  ?Intimate Partner Violence: Not At Risk  ? Fear of Current or Ex-Partner: No  ? Emotionally Abused: No  ? Physically Abused: No  ? Sexually Abused: No  ? ? ?Current Outpatient Medications on File Prior to Visit  ?Medication Sig Dispense Refill  ? Accu-Chek Softclix Lancets lancets TEST BLOOD SUGAR TWICE DAILY. 200 each 11  ? albuterol (VENTOLIN HFA) 108 (90 Base) MCG/ACT inhaler Inhale 2 puffs into the lungs every 6 (six) hours as needed for wheezing or shortness of breath. 8 g 6  ? Alcohol Swabs (B-D SINGLE USE SWABS REGULAR) PADS USE  TO TEST BLOOD SUGAR TWICE DAILY 200 each 3  ? Ascorbic Acid (VITAMIN C) 1000 MG tablet Take 1,000 mg by mouth daily.    ? Cholecalciferol (VITAMIN D) 50 MCG (2000 UT) CAPS Take 1,000 Units by mouth daily.    ? DROPLET PEN NEEDLES 32G X 4 MM MISC 1 Device by Other route daily. 100 each 3  ? furosemide (LASIX) 20 MG tablet Take 1 tablet (20 mg total) by mouth daily as needed for fluid or edema. 30 tablet 1  ? hydrochlorothiazide (HYDRODIURIL) 25 MG tablet TAKE 1 TABLET (25 MG TOTAL) BY MOUTH DAILY. 90 tablet 3  ? Insulin NPH, Human,, Isophane, (NOVOLIN N FLEXPEN) 100 UNIT/ML Kiwkpen 40 units each morning, and 24 units each evening, and pen needles (47m x 32G) 1/day. 70 mL 3  ? levothyroxine (SYNTHROID) 50 MCG tablet TAKE 1 TABLET DAILY BEFORE BREAKFAST. 90 tablet 1  ? meloxicam (MOBIC) 7.5 MG tablet Take 1 tablet (7.5 mg total) by mouth daily as needed for pain.    ? methocarbamol (ROBAXIN) 500 MG  tablet TAKE 1 TABLET EVERY 8 HOURS AS NEEDED FOR MUSCLE SPASMS. 90 tablet 3  ? nystatin (MYCOSTATIN/NYSTOP) powder Apply 1 application topically daily as needed (yeast under belly).    ? pantoprazole (PROTONIX) 40 MG tablet Take 1 tablet (40 mg total) by mouth 2 (two) times daily. 90 tablet 3  ? potassium chloride SA (KLOR-CON) 20 MEQ tablet TAKE 1 TABLET EVERY DAY 90 tablet 1  ? rosuvastatin (CRESTOR) 20 MG tablet TAKE 1 TABLET (20 MG TOTAL) BY MOUTH AT BEDTIME. 90 tablet 3  ? TRUE METRIX BLOOD GLUCOSE TEST test strip     ? valsartan (DIOVAN) 80 MG tablet TAKE 1 TABLET EVERY DAY  90 tablet 0  ? ?No current facility-administered medications on file prior to visit.  ? ? ?Allergies  ?Allergen Reactions  ? Penicillins Hives  ?  Reaction: 1965  ? ? ?Family History  ?Problem Relation Age of Onset  ? Diabetes Maternal Grandmother   ? Breast cancer Neg Hx   ? ? ?BP 120/84   Pulse 81   Ht '5\' 6"'$  (1.676 m)   Wt 240 lb 9.6 oz (109.1 kg)   SpO2 97%   BMI 38.83 kg/m?  ? ? ?Review of Systems ?She denies hypoglycemia ?   ?Objective:  ? Physical Exam ?Pulses: dorsalis pedis intact bilat.   ?MSK: no deformity of the feet ?CV: no leg edema ?Skin:  no ulcer on the feet.  normal color and temp on the feet. ?Neuro: sensation is intact to touch on the feet ? ? ?Lab Results  ?Component Value Date  ? HGBA1C 8.7 (A) 03/26/2021  ? ?   ?Assessment & Plan:  ?Insulin-requiring type 2 DM: uncontrolled.   ? ?Patient Instructions  ?Please increase the insulin to 46 units each morning, and 24 units each evening   ?Please come back for a follow-up appointment in 4 months.   ? ? ?

## 2021-03-30 ENCOUNTER — Ambulatory Visit (INDEPENDENT_AMBULATORY_CARE_PROVIDER_SITE_OTHER): Payer: Medicare HMO | Admitting: Internal Medicine

## 2021-03-30 VITALS — BP 130/74 | HR 98 | Temp 97.8°F | Resp 16 | Ht 66.0 in | Wt 240.1 lb

## 2021-03-30 DIAGNOSIS — L989 Disorder of the skin and subcutaneous tissue, unspecified: Secondary | ICD-10-CM

## 2021-03-30 DIAGNOSIS — D235 Other benign neoplasm of skin of trunk: Secondary | ICD-10-CM | POA: Diagnosis not present

## 2021-03-30 DIAGNOSIS — D224 Melanocytic nevi of scalp and neck: Secondary | ICD-10-CM | POA: Diagnosis not present

## 2021-03-30 DIAGNOSIS — D239 Other benign neoplasm of skin, unspecified: Secondary | ICD-10-CM | POA: Diagnosis not present

## 2021-03-30 NOTE — Patient Instructions (Signed)
Mole Excision ?Mole excision is a procedure to remove (excise) a mole from your skin. Most moles are noncancerous (are benign) and do not require treatment. Some moles are larger than usual or look like cancerous moles (atypical moles). ?You may have a mole excision if: ?You have an atypical mole that your health care provider thinks should be looked at under a microscope to see if it is cancerous (biopsy). ?You have a mole that is causing pain. ?You have a mole that you want removed because you do not like the way it looks. ?Tell a health care provider about: ?Any allergies you have. ?All medicines you are taking, including vitamins, herbs, eye drops, creams, and over-the-counter medicines. ?Any problems you or family members have had with anesthetic medicines. ?Any blood disorders you have. ?Any surgeries you have had. ?Any medical conditions you have. ?Whether you are pregnant or may be pregnant. ?What are the risks? ?Generally, this is a safe procedure. However, problems may occur, including: ?Excessive bleeding. ?Infection. ?Scarring. ?Allergic reactions to medicines. ?Damage to the skin or other tissues around the mole. ?What happens before the procedure? ?Ask your health care provider what steps will be taken to help prevent infection. These may include: ?Removing hair around the mole. ?Washing skin with germ-killing soap. ?What happens during the procedure? ?  ? ?You will be given a medicine to numb the area (local anesthetic). ?Your health care provider will outline the mole with ink and mark the center with a dot. This will serve as a guide during the procedure. ?Depending on the size of your mole, your health care provider will remove it using: ?A surgical blade. The mole will be cut out or shaved off (shave excision). ?A hollow tube with a sharp end (punch device). This may be used for larger moles. ?Your health care provider may use stitches (sutures) to close the wound in the skin where the mole was  removed (excision site). ?A bandage (dressing) may be applied over the area. ?The procedure may vary among health care providers and hospitals. ?What can I expect after procedure? ?Your blood pressure, heart rate, breathing rate, and blood oxygen level will be monitored until you leave the hospital or clinic. ?You may return to your normal activities as told by your health care provider. ?It is up to you to get any test results. If a sample will be tested in a lab, ask your health care provider or the department that is doing the procedure when your results will be ready. ?Talk with your health care provider about what your results mean. ?After the procedure, it is common to have: ?Mild pain. Your pain may increase as the anesthetic medicine wears off. ?Mild redness and swelling. ?Follow these instructions at home: ?Incision care ?Follow instructions from your health care provider about how to take care of your incision. Make sure you: ?Wash your hands with soap and water before you change your bandage (dressing). If soap and water are not available, use hand sanitizer. ?Change your dressing as told by your health care provider. ?Leave stitches (sutures), skin glue, or adhesive strips in place. These skin closures may need to stay in place for 2 weeks or longer. If adhesive strip edges start to loosen and curl up, you may trim the loose edges. Do not remove adhesive strips completely unless your health care provider tells you to do that. ?Check your incision area every day for signs of infection. Check for: ?More redness, swelling, or pain. ?Fluid or blood. ?  Warmth ?Pus or a bad smell. ?General instructions ?Take over-the-counter and prescription medicines only as told by your health care provider. ?Follow instructions from your health care provider about how to minimize scarring. Avoid sun exposure until the area has healed. Scarring should lessen over time. ?To help prevent scarring, make sure to cover your wound  with sunscreen of at least SPF 30 after the wound has healed and all skin closures have been removed or fallen off. ?Keep all follow-up visits as told by your health care provider. This is important. ?Contact a health care provider if: ?A mole grows back in the same place where a mole had been removed. ?You have a fever. ?You have more redness, swelling, or pain at the incision site. ?You have fluid or blood coming from your incision site. ?Your incision site feels warm to the touch. ?You have pus or a bad smell coming from your incision site. ?Your incision site feels numb for several days after the procedure. ?Summary ?Mole excision is a procedure to remove (excise) a mole from your skin. ?You will be given a medicine to numb the area (local anesthetic) during the procedure to remove the mole. ?After the procedure, it is common to have mild pain and redness. ?Wash your hands with soap and water before you change your bandage (dressing). If soap and water are not available, use hand sanitizer. ?Contact your health care provider if you have problems or questions. ?This information is not intended to replace advice given to you by your health care provider. Make sure you discuss any questions you have with your health care provider. ?Document Revised: 10/30/2019 Document Reviewed: 10/31/2019 ?Elsevier Patient Education ? [place]. ?  ?

## 2021-03-30 NOTE — Progress Notes (Signed)
Acute Office Visit  Subjective:    Patient ID: Tamara [name], female    DOB: 13-Jan-1946, 76 y.o.   MRN: [id number]  Chief Complaint  Patient presents with   Mole removal    HPI Patient is in today for mole removal.   Past Medical History:  Diagnosis Date   Arthritis    knee and shoulders   Diabetes mellitus without complication ([place])    type II   Dyspnea    05/16/20 has had a cough for 2.5 years post Covid   Gastritis 09/05/2017   GERD (gastroesophageal reflux disease)    Hyperlipidemia    Hypertension    patient denies   Hypothyroidism    Panic attack    RUQ pain 03/09/2017   Per patient, she has had RUQ pain 4-5 years that has grown worse in the last few months.    Past Surgical History:  Procedure Laterality Date   BREAST BIOPSY Left 2008   CORE W/CLIP - NEG   BRONCHIAL BIOPSY  05/19/2020   Procedure: BRONCHIAL BIOPSIES;  Surgeon: Collene Gobble, MD;  Location: [facility] ENDOSCOPY;  Service: Pulmonary;;   BRONCHIAL BRUSHINGS  05/19/2020   Procedure: BRONCHIAL BRUSHINGS;  Surgeon: Collene Gobble, MD;  Location: [facility] ENDOSCOPY;  Service: Pulmonary;;   BRONCHIAL NEEDLE ASPIRATION BIOPSY  05/19/2020   Procedure: BRONCHIAL NEEDLE ASPIRATION BIOPSIES;  Surgeon: Collene Gobble, MD;  Location: [place];  Service: Pulmonary;;   BRONCHIAL WASHINGS  05/19/2020   Procedure: BRONCHIAL WASHINGS;  Surgeon: Collene Gobble, MD;  Location: [place];  Service: Pulmonary;;   COLONOSCOPY WITH PROPOFOL N/A 02/08/2017   Procedure: COLONOSCOPY WITH PROPOFOL;  Surgeon: Lollie Sails, MD;  Location: [facility] ENDOSCOPY;  Service: Endoscopy;  Laterality: N/A;   ESOPHAGOGASTRODUODENOSCOPY (EGD) WITH PROPOFOL N/A 02/08/2017   Procedure: ESOPHAGOGASTRODUODENOSCOPY (EGD) WITH PROPOFOL;  Surgeon: Lollie Sails, MD;  Location: [facility] ENDOSCOPY;  Service: Endoscopy;  Laterality: N/A;   TUBAL LIGATION     VIDEO BRONCHOSCOPY WITH ENDOBRONCHIAL NAVIGATION N/A 05/19/2020   Procedure: VIDEO  BRONCHOSCOPY WITH ENDOBRONCHIAL NAVIGATION;  Surgeon: Collene Gobble, MD;  Location: [place] ENDOSCOPY;  Service: Pulmonary;  Laterality: N/A;    Family History  Problem Relation Age of Onset   Diabetes Maternal Grandmother    Breast cancer Neg Hx     Social History   Socioeconomic History   Marital status: Married    Spouse name: Not on file   Number of children: 3   Years of education: Not on file   Highest education level: High school graduate  Occupational History   Occupation: retired  Tobacco Use   Smoking status: Former    Packs/day: 1.00    Years: 40.00    Pack years: 40.00    Types: Cigarettes    Quit date: 2006    Years since quitting: 17.2   Smokeless tobacco: Never   Tobacco comments:    Smoking cessation materials not required  Vaping Use   Vaping Use: Never used  Substance and Sexual Activity   Alcohol use: No   Drug use: No   Sexual activity: Not on file  Other Topics Concern   Not on file  Social History Narrative   Not on file   Social Determinants of Health   Financial Resource Strain: Medium Risk   Difficulty of Paying Living Expenses: Somewhat hard  Food Insecurity: No Food Insecurity   Worried About Running Out of Food in the Last Year: Never true   Ran  Out of Food in the Last Year: Never true  Transportation Needs: No Transportation Needs   Lack of Transportation (Medical): No   Lack of Transportation (Non-Medical): No  Physical Activity: Inactive   Days of Exercise per Week: 0 days   Minutes of Exercise per Session: 0 min  Stress: No Stress Concern Present   Feeling of Stress : Not at all  Social Connections: Moderately Isolated   Frequency of Communication with Friends and Family: More than three times a week   Frequency of Social Gatherings with Friends and Family: More than three times a week   Attends Religious Services: Never   Marine scientist or Organizations: No   Attends Music therapist: Never   Marital  Status: Married  Human resources officer Violence: Not At Risk   Fear of Current or Ex-Partner: No   Emotionally Abused: No   Physically Abused: No   Sexually Abused: No    Outpatient Medications Prior to Visit  Medication Sig Dispense Refill   Accu-Chek Softclix Lancets lancets TEST BLOOD SUGAR TWICE DAILY. 200 each 11   albuterol (VENTOLIN HFA) 108 (90 Base) MCG/ACT inhaler Inhale 2 puffs into the lungs every 6 (six) hours as needed for wheezing or shortness of breath. 8 g 6   Alcohol Swabs (B-D SINGLE USE SWABS REGULAR) PADS USE  TO TEST BLOOD SUGAR TWICE DAILY 200 each 3   Ascorbic Acid (VITAMIN C) 1000 MG tablet Take 1,000 mg by mouth daily.     Cholecalciferol (VITAMIN D) 50 MCG (2000 UT) CAPS Take 1,000 Units by mouth daily.     DROPLET PEN NEEDLES 32G X 4 MM MISC 1 Device by Other route daily. 100 each 3   furosemide (LASIX) 20 MG tablet Take 1 tablet (20 mg total) by mouth daily as needed for fluid or edema. 30 tablet 1   hydrochlorothiazide (HYDRODIURIL) 25 MG tablet TAKE 1 TABLET (25 MG TOTAL) BY MOUTH DAILY. 90 tablet 3   Insulin NPH, Human,, Isophane, (NOVOLIN N FLEXPEN) 100 UNIT/ML Kiwkpen 40 units each morning, and 24 units each evening, and pen needles (65m x 32G) 1/day. 70 mL 3   levothyroxine (SYNTHROID) 50 MCG tablet TAKE 1 TABLET DAILY BEFORE BREAKFAST. 90 tablet 1   meloxicam (MOBIC) 7.5 MG tablet Take 1 tablet (7.5 mg total) by mouth daily as needed for pain.     methocarbamol (ROBAXIN) 500 MG tablet TAKE 1 TABLET EVERY 8 HOURS AS NEEDED FOR MUSCLE SPASMS. 90 tablet 3   nystatin (MYCOSTATIN/NYSTOP) powder Apply 1 application topically daily as needed (yeast under belly).     pantoprazole (PROTONIX) 40 MG tablet Take 1 tablet (40 mg total) by mouth 2 (two) times daily. 90 tablet 3   potassium chloride SA (KLOR-CON) 20 MEQ tablet TAKE 1 TABLET EVERY DAY 90 tablet 1   rosuvastatin (CRESTOR) 20 MG tablet TAKE 1 TABLET (20 MG TOTAL) BY MOUTH AT BEDTIME. 90 tablet 3   TRUE METRIX  BLOOD GLUCOSE TEST test strip      valsartan (DIOVAN) 80 MG tablet TAKE 1 TABLET EVERY DAY 90 tablet 0   No facility-administered medications prior to visit.    Allergies  Allergen Reactions   Penicillins Hives    Reaction: 1965    Review of Systems  Constitutional:  Negative for chills and fever.  Skin:  Negative for color change.      Objective:    Physical Exam Constitutional:      Appearance: Normal appearance.  HENT:  Head: Normocephalic and atraumatic.  Eyes:     Conjunctiva/sclera: Conjunctivae normal.  Skin:    General: Skin is warm and dry.     Comments: Benign appearing elevated nevus on back of neck about 6 mm  Neurological:     General: No focal deficit present.     Mental Status: She is alert. Mental status is at baseline.  Psychiatric:        Mood and Affect: Mood normal.        Behavior: Behavior normal.    BP 130/74    Pulse 98    Temp 97.8 F (36.6 C)    Resp 16    Ht _0  (1.676 m)    Wt 240 lb 1.6 oz (108.9 kg)    SpO2 95%    BMI 38.75 kg/m  Wt Readings from Last 3 Encounters:  03/30/21 240 lb 1.6 oz (108.9 kg)  03/27/21 240 lb 9.6 oz (109.1 kg)  02/13/21 240 lb (108.9 kg)    Health Maintenance Due  Topic Date Due   COVID-19 Vaccine (1) Never done   Zoster Vaccines- Shingrix (1 of 2) Never done   OPHTHALMOLOGY EXAM  01/24/2019   MAMMOGRAM  03/08/2019    There are no preventive care reminders to display for this patient.   Lab Results  Component Value Date   TSH 3.88 07/28/2020   Lab Results  Component Value Date   WBC 6.4 07/28/2020   HGB 10.7 (L) 07/28/2020   HCT 34.5 (L) 07/28/2020   MCV 85.6 07/28/2020   PLT 154 07/28/2020   Lab Results  Component Value Date   NA 138 07/28/2020   K 4.8 07/28/2020   CO2 28 07/28/2020   GLUCOSE 197 (H) 07/28/2020   BUN 15 07/28/2020   CREATININE 0.97 07/28/2020   BILITOT 0.5 07/28/2020   ALKPHOS 47 08/23/2016   AST 27 07/28/2020   ALT 20 07/28/2020   PROT 6.9 07/28/2020    ALBUMIN 4.4 08/23/2016   CALCIUM 11.0 (H) 07/28/2020   EGFR 62 07/28/2020   GFR 59.43 (L) 05/13/2020   Lab Results  Component Value Date   CHOL 181 07/28/2020   Lab Results  Component Value Date   HDL 54 07/28/2020   Lab Results  Component Value Date   LDLCALC 100 (H) 07/28/2020   Lab Results  Component Value Date   TRIG 176 (H) 07/28/2020   Lab Results  Component Value Date   CHOLHDL 3.4 07/28/2020   Lab Results  Component Value Date   HGBA1C 8.7 (A) 03/26/2021       Assessment & Plan:   1. Skin lesion of neck: Informed consent given.  Sterile prep of the area.  Area infiltrated with lidocaine with epinephrine.  Using a surgical blade, part of the upper dermis shaved off and sent  for pathology.  Area cauterized. Pt ed on scarring.     - Pathology ([facility])  Teodora Medici, DO

## 2021-04-01 LAB — PATHOLOGY REPORT

## 2021-04-01 LAB — TISSUE SPECIMEN

## 2021-04-06 ENCOUNTER — Other Ambulatory Visit: Payer: Self-pay | Admitting: Emergency Medicine

## 2021-04-06 ENCOUNTER — Other Ambulatory Visit: Payer: Self-pay | Admitting: Family Medicine

## 2021-04-28 ENCOUNTER — Telehealth: Payer: Self-pay | Admitting: Emergency Medicine

## 2021-04-28 NOTE — Telephone Encounter (Signed)
Left message for patient to call back  

## 2021-04-29 MED ORDER — PREDNISONE 10 MG PO TABS: ORAL_TABLET | ORAL | 0 refills | Status: AC

## 2021-04-29 NOTE — Telephone Encounter (Signed)
Ask her to start OTC symptomatic relief like Tylenol Cold and flu, etc. ?Have her take prednisone taper until completely gone: Take '40mg'$  daily for 3 days, then '30mg'$  daily for 3 days, then '20mg'$  daily for 3 days, then '10mg'$  daily for 3 days, then stop ?She is to call us back if not improving ?

## 2021-04-29 NOTE — Telephone Encounter (Signed)
Called and spoke with patient to let her know that Dr. Lamonte Sakai would like to send in prednisone taper and for her to take cold and flu OTC medication with the prednisone. RX has been sent. Advised to complete the prednisone even if she is feeling better and if she is not feeling better after completing to call the office to be scheduled for OV. She expressed understanding. Nothing further needed at this time.  ?

## 2021-04-29 NOTE — Telephone Encounter (Signed)
Called and spoke with patient who states that 2 days ago she started getting a sore throat, burning chest, and feels congested in her nose and chest. Productive cough with yellow sputum. Denies any fever.   ? Patient pharmacy is located at [address] [place], [place] [address]  ? ? ?Dr. Lamonte Sakai please advise ?

## 2021-04-29 NOTE — Telephone Encounter (Signed)
Called patient but she did not answer. Left message for her to call back.  

## 2021-05-01 ENCOUNTER — Telehealth: Payer: Self-pay

## 2021-05-01 ENCOUNTER — Telehealth (INDEPENDENT_AMBULATORY_CARE_PROVIDER_SITE_OTHER): Payer: Medicare HMO | Admitting: Family Medicine

## 2021-05-01 ENCOUNTER — Ambulatory Visit: Payer: Self-pay | Admitting: *Deleted

## 2021-05-01 ENCOUNTER — Encounter: Payer: Self-pay | Admitting: Family Medicine

## 2021-05-01 VITALS — Ht 66.0 in | Wt 240.0 lb

## 2021-05-01 DIAGNOSIS — J984 Other disorders of lung: Secondary | ICD-10-CM | POA: Diagnosis not present

## 2021-05-01 DIAGNOSIS — E119 Type 2 diabetes mellitus without complications: Secondary | ICD-10-CM | POA: Diagnosis not present

## 2021-05-01 DIAGNOSIS — J441 Chronic obstructive pulmonary disease with (acute) exacerbation: Secondary | ICD-10-CM

## 2021-05-01 DIAGNOSIS — J069 Acute upper respiratory infection, unspecified: Secondary | ICD-10-CM

## 2021-05-01 DIAGNOSIS — Z794 Long term (current) use of insulin: Secondary | ICD-10-CM

## 2021-05-01 MED ORDER — BENZONATATE 100 MG PO CAPS: 100.0000 mg | ORAL_CAPSULE | Freq: Three times a day (TID) | ORAL | 1 refills | Status: DC | PRN

## 2021-05-01 MED ORDER — HYDROCODONE BIT-HOMATROP MBR 5-1.5 MG/5ML PO SOLN: 5.0000 mL | Freq: Three times a day (TID) | ORAL | 0 refills | Status: DC | PRN

## 2021-05-01 NOTE — Progress Notes (Signed)
? ?Name: Tamara [name]   MRN: [id number]    DOB: 1945-12-30   Date:05/01/2021 ? ?     Progress Note ? ?Subjective:  ? ? ?Chief Complaint ? ?Chief Complaint  ?Patient presents with  ? Cough  ?  Sx started on Sunday, pt believes she had a fever on Monday. No COVID testing done. Pt was unable to give me a full set of vitals. Only WT/HT.  ? Nasal Congestion  ? ? ?I connected with  Talmadge Chad on 05/01/21 at  2:00 PM EDT by telephone and verified that I am speaking with the correct person using two identifiers. ?  ?I discussed the limitations, risks, security and privacy concerns of performing an evaluation and management service by telephone and the availability of in person appointments. Staff also discussed with the patient that there may be a patient responsible charge related to this service.  Patient verbalized understanding and agreed to proceed with encounter. ?Patient Location: home  ?Provider Location: [facility] clinic ?Additional Individuals present: none ? ?HPI ?Pt presents with URI sx, onset Saturday - 6 days ago, picked up something while she was out ?[place] cough, non-productive  ?Getting sicker every day ?Sinuses are hurting, some blood when blowing nose ?Did have hot/cold chills, can't find thermometer at home  ?Called pulm with her sx and Dr. Lamonte Sakai sent in prednisone: ?'40mg'$  daily for 3 days, then '30mg'$  daily for 3 days, then '20mg'$  daily for 3 days, then '10mg'$  daily for 3 days ?Doing OTC cold meds no improvement ?No sore throat today ?A little tight in central chest and discomfort with a deep breath ?She has not used her albuterol inhaler ? ?Chart review as described below -  ?Narrative & Impression  ?CLINICAL DATA:  Cavitating left lower lobe lung mass.  Follow-up. ?  ?EXAM: ?CT CHEST WITHOUT CONTRAST ?  ?TECHNIQUE: ?Multidetector CT imaging of the chest was performed following the ?standard protocol without IV contrast. ?  ?RADIATION DOSE REDUCTION: This exam was performed according to  the ?departmental dose-optimization program which includes automated ?exposure control, adjustment of the mA and/or kV according to ?patient size and/or use of iterative reconstruction technique. ?  ?COMPARISON:  PET-CT 06/25/2020.  Chest CT 04/19/2020 and 04/16/2020. ?  ?FINDINGS: ?Cardiovascular: Atherosclerosis of the aorta, great vessels and ?coronary arteries. Prominent calcifications of the mitral annulus. ?The heart size is normal. There is no pericardial effusion. ?  ?Mediastinum/Nodes: There are no enlarged mediastinal, hilar or ?axillary lymph nodes.Small mediastinal lymph nodes appear unchanged. ?The thyroid gland, trachea and esophagus demonstrate no significant ?findings. ?  ?Lungs/Pleura: No pleural effusion or pneumothorax. Persistent ?cavitary airspace disease medially in the left lower lobe with ?associated volume loss. Overall appearance is similar to the ?previous CT study, although there is a progressive component ?peripherally which is likely due to postobstructive pneumonitis. The ?inferior extension of this process was present to some degree on the ?interval PET-CT. No new or enlarging pulmonary nodules. Otherwise ?stable chronic lung disease with emphysema, subpleural reticulation ?and scattered ground-glass opacities. ?  ?Upper abdomen: Advanced changes of cirrhosis are again noted ?throughout the liver. Hypodense lesion in the left lobe measuring ?1.8 cm on image 131/2 is stable from the previous CT, and showed no ?metabolic activity on prior PET-CT, likely a cyst. This is ?incompletely characterized without contrast. There is a stable 1.5 ?cm low-density right adrenal nodule consistent with an adenoma. ?Aortic and branch vessel atherosclerosis noted. ?  ?Musculoskeletal/Chest wall: No acute osseous findings or suspicious ?lesions. Multilevel  spondylosis. ?  ?IMPRESSION: ?1. 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 Findings again favor a chronic ?infectious/inflammatory process, potentially chronic aspiration. ?2. Additional chronic lung disease appears unchanged. No suspicious ?pulmonary nodules. ?3. Advanced changes of hepatic cirrhosis. Stable small right adrenal ?adenoma. ?4. Coronary and Aortic Atherosclerosis (ICD10-I70.0). ?  ?  ?Electronically Signed ?  By: Richardean Sale M.D. ?  On: 02/06/2021 16:33  ? ? ?Per last pulm note 02/13/2021 A&P: ? 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  Overall stable compared with prior.  I do believe we need to continue to keep close surveillance.  We will plan a repeat CT in 6 months.  She will call me if she has any symptoms of a recurrent pneumonia or bronchitis.  We did treat her with doxycycline in mid December. ?  ?COPD (chronic obstructive pulmonary disease) ([place]) ?Briefly reviewed.  Plan to continue her same regimen. ?  ?Chronic cough ?Continue same regimen ? ?Reviewed Pulm Hx: ?ROV 09/17/20 --76 year old former smoker who follows up today for her chronic cough, probable COPD and abnormal CT scan of the chest.  We evaluated a medial left lower lobe opacity by bronchoscopy in May, no definitive diagnosis of malignancy.  I treated her with antibiotics and a subsequent PET scan 06/25/2020 showed no significant change in the masslike opacity and only mild hypermetabolism, question resolving pneumonia.  I treated her with another round of Levaquin in June because she had persistent sputum and cough.  Also changed her ACE inhibitor to valsartan.  She has been on Trelegy but stopped it months ago, PFT have not been done yet. ?Today she reports that her cough is unchanged > she has cough in the am when she wakes up, clear to yellow mucous. SOB with chores and housework.  ?On protonix qd, remains on fish oil. ACE-I to valsartan ?   ?ROV 11/20/20 --follow-up visit 76 year old former smoker with a history of suspected COPD, chronic cough.  We had been following a medial left lower lobe opacity that was negative for malignancy on bronchoscopy 05/2020.  Subsequent PET scan did not show hypermetabolism, question evolving rounded atelectasis post pneumonia.  She has continued to deal with chronic cough, changed her ACE inhibitor to valsartan.  Remains on pantoprazole.  I increase the pantoprazole to twice daily, stopped her fish oil last time.  Also started [employer] as she had not tolerated Trelegy in the past. She thinks she got spiriva instead of stiolto,  today she reports that her cough was worse on the new med. Overall her cough is a bit better. She is using albuterol about once a day.  ?  ?ROV 02/13/21 --76 year old woman, former tobacco with suspected COPD and history of chronic cough.  She has a medial left lower lobe opacity, negative on PET scan for hypermetabolism, negative on bronchoscopy 05/2020, question rounded atelectasis. ?Overall stable. She does cough up some thick yellow especially in the morning. She was treated with doxy in mid Dec 2022.  ?  ?CT chest 02/06/2021 reviewed by me shows a persistent cavitary airspace disease in the left lower lobe with associated volume loss, overall similar appearance to 06/25/2020 and 05/09/2020 although there is some progressive peripheral airspace disease that looks consistent with postobstructive pneumonitis ? ? ? ?Patient Active Problem List  ? Diagnosis Date Noted  ?  Diabetes ([place]) 11/29/2020  ? 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 Chronic cough 05/13/2020  ? COPD (chronic obstructive pulmonary disease) ([place]) 05/13/2020  ? 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 Anemia of chronic disease 09/05/2017  ? Colon polyps 09/05/2017  ? Hypertension 07/14/2016  ? Hyperlipemia 07/14/2016  ? Bilateral lower extremity edema 07/14/2016  ?  GERD (gastroesophageal reflux disease) 07/14/2016  ? Screening for colorectal cancer 07/14/2016  ? ? ?Social History  ? ?Tobacco Use  ? Smoking status: Former  ?  Packs/day: 1.00  ?  Years: 40.00  ?  Pack years: 40.00

## 2021-05-01 NOTE — Patient Instructions (Signed)
I suspect you have a viral upper respiratory infection causing a COPD exacerbation ? ?Plan:  ? ?continue steroid taper from pulmonology  ?Use inhaler for coughing, chest tightness, wheeze, or shortness of breath symptoms - if any severe chest tightness of shortness of breath and it is not improved by three repeated inhaler uses within one hour - go to the ER or call 911 for immediate evaluation ?Add mucinex over the counter daily and push ample fluids ?Cough syrup (controlled) for bedtime only  ?Use other cough meds during the day - can try delsym, robitussin, or try the tessalon perles ? ?Follow up here or at pulmonary if any worsening in the next week ? ?

## 2021-05-01 NOTE — Progress Notes (Signed)
? ? ?Chronic Care Management ?Pharmacy Assistant  ? ?Name: Tamara [name]  MRN: [id number] DOB: 1945/07/04 ? ?Reason for Encounter: Diabetes Disease State Call ?  ?Recent office visits:  ?03/30/2021 Teodora Medici, DO (PCP Office Visit) for Mole Removal-No medication changes noted, Lab order placed,  ? ?01/29/2021 Teodora Medici, DO (PCP Office Visit) for HTN- No medication changes noted, Mammogram ordered, Patient to follow-up in 6 months ? ?Recent consult visits:  ?04/28/2021 02/13/2021 Baltazar Apo, MD (Pulmonology Telephone Sick Call)- Started: Prednisone Taper, Take OTC cold and flu medication ? ?03/26/2021 Renato Shin, MD (Endocrinology) for Uncontrolled Type II DM- Stopped: Metformin 1000 mg, Patient instructed to increase insulin to 46 units in the AM and 24 units in the PM, Lab order placed, patient to follow-up in 4 months ? ?02/13/2021 Baltazar Apo, MD (Pulmonology) for Follow-up- No medication changes noted, CT Super D Chest w/o Contrast ordered, Patient to follow-up in 6 months ? ?Hospital visits:  ?None in previous 6 months ? ?Medications: ?Outpatient Encounter Medications as of 05/01/2021  ?Medication Sig  ? Accu-Chek Softclix Lancets lancets TEST BLOOD SUGAR TWICE DAILY.  ? albuterol (VENTOLIN HFA) 108 (90 Base) MCG/ACT inhaler Inhale 2 puffs into the lungs every 6 (six) hours as needed for wheezing or shortness of breath.  ? Alcohol Swabs (B-D SINGLE USE SWABS REGULAR) PADS USE  TO TEST BLOOD SUGAR TWICE DAILY  ? Ascorbic Acid (VITAMIN C) 1000 MG tablet Take 1,000 mg by mouth daily.  ? Cholecalciferol (VITAMIN D) 50 MCG (2000 UT) CAPS Take 1,000 Units by mouth daily.  ? DROPLET PEN NEEDLES 32G X 4 MM MISC 1 Device by Other route daily.  ? furosemide (LASIX) 20 MG tablet Take 1 tablet (20 mg total) by mouth daily as needed for fluid or edema.  ? hydrochlorothiazide (HYDRODIURIL) 25 MG tablet TAKE 1 TABLET (25 MG TOTAL) BY MOUTH DAILY.  ? Insulin NPH, Human,, Isophane, (NOVOLIN N FLEXPEN) 100  UNIT/ML Kiwkpen 40 units each morning, and 24 units each evening, and pen needles (25m x 32G) 1/day.  ? levothyroxine (SYNTHROID) 50 MCG tablet TAKE 1 TABLET DAILY BEFORE BREAKFAST.  ? meloxicam (MOBIC) 7.5 MG tablet Take 1 tablet (7.5 mg total) by mouth daily as needed for pain.  ? methocarbamol (ROBAXIN) 500 MG tablet TAKE 1 TABLET EVERY 8 HOURS AS NEEDED FOR MUSCLE SPASMS.  ? nystatin (MYCOSTATIN/NYSTOP) powder Apply 1 application topically daily as needed (yeast under belly).  ? pantoprazole (PROTONIX) 40 MG tablet TAKE 1 TABLET TWICE DAILY  ? potassium chloride SA (KLOR-CON M) 20 MEQ tablet TAKE 1 TABLET EVERY DAY  ? predniSONE (DELTASONE) 10 MG tablet Take 4 tablets (40 mg total) by mouth daily with breakfast for 3 days, THEN 3 tablets (30 mg total) daily with breakfast for 3 days, THEN 2 tablets (20 mg total) daily with breakfast for 3 days, THEN 1 tablet (10 mg total) daily with breakfast for 3 days.  ? rosuvastatin (CRESTOR) 20 MG tablet TAKE 1 TABLET (20 MG TOTAL) BY MOUTH AT BEDTIME.  ? TRUE METRIX BLOOD GLUCOSE TEST test strip   ? valsartan (DIOVAN) 80 MG tablet TAKE 1 TABLET EVERY DAY  ? ?No facility-administered encounter medications on file as of 05/01/2021.  ? ?Care Gaps: ?COVID-19 Vaccine ?Zoster Vaccine ?Diabetic Eye Exam ?Mammogram ? ?Star Rating Drugs: ?Valsartan 80 mg last filled on 03/13/2021 [facility] a [facility]-Day supply [facility]?Rosuvastatin 20 mg last filled on 03/03/2021 for a 90-Day supply with [facility]? ?Recent Relevant Labs: ?Lab  Results  ?Component Value Date/Time  ? HGBA1C 8.7 (A) 03/26/2021 01:16 PM  ? HGBA1C 7.3 (A) 11/26/2020 01:20 PM  ? HGBA1C 10.8 (H) 03/22/2019 10:20 AM  ? HGBA1C 8.2 (H) 11/22/2018 12:00 AM  ? MICROALBUR 0.5 09/05/2017 08:17 AM  ? MICROALBUR 0.2 08/23/2016 08:25 AM  ?  ?Kidney Function ?Lab Results  ?Component Value Date/Time  ? CREATININE 0.97 07/28/2020 10:26 AM  ? CREATININE 0.95 05/13/2020 02:38 PM  ? CREATININE 0.91 11/15/2019 03:23 PM  ?  GFR 59.43 (L) 05/13/2020 02:38 PM  ? GFRNONAA 63 11/15/2019 03:23 PM  ? GFRAA 73 11/15/2019 03:23 PM  ? ?Current antihyperglycemic regimen:  ?NPH 46 units AM, 25 units nightly ? ?What recent interventions/DTPs have been made to improve glycemic control:  ?Metformin discontinued. Insulin units  increased ? ?Have there been any recent hospitalizations or ED visits since last visit with CPP? No ? ?Patient denies hypoglycemic symptoms, including Pale, Sweaty, Shaky, Hungry, Nervous/irritable, and Vision changes ?Patient denies hyperglycemic symptoms, including blurry vision, excessive thirst, fatigue, polyuria, and weakness ?How often are you checking your blood sugar? once daily ?What are your blood sugars ranging?  ?Fasting: 160 ? ?During the week, how often does your blood glucose drop below 70? Never ?Are you checking your feet daily/regularly? Yes ? ?Adherence Review: ?Is the patient currently on a STATIN medication? Yes ?Is the patient currently on ACE/ARB medication? Yes ?Does the patient have >5 day gap between last estimated fill dates? No ? ?Patient reports she is not feeling well. Patient reports dry cough with no production of mucus. Patient denies any fever, no increased shortness of breath. Patient reports she was started on a Prednisone Taper, but her cough is getting worse. Patient reports her cough is worse at night, and she is not sleeping. Patient is taking OTC Tylenol cold and flu that's not helping.  ? ?Patient stated her blood sugars have been a little elevated since starting the Prednisone, but nothing extremely high. Patient informed that I would contact her PCP's office with the information and have a nurse contact her with further Triage.  ? ?Patient agreeable and has no other concerns or issues at this time. ? ?Patient has a telephone appointment with Junius Argyle, CPP on 07/22/2021 '@1500'$  ? ?Lynann Bologna, CPA/CMA ?Clinical Pharmacist Assistant ?Phone: [phone]  ? ? ? ?

## 2021-05-01 NOTE — Telephone Encounter (Signed)
Summary: sore throat/cough  ? Philis Nettle the assistant to [facility] the pharmacist would like to have a nurse call the pt.  Philis Nettle just spoke w/ pt and pt has really bad cough and sore throat. She asked that nurse call her to be triaged.   ?  ? ?Reason for Disposition ? [1] Continuous (nonstop) coughing interferes with work or school AND [2] no improvement using cough treatment per Care Advice ? ?Answer Assessment - Initial Assessment Questions ?1. ONSET: "When did the cough begin?"  ?    Last Saturday ?2. SEVERITY: "How bad is the cough today?"  ?    Keeping patient up at night ?3. SPUTUM: "Describe the color of your sputum" (none, dry cough; clear, white, yellow, green) ?    Very little ?4. HEMOPTYSIS: "Are you coughing up any blood?" If so ask: "How much?" (flecks, streaks, tablespoons, etc.) ?    no ?5. DIFFICULTY BREATHING: "Are you having difficulty breathing?" If Yes, ask: "How bad is it?" (e.g., mild, moderate, severe)  ?  - MILD: No SOB at rest, mild SOB with walking, speaks normally in sentences, can lie down, no retractions, pulse < 100.  ?  - MODERATE: SOB at rest, SOB with minimal exertion and prefers to sit, cannot lie down flat, speaks in phrases, mild retractions, audible wheezing, pulse 100-120.  ?  - SEVERE: Very SOB at rest, speaks in single words, struggling to breathe, sitting hunched forward, retractions, pulse > 120  ?    No problems ?6. FEVER: "Do you have a fever?" If Yes, ask: "What is your temperature, how was it measured, and when did it start?" ?    no ?7. CARDIAC HISTORY: "Do you have any history of heart disease?" (e.g., heart attack, congestive heart failure)  ?    no ?8. LUNG HISTORY: "Do you have any history of lung disease?"  (e.g., pulmonary embolus, asthma, emphysema) ?    COPD ?9. PE RISK FACTORS: "Do you have a history of blood clots?" (or: recent major surgery, recent prolonged travel, bedridden) ?    no ?10. OTHER SYMPTOMS: "Do you have any other symptoms?" (e.g., runny nose, wheezing,  chest pain) ?      Congestion, pain with sinus ?11. PREGNANCY: "Is there any chance you are pregnant?" "When was your last menstrual period?" ?        ?12. TRAVEL: "Have you traveled out of the country in the last month?" (e.g., travel history, exposures) ? ?Protocols used: Cough - Acute Non-Productive-A-AH ? ?

## 2021-05-01 NOTE — Telephone Encounter (Signed)
?  Chief Complaint: cough ?Symptoms: cough, congestion ?Frequency: almost 6 days ?Pertinent Negatives: Patient denies SOB ?Disposition: '[]'$ ED /'[]'$ Urgent Care (no appt availability in office) / '[x]'$ Appointment(In office/virtual)/ '[]'$  [place] [facility]/ '[]'$ Home Care/ '[]'$ Refused Recommended Disposition /'[]'$ [place] [facility]/ '[]'$  Follow-up with PCP ?Additional Notes:    ?

## 2021-05-12 ENCOUNTER — Telehealth: Payer: Self-pay | Admitting: Emergency Medicine

## 2021-05-12 NOTE — Telephone Encounter (Signed)
Spoke with pt and scheduled for OV with NP Tammy Parrett on Thursday 05/14/21. Instructed pt if symptoms became worse to go to UC or ED for further evaluation. Pt stated understanding. Nothing further needed at this time.  ?

## 2021-05-12 NOTE — Telephone Encounter (Signed)
Spoke with pt who states that she has finished most recent pred taper ordered on 04/28/21. Pt the prednisone did nothing to improve her SOB and cough. Pt stated cough has gotten worse since being given taper. Pt states she feels like needs antibiotic. Pt denies fever/ chills/ GI upset. Pt states taking all medications as directed. PT advised if symptoms  get worse she should go to UC or ED. Sarah please advise as Dr. Lamonte Sakai is currently unavailable.   ?

## 2021-05-14 ENCOUNTER — Ambulatory Visit (INDEPENDENT_AMBULATORY_CARE_PROVIDER_SITE_OTHER): Payer: Medicare HMO

## 2021-05-14 ENCOUNTER — Ambulatory Visit: Payer: Medicare HMO | Admitting: Adult Health

## 2021-05-14 ENCOUNTER — Encounter: Payer: Self-pay | Admitting: Adult Health

## 2021-05-14 VITALS — BP 120/70 | HR 98 | Temp 98.1°F | Ht 66.0 in | Wt 235.2 lb

## 2021-05-14 DIAGNOSIS — J449 Chronic obstructive pulmonary disease, unspecified: Secondary | ICD-10-CM

## 2021-05-14 DIAGNOSIS — J984 Other disorders of lung: Secondary | ICD-10-CM | POA: Diagnosis not present

## 2021-05-14 DIAGNOSIS — J441 Chronic obstructive pulmonary disease with (acute) exacerbation: Secondary | ICD-10-CM | POA: Diagnosis not present

## 2021-05-14 MED ORDER — LEVOFLOXACIN 500 MG PO TABS: 500.0000 mg | ORAL_TABLET | Freq: Every day | ORAL | 0 refills | Status: AC

## 2021-05-14 MED ORDER — STIOLTO RESPIMAT 2.5-2.5 MCG/ACT IN AERS: 2.0000 | INHALATION_SPRAY | Freq: Every day | RESPIRATORY_TRACT | 0 refills | Status: DC

## 2021-05-14 MED ORDER — TIOTROPIUM BROMIDE-OLODATEROL 2.5-2.5 MCG/ACT IN AERS: 2.0000 | INHALATION_SPRAY | Freq: Every day | RESPIRATORY_TRACT | 6 refills | Status: DC

## 2021-05-14 NOTE — Assessment & Plan Note (Signed)
Acute COPD exacerbation.  Patient will begin empiric antibiotics with Levaquin.  Check chest x-ray today. ?Begin Stiolto inhaler daily. ?Close follow-up.  Patient is advised if symptoms not improving she is to contact us for sooner follow-up. ? ?Plan  ?Patient Instructions  ?Levaquin '500mg'$  daily for 7 days .  ?Mucinex DM Twice daily  As needed   ?Chest xray today .  ?Albuterol inhaler As needed   ?Begin Stiolto 2 puffs daily  ?Follow up in 1-2 weeks with Dr. Lamonte Sakai  or Conleigh Heinlein NP and As needed   ?Please contact office for sooner follow up if symptoms do not improve or worsen or seek emergency care  ? ? ?  ? ?

## 2021-05-14 NOTE — Assessment & Plan Note (Signed)
Chest xray today.

## 2021-05-14 NOTE — Patient Instructions (Addendum)
Levaquin '500mg'$  daily for 7 days .  ?Mucinex DM Twice daily  As needed   ?Chest xray today .  ?Albuterol inhaler As needed   ?Begin Stiolto 2 puffs daily  ?Follow up in 1-2 weeks with Dr. Lamonte Sakai  or Rosalea Withrow NP and As needed   ?Please contact office for sooner follow up if symptoms do not improve or worsen or seek emergency care  ? ? ?

## 2021-05-14 NOTE — Progress Notes (Signed)
? ?'@Patient'$  ID: Tamara [name], female    DOB: 05/23/1945, 76 y.o.   MRN: [id number] ? ?Chief Complaint  ?Patient presents with  ? Acute Visit  ? ? ?Referring provider: ?[name], PA-C ? ?HPI: ?76 year old female former smoker followed for COPD, left lower lobe lung mass (work-up bronchoscopy with negative malignant cells, PET scan negative for hypermetabolism) ? ?TEST/EVENTS :  ?CT chest 02/06/2021 reviewed by me shows a persistent cavitary airspace disease in the left lower lobe with associated volume loss, overall similar appearance to 06/25/2020 and 05/09/2020 although there is some progressive peripheral airspace disease that looks consistent with postobstructive pneumonitis ? ?05/14/2021 Acute OV : COPD  ?Patient complains over the last 3 weeks that she has had increased cough, congestion, thick brown-yellow mucus.  Has a lot of nasal congestion and postnasal drainage.  Patient says she has no other family members that are sick.  Symptoms have been ongoing for the last 3 weeks.  She has been using some Mucinex without much relief.  She complains of some intermittent wheezing.  She remains on albuterol inhaler only.  Says that she has tried Trelegy in the past but could not afford this. ?Patient denies any hemoptysis weight loss or fever. ?She has not tested for COVID-19. ?Patient was called in prednisone on February 28, 2021.  Patient says it did not make much difference in her symptoms.  She also has diabetes and this did make her blood sugars elevated ? ? ? ?Allergies  ?Allergen Reactions  ? Penicillins Hives  ?  Reaction: 1965  ? ? ?Immunization History  ?Administered Date(s) Administered  ? Fluad Quad(high Dose 65+) 09/20/2018, 10/16/2019, 10/16/2020  ? Influenza, High Dose Seasonal PF 10/16/2019  ? Influenza,inj,Quad PF,6+ Mos 10/18/2016, 09/29/2017  ? Pneumococcal Conjugate-13 10/16/2019  ? Pneumococcal Polysaccharide-23 03/26/2013  ? ? ?Past Medical History:  ?Diagnosis Date  ? Arthritis   ? knee and  shoulders  ? Diabetes mellitus without complication ([place])   ? type II  ? Dyspnea   ? 05/16/20 has had a cough for 2.5 years post Covid  ? Gastritis 09/05/2017  ? GERD (gastroesophageal reflux disease)   ? Hyperlipidemia   ? Hypertension   ? patient denies  ? Hypothyroidism   ? Panic attack   ? RUQ pain 03/09/2017  ? Per patient, she has had RUQ pain 4-5 years that has grown worse in the last few months.  ? ? ?Tobacco History: ?Social History  ? ?Tobacco Use  ?Smoking Status Former  ? Packs/day: 1.00  ? Years: 40.00  ? Pack years: 40.00  ? Types: Cigarettes  ? Quit date: 2006  ? Years since quitting: 17.3  ?Smokeless Tobacco Never  ?Tobacco Comments  ? Smoking cessation materials not required  ? ?Counseling given: Not Answered ?Tobacco comments: Smoking cessation materials not required ? ? ?Outpatient Medications Prior to Visit  ?Medication Sig Dispense Refill  ? Accu-Chek Softclix Lancets lancets TEST BLOOD SUGAR TWICE DAILY. 200 each 11  ? albuterol (VENTOLIN HFA) 108 (90 Base) MCG/ACT inhaler Inhale 2 puffs into the lungs every 6 (six) hours as needed for wheezing or shortness of breath. 8 g 6  ? Alcohol Swabs (B-D SINGLE USE SWABS REGULAR) PADS USE  TO TEST BLOOD SUGAR TWICE DAILY 200 each 3  ? Ascorbic Acid (VITAMIN C) 1000 MG tablet Take 1,000 mg by mouth daily.    ? benzonatate (TESSALON) 100 MG capsule Take 1-2 capsules (100-200 mg total) by mouth 3 (three) times daily as needed  for cough. 30 capsule 1  ? Cholecalciferol (VITAMIN D) 50 MCG (2000 UT) CAPS Take 1,000 Units by mouth daily.    ? DROPLET PEN NEEDLES 32G X 4 MM MISC 1 Device by Other route daily. 100 each 3  ? furosemide (LASIX) 20 MG tablet Take 1 tablet (20 mg total) by mouth daily as needed for fluid or edema. 30 tablet 1  ? hydrochlorothiazide (HYDRODIURIL) 25 MG tablet TAKE 1 TABLET (25 MG TOTAL) BY MOUTH DAILY. 90 tablet 3  ? Insulin NPH, Human,, Isophane, (NOVOLIN N FLEXPEN) 100 UNIT/ML Kiwkpen 40 units each morning, and 24 units each evening,  and pen needles (69m x 32G) 1/day. 70 mL 3  ? levothyroxine (SYNTHROID) 50 MCG tablet TAKE 1 TABLET DAILY BEFORE BREAKFAST. 90 tablet 1  ? meloxicam (MOBIC) 7.5 MG tablet Take 1 tablet (7.5 mg total) by mouth daily as needed for pain.    ? methocarbamol (ROBAXIN) 500 MG tablet TAKE 1 TABLET EVERY 8 HOURS AS NEEDED FOR MUSCLE SPASMS. 90 tablet 3  ? nystatin (MYCOSTATIN/NYSTOP) powder Apply 1 application topically daily as needed (yeast under belly).    ? pantoprazole (PROTONIX) 40 MG tablet TAKE 1 TABLET TWICE DAILY 180 tablet 1  ? potassium chloride SA (KLOR-CON M) 20 MEQ tablet TAKE 1 TABLET EVERY DAY 90 tablet 0  ? rosuvastatin (CRESTOR) 20 MG tablet TAKE 1 TABLET (20 MG TOTAL) BY MOUTH AT BEDTIME. 90 tablet 3  ? TRUE METRIX BLOOD GLUCOSE TEST test strip     ? valsartan (DIOVAN) 80 MG tablet TAKE 1 TABLET EVERY DAY 90 tablet 0  ? HYDROcodone bit-homatropine (HYCODAN) 5-1.5 MG/5ML syrup Take 5 mLs by mouth 3 (three) times daily as needed for cough (try to use only at nighttime cough). (Patient not taking: Reported on 05/14/2021) 120 mL 0  ? ?No facility-administered medications prior to visit.  ? ? ? ?Review of Systems:  ? ?Constitutional:   No  weight loss, night sweats,  Fevers, chills,  ?+fatigue, or  lassitude. ? ?HEENT:   No headaches,  Difficulty swallowing,  Tooth/dental problems, or  Sore throat,  ?              No sneezing, itching, ear ache,  ?+nasal congestion, post nasal drip,  ? ?CV:  No chest pain,  Orthopnea, PND, swelling in lower extremities, anasarca, dizziness, palpitations, syncope.  ? ?GI  No heartburn, indigestion, abdominal pain, nausea, vomiting, diarrhea, change in bowel habits, loss of appetite, bloody stools.  ? ?Resp:  No chest wall deformity ? ?Skin: no rash or lesions. ? ?GU: no dysuria, change in color of urine, no urgency or frequency.  No flank pain, no hematuria  ? ?MS:  No joint pain or swelling.  No decreased range of motion.  No back pain. ? ? ? ?Physical Exam ? ?BP 120/70 (BP  Location: Left Arm, Patient Position: Sitting, Cuff Size: Large)   Pulse 98   Temp 98.1 ?F (36.7 ?C) (Oral)   Ht '5\' 6"'$  (1.676 m)   Wt 235 lb 3.2 oz (106.7 kg)   SpO2 93%   BMI 37.96 kg/m?  ? ?GEN: A/Ox3; pleasant , NAD, well nourished  ?  ?HEENT:  [place]/AT,  EACs-clear, TMs-wnl, NOSE-clear, THROAT-clear, no lesions, no postnasal drip or exudate noted.  ? ?NECK:  Supple w/ fair ROM; no JVD; normal carotid impulses w/o bruits; no thyromegaly or nodules palpated; no lymphadenopathy.   ? ?RESP scattered rhonchi bilaterally  no accessory muscle use, no dullness to percussion ? ?  CARD:  RRR, no m/r/g, no peripheral edema, pulses intact, no cyanosis or clubbing. ? ?GI:   Soft & nt; nml bowel sounds; no organomegaly or masses detected.  ? ?Musco: Warm bil, no deformities or joint swelling noted.  ? ?Neuro: alert, no focal deficits noted.   ? ?Skin: Warm, no lesions or rashes ? ? ? ?Lab Results: ? ? ? ? ?ProBNP ?No results found for: PROBNP ? ?Imaging: ?DG Chest 2 View ? ?Result Date: 05/14/2021 ?CLINICAL DATA:  COPD exacerbation. EXAM: CHEST - 2 VIEW COMPARISON:  CT chest 02/06/2021, AP chest 05/19/2020, chest two views 11/15/2019 FINDINGS: Cardiac silhouette and mediastinal contours are within normal limits with mild calcification within aortic arch. There is mild-to-moderate interstitial thickening which appears chronic. No definite focal airspace opacity to indicate pneumonia. No pleural effusion or pneumothorax. Moderate multilevel degenerative disc changes of the thoracic spine. IMPRESSION: Chronic bilateral interstitial thickening favored to represent chronic scarring. No definite pneumonia. There appears to be resolution of the left posterior lower lobe pneumonia seen on prior 02/06/2021 CT. Electronically Signed   By: Yvonne Kendall M.D.   On: 05/14/2021 12:07   ? ? ? ?   ? View : No data to display.  ?  ?  ?  ? ? ?No results found for: NITRICOXIDE ? ? ? ? ? ?Assessment & Plan:  ? ?COPD (chronic obstructive  pulmonary disease) ([place]) ?Acute COPD exacerbation.  Patient will begin empiric antibiotics with Levaquin.  Check chest x-ray today. ?Begin Stiolto inhaler daily. ?Close follow-up.  Patient is advised if symptoms not

## 2021-05-18 NOTE — Progress Notes (Signed)
Called and spoke with patient, advised of results/recommendations per Rexene Edison NP.  She verbalized understanding.  She has a f/u with Tammy on Friday of this week.  Nothing further needed.

## 2021-05-22 ENCOUNTER — Ambulatory Visit: Payer: Medicare HMO | Admitting: Adult Health

## 2021-05-22 ENCOUNTER — Encounter: Payer: Self-pay | Admitting: Adult Health

## 2021-05-22 DIAGNOSIS — R053 Chronic cough: Secondary | ICD-10-CM

## 2021-05-22 DIAGNOSIS — J984 Other disorders of lung: Secondary | ICD-10-CM | POA: Diagnosis not present

## 2021-05-22 DIAGNOSIS — E119 Type 2 diabetes mellitus without complications: Secondary | ICD-10-CM

## 2021-05-22 DIAGNOSIS — J441 Chronic obstructive pulmonary disease with (acute) exacerbation: Secondary | ICD-10-CM | POA: Diagnosis not present

## 2021-05-22 DIAGNOSIS — Z794 Long term (current) use of insulin: Secondary | ICD-10-CM

## 2021-05-22 MED ORDER — ALBUTEROL SULFATE (2.5 MG/3ML) 0.083% IN NEBU: 2.5000 mg | INHALATION_SOLUTION | Freq: Four times a day (QID) | RESPIRATORY_TRACT | 5 refills | Status: DC | PRN

## 2021-05-22 MED ORDER — ALBUTEROL SULFATE (2.5 MG/3ML) 0.083% IN NEBU
2.5000 mg | INHALATION_SOLUTION | Freq: Once | RESPIRATORY_TRACT | Status: AC
  Administered 2021-05-22: 2.5 mg via RESPIRATORY_TRACT

## 2021-05-22 MED ORDER — LEVOFLOXACIN 500 MG PO TABS: 500.0000 mg | ORAL_TABLET | Freq: Every day | ORAL | 0 refills | Status: AC

## 2021-05-22 MED ORDER — PREDNISONE 20 MG PO TABS: 20.0000 mg | ORAL_TABLET | Freq: Every day | ORAL | 0 refills | Status: DC

## 2021-05-22 MED ORDER — BENZONATATE 200 MG PO CAPS: 200.0000 mg | ORAL_CAPSULE | Freq: Three times a day (TID) | ORAL | 1 refills | Status: DC | PRN

## 2021-05-22 NOTE — Assessment & Plan Note (Signed)
Continue with cough control regimen.  Tessalon and Mucinex DM.  Short course of steroids. ?

## 2021-05-22 NOTE — Assessment & Plan Note (Signed)
Patient has insulin-dependent diabetes.  Of advised her to keep a close eye on her blood sugars.  Call if they are greater than 250.  We are adding low-dose steroids for 1 week.  Patient has follow-up with endocrinology next week. ?

## 2021-05-22 NOTE — Assessment & Plan Note (Signed)
Slow to resolve COPD exacerbation-we will extend antibiotics for additional 3 days.  Add in low-dose steroids.  Need to be cautious as patient is insulin-dependent diabetic.  Have advised her to call if her blood sugars are greater than 250 will need adjustment of her insulin.  She does have a follow-up with her endocrinologist next week. ?We will add in albuterol nebulizer as needed.  Continue with cough control with Mucinex DM and Tessalon ? ?Plan ?Patient Instructions  ?Extend Levaquin '500mg'$  daily for 3 days  ?Mucinex DM Twice daily  As needed   ?Tessalon Three times a day  As needed  cough.  ?Saline nasal spray Twice daily   ?Saline nasal gel At bedtime   ?Albuterol inhaler As needed   ?Add albuterol neb every 6hr as needed (take albuterol inhaler or neb )  ?Continue on Spiriva 2 puffs daily  ?Call if BS >250 as Insulin may need to be adjusted.  ?Follow up in 2 weeks with Dr. Lamonte Sakai  or Jearline Hirschhorn NP and As needed   ?Please contact office for sooner follow up if symptoms do not improve or worsen or seek emergency care  ? ? ?  ? ?

## 2021-05-22 NOTE — Progress Notes (Signed)
? ?'@Patient'$  ID: Tamara [name], female    DOB: 1945-12-18, 76 y.o.   MRN: [id number] ? ?Chief Complaint  ?Patient presents with  ? Follow-up  ? ? ?Referring provider: ?[name], PA-C ? ?HPI: ?76 year old female former smoker followed for COPD, left lung mass (work-up bronchoscopy with negative malignant cells, PET scan negative for hypermetabolic activity) ? ?TEST/EVENTS :  ?CT chest 02/06/2021 reviewed by me shows a persistent cavitary airspace disease in the left lower lobe with associated volume loss, overall similar appearance to 06/25/2020 and 05/09/2020 although there is some progressive peripheral airspace disease that looks consistent with postobstructive pneumonitis ? ?Chest x-ray May 14, 2021 showed chronic bilateral interstitial thickening representative of chronic scarring, resolution of left lower lobe pneumonia ? ? ?05/22/2021 Follow up : COPD exacerbation  ?Patient presents for 1 week follow-up.  Patient was seen last visit with a slow to resolve COPD exacerbation.  She complained of 3 weeks of productive cough with thick yellow-brown mucus.  Along with wheezing.  Took prednisone taper initially with no significant relief . Patient was given Levaquin 500 mg for 7 days , finished it yesterday . Chest x-ray showed no acute process.  She was started on Stiolto inhaler.  Insurance would not cover.  She is now on Spiriva. ?Since last visit patient is not feeling much better.  Continues to have ongoing cough that is disrupting her day and interrupting her sleep..  Patient is eating well.  No nausea vomiting diarrhea.  Patient does have insulin-dependent diabetes. ?She denies any hemoptysis chest pain orthopnea PND or increased leg swelling. ?O2 saturations today in the office are 96%.  On room air ? ?Allergies  ?Allergen Reactions  ? Penicillins Hives  ?  Reaction: 1965  ? ? ?Immunization History  ?Administered Date(s) Administered  ? Fluad Quad(high Dose 65+) 09/20/2018, 10/16/2019, 10/16/2020  ?  Influenza, High Dose Seasonal PF 10/16/2019  ? Influenza,inj,Quad PF,6+ Mos 10/18/2016, 09/29/2017  ? Pneumococcal Conjugate-13 10/16/2019  ? Pneumococcal Polysaccharide-23 03/26/2013  ? ? ?Past Medical History:  ?Diagnosis Date  ? Arthritis   ? knee and shoulders  ? Diabetes mellitus without complication ([place])   ? type II  ? Dyspnea   ? 05/16/20 has had a cough for 2.5 years post Covid  ? Gastritis 09/05/2017  ? GERD (gastroesophageal reflux disease)   ? Hyperlipidemia   ? Hypertension   ? patient denies  ? Hypothyroidism   ? Panic attack   ? RUQ pain 03/09/2017  ? Per patient, she has had RUQ pain 4-5 years that has grown worse in the last few months.  ? ? ?Tobacco History: ?Social History  ? ?Tobacco Use  ?Smoking Status Former  ? Packs/day: 1.00  ? Years: 40.00  ? Pack years: 40.00  ? Types: Cigarettes  ? Quit date: 2006  ? Years since quitting: 17.3  ?Smokeless Tobacco Never  ?Tobacco Comments  ? Smoking cessation materials not required  ? ?Counseling given: Not Answered ?Tobacco comments: Smoking cessation materials not required ? ? ?Outpatient Medications Prior to Visit  ?Medication Sig Dispense Refill  ? Accu-Chek Softclix Lancets lancets TEST BLOOD SUGAR TWICE DAILY. 200 each 11  ? albuterol (VENTOLIN HFA) 108 (90 Base) MCG/ACT inhaler Inhale 2 puffs into the lungs every 6 (six) hours as needed for wheezing or shortness of breath. 8 g 6  ? Alcohol Swabs (B-D SINGLE USE SWABS REGULAR) PADS USE  TO TEST BLOOD SUGAR TWICE DAILY 200 each 3  ? Ascorbic Acid (VITAMIN C) 1000  MG tablet Take 1,000 mg by mouth daily.    ? Cholecalciferol (VITAMIN D) 50 MCG (2000 UT) CAPS Take 1,000 Units by mouth daily.    ? DROPLET PEN NEEDLES 32G X 4 MM MISC 1 Device by Other route daily. 100 each 3  ? furosemide (LASIX) 20 MG tablet Take 1 tablet (20 mg total) by mouth daily as needed for fluid or edema. 30 tablet 1  ? hydrochlorothiazide (HYDRODIURIL) 25 MG tablet TAKE 1 TABLET (25 MG TOTAL) BY MOUTH DAILY. 90 tablet 3  ? Insulin  NPH, Human,, Isophane, (NOVOLIN N FLEXPEN) 100 UNIT/ML Kiwkpen 40 units each morning, and 24 units each evening, and pen needles (10m x 32G) 1/day. 70 mL 3  ? levothyroxine (SYNTHROID) 50 MCG tablet TAKE 1 TABLET DAILY BEFORE BREAKFAST. 90 tablet 1  ? meloxicam (MOBIC) 7.5 MG tablet Take 1 tablet (7.5 mg total) by mouth daily as needed for pain.    ? methocarbamol (ROBAXIN) 500 MG tablet TAKE 1 TABLET EVERY 8 HOURS AS NEEDED FOR MUSCLE SPASMS. 90 tablet 3  ? nystatin (MYCOSTATIN/NYSTOP) powder Apply 1 application topically daily as needed (yeast under belly).    ? pantoprazole (PROTONIX) 40 MG tablet TAKE 1 TABLET TWICE DAILY 180 tablet 1  ? potassium chloride SA (KLOR-CON M) 20 MEQ tablet TAKE 1 TABLET EVERY DAY 90 tablet 0  ? rosuvastatin (CRESTOR) 20 MG tablet TAKE 1 TABLET (20 MG TOTAL) BY MOUTH AT BEDTIME. 90 tablet 3  ? Tiotropium Bromide-Olodaterol (STIOLTO RESPIMAT) 2.5-2.5 MCG/ACT AERS Inhale 2 puffs into the lungs daily. 4 g 0  ? Tiotropium Bromide-Olodaterol 2.5-2.5 MCG/ACT AERS Inhale 2 puffs into the lungs daily. 4 g 6  ? TRUE METRIX BLOOD GLUCOSE TEST test strip     ? valsartan (DIOVAN) 80 MG tablet TAKE 1 TABLET EVERY DAY 90 tablet 0  ? benzonatate (TESSALON) 100 MG capsule Take 1-2 capsules (100-200 mg total) by mouth 3 (three) times daily as needed for cough. 30 capsule 1  ? ?No facility-administered medications prior to visit.  ? ? ? ?Review of Systems:  ? ?Constitutional:   No  weight loss, night sweats,  Fevers, chills,  ?+fatigue, or  lassitude. ? ?HEENT:   No headaches,  Difficulty swallowing,  Tooth/dental problems, or  Sore throat,  ?              No sneezing, itching, ear ache,  ?+nasal congestion, post nasal drip,  ? ?CV:  No chest pain,  Orthopnea, PND, swelling in lower extremities, anasarca, dizziness, palpitations, syncope.  ? ?GI  No heartburn, indigestion, abdominal pain, nausea, vomiting, diarrhea, change in bowel habits, loss of appetite, bloody stools.  ? ?Resp: g.  No chest wall  deformity ? ?Skin: no rash or lesions. ? ?GU: no dysuria, change in color of urine, no urgency or frequency.  No flank pain, no hematuria  ? ?MS:  No joint pain or swelling.  No decreased range of motion.  No back pain. ? ? ? ?Physical Exam ? ?BP 126/70 (BP Location: Left Arm, Patient Position: Sitting, Cuff Size: Large)   Pulse 98   Temp 97.8 ?F (36.6 ?C) (Oral)   Ht '5\' 6"'$  (1.676 m)   Wt 238 lb 3.2 oz (108 kg)   SpO2 96%   BMI 38.45 kg/m?  ? ?GEN: A/Ox3; pleasant , NAD, well nourished, barking cough ?  ?HEENT:  [place]/AT,   NOSE-clear, THROAT-clear, no lesions, no postnasal drip or exudate noted.  ? ?NECK:  Supple w/ fair  ROM; no JVD; normal carotid impulses w/o bruits; no thyromegaly or nodules palpated; no lymphadenopathy.   ? ?RESP scattered rhonchi, speaks in full sentences  no accessory muscle use, no dullness to percussion ? ?CARD:  RRR, no m/r/g, no peripheral edema, pulses intact, no cyanosis or clubbing. ? ?GI:   Soft & nt; nml bowel sounds; no organomegaly or masses detected.  ? ?Musco: Warm bil, no deformities or joint swelling noted.  ? ?Neuro: alert, no focal deficits noted.   ? ?Skin: Warm, no lesions or rashes ? ? ? ?Lab Results: ? ? ? ?BMET ? ? ?ProBNP ?No results found for: PROBNP ? ?Imaging: ?DG Chest 2 View ? ?Result Date: 05/14/2021 ?CLINICAL DATA:  COPD exacerbation. EXAM: CHEST - 2 VIEW COMPARISON:  CT chest 02/06/2021, AP chest 05/19/2020, chest two views 11/15/2019 FINDINGS: Cardiac silhouette and mediastinal contours are within normal limits with mild calcification within aortic arch. There is mild-to-moderate interstitial thickening which appears chronic. No definite focal airspace opacity to indicate pneumonia. No pleural effusion or pneumothorax. Moderate multilevel degenerative disc changes of the thoracic spine. IMPRESSION: Chronic bilateral interstitial thickening favored to represent chronic scarring. No definite pneumonia. There appears to be resolution of the left posterior lower  lobe pneumonia seen on prior 02/06/2021 CT. Electronically Signed   By: Yvonne Kendall M.D.   On: 05/14/2021 12:07   ? ? ? ?   ? View : No data to display.  ?  ?  ?  ? ? ?No results found for: NITRICOXIDE ? ? ? ? ?

## 2021-05-22 NOTE — Patient Instructions (Addendum)
Extend Levaquin '500mg'$  daily for 3 days  ?Mucinex DM Twice daily  As needed   ?Tessalon Three times a day  As needed  cough.  ?Saline nasal spray Twice daily   ?Saline nasal gel At bedtime   ?Albuterol inhaler As needed   ?Add albuterol neb every 6hr as needed (take albuterol inhaler or neb )  ?Continue on Spiriva 2 puffs daily  ?Call if BS >250 as Insulin may need to be adjusted.  ?Follow up in 2 weeks with Dr. Lamonte Sakai  or Magenta Schmiesing NP and As needed   ?Please contact office for sooner follow up if symptoms do not improve or worsen or seek emergency care  ? ? ?

## 2021-05-22 NOTE — Assessment & Plan Note (Signed)
Chest x-ray last visit showed resolution on plain imaging films.  She has a planned CT follow-up in July. ?

## 2021-05-25 MED ORDER — STIOLTO RESPIMAT 2.5-2.5 MCG/ACT IN AERS: 2.0000 | INHALATION_SPRAY | Freq: Every day | RESPIRATORY_TRACT | 0 refills | Status: DC

## 2021-05-25 NOTE — Addendum Note (Signed)
Addended by: Vanessa Barbara on: 05/25/2021 10:36 AM ? ? Modules accepted: Orders ? ?

## 2021-05-28 ENCOUNTER — Ambulatory Visit: Payer: Medicare HMO | Admitting: Endocrinology

## 2021-05-28 DIAGNOSIS — R519 Headache, unspecified: Secondary | ICD-10-CM | POA: Diagnosis not present

## 2021-05-28 DIAGNOSIS — M542 Cervicalgia: Secondary | ICD-10-CM | POA: Diagnosis not present

## 2021-05-28 DIAGNOSIS — M25512 Pain in left shoulder: Secondary | ICD-10-CM | POA: Diagnosis not present

## 2021-05-28 DIAGNOSIS — Z5320 Procedure and treatment not carried out because of patient's decision for unspecified reasons: Secondary | ICD-10-CM | POA: Diagnosis not present

## 2021-05-28 DIAGNOSIS — Z041 Encounter for examination and observation following transport accident: Secondary | ICD-10-CM | POA: Diagnosis not present

## 2021-05-29 ENCOUNTER — Telehealth: Payer: Self-pay | Admitting: Emergency Medicine

## 2021-05-29 MED ORDER — FLUCONAZOLE 150 MG PO TABS: 150.0000 mg | ORAL_TABLET | Freq: Every day | ORAL | 0 refills | Status: DC

## 2021-05-29 NOTE — Telephone Encounter (Signed)
She reports that she has not tried anything for the yeast over the counter. She is having a lot of discharge and itching. She wants to know if you would be ok sending something for the possible yeast infection. Please advise.  ?
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
## 2021-05-29 NOTE — Telephone Encounter (Signed)
Eat yogurt daily  ?Diflucan '150mg'$  x 1 sent to pharmacy .  ?If not better will need to see PCP or GYN  ?Please contact office for sooner follow up if symptoms do not improve or worsen or seek emergency care  ? ?

## 2021-06-01 ENCOUNTER — Encounter: Payer: Self-pay | Admitting: Family Medicine

## 2021-06-01 ENCOUNTER — Ambulatory Visit: Payer: Medicare HMO | Admitting: Family Medicine

## 2021-06-01 VITALS — BP 130/68 | HR 97 | Resp 18 | Ht 66.0 in | Wt 232.0 lb

## 2021-06-01 DIAGNOSIS — S161XXA Strain of muscle, fascia and tendon at neck level, initial encounter: Secondary | ICD-10-CM

## 2021-06-01 DIAGNOSIS — G44319 Acute post-traumatic headache, not intractable: Secondary | ICD-10-CM | POA: Diagnosis not present

## 2021-06-01 DIAGNOSIS — B372 Candidiasis of skin and nail: Secondary | ICD-10-CM

## 2021-06-01 DIAGNOSIS — R918 Other nonspecific abnormal finding of lung field: Secondary | ICD-10-CM
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
## 2021-06-01 NOTE — Patient Instructions (Addendum)
Concussion, Adult  A concussion is a brain injury from a hard, direct hit (trauma) to your head or body. This direct hit causes your brain to quickly shake back and forth inside your skull. A concussion may also be called a mild traumatic brain injury (TBI). Healing from this injury can take time. What are the causes? This condition is caused by: A direct hit to your head, such as: Running into a player during a game. Being hit in a fight. Hitting your head on a hard surface. A quick and sudden movement of the head or neck, such as in a car crash. What are the signs or symptoms? The signs of a concussion can be hard to notice. They may be missed by you, family members, and doctors. You may look fine on the outside but may not act or feel normal. Physical symptoms Headaches. Being dizzy. Problems with body balance. Being sensitive to light or noise. Vomiting or feeling like you may vomit. Being tired. Problems seeing or hearing. Not sleeping or eating as you used to. Seizure. Mental and emotional symptoms Feeling grouchy (irritable). Having mood changes. Problems remembering things. Trouble focusing your mind (concentrating), organizing, or making decisions. Being slow to think, act, react, speak, or read. Feeling worried or nervous (anxious). Feeling sad (depressed). How is this treated? This condition may be treated by: Stopping sports or activity if you are injured. If you hit your head or have signs of concussion: Do not return to sports or activities the same day. Get checked by a doctor before you return to your activities. Resting your body and your mind. Being watched carefully, often at home. Medicines to help with symptoms such as: Headaches. Feeling like you may vomit. Problems with sleep. Avoiding alcohol and drugs. Being asked to go to a concussion clinic or a place to help you recover (rehabilitation center). Recovery from a concussion can take time. Return to  activities only: When you are fully healed. When your doctor says it is safe. Avoid taking strong pain medicines (opioids) for a concussion. Follow these instructions at home: Activity Limit activities that need a lot of thought or focus, such as: Homework or work for your job. Watching TV. Using the computer or phone. Playing memory games and puzzles. Rest. Rest helps your brain heal. Make sure you: Get plenty of sleep. Most adults should get 7-9 hours of sleep each night. Rest during the day. Take naps or breaks when you feel tired. Avoid activity like exercise until your doctor says its safe. Stop any activity that makes symptoms worse. Do not do activities that could cause a second concussion, such as riding a bike or playing sports. Ask your doctor when you can return to your normal activities, such as school, work, sports, and driving. Your ability to react may be slower. Do not do these activities if you are dizzy. General instructions  Take over-the-counter and prescription medicines only as told by your doctor. Do not drink alcohol until your doctor says you can. Watch your symptoms and tell other people to do the same. Other problems can occur after a concussion. Older adults have a higher risk of serious problems. Tell your work manager, teachers, school nurse, school counselor, coach, or athletic trainer about your injury and symptoms. Tell them about what you can or cannot do. Keep all follow-up visits as told by your doctor. This is important. How is this prevented? It is very important that you do not get another brain injury. In   rare cases, another injury can cause brain damage that will not go away, brain swelling, or death. The risk of this is greatest in the first 7-10 days after a head injury. To avoid injuries: Stop activities that could lead to a second concussion, such as contact sports, until your doctor says it is okay. When you return to sports or activities: Do  not crash into other players. This is how most concussions happen. Follow the rules. Respect other players. Do not engage in violent behavior while playing. Get regular exercise. Do strength and balance training. Wear a helmet that fits you well during sports, biking, or other activities. Helmets can help protect you from serious skull and brain injuries, but they do not protect you from a concussion. Even when wearing a helmet, you should avoid being hit in the head. Contact a doctor if: Your symptoms do not get better. You have new symptoms. You have another injury. Get help right away if: You have bad headaches or your headaches get worse. You feel weak or numb in any part of your body. You feel mixed up (confused). Your balance gets worse. You vomit often. You feel more sleepy than normal. You cannot speak well, or have slurred speech. You have a seizure. Others have trouble waking you up. You have changes in how you act. You have changes in how you see (vision). You pass out (lose consciousness). These symptoms may be an emergency. Do not wait to see if the symptoms will go away. Get medical help right away. Call your local emergency services (911 in the [place] Do not drive yourself to the hospital. Summary A concussion is a brain injury from a hard, direct hit (trauma) to your head or body. This condition is treated with rest and careful watching of symptoms. Ask your doctor when you can return to your normal activities, such as school, work, or driving. Get help right away if you have a very bad headache, feel weak in any part of your body, have a seizure, have changes in how you act or see, or if you are mixed up or more sleepy than normal. This information is not intended to replace advice given to you by your health care provider. Make sure you discuss any questions you have with your health care provider. Document Revised: 03/20/2020 Document Reviewed: 03/20/2020 Elsevier  Patient Education  2023 [employer]  

## 2021-06-02 ENCOUNTER — Encounter: Payer: Self-pay | Admitting: Family Medicine

## 2021-06-02 MED ORDER — NYSTATIN 100000 UNIT/GM EX POWD: 1.0000 "application " | Freq: Every day | CUTANEOUS | 2 refills | Status: DC | PRN

## 2021-06-08 ENCOUNTER — Ambulatory Visit (INDEPENDENT_AMBULATORY_CARE_PROVIDER_SITE_OTHER): Payer: Medicare HMO | Admitting: Adult Health

## 2021-06-08 ENCOUNTER — Ambulatory Visit (INDEPENDENT_AMBULATORY_CARE_PROVIDER_SITE_OTHER): Payer: Medicare HMO

## 2021-06-08 ENCOUNTER — Encounter: Payer: Self-pay | Admitting: Adult Health

## 2021-06-08 VITALS — BP 130/80 | HR 94 | Temp 98.1°F | Ht 66.0 in | Wt 240.0 lb

## 2021-06-08 DIAGNOSIS — J441 Chronic obstructive pulmonary disease with (acute) exacerbation: Secondary | ICD-10-CM

## 2021-06-08 DIAGNOSIS — R0781 Pleurodynia: Secondary | ICD-10-CM | POA: Diagnosis not present

## 2021-06-08 DIAGNOSIS — J449 Chronic obstructive pulmonary disease, unspecified: Secondary | ICD-10-CM

## 2021-06-08 DIAGNOSIS — R079 Chest pain, unspecified: Secondary | ICD-10-CM | POA: Diagnosis not present

## 2021-06-08 MED ORDER — STIOLTO RESPIMAT 2.5-2.5 MCG/ACT IN AERS: 2.0000 | INHALATION_SPRAY | Freq: Every day | RESPIRATORY_TRACT | 0 refills | Status: DC
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
## 2021-06-08 NOTE — Addendum Note (Signed)
Addended by: Vanessa Barbara on: 06/08/2021 05:18 PM   Modules accepted: Orders
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
## 2021-06-08 NOTE — Patient Instructions (Addendum)
Chest xray today.  Mucinex DM Twice daily  As needed   Tessalon Three times a day  As needed  cough.  Saline nasal spray Twice daily   Saline nasal gel At bedtime   Albuterol inhaler As needed   Albuterol neb every 6hr as needed (take albuterol inhaler or neb )  Continue on Stiolto 2 puffs daily - call if insurance will not cover.  Follow up in 2 months with Dr. Lamonte Sakai  or Macala Baldonado NP with PFT and As needed   Please contact office for sooner follow up if symptoms do not improve or worsen or seek emergency care

## 2021-06-08 NOTE — Progress Notes (Signed)
ATC home # x1, I was told she had not gotten home from the grocery store. ATC cell number.  No answer.  LVM to return call.
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
## 2021-06-08 NOTE — Progress Notes (Signed)
Patient seen in the office today and instructed on use of Stiolto.  Patient expressed understanding and demonstrated technique. 

## 2021-06-11 ENCOUNTER — Telehealth: Payer: Self-pay | Admitting: Adult Health

## 2021-06-11 NOTE — Telephone Encounter (Signed)
Melvenia Needles, NP  06/08/2021  1:43 PM EDT     Chronic changes in Chest xray, no new areas of pneumonia .  No acute process noted  Cont w/ ov recs Please contact office for sooner follow up if symptoms do not improve or worsen or seek emergency care    Spoke with patient regarding CXR results. They verbalized understanding. No further questions.  Nothing further needed at this time.

## 2021-07-01 ENCOUNTER — Other Ambulatory Visit: Payer: Self-pay | Admitting: Emergency Medicine

## 2021-07-01 ENCOUNTER — Other Ambulatory Visit: Payer: Self-pay | Admitting: Family Medicine

## 2021-07-01 ENCOUNTER — Telehealth: Payer: Self-pay | Admitting: Emergency Medicine

## 2021-07-01 DIAGNOSIS — M545 Low back pain, unspecified: Secondary | ICD-10-CM

## 2021-07-01 DIAGNOSIS — M25519 Pain in unspecified shoulder: Secondary | ICD-10-CM

## 2021-07-01 DIAGNOSIS — R600 Localized edema: Secondary | ICD-10-CM

## 2021-07-01 DIAGNOSIS — I10 Essential (primary) hypertension: Secondary | ICD-10-CM

## 2021-07-01 MED ORDER — VALSARTAN 80 MG PO TABS: 80.0000 mg | ORAL_TABLET | Freq: Every day | ORAL | 0 refills | Status: DC

## 2021-07-01 NOTE — Telephone Encounter (Signed)
Pt has an appt on 07/29/21.

## 2021-07-01 NOTE — Telephone Encounter (Signed)
Requested medication (s) are due for refill today: Yes  Requested medication (s) are on the active medication list: yes    Last refill: Mobic  05/19/20  Amount not specified            Robaxin  07/28/20 #90  3 refills  Future visit scheduled  Yes 07/29/21  Notes to clinic: Mobic historical provider      Robaxin not delegated. Please review. Thank you.  Requested Prescriptions  Pending Prescriptions Disp Refills   meloxicam (MOBIC) 7.5 MG tablet [Pharmacy Med Name: MELOXICAM 7.5 MG Tablet] 90 tablet     Sig: TAKE 1 TABLET EVERY DAY     Analgesics:  COX2 Inhibitors Failed - 07/01/2021  2:12 PM      Failed - Manual Review: Labs are only required if the patient has taken medication for more than 8 weeks.      Failed - HGB in normal range and within 360 days    Hemoglobin  Date Value Ref Range Status  07/28/2020 10.7 (L) 11.7 - 15.5 g/dL Final         Failed - HCT in normal range and within 360 days    HCT  Date Value Ref Range Status  07/28/2020 34.5 (L) 35.0 - 45.0 % Final         Passed - Cr in normal range and within 360 days    Creat  Date Value Ref Range Status  07/28/2020 0.97 0.60 - 1.00 mg/dL Final         Passed - AST in normal range and within 360 days    AST  Date Value Ref Range Status  07/28/2020 27 10 - 35 U/L Final         Passed - ALT in normal range and within 360 days    ALT  Date Value Ref Range Status  07/28/2020 20 6 - 29 U/L Final         Passed - eGFR is 30 or above and within 360 days    GFR, Est African American  Date Value Ref Range Status  11/15/2019 73 > OR = 60 mL/min/1.70m2 Final   GFR, Est Non African American  Date Value Ref Range Status  11/15/2019 63 > OR = 60 mL/min/1.75m2 Final   GFR  Date Value Ref Range Status  05/13/2020 59.43 (L) >60.00 mL/min Final    Comment:    Calculated using the CKD-EPI Creatinine Equation (2021)   eGFR  Date Value Ref Range Status  07/28/2020 62 > OR = 60 mL/min/1.62m2 Final    Comment:    The  eGFR is based on the CKD-EPI 2021 equation. To calculate  the new eGFR from a previous Creatinine or Cystatin C result, go to https://www.kidney.org/professionals/ kdoqi/gfr%5Fcalculator          Passed - Patient is not pregnant      Passed - Valid encounter within last 12 months    Recent Outpatient Visits           1 month ago Acute post-traumatic headache, not intractable   [facility] [name], PA-C   2 months ago Upper respiratory tract infection, unspecified type   [facility] [name], PA-C   3 months ago Skin lesion of neck   [place], DO   5 months ago Primary hypertension   [place], DO   11 months ago Diabetes mellitus type 2 with complications, uncontrolled (  [facility])   [facility] [name], PA-C       Future Appointments             In 4 weeks Teodora Medici, DO [facility], [place]   In 1 month Byrum, Rose Fillers, MD [facility]   In 3 months  [facility], [facility]             methocarbamol (ROBAXIN) 500 MG tablet [Pharmacy Med Name: METHOCARBAMOL 500 MG Tablet] 90 tablet 3    Sig: TAKE 1 TABLET EVERY 8 HOURS AS NEEDED FOR MUSCLE SPASMS.     Not Delegated - Analgesics:  Muscle Relaxants Failed - 07/01/2021  2:12 PM      Failed - This refill cannot be delegated      Passed - Valid encounter within last 6 months    Recent Outpatient Visits           1 month ago Acute post-traumatic headache, not intractable   [facility] [name], [facility]-C   2 months ago Upper respiratory tract infection, unspecified type   [facility] [name], PA-C   3 months ago Skin lesion of neck   [place], DO   5 months ago Primary hypertension   [facility] Teodora Medici, DO    11 months ago Diabetes mellitus type 2 with complications, uncontrolled [facility])   [facility] [name], PA-C       Future Appointments             In 4 weeks Teodora Medici, DO [facility], [place]   In 1 month Byrum, Rose Fillers, MD [facility]   In 3 months  [facility], [facility]            Signed Prescriptions Disp Refills   Accu-Chek Softclix Lancets lancets 200 each 11    Sig: TEST BLOOD SUGAR TWICE DAILY     Endocrinology: Diabetes - Testing Supplies Passed - 07/01/2021  2:12 PM      Passed - Valid encounter within last 12 months    Recent Outpatient Visits           1 month ago Acute post-traumatic headache, not intractable   [facility] [name], PA-C   2 months ago Upper respiratory tract infection, unspecified type   [facility] [name], PA-C   3 months ago Skin lesion of neck   [place], DO   5 months ago Primary hypertension   [facility] Teodora Medici, DO   11 months ago Diabetes mellitus type 2 with complications, uncontrolled [facility])   [facility] [name], PA-C       Future Appointments             In 4 weeks Teodora Medici, DO [facility], [place]   In 1 month Byrum, Rose Fillers, MD  Pulmonary Care   In 3 months  [facility], [facility]             hydrochlorothiazide (HYDRODIURIL) 25 MG tablet 90 tablet 0    Sig: TAKE 1 TABLET EVERY DAY     Cardiovascular: Diuretics - Thiazide Failed - 07/01/2021  2:12 PM      Failed - Cr in normal range and within 180 days    Creat  Date  Value Ref Range Status  07/28/2020 0.97 0.60 - 1.00 mg/dL Final         Failed - K in normal range and within 180 days    Potassium  Date Value Ref Range Status  07/28/2020 4.8 3.5 - 5.3 mmol/L Final         Failed - Na in  normal range and within 180 days    Sodium  Date Value Ref Range Status  07/28/2020 138 135 - 146 mmol/L Final         Passed - Last BP in normal range    BP Readings from Last 1 Encounters:  06/08/21 130/80         Passed - Valid encounter within last 6 months    Recent Outpatient Visits           1 month ago Acute post-traumatic headache, not intractable   [facility] [name], PA-C   2 months ago Upper respiratory tract infection, unspecified type   [facility] [name], PA-C   3 months ago Skin lesion of neck   [place], DO   5 months ago Primary hypertension   [place], DO   11 months ago Diabetes mellitus type 2 with complications, uncontrolled [facility])   [facility] [name], PA-C       Future Appointments             In 4 weeks Teodora Medici, DO [facility], [place]   In 1 month Byrum, Rose Fillers, MD [facility]   In 3 months  [place]

## 2021-07-01 NOTE — Telephone Encounter (Signed)
Called and spoke with patient. She confirmed that she is in need of a refill for her valsartan. Advised her that I would go ahead and send in the RX for her.   Nothing further needed at time of call.

## 2021-07-01 NOTE — Telephone Encounter (Signed)
Requested Prescriptions  Pending Prescriptions Disp Refills  . Accu-Chek Softclix Lancets lancets [Pharmacy Med Name: ACCU-CHEK SOFTCLIX LANCETS] 200 each 11    Sig: TEST BLOOD SUGAR TWICE DAILY     Endocrinology: Diabetes - Testing Supplies Passed - 07/01/2021  2:12 PM      Passed - Valid encounter within last 12 months    Recent Outpatient Visits          1 month ago Acute post-traumatic headache, not intractable   [facility] [name], PA-C   2 months ago Upper respiratory tract infection, unspecified type   [facility] [name], PA-C   3 months ago Skin lesion of neck   [place], DO   5 months ago Primary hypertension   [facility] Teodora Medici, DO   11 months ago Diabetes mellitus type 2 with complications, uncontrolled [facility])   [facility] [name], PA-C      Future Appointments            In 4 weeks Teodora Medici, [facility], [place]   In 1 month Byrum, Rose Fillers, MD [facility]   In 3 months  [facility], [facility]           . meloxicam ([place]) 7.5 MG tablet [Pharmacy Med Name: MELOXICAM 7.5 MG Tablet] 90 tablet     Sig: TAKE 1 TABLET EVERY DAY     Analgesics:  COX2 Inhibitors Failed - 07/01/2021  2:12 PM      Failed - Manual Review: Labs are only required if the patient has taken medication for more than 8 weeks.      Failed - HGB in normal range and within 360 days    Hemoglobin  Date Value Ref Range Status  07/28/2020 10.7 (L) 11.7 - 15.5 g/dL Final         Failed - HCT in normal range and within 360 days    HCT  Date Value Ref Range Status  07/28/2020 34.5 (L) 35.0 - 45.0 % Final         Passed - Cr in normal range and within 360 days    Creat  Date Value Ref Range Status  07/28/2020 0.97 0.60 - 1.00 mg/dL Final         Passed - AST in normal range and within 360  days    AST  Date Value Ref Range Status  07/28/2020 27 10 - 35 U/L Final         Passed - ALT in normal range and within 360 days    ALT  Date Value Ref Range Status  07/28/2020 20 6 - 29 U/L Final         Passed - eGFR is 30 or above and within 360 days    GFR, Est African American  Date Value Ref Range Status  11/15/2019 73 > OR = 60 mL/min/1.72m2 Final   GFR, Est Non African American  Date Value Ref Range Status  11/15/2019 63 > OR = 60 mL/min/1.8m2 Final   GFR  Date Value Ref Range Status  05/13/2020 59.43 (L) >60.00 mL/min Final    Comment:    Calculated using the CKD-EPI Creatinine Equation (2021)   eGFR  Date Value Ref Range Status  07/28/2020 62 > OR = 60 mL/min/1.46m2 Final    Comment:    The eGFR is based on the CKD-EPI 2021 equation. To  calculate  the new eGFR from a previous Creatinine or Cystatin C result, go to https://www.kidney.org/professionals/ kdoqi/gfr%5Fcalculator          Passed - Patient is not pregnant      Passed - Valid encounter within last 12 months    Recent Outpatient Visits          1 month ago Acute post-traumatic headache, not intractable   [facility] [name], PA-C   2 months ago Upper respiratory tract infection, unspecified type   [facility] [name], PA-C   3 months ago Skin lesion of neck   [place], DO   5 months ago Primary hypertension   [facility] Teodora Medic[facility], DO   11 months ago Diabetes mellitus type 2 with complications, uncontrolled [facility])   [facility] [name], PA-C      Future Appointments            In 4 weeks Teodora Medici, [facility], [place]   In 1 month Byrum, Rose Fillers, MD [place] [facility]   In 3 months  [facility], [facility]           . methocarbamol (ROBAXIN) 500 MG tablet [Pharmacy Med Name: METHOCARBAMOL 500  MG Tablet] 90 tablet 3    Sig: TAKE 1 TABLET EVERY 8 HOURS AS NEEDED FOR MUSCLE SPASMS.     Not Delegated - Analgesics:  Muscle Relaxants Failed - 07/01/2021  2:12 PM      Failed - This refill cannot be delegated      Passed - Valid encounter within last 6 months    Recent Outpatient Visits          1 month ago Acute post-traumatic headache, not intractable   [facility] [name], [facility]-C   2 months ago Upper respiratory tract infection, unspecified type   [facility] [name], [facility]-C   3 months ago Skin lesion of neck   [place], DO   5 months ago Primary hypertension   [facility] Teodora Medic[facility], DO   11 months ago Diabetes mellitus type 2 with complications, uncontrolled ([place])   [facility] [name], PA-C      Future Appointments            In 4 weeks Teodora Medici, [facility], [place]   In 1 month Byrum, Rose Fillers, MD [place] [facility]   In 3 months  [facility], [facility]           . hydrochlorothiazide (HYDRODIURIL) 25 MG tablet [Pharmacy Med Name: HYDROCHLOROTHIAZIDE 25 MG Tablet] 90 tablet 1    Sig: TAKE 1 TABLET EVERY DAY     Cardiovascular: Diuretics - Thiazide Failed - 07/01/2021  2:12 PM      Failed - Cr in normal range and within 180 days    Creat  Date Value Ref Range Status  07/28/2020 0.97 0.60 - 1.00 mg/dL Final         Failed - K in normal range and within 180 days    Potassium  Date Value Ref Range Status  07/28/2020 4.8 3.5 - 5.3 mmol/L Final         Failed - Na in normal range and within 180 days    Sodium  Date Value Ref Range Status  07/28/2020 138 135 -  146 mmol/L Final         Passed - Last BP in normal range    BP Readings from Last 1 Encounters:  06/08/21 130/80         Passed - Valid encounter within last 6 months    Recent Outpatient Visits          1  month ago Acute post-traumatic headache, not intractable   [facility] [name], PA-C   2 months ago Upper respiratory tract infection, unspecified type   [facility] [name], PA-C   3 months ago Skin lesion of neck   [place], DO   5 months ago Primary hypertension   [facility] Teodora Medic[facility], DO   11 months ago Diabetes mellitus type 2 with complications, uncontrolled [facility])   [facility] [name], PA-C      Future Appointments            In 4 weeks Teodora Medici, DO [facility], [place]   In 1 month Byrum, Rose Fillers, MD [place] [facility]   In 3 months  [facility], [facility]

## 2021-07-02 NOTE — Telephone Encounter (Signed)
Refill sent in on 07/01/21

## 2021-07-05 ENCOUNTER — Other Ambulatory Visit: Payer: Self-pay

## 2021-07-05 MED ORDER — DROPLET PEN NEEDLES 32G X 4 MM MISC: 1.0000 | Freq: Every day | 3 refills | Status: DC

## 2021-07-06 ENCOUNTER — Other Ambulatory Visit: Payer: Self-pay

## 2021-07-06 DIAGNOSIS — E1165 Type 2 diabetes mellitus with hyperglycemia: Secondary | ICD-10-CM

## 2021-07-06 MED ORDER — NOVOLIN N FLEXPEN 100 UNIT/ML ~~LOC~~ SUPN: PEN_INJECTOR | SUBCUTANEOUS | 3 refills | Status: DC

## 2021-07-20 ENCOUNTER — Telehealth: Payer: Self-pay

## 2021-07-20 NOTE — Progress Notes (Signed)
    Chronic Care Management Pharmacy Assistant   Name: Tamara [name]  MRN: [id number] DOB: 1945/05/08  Patient called to be reminded of her telephone appointment with Junius Argyle, CPP on 07/22/2021 @ 1500  Patient's spouse was advised of appointment date, time, and type of appointment (either telephone or in person). Patient aware to have/bring all medications, supplements, blood pressure and/or blood sugar logs to visit.  Star Rating Drug: Valsartan 80 mg last filled on 07/01/2021 for a 90-Day supply with [place] Rosuvastatin 20 mg last filled on 07/07/2021 for a 90-Day supply with [place]  Any gaps in medications fill history? None  Care Gaps: COVID-19 Vaccine Zoster Vaccine Diabetic Eye Exam Diabetic Foot Exam Mammogram  Lynann Bologna, CPA/CMA Clinical Pharmacist Assistant Phone: [phone]

## 2021-07-22 ENCOUNTER — Other Ambulatory Visit: Payer: Self-pay | Admitting: Family Medicine

## 2021-07-22 ENCOUNTER — Ambulatory Visit: Payer: Medicare HMO

## 2021-07-22 DIAGNOSIS — J441 Chronic obstructive pulmonary disease with (acute) exacerbation: Secondary | ICD-10-CM

## 2021-07-22 DIAGNOSIS — Z794 Long term (current) use of insulin: Secondary | ICD-10-CM

## 2021-07-22 DIAGNOSIS — M25519 Pain in unspecified shoulder: Secondary | ICD-10-CM

## 2021-07-22 DIAGNOSIS — M545 Low back pain, unspecified: Secondary | ICD-10-CM

## 2021-07-22 MED ORDER — VALSARTAN 80 MG PO TABS: 80.0000 mg | ORAL_TABLET | Freq: Every day | ORAL | 3 refills | Status: DC

## 2021-07-22 MED ORDER — METHOCARBAMOL 500 MG PO TABS: ORAL_TABLET | ORAL | 3 refills | Status: DC

## 2021-07-22 MED ORDER — MELOXICAM 7.5 MG PO TABS
7.5000 mg | ORAL_TABLET | Freq: Every day | ORAL | 2 refills | Status: DC | PRN
Start: 2021-07-22 — End: 2021-08-26

## 2021-07-22 NOTE — Patient Instructions (Signed)
Visit Information It was great speaking with you today!  Please let me know if you have any questions about our visit.   Goals Addressed             This Visit's Progress    Monitor and Manage My Blood Sugar-Diabetes Type 2   On track    Timeframe:  Long-Range Goal Priority:  High Start Date:  05/28/2020                            Expected End Date:  11/28/2021                     Follow Up within 90 days    -check blood sugar twice daily; before breakfast and before supper - check blood sugar if I feel it is too high or too low - enter blood sugar readings and medication or insulin into daily log - take the blood sugar meter to all doctor visits    Why is this important?   Checking your blood sugar at home helps to keep it from getting very high or very low.  Writing the results in a diary or log helps the doctor know how to care for you.  Your blood sugar log should have the time, date and the results.  Also, write down the amount of insulin or other medicine that you take.  Other information, like what you ate, exercise done and how you were feeling, will also be helpful.     Notes:         Patient Care Plan: General Pharmacy (Adult)     Problem Identified: Hypertension, Hyperlipidemia, Diabetes, GERD and COPD   Priority: High     Long-Range Goal: Patient-Specific Goal   Start Date: 05/28/2020  Expected End Date: 10/17/2021  This Visit's Progress: On track  Recent Progress: On track  Priority: High  Note:   Current Barriers:  Unable to achieve control of COPD  Unable to maintain control of blood pressure  Pharmacist Clinical Goal(s):  Patient will achieve control of COPD as evidenced by stable breathing per patient report maintain control of blood pressure as evidenced by BP less than 140/90  through collaboration with PharmD and provider.   Interventions: 1:1 collaboration with [name], PA-C regarding development and update of comprehensive plan of  care as evidenced by provider attestation and co-signature Inter-disciplinary care team collaboration (see longitudinal plan of care) Comprehensive medication review performed; medication list updated in electronic medical record  Hypertension (BP goal <140/90) -Uncontrolled -Current treatment: Furosmide 20 mg daily as needed Hydrochlorothiazide 25 mg daily  Valsartan 80 mg daily  -Medications previously tried: NA  -Current home readings: Has not been routinely monitoring -Denies hypotensive/hypertensive symptoms -Educated on Daily salt intake goal < 2300 mg; Importance of home blood pressure monitoring; -Counseled to monitor BP at home weekly, document, and provide log at future appointments  Hyperlipidemia: (LDL goal < 70) -Controlled -Current treatment: Rosuvastatin 20 mg daily  -Medications previously tried: NA  -Educated on Importance of limiting foods high in cholesterol; -Recommended to continue current medication  Diabetes (A1c goal <8%) -Uncontrolled per recent patient reported readings  -Current medications: NPH 40 units AM, 25 units nightly: Appropriate, Effective, Safe, Accessible -Medications previously tried: Metformin (worried it "destroys" her kidneys) -Current home glucose readings fasting glucose: "elevated"  post prandial glucose: NA -Denies hypoglycemic/hyperglycemic symptoms -Discussed that kidney damage is related to progression of diabetes, did not  address resumption of metformin at this visit due to other patient concerns. Patient has follow-up with endocrinology later this month.   COPD (Goal: control symptoms and prevent exacerbations) -Uncontrolled -Current treatment  Albuterol nebulizer as needed: Appropriate, Effective, Safe, Accessible   Ventolin HFA 2 puffs every 6 hours as needed: Appropriate, Effective, Safe, Accessible  Spiriva: Query appropriate  -Medications previously tried: Trelegy (worsened cough)  -Patient was given samples and Rx for  Stiolto, has only been using her Spiriva at home.  -Current COPD Classification:  B (high sx, <2 exacerbations/yr) - Possibly E if second exacerbation -MMRC score: 3 CAT ASSESSMENT  Rank each of the following items on a scale of 0 to 5 (with 5 being most severe) Write a # 0-5 in each box  I never cough (0) > I cough all the time (5) 5 - Did not cough during our visit today  I have no phlegm (mucus) in my chest (0) > My chest is completely full of phlegm (mucus) (5) 4  My chest does not feel tight at all (0) > My chest feels very tight (5) 3  When I walk up a hill or one flight of stairs I am not breathless (0) > When I walk up a hill or one flight of stairs I am very breathless (5) 4  I am not limited doing any activities at home (0) > I am very limited doing activities at home (5) 3  I am confident leaving my home despite my lung function (0) > I am not at all confident leaving my home because of my lung condition (5)  2  I sleep soundly (0) > I don't sleep soundly because of my lung condition (5) 3  I have lots of energy (0) > I have no energy at all (5) 3  -Total CAT Score: 27 -Exacerbations requiring treatment in last 6 months: Yes 4/27, requiring abx and steroids  -Patient reports consistent use of maintenance inhaler -Frequency of rescue inhaler use: multiple times daily if not using her nebulizer.  -Recommended patient stop Spiriva and begin Stiolto 2 puffs daily as directed by [employer], NP's last visit.  -Will reach out to pulmonology for assessment of potential COPD exacerbation and short course of antibiotics/steroids if needed.   Hypothyroidism (Goal: Maintain stable thyroid function) -Controlled -Current treatment  Levothyroxine 25 mcg daily  -Medications previously tried: NA  -Recommended to continue current medication  GERD (Goal: Prevent heartburn/reflux) -Controlled -Current treatment  Pantoprazole 40 mg twice daily  -Medications previously tried: NA -Patient with  chronic cough of unclear etiology. Per patient reflux symptoms have been well controlled.    -Recommended to continue current medication  Patient Goals/Self-Care Activities Patient will:  - check glucose twice daily; before breakfast and supper, document, and provide at future appointments check blood pressure three times weekly, document, and provide at future appointments  Follow Up Plan: Telephone follow up appointment with care management team member scheduled for:  09/02/21 at 1:00 PM      Patient agreed to services and verbal consent obtained.   The patient verbalized understanding of instructions, educational materials, and care plan provided today and DECLINED offer to receive copy of patient instructions, educational materials, and care plan.   Malva Limes, [place] Pharmacist Practitioner  [facility] [phone]

## 2021-07-22 NOTE — Progress Notes (Signed)
Pt med refilled  Meds ordered this encounter  Medications   methocarbamol (ROBAXIN) 500 MG tablet    Sig: TAKE 1 TABLET EVERY 8 HOURS AS NEEDED FOR MUSCLE SPASMS.    Dispense:  90 tablet    Refill:  3    Order Specific Question:   Supervising Provider    Answer:   Steele Sizer [[id number]]   meloxicam (MOBIC) 7.5 MG tablet    Sig: Take 1 tablet (7.5 mg total) by mouth daily as needed for pain.    Dispense:  30 tablet    Refill:  2    Order Specific Question:   Supervising Provider    Answer:   Steele Sizer [[id number]]   [name], PA-C

## 2021-07-22 NOTE — Progress Notes (Signed)
Chronic Care Management Pharmacy Note  07/22/2021 Name:  Tamara [name] MRN:  [id number] DOB:  [birth year]  Summary: Patient presents for CCM follow-up.   Patient with worsening COPD symptoms and concerns for potential COPD exacerbation. MMRC =3, CAT score 27. Of note, patient was given samples and Rx for Stiolto, has only been using her Spiriva at home.   Of note her symptoms are complicated by pain from recent MVI and her pain medications were not refilled for her.   She stopped her metformin due to concerns it would damage her kidneys.   Recommendations/Changes made from today's visit: -Recommended patient stop Spiriva and begin Stiolto 2 puffs daily as directed by [employer], NP's last visit.   -Will reach out to pulmonology for assessment of potential COPD exacerbation and short course of antibiotics/steroids if needed.   -Will reach out to PCP about pain medication refills  -Recommended patient discuss at upcoming endocrinology appointment about potentially restarting metformin   Plan: [facility] COPD Assessment in two weeks  CPP follow-up 1 month  Subjective: Tamara [name] is an 76 y.o. year old female who is a primary patient of [name], [place].  The CCM team was consulted for assistance with disease management and care coordination needs.    Engaged with patient by telephone for follow up visit in response to provider referral for pharmacy case management and/or care coordination services.   Consent to Services:  The patient was given information about Chronic Care Management services, agreed to services, and gave verbal consent prior to initiation of services.  Please see initial visit note for detailed documentation.   Patient Care Team: [name], PA-C as PCP - General (Family Medicine) Germaine Pomfret, [facility] as Pharmacist (Pharmacist)  Recent office visits: 06/01/21: Patient presented to [name], PA-C for follow-up.  05/01/21: Patient presented to  [name], PA-C for URI.   Recent consult visits:  06/08/21: Patient presented to Rexene Edison, NP (COPD) for follow-up.  05/22/21: Patient presented to Rexene Edison, NP (COPD) for COPD exacerbation. Levofloxacin + prednisone.  05/14/21: Patient presented to Rexene Edison, NP (COPD) for COPD exacerbation  Hospital visits: None in past 6 months  Objective:  Lab Results  Component Value Date   CREATININE 0.97 07/28/2020   BUN 15 07/28/2020   GFR 59.43 (L) 05/13/2020   GFRNONAA 63 11/15/2019   GFRAA 73 11/15/2019   NA 138 07/28/2020   K 4.8 07/28/2020   CALCIUM 11.0 (H) 07/28/2020   CO2 28 07/28/2020   GLUCOSE 197 (H) 07/28/2020    Lab Results  Component Value Date/Time   HGBA1C 8.7 (A) 03/26/2021 01:16 PM   HGBA1C 7.3 (A) 11/26/2020 01:20 PM   HGBA1C 10.8 (H) 03/22/2019 10:20 AM   HGBA1C 8.2 (H) 11/22/2018 12:00 AM   GFR 59.43 (L) 05/13/2020 02:38 PM   MICROALBUR 0.5 09/05/2017 08:17 AM   MICROALBUR 0.2 08/23/2016 08:25 AM    Last diabetic Eye exam:  Lab Results  Component Value Date/Time   HMDIABEYEEXA No Retinopathy 12/29/2017 12:00 AM    Last diabetic Foot exam: No results found for: "HMDIABFOOTEX"   Lab Results  Component Value Date   CHOL 181 07/28/2020   HDL 54 07/28/2020   LDLCALC 100 (H) 07/28/2020   TRIG 176 (H) 07/28/2020   CHOLHDL 3.4 07/28/2020       Latest Ref Rng & Units 07/28/2020   10:26 AM 11/15/2019    3:23 PM 03/22/2019   10:20 AM  Hepatic Function  Total  Protein 6.1 - 8.1 g/dL 6.9  6.9  6.7   AST 10 - 35 U/L '27  31  28   '$ ALT 6 - 29 U/L '20  22  22   '$ Total Bilirubin 0.2 - 1.2 mg/dL 0.5  0.4  0.4     Lab Results  Component Value Date/Time   TSH 3.88 07/28/2020 10:26 AM   TSH 2.24 11/15/2019 03:23 PM   FREET4 1.2 11/22/2018 12:00 AM       Latest Ref Rng & Units 07/28/2020   10:26 AM 05/13/2020    2:38 PM 04/08/2020    3:01 PM  CBC  WBC 3.8 - 10.8 Thousand/uL 6.4  7.6  7.1   Hemoglobin 11.7 - 15.5 g/dL 10.7  11.1  11.2    Hematocrit 35.0 - 45.0 % 34.5  33.1  33.3   Platelets 140 - 400 Thousand/uL 154  159.0  154.0     No results found for: "VD25OH"  Clinical ASCVD: No  The 10-year ASCVD risk score (Arnett DK, et al., 2019) is: 34.1%   Values used to calculate the score:     Age: 76 years     Sex: Female     Is Non-Hispanic African American: No     Diabetic: Yes     Tobacco smoker: No     Systolic Blood Pressure: 741 mmHg     Is BP treated: Yes     HDL Cholesterol: 54 mg/dL     Total Cholesterol: 181 mg/dL       06/01/2021    3:10 PM 05/01/2021    1:33 PM 03/30/2021   11:26 AM  Depression screen PHQ 2/9  Decreased Interest 0 0 0  Down, Depressed, Hopeless 0 0 0  PHQ - 2 Score 0 0 0  Altered sleeping  0 0  Tired, decreased energy  0 0  Change in appetite  0 0  Feeling bad or failure about yourself   0 0  Trouble concentrating  0 0  Moving slowly or fidgety/restless  0 0  Suicidal thoughts  0 0  PHQ-9 Score  0 0  Difficult doing work/chores  Not difficult at all Not difficult at all     Social History   Tobacco Use  Smoking Status Former   Packs/day: 1.00   Years: 40.00   Total pack years: 40.00   Types: Cigarettes   Quit date: 2006   Years since quitting: 17.5  Smokeless Tobacco Never  Tobacco Comments   Smoking cessation materials not required   BP Readings from Last 3 Encounters:  06/08/21 130/80  06/01/21 130/68  05/22/21 126/70   Pulse Readings from Last 3 Encounters:  06/08/21 94  06/01/21 97  05/22/21 98   Wt Readings from Last 3 Encounters:  06/08/21 240 lb (108.9 kg)  06/01/21 232 lb (105.2 kg)  05/22/21 238 lb 3.2 oz (108 kg)   BMI Readings from Last 3 Encounters:  06/08/21 38.74 kg/m  06/01/21 37.45 kg/m  05/22/21 38.45 kg/m    Assessment/Interventions: Review of patient past medical history, allergies, medications, health status, including review of consultants reports, laboratory and other test data, was performed as part of comprehensive evaluation  and provision of chronic care management services.   SDOH:  (Social Determinants of Health) assessments and interventions performed: Yes    SDOH Screenings   Alcohol Screen: Low Risk  (10/16/2020)   Alcohol Screen    Last Alcohol Screening Score (AUDIT): 0  Depression (PHQ2-9): Low Risk  (  06/01/2021)   Depression (PHQ2-9)    PHQ-2 Score: 0  Financial Resource Strain: Medium Risk (02/02/2021)   Overall Financial Resource Strain (CARDIA)    Difficulty of Paying Living Expenses: Somewhat hard  Food Insecurity: No Food Insecurity (10/16/2020)   Hunger Vital Sign    Worried About Running Out of Food in the Last Year: Never true    Ran Out of Food in the Last Year: Never true  Housing: Low Risk  (10/16/2020)   Housing    Last Housing Risk Score: 0  Physical Activity: Inactive (10/16/2020)   Exercise Vital Sign    Days of Exercise per Week: 0 days    Minutes of Exercise per Session: 0 min  Social Connections: Moderately Isolated (10/16/2020)   Social Connection and Isolation Panel [NHANES]    Frequency of Communication with Friends and Family: More than three times a week    Frequency of Social Gatherings with Friends and Family: More than three times a week    Attends Religious Services: Never    Marine scientist or Organizations: No    Attends Archivist Meetings: Never    Marital Status: Married  Stress: No Stress Concern Present (10/16/2020)   [employer] of Stress : Not at all  Tobacco Use: Medium Risk (06/08/2021)   Patient History    Smoking Tobacco Use: Former    Smokeless Tobacco Use: Never    Passive Exposure: Not on file  Transportation Needs: No Transportation Needs (10/16/2020)   PRAPARE - Hydrologist (Medical): No    Lack of Transportation (Non-Medical): No    CCM Care Plan  Allergies  Allergen Reactions   Penicillins Hives    Reaction: 1964     Medications Reviewed Today     Reviewed by Nolon Stalls, Kimber Relic, RN (Registered Nurse) on 06/08/21 at 1009  Med List Status: <None>   Medication Order Taking? Sig Documenting Provider Last Dose Status Informant  Accu-Chek Softclix Lancets lancets [id number] Yes TEST BLOOD SUGAR TWICE DAILY. [name], PA-C Taking Active Self  albuterol (PROVENTIL) (2.5 MG/3ML) 0.083% nebulizer solution [id number] Yes Take 3 mLs (2.5 mg total) by nebulization every 6 (six) hours as needed for wheezing or shortness of breath. Parrett, Fonnie Mu, NP Taking Active   albuterol (VENTOLIN HFA) 108 (90 Base) MCG/ACT inhaler [id number] Yes Inhale 2 puffs into the lungs every 6 (six) hours as needed for wheezing or shortness of breath. Collene Gobble, MD Taking Active   Alcohol Swabs (B-D SINGLE USE SWABS REGULAR) PADS [id number] Yes USE  TO TEST BLOOD SUGAR TWICE DAILY [name], PA-C Taking Active   Ascorbic Acid (VITAMIN C) 1000 MG tablet [id number] Yes Take 1,000 mg by mouth daily. [provider] Taking Active Self  benzonatate (TESSALON) 200 MG capsule [id number] Yes Take 1 capsule (200 mg total) by mouth 3 (three) times daily as needed for cough. Parrett, Fonnie Mu, NP Taking Active   Cholecalciferol (VITAMIN D) 50 MCG (2000 UT) CAPS [id number] Yes Take 1,000 Units by mouth daily. [provider] Taking Active Self  DROPLET PEN NEEDLES 32G X 4 MM MISC [id number] Yes 1 Device by Other route daily. Renato Shin, MD Taking Active Self  fluconazole (DIFLUCAN) 150 MG tablet [id number] Yes Take 1 tablet (150 mg total) by mouth daily. Parrett, Fonnie Mu, NP Taking Active   furosemide (LASIX) 20 MG tablet [id number] Yes Take 1 tablet (  20 mg total) by mouth daily as needed for fluid or edema. [name], PA-C Taking Active Self  hydrochlorothiazide (HYDRODIURIL) 25 MG tablet [id number] Yes TAKE 1 TABLET (25 MG TOTAL) BY MOUTH DAILY. [name], PA-C Taking Active Self  Insulin NPH, Human,, Isophane,  (NOVOLIN N FLEXPEN) 100 UNIT/ML Mayer Masker [id number] Yes 40 units each morning, and 24 units each evening, and pen needles (29m x 32G) 1/day. ERenato Shin MD Taking Active   levothyroxine (SYNTHROID) 50 MCG tablet [id number] TAKE 1 TABLET DAILY BEFORE BREAKFAST. TDelsa Grana PA-C Taking Active   meloxicam (MOBIC) 7.5 MG tablet [id number] Take 1 tablet (7.5 mg total) by mouth daily as needed for pain. BCollene Gobble MD Taking Active   methocarbamol (ROBAXIN) 500 MG tablet [id number] TAKE 1 TABLET EVERY 8 HOURS AS NEEDED FOR MUSCLE SPASMS. TDelsa Grana PA-C Taking Active   nystatin (MYCOSTATIN/NYSTOP) powder [id number] Apply 1 application. topically daily as needed (yeast under belly). TDelsa Grana PA-C Taking Active   pantoprazole (PROTONIX) 40 MG tablet [id number] TAKE 1 TABLET TWICE DAILY Byrum, RRose Fillers MD Taking Active   potassium chloride SA (KLOR-CON M) 20 MEQ tablet [id number] TAKE 1 TABLET EVERY DAY TDelsa Grana PA-C Taking Active   rosuvastatin (CRESTOR) 20 MG tablet [id number] TAKE 1 TABLET (20 MG TOTAL) BY MOUTH AT BEDTIME. TDelsa Grana PA-C Taking Active   Tiotropium Bromide-Olodaterol (STIOLTO RESPIMAT) 2.5-2.5 MCG/ACT AERS [id number] Inhale 2 puffs into the lungs daily. Parrett, TFonnie Mu NP Taking Active   Tiotropium Bromide-Olodaterol (STIOLTO RESPIMAT) 2.5-2.5 MCG/ACT AERS [id number] Inhale 2 puffs into the lungs daily. Parrett, TFonnie Mu NP Taking Active   Tiotropium Bromide-Olodaterol 2.5-2.5 MCG/ACT AERS [id number] Inhale 2 puffs into the lungs daily. 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 12/07/2017   Anemia of chronic disease 09/05/2017   Colon polyps 09/05/2017   Hypertension 07/14/2016   Hyperlipemia 07/14/2016   Bilateral lower extremity edema 07/14/2016   GERD (gastroesophageal reflux disease) 07/14/2016   Screening for colorectal cancer 07/14/2016    Immunization History  Administered Date(s) Administered   Fluad Quad(high Dose 65+) 09/20/2018, 10/16/2019, 10/16/2020   Influenza, High Dose Seasonal PF 10/16/2019   Influenza,inj,Quad PF,6+ Mos 10/18/2016, 09/29/2017   Pneumococcal Conjugate-13 10/16/2019   Pneumococcal Polysaccharide-23 03/26/2013    Conditions to be addressed/monitored:  Hypertension, Hyperlipidemia, Diabetes, GERD and COPD  Care Plan : General Pharmacy (Adult)  Updates made by FGermaine Pomfret RPH since 07/22/2021 12:00 AM     Problem: Hypertension, Hyperlipidemia, Diabetes, GERD and COPD   Priority: High     Long-Range Goal: Patient-Specific Goal   Start Date: 05/28/2020  Expected End Date: 10/17/2021  This Visit's Progress: On track  Recent Progress: On track  Priority: High  Note:   Current Barriers:  Unable to achieve control of COPD  Unable to  maintain control of blood pressure  Pharmacist Clinical Goal(s):  Patient will achieve control of COPD as evidenced by stable breathing per patient report maintain control of blood pressure as evidenced by BP less than 140/90  through collaboration with PharmD and provider.   Interventions: 1:1 collaboration with [name], PA-C regarding development and update of comprehensive plan of care as evidenced by provider attestation and co-signature Inter-disciplinary care team collaboration (see longitudinal plan of care) Comprehensive medication review performed; medication list updated in  electronic medical record  Hypertension (BP goal <140/90) -Uncontrolled -Current treatment: Furosmide 20 mg daily as needed Hydrochlorothiazide 25 mg daily  Valsartan 80 mg daily  -Medications previously tried: NA  -Current home readings: Has not been routinely monitoring -Denies hypotensive/hypertensive symptoms -Educated on Daily salt intake goal < 2300 mg; Importance of home blood pressure monitoring; -Counseled to monitor BP at home weekly, document, and provide log at future appointments  Hyperlipidemia: (LDL goal < 70) -Controlled -Current treatment: Rosuvastatin 20 mg daily  -Medications previously tried: NA  -Educated on Importance of limiting foods high in cholesterol; -Recommended to continue current medication  Diabetes (A1c goal <8%) -Uncontrolled per recent patient reported readings  -Current medications: NPH 40 units AM, 25 units nightly: Appropriate, Effective, Safe, Accessible -Medications previously tried: Metformin (worried it "destroys" her kidneys) -Current home glucose readings fasting glucose: "elevated"  post prandial glucose: NA -Denies hypoglycemic/hyperglycemic symptoms -Discussed that kidney damage is related to progression of diabetes, did not address resumption of metformin at this visit due to other patient concerns. Patient has follow-up with endocrinology later this month.   COPD (Goal: control symptoms and prevent exacerbations) -Uncontrolled -Current treatment  Albuterol nebulizer as needed: Appropriate, Effective, Safe, Accessible   Ventolin HFA 2 puffs every 6 hours as needed: Appropriate, Effective, Safe, Accessible  Spiriva: Query appropriate  -Medications previously tried: Trelegy (worsened cough)  -Patient was given samples and Rx for Stiolto, has only been using her Spiriva at home.  -Current COPD Classification:  B (high sx, <2 exacerbations/yr) - Possibly E if second exacerbation -MMRC score: 3 CAT ASSESSMENT  Rank each of the  following items on a scale of 0 to 5 (with 5 being most severe) Write a # 0-5 in each box  I never cough (0) > I cough all the time (5) 5 - Did not cough during our visit today  I have no phlegm (mucus) in my chest (0) > My chest is completely full of phlegm (mucus) (5) 4 - color is like "Vaseline"   My chest does not feel tight at all (0) > My chest feels very tight (5) 3  When I walk up a hill or one flight of stairs I am not breathless (0) > When I walk up a hill or one flight of stairs I am very breathless (5) 4  I am not limited doing any activities at home (0) > I am very limited doing activities at home (5) 3  I am confident leaving my home despite my lung function (0) > I am not at all confident leaving my home because of my lung condition (5)  2  I sleep soundly (0) > I don't sleep soundly because of my lung condition (5) 3  I have lots of energy (0) > I have no energy at all (5) 3  -Total CAT Score: 27 -Exacerbations requiring treatment in last 6 months: Yes 4/27, requiring abx and steroids  -Patient reports consistent use of maintenance inhaler -Frequency of rescue inhaler  use: multiple times daily if not using her nebulizer.  -Recommended patient stop Spiriva and begin Stiolto 2 puffs daily as directed by [employer], NP's last visit.  -Will reach out to pulmonology for assessment of potential COPD exacerbation and short course of antibiotics/steroids if needed.   Hypothyroidism (Goal: Maintain stable thyroid function) -Controlled -Current treatment  Levothyroxine 25 mcg daily  -Medications previously tried: NA  -Recommended to continue current medication  GERD (Goal: Prevent heartburn/reflux) -Controlled -Current treatment  Pantoprazole 40 mg twice daily  -Medications previously tried: NA -Patient with chronic cough of unclear etiology. Per patient reflux symptoms have been well controlled.    -Recommended to continue current medication  Patient Goals/Self-Care  Activities Patient will:  - check glucose twice daily; before breakfast and supper, document, and provide at future appointments check blood pressure three times weekly, document, and provide at future appointments  Follow Up Plan: Telephone follow up appointment with care management team member scheduled for:  09/02/21 at 1:00 PM    Medication Assistance:  Will determine if patient assistance necessary for inhaler treatments  Patient's preferred pharmacy is:  [facility] [place], [place] [address] Phone: [phone] Fax: [phone]  [place], [place] - [address] [place] [address] Phone: [phone] Fax: [phone]   Uses pill box? Yes Pt endorses 100% compliance  We discussed: Current pharmacy is preferred with insurance plan and patient is satisfied with pharmacy services Patient decided to: Continue current medication management strategy  Care Plan and Follow Up Patient Decision:  Patient agrees to Care Plan and Follow-up.  Plan: Telephone follow up appointment with care management team member scheduled for:  09/02/21 at 1:00 PM  Malva Limes, [place] Pharmacist Practitioner  [facility] [phone]

## 2021-07-29 ENCOUNTER — Ambulatory Visit: Payer: Medicare HMO | Admitting: Internal Medicine

## 2021-08-04 ENCOUNTER — Telehealth: Payer: Self-pay

## 2021-08-04 ENCOUNTER — Telehealth: Payer: Self-pay | Admitting: Emergency Medicine

## 2021-08-04 MED ORDER — STIOLTO RESPIMAT 2.5-2.5 MCG/ACT IN AERS: 2.0000 | INHALATION_SPRAY | Freq: Every day | RESPIRATORY_TRACT | 0 refills | Status: DC

## 2021-08-04 NOTE — Progress Notes (Addendum)
Chronic Care Management Pharmacy Assistant   Name: Tamara [name]  MRN: [id number] DOB: Mar 07, 1945  Reason for Encounter: COPD Assessment   I received a task from Junius Argyle, CPP requesting that I follow-up with the patient and complete a COPD Assessment. CPP had a follow-up with the patient on 07/05 and at that time she was having COPD symptoms. CPP made some recommendations and now is requesting I contact the patient to see how she is feeling.   CAT ASSESSMENT  Rank each of the following items on a scale of 0 to 5 (with 5 being most severe) Write a # 0-5 in each box  I never cough (0) > I cough all the time (5) 5  I have no phlegm (mucus) in my chest (0) > My chest is completely full of phlegm (mucus) (5) 4  My chest does not feel tight at all (0) > My chest feels very tight (5) 0  When I walk up a hill or one flight of stairs I am not breathless (0) > When I walk up a hill or one flight of stairs I am very breathless (5) 5  I am not limited doing any activities at home (0) > I am very limited doing activities at home (5) 5  I am confident leaving my home despite my lung function (0) > I am not at all confident leaving my home because of my lung condition (5)  2  I sleep soundly (0) > I don't sleep soundly because of my lung condition (5) 3  I have lots of energy (0) > I have no energy at all (5) 4   Total CAT Score: 28 High Impact  Patient stated that the Stiolto is helping her way more than the Spiriva did. Patient does advise she only has a few puffs of the Stiolto left. I informed her I would call her pharmacy to see if she has refills, and if she doesn't have refills I will call her pulmonary provider to see if they can send refills in for her.   Patient advised that she coughs at night and the coughing wakes her up. She advised that she is very limited in with activities as she does get shortness of breath really quickly. She stated that if she takes her inhaler prior to  going out she does pretty well as long as she is not walking very much or very far. Patient stated that it always feels like her chest is full of Phlegm and even when coughing it's hard for her to clear her chest. Per patient since having COPD her life has completely changed. Patient stated she does not have any COPD plan and no medications in case she has a flare up. Patient does no to call if she starts experiencing COPD exacerbation symptoms.   The pharmacy did not have an scripts on file for the patient's Stiolto, so I contacted Pulmonary and requested that the refills be sent in to her [facility]. I contacted patient with the update and advised if she doesn't receive a call stating she can pick up her prescription today she can give me a call in the morning so I can follow-up. Patient verbalized understanding.    Medications: Outpatient Encounter Medications as of 08/04/2021  Medication Sig   Accu-Chek Softclix Lancets lancets TEST BLOOD SUGAR TWICE DAILY   albuterol (PROVENTIL) (2.5 MG/3ML) 0.083% nebulizer solution Take 3 mLs (2.5 mg total) by nebulization every 6 (six) hours  as needed for wheezing or shortness of breath.   albuterol (VENTOLIN HFA) 108 (90 Base) MCG/ACT inhaler Inhale 2 puffs into the lungs every 6 (six) hours as needed for wheezing or shortness of breath.   Alcohol Swabs (B-D SINGLE USE SWABS REGULAR) PADS USE  TO TEST BLOOD SUGAR TWICE DAILY   Ascorbic Acid (VITAMIN C) 1000 MG tablet Take 1,000 mg by mouth daily.   benzonatate (TESSALON) 200 MG capsule Take 1 capsule (200 mg total) by mouth 3 (three) times daily as needed for cough.   Cholecalciferol (VITAMIN D) 50 MCG (2000 UT) CAPS Take 1,000 Units by mouth daily.   DROPLET PEN NEEDLES 32G X 4 MM MISC 1 Device by Other route daily.   fluconazole (DIFLUCAN) 150 MG tablet Take 1 tablet (150 mg total) by mouth daily.   furosemide (LASIX) 20 MG tablet Take 1 tablet (20 mg total) by mouth daily as needed for fluid  or edema.   hydrochlorothiazide (HYDRODIURIL) 25 MG tablet TAKE 1 TABLET EVERY DAY   Insulin NPH, Human,, Isophane, (NOVOLIN N FLEXPEN) 100 UNIT/ML Kiwkpen 40 units each morning, and 24 units each evening, and pen needles (61m x 32G) 1/day.   levothyroxine (SYNTHROID) 50 MCG tablet TAKE 1 TABLET DAILY BEFORE BREAKFAST.   meloxicam (MOBIC) 7.5 MG tablet Take 1 tablet (7.5 mg total) by mouth daily as needed for pain.   methocarbamol (ROBAXIN) 500 MG tablet TAKE 1 TABLET EVERY 8 HOURS AS NEEDED FOR MUSCLE SPASMS.   nystatin (MYCOSTATIN/NYSTOP) powder Apply 1 application. topically daily as needed (yeast under belly).   pantoprazole (PROTONIX) 40 MG tablet TAKE 1 TABLET TWICE DAILY   potassium chloride SA (KLOR-CON M) 20 MEQ tablet TAKE 1 TABLET EVERY DAY   rosuvastatin (CRESTOR) 20 MG tablet TAKE 1 TABLET (20 MG TOTAL) BY MOUTH AT BEDTIME.   Tiotropium Bromide-Olodaterol (STIOLTO RESPIMAT) 2.5-2.5 MCG/ACT AERS Inhale 2 puffs into the lungs daily.   Tiotropium Bromide-Olodaterol (STIOLTO RESPIMAT) 2.5-2.5 MCG/ACT AERS Inhale 2 puffs into the lungs daily.   Tiotropium Bromide-Olodaterol (STIOLTO RESPIMAT) 2.5-2.5 MCG/ACT AERS Inhale 2 puffs into the lungs daily.   Tiotropium Bromide-Olodaterol 2.5-2.5 MCG/ACT AERS Inhale 2 puffs into the lungs daily.   TRUE METRIX BLOOD GLUCOSE TEST test strip    valsartan (DIOVAN) 80 MG tablet Take 1 tablet (80 mg total) by mouth daily.   No facility-administered encounter medications on file as of 08/04/2021.   TLynann Bologna CPA/CMA Clinical Pharmacist Assistant Phone: [phone]  Addendum 08/05/21: Message sent to PCP to address if acute visit for potential COPD exacerbation is required.   AMalva Limes CMascottePharmacist Practitioner  [phone]

## 2021-08-04 NOTE — Telephone Encounter (Signed)
Called and spoke with patient. Patient verified her pharmacy. Refill of stiolto was sent to her pharmacy.   Nothing further needed.

## 2021-08-06 ENCOUNTER — Ambulatory Visit (HOSPITAL_COMMUNITY)
Admission: RE | Admit: 2021-08-06 | Discharge: 2021-08-06 | Disposition: A | Discharge status: Home / Self Care | Payer: Medicare HMO | Source: Ambulatory Visit | Attending: Emergency Medicine | Admitting: Emergency Medicine

## 2021-08-06 DIAGNOSIS — Z87891 Personal history of nicotine dependence: Secondary | ICD-10-CM | POA: Diagnosis not present

## 2021-08-06 DIAGNOSIS — J439 Emphysema, unspecified: Secondary | ICD-10-CM | POA: Insufficient documentation

## 2021-08-06 DIAGNOSIS — R059 Cough, unspecified: Secondary | ICD-10-CM | POA: Diagnosis not present

## 2021-08-06 DIAGNOSIS — J984 Other disorders of lung: Secondary | ICD-10-CM | POA: Insufficient documentation

## 2021-08-06 DIAGNOSIS — J90 Pleural effusion, not elsewhere classified: Secondary | ICD-10-CM | POA: Diagnosis not present

## 2021-08-06 DIAGNOSIS — I700 Atherosclerosis of aorta: Secondary | ICD-10-CM | POA: Insufficient documentation

## 2021-08-06 DIAGNOSIS — R0602 Shortness of breath: Secondary | ICD-10-CM | POA: Insufficient documentation

## 2021-08-06 DIAGNOSIS — K7460 Unspecified cirrhosis of liver: Secondary | ICD-10-CM | POA: Diagnosis not present

## 2021-08-06 DIAGNOSIS — R918 Other nonspecific abnormal finding of lung field: Secondary | ICD-10-CM | POA: Diagnosis not present

## 2021-08-10 ENCOUNTER — Telehealth: Payer: Self-pay | Admitting: Emergency Medicine

## 2021-08-10 MED ORDER — STIOLTO RESPIMAT 2.5-2.5 MCG/ACT IN AERS: 2.0000 | INHALATION_SPRAY | Freq: Every day | RESPIRATORY_TRACT | 5 refills | Status: DC

## 2021-08-10 NOTE — Telephone Encounter (Signed)
Called patient and advised her that I was sending her inhaler into her [place]. Nothing further needed.

## 2021-08-11 ENCOUNTER — Encounter: Payer: Self-pay | Admitting: Internal Medicine

## 2021-08-11 ENCOUNTER — Ambulatory Visit: Payer: Medicare HMO | Admitting: Internal Medicine

## 2021-08-11 VITALS — BP 118/80 | HR 94 | Ht 66.0 in | Wt 240.4 lb

## 2021-08-11 DIAGNOSIS — E1165 Type 2 diabetes mellitus with hyperglycemia: Secondary | ICD-10-CM | POA: Diagnosis not present

## 2021-08-11 DIAGNOSIS — E785 Hyperlipidemia, unspecified: Secondary | ICD-10-CM

## 2021-08-11 DIAGNOSIS — E1159 Type 2 diabetes mellitus with other circulatory complications: Secondary | ICD-10-CM | POA: Diagnosis not present

## 2021-08-11 LAB — POCT GLYCOSYLATED HEMOGLOBIN (HGB A1C): Hemoglobin A1C: 10.1 % — AB (ref 4.0–5.6)

## 2021-08-11 MED ORDER — METFORMIN HCL 1000 MG PO TABS: 1000.0000 mg | ORAL_TABLET | Freq: Two times a day (BID) | ORAL | 3 refills | Status: DC

## 2021-08-11 MED ORDER — DAPAGLIFLOZIN PROPANEDIOL 5 MG PO TABS: 5.0000 mg | ORAL_TABLET | Freq: Every day | ORAL | 3 refills | Status: DC

## 2021-08-11 MED ORDER — TRUE METRIX BLOOD GLUCOSE TEST VI STRP: ORAL_STRIP | 3 refills | Status: DC

## 2021-08-11 MED ORDER — FLUCONAZOLE 150 MG PO TABS: 150.0000 mg | ORAL_TABLET | Freq: Once | ORAL | 1 refills | Status: AC

## 2021-08-11 MED ORDER — BD SWAB SINGLE USE REGULAR PADS
MEDICATED_PAD | 3 refills | Status: DC
Start: 2021-08-11 — End: 2022-06-27

## 2021-08-11 MED ORDER — DROPLET PEN NEEDLES 32G X 4 MM MISC
1.0000 | Freq: Every day | 3 refills | Status: DC
Start: 2021-08-11 — End: 2022-06-10

## 2021-08-11 MED ORDER — NOVOLIN N FLEXPEN 100 UNIT/ML ~~LOC~~ SUPN: PEN_INJECTOR | SUBCUTANEOUS | 3 refills | Status: DC
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
## 2021-08-11 NOTE — Patient Instructions (Addendum)
Please continue: - NPH 40 units in am and 24 units at bedtime - Metformin 1000 mg 2x a day  Try to start: - Farxiga 5 mg before b'fast  Please return in 3 months with your sugar log.   PATIENT INSTRUCTIONS FOR TYPE 2 DIABETES:  **Please join MyChart!** - see attached instructions about how to join if you have not done so already.  DIET AND EXERCISE Diet and exercise is an important part of diabetic treatment.  We recommended aerobic exercise in the form of brisk walking (working between 40-60% of maximal aerobic capacity, similar to brisk walking) for 150 minutes per week (such as 30 minutes five days per week) along with 3 times per week performing 'resistance' training (using various gauge rubber tubes with handles) 5-10 exercises involving the major muscle groups (upper body, lower body and core) performing 10-15 repetitions (or near fatigue) each exercise. Start at half the above goal but build slowly to reach the above goals. If limited by weight, joint pain, or disability, we recommend daily walking in a swimming pool with water up to waist to reduce pressure from joints while allow for adequate exercise.    BLOOD GLUCOSES Monitoring your blood glucoses is important for continued management of your diabetes. Please check your blood glucoses 2-4 times a day: fasting, before meals and at bedtime (you can rotate these measurements - e.g. one day check before the 3 meals, the next day check before 2 of the meals and before bedtime, etc.).   HYPOGLYCEMIA (low blood sugar) Hypoglycemia is usually a reaction to not eating, exercising, or taking too much insulin/ other diabetes drugs.  Symptoms include tremors, sweating, hunger, confusion, headache, etc. Treat IMMEDIATELY with 15 grams of Carbs: 4 glucose tablets  cup regular juice/soda 2 tablespoons raisins 4 teaspoons sugar 1 tablespoon honey Recheck blood glucose in 15 mins and repeat above if still symptomatic/blood glucose  <100.  RECOMMENDATIONS TO REDUCE YOUR RISK OF DIABETIC COMPLICATIONS: * Take your prescribed MEDICATION(S) * Follow a DIABETIC diet: Complex carbs, fiber rich foods, (monounsaturated and polyunsaturated) fats * AVOID saturated/trans fats, high fat foods, >2,300 mg salt per day. * EXERCISE at least 5 times a week for 30 minutes or preferably daily.  * DO NOT SMOKE OR DRINK more than 1 drink a day. * Check your FEET every day. Do not wear tightfitting shoes. Contact us if you develop an ulcer * See your EYE doctor once a year or more if needed * Get a FLU shot once a year * Get a PNEUMONIA vaccine once before and once after age 63 years  GOALS:  * Your Hemoglobin A1c of <7%  * fasting sugars need to be <130 * after meals sugars need to be <180 (2h after you start eating) * Your Systolic BP should be 106 or lower  * Your Diastolic BP should be 80 or lower  * Your HDL (Good Cholesterol) should be 40 or higher  * Your LDL (Bad Cholesterol) should be 100 or lower. * Your Triglycerides should be 150 or lower  * Your Urine microalbumin (kidney function) should be <30 * Your Body Mass Index should be 25 or lower    Please consider the following ways to cut down carbs and fat and increase fiber and micronutrients in your diet: - substitute whole grain for white bread or pasta - substitute brown rice for white rice - substitute 90-calorie flat bread pieces for slices of bread when possible - substitute sweet potatoes or yams  for white potatoes - substitute humus for margarine - substitute tofu for cheese when possible - substitute almond or rice milk for regular milk (would not drink soy milk daily due to concern for soy estrogen influence on breast cancer risk) - substitute dark chocolate for other sweets when possible - substitute water - can add lemon or orange slices for taste - for diet sodas (artificial sweeteners will trick your body that you can eat sweets without getting calories and  will lead you to overeating and weight gain in the long run) - do not skip breakfast or other meals (this will slow down the metabolism and will result in more weight gain over time)  - can try smoothies made from fruit and almond/rice milk in am instead of regular breakfast - can also try old-fashioned (not instant) oatmeal made with almond/rice milk in am - order the dressing on the side when eating salad at a restaurant (pour less than half of the dressing on the salad) - eat as little meat as possible - can try juicing, but should not forget that juicing will get rid of the fiber, so would alternate with eating raw veg./fruits or drinking smoothies - use as little oil as possible, even when using olive oil - can dress a salad with a mix of balsamic vinegar and lemon juice, for e.g. - use agave nectar, stevia sugar, or regular sugar rather than artificial sweateners - steam or broil/roast veggies  - snack on veggies/fruit/nuts (unsalted, preferably) when possible, rather than processed foods - reduce or eliminate aspartame in diet (it is in diet sodas, chewing gum, etc) Read the labels!  Try to read Dr. Janene Harvey book: "Program for Reversing Diabetes" for other ideas for healthy eating.

## 2021-08-18 ENCOUNTER — Ambulatory Visit (INDEPENDENT_AMBULATORY_CARE_PROVIDER_SITE_OTHER): Payer: Medicare HMO | Admitting: Emergency Medicine

## 2021-08-18 ENCOUNTER — Ambulatory Visit: Payer: Medicare HMO | Admitting: Emergency Medicine

## 2021-08-18 ENCOUNTER — Encounter: Payer: Self-pay | Admitting: Emergency Medicine

## 2021-08-18 VITALS — BP 138/78 | HR 89 | Temp 98.0°F | Ht 65.5 in | Wt 243.4 lb

## 2021-08-18 DIAGNOSIS — R918 Other nonspecific abnormal finding of lung field: Secondary | ICD-10-CM | POA: Diagnosis not present

## 2021-08-18 DIAGNOSIS — J449 Chronic obstructive pulmonary disease, unspecified: Secondary | ICD-10-CM

## 2021-08-18 DIAGNOSIS — J984 Other disorders of lung: Secondary | ICD-10-CM

## 2021-08-18 LAB — PULMONARY FUNCTION TEST
DL/VA % pred: 102 %
DL/VA: 4.16 ml/min/mmHg/L
DLCO cor % pred: 80 %
DLCO cor: 16.16 ml/min/mmHg
DLCO unc % pred: 72 %
DLCO unc: 14.64 ml/min/mmHg
FEF 25-75 Post: 2.5 L/sec
FEF 25-75 Pre: 2.1 L/sec
FEF2575-%Change-Post: 18 %
FEF2575-%Pred-Post: 139 %
FEF2575-%Pred-Pre: 117 %
FEV1-%Change-Post: 1 %
FEV1-%Pred-Post: 84 %
FEV1-%Pred-Pre: 83 %
FEV1-Post: 1.94 L
FEV1-Pre: 1.91 L
FEV1FVC-%Change-Post: 1 %
FEV1FVC-%Pred-Pre: 112 %
FEV6-%Change-Post: 0 %
FEV6-%Pred-Post: 78 %
FEV6-%Pred-Pre: 78 %
FEV6-Post: 2.28 L
FEV6-Pre: 2.27 L
FEV6FVC-%Pred-Post: 104 %
FEV6FVC-%Pred-Pre: 104 %
FVC-%Change-Post: 0 %
FVC-%Pred-Post: 75 %
FVC-%Pred-Pre: 74 %
FVC-Post: 2.28 L
FVC-Pre: 2.27 L
Post FEV1/FVC ratio: 85 %
Post FEV6/FVC ratio: 100 %
Pre FEV1/FVC ratio: 84 %
Pre FEV6/FVC Ratio: 100 %
RV % pred: 78 %
RV: 1.84 L
TLC % pred: 79 %
TLC: 4.19 L

## 2021-08-18 MED ORDER — STIOLTO RESPIMAT 2.5-2.5 MCG/ACT IN AERS: 2.0000 | INHALATION_SPRAY | Freq: Every day | RESPIRATORY_TRACT | 5 refills | Status: DC

## 2021-08-18 NOTE — Progress Notes (Signed)
PFT done today. 
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
## 2021-08-18 NOTE — Patient Instructions (Addendum)
We reviewed your pulmonary function testing today. Please continue Stiolto 2 puffs once daily. Keep your albuterol available to use 2 puffs up to every 4 hours if needed for shortness of breath, chest tightness, wheezing. We reviewed your CT scan of the chest.  This is stable.  Good news.  We will plan to repeat your CT chest without contrast in July 2024. Please follow Dr. Lamonte Sakai in 6 months or sooner if you have any problems.

## 2021-08-25 NOTE — Progress Notes (Unsigned)
Established Patient Office Visit  Subjective:  Patient ID: Tamara [name], female    DOB: 26-Jul-1945  Age: 76 y.o. MRN: [id number]  CC:  No chief complaint on file.   HPI DOTTY GONZALO presents for follow up on chronic medical conditions.   Hypertension: -Medications: Valsartan 80, HCTZ 25, Lasix 20 PRN for swelling (rarely has to take) -Patient is compliant with above medications and reports no side effects. -Checking BP at home (average): doesn't check  -Denies any SOB, CP, vision changes, LE edema or symptoms of hypotension  Diabetes, Type 2: -Following with Endocrinology, last saw in July  -Last A1c 7/23 10.1 -Medications: Metformin 1000 BID, NPH 40 units -Patient is compliant with the above medications and reports no side effects.  -Eye exam: will schedule  -Foot exam: up to date -Statin: yes -PNA vaccine: yes -Denies symptoms of hypoglycemia, polyuria, polydipsia, numbness extremities, foot ulcers/trauma.   HLD: -Medications: Crestor 20 mg - hesitant to increase statin  -Patient is compliant with above medication. She does have tolerable joint pains with the medication and would prefer not to increase it. -Last lipid panel: 7/22: TC 181, HDL 54, triglycerides 176, LDL 100  The 10-year ASCVD risk score (Arnett DK, et al., 2019) is: 37.5%   Values used to calculate the score:     Age: 33 years     Sex: Female     Is Non-Hispanic African American: No     Diabetic: Yes     Tobacco smoker: No     Systolic Blood Pressure: 138 mmHg     Is BP treated: Yes     HDL Cholesterol: 54 mg/dL     Total Cholesterol: 181 mg/dL  Hypothyroidism: -Following with Endocrinology  -Medications: Levothyroxine 50 mcg -Patient is compliant with the above medication (s) at the above dose and reports no medication side effects.  -Denies weight changes, cold./heat intolerance, skin changes, anxiety/palpitations  -Last TSH: 7/22 3.88  COPD: -COPD status: controlled -Current  medications: Albuterol PRN, not using lately  -Satisfied with current treatment?: yes -Oxygen use: no -Dyspnea frequency: with exertion -Cough frequency: no -Rescue inhaler frequency: rare -Limitation of activity: yes -Productive cough: no -Pneumovax: Up to Date -Influenza: Up to Date -Seeing Pulmonology every 3 months, planning on repeating CT scan the end of the month due to severe scarring from COVID pneumonia in 2020.  GERD: -Currently on Protonix 40 mg   Health Maintenance: -Blood work due  -Mammogram 2/20: [place] 1 -Colonoscopy 2019: repeat in 5 years   Past Medical History:  Diagnosis Date   Arthritis    knee and shoulders   Diabetes mellitus without complication (HCC)    type II   Dyspnea    05/16/20 has had a cough for 2.5 years post Covid   Gastritis 09/05/2017   GERD (gastroesophageal reflux disease)    Hyperlipidemia    Hypertension    patient denies   Hypothyroidism    Panic attack    RUQ pain 03/09/2017   Per patient, she has had RUQ pain 4-5 years that has grown worse in the last few months.    Past Surgical History:  Procedure Laterality Date   BREAST BIOPSY Left 2008   CORE W/CLIP - NEG   BRONCHIAL BIOPSY  05/19/2020   Procedure: BRONCHIAL BIOPSIES;  Surgeon: Leslye Peer, MD;  Location: [facility] ENDOSCOPY;  Service: Pulmonary;;   BRONCHIAL BRUSHINGS  05/19/2020   Procedure: BRONCHIAL BRUSHINGS;  Surgeon: Leslye Peer, MD;  Location: [facility] ENDOSCOPY;  Service: Pulmonary;;   BRONCHIAL NEEDLE ASPIRATION BIOPSY  05/19/2020   Procedure: BRONCHIAL NEEDLE ASPIRATION BIOPSIES;  Surgeon: Collene Gobble, MD;  Location: [facility];  Service: Pulmonary;;   BRONCHIAL WASHINGS  05/19/2020   Procedure: BRONCHIAL WASHINGS;  Surgeon: Collene Gobble, MD;  Location[facility] ENDOSCOPY;  Service: Pulmonary;;   COLONOSCOPY WITH PROPOFOL N/A 02/08/2017   Procedure: COLONOSCOPY WITH PROPOFOL;  Surgeon: Lollie Sails, MD;  Location[facility]COPY;  Service: Endoscopy;   Laterality: N/A;   ESOPHAGOGASTRODUODENOSCOPY (EGD) WITH PROPOFOL N/A 02/08/2017   Procedure: ESOPHAGOGASTRODUODENOSCOPY (EGD) WITH PROPOFOL;  Surgeon: Lollie Sails, MD;  Location: [facility] ENDOSCOPY;  Service: Endoscopy;  Laterality: N/A;   TUBAL LIGATION     VIDEO BRONCHOSCOPY WITH ENDOBRONCHIAL NAVIGATION N/A 05/19/2020   Procedure: VIDEO BRONCHOSCOPY WITH ENDOBRONCHIAL NAVIGATION;  Surgeon: Collene Gobble, MD;  Location: [place] ENDOSCOPY;  Service: Pulmonary;  Laterality: N/A;    Family History  Problem Relation Age of Onset   Diabetes Maternal Grandmother    Breast cancer Neg Hx     Social History   Socioeconomic History   Marital status: Married    Spouse name: Not on file   Number of children: 3   Years of education: Not on file   Highest education level: High school graduate  Occupational History   Occupation: retired  Tobacco Use   Smoking status: Former    Packs/day: 1.00    Years: 40.00    Total pack years: 40.00    Types: Cigarettes    Quit date: 2006    Years since quitting: 17.6   Smokeless tobacco: Never   Tobacco comments:    Smoking cessation materials not required  Vaping Use   Vaping Use: Never used  Substance and Sexual Activity   Alcohol use: No   Drug use: No   Sexual activity: Not on file  Other Topics Concern   Not on file  Social History Narrative   Not on file   Social Determinants of Health   Financial Resource Strain: Medium Risk (02/02/2021)   Overall Financial Resource Strain (CARDIA)    Difficulty of Paying Living Expenses: Somewhat hard  Food Insecurity: No Food Insecurity (10/16/2020)   Hunger Vital Sign    Worried About Running Out of Food in the Last Year: Never true    [place] in the Last Year: Never true  Transportation Needs: No Transportation Needs (10/16/2020)   PRAPARE - Hydrologist (Medical): No    Lack of Transportation (Non-Medical): No  Physical Activity: Inactive (10/16/2020)    Exercise Vital Sign    Days of Exercise per Week: 0 days    Minutes of Exercise per Session: 0 min  Stress: No Stress Concern Present (10/16/2020)   [place]    Feeling of Stress : Not at all  Social Connections: Moderately Isolated (10/16/2020)   Social Connection and Isolation Panel [NHANES]    Frequency of Communication with Friends and Family: More than three times a week    Frequency of Social Gatherings with Friends and Family: More than three times a week    Attends Religious Services: Never    Marine scientist or Organizations: No    Attends Archivist Meetings: Never    Marital Status: Married  Human resources officer Violence: Not At Risk (10/16/2020)   Humiliation, Afraid, Rape, and Kick questionnaire    Fear of Current or Ex-Partner: No  Emotionally Abused: No    Physically Abused: No    Sexually Abused: No    Outpatient Medications Prior to Visit  Medication Sig Dispense Refill   Accu-Chek Softclix Lancets lancets TEST BLOOD SUGAR TWICE DAILY 200 each 11   albuterol (PROVENTIL) (2.5 MG/3ML) 0.083% nebulizer solution Take 3 mLs (2.5 mg total) by nebulization every 6 (six) hours as needed for wheezing or shortness of breath. 75 mL 5   albuterol (VENTOLIN HFA) 108 (90 Base) MCG/ACT inhaler Inhale 2 puffs into the lungs every 6 (six) hours as needed for wheezing or shortness of breath. 8 g 6   Alcohol Swabs (B-D SINGLE USE SWABS REGULAR) PADS USE  TO TEST BLOOD SUGAR TWICE DAILY 200 each 3   Ascorbic Acid (VITAMIN C) 1000 MG tablet Take 1,000 mg by mouth daily.     benzonatate (TESSALON) 200 MG capsule Take 1 capsule (200 mg total) by mouth 3 (three) times daily as needed for cough. (Patient not taking: Reported on 08/18/2021) 30 capsule 1   Cholecalciferol (VITAMIN D) 50 MCG (2000 UT) CAPS Take 1,000 Units by mouth daily.     dapagliflozin propanediol (FARXIGA) 5 MG TABS tablet Take 1 tablet (5 mg  total) by mouth daily. 90 tablet 3   DROPLET PEN NEEDLES 32G X 4 MM MISC 1 Device by Other route daily. 200 each 3   fluconazole (DIFLUCAN) 150 MG tablet Take 1 tablet (150 mg total) by mouth daily. 1 tablet 0   furosemide (LASIX) 20 MG tablet Take 1 tablet (20 mg total) by mouth daily as needed for fluid or edema. 30 tablet 1   hydrochlorothiazide (HYDRODIURIL) 25 MG tablet TAKE 1 TABLET EVERY DAY 90 tablet 0   Insulin NPH, Human,, Isophane, (NOVOLIN N FLEXPEN) 100 UNIT/ML Kiwkpen Inject under skin 40 units each morning, and 24 units each evening 60 mL 3   levothyroxine (SYNTHROID) 50 MCG tablet TAKE 1 TABLET DAILY BEFORE BREAKFAST. 90 tablet 1   meloxicam (MOBIC) 7.5 MG tablet Take 1 tablet (7.5 mg total) by mouth daily as needed for pain. 30 tablet 2   metFORMIN (GLUCOPHAGE) 1000 MG tablet Take 1 tablet (1,000 mg total) by mouth 2 (two) times daily. 180 tablet 3   methocarbamol (ROBAXIN) 500 MG tablet TAKE 1 TABLET EVERY 8 HOURS AS NEEDED FOR MUSCLE SPASMS. 90 tablet 3   nystatin (MYCOSTATIN/NYSTOP) powder Apply 1 application. topically daily as needed (yeast under belly). 60 g 2   pantoprazole (PROTONIX) 40 MG tablet TAKE 1 TABLET TWICE DAILY (Patient taking differently: 40 mg daily.) 180 tablet 1   potassium chloride SA (KLOR-CON M) 20 MEQ tablet TAKE 1 TABLET EVERY DAY 90 tablet 0   rosuvastatin (CRESTOR) 20 MG tablet TAKE 1 TABLET (20 MG TOTAL) BY MOUTH AT BEDTIME. 90 tablet 3   Tiotropium Bromide-Olodaterol (STIOLTO RESPIMAT) 2.5-2.5 MCG/ACT AERS Inhale 2 puffs into the lungs daily. 4 g 5   Tiotropium Bromide-Olodaterol 2.5-2.5 MCG/ACT AERS Inhale 2 puffs into the lungs daily. 4 g 6   TRUE METRIX BLOOD GLUCOSE TEST test strip Use 2x a day 200 each 3   valsartan (DIOVAN) 80 MG tablet Take 1 tablet (80 mg total) by mouth daily. 90 tablet 3   No facility-administered medications prior to visit.    Allergies  Allergen Reactions   Penicillins Hives    Reaction: 1965    ROS Review of  Systems  Constitutional:  Negative for chills and fever.  Eyes:  Negative for visual disturbance.  Respiratory:  Negative for cough and shortness of breath.   Cardiovascular:  Negative for chest pain.  Gastrointestinal:  Negative for abdominal pain.  Neurological:  Negative for dizziness and headaches.      Objective:    Physical Exam Constitutional:      Appearance: Normal appearance.  HENT:     Head: Normocephalic and atraumatic.  Eyes:     Conjunctiva/sclera: Conjunctivae normal.  Cardiovascular:     Rate and Rhythm: Normal rate and regular rhythm.  Pulmonary:     Effort: Pulmonary effort is normal.     Breath sounds: Normal breath sounds.  Musculoskeletal:     Right lower leg: No edema.     Left lower leg: No edema.  Skin:    General: Skin is warm and dry.     Comments: Small skin tag on back of neck, would like removed  Neurological:     General: No focal deficit present.     Mental Status: She is alert. Mental status is at baseline.  Psychiatric:        Mood and Affect: Mood normal.        Behavior: Behavior normal.     There were no vitals taken for this visit. Wt Readings from Last 3 Encounters:  08/18/21 243 lb 6.4 oz (110.4 kg)  08/11/21 240 lb 6.4 oz (109 kg)  06/08/21 240 lb (108.9 kg)     Health Maintenance Due  Topic Date Due   COVID-19 Vaccine (1) Never done   Zoster Vaccines- Shingrix (1 of 2) Never done   OPHTHALMOLOGY EXAM  01/24/2019   MAMMOGRAM  03/08/2019   INFLUENZA VACCINE  08/18/2021    There are no preventive care reminders to display for this patient.  Lab Results  Component Value Date   TSH 3.88 07/28/2020   Lab Results  Component Value Date   WBC 6.4 07/28/2020   HGB 10.7 (L) 07/28/2020   HCT 34.5 (L) 07/28/2020   MCV 85.6 07/28/2020   PLT 154 07/28/2020   Lab Results  Component Value Date   NA 138 07/28/2020   K 4.8 07/28/2020   CO2 28 07/28/2020   GLUCOSE 197 (H) 07/28/2020   BUN 15 07/28/2020   CREATININE 0.97  07/28/2020   BILITOT 0.5 07/28/2020   ALKPHOS 47 08/23/2016   AST 27 07/28/2020   ALT 20 07/28/2020   PROT 6.9 07/28/2020   ALBUMIN 4.4 08/23/2016   CALCIUM 11.0 (H) 07/28/2020   EGFR 62 07/28/2020   GFR 59.43 (L) 05/13/2020   Lab Results  Component Value Date   CHOL 181 07/28/2020   Lab Results  Component Value Date   HDL 54 07/28/2020   Lab Results  Component Value Date   LDLCALC 100 (H) 07/28/2020   Lab Results  Component Value Date   TRIG 176 (H) 07/28/2020   Lab Results  Component Value Date   CHOLHDL 3.4 07/28/2020   Lab Results  Component Value Date   HGBA1C 10.1 (A) 08/11/2021      Assessment & Plan:   1. Primary hypertension: BP at goal, continue current medications, labs reviewed. Follow up in 6 months.   2. Hyperlipidemia, unspecified hyperlipidemia type: Stable, reviewed last lipid panel and ASCVD risk, which is increased. She is hesitant to increase statin, will keep the same for now and continue to modify risk factors with lifestyle management.   3. Uncontrolled type 2 diabetes mellitus with hyperglycemia ([place]): Stable, following with Endo.  4. Thyroid disease: Stable, following with Endo.  5. Chronic obstructive pulmonary disease, unspecified COPD type ([place]): Stable, following with Pulmonology for repeat CT due to lung scarring.  6. Gastroesophageal reflux disease without esophagitis: Stable, continue PPI.  7. Encounter for screening mammogram for malignant neoplasm of breast: Mammogram ordered today.  - MM 3D SCREEN BREAST BILATERAL; Future  Follow-up: No follow-ups on file.    Teodora Medici, DO

## 2021-08-26 ENCOUNTER — Ambulatory Visit (INDEPENDENT_AMBULATORY_CARE_PROVIDER_SITE_OTHER): Payer: Medicare HMO | Admitting: Internal Medicine

## 2021-08-26 ENCOUNTER — Encounter: Payer: Self-pay | Admitting: Internal Medicine

## 2021-08-26 VITALS — BP 134/68 | HR 93 | Temp 97.8°F | Resp 16 | Ht 66.0 in | Wt 240.0 lb

## 2021-08-26 DIAGNOSIS — E119 Type 2 diabetes mellitus without complications: Secondary | ICD-10-CM

## 2021-08-26 DIAGNOSIS — M25512 Pain in left shoulder: Secondary | ICD-10-CM

## 2021-08-26 DIAGNOSIS — E079 Disorder of thyroid, unspecified: Secondary | ICD-10-CM

## 2021-08-26 DIAGNOSIS — R600 Localized edema: Secondary | ICD-10-CM

## 2021-08-26 DIAGNOSIS — E785 Hyperlipidemia, unspecified: Secondary | ICD-10-CM

## 2021-08-26 DIAGNOSIS — K219 Gastro-esophageal reflux disease without esophagitis: Secondary | ICD-10-CM

## 2021-08-26 DIAGNOSIS — J439 Emphysema, unspecified: Secondary | ICD-10-CM | POA: Diagnosis not present

## 2021-08-26 DIAGNOSIS — I10 Essential (primary) hypertension: Secondary | ICD-10-CM | POA: Diagnosis not present

## 2021-08-26 DIAGNOSIS — M542 Cervicalgia: Secondary | ICD-10-CM | POA: Diagnosis not present

## 2021-08-26 MED ORDER — MELOXICAM 15 MG PO TABS: 15.0000 mg | ORAL_TABLET | Freq: Every day | ORAL | 0 refills | Status: DC

## 2021-08-26 MED ORDER — HYDROCHLOROTHIAZIDE 25 MG PO TABS: 25.0000 mg | ORAL_TABLET | Freq: Every day | ORAL | 1 refills | Status: DC

## 2021-08-26 MED ORDER — ROSUVASTATIN CALCIUM 20 MG PO TABS: 20.0000 mg | ORAL_TABLET | Freq: Every day | ORAL | 1 refills | Status: DC

## 2021-08-26 MED ORDER — POTASSIUM CHLORIDE CRYS ER 20 MEQ PO TBCR: 20.0000 meq | EXTENDED_RELEASE_TABLET | Freq: Every day | ORAL | 1 refills | Status: DC

## 2021-08-26 MED ORDER — FUROSEMIDE 40 MG PO TABS: 40.0000 mg | ORAL_TABLET | Freq: Every day | ORAL | 1 refills | Status: DC

## 2021-08-26 NOTE — Patient Instructions (Addendum)
It was great seeing you today!  Plan discussed at today's visit: -Blood work ordered today, results will be uploaded to [place].  -Medications refilled -Lasix increased to 40 mg, take daily for 3 days to help swelling then as needed -Mobic increased to 15 mg for pain, do NOT take with other anti-inflammatories and take it with foods -Referral to Orthopedic placed today -Referral to Pharmacy to help with [place] payment placed -Call to schedule mammogram please  Follow up in: 6 months   Take care and let us know if you have any questions or concerns prior to your next visit.  Dr. Rosana Berger

## 2021-08-27 LAB — LIPID PANEL
Cholesterol: 185 mg/dL (ref <200)
HDL: 43 mg/dL — ABNORMAL LOW (ref >=50)
LDL Cholesterol (Calc): 112 mg/dL (calc) — ABNORMAL HIGH
Non-HDL Cholesterol (Calc): 142 mg/dL (calc) — ABNORMAL HIGH (ref <130)
Total CHOL/HDL Ratio: 4.3 (calc) (ref <5.0)
Triglycerides: 182 mg/dL — ABNORMAL HIGH (ref <150)

## 2021-08-27 LAB — CBC WITH DIFFERENTIAL/PLATELET
Absolute Monocytes: 336 cells/uL (ref 200–950)
Basophils Absolute: 29 cells/uL (ref 0–200)
Basophils Relative: 0.5 %
Eosinophils Absolute: 490 cells/uL (ref 15–500)
Eosinophils Relative: 8.6 %
HCT: 33.2 % — ABNORMAL LOW (ref 35.0–45.0)
Hemoglobin: 10.9 g/dL — ABNORMAL LOW (ref 11.7–15.5)
Lymphs Abs: 1265 cells/uL (ref 850–3900)
MCH: 26.8 pg — ABNORMAL LOW (ref 27.0–33.0)
MCHC: 32.8 g/dL (ref 32.0–36.0)
MCV: 81.8 fL (ref 80.0–100.0)
MPV: 10.6 fL (ref 7.5–12.5)
Monocytes Relative: 5.9 %
Neutro Abs: 3580 cells/uL (ref 1500–7800)
Neutrophils Relative %: 62.8 %
Platelets: 164 10*3/uL (ref 140–400)
RBC: 4.06 10*6/uL (ref 3.80–5.10)
RDW: 13.9 % (ref 11.0–15.0)
Total Lymphocyte: 22.2 %
WBC: 5.7 10*3/uL (ref 3.8–10.8)

## 2021-08-27 LAB — COMPLETE METABOLIC PANEL WITH GFR
AG Ratio: 1.8 (calc) (ref 1.0–2.5)
ALT: 14 U/L (ref 6–29)
AST: 22 U/L (ref 10–35)
Albumin: 4.4 g/dL (ref 3.6–5.1)
Alkaline phosphatase (APISO): 62 U/L (ref 37–153)
BUN/Creatinine Ratio: 17 (calc) (ref 6–22)
BUN: 17 mg/dL (ref 7–25)
CO2: 27 mmol/L (ref 20–32)
Calcium: 10.2 mg/dL (ref 8.6–10.4)
Chloride: 104 mmol/L (ref 98–110)
Creat: 1.01 mg/dL — ABNORMAL HIGH (ref 0.60–1.00)
Globulin: 2.5 g/dL (calc) (ref 1.9–3.7)
Glucose, Bld: 121 mg/dL — ABNORMAL HIGH (ref 65–99)
Potassium: 4.6 mmol/L (ref 3.5–5.3)
Sodium: 141 mmol/L (ref 135–146)
Total Bilirubin: 0.5 mg/dL (ref 0.2–1.2)
Total Protein: 6.9 g/dL (ref 6.1–8.1)
eGFR: 58 mL/min/{1.73_m2} — ABNORMAL LOW (ref >=60)

## 2021-08-27 LAB — TSH: TSH: 3.34 mIU/L (ref 0.40–4.50)

## 2021-08-28 DIAGNOSIS — M542 Cervicalgia: Secondary | ICD-10-CM | POA: Diagnosis not present

## 2021-08-28 MED ORDER — LEVOTHYROXINE SODIUM 50 MCG PO TABS: 50.0000 ug | ORAL_TABLET | Freq: Every day | ORAL | 1 refills | Status: DC

## 2021-08-28 NOTE — Addendum Note (Signed)
Addended by: Teodora Medici on: 08/28/2021 08:50 AM   Modules accepted: Orders

## 2021-08-31 ENCOUNTER — Other Ambulatory Visit: Payer: Self-pay | Admitting: Otolaryngology

## 2021-08-31 ENCOUNTER — Other Ambulatory Visit (HOSPITAL_COMMUNITY): Payer: Self-pay | Admitting: Otolaryngology

## 2021-08-31 DIAGNOSIS — M542 Cervicalgia: Secondary | ICD-10-CM

## 2021-09-01 ENCOUNTER — Telehealth: Payer: Self-pay

## 2021-09-01 NOTE — Progress Notes (Signed)
    Chronic Care Management Pharmacy Assistant   Name: TALETHA TWIFORD  MRN: [id number] DOB: 1945-08-17  Patient called to be reminded of her telephone appointment with Junius Argyle, CPP on 09/02/2021 @ 1300  Patient aware of appointment date, time, and type of appointment (either telephone or in person). Patient aware to have/bring all medications, supplements, blood pressure and/or blood sugar logs to visit.  Star Rating Drug: Farxiga 5 mg last filled on 08/12/2021 for a 90-Day supply with [place] Metformin 1000 mg last filled on 07/22/2021 for a 90-Days supply with [place] Rosuvastatin 20 mg last filled on 07/07/2021 for a 90-Day supply with [place] Valsartan 80 mg last filled on 07/23/2021 for a 90-Day supply with [place]  Any gaps in medications fill history? None  Care Gaps: COVID-19 Vaccine Zoster Vaccines Diabetic Eye Exam Influenza Vaccine Mammogram A1C> [place], CPA/CMA Clinical Pharmacist Assistant Phone: [phone]

## 2021-09-02 ENCOUNTER — Ambulatory Visit (INDEPENDENT_AMBULATORY_CARE_PROVIDER_SITE_OTHER): Payer: Medicare HMO

## 2021-09-02 DIAGNOSIS — E119 Type 2 diabetes mellitus without complications: Secondary | ICD-10-CM

## 2021-09-02 DIAGNOSIS — J449 Chronic obstructive pulmonary disease, unspecified: Secondary | ICD-10-CM

## 2021-09-02 NOTE — Progress Notes (Signed)
Chronic Care Management Pharmacy Note  09/17/2021 Name:  Tamara [name] MRN:  [id number] DOB:  1945-02-03  Summary: Patient presents for CCM follow-up.   Recommendations/Changes made from today's visit: -Continue current medications  Plan:  CPP follow-up 1 month  Subjective: Tamara [name] is an 76 y.o. year old female who is a primary patient of [name], [place].  The CCM team was consulted for assistance with disease management and care coordination needs.    Engaged with patient by telephone for follow up visit in response to provider referral for pharmacy case management and/or care coordination services.   Consent to Services:  The patient was given information about Chronic Care Management services, agreed to services, and gave verbal consent prior to initiation of services.  Please see initial visit note for detailed documentation.   Patient Care Team: [name], PA-C as PCP - General (Family Medicine) Germaine Pomfret, [facility] as Pharmacist (Pharmacist)  Recent office visits: 06/01/21: Patient presented to [name], PA-C for follow-up.  05/01/21: Patient presented to [name], PA-C for URI.   Recent consult visits: 08/18/21: Patient presented to Dr. Lamonte Sakai (Pulmonology)  08/11/21: Patient presented to Dr. Cruzita Lederer (Endocrinology). Farxiga 5 mg daily  06/08/21: Patient presented to Rexene Edison, NP (COPD) for follow-up.  05/22/21: Patient presented to Rexene Edison, NP (COPD) for COPD exacerbation. Levofloxacin + prednisone.  05/14/21: Patient presented to Rexene Edison, NP (COPD) for COPD exacerbation  Hospital visits: None in past 6 months  Objective:  Lab Results  Component Value Date   CREATININE 1.01 (H) 08/26/2021   BUN 17 08/26/2021   GFR 59.43 (L) 05/13/2020   GFRNONAA 63 11/15/2019   GFRAA 73 11/15/2019   NA 141 08/26/2021   K 4.6 08/26/2021   CALCIUM 10.2 08/26/2021   CO2 27 08/26/2021   GLUCOSE 121 (H) 08/26/2021    Lab Results   Component Value Date/Time   HGBA1C 10.1 (A) 08/11/2021 01:51 PM   HGBA1C 8.7 (A) 03/26/2021 01:16 PM   HGBA1C 10.8 (H) 03/22/2019 10:20 AM   HGBA1C 8.2 (H) 11/22/2018 12:00 AM   GFR 59.43 (L) 05/13/2020 02:38 PM   MICROALBUR 0.5 09/05/2017 08:17 AM   MICROALBUR 0.2 08/23/2016 08:25 AM    Last diabetic Eye exam:  Lab Results  Component Value Date/Time   HMDIABEYEEXA No Retinopathy 12/29/2017 12:00 AM    Last diabetic Foot exam: No results found for: "HMDIABFOOTEX"   Lab Results  Component Value Date   CHOL 185 08/26/2021   HDL 43 (L) 08/26/2021   LDLCALC 112 (H) 08/26/2021   TRIG 182 (H) 08/26/2021   CHOLHDL 4.3 08/26/2021       Latest Ref Rng & Units 08/26/2021   10:10 AM 07/28/2020   10:26 AM 11/15/2019    3:23 PM  Hepatic Function  Total Protein 6.1 - 8.1 g/dL 6.9  6.9  6.9   AST 10 - 35 U/L '22  27  31   '$ ALT 6 - 29 U/L '14  20  22   '$ Total Bilirubin 0.2 - 1.2 mg/dL 0.5  0.5  0.4     Lab Results  Component Value Date/Time   TSH 3.34 08/26/2021 10:10 AM   TSH 3.88 07/28/2020 10:26 AM   FREET4 1.2 11/22/2018 12:00 AM       Latest Ref Rng & Units 08/26/2021   10:10 AM 07/28/2020   10:26 AM 05/13/2020    2:38 PM  CBC  WBC 3.8 - 10.8 Thousand/uL 5.7  6.4  7.6   Hemoglobin 11.7 - 15.5 g/dL 10.9  10.7  11.1   Hematocrit 35.0 - 45.0 % 33.2  34.5  33.1   Platelets 140 - 400 Thousand/uL 164  154  159.0     No results found for: "VD25OH"  Clinical ASCVD: No  The 10-year ASCVD risk score (Arnett DK, et al., 2019) is: 36%   Values used to calculate the score:     Age: 29 years     Sex: Female     Is Non-Hispanic African American: No     Diabetic: Yes     Tobacco smoker: No     Systolic Blood Pressure: 962 mmHg     Is BP treated: Yes     HDL Cholesterol: 43 mg/dL     Total Cholesterol: 185 mg/dL       08/26/2021    9:35 AM 06/01/2021    3:10 PM 05/01/2021    1:33 PM  Depression screen PHQ 2/9  Decreased Interest 0 0 0  Down, Depressed, Hopeless 2 0 0  PHQ - 2  Score 2 0 0  Altered sleeping 0  0  Tired, decreased energy 0  0  Change in appetite 0  0  Feeling bad or failure about yourself  0  0  Trouble concentrating 0  0  Moving slowly or fidgety/restless 0  0  Suicidal thoughts 0  0  PHQ-9 Score 2  0  Difficult doing work/chores Not difficult at all  Not difficult at all     Social History   Tobacco Use  Smoking Status Former   Packs/day: 1.00   Years: 40.00   Total pack years: 40.00   Types: Cigarettes   Quit date: 2006   Years since quitting: 17.6  Smokeless Tobacco Never  Tobacco Comments   Smoking cessation materials not required   BP Readings from Last 3 Encounters:  08/26/21 134/68  08/18/21 138/78  08/11/21 118/80   Pulse Readings from Last 3 Encounters:  08/26/21 93  08/18/21 89  08/11/21 94   Wt Readings from Last 3 Encounters:  08/26/21 240 lb (108.9 kg)  08/18/21 243 lb 6.4 oz (110.4 kg)  08/11/21 240 lb 6.4 oz (109 kg)   BMI Readings from Last 3 Encounters:  08/26/21 38.74 kg/m  08/18/21 39.89 kg/m  08/11/21 38.80 kg/m    Assessment/Interventions: Review of patient past medical history, allergies, medications, health status, including review of consultants reports, laboratory and other test data, was performed as part of comprehensive evaluation and provision of chronic care management services.   SDOH:  (Social Determinants of Health) assessments and interventions performed: Yes    SDOH Screenings   Alcohol Screen: Low Risk  (10/16/2020)   Alcohol Screen    Last Alcohol Screening Score (AUDIT): 0  Depression (PHQ2-9): Low Risk  (08/26/2021)   Depression (PHQ2-9)    PHQ-2 Score: 2  Financial Resource Strain: Medium Risk (02/02/2021)   Overall Financial Resource Strain (CARDIA)    Difficulty of Paying Living Expenses: Somewhat hard  Food Insecurity: No Food Insecurity (10/16/2020)   Hunger Vital Sign    Worried About Running Out of Food in the Last Year: Never true    Ran Out of Food in the Last  Year: Never true  Housing: Low Risk  (10/16/2020)   Housing    Last Housing Risk Score: 0  Physical Activity: Inactive (10/16/2020)   Exercise Vital Sign    Days of Exercise per Week: 0 days  Minutes of Exercise per Session: 0 min  Social Connections: Moderately Isolated (10/16/2020)   Social Connection and Isolation Panel [NHANES]    Frequency of Communication with Friends and Family: More than three times a week    Frequency of Social Gatherings with Friends and Family: More than three times a week    Attends Religious Services: Never    Marine scientist or Organizations: No    Attends Archivist Meetings: Never    Marital Status: Married  Stress: No Stress Concern Present (10/16/2020)   [employer] of Stress : Not at all  Tobacco Use: Medium Risk (08/26/2021)   Patient History    Smoking Tobacco Use: Former    Smokeless Tobacco Use: Never    Passive Exposure: Not on file  Transportation Needs: No Transportation Needs (10/16/2020)   PRAPARE - Hydrologist (Medical): No    Lack of Transportation (Non-Medical): No    CCM Care Plan  Allergies  Allergen Reactions   Penicillins Hives    Reaction: 1964    Medications Reviewed Today     Reviewed by Teodora Medici, DO (Physician) on 08/26/21 at 66  Med List Status: <None>   Medication Order Taking? Sig Documenting Provider Last Dose Status Informant  Accu-Chek Softclix Lancets lancets [id number] Yes TEST BLOOD SUGAR TWICE DAILY [name], PA-C Taking Active   albuterol (PROVENTIL) (2.5 MG/3ML) 0.083% nebulizer solution [id number] Yes Take 3 mLs (2.5 mg total) by nebulization every 6 (six) hours as needed for wheezing or shortness of breath. Parrett, Fonnie Mu, NP Taking Active   albuterol (VENTOLIN HFA) 108 (90 Base) MCG/ACT inhaler [id number] Yes Inhale 2 puffs into the lungs every 6 (six) hours as needed for  wheezing or shortness of breath. Collene Gobble, MD Taking Active   Alcohol Swabs (B-D SINGLE USE SWABS REGULAR) PADS [id number] Yes USE  TO TEST BLOOD SUGAR TWICE DAILY Philemon Kingdom, MD Taking Active   Ascorbic Acid (VITAMIN C) 1000 MG tablet [id number] Yes Take 1,000 mg by mouth daily. [provider] Taking Active Self  Cholecalciferol (VITAMIN D) 50 MCG (2000 UT) CAPS [id number] Yes Take 1,000 Units by mouth daily. [provider] Taking Active Self  dapagliflozin propanediol (FARXIGA) 5 MG TABS tablet [id number] No Take 1 tablet (5 mg total) by mouth daily.  Patient not taking: Reported on 08/26/2021   Philemon Kingdom, MD Not Taking Active   DROPLET PEN NEEDLES 32G X 4 MM MISC [id number] Yes 1 Device by Other route daily. Philemon Kingdom, MD Taking Active   fluconazole (DIFLUCAN) 150 MG tablet [id number] Yes Take 1 tablet (150 mg total) by mouth daily. Parrett, Fonnie Mu, NP Taking Active   furosemide (LASIX) 40 MG tablet [id number] Yes Take 1 tablet (40 mg total) by mouth daily. Teodora Medici, DO  Active   hydrochlorothiazide (HYDRODIURIL) 25 MG tablet [id number]  Take 1 tablet (25 mg total) by mouth daily. Teodora Medici, DO  Active   Insulin NPH, Human,, Isophane, (NOVOLIN N FLEXPEN) 100 UNIT/ML Mayer Masker [id number] Yes Inject under skin 40 units each morning, and 24 units each evening Philemon Kingdom, MD Taking Active   levothyroxine (SYNTHROID) 50 MCG tablet [id number] Yes TAKE 1 TABLET DAILY BEFORE BREAKFAST. [name], PA-C Taking Active   meloxicam (MOBIC) 15 MG tablet [id number] Yes Take 1 tablet (15 mg total) by mouth daily. Teodora Medici, DO  Active   metFORMIN (GLUCOPHAGE) 1000 MG  tablet [id number] Yes Take 1 tablet (1,000 mg total) by mouth 2 (two) times daily. Philemon Kingdom, MD Taking Active   methocarbamol (ROBAXIN) 500 MG tablet [id number] Yes TAKE 1 TABLET EVERY 8 HOURS AS NEEDED FOR MUSCLE SPASMS. [name], PA-C Taking Active   nystatin  (MYCOSTATIN/NYSTOP) powder [id number] Yes Apply 1 application. topically daily as needed (yeast under belly). [name], PA-C Taking Active   pantoprazole (PROTONIX) 40 MG tablet [id number] Yes TAKE 1 TABLET TWICE DAILY  Patient taking differently: 40 mg daily.   Collene Gobble, MD Taking Active   potassium chloride SA (KLOR-CON M) 20 MEQ tablet [id number]  Take 1 tablet (20 mEq total) by mouth daily. Teodora Medici, DO  Active   rosuvastatin (CRESTOR) 20 MG tablet [id number]  Take 1 tablet (20 mg total) by mouth at bedtime. Teodora Medici, DO  Active   Tiotropium Bromide-Olodaterol (STIOLTO RESPIMAT) 2.5-2.5 MCG/ACT AERS [id number] Yes Inhale 2 puffs into the lungs daily. Collene Gobble, MD Taking Active   Tiotropium Bromide-Olodaterol 2.5-2.5 MCG/ACT AERS [id number] Yes Inhale 2 puffs into the lungs daily. Parrett, Fonnie Mu, NP Taking Active   TRUE METRIX BLOOD GLUCOSE TEST test strip [id number] Yes Use 2x a day Philemon Kingdom, MD Taking Active   valsartan (DIOVAN) 80 MG tablet [id number] Yes Take 1 tablet (80 mg total) by mouth daily. 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 12/07/2017   Anemia of chronic disease 09/05/2017   Colon polyps 09/05/2017   Hypertension 07/14/2016   Hyperlipemia 07/14/2016   Bilateral lower extremity edema 07/14/2016   GERD (gastroesophageal reflux disease) 07/14/2016   Screening for colorectal cancer 07/14/2016    Immunization History  Administered Date(s) Administered   Fluad Quad(high Dose 65+) 09/20/2018, 10/16/2019, 10/16/2020   Influenza, High Dose Seasonal PF 10/16/2019   Influenza,inj,Quad  PF,6+ Mos 10/18/2016, 09/29/2017   Pneumococcal Conjugate-13 10/16/2019   Pneumococcal Polysaccharide-23 03/26/2013    Conditions to be addressed/monitored:  Hypertension, Hyperlipidemia, Diabetes, GERD and COPD  Care Plan : General Pharmacy (Adult)  Updates made by Germaine Pomfret, RPH since 09/17/2021 12:00 AM     Problem: Hypertension, Hyperlipidemia, Diabetes, GERD and COPD   Priority: High     Long-Range Goal: Patient-Specific Goal   Start Date: 05/28/2020  Expected End Date: 09/18/2022  This Visit's Progress: On track  Recent Progress: On track  Priority: High  Note:   Current Barriers:  Unable to achieve control of COPD  Unable to maintain control of blood pressure  Pharmacist Clinical Goal(s):  Patient will achieve control of COPD as evidenced by stable breathing per patient report maintain control of blood pressure as evidenced by BP less than 140/90  through collaboration with PharmD and provider.   Interventions: 1:1 collaboration with [name], PA-C regarding development and update of comprehensive plan of care as evidenced by provider attestation and co-signature Inter-disciplinary care team collaboration (see longitudinal plan of care) Comprehensive medication review performed; medication list updated in electronic medical record  Hypertension (BP goal <140/90) -Uncontrolled -Current treatment: Furosmide 40 mg daily Hydrochlorothiazide 25 mg daily  Valsartan 80 mg  daily  -Medications previously tried: NA  -Current home readings: Has not been routinely monitoring -Denies hypotensive/hypertensive symptoms -Educated on Daily salt intake goal < 2300 mg; Importance of home blood pressure monitoring; -Counseled to monitor BP at home weekly, document, and provide log at future appointments  Hyperlipidemia: (LDL goal < 70) -Controlled -Current treatment: Rosuvastatin 20 mg daily  -Medications previously tried: NA  -Educated on Importance of limiting  foods high in cholesterol; -Recommended to continue current medication  Diabetes (A1c goal <8%) -Uncontrolled -Current medications: Metformin 1000 mg twice daily  NPH 40 units AM, 25 units nightly: Appropriate, Effective, Safe, Accessible -Medications previously tried: Metformin (worried it "destroys" her kidneys) -Current home glucose readings fasting glucose: 90-115. 135 this AM  Before supper: Often forgets to check.  -Denies hypoglycemic/hyperglycemic symptoms -Discussed that kidney damage is related to progression of diabetes, did not address resumption of metformin at this visit due to other patient concerns. Patient has follow-up with endocrinology later this month.   COPD (Goal: control symptoms and prevent exacerbations) -Uncontrolled -Current treatment  Albuterol nebulizer as needed: Appropriate, Effective, Safe, Accessible   Ventolin HFA 2 puffs every 6 hours as needed: Appropriate, Effective, Safe, Accessible  Stiolto 2 puffs daily   -Medications previously tried: Trelegy (worsened cough)  -Pulmonary function testing: FEV1 83%, FEV1/FVC 112%  mixed obstructive and restrictive lung disease -Current COPD Classification:  B (high sx, <2 exacerbations/yr) - Possibly E if second exacerbation -Exacerbations requiring treatment in last 6 months: Yes 4/27, requiring abx and steroids  -Patient reports consistent use of maintenance inhaler -Frequency of rescue inhaler use:  -continue current medications   Hypothyroidism (Goal: Maintain stable thyroid function) -Controlled -Current treatment  Levothyroxine 25 mcg daily  -Medications previously tried: NA  -Recommended to continue current medication  GERD (Goal: Prevent heartburn/reflux) -Controlled -Current treatment  Pantoprazole 40 mg daily  -Medications previously tried: NA -Patient with chronic cough of unclear etiology. Per patient reflux symptoms have been well controlled.    -Recommended to continue current  medication  Patient Goals/Self-Care Activities Patient will:  - check glucose twice daily; before breakfast and supper, document, and provide at future appointments check blood pressure three times weekly, document, and provide at future appointments  Follow Up Plan: Telephone follow up appointment with care management team member scheduled for:  09/23/2021 at 10:00 AM     Medication Assistance:  Will determine if patient assistance necessary for inhaler treatments  Patient's preferred pharmacy is:  [place], [address] [place] [address] Phone: [phone] Fax: [phone]  [place], [place] - [address] [place] [address] Phone: [phone] Fax: [phone]  Uses pill box? Yes Pt endorses 100% compliance  We discussed: Current pharmacy is preferred with insurance plan and patient is satisfied with pharmacy services Patient decided to: Continue current medication management strategy  Care Plan and Follow Up Patient Decision:  Patient agrees to Care Plan and Follow-up.  Plan: Telephone follow up appointment with care management team member scheduled for:  09/23/2021 at 10:00 AM  Malva Limes, [place] Pharmacist Practitioner  [facility] [phone]

## 2021-09-11 ENCOUNTER — Ambulatory Visit (HOSPITAL_COMMUNITY)
Admission: RE | Admit: 2021-09-11 | Discharge: 2021-09-11 | Disposition: A | Discharge status: Home / Self Care | Payer: Medicare HMO | Source: Ambulatory Visit | Attending: Otolaryngology | Admitting: Otolaryngology

## 2021-09-11 DIAGNOSIS — M25512 Pain in left shoulder: Secondary | ICD-10-CM | POA: Diagnosis not present

## 2021-09-11 DIAGNOSIS — M5021 Other cervical disc displacement,  high cervical region: Secondary | ICD-10-CM | POA: Diagnosis not present

## 2021-09-11 DIAGNOSIS — M4802 Spinal stenosis, cervical region: Secondary | ICD-10-CM | POA: Diagnosis not present

## 2021-09-11 DIAGNOSIS — M50221 Other cervical disc displacement at C4-C5 level: Secondary | ICD-10-CM | POA: Diagnosis not present

## 2021-09-11 DIAGNOSIS — M542 Cervicalgia: Secondary | ICD-10-CM | POA: Insufficient documentation

## 2021-09-11 NOTE — Assessment & Plan Note (Signed)
We reviewed your pulmonary function testing today. Please continue Stiolto 2 puffs once daily. Keep your albuterol available to use 2 puffs up to every 4 hours if needed for shortness of breath, chest tightness, wheezing.

## 2021-09-11 NOTE — Assessment & Plan Note (Signed)
Remains unchanged on repeat CT.   We reviewed your CT scan of the chest.  This is stable.  Good news.  We will plan to repeat your CT chest without contrast in July 2024. Please follow Dr. Lamonte Sakai in 6 months or sooner if you have any problems.

## 2021-09-17 DIAGNOSIS — E039 Hypothyroidism, unspecified: Secondary | ICD-10-CM | POA: Diagnosis not present

## 2021-09-17 DIAGNOSIS — E785 Hyperlipidemia, unspecified: Secondary | ICD-10-CM

## 2021-09-17 DIAGNOSIS — J449 Chronic obstructive pulmonary disease, unspecified: Secondary | ICD-10-CM

## 2021-09-17 DIAGNOSIS — Z794 Long term (current) use of insulin: Secondary | ICD-10-CM | POA: Diagnosis not present

## 2021-09-17 DIAGNOSIS — I10 Essential (primary) hypertension: Secondary | ICD-10-CM

## 2021-09-17 DIAGNOSIS — E1159 Type 2 diabetes mellitus with other circulatory complications: Secondary | ICD-10-CM | POA: Diagnosis not present

## 2021-09-17 NOTE — Patient Instructions (Signed)
Visit Information It was great speaking with you today!  Please let me know if you have any questions about our visit.   Goals Addressed   None     Patient Care Plan: General Pharmacy (Adult)     Problem Identified: Hypertension, Hyperlipidemia, Diabetes, GERD and COPD   Priority: High     Long-Range Goal: Patient-Specific Goal   Start Date: 05/28/2020  Expected End Date: 09/18/2022  This Visit's Progress: On track  Recent Progress: On track  Priority: High  Note:   Current Barriers:  Unable to achieve control of COPD  Unable to maintain control of blood pressure  Pharmacist Clinical Goal(s):  Patient will achieve control of COPD as evidenced by stable breathing per patient report maintain control of blood pressure as evidenced by BP less than 140/90  through collaboration with PharmD and provider.   Interventions: 1:1 collaboration with [name], PA-C regarding development and update of comprehensive plan of care as evidenced by provider attestation and co-signature Inter-disciplinary care team collaboration (see longitudinal plan of care) Comprehensive medication review performed; medication list updated in electronic medical record  Hypertension (BP goal <140/90) -Uncontrolled -Current treatment: Furosmide 40 mg daily Hydrochlorothiazide 25 mg daily  Valsartan 80 mg daily  -Medications previously tried: NA  -Current home readings: Has not been routinely monitoring -Denies hypotensive/hypertensive symptoms -Educated on Daily salt intake goal < 2300 mg; Importance of home blood pressure monitoring; -Counseled to monitor BP at home weekly, document, and provide log at future appointments  Hyperlipidemia: (LDL goal < 70) -Controlled -Current treatment: Rosuvastatin 20 mg daily  -Medications previously tried: NA  -Educated on Importance of limiting foods high in cholesterol; -Recommended to continue current medication  Diabetes (A1c goal  <8%) -Uncontrolled -Current medications: Metformin 1000 mg twice daily  NPH 40 units AM, 25 units nightly: Appropriate, Effective, Safe, Accessible -Medications previously tried: Metformin (worried it "destroys" her kidneys) -Current home glucose readings fasting glucose: 90-115. 135 this AM  Before supper: Often forgets to check.  -Denies hypoglycemic/hyperglycemic symptoms -Discussed that kidney damage is related to progression of diabetes, did not address resumption of metformin at this visit due to other patient concerns. Patient has follow-up with endocrinology later this month.   COPD (Goal: control symptoms and prevent exacerbations) -Uncontrolled -Current treatment  Albuterol nebulizer as needed: Appropriate, Effective, Safe, Accessible   Ventolin HFA 2 puffs every 6 hours as needed: Appropriate, Effective, Safe, Accessible  Stiolto 2 puffs daily   -Medications previously tried: Trelegy (worsened cough)  -Pulmonary function testing: FEV1 83%, FEV1/FVC 112%  mixed obstructive and restrictive lung disease -Current COPD Classification:  B (high sx, <2 exacerbations/yr) - Possibly E if second exacerbation -Exacerbations requiring treatment in last 6 months: Yes 4/27, requiring abx and steroids  -Patient reports consistent use of maintenance inhaler -Frequency of rescue inhaler use:  -continue current medications   Hypothyroidism (Goal: Maintain stable thyroid function) -Controlled -Current treatment  Levothyroxine 25 mcg daily  -Medications previously tried: NA  -Recommended to continue current medication  GERD (Goal: Prevent heartburn/reflux) -Controlled -Current treatment  Pantoprazole 40 mg daily  -Medications previously tried: NA -Patient with chronic cough of unclear etiology. Per patient reflux symptoms have been well controlled.    -Recommended to continue current medication  Patient Goals/Self-Care Activities Patient will:  - check glucose twice daily; before  breakfast and supper, document, and provide at future appointments check blood pressure three times weekly, document, and provide at future appointments  Follow Up Plan: Telephone follow up appointment with  care management team member scheduled for:  09/23/2021 at 10:00 AM    Patient agreed to services and verbal consent obtained.   The patient verbalized understanding of instructions, educational materials, and care plan provided today and DECLINED offer to receive copy of patient instructions, educational materials, and care plan.   Malva Limes, [place] Pharmacist Practitioner  [facility] [phone]

## 2021-09-22 ENCOUNTER — Other Ambulatory Visit: Payer: Self-pay | Admitting: Internal Medicine

## 2021-09-22 DIAGNOSIS — M25512 Pain in left shoulder: Secondary | ICD-10-CM

## 2021-09-23 ENCOUNTER — Ambulatory Visit (INDEPENDENT_AMBULATORY_CARE_PROVIDER_SITE_OTHER): Payer: Medicare HMO

## 2021-09-23 DIAGNOSIS — E119 Type 2 diabetes mellitus without complications: Secondary | ICD-10-CM

## 2021-09-23 DIAGNOSIS — J449 Chronic obstructive pulmonary disease, unspecified: Secondary | ICD-10-CM

## 2021-09-23 NOTE — Progress Notes (Signed)
Chronic Care Management Pharmacy Note  10/05/2021 Name:  Tamara [name] MRN:  [id number] DOB:  Jun 17, 1945  Summary: Patient presents for CCM follow-up.   Extensive in person education on inhaler technique was done in office today.   Recommendations/Changes made from today's visit: -Continue current medications  Plan:  CPP follow-up 3 month  Subjective: Tamara [name] is an 76 y.o. year old female who is a primary patient of [name], [place].  The CCM team was consulted for assistance with disease management and care coordination needs.    Engaged with patient by telephone for follow up visit in response to provider referral for pharmacy case management and/or care coordination services.   Consent to Services:  The patient was given information about Chronic Care Management services, agreed to services, and gave verbal consent prior to initiation of services.  Please see initial visit note for detailed documentation.   Patient Care Team: [name], PA-C as PCP - General (Family Medicine) Germaine Pomfret, [facility] as Pharmacist (Pharmacist)  Recent office visits: 06/01/21: Patient presented to [name], PA-C for follow-up.  05/01/21: Patient presented to [name], PA-C for URI.   Recent consult visits: 08/18/21: Patient presented to Dr. Lamonte Sakai (Pulmonology)  08/11/21: Patient presented to Dr. Cruzita Lederer (Endocrinology). Farxiga 5 mg daily  06/08/21: Patient presented to Rexene Edison, NP (COPD) for follow-up.  05/22/21: Patient presented to Rexene Edison, NP (COPD) for COPD exacerbation. Levofloxacin + prednisone.  05/14/21: Patient presented to Rexene Edison, NP (COPD) for COPD exacerbation  Hospital visits: None in past 6 months  Objective:  Lab Results  Component Value Date   CREATININE 1.01 (H) 08/26/2021   BUN 17 08/26/2021   GFR 59.43 (L) 05/13/2020   GFRNONAA 63 11/15/2019   GFRAA 73 11/15/2019   NA 141 08/26/2021   K 4.6 08/26/2021   CALCIUM 10.2  08/26/2021   CO2 27 08/26/2021   GLUCOSE 121 (H) 08/26/2021    Lab Results  Component Value Date/Time   HGBA1C 10.1 (A) 08/11/2021 01:51 PM   HGBA1C 8.7 (A) 03/26/2021 01:16 PM   HGBA1C 10.8 (H) 03/22/2019 10:20 AM   HGBA1C 8.2 (H) 11/22/2018 12:00 AM   GFR 59.43 (L) 05/13/2020 02:38 PM   MICROALBUR 0.5 09/05/2017 08:17 AM   MICROALBUR 0.2 08/23/2016 08:25 AM    Last diabetic Eye exam:  Lab Results  Component Value Date/Time   HMDIABEYEEXA No Retinopathy 12/29/2017 12:00 AM    Last diabetic Foot exam: No results found for: "HMDIABFOOTEX"   Lab Results  Component Value Date   CHOL 185 08/26/2021   HDL 43 (L) 08/26/2021   LDLCALC 112 (H) 08/26/2021   TRIG 182 (H) 08/26/2021   CHOLHDL 4.3 08/26/2021       Latest Ref Rng & Units 08/26/2021   10:10 AM 07/28/2020   10:26 AM 11/15/2019    3:23 PM  Hepatic Function  Total Protein 6.1 - 8.1 g/dL 6.9  6.9  6.9   AST 10 - 35 U/L '22  27  31   '$ ALT 6 - 29 U/L '14  20  22   '$ Total Bilirubin 0.2 - 1.2 mg/dL 0.5  0.5  0.4     Lab Results  Component Value Date/Time   TSH 3.34 08/26/2021 10:10 AM   TSH 3.88 07/28/2020 10:26 AM   FREET4 1.2 11/22/2018 12:00 AM       Latest Ref Rng & Units 08/26/2021   10:10 AM 07/28/2020   10:26 AM 05/13/2020  2:38 PM  CBC  WBC 3.8 - 10.8 Thousand/uL 5.7  6.4  7.6   Hemoglobin 11.7 - 15.5 g/dL 10.9  10.7  11.1   Hematocrit 35.0 - 45.0 % 33.2  34.5  33.1   Platelets 140 - 400 Thousand/uL 164  154  159.0     No results found for: "VD25OH"  Clinical ASCVD: No  The 10-year ASCVD risk score (Arnett DK, et al., 2019) is: 36%   Values used to calculate the score:     Age: 57 years     Sex: Female     Is Non-Hispanic African American: No     Diabetic: Yes     Tobacco smoker: No     Systolic Blood Pressure: 056 mmHg     Is BP treated: Yes     HDL Cholesterol: 43 mg/dL     Total Cholesterol: 185 mg/dL       08/26/2021    9:35 AM 06/01/2021    3:10 PM 05/01/2021    1:33 PM  Depression screen  PHQ 2/9  Decreased Interest 0 0 0  Down, Depressed, Hopeless 2 0 0  PHQ - 2 Score 2 0 0  Altered sleeping 0  0  Tired, decreased energy 0  0  Change in appetite 0  0  Feeling bad or failure about yourself  0  0  Trouble concentrating 0  0  Moving slowly or fidgety/restless 0  0  Suicidal thoughts 0  0  PHQ-9 Score 2  0  Difficult doing work/chores Not difficult at all  Not difficult at all     Social History   Tobacco Use  Smoking Status Former   Packs/day: 1.00   Years: 40.00   Total pack years: 40.00   Types: Cigarettes   Quit date: 2006   Years since quitting: 17.7  Smokeless Tobacco Never  Tobacco Comments   Smoking cessation materials not required   BP Readings from Last 3 Encounters:  08/26/21 134/68  08/18/21 138/78  08/11/21 118/80   Pulse Readings from Last 3 Encounters:  08/26/21 93  08/18/21 89  08/11/21 94   Wt Readings from Last 3 Encounters:  08/26/21 240 lb (108.9 kg)  08/18/21 243 lb 6.4 oz (110.4 kg)  08/11/21 240 lb 6.4 oz (109 kg)   BMI Readings from Last 3 Encounters:  08/26/21 38.74 kg/m  08/18/21 39.89 kg/m  08/11/21 38.80 kg/m    Assessment/Interventions: Review of patient past medical history, allergies, medications, health status, including review of consultants reports, laboratory and other test data, was performed as part of comprehensive evaluation and provision of chronic care management services.   SDOH:  (Social Determinants of Health) assessments and interventions performed: Yes SDOH Interventions    Flowsheet Row Chronic Care Management from 01/28/2021 in [facility] Most recent reading at 02/02/2021  1:13 PM Chronic Care Management from 09/18/2020 in [facility] Most recent reading at 10/17/2020  7:58 AM Clinical Support from 10/16/2020 in [facility] Most recent reading at 10/16/2020 11:43 AM Chronic Care Management from 05/28/2020 in [facility] Most recent reading at 05/28/2020 11:37 AM Chronic Care Management from 11/28/2019 in [facility] Most recent reading at 11/28/2019  2:51 PM Office Visit from 12/07/2017 in [place] Most recent reading at 12/07/2017  8:02 AM  SDOH Interventions        Food Insecurity Interventions -- -- Intervention Not Indicated -- -- --  Housing Interventions -- -- Intervention Not Indicated -- -- --  Transportation Interventions -- -- Intervention Not Indicated -- Intervention Not Indicated --  Depression Interventions/Treatment  -- -- -- -- -- Patient refuses Treatment  Financial Strain Interventions Intervention Not Indicated Intervention Not Indicated Intervention Not Indicated Intervention Not Indicated Intervention Not Indicated --  Physical Activity Interventions -- -- Intervention Not Indicated -- -- --  Stress Interventions -- -- Intervention Not Indicated -- -- --  Social Connections Interventions -- -- Intervention Not Indicated -- -- --        SDOH Screenings   Food Insecurity: No Food Insecurity (10/16/2020)  Housing: Low Risk  (10/16/2020)  Transportation Needs: No Transportation Needs (10/16/2020)  Alcohol Screen: Low Risk  (10/16/2020)  Depression (PHQ2-9): Low Risk  (08/26/2021)  Financial Resource Strain: Medium Risk (02/02/2021)  Physical Activity: Inactive (10/16/2020)  Social Connections: Moderately Isolated (10/16/2020)  Stress: No Stress Concern Present (10/16/2020)  Tobacco Use: Medium Risk (08/26/2021)    CCM Care Plan  Allergies  Allergen Reactions   Penicillins Hives    Reaction: 1964    Medications Reviewed Today     Reviewed by Teodora Medici, DO (Physician) on 08/26/21 at 1  Med List Status: <None>   Medication Order Taking? Sig Documenting Provider Last Dose Status Informant  Accu-Chek Softclix Lancets lancets [id number] Yes TEST BLOOD SUGAR TWICE DAILY [name], PA-C Taking Active   albuterol (PROVENTIL) (2.5  MG/3ML) 0.083% nebulizer solution [id number] Yes Take 3 mLs (2.5 mg total) by nebulization every 6 (six) hours as needed for wheezing or shortness of breath. Parrett, Fonnie Mu, NP Taking Active   albuterol (VENTOLIN HFA) 108 (90 Base) MCG/ACT inhaler [id number] Yes Inhale 2 puffs into the lungs every 6 (six) hours as needed for wheezing or shortness of breath. Collene Gobble, MD Taking Active   Alcohol Swabs (B-D SINGLE USE SWABS REGULAR) PADS [id number] Yes USE  TO TEST BLOOD SUGAR TWICE DAILY Philemon Kingdom, MD Taking Active   Ascorbic Acid (VITAMIN C) 1000 MG tablet [id number] Yes Take 1,000 mg by mouth daily. [provider] Taking Active Self  Cholecalciferol (VITAMIN D) 50 MCG (2000 UT) CAPS [id number] Yes Take 1,000 Units by mouth daily. [provider] Taking Active Self  dapagliflozin propanediol (FARXIGA) 5 MG TABS tablet [id number] No Take 1 tablet (5 mg total) by mouth daily.  Patient not taking: Reported on 08/26/2021   Philemon Kingdom, MD Not Taking Active   DROPLET PEN NEEDLES 32G X 4 MM MISC [id number] Yes 1 Device by Other route daily. Philemon Kingdom, MD Taking Active   fluconazole (DIFLUCAN) 150 MG tablet [id number] Yes Take 1 tablet (150 mg total) by mouth daily. Parrett, Fonnie Mu, NP Taking Active   furosemide (LASIX) 40 MG tablet [id number] Yes Take 1 tablet (40 mg total) by mouth daily. Teodora Medici, DO  Active   hydrochlorothiazide (HYDRODIURIL) 25 MG tablet [id number]  Take 1 tablet (25 mg total) by mouth daily. Teodora Medici, DO  Active   Insulin NPH, Human,, Isophane, (NOVOLIN N FLEXPEN) 100 UNIT/ML Mayer Masker [id number] Yes Inject under skin 40 units each morning, and 24 units each evening Philemon Kingdom, MD Taking Active   levothyroxine (SYNTHROID) 50 MCG tablet [id number] Yes TAKE 1 TABLET DAILY BEFORE BREAKFAST. [name], PA-C Taking Active   meloxicam (MOBIC) 15 MG tablet [id number] Yes Take 1 tablet (15 mg total) by mouth daily. Teodora Medici, DO  Active   metFORMIN (GLUCOPHAGE) 1000 MG tablet [id number] Yes Take 1 tablet (1,000 mg  total) by mouth 2 (two) times daily. Philemon Kingdom, MD Taking Active   methocarbamol (ROBAXIN) 500 MG tablet [id number] Yes TAKE 1 TABLET EVERY 8 HOURS AS NEEDED FOR MUSCLE SPASMS. [name], PA-C Taking Active   nystatin (MYCOSTATIN/NYSTOP) powder [id number] Yes Apply 1 application. topically daily as needed (yeast under belly). [name], PA-C Taking Active   pantoprazole (PROTONIX) 40 MG tablet [id number] Yes TAKE 1 TABLET TWICE DAILY  Patient taking differently: 40 mg daily.   Collene Gobble, MD Taking Active   potassium chloride SA (KLOR-CON M) 20 MEQ tablet [id number]  Take 1 tablet (20 mEq total) by mouth daily. Teodora Medici, DO  Active   rosuvastatin (CRESTOR) 20 MG tablet [id number]  Take 1 tablet (20 mg total) by mouth at bedtime. Teodora Medici, DO  Active   Tiotropium Bromide-Olodaterol (STIOLTO RESPIMAT) 2.5-2.5 MCG/ACT AERS [id number] Yes Inhale 2 puffs into the lungs daily. Collene Gobble, MD Taking Active   Tiotropium Bromide-Olodaterol 2.5-2.5 MCG/ACT AERS [id number] Yes Inhale 2 puffs into the lungs daily. Parrett, Fonnie Mu, NP Taking Active   TRUE METRIX BLOOD GLUCOSE TEST test strip [id number] Yes Use 2x a day Philemon Kingdom, MD Taking Active   valsartan (DIOVAN) 80 MG tablet [id number] Yes Take 1 tablet (80 mg total) by mouth daily. 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 12/07/2017   Anemia of chronic disease 09/05/2017   Colon polyps 09/05/2017   Hypertension 07/14/2016   Hyperlipemia 07/14/2016   Bilateral  lower extremity edema 07/14/2016   GERD (gastroesophageal reflux disease) 07/14/2016   Screening for colorectal cancer 07/14/2016    Immunization History  Administered Date(s) Administered   Fluad Quad(high Dose 65+) 09/20/2018, 10/16/2019, 10/16/2020   Influenza, High Dose Seasonal PF 10/16/2019   Influenza,inj,Quad PF,6+ Mos 10/18/2016, 09/29/2017   Pneumococcal Conjugate-13 10/16/2019   Pneumococcal Polysaccharide-23 03/26/2013    Conditions to be addressed/monitored:  Hypertension, Hyperlipidemia, Diabetes, GERD and COPD  Care Plan : General Pharmacy (Adult)  Updates made by Germaine Pomfret, RPH since 10/05/2021 12:00 AM     Problem: Hypertension, Hyperlipidemia, Diabetes, GERD and COPD   Priority: High     Long-Range Goal: Patient-Specific Goal   Start Date: 05/28/2020  Expected End Date: 09/18/2022  This Visit's Progress: On track  Recent Progress: On track  Priority: High  Note:   Current Barriers:  Unable to achieve control of COPD  Unable to maintain control of blood pressure  Pharmacist Clinical Goal(s):  Patient will achieve control of COPD as evidenced by stable breathing per patient report maintain control of blood pressure as evidenced by BP less than 140/90  through collaboration with PharmD and provider.   Interventions: 1:1 collaboration with [name], PA-C regarding development and update of comprehensive plan of care as evidenced by provider attestation and co-signature Inter-disciplinary care team collaboration (see longitudinal plan of care) Comprehensive medication review performed; medication list updated in electronic medical record  Hypertension (BP goal <140/90) -Uncontrolled -Current treatment: Furosmide 40 mg daily Hydrochlorothiazide 25 mg daily  Valsartan 80 mg daily  -Medications previously tried: NA  -Current home  readings: Has not been routinely monitoring -Denies hypotensive/hypertensive symptoms -Educated on Daily salt  intake goal < 2300 mg; Importance of home blood pressure monitoring; -Counseled to monitor BP at home weekly, document, and provide log at future appointments  Hyperlipidemia: (LDL goal < 70) -Controlled -Current treatment: Rosuvastatin 20 mg daily  -Medications previously tried: NA  -Educated on Importance of limiting foods high in cholesterol; -Recommended to continue current medication  Diabetes (A1c goal <8%) -Uncontrolled -Managed by Dr. Cruzita Lederer  -Current medications: Farxiga 5 mg daily  Metformin 1000 mg twice daily  NPH 40 units AM, 25 units nightly: Appropriate, Effective, Safe, Accessible -Medications previously tried:  -Current home glucose readings fasting glucose: averaging 135  Before supper: Often forgets to check.  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:  B (high sx, <2 exacerbations/yr)  -Exacerbations requiring treatment in last 6 months: Yes 4/27, requiring abx and steroids  -Patient reports consistent use of maintenance inhaler -Frequency of rescue inhaler use:  -continue current medications   Hypothyroidism (Goal: Maintain stable thyroid function) -Controlled -Current treatment  Levothyroxine 25 mcg daily  -Medications previously tried: NA  -Recommended to continue current medication  GERD (Goal: Prevent heartburn/reflux) -Controlled -Current treatment  Pantoprazole 40 mg daily  -Medications previously tried: NA -Patient with chronic cough of unclear  etiology. Per patient reflux symptoms have been well controlled.    -Recommended to continue current medication  Patient Goals/Self-Care Activities Patient will:  - check glucose twice daily; before breakfast and supper, document, and provide at future appointments check blood pressure three times weekly, document, and provide at future appointments  Follow Up Plan: Telephone follow up appointment with care management team member scheduled for:  12/24/2021 at 3:00 PM    Medication Assistance:  Will determine if patient assistance necessary for inhaler treatments  Patient's preferred pharmacy is:  [facility], [address] [place] [address] Phone: [phone] Fax: [phone]  [facility], [place] - [address] [place] [address] Phone: [phone] Fax: [phone]  Uses pill box? Yes Pt endorses 100% compliance  We discussed: Current pharmacy is preferred with insurance plan and patient is satisfied with pharmacy services Patient decided to: Continue current medication management strategy  Care Plan and Follow Up Patient Decision:  Patient agrees to Care Plan and Follow-up.  Plan: Telephone follow up appointment with care management team member scheduled for:  12/24/2021 at 3:00 PM  Malva Limes, [place] Pharmacist Practitioner  [facility] [phone]

## 2021-09-23 NOTE — Telephone Encounter (Signed)
Requested Prescriptions  Pending Prescriptions Disp Refills  . meloxicam (MOBIC) 15 MG tablet [Pharmacy Med Name: MELOXICAM 15 MG Tablet] 30 tablet 0    Sig: TAKE 1 TABLET EVERY DAY     Analgesics:  COX2 Inhibitors Failed - 09/22/2021 10:18 AM      Failed - Manual Review: Labs are only required if the patient has taken medication for more than 8 weeks.      Failed - HGB in normal range and within 360 days    Hemoglobin  Date Value Ref Range Status  08/26/2021 10.9 (L) 11.7 - 15.5 g/dL Final         Failed - Cr in normal range and within 360 days    Creat  Date Value Ref Range Status  08/26/2021 1.01 (H) 0.60 - 1.00 mg/dL Final         Failed - HCT in normal range and within 360 days    HCT  Date Value Ref Range Status  08/26/2021 33.2 (L) 35.0 - 45.0 % Final         Passed - AST in normal range and within 360 days    AST  Date Value Ref Range Status  08/26/2021 22 10 - 35 U/L Final         Passed - ALT in normal range and within 360 days    ALT  Date Value Ref Range Status  08/26/2021 14 6 - 29 U/L Final         Passed - eGFR is 30 or above and within 360 days    GFR, Est African American  Date Value Ref Range Status  11/15/2019 73 > OR = 60 mL/min/1.47m2 Final   GFR, Est Non African American  Date Value Ref Range Status  11/15/2019 63 > OR = 60 mL/min/1.46m2 Final   GFR  Date Value Ref Range Status  05/13/2020 59.43 (L) >60.00 mL/min Final    Comment:    Calculated using the CKD-EPI Creatinine Equation (2021)   eGFR  Date Value Ref Range Status  08/26/2021 58 (L) > OR = 60 mL/min/1.52m2 Final         Passed - Patient is not pregnant      Passed - Valid encounter within last 12 months    Recent Outpatient Visits          4 weeks ago Acute pain of left shoulder   [place], [place]   3 months ago Acute post-traumatic headache, not intractable   [facility] [name], PA-C   4 months ago Upper  respiratory tract infection, unspecified type   [facility] [name], PA-C   5 months ago Skin lesion of neck   [place], DO   7 months ago Primary hypertension   [facility] Teodora Medici, DO      Future Appointments            In 3 weeks  [facility], [place]   In 5 months [name], PA-C [facility]

## 2021-09-25 DIAGNOSIS — J441 Chronic obstructive pulmonary disease with (acute) exacerbation: Secondary | ICD-10-CM | POA: Diagnosis not present

## 2021-09-25 DIAGNOSIS — M542 Cervicalgia: Secondary | ICD-10-CM | POA: Diagnosis not present

## 2021-09-25 DIAGNOSIS — R053 Chronic cough: Secondary | ICD-10-CM | POA: Diagnosis not present

## 2021-09-29 DIAGNOSIS — M542 Cervicalgia: Secondary | ICD-10-CM | POA: Diagnosis not present

## 2021-10-02 DIAGNOSIS — M542 Cervicalgia: Secondary | ICD-10-CM | POA: Diagnosis not present

## 2021-10-05 DIAGNOSIS — M542 Cervicalgia: Secondary | ICD-10-CM | POA: Diagnosis not present

## 2021-10-05 NOTE — Patient Instructions (Signed)
Visit Information It was great speaking with you today!  Please let me know if you have any questions about our visit.   Goals Addressed             This Visit's Progress    Monitor and Manage My Blood Sugar-Diabetes Type 2   On track    Timeframe:  Long-Range Goal Priority:  High Start Date:  05/28/2020                            Expected End Date:  11/28/2021                     Follow Up within 90 days    -check blood sugar twice daily; before breakfast and before supper - check blood sugar if I feel it is too high or too low - enter blood sugar readings and medication or insulin into daily log - take the blood sugar meter to all doctor visits    Why is this important?   Checking your blood sugar at home helps to keep it from getting very high or very low.  Writing the results in a diary or log helps the doctor know how to care for you.  Your blood sugar log should have the time, date and the results.  Also, write down the amount of insulin or other medicine that you take.  Other information, like what you ate, exercise done and how you were feeling, will also be helpful.     Notes:         Patient Care Plan: General Pharmacy (Adult)     Problem Identified: Hypertension, Hyperlipidemia, Diabetes, GERD and COPD   Priority: High     Long-Range Goal: Patient-Specific Goal   Start Date: 05/28/2020  Expected End Date: 09/18/2022  This Visit's Progress: On track  Recent Progress: On track  Priority: High  Note:   Current Barriers:  Unable to achieve control of COPD  Unable to maintain control of blood pressure  Pharmacist Clinical Goal(s):  Patient will achieve control of COPD as evidenced by stable breathing per patient report maintain control of blood pressure as evidenced by BP less than 140/90  through collaboration with PharmD and provider.   Interventions: 1:1 collaboration with [name], PA-C regarding development and update of comprehensive plan of  care as evidenced by provider attestation and co-signature Inter-disciplinary care team collaboration (see longitudinal plan of care) Comprehensive medication review performed; medication list updated in electronic medical record  Hypertension (BP goal <140/90) -Uncontrolled -Current treatment: Furosmide 40 mg daily Hydrochlorothiazide 25 mg daily  Valsartan 80 mg daily  -Medications previously tried: NA  -Current home readings: Has not been routinely monitoring -Denies hypotensive/hypertensive symptoms -Educated on Daily salt intake goal < 2300 mg; Importance of home blood pressure monitoring; -Counseled to monitor BP at home weekly, document, and provide log at future appointments  Hyperlipidemia: (LDL goal < 70) -Controlled -Current treatment: Rosuvastatin 20 mg daily  -Medications previously tried: NA  -Educated on Importance of limiting foods high in cholesterol; -Recommended to continue current medication  Diabetes (A1c goal <8%) -Uncontrolled -Managed by Dr. Cruzita Lederer  -Current medications: Farxiga 5 mg daily  Metformin 1000 mg twice daily  NPH 40 units AM, 25 units nightly: Appropriate, Effective, Safe, Accessible -Medications previously tried:  -Current home glucose readings fasting glucose: averaging 135  Before supper: Often forgets to check.  -Denies hypoglycemic/hyperglycemic symptoms -Continue current medications  COPD (  Goal: control symptoms and prevent exacerbations) -Uncontrolled -Current treatment  Albuterol nebulizer as needed: Appropriate, Effective, Safe, Accessible   Ventolin HFA 2 puffs every 6 hours as needed: Appropriate, Effective, Safe, Accessible  Stiolto 2 puffs daily   -Medications previously tried: Trelegy (worsened cough)  -Pulmonary function testing: FEV1 83%, FEV1/FVC 112%  mixed obstructive and restrictive lung disease -Current COPD Classification:  B (high sx, <2 exacerbations/yr)  -Exacerbations requiring treatment in last 6 months:  Yes 4/27, requiring abx and steroids  -Patient reports consistent use of maintenance inhaler -Frequency of rescue inhaler use:  -continue current medications   Hypothyroidism (Goal: Maintain stable thyroid function) -Controlled -Current treatment  Levothyroxine 25 mcg daily  -Medications previously tried: NA  -Recommended to continue current medication  GERD (Goal: Prevent heartburn/reflux) -Controlled -Current treatment  Pantoprazole 40 mg daily  -Medications previously tried: NA -Patient with chronic cough of unclear etiology. Per patient reflux symptoms have been well controlled.    -Recommended to continue current medication  Patient Goals/Self-Care Activities Patient will:  - check glucose twice daily; before breakfast and supper, document, and provide at future appointments check blood pressure three times weekly, document, and provide at future appointments  Follow Up Plan: Telephone follow up appointment with care management team member scheduled for:  12/24/2021 at 3:00 PM    Patient agreed to services and verbal consent obtained.   The patient verbalized understanding of instructions, educational materials, and care plan provided today and DECLINED offer to receive copy of patient instructions, educational materials, and care plan.   Malva Limes, [place] Pharmacist Practitioner  [facility] [phone]

## 2021-10-06 DIAGNOSIS — Z0100 Encounter for examination of eyes and vision without abnormal findings: Secondary | ICD-10-CM | POA: Diagnosis not present

## 2021-10-06 DIAGNOSIS — H524 Presbyopia: Secondary | ICD-10-CM | POA: Diagnosis not present

## 2021-10-06 DIAGNOSIS — E119 Type 2 diabetes mellitus without complications: Secondary | ICD-10-CM | POA: Diagnosis not present

## 2021-10-06 LAB — HM DIABETES EYE EXAM

## 2021-10-08 DIAGNOSIS — M542 Cervicalgia: Secondary | ICD-10-CM | POA: Diagnosis not present

## 2021-10-12 DIAGNOSIS — M542 Cervicalgia: Secondary | ICD-10-CM | POA: Diagnosis not present

## 2021-10-14 DIAGNOSIS — M542 Cervicalgia: Secondary | ICD-10-CM | POA: Diagnosis not present

## 2021-10-16 DIAGNOSIS — M47812 Spondylosis without myelopathy or radiculopathy, cervical region: Secondary | ICD-10-CM | POA: Diagnosis not present

## 2021-10-16 DIAGNOSIS — M5412 Radiculopathy, cervical region: Secondary | ICD-10-CM | POA: Diagnosis not present

## 2021-10-16 DIAGNOSIS — Z794 Long term (current) use of insulin: Secondary | ICD-10-CM | POA: Diagnosis not present

## 2021-10-17 ENCOUNTER — Other Ambulatory Visit: Payer: Self-pay | Admitting: Internal Medicine

## 2021-10-17 DIAGNOSIS — E039 Hypothyroidism, unspecified: Secondary | ICD-10-CM | POA: Diagnosis not present

## 2021-10-17 DIAGNOSIS — E1159 Type 2 diabetes mellitus with other circulatory complications: Secondary | ICD-10-CM

## 2021-10-17 DIAGNOSIS — J449 Chronic obstructive pulmonary disease, unspecified: Secondary | ICD-10-CM | POA: Diagnosis not present

## 2021-10-17 DIAGNOSIS — R600 Localized edema: Secondary | ICD-10-CM

## 2021-10-17 DIAGNOSIS — Z794 Long term (current) use of insulin: Secondary | ICD-10-CM | POA: Diagnosis not present

## 2021-10-17 DIAGNOSIS — I10 Essential (primary) hypertension: Secondary | ICD-10-CM | POA: Diagnosis not present

## 2021-10-17 DIAGNOSIS — E785 Hyperlipidemia, unspecified: Secondary | ICD-10-CM

## 2021-10-19 NOTE — Telephone Encounter (Signed)
Requested medications are due for refill today.  yes  Requested medications are on the active medications list.  yes  Last refill. 08/26/2021 #30 1 rf  Future visit scheduled.   yes  Notes to clinic.  Failed protocol d/t missing lab.    Requested Prescriptions  Pending Prescriptions Disp Refills   furosemide (LASIX) 40 MG tablet [Pharmacy Med Name: FUROSEMIDE 40 MG Tablet] 60 tablet     Sig: TAKE 1 TABLET EVERY DAY     Cardiovascular:  Diuretics - Loop Failed - 10/17/2021 10:13 AM      Failed - Cr in normal range and within 180 days    Creat  Date Value Ref Range Status  08/26/2021 1.01 (H) 0.60 - 1.00 mg/dL Final         Failed - Mg Level in normal range and within 180 days    No results found for: "MG"       Passed - K in normal range and within 180 days    Potassium  Date Value Ref Range Status  08/26/2021 4.6 3.5 - 5.3 mmol/L Final         Passed - Ca in normal range and within 180 days    Calcium  Date Value Ref Range Status  08/26/2021 10.2 8.6 - 10.4 mg/dL Final         Passed - Na in normal range and within 180 days    Sodium  Date Value Ref Range Status  08/26/2021 141 135 - 146 mmol/L Final         Passed - Cl in normal range and within 180 days    Chloride  Date Value Ref Range Status  08/26/2021 104 98 - 110 mmol/L Final         Passed - Last BP in normal range    BP Readings from Last 1 Encounters:  08/26/21 134/68         Passed - Valid encounter within last 6 months    Recent Outpatient Visits           1 month ago Acute pain of left shoulder   [place], DO   4 months ago Acute post-traumatic headache, not intractable   [facility] [name], PA-C   5 months ago Upper respiratory tract infection, unspecified type   [facility] [name], PA-C   6 months ago Skin lesion of neck   [place], DO   8 months ago  Primary hypertension   [place], DO       Future Appointments             Tomorrow  [facility], [employer]   In 4 months [name], PA-C [facility], [facility]

## 2021-10-19 NOTE — Telephone Encounter (Signed)
Pt states she has enough and still has not used the refill remaining. She only uses it PRN

## 2021-10-20 ENCOUNTER — Telehealth: Payer: Self-pay

## 2021-10-20 ENCOUNTER — Ambulatory Visit (INDEPENDENT_AMBULATORY_CARE_PROVIDER_SITE_OTHER): Payer: Medicare HMO

## 2021-10-20 VITALS — BP 144/70 | HR 61 | Temp 98.1°F | Resp 18 | Ht 66.0 in | Wt 241.7 lb

## 2021-10-20 DIAGNOSIS — Z23 Encounter for immunization: Secondary | ICD-10-CM

## 2021-10-20 DIAGNOSIS — Z0000 Encounter for general adult medical examination without abnormal findings: Secondary | ICD-10-CM

## 2021-10-20 NOTE — Telephone Encounter (Signed)
The pt came in for her AW[facility] visit complaining of persistent bilateral lower extremities edema in her legs, ankles, and feet, +2  Pitting edema, denies any worsening SOB, (history of COPD), and denies any chest pain x several months. No change was notice with the edema after taking Lasix or even after elevating the legs. Appt scheduled for tomorrow at 1:20 pm with her PCP.

## 2021-10-20 NOTE — Progress Notes (Signed)
Subjective:   Tamara [name] is a 76 y.o. female who presents for Medicare Annual (Subsequent) preventive examination.  Review of Systems   Per HPI unless specifically indicated below.  Cardiac Risk Factors include: advanced age (>28mn, >>56women);female gender, hypertension, diabetes, and hyperlipidemia.          Objective:    Today's Vitals   10/20/21 1106 10/20/21 1122  BP: (!) 144/70   Pulse: 61   Resp: 18   Temp: 98.1 F (36.7 C)   TempSrc: Oral   SpO2: 96%   Weight: 241 lb 11.2 oz (109.6 kg)   Height: '5\' 6"'$  (1.676 m)   PainSc: 6  6   PainLoc: Knee    Body mass index is 39.01 kg/m.     10/16/2020   11:44 AM 05/19/2020    7:06 AM 10/10/2018   11:46 AM 02/08/2017    9:24 AM  Advanced Directives  Does Patient Have a Medical Advance Directive? No No No No  Would patient like information on creating a medical advance directive? No - Patient declined No - Patient declined Yes (MAU/Ambulatory/Procedural Areas - Information given) No - Patient declined    Current Medications (verified) Outpatient Encounter Medications as of 10/20/2021  Medication Sig   Accu-Chek Softclix Lancets lancets TEST BLOOD SUGAR TWICE DAILY   albuterol (PROVENTIL) (2.5 MG/3ML) 0.083% nebulizer solution Take 3 mLs (2.5 mg total) by nebulization every 6 (six) hours as needed for wheezing or shortness of breath.   albuterol (VENTOLIN HFA) 108 (90 Base) MCG/ACT inhaler Inhale 2 puffs into the lungs every 6 (six) hours as needed for wheezing or shortness of breath.   Alcohol Swabs (B-D SINGLE USE SWABS REGULAR) PADS USE  TO TEST BLOOD SUGAR TWICE DAILY   Ascorbic Acid (VITAMIN C) 1000 MG tablet Take 1,000 mg by mouth daily.   Cholecalciferol (VITAMIN D) 50 MCG (2000 UT) CAPS Take 1,000 Units by mouth daily.   DROPLET PEN NEEDLES 32G X 4 MM MISC 1 Device by Other route daily.   furosemide (LASIX) 40 MG tablet Take 1 tablet (40 mg total) by mouth daily. (Patient taking differently: Take 40 mg by mouth  daily as needed.)   hydrochlorothiazide (HYDRODIURIL) 25 MG tablet Take 1 tablet (25 mg total) by mouth daily.   Insulin NPH, Human,, Isophane, (NOVOLIN N FLEXPEN) 100 UNIT/ML Kiwkpen Inject under skin 40 units each morning, and 24 units each evening   levothyroxine (SYNTHROID) 50 MCG tablet Take 1 tablet (50 mcg total) by mouth daily before breakfast.   meloxicam (MOBIC) 15 MG tablet TAKE 1 TABLET EVERY DAY   metFORMIN (GLUCOPHAGE) 1000 MG tablet Take 1 tablet (1,000 mg total) by mouth 2 (two) times daily.   methocarbamol (ROBAXIN) 500 MG tablet TAKE 1 TABLET EVERY 8 HOURS AS NEEDED FOR MUSCLE SPASMS.   nystatin (MYCOSTATIN/NYSTOP) powder Apply 1 application. topically daily as needed (yeast under belly).   pantoprazole (PROTONIX) 40 MG tablet TAKE 1 TABLET TWICE DAILY (Patient taking differently: 40 mg daily.)   potassium chloride SA (KLOR-CON M) 20 MEQ tablet Take 1 tablet (20 mEq total) by mouth daily.   rosuvastatin (CRESTOR) 20 MG tablet Take 1 tablet (20 mg total) by mouth at bedtime.   TRUE METRIX BLOOD GLUCOSE TEST test strip Use 2x a day   valsartan (DIOVAN) 80 MG tablet Take 1 tablet (80 mg total) by mouth daily.   dapagliflozin propanediol (FARXIGA) 5 MG TABS tablet Take 1 tablet (5 mg total) by mouth daily. (Patient  not taking: Reported on 10/20/2021)   fluconazole (DIFLUCAN) 150 MG tablet Take 1 tablet (150 mg total) by mouth daily. (Patient not taking: Reported on 10/20/2021)   Tiotropium Bromide-Olodaterol (STIOLTO RESPIMAT) 2.5-2.5 MCG/ACT AERS Inhale 2 puffs into the lungs daily. (Patient not taking: Reported on 10/20/2021)   Tiotropium Bromide-Olodaterol 2.5-2.5 MCG/ACT AERS Inhale 2 puffs into the lungs daily. (Patient not taking: Reported on 10/20/2021)   No facility-administered encounter medications on file as of 10/20/2021.    Allergies (verified) Penicillins   History: Past Medical History:  Diagnosis Date   Arthritis    knee and shoulders   Diabetes mellitus without  complication ([place])    type II   Dyspnea    05/16/20 has had a cough for 2.5 years post Covid   Gastritis 09/05/2017   GERD (gastroesophageal reflux disease)    Hyperlipidemia    Hypertension    patient denies   Hypothyroidism    Panic attack    RUQ pain 03/09/2017   Per patient, she has had RUQ pain 4-5 years that has grown worse in the last few months.   Past Surgical History:  Procedure Laterality Date   BREAST BIOPSY Left 2008   CORE W/CLIP - NEG   BRONCHIAL BIOPSY  05/19/2020   Procedure: BRONCHIAL BIOPSIES;  Surgeon: Collene Gobble, MD;  Location: [facility] ENDOSCOPY;  Service: Pulmonary;;   BRONCHIAL BRUSHINGS  05/19/2020   Procedure: BRONCHIAL BRUSHINGS;  Surgeon: Collene Gobble, MD;  Location: [facility] ENDOSCOPY;  Service: Pulmonary;;   BRONCHIAL NEEDLE ASPIRATION BIOPSY  05/19/2020   Procedure: BRONCHIAL NEEDLE ASPIRATION BIOPSIES;  Surgeon: Collene Gobble, MD;  Location: [place];  Service: Pulmonary;;   BRONCHIAL WASHINGS  05/19/2020   Procedure: BRONCHIAL WASHINGS;  Surgeon: Collene Gobble, MD;  Location: [place];  Service: Pulmonary;;   COLONOSCOPY WITH PROPOFOL N/A 02/08/2017   Procedure: COLONOSCOPY WITH PROPOFOL;  Surgeon: Lollie Sails, MD;  Location[facility] ENDOSCOPY;  Service: Endoscopy;  Laterality: N/A;   ESOPHAGOGASTRODUODENOSCOPY (EGD) WITH PROPOFOL N/A 02/08/2017   Procedure: ESOPHAGOGASTRODUODENOSCOPY (EGD) WITH PROPOFOL;  Surgeon: Lollie Sails, MD;  Location: [facility] ENDOSCOPY;  Service: Endoscopy;  Laterality: N/A;   TUBAL LIGATION     VIDEO BRONCHOSCOPY WITH ENDOBRONCHIAL NAVIGATION N/A 05/19/2020   Procedure: VIDEO BRONCHOSCOPY WITH ENDOBRONCHIAL NAVIGATION;  Surgeon: Collene Gobble, MD;  Location: [place] ENDOSCOPY;  Service: Pulmonary;  Laterality: N/A;   Family History  Problem Relation Age of Onset   Diabetes Maternal Grandmother    Breast cancer Neg Hx    Social History   Socioeconomic History   Marital status: Married    Spouse name: Terryl Niziolek    Number of children: 3   Years of education: Not on file   Highest education level: High school graduate  Occupational History   Occupation: retired  Tobacco Use   Smoking status: Former    Packs/day: 1.00    Years: 40.00    Total pack years: 40.00    Types: Cigarettes    Quit date: 2006    Years since quitting: 17.7   Smokeless tobacco: Never   Tobacco comments:    Smoking cessation materials not required  Vaping Use   Vaping Use: Never used  Substance and Sexual Activity   Alcohol use: No   Drug use: No   Sexual activity: Not on file  Other Topics Concern   Not on file  Social History Narrative   Not on file   Social Determinants of Health  Financial Resource Strain: Medium Risk (10/20/2021)   Overall Financial Resource Strain (CARDIA)    Difficulty of Paying Living Expenses: Somewhat hard  Food Insecurity: No Food Insecurity (10/20/2021)   Hunger Vital Sign    Worried About Running Out of Food in the Last Year: Never true    Ran Out of Food in the Last Year: Never true  Transportation Needs: No Transportation Needs (10/20/2021)   PRAPARE - Hydrologist (Medical): No    Lack of Transportation (Non-Medical): No  Physical Activity: Inactive (10/20/2021)   Exercise Vital Sign    Days of Exercise per Week: 0 days    Minutes of Exercise per Session: 0 min  Stress: Stress Concern Present (10/20/2021)   [employer]    Feeling of Stress : To some extent  Social Connections: Moderately Integrated (10/20/2021)   Social Connection and Isolation Panel [NHANES]    Frequency of Communication with Friends and Family: More than three times a week    Frequency of Social Gatherings with Friends and Family: More than three times a week    Attends Religious Services: 1 to 4 times per year    Active Member of [employer] or Organizations: No    Attends Music therapist: Never    Marital  Status: Married    Tobacco Counseling Counseling given: No Tobacco comments: Smoking cessation materials not required   Clinical Intake:  Pre-visit preparation completed: No  Pain : 0-10 Pain Score: 6  Pain Type: Chronic pain Pain Location: Knee Pain Orientation: Right, Left     Nutritional Status: BMI > 30  Obese Nutritional Risks: None Diabetes: Yes CBG done?: No CBG resulted in Enter/ Edit results?: No Did pt. bring in CBG monitor from home?: No  How often do you need to have someone help you when you read instructions, pamphlets, or other written materials from your doctor or pharmacy?: 1 - Never  Diabetic?Nutrition Risk Assessment:  Has the patient had any N/V/D within the last 2 months?  No  Does the patient have any non-healing wounds?  No  Has the patient had any unintentional weight loss or weight gain?  No   Diabetes:  Is the patient diabetic?  Yes  If diabetic, was a CBG obtained today?  Yes  Did the patient bring in their glucometer from home?  No How often do you monitor your CBG's? Twice daily.   Financial Strains and Diabetes Management:  Are you having any financial strains with the device, your supplies or your medication? No .  Does the patient want to be seen by Chronic Care Management for management of their diabetes?  No  Would the patient like to be referred to a Nutritionist or for Diabetic Management?  No   Diabetic Exams:  Diabetic Eye Exam: Completed [facility] Diabetic Foot Exam: Completed 08/11/21    Interpreter Needed?: No  Information entered by :: Donnie Mesa, CMA   Activities of Daily Living    08/26/2021    9:35 AM 06/01/2021    3:10 PM  In your present state of health, do you have any difficulty performing the following activities:  Hearing? 0 0  Vision? 0 0  Difficulty concentrating or making decisions? 1 0  Walking or climbing stairs? 1 1  Dressing or bathing? 0 0  Doing errands, shopping? 0 0    Patient  Care Team: [name], PA-C as PCP - General (Family  Medicine) Germaine Pomfret, [facility] as Pharmacist (Pharmacist)  Indicate any recent Medical Services you may have received from other than [facility] providers in the past year (date may be approximate). No hospitalization in the past 12 months.    Assessment:   This is a routine wellness examination for Julene.  Hearing/Vision screen Denies any hearing issues. Wear glasses, annual Eye Exam done at [facility] issues and exercise activities discussed: Current Exercise Habits: The patient does not participate in regular exercise at present, Exercise limited by: orthopedic condition(s);respiratory conditions(s)   Goals Addressed             This Visit's Progress    Achieve a Healthy Weight           Why is this important?   When you are ready to manage weight, have a plan and set a goal, it is time to act.  Taking small steps to change how your eat and exercise is a good place to start.       Depression Screen    10/20/2021   11:14 AM 08/26/2021    9:35 AM 06/01/2021    3:10 PM 05/01/2021    1:33 PM 03/30/2021   11:26 AM 01/29/2021   10:52 AM 10/16/2020   11:43 AM  PHQ 2/9 Scores  PHQ - 2 Score 0 2 0 0 0 0 0  PHQ- 9 Score 9 2  0 0 0     Fall Risk    10/20/2021   11:16 AM 08/26/2021    9:32 AM 06/01/2021    3:09 PM 05/01/2021    1:33 PM 03/30/2021   11:26 AM  Fall Risk   Falls in the past year? 0 0 0 0 0  Number falls in past yr: 0 0 0 0 0  Injury with Fall? 0 0 0 0 0  Risk for fall due to : No Fall Risks  No Fall Risks No Fall Risks   Follow up Falls evaluation completed  Falls prevention discussed Falls prevention discussed;Education provided     FALL RISK PREVENTION PERTAINING TO THE HOME:  Any stairs in or around the home? No  If so, are there any without handrails? No  Home free of loose throw rugs in walkways, pet beds, electrical cords, etc? Yes  Adequate lighting in your home to reduce risk of  falls? Yes   ASSISTIVE DEVICES UTILIZED TO PREVENT FALLS:  Life alert? No  Use of a cane, walker or w/c? No  Grab bars in the bathroom? Yes  Shower chair or bench in shower? No  Elevated toilet seat or a handicapped toilet? Yes   TIMED UP AND GO:  Was the test performed? Yes .  Length of time to ambulate 10 feet: 10 sec.   Gait slow and steady without use of assistive device  Cognitive Function:        10/20/2021   11:21 AM 10/16/2019   11:50 AM 10/10/2018   11:49 AM  6CIT Screen  What Year? 0 points 0 points 0 points  What month? 0 points 0 points 0 points  What time? 0 points 0 points 0 points  Count back from 20 0 points 0 points 0 points  Months in reverse 0 points 0 points 0 points  Repeat phrase 0 points 2 points 0 points  Total Score 0 points 2 points 0 points    Immunizations Immunization History  Administered Date(s) Administered   Fluad Quad(high Dose 65+) 09/20/2018, 10/16/2019,  10/16/2020   Influenza, High Dose Seasonal PF 10/16/2019   Influenza,inj,Quad PF,6+ Mos 10/18/2016, 09/29/2017   Pneumococcal Conjugate-13 10/16/2019   Pneumococcal Polysaccharide-23 03/26/2013    TDAP status: Due, Education has been provided regarding the importance of this vaccine. Advised may receive this vaccine at local pharmacy or Health Dept. Aware to provide a copy of the vaccination record if obtained from local pharmacy or Health Dept. Verbalized acceptance and understanding.  Flu Vaccine status: Due, Education has been provided regarding the importance of this vaccine. Advised may receive this vaccine at local pharmacy or Health Dept. Aware to provide a copy of the vaccination record if obtained from local pharmacy or Health Dept. Verbalized acceptance and understanding.  Pneumococcal vaccine status: Up to date  Covid-19 vaccine status: Declined, Education has been provided regarding the importance of this vaccine but patient still declined. Advised may receive this  vaccine at local pharmacy or Health Dept.or vaccine clinic. Aware to provide a copy of the vaccination record if obtained from local pharmacy or Health Dept. Verbalized acceptance and understanding.  Qualifies for Shingles Vaccine? Yes   Zostavax completed No   Shingrix Completed?: No.    Education has been provided regarding the importance of this vaccine. Patient has been advised to call insurance company to determine out of pocket expense if they have not yet received this vaccine. Advised may also receive vaccine at local pharmacy or Health Dept. Verbalized acceptance and understanding.  Screening Tests Health Maintenance  Topic Date Due   COVID-19 Vaccine (1) Never done   Diabetic kidney evaluation - Urine ACR  Never done   Zoster Vaccines- Shingrix (1 of 2) Never done   OPHTHALMOLOGY EXAM  01/24/2019   MAMMOGRAM  03/08/2019   INFLUENZA VACCINE  08/18/2021   COLONOSCOPY (Pts 45-66yr Insurance coverage will need to be confirmed)  02/08/2022   HEMOGLOBIN A1C  02/11/2022   FOOT EXAM  08/12/2022   Diabetic kidney evaluation - GFR measurement  08/27/2022   Pneumonia Vaccine 75 Years old  Completed   DEXA SCAN  Completed   Hepatitis C Screening  Completed   HPV VACCINES  Aged Out   TETANUS/TDAP  Discontinued    Health Maintenance  Health Maintenance Due  Topic Date Due   COVID-19 Vaccine (1) Never done   Diabetic kidney evaluation - Urine ACR  Never done   Zoster Vaccines- Shingrix (1 of 2) Never done   OPHTHALMOLOGY EXAM  01/24/2019   MAMMOGRAM  03/08/2019   INFLUENZA VACCINE  08/18/2021    Colorectal cancer screening: Type of screening: Colonoscopy. Completed 02/08/2017. Repeat every 5 years  Mammogram status: Ordered 01/29/2021. Pt provided with contact info and advised to call to schedule appt.   DEXA Scan: completed 03/07/2018  Lung Cancer Screening: (Low Dose CT Chest recommended if Age 75-80years, 30 pack-year currently smoking OR have quit w/in 15years.) does not  qualify.     Additional Screening:  Hepatitis C Screening: does qualify; Completed 08/23/2016  Vision Screening: Recommended annual ophthalmology exams for early detection of glaucoma and other disorders of the eye. Is the patient up to date with their annual eye exam?  No  Who is the provider or what is the name of the office in which the patient attends annual eye exams? [facility] in [place] If pt is not established with a provider, would they like to be referred to a provider to establish care? No .   Dental Screening: Recommended annual dental exams for proper oral hygiene  Community Resource Referral / Chronic Care Management: CRR required this visit?  No   CCM required this visit?  No      Plan:     I have personally reviewed and noted the following in the patient's chart:   Medical and social history Use of alcohol, tobacco or illicit drugs  Current medications and supplements including opioid prescriptions. Patient is not currently taking opioid prescriptions. Functional ability and status Nutritional status Physical activity Advanced directives List of other physicians Hospitalizations, surgeries, and ER visits in previous 12 months Vitals Screenings to include cognitive, depression, and falls Referrals and appointments  In addition, I have reviewed and discussed with patient certain preventive protocols, quality metrics, and best practice recommendations. A written personalized care plan for preventive services as well as general preventive health recommendations were provided to patient.    Ms. Hanser , Thank you for taking time to come for your Medicare Wellness Visit. I appreciate your ongoing commitment to your health goals. Please review the following plan we discussed and let me know if I can assist you in the future.   These are the goals we discussed:  Goals      Achieve a Healthy Weight         Why is this important?   When you are ready  to manage weight, have a plan and set a goal, it is time to act.  Taking small steps to change how your eat and exercise is a good place to start.      DIET - INCREASE WATER INTAKE     Recommend drinking 6-8 glasses of water per day     Monitor and Manage My Blood Sugar-Diabetes Type 2     Timeframe:  Long-Range Goal Priority:  High Start Date:  05/28/2020                            Expected End Date:  11/28/2021                     Follow Up within 90 days    -check blood sugar twice daily; before breakfast and before supper - check blood sugar if I feel it is too high or too low - enter blood sugar readings and medication or insulin into daily log - take the blood sugar meter to all doctor visits    Why is this important?   Checking your blood sugar at home helps to keep it from getting very high or very low.  Writing the results in a diary or log helps the doctor know how to care for you.  Your blood sugar log should have the time, date and the results.  Also, write down the amount of insulin or other medicine that you take.  Other information, like what you ate, exercise done and how you were feeling, will also be helpful.     Notes:      Track and Manage My Blood Pressure-Hypertension     Timeframe:  Long-Range Goal Priority:  High Start Date: 05/28/2020                             Expected End Date: 11/28/2021                      Follow Up within 90 days   - check blood pressure 3 times per  week    Why is this important?   You won't feel high blood pressure, but it can still hurt your blood vessels.  High blood pressure can cause heart or kidney problems. It can also cause a stroke.  Making lifestyle changes like losing a little weight or eating less salt will help.  Checking your blood pressure at home and at different times of the day can help to control blood pressure.  If the doctor prescribes medicine remember to take it the way the doctor ordered.  Call the  office if you cannot afford the medicine or if there are questions about it.     Notes:      Weight (lb) < 200 lb (90.7 kg)     Pt would like to lose weight over the next year with healthy eating and physical activity        This is a list of the screening recommended for you and due dates:  Health Maintenance  Topic Date Due   COVID-19 Vaccine (1) Never done   Yearly kidney health urinalysis for diabetes  Never done   Zoster (Shingles) Vaccine (1 of 2) Never done   Eye exam for diabetics  01/24/2019   Mammogram  03/08/2019   Flu Shot  08/18/2021   Colon Cancer Screening  02/08/2022   Hemoglobin A1C  02/11/2022   Complete foot exam   08/12/2022   Yearly kidney function blood test for diabetes  08/27/2022   Pneumonia Vaccine  Completed   DEXA scan (bone density measurement)  Completed   Hepatitis C Screening: USPSTF Recommendation to screen - Ages 66-79 yo.  Completed   HPV Vaccine  Aged Out   Tetanus Vaccine  Discontinued     Reining Singer, [facility]   10/20/2021   Nurse Notes: Approximately 30-minute Face-to-Face Visit. The pt came in today complaining of persistent bilateral lower extremities edema in her legs, ankles, and feet, +2  Pitting edema, denies any worsening SOB, (history of COPD), and denies any chest pain x several months. No change was notice with the edema after taking Lasix or even after elevating the legs. Appt scheduled for tomorrow at 1:20 pm with her PCP.

## 2021-10-20 NOTE — Patient Instructions (Signed)

## 2021-10-21 ENCOUNTER — Ambulatory Visit (INDEPENDENT_AMBULATORY_CARE_PROVIDER_SITE_OTHER): Payer: Medicare HMO | Admitting: Family Medicine

## 2021-10-21 ENCOUNTER — Encounter: Payer: Self-pay | Admitting: Family Medicine

## 2021-10-21 VITALS — BP 136/74 | HR 95 | Temp 97.9°F | Resp 16 | Ht 66.0 in | Wt 240.3 lb

## 2021-10-21 DIAGNOSIS — I2510 Atherosclerotic heart disease of native coronary artery without angina pectoris: Secondary | ICD-10-CM | POA: Insufficient documentation

## 2021-10-21 DIAGNOSIS — R600 Localized edema: Secondary | ICD-10-CM | POA: Diagnosis not present

## 2021-10-21 DIAGNOSIS — R053 Chronic cough: Secondary | ICD-10-CM

## 2021-10-21 DIAGNOSIS — D638 Anemia in other chronic diseases classified elsewhere: Secondary | ICD-10-CM

## 2021-10-21 DIAGNOSIS — K7460 Unspecified cirrhosis of liver: Secondary | ICD-10-CM

## 2021-10-21 DIAGNOSIS — Z794 Long term (current) use of insulin: Secondary | ICD-10-CM | POA: Diagnosis not present

## 2021-10-21 DIAGNOSIS — I10 Essential (primary) hypertension: Secondary | ICD-10-CM | POA: Diagnosis not present

## 2021-10-21 DIAGNOSIS — Z6838 Body mass index (BMI) 38.0-38.9, adult: Secondary | ICD-10-CM

## 2021-10-21 DIAGNOSIS — R0600 Dyspnea, unspecified: Secondary | ICD-10-CM

## 2021-10-21 DIAGNOSIS — J984 Other disorders of lung: Secondary | ICD-10-CM | POA: Diagnosis not present

## 2021-10-21 DIAGNOSIS — K219 Gastro-esophageal reflux disease without esophagitis: Secondary | ICD-10-CM

## 2021-10-21 DIAGNOSIS — E119 Type 2 diabetes mellitus without complications: Secondary | ICD-10-CM

## 2021-10-21 DIAGNOSIS — I700 Atherosclerosis of aorta: Secondary | ICD-10-CM | POA: Insufficient documentation

## 2021-10-21 DIAGNOSIS — E1165 Type 2 diabetes mellitus with hyperglycemia: Secondary | ICD-10-CM | POA: Diagnosis not present

## 2021-10-21 DIAGNOSIS — E782 Mixed hyperlipidemia: Secondary | ICD-10-CM

## 2021-10-21 NOTE — Assessment & Plan Note (Signed)
Uncontrolled T2 IDDM - managed by endocrinology on metformin and insulin  Could not afford farxiga Due for urine microalbumin testing today - otherwise she sees endocrinology, A1C reviewed, DM eye exam done and reviewed results today Lab Results  Component Value Date   HGBA1C 10.1 (A) 08/11/2021

## 2021-10-21 NOTE — Assessment & Plan Note (Signed)
Lipids poorly controlled, she has been on crestor for several years 20 mg dose Lab Results  Component Value Date   CHOL 185 08/26/2021   HDL 43 (L) 08/26/2021   LDLCALC 112 (H) 08/26/2021   TRIG 182 (H) 08/26/2021   CHOLHDL 4.3 08/26/2021   Would recommend increasing dose to 40 (looks like she had this previously) and recheck lipids and tolerance If unable to tolerate would con't 20 mg crestor and add zetia - I do have my concerns about this pts overall compliance

## 2021-10-21 NOTE — Assessment & Plan Note (Addendum)
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

## 2021-10-21 NOTE — Assessment & Plan Note (Signed)
Unchanged as of last chest CT - managed by pulmonology F/up CT July 2024

## 2021-10-21 NOTE — Patient Instructions (Signed)
Take the lasix 20 - 40 mg once daily int he morning for the next 2-3 days, use compression stocking, elevated legs, and make sure you are on low salt diet   We will have you follow up with cardiology and reestablish with GI regardless of how your leg swelling responds.  You should continue to do low salt, compression socks, elevate legs and use lasix as needed for swelling. If it doesn't work or swelling progresses, especially with any worsening shortness of breath or new chest pain or palpitations - then you need to be seen right away.  Edema  Edema is when you have too much fluid in your body or under your skin. Edema may make your legs, feet, and ankles swell. Swelling often happens in looser tissues, such as around your eyes. This is a common condition. It gets more common as you get older. There are many possible causes of edema. These include: Eating too much salt (sodium). Being on your feet or sitting for a long time. Certain medical conditions, such as: Pregnancy. Heart failure. Liver disease. Kidney disease. Cancer. Hot weather may make edema worse. Edema is usually painless. Your skin may look swollen or shiny. Follow these instructions at home: Medicines Take over-the-counter and prescription medicines only as told by your doctor. Your doctor may prescribe a medicine to help your body get rid of extra water (diuretic). Take this medicine if you are told to take it. Eating and drinking Eat a low-salt (low-sodium) diet as told by your doctor. Sometimes, eating less salt may reduce swelling. Depending on the cause of your swelling, you may need to limit how much fluid you drink (fluid restriction). General instructions Raise the injured area above the level of your heart while you are sitting or lying down. Do not sit still or stand for a long time. Do not wear tight clothes. Do not wear garters on your upper legs. Exercise your legs. This can help the swelling go down. Wear  compression stockings as told by your doctor. It is important that these are the right size. These should be prescribed by your doctor to prevent possible injuries. If elastic bandages or wraps are recommended, use them as told by your doctor. Contact a doctor if: Treatment is not working. You have heart, liver, or kidney disease and have symptoms of edema. You have sudden and unexplained weight gain. Get help right away if: You have shortness of breath or chest pain. You cannot breathe when you lie down. You have pain, redness, or warmth in the swollen areas. You have heart, liver, or kidney disease and get edema all of a sudden. You have a fever and your symptoms get worse all of a sudden. These symptoms may be an emergency. Get help right away. Call 911. Do not wait to see if the symptoms will go away. Do not drive yourself to the hospital. Summary Edema is when you have too much fluid in your body or under your skin. Edema may make your legs, feet, and ankles swell. Swelling often happens in looser tissues, such as around your eyes. Raise the injured area above the level of your heart while you are sitting or lying down. Follow your doctor's instructions about diet and how much fluid you can drink. This information is not intended to replace advice given to you by your health care provider. Make sure you discuss any questions you have with your health care provider. Document Revised: 09/08/2020 Document Reviewed: 09/08/2020 Elsevier Patient Education  [employer].   We need cardiology to help [place] evaluate your heart - to assess coronary artery disease which was seen on CT scans and to see if you may have heart failure Heart Failure, Diagnosis  Heart failure means that your heart is not able to pump blood in the right way. This makes it hard for your body to work well. Heart failure is usually a long-term (chronic) condition. You must take good care of yourself and follow your  treatment plan from your doctor. Different stages of heart failure have different treatment plans. The stages are: Stage A: At risk for heart failure. Stage B: Pre-heart failure. Stage C: Symptomatic heart failure. Stage D: Advanced heart failure. What are the causes? High blood pressure. Buildup of cholesterol and fat in the arteries. Heart attack. This injures the heart muscle. Heart valves that do not open and close properly. Damage of the heart muscle. This is also called cardiomyopathy. Infection of the heart muscle. This is also called myocarditis. Lung disease. What increases the risk? Getting older. The risk of heart failure goes up as a person ages. Being overweight. Using tobacco or nicotine products. Abusing alcohol or drugs. Having taken medicines that can damage the heart. Having any of these conditions: Diabetes. Abnormal heart rhythms. Thyroid problems. Low blood counts (anemia). Having a family history of heart failure. What are the signs or symptoms? Shortness of breath. Coughing. Swelling of the feet, ankles, legs, or belly. Losing or gaining weight for no reason. Trouble breathing. Waking from sleep because of the need to sit up and get more air. Fast heartbeat. Other symptoms may include: Being very tired. Feeling dizzy, or feeling like you may pass out (faint). Having no desire to eat. Feeling like you may vomit (nauseous). Peeing (urinating) more at night. Feeling confused. How is this treated? This condition may be treated with: Medicines. These can be given to treat blood pressure and to make the heart muscles stronger. Changes in your daily life. These may include: Eating a healthy diet. Staying at a healthy body weight. Quitting tobacco, alcohol, and drug use. Doing exercises. Participating in a cardiac rehabilitation program. This program helps you improve your health through exercise, education, and counseling. Surgery. Surgery can be done  to open blocked valves or to put devices in the heart, such as pacemakers. A donor heart (heart transplant). You will receive a healthy heart from a donor. Follow these instructions at home: Treat other conditions as told by your doctor. These may include high blood pressure, diabetes, thyroid disease, or abnormal heart rhythms. Learn as much as you can about heart failure. Get support as you need it. Keep all follow-up visits. Where to find more information American Heart Association: www.heart.org Centers for Disease Control and Prevention: http://www.wolf.info/ National Institute on Aging: http://kim-miller.com/ Summary Heart failure means that your heart is not able to pump blood in the right way. This condition is often caused by high blood pressure, heart attack, or damage of the heart muscle. Symptoms of this condition include shortness of breath and swelling of the feet, ankles, legs, or belly. You may also feel very tired or feel like you may vomit. You may be treated with medicines, surgery, or changes in your daily life. Treat other health conditions as told by your doctor. This information is not intended to replace advice given to you by your health care provider. Make sure you discuss any questions you have with your health care provider. Document Revised: 07/03/2020 Document Reviewed:  07/28/2019 Elsevier Patient Education  [place].

## 2021-10-21 NOTE — Assessment & Plan Note (Addendum)
First reported oct 2021, onset 2 years prior, smoking hx, emphysema on CT and COPD per pft Lung mass found early 2022, BAL neg, PET reassuring, tx with prolonged levaquin tx with some improvement, abx December 2022, April 2023 Lisinopril stopped and replaced with valsartan Fish oil held GERD tx maximized with protonix BID Inhalers with various changes - last STIOLTO RESPIMAT and albuterol prn, but unable to afford meds now due to medicare "donut hole" She has continued productive cough with wheeze and SOB, chest hurts with bad coughing but no pleuritic cp, she notes baseline dyspnea has been unchanged overall (over past several years)

## 2021-10-21 NOTE — Assessment & Plan Note (Signed)
LE edema has been reported as chronic for many years, and has been managed on Lasix as needed prior to first visit in this EMR Patient endorses 3 months of worsening lower extremity edema that has moved up her legs, pitting, with leg discomfort but no rash or drainage She has not tried compression stockings/socks No particular diet efforts She has Lasix 40 mg -this dose was recently increased but she has not taken it because of the urinary frequency side effect Very difficult to get a good history with multiple upper GI and respiratory symptoms are chronic and poorly controlled but she states she has no orthopnea -she does prop herself up for sleeping because of reflux and postnasal drip and congestion, she has dyspnea on exertion which is unchanged from her baseline, she denies any weight changes and this is confirmed with her weights here today very stable, she denies chest pain with exertion only has chest wall and rib discomfort with her coughing, she denies palpitations, PND, near-syncope, diaphoresis I do not believe that she is ever been diagnosed with CHF but it was somewhere on the chart There is no echo in chart -  She reports seeing a cardiologist many years ago and at that time had no heart disease She does have advanced cirrhosis on imaging but has not seen GI in several years, no recent LFT abnormality  Plan to recheck labs - renal function, LFT, screen urine and microalbumin, BNP, have her consult with cardiology  Do low salt diet, compression socks/stockings, elevate legs, and take her lasix for a few days.  Currently not other sx concerning for CHF but she needs cardiology work up [employer] Also need GI f/up for cirrhosis

## 2021-10-21 NOTE — Assessment & Plan Note (Addendum)
Cirrhosis in care everywhere visit in 2018-2019 with [facility], she was seeing for GERD/gastritis, abd pain, and was subsequently lost to f/up Cirrhosis was not on problem list, LFTs have been WNL, recent CT noted advanced cirrhosis No recent LFT elevation Lab Results  Component Value Date   ALT 14 08/26/2021   AST 22 08/26/2021   ALKPHOS 47 08/23/2016   BILITOT 0.5 08/26/2021  pt will be referred back to [facility] for f/up on this and GERD

## 2021-10-21 NOTE — Assessment & Plan Note (Signed)
Ongoing sx for many years, prior GI management from 2018-2019 [facility], currently on PPI BID and still having sx Will refer back to GI for reestablishing care after being lost to f/up

## 2021-10-21 NOTE — Progress Notes (Signed)
Patient ID: Tamara [name], female    DOB: 1945/04/17, 76 y.o.   MRN: [id number]  PCP: Tamara Grana, PA-C  Chief Complaint  Patient presents with   Edema    Bilateral legs and feet for 3 months    Subjective:   Tamara [name] is a 76 y.o. female, presents to clinic with CC of the following:  HPI  Here for b/l LE edema, worse than her baseline x 3 months, with many years hx of le edema, she already has lasix 40 mg on chart Last routine OV f/ups were done by Tamara [name] in Jan, march and august - in august she had LE edema and lasix was changed from 20 mg to 40 mg She does follow with multiple specialists  She has hx of chronic lung disease left lower lobe ground glass opacities secondary to prolonged infectious/inflammatory changes per pulm, emphysema, CAD, and last CT chest noted hepatic cirrhosis - did labs and I see no f/up visit after 02/2017 with Tamara Headings B. Ellin Mayhew, PA-C [facility]   She has hx of LE edema (for years and stable when I first met her in 2019 managed on HCTZ) and dyspnea and cough which oct 2021 she mentioned for the first time - endorsing 2 years of sx - that led to pulmonary consult Cardiology referral or work up was not done for dyspnea She has CHF on chart hx, LE edema which was mild and managed with HCTZ, and no other cardiac hx.  Pt states she saw a cardiologist many years ago and had no heart issues On CT scan from 03/2020 there was 3 vessel CAD Only ECG done in ER or hospital setting that I can see with chart review Today she has much worse LE edema, going up her legs to knees, skin tight, no swelling anywhere else on her body, she believes her weight is the same, she denies any perceivable change to her baseline dyspnea and she denies orthopnea, palpitations chest pressure. She is having dyspnea on exertion and increased cough for several months and recently could not afford her inhalers  DM, HTN, HLD, obesity, COPD/emphysema  Which reviewed  and unchanged over the past couple months Wt Readings from Last 5 Encounters:  10/21/21 240 lb 4.8 oz (109 kg)  10/20/21 241 lb 11.2 oz (109.6 kg)  08/26/21 240 lb (108.9 kg)  08/18/21 243 lb 6.4 oz (110.4 kg)  08/11/21 240 lb 6.4 oz (109 kg)   BMI Readings from Last 5 Encounters:  10/21/21 38.79 kg/m  10/20/21 39.01 kg/m  08/26/21 38.74 kg/m  08/18/21 39.89 kg/m  08/11/21 38.80 kg/m   Emphysema/chronic lung disease/pneumonia, masslike consolidation, groundglass opacities in the left lower lobes from the beginning of 2022 still visible on most recent CT scan July 2023 Cough slightly increased over the past couple months and ran out of maintenance inhalers Dyspnea is about her baseline No orthopnea - she does prop herself up to sleep because of GERD and postnasal sx and congestion No palpitations Chest only hurts with coughing really hard 3 months of worse swelling not using lasix   Her blood pressure is well controlled on her current medications, valsartan and hydrochlorothiazide BP Readings from Last 3 Encounters:  10/21/21 136/74  10/20/21 (!) 144/70  08/26/21 134/68   Despite having lower extremity edema for many years she does not have and has never used compression socks In the last 3 months she has not been on steroids and has not had  any other new medications prescribed to her she did to start gabapentin than last 2 days for her chronic neck pain and cervical radiculopathy. 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 05/13/2020   Chronic cough 05/13/2020   COPD (chronic obstructive pulmonary disease) ([place]) 05/13/2020   Class 2 drug-induced obesity with body mass index (BMI) of 37.0  to 37.9 in adult 12/07/2017   Anemia of chronic disease 09/05/2017   Colon polyps 09/05/2017   Hypertension 07/14/2016   Hyperlipemia 07/14/2016   Bilateral lower extremity edema 07/14/2016   GERD (gastroesophageal reflux disease) 07/14/2016   Screening for colorectal cancer 07/14/2016      Current Outpatient Medications:    Accu-Chek Softclix Lancets lancets, TEST BLOOD SUGAR TWICE DAILY, Disp: 200 each, Rfl: 11   albuterol (PROVENTIL) (2.5 MG/3ML) 0.083% nebulizer solution, Take 3 mLs (2.5 mg total) by nebulization every 6 (six) hours as needed for wheezing or shortness of breath., Disp: 75 mL, Rfl: 5   albuterol (VENTOLIN HFA) 108 (90 Base) MCG/ACT inhaler, Inhale 2 puffs into the lungs every 6 (six) hours as needed for wheezing or shortness of breath., Disp: 8 g, Rfl: 6   Alcohol Swabs (B-D SINGLE USE SWABS REGULAR) PADS, USE  TO TEST BLOOD SUGAR TWICE DAILY, Disp: 200 each, Rfl: 3   Ascorbic Acid (VITAMIN C) 1000 MG tablet, Take 1,000 mg by mouth daily., Disp: , Rfl:    Cholecalciferol (VITAMIN D) 50 MCG (2000 UT) CAPS, Take 1,000 Units by mouth daily., Disp: , Rfl:    DROPLET PEN NEEDLES 32G X 4 MM MISC, 1 Device by Other route daily., Disp: 200 each, Rfl: 3   furosemide (LASIX) 40 MG tablet, Take 1 tablet (40 mg total) by mouth daily. (Patient taking differently: Take 40 mg by mouth daily as needed.), Disp: 30 tablet, Rfl: 1   gabapentin (NEURONTIN) 300 MG capsule, Take by mouth., Disp: , Rfl:    hydrochlorothiazide (HYDRODIURIL) 25 MG tablet, Take 1 tablet (25 mg total) by mouth daily., Disp: 90 tablet, Rfl: 1   Insulin NPH, Human,, Isophane, (NOVOLIN N FLEXPEN) 100 UNIT/ML Kiwkpen, Inject under skin 40 units each morning, and 24 units each evening, Disp: 60 mL, Rfl: 3   levothyroxine (SYNTHROID) 50 MCG tablet, Take 1 tablet (50 mcg total) by mouth daily before breakfast., Disp: 90 tablet, Rfl: 1   meloxicam (MOBIC) 15 MG tablet, TAKE 1 TABLET EVERY DAY, Disp: 30 tablet, Rfl: 0    metFORMIN (GLUCOPHAGE) 1000 MG tablet, Take 1 tablet (1,000 mg total) by mouth 2 (two) times daily., Disp: 180 tablet, Rfl: 3   methocarbamol (ROBAXIN) 500 MG tablet, TAKE 1 TABLET EVERY 8 HOURS AS NEEDED FOR MUSCLE SPASMS., Disp: 90 tablet, Rfl: 3   nystatin (MYCOSTATIN/NYSTOP) powder, Apply 1 application. topically daily as needed (yeast under belly)., Disp: 60 g, Rfl: 2   pantoprazole (PROTONIX) 40 MG tablet, TAKE 1 TABLET TWICE DAILY (Patient taking differently: 40 mg daily.), Disp: 180 tablet, Rfl: 1   potassium chloride SA (KLOR-CON M) 20 MEQ tablet, Take 1 tablet (20 mEq total) by mouth daily., Disp: 90 tablet, Rfl: 1   rosuvastatin (CRESTOR) 20 MG tablet, Take 1 tablet (20 mg total) by  mouth at bedtime., Disp: 90 tablet, Rfl: 1   TRUE METRIX BLOOD GLUCOSE TEST test strip, Use 2x a day, Disp: 200 each, Rfl: 3   valsartan (DIOVAN) 80 MG tablet, Take 1 tablet (80 mg total) by mouth daily., Disp: 90 tablet, Rfl: 3   dapagliflozin propanediol (FARXIGA) 5 MG TABS tablet, Take 1 tablet (5 mg total) by mouth daily. (Patient not taking: Reported on 10/20/2021), Disp: 90 tablet, Rfl: 3   fluconazole (DIFLUCAN) 150 MG tablet, Take 1 tablet (150 mg total) by mouth daily. (Patient not taking: Reported on 10/20/2021), Disp: 1 tablet, Rfl: 0   Tiotropium Bromide-Olodaterol (STIOLTO RESPIMAT) 2.5-2.5 MCG/ACT AERS, Inhale 2 puffs into the lungs daily. (Patient not taking: Reported on 10/20/2021), Disp: 4 g, Rfl: 5   Tiotropium Bromide-Olodaterol 2.5-2.5 MCG/ACT AERS, Inhale 2 puffs into the lungs daily. (Patient not taking: Reported on 10/20/2021), Disp: 4 g, Rfl: 6   Allergies  Allergen Reactions   Penicillins Hives    Reaction: 1964     Social History   Tobacco Use   Smoking status: Former    Packs/day: 1.00    Years: 40.00    Total pack years: 40.00    Types: Cigarettes    Quit date: 2006    Years since quitting: 17.7   Smokeless tobacco: Never   Tobacco comments:    Smoking cessation  materials not required  Vaping Use   Vaping Use: Never used  Substance Use Topics   Alcohol use: No   Drug use: No      Chart Review Today: I personally reviewed active problem list, medication list, allergies, family history, social history, health maintenance, notes from last encounter, lab results, imaging with the patient/caregiver today.   Review of Systems  Constitutional:  Positive for fatigue (generally tired all the time). Negative for activity change, appetite change, chills, diaphoresis, fever and unexpected weight change.  HENT: Negative.    Eyes: Negative.   Respiratory:  Positive for cough, shortness of breath and wheezing. Negative for apnea, choking and chest tightness.   Cardiovascular:  Positive for leg swelling. Negative for chest pain and palpitations.  Gastrointestinal: Negative.   Endocrine: Negative.   Genitourinary: Negative.   Musculoskeletal:  Positive for arthralgias.  Skin: Negative.   Allergic/Immunologic: Negative.   Neurological: Negative.   Hematological: Negative.   Psychiatric/Behavioral: Negative.    All other systems reviewed and are negative.      Objective:   Vitals:   10/21/21 1341  BP: 136/74  Pulse: 95  Resp: 16  Temp: 97.9 F (36.6 C)  TempSrc: Oral  SpO2: 94%  Weight: 240 lb 4.8 oz (109 kg)  Height: _0  (1.676 m)    Body mass index is 38.79 kg/m.  Physical Exam Vitals and nursing note reviewed.  Constitutional:      General: She is not in acute distress.    Appearance: Normal appearance. She is normal weight. She is not ill-appearing, toxic-appearing or diaphoretic.  HENT:     Head: Normocephalic and atraumatic.     Right Ear: External ear normal.     Left Ear: External ear normal.  Eyes:     General: No scleral icterus.       Right eye: No discharge.        Left eye: No discharge.     Conjunctiva/sclera: Conjunctivae normal.  Cardiovascular:     Rate and Rhythm: Normal rate and regular rhythm. No  extrasystoles are present.    Chest Wall: No thrill.  Pulses: Normal pulses.          Radial pulses are 2+ on the right side and 2+ on the left side.     Heart sounds: Normal heart sounds. Heart sounds not distant. No murmur heard.    No friction rub. No gallop.     Comments: Pretibial ankle and pedal pitting edema b/l Pulmonary:     Effort: Tachypnea (very mild increased work of breathing and rr with walking) present. No accessory muscle usage or respiratory distress.     Breath sounds: No stridor. Examination of the left-middle field reveals decreased breath sounds, wheezing and rhonchi. Examination of the left-lower field reveals decreased breath sounds, wheezing, rhonchi and rales. Decreased breath sounds, wheezing, rhonchi and rales present.  Abdominal:     General: Bowel sounds are normal.     Palpations: Abdomen is soft.  Musculoskeletal:     Right lower leg: 3+ Edema present.     Left lower leg: 3+ Edema present.  Neurological:     Mental Status: Mental status is at baseline.     Gait: Gait abnormal (antalgic).  Psychiatric:        Mood and Affect: Mood normal.        Behavior: Behavior normal.      Results for orders placed or performed in visit on 10/21/21  HM DIABETES EYE EXAM  Result Value Ref Range   HM Diabetic Eye Exam No Retinopathy No Retinopathy       Assessment & Plan:   Problem List Items Addressed This Visit     Bilateral lower extremity edema - Primary   LE edema has been reported as chronic for many years, and has been managed on Lasix as needed prior to first visit in this EMR Patient endorses 3 months of worsening lower extremity edema that has moved up her legs, pitting, with leg discomfort but no rash or drainage She has not tried compression stockings/socks No particular diet efforts She has Lasix 40 mg -this dose was recently increased but she has not taken it because of the urinary frequency side effect Very difficult to get a good history  with multiple upper GI and respiratory symptoms are chronic and poorly controlled but she states she has no orthopnea -she does prop herself up for sleeping because of reflux and postnasal drip and congestion, she has dyspnea on exertion which is unchanged from her baseline, she denies any weight changes and this is confirmed with her weights here today very stable, she denies chest pain with exertion only has chest wall and rib discomfort with her coughing, she denies palpitations, PND, near-syncope, diaphoresis I do not believe that she is ever been diagnosed with CHF but it was somewhere on the chart There is no echo in chart -  She reports seeing a cardiologist many years ago and at that time had no heart disease - will need cardiac eval for CAD and CHF, doing BNP again today (done and negative in the past) She does have advanced cirrhosis on imaging but has not seen GI in several years, no recent LFT abnormality  Plan to recheck labs - renal function, LFT, screen urine and microalbumin, BNP, have her consult with cardiology  Do low salt diet, compression socks/stockings, elevate legs, and take her lasix for a few days. If LE edema is not related to any CHF - may need vascular eval if conservative measures fail.   Currently not other sx concerning for CHF but she needs cardiology work  up anyways Also need GI f/up for cirrhosis     Relevant Orders  COMPLETE METABOLIC PANEL WITH GFR  Brain natriuretic peptide  Ambulatory referral to Cardiology         Cardiovascular and Mediastinum   Hypertension    bp stable and fairly well controlled today on valsartan and HCTZ      Coronary artery disease involving native heart    04/16/2020 CT scan finding - Three-vessel coronary artery calcifications On statin HLD poorly controlled, uncontrolled IDDM, HTN fairly well controlled, chronic lung disease/emphysema, prior smoker, morbid obesity, possible NASH Pt continues to have dyspnea, now  worsening LE edema, unclear if there is cardiac component since lung disease is still poorly controlled - refer to cardiology for consult/further assessment for this patient with considerable cardiac risk      Relevant Orders   Ambulatory referral to Cardiology   Aortic atherosclerosis [facility])   Relevant Orders   Ambulatory referral to Cardiology     Respiratory   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    Unchanged as of last chest CT - managed by pulmonology F/up CT July 2024         Digestive   GERD (gastroesophageal reflux disease)    Ongoing sx for many years, prior GI management from 2018-2019 [facility], currently on PPI BID and still having sx Will refer back to GI for reestablishing care after being lost to f/up      Relevant Orders   Ambulatory referral to Gastroenterology   Hepatic cirrhosis ([place])    Cirrhosis in care everywhere visit in 2018-2019 with [facility], she was seeing for GERD/gastritis, abd pain, and was subsequently lost to f/up Cirrhosis was not on problem list, LFTs have been WNL, recent CT noted advanced cirrhosis No recent LFT elevation Lab Results  Component Value Date   ALT 14 08/26/2021   AST 22 08/26/2021   ALKPHOS 47 08/23/2016   BILITOT 0.5 08/26/2021  pt will be referred back to [facility] for f/up on this and GERD       Relevant Orders   Ambulatory referral to Gastroenterology     Endocrine   Uncontrolled diabetes mellitus with hyperglycemia, with long-term current use of insulin ([place])    Uncontrolled T2 IDDM - managed by endocrinology on metformin and insulin  Could not afford farxiga Due for urine microalbumin testing today - otherwise she sees endocrinology, A1C reviewed, DM eye exam done and reviewed results today Lab Results  Component Value Date   HGBA1C 10.1 (A) 08/11/2021          Other   Hyperlipemia    Lipids poorly controlled, she has been on crestor for several years 20 mg dose Lab Results  Component Value Date    CHOL 185 08/26/2021   HDL 43 (L) 08/26/2021   LDLCALC 112 (H) 08/26/2021   TRIG 182 (H) 08/26/2021   CHOLHDL 4.3 08/26/2021   Would recommend increasing dose to 40 (looks like she had this previously) and recheck lipids and tolerance If unable to tolerate would con't 20 mg crestor and add zetia - I do have my concerns about this pts overall compliance          Anemia of chronic disease    Chronic anemia as far back as the chart goes It has been stable - it was noted to be secondary to chronic disease (not clear what disease) Hypothyroid is mild and well managed 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, 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 One prior iron panel was normal Hemoglobin  Date Value Ref Range Status  08/26/2021 10.9 (L) 11.7 - 15.5 g/dL Final  07/28/2020 10.7 (L) 11.7 - 15.5 g/dL Final  05/13/2020 11.1 (L) 12.0 - 15.0 g/dL Final  04/08/2020 11.2 (L) 12.0 - 15.0 g/dL Final  stable anemia unlikely to be contributing significantly to dyspnea and fatigue        Chronic cough    First reported oct 2021, onset 2 years prior, smoking hx, emphysema on CT and COPD per pft Lung mass found early 2022, BAL neg, PET reassuring, tx with prolonged levaquin tx with some improvement, abx December 2022, April 2023 Lisinopril stopped and replaced with valsartan Fish oil held GERD tx maximized with protonix BID Inhalers with various changes - last STIOLTO RESPIMAT and albuterol prn, but unable to afford meds now due to medicare "donut hole" She has continued productive cough with wheeze and SOB, chest hurts with bad coughing but no pleuritic cp, she notes baseline dyspnea has been unchanged overall (over past several years)   Encouraged to f/up with pulm about inhalers and sx      Class 2 severe obesity with serious comorbidity and body mass index (BMI) of 38.0 to 38.9 in adult [facility])   With multiple associated comorbidities including uncontrolled IDDM, HTN, HLD, COPD, GERD   Other Visit Diagnoses     Dyspnea,  unspecified type       chronic, ongoing for many years, 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     Tamara Grana, PA-C 10/21/21 2:20 PM

## 2021-10-21 NOTE — Assessment & Plan Note (Signed)
04/16/2020 CT scan finding - Three-vessel coronary artery calcifications On statin HLD poorly controlled, uncontrolled IDDM, HTN fairly well controlled, chronic lung disease/emphysema, prior smoker, morbid obesity, possible NASH Pt continues to have dyspnea, now worsening LE edema, unclear if there is cardiac component since lung disease is still poorly controlled - refer to cardiology for consult/further assessment for this patient with considerable cardiac risk

## 2021-10-21 NOTE — Assessment & Plan Note (Signed)
bp stable and fairly well controlled today on valsartan and HCTZ

## 2021-10-22 LAB — COMPLETE METABOLIC PANEL WITH GFR
AG Ratio: 1.9 (calc) (ref 1.0–2.5)
ALT: 16 U/L (ref 6–29)
AST: 21 U/L (ref 10–35)
Albumin: 4.5 g/dL (ref 3.6–5.1)
Alkaline phosphatase (APISO): 61 U/L (ref 37–153)
BUN/Creatinine Ratio: 17 (calc) (ref 6–22)
BUN: 17 mg/dL (ref 7–25)
CO2: 27 mmol/L (ref 20–32)
Calcium: 9.9 mg/dL (ref 8.6–10.4)
Chloride: 102 mmol/L (ref 98–110)
Creat: 1.03 mg/dL — ABNORMAL HIGH (ref 0.60–1.00)
Globulin: 2.4 g/dL (calc) (ref 1.9–3.7)
Glucose, Bld: 161 mg/dL — ABNORMAL HIGH (ref 65–99)
Potassium: 4.1 mmol/L (ref 3.5–5.3)
Sodium: 140 mmol/L (ref 135–146)
Total Bilirubin: 0.5 mg/dL (ref 0.2–1.2)
Total Protein: 6.9 g/dL (ref 6.1–8.1)
eGFR: 57 mL/min/{1.73_m2} — ABNORMAL LOW (ref >=60)

## 2021-10-22 LAB — MICROALBUMIN / CREATININE URINE RATIO
Creatinine, Urine: 117 mg/dL (ref 20–275)
Microalb Creat Ratio: 7 mcg/mg creat (ref <30)
Microalb, Ur: 0.8 mg/dL

## 2021-10-22 LAB — BRAIN NATRIURETIC PEPTIDE: Brain Natriuretic Peptide: 16 pg/mL (ref <100)

## 2021-10-25 DIAGNOSIS — J441 Chronic obstructive pulmonary disease with (acute) exacerbation: Secondary | ICD-10-CM | POA: Diagnosis not present

## 2021-10-25 DIAGNOSIS — R053 Chronic cough: Secondary | ICD-10-CM | POA: Diagnosis not present

## 2021-10-29 DIAGNOSIS — R202 Paresthesia of skin: Secondary | ICD-10-CM | POA: Diagnosis not present

## 2021-10-29 DIAGNOSIS — R0789 Other chest pain: Secondary | ICD-10-CM | POA: Diagnosis not present

## 2021-10-30 ENCOUNTER — Telehealth: Payer: Self-pay | Admitting: Family Medicine

## 2021-10-30 NOTE — Telephone Encounter (Signed)
Copied from [place] [id number]. Topic: General - Other >> Oct 30, 2021  9:12 AM Ludger Nutting wrote: [place] is faxing over a prescription request for diabetic supplies for patient.

## 2021-10-30 NOTE — Telephone Encounter (Signed)
Forms were received, another provider from different office prescribed them through [facility] mail service already.

## 2021-11-10 NOTE — Progress Notes (Unsigned)
Cardiology Office Note:    Date:  11/10/2021   ID:  Tamara [name], DOB 01/31/1945, MRN [id number]  PCP:  [name], [place] Providers Cardiologist:  None { Click to update primary MD,subspecialty MD or APP then REFRESH:1}    Referring MD: [name], PA-C   No chief complaint on file. ***  History of Present Illness:    Tamara [name] is a 76 y.o. female with a hx of ***  Past Medical History:  Diagnosis Date   Arthritis    knee and shoulders   Colon polyps 09/05/2017   Diabetes mellitus without complication ([place])    type II   Dyspnea    05/16/20 has had a cough for 2.5 years post Covid   Gastritis 09/05/2017   GERD (gastroesophageal reflux disease)    Hyperlipidemia    Hypertension    patient denies   Hypothyroidism    Panic attack    RUQ pain 03/09/2017   Per patient, she has had RUQ pain 4-5 years that has grown worse in the last few months.    Past Surgical History:  Procedure Laterality Date   BREAST BIOPSY Left 2008   CORE W/CLIP - NEG   BRONCHIAL BIOPSY  05/19/2020   Procedure: BRONCHIAL BIOPSIES;  Surgeon: Collene Gobble, MD;  Location: [facility] ENDOSCOPY;  Service: Pulmonary;;   BRONCHIAL BRUSHINGS  05/19/2020   Procedure: BRONCHIAL BRUSHINGS;  Surgeon: Collene Gobble, MD;  Location: [facility] ENDOSCOPY;  Service: Pulmonary;;   BRONCHIAL NEEDLE ASPIRATION BIOPSY  05/19/2020   Procedure: BRONCHIAL NEEDLE ASPIRATION BIOPSIES;  Surgeon: Collene Gobble, MD;  Location: [place];  Service: Pulmonary;;   BRONCHIAL WASHINGS  05/19/2020   Procedure: BRONCHIAL WASHINGS;  Surgeon: Collene Gobble, MD;  Location: [place];  Service: Pulmonary;;   COLONOSCOPY WITH PROPOFOL N/A 02/08/2017   Procedure: COLONOSCOPY WITH PROPOFOL;  Surgeon: Lollie Sails, MD;  Location: [facility] ENDOSCOPY;  Service: Endoscopy;  Laterality: N/A;   ESOPHAGOGASTRODUODENOSCOPY (EGD) WITH PROPOFOL N/A 02/08/2017   Procedure: ESOPHAGOGASTRODUODENOSCOPY (EGD) WITH  PROPOFOL;  Surgeon: Lollie Sails, MD;  Location: [facility] ENDOSCOPY;  Service: Endoscopy;  Laterality: N/A;   TUBAL LIGATION     VIDEO BRONCHOSCOPY WITH ENDOBRONCHIAL NAVIGATION N/A 05/19/2020   Procedure: VIDEO BRONCHOSCOPY WITH ENDOBRONCHIAL NAVIGATION;  Surgeon: Collene Gobble, MD;  Location: [place] ENDOSCOPY;  Service: Pulmonary;  Laterality: N/A;    Current Medications: No outpatient medications have been marked as taking for the 11/16/21 encounter (Appointment) with Freada Bergeron, MD.     Allergies:   Penicillins   Social History   Socioeconomic History   Marital status: Married    Spouse name: Lajean Boese   Number of children: 3   Years of education: Not on file   Highest education level: High school graduate  Occupational History   Occupation: retired  Tobacco Use   Smoking status: Former    Packs/day: 1.00    Years: 40.00    Total pack years: 40.00    Types: Cigarettes    Quit date: 2006    Years since quitting: 17.8   Smokeless tobacco: Never   Tobacco comments:    Smoking cessation materials not required  Vaping Use   Vaping Use: Never used  Substance and Sexual Activity   Alcohol use: No   Drug use: No   Sexual activity: Not on file  Other Topics Concern   Not on file  Social History Narrative   Not on file  Social Determinants of Health   Financial Resource Strain: Medium Risk (10/20/2021)   Overall Financial Resource Strain (CARDIA)    Difficulty of Paying Living Expenses: Somewhat hard  Food Insecurity: No Food Insecurity (10/20/2021)   Hunger Vital Sign    Worried About Running Out of Food in the Last Year: Never true    Ran Out of Food in the Last Year: Never true  Transportation Needs: No Transportation Needs (10/20/2021)   PRAPARE - Hydrologist (Medical): No    Lack of Transportation (Non-Medical): No  Physical Activity: Inactive (10/20/2021)   Exercise Vital Sign    Days of Exercise per Week: 0 days     Minutes of Exercise per Session: 0 min  Stress: Stress Concern Present (10/20/2021)   [place]    Feeling of Stress : To some extent  Social Connections: Moderately Integrated (10/20/2021)   Social Connection and Isolation Panel [NHANES]    Frequency of Communication with Friends and Family: More than three times a week    Frequency of Social Gatherings with Friends and Family: More than three times a week    Attends Religious Services: 1 to 4 times per year    Active Member of [employer] or Organizations: No    Attends Archivist Meetings: Never    Marital Status: Married     Family History: The patient's ***family history includes Diabetes in her maternal grandmother. There is no history of Breast cancer.  ROS:   Please see the history of present illness.    *** All other systems reviewed and are negative.  EKGs/Labs/Other Studies Reviewed:    The following studies were reviewed today: ***  EKG:  EKG is *** ordered today.  The ekg ordered today demonstrates ***  Recent Labs: 08/26/2021: Hemoglobin 10.9; Platelets 164; TSH 3.34 10/21/2021: ALT 16; Brain Natriuretic Peptide 16; BUN 17; Creat 1.03; Potassium 4.1; Sodium 140  Recent Lipid Panel    Component Value Date/Time   CHOL 185 08/26/2021 1010   TRIG 182 (H) 08/26/2021 1010   HDL 43 (L) 08/26/2021 1010   CHOLHDL 4.3 08/26/2021 1010   VLDL 29 08/23/2016 0825   LDLCALC 112 (H) 08/26/2021 1010     Risk Assessment/Calculations:   {Does this patient have ATRIAL FIBRILLATION?[phone]}  No BP recorded.  {Refresh Note OR Click here to enter BP  :1}***         Physical Exam:    VS:  There were no vitals taken for this visit.    Wt Readings from Last 3 Encounters:  10/21/21 240 lb 4.8 oz (109 kg)  10/20/21 241 lb 11.2 oz (109.6 kg)  08/26/21 240 lb (108.9 kg)     GEN: *** Well nourished, well developed in no acute distress HEENT:  Normal NECK: No JVD; No carotid bruits LYMPHATICS: No lymphadenopathy CARDIAC: ***RRR, no murmurs, rubs, gallops RESPIRATORY:  Clear to auscultation without rales, wheezing or rhonchi  ABDOMEN: Soft, non-tender, non-distended MUSCULOSKELETAL:  No edema; No deformity  SKIN: Warm and dry NEUROLOGIC:  Alert and oriented x 3 PSYCHIATRIC:  Normal affect   ASSESSMENT:    No diagnosis found. PLAN:    In order of problems listed above:  ***      {Are you ordering a CV Procedure (e.g. stress test, cath, DCCV, TEE, etc)?   Press F2        :[id number]}    Medication Adjustments/Labs and Tests Ordered: Current medicines  are reviewed at length with the patient today.  Concerns regarding medicines are outlined above.  No orders of the defined types were placed in this encounter.  No orders of the defined types were placed in this encounter.   There are no Patient Instructions on file for this visit.   Signed, Freada Bergeron, MD  11/10/2021 12:53 PM    [place]

## 2021-11-13 ENCOUNTER — Other Ambulatory Visit: Payer: Self-pay | Admitting: Family Medicine

## 2021-11-13 DIAGNOSIS — M25512 Pain in left shoulder: Secondary | ICD-10-CM

## 2021-11-16 ENCOUNTER — Encounter: Payer: Self-pay | Admitting: Cardiology

## 2021-11-16 ENCOUNTER — Encounter: Payer: Self-pay | Admitting: Internal Medicine

## 2021-11-16 ENCOUNTER — Ambulatory Visit: Payer: Medicare HMO | Attending: Cardiology | Admitting: Cardiology

## 2021-11-16 ENCOUNTER — Ambulatory Visit: Payer: Medicare HMO | Admitting: Internal Medicine

## 2021-11-16 VITALS — BP 124/56 | HR 96 | Ht 66.0 in | Wt 242.0 lb

## 2021-11-16 DIAGNOSIS — J449 Chronic obstructive pulmonary disease, unspecified: Secondary | ICD-10-CM | POA: Diagnosis not present

## 2021-11-16 DIAGNOSIS — I700 Atherosclerosis of aorta: Secondary | ICD-10-CM

## 2021-11-16 DIAGNOSIS — E1165 Type 2 diabetes mellitus with hyperglycemia: Secondary | ICD-10-CM

## 2021-11-16 DIAGNOSIS — I10 Essential (primary) hypertension: Secondary | ICD-10-CM | POA: Diagnosis not present

## 2021-11-16 DIAGNOSIS — R600 Localized edema: Secondary | ICD-10-CM | POA: Diagnosis not present

## 2021-11-16 DIAGNOSIS — R072 Precordial pain: Secondary | ICD-10-CM | POA: Diagnosis not present

## 2021-11-16 DIAGNOSIS — Z794 Long term (current) use of insulin: Secondary | ICD-10-CM

## 2021-11-16 DIAGNOSIS — R0602 Shortness of breath: Secondary | ICD-10-CM | POA: Diagnosis not present

## 2021-11-16 LAB — POCT GLYCOSYLATED HEMOGLOBIN (HGB A1C): Hemoglobin A1C: 7.7 % — AB (ref 4.0–5.6)

## 2021-11-16 MED ORDER — EZETIMIBE 10 MG PO TABS: 10.0000 mg | ORAL_TABLET | Freq: Every day | ORAL | 3 refills | Status: DC

## 2021-11-16 MED ORDER — NOVOLIN N FLEXPEN 100 UNIT/ML ~~LOC~~ SUPN: PEN_INJECTOR | SUBCUTANEOUS | 3 refills | Status: DC

## 2021-11-16 MED ORDER — POTASSIUM CHLORIDE CRYS ER 20 MEQ PO TBCR: 20.0000 meq | EXTENDED_RELEASE_TABLET | Freq: Every day | ORAL | 1 refills | Status: DC

## 2021-11-16 MED ORDER — METOPROLOL TARTRATE 100 MG PO TABS: 100.0000 mg | ORAL_TABLET | Freq: Once | ORAL | 0 refills | Status: DC
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
## 2021-11-16 NOTE — Patient Instructions (Signed)
Medication Instructions:   START TAKING ZETIA 10 MG BY MOUTH DAILY  INCREASE YOUR POTASSIUM CHLORIDE TO TAKING 20 mEq BY MOUTH DAILY AND YOU MAY TAKE AN EXTRA TABLET (20 mEq TOTAL) BY MOUTH AS NEEDED WHEN YOU ADMINISTER YOUR FUROSEMIDE (LASIX)  *If you need a refill on your cardiac medications before your next appointment, please call your pharmacy*   Lab Work:  NEXT [place] OFFICE--ON 11/27/21--CHECK BMET AT THAT TIME  If you have labs (blood work) drawn today and your tests are completely normal, you will receive your results only by: [place] (if you have MyChart) OR A paper copy in the mail If you have any lab test that is abnormal or we need to change your treatment, we will call you to review the results.   Testing/Procedures:  Your physician has requested that you have an echocardiogram. Echocardiography is a painless test that uses sound waves to create images of your heart. It provides your doctor with information about the size and shape of your heart and how well your heart's chambers and valves are working. This procedure takes approximately one hour. There are no restrictions for this procedure. Please do NOT wear cologne, perfume, aftershave, or lotions (deodorant is allowed). Please arrive 15 minutes prior to your appointment time.      Your cardiac CT will be scheduled at one of the below locations:   [facility] [address] [place], [place] [address] [phone]  If scheduled at [facility], please arrive at the [facility] and Children's Entrance (Entrance C2) of [facility] 30 minutes prior to test start time. You can use the FREE valet parking offered at entrance C (encouraged to control the heart rate for the test)  Proceed to the [facility] Radiology Department (first floor) to check-in and test prep.  All radiology patients and guests should use entrance C2 at [facility], accessed from [facility], even though the hospital's physical address listed is [address]       Please follow these instructions carefully (unless otherwise directed):  On the Night Before the Test: Be sure to Drink plenty of water. Do not consume any caffeinated/decaffeinated beverages or chocolate 12 hours prior to your test. Do not take any antihistamines 12 hours prior to your test.   On the Day of the Test: Drink plenty of water until 1 hour prior to the test. Do not eat any food 1 hour prior to test. You may take your regular medications prior to the test.  Take metoprolol 100 MG BY MOUTH (Lopressor) two hours prior to test. HOLD Furosemide/Hydrochlorothiazide morning of the test. FEMALES- please wear underwire-free bra if available, avoid dresses & tight clothing        After the Test: Drink plenty of water. After receiving IV contrast, you may experience a mild flushed feeling. This is normal. On occasion, you may experience a mild rash up to 24 hours after the test. This is not dangerous. If this occurs, you can take Benadryl 25 mg and increase your fluid intake. If you experience trouble breathing, this can be serious. If it is severe call 911 IMMEDIATELY. If it is mild, please call our office. If you take any of these medications: Glipizide/Metformin, Avandament, Glucavance, please do not take 48 hours after completing test unless otherwise instructed.  We will call to schedule your test 2-4 weeks out understanding that some insurance companies will need an authorization prior  to the service being performed.   For non-scheduling related questions, please contact the cardiac imaging nurse navigator should you have any questions/concerns: Marchia Bond, Cardiac Imaging Nurse Navigator Gordy Clement, Cardiac Imaging Nurse Navigator [facility] Direct Office Dial: [phone]   For scheduling needs, including cancellations and  rescheduling, please call [place], [phone].     Follow-Up:  3 MONTHS WITH AN EXTENDER IN THE OFFICE    Important Information About Sugar

## 2021-11-16 NOTE — Addendum Note (Signed)
Addended by: Sarina Ill on: 11/16/2021 01:58 PM   Modules accepted: Orders
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
## 2021-11-16 NOTE — Patient Instructions (Signed)
Please continue: - Metformin 1000 mg 2x a day  Please change: - NPH 40 >> 36 units in am and 24 >> 28-30 units at bedtime  Please return in 3-4 months with your sugar log.

## 2021-11-19 DIAGNOSIS — M5412 Radiculopathy, cervical region: Secondary | ICD-10-CM | POA: Diagnosis not present

## 2021-11-19 DIAGNOSIS — M47812 Spondylosis without myelopathy or radiculopathy, cervical region: Secondary | ICD-10-CM | POA: Diagnosis not present

## 2021-11-20 ENCOUNTER — Encounter (HOSPITAL_COMMUNITY): Payer: Self-pay | Admitting: Emergency Medicine

## 2021-11-23 ENCOUNTER — Telehealth: Payer: Self-pay | Admitting: *Deleted

## 2021-11-23 NOTE — Telephone Encounter (Signed)
-----   Message from Lorenza Evangelist, RN sent at 11/20/2021  2:23 PM EDT ----- Regarding: RE: CARDIAC CT PER DR. Johney Frame Sched for 01/19/22 when ins starts over. Marchia Bond RN Navigator Cardiac Imaging [facility] Heart and Vascular Services [phone] Office  [phone] Cell   ----- Message ----- From: Nuala Alpha, LPN Sent: [id number]  12:15 PM EDT To: Ciro Backer; Nuala Alpha, LPN; # Subject: CARDIAC CT PER DR. Johney Frame                   Dr. Johney Frame ordered a cardiac ct on this pt for chest pain  Order is in  Pt will have a bmet on next Friday 11/10  Please schedule and shoot me the date thereafter?   Thanks [employer]

## 2021-11-25 ENCOUNTER — Telehealth: Payer: Self-pay | Admitting: Cardiology

## 2021-11-25 NOTE — Telephone Encounter (Signed)
Pt aware that for her echo this Friday there is no prep work, just show up at the appt time for her [place].  She is aware to stop by the lab after her echo as well.  She is aware that for her Cardiac CT on 01/19/22, that is where there are instructions for her to follow, as I provided her at the last OV.  She is aware she will need to take metoprolol tartrate 100 mg po for one dose 2 hours prior to the CT.    She states she picked the metoprolol up today and will hold onto it and take it on 01/19/22, 2 hours prior to Cardiac CT.  She said she still has her AVS from her 10/30 appt with [place], with the  instructions.   She is also aware that our [place] will call her a day or 2 prior to her CTA, and go over the instructions again with her over the phone.   Pt verbalized understanding and agrees with this plan.

## 2021-11-25 NOTE — Telephone Encounter (Signed)
Patient stated she will need to take medication prior to a procedure and would like to know for which upcoming procedure will she need to take the medication.

## 2021-11-26 ENCOUNTER — Telehealth: Payer: Self-pay

## 2021-11-26 NOTE — Progress Notes (Signed)
Chronic Care Management Pharmacy Assistant   Name: Tamara [name]  MRN: [id number] DOB: 11/16/1945  Reason for Encounter: Diabetes Disease State Call   Recent office visits:  10/21/2021 [name], PA-C (PCP Office Visit) for Edema- Stopped: Wilder Glade due to patient not taking due to price cost Fluconazole 150 mg due to patient not taking, Lab orders placed, Referral to Gastroenterology placed, Referral to Cardiology placed  10/20/2021 Donnie Mesa, [place] (PCP [facility] Support) for Medicare Wellness Exam- No medication changes noted, No orders placed, BP 144/70  Recent consult visits:  11/16/2021 Philemon Kingdom, MD (Endocrinology) for Follow-up-  Changed: Insulin NPH Human 100 unit Inject 36 units each morning, 28-30 units each evening, Lab order placed, Follow-up in 3-4 months  11/16/2021 Gwyndolyn Kaufman, MD (Cardiology) for Precordial Pain- Started: Ezetimibe 10 mg daily, Metoprolol Tartrate 100 mg once, Take 90-120 mins prior to scan, Changed: Potassium Chloride ER 20 MEQ take one extra tablet prn when you administer furosemide, CT ordered, EKG 12-Lead ordered, Echocardiogram complete ordered., Patient to follow-up in 3 months  Hospital visits:  None in previous 6 months  Medications: Outpatient Encounter Medications as of 11/26/2021  Medication Sig   Accu-Chek Softclix Lancets lancets TEST BLOOD SUGAR TWICE DAILY   albuterol (PROVENTIL) (2.5 MG/3ML) 0.083% nebulizer solution Take 3 mLs (2.5 mg total) by nebulization every 6 (six) hours as needed for wheezing or shortness of breath.   albuterol (VENTOLIN HFA) 108 (90 Base) MCG/ACT inhaler Inhale 2 puffs into the lungs every 6 (six) hours as needed for wheezing or shortness of breath.   Alcohol Swabs (B-D SINGLE USE SWABS REGULAR) PADS USE  TO TEST BLOOD SUGAR TWICE DAILY   Ascorbic Acid (VITAMIN C) 1000 MG tablet Take 1,000 mg by mouth daily.   Cholecalciferol (VITAMIN D) 50 MCG (2000 UT) CAPS Take 1,000 Units by mouth daily.    DROPLET PEN NEEDLES 32G X 4 MM MISC 1 Device by Other route daily.   ezetimibe (ZETIA) 10 MG tablet Take 1 tablet (10 mg total) by mouth daily.   furosemide (LASIX) 40 MG tablet Take 1 tablet (40 mg total) by mouth daily.   gabapentin (NEURONTIN) 300 MG capsule Take by mouth.   hydrochlorothiazide (HYDRODIURIL) 25 MG tablet Take 1 tablet (25 mg total) by mouth daily.   Insulin NPH, Human,, Isophane, (NOVOLIN N FLEXPEN) 100 UNIT/ML Kiwkpen Inject under skin 36 units each morning, and 28-30 units each evening   levothyroxine (SYNTHROID) 50 MCG tablet Take 1 tablet (50 mcg total) by mouth daily before breakfast.   meloxicam (MOBIC) 15 MG tablet Take 0.5-1 tablets (7.5-15 mg total) by mouth daily as needed for pain.   metFORMIN (GLUCOPHAGE) 1000 MG tablet Take 1 tablet (1,000 mg total) by mouth 2 (two) times daily.   methocarbamol (ROBAXIN) 500 MG tablet TAKE 1 TABLET EVERY 8 HOURS AS NEEDED FOR MUSCLE SPASMS.   metoprolol tartrate (LOPRESSOR) 100 MG tablet Take 1 tablet (100 mg total) by mouth once for 1 dose. Take 90-120 minutes prior to scan.   nystatin (MYCOSTATIN/NYSTOP) powder Apply 1 application. topically daily as needed (yeast under belly).   pantoprazole (PROTONIX) 40 MG tablet TAKE 1 TABLET TWICE DAILY   potassium chloride SA (KLOR-CON M) 20 MEQ tablet Take 1 tablet (20 mEq total) by mouth daily. You may take an extra tablet (20 mEq total) by mouth as needed when you administer your furosemide (lasix).   rosuvastatin (CRESTOR) 20 MG tablet Take 1 tablet (20 mg total) by mouth at bedtime.  Tiotropium Bromide-Olodaterol (STIOLTO RESPIMAT) 2.5-2.5 MCG/ACT AERS Inhale 2 puffs into the lungs daily.   TRUE METRIX BLOOD GLUCOSE TEST test strip Use 2x a day   valsartan (DIOVAN) 80 MG tablet Take 1 tablet (80 mg total) by mouth daily.   No facility-administered encounter medications on file as of 11/26/2021.   Care Gaps: Mammogram  Star Rating Drugs: Metformin 1000 mg last filled on  09/28/2021 for a 90-Day supply with [place] Rosuvastatin 20 mg last filled on 09/13/2021 for a 90-Day supply with [place] Valsartan 80 mg last filled on 10/08/2021 for a 90-Day supply with [place]: Lab Results  Component Value Date/Time   HGBA1C 7.7 (A) 11/16/2021 01:57 PM   HGBA1C 10.1 (A) 08/11/2021 01:51 PM   HGBA1C 10.8 (H) 03/22/2019 10:20 AM   HGBA1C 8.2 (H) 11/22/2018 12:00 AM   MICROALBUR 0.8 10/21/2021 02:31 PM   MICROALBUR 0.5 09/05/2017 08:17 AM    Kidney Function Lab Results  Component Value Date/Time   CREATININE 1.03 (H) 10/21/2021 02:31 PM   CREATININE 1.01 (H) 08/26/2021 10:10 AM   GFR 59.43 (L) 05/13/2020 02:38 PM   GFRNONAA 63 11/15/2019 03:23 PM   GFRAA 73 11/15/2019 03:23 PM   Current antihyperglycemic regimen:  Metformin 1000 mg 1 table twice daily Novolin Inject 36 units each morning,  and 28-30 units each evening  What recent interventions/DTPs have been made to improve glycemic control:  Endocrinology advised that patient should start [place] but she hasn't due to cost. We will try for PAP today.  Have there been any recent hospitalizations or ED visits since last visit with CPP? No Patient denies hypoglycemic symptoms, including Pale, Sweaty, Shaky, Hungry, Nervous/irritable, and Vision changes  Patient denies hyperglycemic symptoms, including blurry vision, excessive thirst, fatigue, polyuria, and weakness  How often are you checking your blood sugar? once daily  What are your blood sugars ranging?  Fasting: 155  During the week, how often does your blood glucose drop below 70? Never Are you checking your feet daily/regularly?   Adherence Review: Is the patient currently on a STATIN medication? Yes Is the patient currently on ACE/ARB medication? Yes Does the patient have >5 day gap between last estimated fill dates? No  I spoke with the patient, and she reports that she is doing well. Patient  stated that the swelling in her legs have decreased. Patient reports that her A1C has dropped to 7.7. Patient's Endocrinologist is suggesting that she starts [place] but patient declined due to cost.  I was going to assist her with patient assistance for this medication. I would apply for this medication via the [place] website, but the patient would need to provide me with her Medicare number in order for me to submit the application. Per patient she doesn't know her Medicare number, and at this time doesn't know where her card is. I instructed patient that if she finds the card she can give me a call back so we can proceed with the application.  Patient has a follow-up appointment with CPP on 01/13/2022 @ 1500.  Lynann Bologna, CPA/CMA Clinical Pharmacist Assistant Phone: [phone]

## 2021-11-27 ENCOUNTER — Telehealth: Payer: Self-pay

## 2021-11-27 ENCOUNTER — Other Ambulatory Visit: Payer: Self-pay | Admitting: Internal Medicine

## 2021-11-27 ENCOUNTER — Ambulatory Visit (HOSPITAL_COMMUNITY): Payer: Medicare HMO | Attending: Cardiology

## 2021-11-27 ENCOUNTER — Ambulatory Visit (HOSPITAL_BASED_OUTPATIENT_CLINIC_OR_DEPARTMENT_OTHER): Payer: Medicare HMO

## 2021-11-27 DIAGNOSIS — R072 Precordial pain: Secondary | ICD-10-CM | POA: Diagnosis not present

## 2021-11-27 DIAGNOSIS — R600 Localized edema: Secondary | ICD-10-CM | POA: Insufficient documentation

## 2021-11-27 DIAGNOSIS — R0602 Shortness of breath: Secondary | ICD-10-CM | POA: Insufficient documentation

## 2021-11-27 LAB — ECHOCARDIOGRAM COMPLETE
Area-P 1/2: 5.42 cm2
S' Lateral: 2.7 cm

## 2021-11-27 MED ORDER — DAPAGLIFLOZIN PROPANEDIOL 5 MG PO TABS: 5.0000 mg | ORAL_TABLET | Freq: Every day | ORAL | 3 refills | Status: DC

## 2021-11-27 NOTE — Progress Notes (Signed)
    Chronic Care Management Pharmacy Assistant   Name: KYM SCANNELL  MRN: [id number] DOB: 28-Oct-1945  Reason for Encounter: Patient Assistance Application for Farxiga   I completed the patient assistance application via [employer] online site, and the patient was approved and enrolled into the program until 01/18/2023.  I left a voicemail for Dr. Arman Filter medical assistant asking her to have Dr. Cruzita Lederer fax over a prescription for the [place] to [employer]. I left my direct number of [phone] for the CMA to call me back with any questions.  Patient has been notified of her approval.  Medications: Outpatient Encounter Medications as of 11/27/2021  Medication Sig   Accu-Chek Softclix Lancets lancets TEST BLOOD SUGAR TWICE DAILY   albuterol (PROVENTIL) (2.5 MG/3ML) 0.083% nebulizer solution Take 3 mLs (2.5 mg total) by nebulization every 6 (six) hours as needed for wheezing or shortness of breath.   albuterol (VENTOLIN HFA) 108 (90 Base) MCG/ACT inhaler Inhale 2 puffs into the lungs every 6 (six) hours as needed for wheezing or shortness of breath.   Alcohol Swabs (B-D SINGLE USE SWABS REGULAR) PADS USE  TO TEST BLOOD SUGAR TWICE DAILY   Ascorbic Acid (VITAMIN C) 1000 MG tablet Take 1,000 mg by mouth daily.   Cholecalciferol (VITAMIN D) 50 MCG (2000 UT) CAPS Take 1,000 Units by mouth daily.   DROPLET PEN NEEDLES 32G X 4 MM MISC 1 Device by Other route daily.   ezetimibe (ZETIA) 10 MG tablet Take 1 tablet (10 mg total) by mouth daily.   furosemide (LASIX) 40 MG tablet Take 1 tablet (40 mg total) by mouth daily.   gabapentin (NEURONTIN) 300 MG capsule Take by mouth.   hydrochlorothiazide (HYDRODIURIL) 25 MG tablet Take 1 tablet (25 mg total) by mouth daily.   Insulin NPH, Human,, Isophane, (NOVOLIN N FLEXPEN) 100 UNIT/ML Kiwkpen Inject under skin 36 units each morning, and 28-30 units each evening   levothyroxine (SYNTHROID) 50 MCG tablet Take 1 tablet (50 mcg total) by mouth daily before  breakfast.   meloxicam (MOBIC) 15 MG tablet Take 0.5-1 tablets (7.5-15 mg total) by mouth daily as needed for pain.   metFORMIN (GLUCOPHAGE) 1000 MG tablet Take 1 tablet (1,000 mg total) by mouth 2 (two) times daily.   methocarbamol (ROBAXIN) 500 MG tablet TAKE 1 TABLET EVERY 8 HOURS AS NEEDED FOR MUSCLE SPASMS.   metoprolol tartrate (LOPRESSOR) 100 MG tablet Take 1 tablet (100 mg total) by mouth once for 1 dose. Take 90-120 minutes prior to scan.   nystatin (MYCOSTATIN/NYSTOP) powder Apply 1 application. topically daily as needed (yeast under belly).   pantoprazole (PROTONIX) 40 MG tablet TAKE 1 TABLET TWICE DAILY   potassium chloride SA (KLOR-CON M) 20 MEQ tablet Take 1 tablet (20 mEq total) by mouth daily. You may take an extra tablet (20 mEq total) by mouth as needed when you administer your furosemide (lasix).   rosuvastatin (CRESTOR) 20 MG tablet Take 1 tablet (20 mg total) by mouth at bedtime.   Tiotropium Bromide-Olodaterol (STIOLTO RESPIMAT) 2.5-2.5 MCG/ACT AERS Inhale 2 puffs into the lungs daily.   TRUE METRIX BLOOD GLUCOSE TEST test strip Use 2x a day   valsartan (DIOVAN) 80 MG tablet Take 1 tablet (80 mg total) by mouth daily.   No facility-administered encounter medications on file as of 11/27/2021.    Lynann Bologna, CPA/CMA Clinical Pharmacist Assistant Phone: [phone]

## 2021-11-27 NOTE — Telephone Encounter (Signed)
Lvm requesting a rx for Farxiga be faxed to [employer] patient assistance. Pt application was approved they just need a current rx.

## 2021-11-27 NOTE — Telephone Encounter (Signed)
OK - I printed it

## 2021-11-27 NOTE — Telephone Encounter (Signed)
Faxed to [employer]

## 2021-11-28 LAB — BASIC METABOLIC PANEL
BUN/Creatinine Ratio: 20 (ref 12–28)
BUN: 27 mg/dL (ref 8–27)
CO2: 25 mmol/L (ref 20–29)
Calcium: 9.9 mg/dL (ref 8.7–10.3)
Chloride: 98 mmol/L (ref 96–106)
Creatinine, Ser: 1.32 mg/dL — ABNORMAL HIGH (ref 0.57–1.00)
Glucose: 174 mg/dL — ABNORMAL HIGH (ref 70–99)
Potassium: 4.6 mmol/L (ref 3.5–5.2)
Sodium: 138 mmol/L (ref 134–144)
eGFR: 42 mL/min/{1.73_m2} — ABNORMAL LOW (ref >59)

## 2021-12-02 ENCOUNTER — Telehealth: Payer: Self-pay

## 2021-12-02 NOTE — Telephone Encounter (Addendum)
Spoke with pt and advised of lab results per Dr Johney Frame.  Pt states she is taking Lasix '20mg'$  and KCL 43mq daily.  Advised Dr PJohney Framerecommends pt take the Lasix '20mg'$  and KCL 148m daily.  She reports she continues to have edema that resolves over night but returns during the day.    Pt also advised of echo results  Pt advised will forward to Dr PeJohney Frameo make her aware and will await further recommendations.  Pt verbalizes understanding and agrees with current plan.

## 2021-12-02 NOTE — Telephone Encounter (Addendum)
-----   Message from Freada Bergeron, MD sent at 11/28/2021  2:51 PM EST ----- Her echo shows vigorous pumping function and no significant valve disease. This is good news.   Freada Bergeron, MD  P Cv Div Ch St Triage Kidney function jumped a bit with the lasix. All other electrolytes look good. Is she taking the lasix daily? If so, let's back down to '20mg'$  daily and decrease potasium to 76mq daily.  How is her swelling?

## 2021-12-02 NOTE — Telephone Encounter (Signed)
Spoke with pt and advised of Dr Jacolyn Reedy recommendations as below.  Pt verbalizes understanding and agrees with current plan.

## 2021-12-14 ENCOUNTER — Other Ambulatory Visit: Payer: Self-pay | Admitting: Emergency Medicine

## 2021-12-21 ENCOUNTER — Encounter: Payer: Self-pay | Admitting: Family Medicine

## 2021-12-21 ENCOUNTER — Ambulatory Visit (INDEPENDENT_AMBULATORY_CARE_PROVIDER_SITE_OTHER): Payer: Medicare HMO | Admitting: Family Medicine

## 2021-12-21 VITALS — BP 136/70 | HR 84 | Temp 98.3°F | Resp 16 | Ht 66.0 in | Wt 236.6 lb

## 2021-12-21 DIAGNOSIS — J984 Other disorders of lung: Secondary | ICD-10-CM | POA: Diagnosis not present

## 2021-12-21 DIAGNOSIS — Z794 Long term (current) use of insulin: Secondary | ICD-10-CM

## 2021-12-21 DIAGNOSIS — J441 Chronic obstructive pulmonary disease with (acute) exacerbation: Secondary | ICD-10-CM | POA: Diagnosis not present

## 2021-12-21 DIAGNOSIS — E1165 Type 2 diabetes mellitus with hyperglycemia: Secondary | ICD-10-CM | POA: Diagnosis not present

## 2021-12-21 MED ORDER — IPRATROPIUM-ALBUTEROL 0.5-2.5 (3) MG/3ML IN SOLN: 3.0000 mL | Freq: Four times a day (QID) | RESPIRATORY_TRACT | 0 refills | Status: DC | PRN

## 2021-12-21 MED ORDER — PREDNISONE 20 MG PO TABS: 40.0000 mg | ORAL_TABLET | Freq: Every day | ORAL | 0 refills | Status: AC

## 2021-12-21 NOTE — Progress Notes (Signed)
Patient ID: Tamara [name], female    DOB: 06/20/1945, 76 y.o.   MRN: [id number]  PCP: [name], PA-C  Chief Complaint  Patient presents with   Cough    Onset for a week   Nasal Congestion   Fatigue    Subjective:   Tamara [name] is a 76 y.o. female, presents to clinic with CC of the following:  Cough    Patient presents with her husband both ill with similar symptoms nasal congestion, cough and fatigue They have not been around anyone sick.  She has some generalized fatigue, cough, feels tight and short of breath, no improvement with trying her albuterol inhaler or nebulizer only once a few days ago, she has otherwise tried Alka-Seltzer and Mucinex without improvement She has not updated her pneumococcal vaccine but did get a flu shot and there is no COVID vaccines documented.  Onset of symptoms was over a week ago and she declines to have any testing done today. She denies any weight gain, lower extremity edema, pleuritic chest pain. She does have a history of COPD, a stable mass in her left lower lung which she is seeing pulmonology for, last office visit was August with Dr. Lamonte Sakai at that time imaging and symptoms were stable after having recently switched to [place]. She reports that she currently cannot afford her inhaler and has not been able to for the past couple months due to being in the "donut hole" She denies palpitations, orthopnea, PND, lower extremity edema, chest pain, near syncope, fever, sweats, chills Again she is only tried albuterol either 2 puffs of the inhaler or 1 breathing treatment and she believes she was limited to only once a day if she needed it and the last time she tried it was several days ago. 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, Disp: 75 mL, Rfl: 5   albuterol (VENTOLIN HFA) 108 (90 Base) MCG/ACT inhaler, Inhale 2 puffs into the lungs every 6 (six) hours as needed for wheezing or shortness of breath., Disp: 8 g, Rfl: 6   Alcohol Swabs (B-D SINGLE USE SWABS REGULAR) PADS, USE  TO TEST BLOOD SUGAR TWICE DAILY, Disp: 200 each, Rfl: 3   Ascorbic Acid (VITAMIN C) 1000 MG tablet, Take 1,000 mg by mouth daily., Disp: , Rfl:    Cholecalciferol (VITAMIN D) 50 MCG (2000 UT) CAPS, Take 1,000 Units by  mouth daily., Disp: , Rfl:    dapagliflozin propanediol (FARXIGA) 5 MG TABS tablet, Take 1 tablet (5 mg total) by mouth daily before breakfast., Disp: 90 tablet, Rfl: 3   DROPLET PEN NEEDLES 32G X 4 MM MISC, 1 Device by Other route daily., Disp: 200 each, Rfl: 3   ezetimibe (ZETIA) 10 MG tablet, Take 1 tablet (10 mg total) by mouth daily., Disp: 90 tablet, Rfl: 3   furosemide (LASIX) 40 MG tablet, Take 1 tablet (40 mg total) by mouth daily., Disp: 30 tablet, Rfl: 1   gabapentin (NEURONTIN)  300 MG capsule, Take by mouth., Disp: , Rfl:    hydrochlorothiazide (HYDRODIURIL) 25 MG tablet, Take 1 tablet (25 mg total) by mouth daily., Disp: 90 tablet, Rfl: 1   Insulin NPH, Human,, Isophane, (NOVOLIN N FLEXPEN) 100 UNIT/ML Kiwkpen, Inject under skin 36 units each morning, and 28-30 units each evening, Disp: 60 mL, Rfl: 3   ipratropium-albuterol (DUONEB) 0.5-2.5 (3) MG/3ML SOLN, Take 3 mLs by nebulization every 6 (six) hours as needed., Disp: 180 mL, Rfl: 0   levothyroxine (SYNTHROID) 50 MCG tablet, Take 1 tablet (50 mcg total) by mouth daily before breakfast., Disp: 90 tablet, Rfl: 1   meloxicam (MOBIC) 15 MG tablet, Take 0.5-1 tablets (7.5-15 mg total) by mouth daily as needed for pain., Disp: 90 tablet, Rfl: 1   metFORMIN (GLUCOPHAGE) 1000 MG tablet, Take 1 tablet (1,000 mg total) by mouth 2 (two) times daily., Disp: 180 tablet, Rfl: 3   methocarbamol (ROBAXIN) 500 MG tablet, TAKE 1 TABLET EVERY 8 HOURS AS NEEDED FOR MUSCLE SPASMS., Disp: 90 tablet, Rfl: 3   nystatin (MYCOSTATIN/NYSTOP) powder, Apply 1 application. topically daily as needed (yeast under belly)., Disp: 60 g, Rfl: 2   pantoprazole (PROTONIX) 40 MG tablet, TAKE 1 TABLET TWICE DAILY, Disp: 180 tablet, Rfl: 3   potassium chloride SA (KLOR-CON M) 20 MEQ tablet, Take 1 tablet (20 mEq total) by mouth daily. You may take an extra tablet (20 mEq total) by mouth as needed when you administer your furosemide (lasix)., Disp: 135 tablet, Rfl: 1   predniSONE (DELTASONE) 20 MG tablet, Take 2 tablets (40 mg total) by mouth daily with breakfast for 5 days., Disp: 10 tablet, Rfl: 0   rosuvastatin (CRESTOR) 20 MG tablet, Take 1 tablet (20 mg total) by mouth at bedtime., Disp: 90 tablet, Rfl: 1   Tiotropium Bromide-Olodaterol (STIOLTO RESPIMAT) 2.5-2.5 MCG/ACT AERS, Inhale 2 puffs into the lungs daily., Disp: 4 g, Rfl: 5   TRUE METRIX BLOOD GLUCOSE TEST test strip, Use 2x a day, Disp: 200 each, Rfl: 3   valsartan (DIOVAN) 80 MG tablet, Take 1  tablet (80 mg total) by mouth daily., Disp: 90 tablet, Rfl: 3   metoprolol tartrate (LOPRESSOR) 100 MG tablet, Take 1 tablet (100 mg total) by mouth once for 1 dose. Take 90-120 minutes prior to scan., Disp: 1 tablet, Rfl: 0   Allergies  Allergen Reactions   Penicillins Hives    Reaction: 1965     Social History   Tobacco Use   Smoking status: Former    Packs/day: 1.00    Years: 40.00    Total pack years: 40.00    Types: Cigarettes    Quit date: 2006    Years since quitting: 17.9   Smokeless tobacco: Never   Tobacco comments:    Smoking cessation materials not required  Vaping Use   Vaping Use: Never used  Substance Use Topics   Alcohol use: No  Drug use: No      Chart Review Today: I personally reviewed active problem list, medication list, allergies, family history, social history, health maintenance, notes from last encounter, lab results, imaging with the patient/caregiver today.   Review of Systems  Constitutional: Negative.   HENT: Negative.    Eyes: Negative.   Respiratory:  Positive for cough.   Cardiovascular: Negative.   Gastrointestinal: Negative.   Endocrine: Negative.   Genitourinary: Negative.   Musculoskeletal: Negative.   Skin: Negative.   Allergic/Immunologic: Negative.   Neurological: Negative.   Hematological: Negative.   Psychiatric/Behavioral: Negative.    All other systems reviewed and are negative.      Objective:   Vitals:   12/21/21 1513 12/21/21 1527 12/21/21 1623  BP: (!) 150/80 136/70   Pulse: (!) 111  84  Resp: 16    Temp: 98.3 F (36.8 C)    TempSrc: Oral    SpO2: 93%    Weight: 236 lb 9.6 oz (107.3 kg)    Height: '5\' 6"'$  (1.676 m)      Body mass index is 38.19 kg/m.  Physical Exam Vitals and nursing note reviewed.  Constitutional:      General: She is not in acute distress.    Appearance: Normal appearance. She is well-developed. She is obese. She is not ill-appearing, toxic-appearing or diaphoretic.  HENT:      Head: Normocephalic and atraumatic.     Right Ear: Tympanic membrane, ear canal and external ear normal. There is no impacted cerumen.     Left Ear: Tympanic membrane, ear canal and external ear normal. There is no impacted cerumen.     Nose: Congestion and rhinorrhea present.     Mouth/Throat:     Mouth: Mucous membranes are moist.     Pharynx: Oropharynx is clear. Posterior oropharyngeal erythema present. No oropharyngeal exudate.  Eyes:     General: No scleral icterus.       Right eye: No discharge.        Left eye: No discharge.     Conjunctiva/sclera: Conjunctivae normal.  Neck:     Trachea: No tracheal deviation.  Cardiovascular:     Rate and Rhythm: Normal rate and regular rhythm.     Pulses: Normal pulses.     Heart sounds: Normal heart sounds.  Pulmonary:     Effort: Pulmonary effort is normal. No tachypnea, accessory muscle usage, respiratory distress or retractions.     Breath sounds: No stridor, decreased air movement or transmitted upper airway sounds. Wheezing and rales (LLL fine crackles/rales - not new) present. No decreased breath sounds or rhonchi.  Musculoskeletal:     Right lower leg: No edema.     Left lower leg: No edema.  Skin:    General: Skin is warm and dry.     Coloration: Skin is not jaundiced or pale.     Findings: No rash.  Neurological:     Mental Status: She is alert. Mental status is at baseline.     Motor: No abnormal muscle tone.     Coordination: Coordination normal.     Gait: Gait normal.  Psychiatric:        Mood and Affect: Mood normal.        Behavior: Behavior normal.      Results for orders placed or performed in visit on 11/27/21  ECHOCARDIOGRAM COMPLETE  Result Value Ref Range   Area-P 1/2 5.42 cm2   S' Lateral 2.70 cm  Assessment & Plan:   Pt with acute illness for about 1 week, husband is with her today with similar symptoms. Unfortunately her insurance is not currently paying for her maintenance inhaler which has  been prescribed by pulmonology and a few months ago was working well.  She has not been using any bronchodilators since getting sick.  She has some unchanged left lower lobe Rales/crackles with her known stable mass, otherwise pulmonary exam today she sounded very tight and wheezy with inspiration and expiration but without increased work of breathing, retractions or accessory muscle use. She and her husband were both uninterested in doing any testing for either flu or COVID. Spent a lot of time today looking for bronchodilators or inhaled corticosteroids that would be affordable that she could start using with this is acute exacerbation and could use perhaps in place of her Stiolto inhaler since she cannot get until next year. We looked up multiple medications together and coupons were printed and provided to her prior to leaving the clinic today I could not find any inhaled steroid even though I tried so she will need to do a few days of oral steroid burst and she was encouraged to used rescue inhalers/bronchodilators/nebulizer treatments more frequently when having an acute illness and increased COPD symptoms, and then when not ill she should not be having to use them often and should only be on her maintenance inhaler unless otherwise directed by pulmonology.  She additionally states today that the specialist was post to call her and arrange a follow-up appointment and I reviewed the chart and explained to her that she was instructed to make a 86-monthfollow-up visit with pulmonology and she has her CT follow-up around that time which would be in February.  The patient has had hyperglycemia previously when doing steroids, reviewed with the patient and her husband at length how to adjust insulin doses when needed to cover the steroid-induced hyperglycemia -and explained that the blood sugar should return to normal shortly after completing the steroid burst (a week or so) and the goal would be to prevent  her from having any blood sugars over 200 (she's poorly controlled and does often seem to not understand or be able to follow the medical recommendations - ie not using albuterol for acute sx, multiple times previously pt did not adjust insulin or f/up as advised and this tends to lead to unnecessary complications.)     [id number]  1. COPD with acute exacerbation (HCC)  J44.1 ipratropium-albuterol (DUONEB) 0.5-2.5 (3) MG/3ML SOLN    predniSONE (DELTASONE) 20 MG tablet   URI sx for a few days with cough and worse SOB/DOE - pt not on her maintenence inhalers due to cost/insurance    2. 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 Uncontrolled diabetes mellitus with hyperglycemia, with long-term current use of insulin (HCC)  E11.65    Z79.4    pt often refuses steroids for COPD exacerbation due to high blood sugars, reviewed how to manage temporary increased sugars     Pt has other findings of viral URI -nasal congestion edema or erythema and some mild posterior oropharyngeal injection -she can continue to use Mucinex or other over-the-counter cough suppressants.  [facility] too [facility] so they were not prescribed.  She was encouraged to use antihistamines over-the-counter like Zyrtec or Claritin which may also help manage her viral symptoms which will hopefully be self-limiting.  Encouraged both her and her husband to f/up if not starting to improve in the next 3-5 d They again were offered testing to find out what may have started their sx -even though they would be out of the window of treatment for COVID/Paxlovid or flu/Tamiflu.    Multiple meds were looked up, coupons printed, pharmacies and locations adjusted today to help get the pt meds they could afford. They were encouraged to reach out to a friend or family  member that may be able to assist them in task like this when they need to shop around for cheaper drug prices or alternate locations -since originally they only wanted to go to their local pharmacy which does not have any coupon programs and closed at 6 PM.  They do not have access to the Internet on their phones or computers that they felt that they could independently look up good Rx for medications, and they report being out of many of their current chronic and daily medications due to the "donut hole" which is a travesty and should not happen to pts who need to keep conditions well controlled the most during winter months/holidays.   Will connect with [facility] pharmacist and route chart to pulmonary to see if they have any other ideas on how to help the pt stay on her maintenance meds and prevent exacerbations.  F/up prn if not starting to improve in the next 3-5 days  [name], PA-C 12/21/21 6:29 PM

## 2022-01-13 ENCOUNTER — Ambulatory Visit (INDEPENDENT_AMBULATORY_CARE_PROVIDER_SITE_OTHER): Payer: Medicare HMO

## 2022-01-13 DIAGNOSIS — J449 Chronic obstructive pulmonary disease, unspecified: Secondary | ICD-10-CM

## 2022-01-13 DIAGNOSIS — I10 Essential (primary) hypertension: Secondary | ICD-10-CM

## 2022-01-13 NOTE — Progress Notes (Signed)
Chronic Care Management Pharmacy Note  01/14/2022 Name:  Tamara [name] MRN:  [id number] DOB:  01/20/1945  Summary: Patient presents for CCM follow-up. Feels her COPD exacerbation is improving. Patient is still coughing, reports intermittent chest pain. She did not take her prednisone and is upset she did not receive any antibiotics for her symptoms. She is not taking her Stiolto due to cost.   Recommendations/Changes made from today's visit: -Continue current medications -Start PAP for Stiolto  -Patient to reach out to pulmonology/ PCP if symptoms do not improve.   Plan: HC COPD assessment in one week.  CPP follow-up 3 month  Subjective: Tamara [name] is an 76 y.o. year old female who is a primary patient of [name], [place].  The CCM team was consulted for assistance with disease management and care coordination needs.    Engaged with patient by telephone for follow up visit in response to provider referral for pharmacy case management and/or care coordination services.   Consent to Services:  The patient was given information about Chronic Care Management services, agreed to services, and gave verbal consent prior to initiation of services.  Please see initial visit note for detailed documentation.   Patient Care Team: [name], PA-C as PCP - General (Family Medicine) Germaine Pomfret, [facility] as Pharmacist (Pharmacist)  Recent office visits: 12/21/21: Patient presented to [name], PA-C for COPD exacerbation.  06/01/21: Patient presented to [name], PA-C for follow-up.  05/01/21: Patient presented to [name], PA-C for URI.   Recent consult visits: 08/18/21: Patient presented to Dr. Lamonte Sakai (Pulmonology)  08/11/21: Patient presented to Dr. Cruzita Lederer (Endocrinology). Farxiga 5 mg daily  06/08/21: Patient presented to Rexene Edison, NP (COPD) for follow-up.  05/22/21: Patient presented to Rexene Edison, NP (COPD) for COPD exacerbation. Levofloxacin + prednisone.   05/14/21: Patient presented to Rexene Edison, NP (COPD) for COPD exacerbation  Hospital visits: None in past 6 months  Objective:  Lab Results  Component Value Date   CREATININE 1.32 (H) 11/27/2021   BUN 27 11/27/2021   GFR 59.43 (L) 05/13/2020   GFRNONAA 63 11/15/2019   GFRAA 73 11/15/2019   NA 138 11/27/2021   K 4.6 11/27/2021   CALCIUM 9.9 11/27/2021   CO2 25 11/27/2021   GLUCOSE 174 (H) 11/27/2021    Lab Results  Component Value Date/Time   HGBA1C 7.7 (A) 11/16/2021 01:57 PM   HGBA1C 10.1 (A) 08/11/2021 01:51 PM   HGBA1C 10.8 (H) 03/22/2019 10:20 AM   HGBA1C 8.2 (H) 11/22/2018 12:00 AM   GFR 59.43 (L) 05/13/2020 02:38 PM   MICROALBUR 0.8 10/21/2021 02:31 PM   MICROALBUR 0.5 09/05/2017 08:17 AM    Last diabetic Eye exam:  Lab Results  Component Value Date/Time   HMDIABEYEEXA No Retinopathy 10/06/2021 12:00 AM    Last diabetic Foot exam: No results found for: "HMDIABFOOTEX"   Lab Results  Component Value Date   CHOL 185 08/26/2021   HDL 43 (L) 08/26/2021   LDLCALC 112 (H) 08/26/2021   TRIG 182 (H) 08/26/2021   CHOLHDL 4.3 08/26/2021       Latest Ref Rng & Units 10/21/2021    2:31 PM 08/26/2021   10:10 AM 07/28/2020   10:26 AM  Hepatic Function  Total Protein 6.1 - 8.1 g/dL 6.9  6.9  6.9   AST 10 - 35 U/L '21  22  27   '$ ALT 6 - 29 U/L '16  14  20   '$ Total Bilirubin 0.2 - 1.2  mg/dL 0.5  0.5  0.5     Lab Results  Component Value Date/Time   TSH 3.34 08/26/2021 10:10 AM   TSH 3.88 07/28/2020 10:26 AM   FREET4 1.2 11/22/2018 12:00 AM       Latest Ref Rng & Units 08/26/2021   10:10 AM 07/28/2020   10:26 AM 05/13/2020    2:38 PM  CBC  WBC 3.8 - 10.8 Thousand/uL 5.7  6.4  7.6   Hemoglobin 11.7 - 15.5 g/dL 10.9  10.7  11.1   Hematocrit 35.0 - 45.0 % 33.2  34.5  33.1   Platelets 140 - 400 Thousand/uL 164  154  159.0     No results found for: "VD25OH"  Clinical ASCVD: No  The 10-year ASCVD risk score (Arnett DK, et al., 2019) is: 40%   Values used to  calculate the score:     Age: 76 years     Sex: Female     Is Non-Hispanic African American: No     Diabetic: Yes     Tobacco smoker: No     Systolic Blood Pressure: 342 mmHg     Is BP treated: Yes     HDL Cholesterol: 43 mg/dL     Total Cholesterol: 185 mg/dL       12/21/2021    3:12 PM 10/21/2021    1:35 PM 10/20/2021   11:14 AM  Depression screen PHQ 2/9  Decreased Interest 0 0 0  Down, Depressed, Hopeless 0 0 0  PHQ - 2 Score 0 0 0  Altered sleeping 0 2 2  Tired, decreased energy 0 3 3  Change in appetite 0 0 0  Feeling bad or failure about yourself  0 1 1  Trouble concentrating 0 3 3  Moving slowly or fidgety/restless 0 0 0  Suicidal thoughts 0 0 0  PHQ-9 Score 0 9 9  Difficult doing work/chores Not difficult at all Not difficult at all Somewhat difficult     Social History   Tobacco Use  Smoking Status Former   Packs/day: 1.00   Years: 40.00   Total pack years: 40.00   Types: Cigarettes   Quit date: 2006   Years since quitting: 18.0  Smokeless Tobacco Never  Tobacco Comments   Smoking cessation materials not required   BP Readings from Last 3 Encounters:  12/21/21 136/70  11/16/21 134/66  11/16/21 (!) 124/56   Pulse Readings from Last 3 Encounters:  12/21/21 84  11/16/21 (!) 102  11/16/21 96   Wt Readings from Last 3 Encounters:  12/21/21 236 lb 9.6 oz (107.3 kg)  11/16/21 241 lb 12.8 oz (109.7 kg)  11/16/21 242 lb (109.8 kg)   BMI Readings from Last 3 Encounters:  12/21/21 38.19 kg/m  11/16/21 39.03 kg/m  11/16/21 39.06 kg/m    Assessment/Interventions: Review of patient past medical history, allergies, medications, health status, including review of consultants reports, laboratory and other test data, was performed as part of comprehensive evaluation and provision of chronic care management services.   SDOH:  (Social Determinants of Health) assessments and interventions performed: Yes SDOH Interventions    Flowsheet Row Clinical Support  from 10/20/2021 in [facility] Most recent reading at 10/20/2021 11:16 AM Chronic Care Management from 01/28/2021 in [facility] Most recent reading at 02/02/2021  1:13 PM Chronic Care Management from 09/18/2020 in [facility] Most recent reading at 10/17/2020  7:58 AM Clinical Support from 10/16/2020 in [facility] Most  recent reading at 10/16/2020 11:43 AM Chronic Care Management from 05/28/2020 in [facility] Most recent reading at 05/28/2020 11:37 AM Chronic Care Management from 11/28/2019 in [facility] Most recent reading at 11/28/2019  2:51 PM  SDOH Interventions        Food Insecurity Interventions Intervention Not Indicated -- -- Intervention Not Indicated -- --  Housing Interventions Intervention Not Indicated -- -- Intervention Not Indicated -- --  Transportation Interventions Intervention Not Indicated -- -- Intervention Not Indicated -- Intervention Not Indicated  Utilities Interventions Intervention Not Indicated -- -- -- -- --  Financial Strain Interventions Intervention Not Indicated Intervention Not Indicated Intervention Not Indicated Intervention Not Indicated Intervention Not Indicated Intervention Not Indicated  Physical Activity Interventions Intervention Not Indicated -- -- Intervention Not Indicated -- --  Stress Interventions Intervention Not Indicated -- -- Intervention Not Indicated -- --  Social Connections Interventions Intervention Not Indicated -- -- Intervention Not Indicated -- --        [place]: No Food Insecurity (10/20/2021)  Housing: Low Risk  (10/20/2021)  Transportation Needs: No Transportation Needs (10/20/2021)  Utilities: Not At Risk (10/20/2021)  Alcohol Screen: Low Risk  (10/16/2020)  Depression (PHQ2-9): Low Risk  (12/21/2021)  Recent Concern: Depression (PHQ2-9) - Medium Risk (10/21/2021)  Financial Resource Strain:  Medium Risk (10/20/2021)  Physical Activity: Inactive (10/20/2021)  Social Connections: Moderately Integrated (10/20/2021)  Stress: Stress Concern Present (10/20/2021)  Tobacco Use: Medium Risk (12/21/2021)    CCM Care Plan  Allergies  Allergen Reactions   Penicillins Hives    Reaction: 1964    Medications Reviewed Today     Reviewed by Salomon Fick, [place] (Certified Medical Assistant) on 12/21/21 at 57  Med List Status: <None>   Medication Order Taking? Sig Documenting Provider Last Dose Status Informant  Accu-Chek Softclix Lancets lancets [id number] Yes TEST BLOOD SUGAR TWICE DAILY [name], PA-C Taking Active   albuterol (PROVENTIL) (2.5 MG/3ML) 0.083% nebulizer solution [id number] Yes Take 3 mLs (2.5 mg total) by nebulization every 6 (six) hours as needed for wheezing or shortness of breath. Parrett, Fonnie Mu, NP Taking Active   albuterol (VENTOLIN HFA) 108 (90 Base) MCG/ACT inhaler [id number] Yes Inhale 2 puffs into the lungs every 6 (six) hours as needed for wheezing or shortness of breath. Collene Gobble, MD Taking Active   Alcohol Swabs (B-D SINGLE USE SWABS REGULAR) PADS [id number] Yes USE  TO TEST BLOOD SUGAR TWICE DAILY Philemon Kingdom, MD Taking Active   Ascorbic Acid (VITAMIN C) 1000 MG tablet [id number] Yes Take 1,000 mg by mouth daily. [provider] Taking Active Self  Cholecalciferol (VITAMIN D) 50 MCG (2000 UT) CAPS [id number] Yes Take 1,000 Units by mouth daily. [provider] Taking Active Self  dapagliflozin propanediol (FARXIGA) 5 MG TABS tablet [id number] Yes Take 1 tablet (5 mg total) by mouth daily before breakfast. Philemon Kingdom, MD Taking Active   DROPLET PEN NEEDLES 32G X 4 MM MISC [id number] Yes 1 Device by Other route daily. Philemon Kingdom, MD Taking Active   ezetimibe (ZETIA) 10 MG tablet [id number] Yes Take 1 tablet (10 mg total) by mouth daily. Freada Bergeron, MD Taking Active   furosemide (LASIX) 40 MG tablet  [id number] Yes Take 1 tablet (40 mg total) by mouth daily. Teodora Medici, DO Taking Active   gabapentin (NEURONTIN) 300 MG capsule [id number] Yes Take by mouth. [provider] Taking Active   hydrochlorothiazide (HYDRODIURIL) 25 MG tablet [id number] Yes Take  1 tablet (25 mg total) by mouth daily. Teodora Medici, DO Taking Active   Insulin NPH, Human,, Isophane, (NOVOLIN N FLEXPEN) 100 UNIT/ML Mayer Masker [id number] Yes Inject under skin 36 units each morning, and 28-30 units each evening Philemon Kingdom, MD Taking Active   levothyroxine (SYNTHROID) 50 MCG tablet [id number] Yes Take 1 tablet (50 mcg total) by mouth daily before breakfast. Teodora Medici, DO Taking Active   meloxicam (MOBIC) 15 MG tablet [id number] Yes Take 0.5-1 tablets (7.5-15 mg total) by mouth daily as needed for pain. [name], PA-C Taking Active   metFORMIN (GLUCOPHAGE) 1000 MG tablet [id number] Yes Take 1 tablet (1,000 mg total) by mouth 2 (two) times daily. Philemon Kingdom, MD Taking Active   methocarbamol (ROBAXIN) 500 MG tablet [id number] Yes TAKE 1 TABLET EVERY 8 HOURS AS NEEDED FOR MUSCLE SPASMS. [name], PA-C Taking Active   metoprolol tartrate (LOPRESSOR) 100 MG tablet [id number]  Take 1 tablet (100 mg total) by mouth once for 1 dose. Take 90-120 minutes prior to scan. Freada Bergeron, MD  Active   nystatin (MYCOSTATIN/NYSTOP) powder [id number] Yes Apply 1 application. topically daily as needed (yeast under belly). [name], PA-C Taking Active   pantoprazole (PROTONIX) 40 MG tablet [id number] Yes TAKE 1 TABLET TWICE DAILY Byrum, Rose Fillers, MD Taking Active   potassium chloride SA (KLOR-CON M) 20 MEQ tablet [id number] Yes Take 1 tablet (20 mEq total) by mouth daily. You may take an extra tablet (20 mEq total) by mouth as needed when you administer your furosemide (lasix). Freada Bergeron, MD Taking Active   rosuvastatin (CRESTOR) 20 MG tablet [id number] Yes Take 1 tablet (20 mg total) by mouth  at bedtime. Teodora Medici, DO Taking Active   Tiotropium Bromide-Olodaterol (STIOLTO RESPIMAT) 2.5-2.5 MCG/ACT AERS [id number] Yes Inhale 2 puffs into the lungs daily. Collene Gobble, MD Taking Active   TRUE METRIX BLOOD GLUCOSE TEST test strip [id number] Yes Use 2x a day Philemon Kingdom, MD Taking Active   valsartan (DIOVAN) 80 MG tablet [id number] Yes Take 1 tablet (80 mg total) by mouth daily. 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 05/13/2020   Chronic cough 05/13/2020   COPD (chronic obstructive pulmonary disease) ([place]) 05/13/2020   Anemia of chronic disease 09/05/2017   Hypertension 07/14/2016   Hyperlipemia 07/14/2016   Bilateral lower extremity edema 07/14/2016   GERD (gastroesophageal reflux disease) 07/14/2016    Immunization History  Administered Date(s) Administered   Fluad Quad(high Dose 65+) 09/20/2018, 10/16/2019, 10/16/2020   Influenza, High Dose Seasonal PF 10/16/2019   Influenza,inj,Quad PF,6+ Mos 10/18/2016, 09/29/2017, 10/20/2021   Pneumococcal Conjugate-13 10/16/2019   Pneumococcal Polysaccharide-23 03/26/2013    Conditions to be addressed/monitored:  Hypertension, Hyperlipidemia, Diabetes, GERD and COPD  Care Plan : General Pharmacy (Adult)  Updates made by Germaine Pomfret, [place] since 01/14/2022 12:00 AM     Problem: Hypertension, Hyperlipidemia, Diabetes, GERD and COPD   Priority: High  Long-Range Goal: Patient-Specific Goal   Start Date: 05/28/2020  Expected End Date: 09/18/2022  This Visit's Progress: On track  Recent Progress: On track  Priority: High  Note:   Current Barriers:  Unable to achieve control of COPD  Unable to maintain control of blood pressure  Pharmacist Clinical Goal(s):  Patient will achieve control of COPD as evidenced by stable breathing per patient report maintain control of blood pressure as evidenced by BP less than 140/90  through collaboration with PharmD and provider.   Interventions: 1:1 collaboration with [name], PA-C regarding development and update of comprehensive plan of care as evidenced by provider attestation and co-signature Inter-disciplinary care team collaboration (see longitudinal plan of care) Comprehensive medication review performed; medication list updated in electronic medical record  Hypertension (BP goal <140/90) -Controlled -Current treatment: Furosmide 40 mg daily Hydrochlorothiazide 25 mg daily  Valsartan 80 mg daily  -Medications previously tried: NA  -Current home readings: Has not been routinely monitoring -Denies hypotensive/hypertensive symptoms -Educated on Daily salt intake goal < 2300 mg; Importance of home blood pressure monitoring; -Counseled to monitor BP at home weekly, document, and provide log at future appointments  Hyperlipidemia: (LDL goal < 70) -Controlled -Current treatment: Ezetimibe 10 mg daily  Rosuvastatin 20 mg daily  -Medications previously tried: NA  -Educated on Importance of limiting foods high in cholesterol; -Recommended to continue current medication  Diabetes (A1c goal <8%) -Controlled -Managed by Dr. Cruzita Lederer  -Current medications: Farxiga 5 mg daily  Metformin 1000 mg twice daily  NPH 36 units AM, 30 units nightly: Appropriate, Effective, Safe, Accessible -Medications previously tried:  -Current home glucose readings fasting glucose: averaging 135  Before  supper: Often forgets to check.  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:  B (high sx, <2 exacerbations/yr)  -Exacerbations requiring treatment in last 6 months: Yes 4/27 + 12/4  Feels her COPD exacerbation is improving. Patient is still coughing, reports intermittent chest pain. She did not take her prednisone and is upset she did not receive any antibiotics for her symptoms. She is not taking her Stiolto due to cost.  -Start PAP for [employer]  -Patient to reach out to pulmonology/ PCP if symptoms do not improve.   Hypothyroidism (Goal: Maintain stable thyroid function) -Controlled -Current treatment  Levothyroxine 25 mcg daily  -Medications previously tried: NA  -Recommended to continue current medication  GERD (Goal: Prevent heartburn/reflux) -Controlled -Current treatment  Pantoprazole 40 mg daily  -Medications previously tried: NA -Patient with chronic cough of unclear etiology. Per patient reflux symptoms have been well controlled.    -Recommended to continue current medication  Patient Goals/Self-Care Activities Patient will:  - check glucose twice daily; before breakfast and supper, document, and provide at future appointments check blood pressure three times weekly, document, and provide at future appointments  Follow Up Plan: Telephone follow up appointment with care management team member scheduled for:  03/10/2022 at 3:00 PM     Medication Assistance:  Will determine if  patient assistance necessary for inhaler treatments  Patient's preferred pharmacy is:  [facility], [place]  [place] [address] Phone: [phone] Fax: [phone]  [place], [place] - [place] [address] [place] [address] Phone: [phone] Fax: [phone]  [id number] [place] [address]: [phone]: [phone] Uses pill box? Yes Pt endorses 100% compliance  We discussed: Current pharmacy is preferred with insurance plan and patient is satisfied with pharmacy services Patient decided to: Continue current medication management strategy  Care Plan and Follow Up Patient Decision:  Patient agrees to Care Plan and Follow-up.  Plan: Telephone follow up appointment with care management team member scheduled for:  03/10/2022 at 3:00 PM  AMalva Limes CGlens FallsPharmacist Practitioner  [phone]

## 2022-01-14 NOTE — Patient Instructions (Signed)
Visit Information It was great speaking with you today!  Please let me know if you have any questions about our visit.   Goals Addressed             This Visit's Progress    Monitor and Manage My Blood Sugar-Diabetes Type 2   On track    Timeframe:  Long-Range Goal Priority:  High Start Date:  05/28/2020                            Expected End Date:  11/28/2021                     Follow Up within 90 days    -check blood sugar twice daily; before breakfast and before supper - check blood sugar if I feel it is too high or too low - enter blood sugar readings and medication or insulin into daily log - take the blood sugar meter to all doctor visits    Why is this important?   Checking your blood sugar at home helps to keep it from getting very high or very low.  Writing the results in a diary or log helps the doctor know how to care for you.  Your blood sugar log should have the time, date and the results.  Also, write down the amount of insulin or other medicine that you take.  Other information, like what you ate, exercise done and how you were feeling, will also be helpful.     Notes:         Patient Care Plan: General Pharmacy (Adult)     Problem Identified: Hypertension, Hyperlipidemia, Diabetes, GERD and COPD   Priority: High     Long-Range Goal: Patient-Specific Goal   Start Date: 05/28/2020  Expected End Date: 09/18/2022  This Visit's Progress: On track  Recent Progress: On track  Priority: High  Note:   Current Barriers:  Unable to achieve control of COPD  Unable to maintain control of blood pressure  Pharmacist Clinical Goal(s):  Patient will achieve control of COPD as evidenced by stable breathing per patient report maintain control of blood pressure as evidenced by BP less than 140/90  through collaboration with PharmD and provider.   Interventions: 1:1 collaboration with [name], PA-C regarding development and update of comprehensive plan of  care as evidenced by provider attestation and co-signature Inter-disciplinary care team collaboration (see longitudinal plan of care) Comprehensive medication review performed; medication list updated in electronic medical record  Hypertension (BP goal <140/90) -Controlled -Current treatment: Furosmide 40 mg daily Hydrochlorothiazide 25 mg daily  Valsartan 80 mg daily  -Medications previously tried: NA  -Current home readings: Has not been routinely monitoring -Denies hypotensive/hypertensive symptoms -Educated on Daily salt intake goal < 2300 mg; Importance of home blood pressure monitoring; -Counseled to monitor BP at home weekly, document, and provide log at future appointments  Hyperlipidemia: (LDL goal < 70) -Controlled -Current treatment: Ezetimibe 10 mg daily  Rosuvastatin 20 mg daily  -Medications previously tried: NA  -Educated on Importance of limiting foods high in cholesterol; -Recommended to continue current medication  Diabetes (A1c goal <8%) -Controlled -Managed by Dr. Cruzita Lederer  -Current medications: Farxiga 5 mg daily  Metformin 1000 mg twice daily  NPH 36 units AM, 30 units nightly: Appropriate, Effective, Safe, Accessible -Medications previously tried:  -Current home glucose readings fasting glucose: averaging 135  Before supper: Often forgets to check.  -Denies hypoglycemic/hyperglycemic symptoms -  Continue current medications  COPD (Goal: control symptoms and prevent exacerbations) -Uncontrolled -Current treatment  Albuterol nebulizer as needed: Appropriate, Effective, Safe, Accessible   Ventolin HFA 2 puffs every 6 hours as needed: Appropriate, Effective, Safe, Accessible  Stiolto 2 puffs daily   -Medications previously tried: Trelegy (worsened cough)  -Pulmonary function testing: FEV1 83%, FEV1/FVC 112%  mixed obstructive and restrictive lung disease -Current COPD Classification:  B (high sx, <2 exacerbations/yr)  -Exacerbations requiring  treatment in last 6 months: Yes 4/27 + 12/4  Feels her COPD exacerbation is improving. Patient is still coughing, reports intermittent chest pain. She did not take her prednisone and is upset she did not receive any antibiotics for her symptoms. She is not taking her Stiolto due to cost.  -Start PAP for [employer]  -Patient to reach out to pulmonology/ PCP if symptoms do not improve.   Hypothyroidism (Goal: Maintain stable thyroid function) -Controlled -Current treatment  Levothyroxine 25 mcg daily  -Medications previously tried: NA  -Recommended to continue current medication  GERD (Goal: Prevent heartburn/reflux) -Controlled -Current treatment  Pantoprazole 40 mg daily  -Medications previously tried: NA -Patient with chronic cough of unclear etiology. Per patient reflux symptoms have been well controlled.    -Recommended to continue current medication  Patient Goals/Self-Care Activities Patient will:  - check glucose twice daily; before breakfast and supper, document, and provide at future appointments check blood pressure three times weekly, document, and provide at future appointments  Follow Up Plan: Telephone follow up appointment with care management team member scheduled for:  03/10/2022 at 3:00 PM    Patient agreed to services and verbal consent obtained.   The patient verbalized understanding of instructions, educational materials, and care plan provided today and DECLINED offer to receive copy of patient instructions, educational materials, and care plan.   Malva Limes, [place] Pharmacist Practitioner  [employer] [phone]

## 2022-01-15 ENCOUNTER — Telehealth (HOSPITAL_COMMUNITY): Payer: Self-pay | Admitting: *Deleted

## 2022-01-15 NOTE — Telephone Encounter (Signed)
Attempted to call patient regarding upcoming cardiac CT appointment. Left message with family member with name and call back number for patient.  Gordy Clement RN Navigator Cardiac Imaging [facility] Services [phone] Office [phone] Cell

## 2022-01-15 NOTE — Telephone Encounter (Signed)
Patient returning call regarding her upcoming cardiac imaging study; pt verbalizes understanding of appt date/time, parking situation and where to check in, pre-test NPO status and medications ordered, and verified current allergies; name and call back number provided for further questions should they arise  Tamara Leidner RN Navigator Cardiac Imaging [facility] [phone] office [phone] cell  Patient to take 100mg metoprolol tartrate two hours prior to her cardiac CT scan. She is aware to arrive at 10:30am. 

## 2022-01-17 DIAGNOSIS — J449 Chronic obstructive pulmonary disease, unspecified: Secondary | ICD-10-CM

## 2022-01-17 DIAGNOSIS — I10 Essential (primary) hypertension: Secondary | ICD-10-CM

## 2022-01-19 ENCOUNTER — Telehealth: Payer: Self-pay | Admitting: *Deleted

## 2022-01-19 ENCOUNTER — Ambulatory Visit (HOSPITAL_COMMUNITY): Admission: RE | Admit: 2022-01-19 | Payer: Medicare HMO | Source: Ambulatory Visit

## 2022-01-19 NOTE — Telephone Encounter (Signed)
-----   Message from [place] sent at 01/19/2022 10:20 AM EST ----- Regarding: cta She has been r/s to 01/25/22 at 3:00

## 2022-01-22 ENCOUNTER — Telehealth (HOSPITAL_COMMUNITY): Payer: Self-pay | Admitting: *Deleted

## 2022-01-22 NOTE — Telephone Encounter (Signed)
Reaching out to patient to offer assistance regarding upcoming cardiac imaging study; pt's spouse answered phone. He states that she remembers her instructions from the last time we spoke to her. I let him know that if the patient does has questions, she can give me a call back. He verbalized understanding.  Gordy Clement RN Navigator Cardiac Imaging [facility] [phone] office [phone] cell

## 2022-01-25 ENCOUNTER — Ambulatory Visit (HOSPITAL_COMMUNITY)
Admission: RE | Admit: 2022-01-25 | Discharge: 2022-01-25 | Disposition: A | Discharge status: Home / Self Care | Payer: Medicare HMO | Source: Ambulatory Visit | Attending: Cardiology | Admitting: Cardiology

## 2022-01-25 DIAGNOSIS — R072 Precordial pain: Secondary | ICD-10-CM | POA: Diagnosis not present

## 2022-01-25 DIAGNOSIS — R931 Abnormal findings on diagnostic imaging of heart and coronary circulation: Secondary | ICD-10-CM | POA: Insufficient documentation

## 2022-01-25 DIAGNOSIS — R600 Localized edema: Secondary | ICD-10-CM | POA: Insufficient documentation

## 2022-01-25 DIAGNOSIS — R0602 Shortness of breath: Secondary | ICD-10-CM | POA: Diagnosis not present

## 2022-01-25 LAB — POCT I-STAT CREATININE: Creatinine, Ser: 1 mg/dL (ref 0.44–1.00)

## 2022-01-25 MED ORDER — METOPROLOL TARTRATE 5 MG/5ML IV SOLN
INTRAVENOUS | Status: AC
  Administered 2022-01-25: 5 mg via INTRAVENOUS
  Filled 2022-01-25: qty 5

## 2022-01-25 MED ORDER — NITROGLYCERIN 0.4 MG SL SUBL: 0.8000 mg | SUBLINGUAL_TABLET | SUBLINGUAL | Status: DC | PRN

## 2022-01-25 MED ORDER — METOPROLOL TARTRATE 5 MG/5ML IV SOLN
5.0000 mg | INTRAVENOUS | Status: DC | PRN
  Administered 2022-01-25: 5 mg via INTRAVENOUS

## 2022-01-25 MED ORDER — IOHEXOL 350 MG/ML SOLN
95.0000 mL | Freq: Once | INTRAVENOUS | Status: AC | PRN
  Administered 2022-01-25: 95 mL via INTRAVENOUS

## 2022-01-25 MED ORDER — NITROGLYCERIN 0.4 MG SL SUBL
SUBLINGUAL_TABLET | SUBLINGUAL | Status: AC
  Administered 2022-01-25: 0.8 mg via SUBLINGUAL
  Filled 2022-01-25: qty 2

## 2022-01-25 MED ORDER — METOPROLOL TARTRATE 5 MG/5ML IV SOLN
INTRAVENOUS | Status: AC
  Filled 2022-01-25: qty 5

## 2022-01-26 ENCOUNTER — Other Ambulatory Visit (HOSPITAL_COMMUNITY): Payer: Self-pay | Admitting: Emergency Medicine

## 2022-01-26 ENCOUNTER — Ambulatory Visit (HOSPITAL_COMMUNITY)
Admission: RE | Admit: 2022-01-26 | Discharge: 2022-01-26 | Disposition: A | Discharge status: Home / Self Care | Payer: Medicare HMO | Source: Ambulatory Visit | Attending: Cardiology | Admitting: Cardiology

## 2022-01-26 DIAGNOSIS — R931 Abnormal findings on diagnostic imaging of heart and coronary circulation: Secondary | ICD-10-CM | POA: Diagnosis not present

## 2022-01-26 DIAGNOSIS — R0602 Shortness of breath: Secondary | ICD-10-CM | POA: Diagnosis not present

## 2022-01-26 DIAGNOSIS — R600 Localized edema: Secondary | ICD-10-CM | POA: Diagnosis not present

## 2022-01-26 DIAGNOSIS — R072 Precordial pain: Secondary | ICD-10-CM | POA: Diagnosis not present

## 2022-01-28 ENCOUNTER — Telehealth: Payer: Self-pay | Admitting: Cardiology

## 2022-01-28 DIAGNOSIS — R931 Abnormal findings on diagnostic imaging of heart and coronary circulation: Secondary | ICD-10-CM

## 2022-01-28 DIAGNOSIS — E782 Mixed hyperlipidemia: Secondary | ICD-10-CM

## 2022-01-28 MED ORDER — ASPIRIN 81 MG PO TBEC: 81.0000 mg | DELAYED_RELEASE_TABLET | Freq: Every day | ORAL | 3 refills | Status: DC

## 2022-01-28 NOTE — Telephone Encounter (Signed)
-----   Message from Freada Bergeron, MD sent at 01/27/2022  8:57 PM EST ----- The CT scan of her heart arteries shows diffuse plaque throughout her heart arteries but no significant narrowings in the big arteries. There is a borderline significant narrowing in one of the blood vessels that provides flow to the bottom of the heart. This is unlikely driving her symptoms and appears by visual exam to be more mild to moderate narrowing. If she would like [place] to work this up further, we can do a myoview to assess for evidence of ischemia in that territory. Her Ca score is also very elevated at 1195. We need to drive her cholesterol lower. Given that she is on crestor and zetia, can we refer to lipid clinic to see if she is eligible for PCSK9i? Can we also ensure she is taking a ASA '81mg'$  daily?  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 Lamonte Sakai. She will continue to follow-up with him for this.

## 2022-01-28 NOTE — Telephone Encounter (Signed)
Called ans spoke in detail with patient. She states that she has been off her Crestor since starting the zetia because she believed it was to replace it. Explained that zetia was to be in conjunction with, not in place of, Crestor. She will start back. He understands the lipid clinic referral has been placed and is still necessary even though she's been off Crestor, d/t Coronary calcium score. She agrees to take aspirin '81mg'$  daily-MAR updated.  She states she's still seeing [facility] for lung mass and is due for another CT with them this year for it.

## 2022-02-11 ENCOUNTER — Telehealth: Payer: Self-pay | Admitting: Emergency Medicine

## 2022-02-11 NOTE — Telephone Encounter (Signed)
Made appt for her w/NP Gro[facility] for tomorrow. She wanted to speak to a nurse. Pls call @ [phone]  Cough Sinuses Bleeding Congestion Likely sinus infection  How many times can she use her nebulizer in a day?  [phone]

## 2022-02-11 NOTE — Telephone Encounter (Signed)
Spoke to pt and pt states she wanted to know how many times a day she could use her neb. I informed her every 6 hours. I also offered her an appt for today but pt stated she can not make it to the office today. Pt has an appt with Eric Form tomorrow. All pt's questions were answered pt verbalized understanding. Nothing further is needed.

## 2022-02-12 ENCOUNTER — Ambulatory Visit: Payer: Medicare HMO | Admitting: Acute Care

## 2022-02-12 ENCOUNTER — Encounter: Payer: Self-pay | Admitting: Acute Care

## 2022-02-12 VITALS — BP 132/66 | HR 97 | Temp 98.4°F | Ht 66.0 in | Wt 235.4 lb

## 2022-02-12 DIAGNOSIS — J441 Chronic obstructive pulmonary disease with (acute) exacerbation: Secondary | ICD-10-CM | POA: Diagnosis not present

## 2022-02-12 DIAGNOSIS — J209 Acute bronchitis, unspecified: Secondary | ICD-10-CM

## 2022-02-12 DIAGNOSIS — J440 Chronic obstructive pulmonary disease with acute lower respiratory infection: Secondary | ICD-10-CM | POA: Diagnosis not present

## 2022-02-12 MED ORDER — PREDNISONE 10 MG PO TABS: ORAL_TABLET | ORAL | 0 refills | Status: DC

## 2022-02-12 MED ORDER — DOXYCYCLINE HYCLATE 100 MG PO TABS: 100.0000 mg | ORAL_TABLET | Freq: Two times a day (BID) | ORAL | 0 refills | Status: DC

## 2022-02-12 NOTE — Patient Instructions (Addendum)
It is good to see you today. We will start antibiotics today.  Doxycycline 100 mg twice daily  x 1 week. Mucinex 1200 mg with a full glass of water  We will call you with results Continue Stialto every day without fail. This is your maintenance inhaler.  Albuterol is your rescue. This is for breakthrough shortness of breath or wheezing , up to 4 times a day, Prednisone taper; 10 mg tablets: 4 tabs x 2 days, 3 tabs x 2 days, 2 tabs x 2 days 1 tab x 2 days then stop.  Watch your blood sugars while on prednisone Flutter valve 3-4 times an hour x 2 weeks You can stop once chest congestion clears.  Sips of water instead of throat clearing Sugar Free [employer] or [employer] originals for throat soothing. Delsym Cough syrup 5 cc's every 12 hours Follow up in 2 weeks with Tamara Roch NP to ensure you are getting better.  Call if you need to be seen sooner.  Please contact office for sooner follow up if symptoms do not improve or worsen or seek emergency care

## 2022-02-12 NOTE — Progress Notes (Signed)
History of Present Illness Tamara [name] is a 77 y.o. female former tobacco use  with suspected COPD and history of chronic cough. She has a medial left lower lobe opacity, negative on PET scan for hypermetabolism, negative on bronchoscopy 05/2020, question rounded atelectasis.  This is being followed with imaging by Dr. Lamonte Sakai.    02/12/2022 Pt. Presents for an acute visit. She was last seen in the office 08/18/2021 by Dr. Lamonte Sakai.She is here today for an acute visit. She has nasal and chest congestion. She endorses sinus tenderness and pain over her forehead, and below her eyes. This started the beginning of December. The patient thinks she had the flu. Secretions from her nose are bloody . She endorses wheezing, cough and fatigue. She feels warm to my touch and she states she has been having chills and then sweats. She has not tested for Covid, Flu or RSV. As this has been ongoing for almost 2 months I will not test. She was seen by her PCP the first of December and she was treated with prednisone which patient says does not help. She is using her albuterol nebs and Mucinex.She states she is compliant with her Stialto. She went without her Stialto from October 2023 - January 2024 as she was in the donut hole and cost was  going to be $3,600 which patient could not afford. I suspect 3 months without her maintenance has triggered a flare that has not resolved.  Pt. States she was not using her maintenance with her albuterol as she did not think they coulf be used together. We did education regarding the use of maintenance and rescue medications together, and when to take each one.    Test Results: CT chest 02/06/2021 shows a persistent cavitary airspace disease in the left lower lobe with associated volume loss, overall similar appearance to 06/25/2020 and 05/09/2020 although there is some progressive peripheral airspace disease that looks consistent with postobstructive pneumonitis      Latest Ref Rng &  Units 08/26/2021   10:10 AM 07/28/2020   10:26 AM 05/13/2020    2:38 PM  CBC  WBC 3.8 - 10.8 Thousand/uL 5.7  6.4  7.6   Hemoglobin 11.7 - 15.5 g/dL 10.9  10.7  11.1   Hematocrit 35.0 - 45.0 % 33.2  34.5  33.1   Platelets 140 - 400 Thousand/uL 164  154  159.0        Latest Ref Rng & Units 01/25/2022    2:44 PM 11/27/2021    1:41 PM 10/21/2021    2:31 PM  BMP  Glucose 70 - 99 mg/dL  174  161   BUN 8 - 27 mg/dL  27  17   Creatinine 0.44 - 1.00 mg/dL 1.00  1.32  1.03   BUN/Creat Ratio 12 - '28  20  17   '$ Sodium 134 - 144 mmol/L  138  140   Potassium 3.5 - 5.2 mmol/L  4.6  4.1   Chloride 96 - 106 mmol/L  98  102   CO2 20 - 29 mmol/L  25  27   Calcium 8.7 - 10.3 mg/dL  9.9  9.9     BNP    Component Value Date/Time   BNP 16 10/21/2021 1431    ProBNP No results found for: "PROBNP"  PFT    Component Value Date/Time   FEV1PRE 1.91 08/18/2021 0955   FEV1POST 1.94 08/18/2021 0955   FVCPRE 2.27 08/18/2021 0955   FVCPOST 2.28 08/18/2021 0955  TLC 4.19 08/18/2021 0955   DLCOUNC 14.64 08/18/2021 0955   PREFEV1FVCRT 84 08/18/2021 0955   PSTFEV1FVCRT 85 08/18/2021 0955    CT CORONARY MORPH W/CTA COR W/SCORE W/CA W/CM &/OR WO/CM  Addendum Date: 01/26/2022   ADDENDUM REPORT: 01/26/2022 10:01 EXAM: OVER-READ INTERPRETATION  CT CHEST The following report is an over-read performed by radiologist Dr. Rebekah Chesterfield [facility] on 01/26/2022. This over-read does not include interpretation of cardiac or coronary anatomy or pathology. The coronary calcium score and cardiac CTA interpretation by the cardiologist is attached. COMPARISON:  None. FINDINGS: Extensive airspace consolidation with rounded margins in the left lower lobe, but with internal CT angiogram sign, most suggestive of round pneumonia. In the superior aspect of this region there are some cystic areas, similar to prior chest CT and PET-CT. Patchy ground-glass attenuation noted elsewhere in the lungs, particularly in the  left upper lobe. No pleural effusions. No definite lymphadenopathy noted in the visualized portions of the mediastinum or hilar regions. Atherosclerotic calcifications in the thoracic aorta. Visualized portions of the upper abdomen demonstrate a shrunken and nodular contour of the visualized liver indicative of underlying cirrhosis. There are no aggressive appearing lytic or blastic lesions noted in the visualized portions of the skeleton. IMPRESSION: 1. Persistent findings in the left lower lobe which have imaging characteristics most suggestive of a round pneumonia. However, the persistence of this on several prior examinations dating back to prior chest CT 04/16/2020 is highly unusual and raises concern for obstructing bronchial abnormality or other centrally obstructing lesion. Given the lack of hypermetabolism on prior PET-CT, this may represent a benign lesion, however, strong consideration for further evaluation with bronchoscopy and tissue sampling is advised if clinically appropriate to exclude slow growing malignancy. 2. Cirrhosis. Electronically Signed   By: Vinnie Langton M.D.   On: 01/26/2022 10:01   Result Date: 01/26/2022 CLINICAL DATA:  Chest pain EXAM: Cardiac CTA MEDICATIONS: Sub lingual nitro. '4mg'$  x 2 TECHNIQUE: The patient was scanned on a Siemens 297 slice scanner. Gantry rotation speed was 250 msecs. Collimation was 0.6 mm. A 100 kV prospective scan was triggered in the ascending thoracic aorta at 35-75% of the R-R interval. Average HR during the scan was 60 bpm. The 3D data set was interpreted on a dedicated work station using MPR, MIP and VRT modes. A total of 80cc of contrast was used. FINDINGS: Non-cardiac: See separate report from [facility]. Pulmonary veins drain normally to the left atrium. No LA appendage thrombus. Prominent mitral annular calcification. Calcium Score: 1198 Agatston units. Coronary Arteries: Right dominant with no anomalies LM: Calcified plaque proximal  vessel, mild (1-24%) stenosis. LAD system: Calcified plaque proximal and mid LAD, mild (25-49%) stenosis. Circumflex system: Mixed plaque proximal and mid LCx diffusely, suspect mild (25-49%) stenosis. RCA system: Diffuse disease in the proximal and mid RCA and in the PDA, suspect mild (25-49%) stenosis. IMPRESSION: 1. Coronary artery calcium score 1198 Agatston units. This places the patient in the 95th percentile for age and gender. 2. Extensive plaque but nonobstructive disease by FFR in the LAD and LCx systems. 3. FFR 0.79 in the distal PDA. This is of borderline significance. There is no discrete severe stenosis in the RCA system. There is diffuse plaque throughout with resultant borderline FFR decrease in the distal PDA. [employer] technician Electronically Signed: By: Loralie Champagne M.D. On: 01/25/2022 19:32     Past medical hx Past Medical History:  Diagnosis Date   Arthritis    knee and shoulders  Colon polyps 09/05/2017   Diabetes mellitus without complication ([place])    type II   Dyspnea    05/16/20 has had a cough for 2.5 years post Covid   Gastritis 09/05/2017   GERD (gastroesophageal reflux disease)    Hyperlipidemia    Hypertension    patient denies   Hypothyroidism    Panic attack    RUQ pain 03/09/2017   Per patient, she has had RUQ pain 4-5 years that has grown worse in the last few months.     Social History   Tobacco Use   Smoking status: Former    Packs/day: 1.00    Years: 40.00    Total pack years: 40.00    Types: Cigarettes    Quit date: 2006    Years since quitting: 18.0   Smokeless tobacco: Never   Tobacco comments:    Smoking cessation materials not required  Vaping Use   Vaping Use: Never used  Substance Use Topics   Alcohol use: No   Drug use: No    Ms.Nobles reports that she quit smoking about 18 years ago. Her smoking use included cigarettes. She has a 40.00 pack-year smoking history. She has never used smokeless tobacco. She reports that she does not  drink alcohol and does not use drugs.  Tobacco Cessation: Former smoker , quit 2006 with a 40 pack year smoking history  Past surgical hx, Family hx, Social hx all reviewed.  Current Outpatient Medications on File Prior to Visit  Medication Sig   Accu-Chek Softclix Lancets lancets TEST BLOOD SUGAR TWICE DAILY   albuterol (PROVENTIL) (2.5 MG/3ML) 0.083% nebulizer solution Take 3 mLs (2.5 mg total) by nebulization every 6 (six) hours as needed for wheezing or shortness of breath.   albuterol (VENTOLIN HFA) 108 (90 Base) MCG/ACT inhaler Inhale 2 puffs into the lungs every 6 (six) hours as needed for wheezing or shortness of breath.   Alcohol Swabs (B-D SINGLE USE SWABS REGULAR) PADS USE  TO TEST BLOOD SUGAR TWICE DAILY   Ascorbic Acid (VITAMIN C) 1000 MG tablet Take 1,000 mg by mouth daily.   aspirin EC 81 MG tablet Take 1 tablet (81 mg total) by mouth daily. Swallow whole.   Cholecalciferol (VITAMIN D) 50 MCG (2000 UT) CAPS Take 1,000 Units by mouth daily.   dapagliflozin propanediol (FARXIGA) 5 MG TABS tablet Take 1 tablet (5 mg total) by mouth daily before breakfast.   DROPLET PEN NEEDLES 32G X 4 MM MISC 1 Device by Other route daily.   ezetimibe (ZETIA) 10 MG tablet Take 1 tablet (10 mg total) by mouth daily.   furosemide (LASIX) 40 MG tablet Take 1 tablet (40 mg total) by mouth daily.   gabapentin (NEURONTIN) 300 MG capsule Take by mouth.   hydrochlorothiazide (HYDRODIURIL) 25 MG tablet Take 1 tablet (25 mg total) by mouth daily.   Insulin NPH, Human,, Isophane, (NOVOLIN N FLEXPEN) 100 UNIT/ML Kiwkpen Inject under skin 36 units each morning, and 28-30 units each evening   ipratropium-albuterol (DUONEB) 0.5-2.5 (3) MG/3ML SOLN Take 3 mLs by nebulization every 6 (six) hours as needed.   levothyroxine (SYNTHROID) 50 MCG tablet Take 1 tablet (50 mcg total) by mouth daily before breakfast.   meloxicam (MOBIC) 15 MG tablet Take 0.5-1 tablets (7.5-15 mg total) by mouth daily as needed for pain.    metFORMIN (GLUCOPHAGE) 1000 MG tablet Take 1 tablet (1,000 mg total) by mouth 2 (two) times daily.   methocarbamol (ROBAXIN) 500 MG tablet TAKE 1 TABLET  EVERY 8 HOURS AS NEEDED FOR MUSCLE SPASMS.   nystatin (MYCOSTATIN/NYSTOP) powder Apply 1 application. topically daily as needed (yeast under belly).   pantoprazole (PROTONIX) 40 MG tablet TAKE 1 TABLET TWICE DAILY   potassium chloride SA (KLOR-CON M) 20 MEQ tablet Take 1 tablet (20 mEq total) by mouth daily. You may take an extra tablet (20 mEq total) by mouth as needed when you administer your furosemide (lasix).   rosuvastatin (CRESTOR) 20 MG tablet Take 1 tablet (20 mg total) by mouth at bedtime.   Tiotropium Bromide-Olodaterol (STIOLTO RESPIMAT) 2.5-2.5 MCG/ACT AERS Inhale 2 puffs into the lungs daily.   TRUE METRIX BLOOD GLUCOSE TEST test strip Use 2x a day   valsartan (DIOVAN) 80 MG tablet Take 1 tablet (80 mg total) by mouth daily.   No current facility-administered medications on file prior to visit.     Allergies  Allergen Reactions   Penicillins Hives    Reaction: 1964    Review Of Systems:  Constitutional:   No  weight loss, + night sweats,  Fevers, chills,+ fatigue, or  lassitude.  HEENT:   No headaches,  Difficulty swallowing,  Tooth/dental problems, or  Sore throat,                No sneezing, itching, ear ache, nasal congestion, post nasal drip,   CV:  No chest pain,  Orthopnea, PND, swelling in lower extremities, anasarca, dizziness, palpitations, syncope.   GI  No heartburn, indigestion, abdominal pain, nausea, vomiting, diarrhea, change in bowel habits, loss of appetite, bloody stools.   Resp: + shortness of breath with exertion or at rest.  + excess mucus, + productive cough,  No non-productive cough,  No coughing up of blood.  + change in color of mucus.  + wheezing.  No chest wall deformity  Skin: no rash or lesions.  GU: no dysuria, change in color of urine, no urgency or frequency.  No flank pain, no  hematuria   MS:  No joint pain or swelling.  No decreased range of motion.  No back pain.  Psych:  No change in mood or affect. No depression or anxiety.  No memory loss.   Vital Signs BP 132/66   Pulse 97   Temp 98.4 F (36.9 C) (Oral)   Ht '5\' 6"'$  (1.676 m)   Wt 235 lb 6.4 oz (106.8 kg)   SpO2 98%   BMI 37.99 kg/m    Physical Exam:  General- No distress,  A&Ox3, pleasant ENT: ++ sinus tenderness, TM clear, edematous  nasal mucosa, no oral exudate,+ post nasal drip, no LAN Cardiac: S1, S2, regular rate and rhythm, no murmur Chest: + wheeze/ rales/ No dullness; no accessory muscle use, no nasal flaring, no sternal retractions, diminished per bases Abd.: Soft Non-tender, ND, BS +, Body mass index is 37.99 kg/m.  Ext: No clubbing cyanosis, edema Neuro:  normal strength, MAE x 4, A&O x 3 Skin: No rashes, warm and dry, no lesions  Psych: normal mood and behavior   Assessment/Plan Slow to resolve COPD Flare Sinusitis Plan We will start antibiotics today.  Doxycycline 100 mg twice daily  x 1 week. Mucinex 1200 mg with a full glass of water  We will call you with results Continue Stialto every day without fail. This is your maintenance inhaler.  Albuterol is your rescue. This is for breakthrough shortness of breath or wheezing , up to 4 times a day, Prednisone taper; 10 mg tablets: 4 tabs x 2 days, 3  tabs x 2 days, 2 tabs x 2 days 1 tab x 2 days then stop.  Watch your blood sugars while on prednisone Flutter valve 3-4 times an hour x 2 weeks You can stop once chest congestion clears.  Sips of water instead of throat clearing Sugar Free [employer] or [employer] originals for throat soothing. Delsym Cough syrup 5 cc's every 12 hours Follow up in 2 weeks with Judson Roch NP to ensure you are getting better.  Call if you need to be seen sooner.  Please contact office for sooner follow up if symptoms do not improve or worsen or seek emergency care    I spent 40 minutes  dedicated to the care of this patient on the date of this encounter to include pre-visit review of records, face-to-face time with the patient discussing conditions above, post visit ordering of testing, clinical documentation with the electronic health record, making appropriate referrals as documented, and communicating necessary information to the patient's healthcare team.      Magdalen Spatz, NP 02/12/2022  11:57 AM

## 2022-02-15 NOTE — Progress Notes (Unsigned)
Cardiology Office Note:    Date:  02/16/2022   ID:  Tamara [name], DOB 29-Oct-1945, MRN [id number]  PCP:  Bo Merino, [place] [facility] Cardiologist:  Freada Bergeron, MD     Referring MD: No ref. provider found   Chief Complaint  Patient presents with   Follow-up    Seen for Dr. Johney Frame    History of Present Illness:    Tamara [name] is a 77 y.o. female with a hx of HTN, aortic atherosclerosis, HLD, CAD, COPD, DM2.  Initially established care with Dr. Johney Frame on 11/16/21 after she had a recent CT of her chest which showed triple vessel coronary calcification. At this time, she was having some SOB and intermittent chest pain. Decision was made for further testing, including echo and coronary CTA.   Coronary CTA - calcium score 1198, 95th percentile age and gender, extensive non-obstructive plaque by FFR in the LAD and Lcx, FFR 0.79 in the distal PDA.   She presents today for follow-up of her recent testing.  She was just seen by pulmonology 4 days ago for acute bronchitis.  She states overall she feels "the same as I always do". She endorses fatigue, SOB, chronic cough that has been present for a few years. We discussed her recent CTA, and the possibility of further ischemic testing, however, she denies the need for further testing at this time but agrees to discuss at later visits. She is concerned with going in the donut hole and does not want to do anything that is not absolutely necessary. BP initially elevated 146/88, rechecked 136/78, she does have a BP cuff but she does not check routinely and questions the validity. Advised her to bring machine with her to her next appt and someone could assist her with checking to see if it is working correctly. She recently began to take her Crestor again (01/28/22), states she thought the Zetia was to replace it. Discussed indications for good lipid management and possibility of referral to our lipid clinic  if her next lipid panel is not in the desired range.  She denies chest pain, palpitations, pnd, orthopnea, n, v, dizziness, syncope, weight gain, or early satiety. She has occasional pedal edema that typically resolves when she elevates her feet or goes away overnight.    Past Medical History:  Diagnosis Date   Arthritis    knee and shoulders   Colon polyps 09/05/2017   Diabetes mellitus without complication ([place])    type II   Dyspnea    05/16/20 has had a cough for 2.5 years post Covid   Gastritis 09/05/2017   GERD (gastroesophageal reflux disease)    Hyperlipidemia    Hypertension    patient denies   Hypothyroidism    Panic attack    RUQ pain 03/09/2017   Per patient, she has had RUQ pain 4-5 years that has grown worse in the last few months.    Past Surgical History:  Procedure Laterality Date   BREAST BIOPSY Left 2008   CORE W/CLIP - NEG   BRONCHIAL BIOPSY  05/19/2020   Procedure: BRONCHIAL BIOPSIES;  Surgeon: Collene Gobble, MD;  Location: [facility] ENDOSCOPY;  Service: Pulmonary;;   BRONCHIAL BRUSHINGS  05/19/2020   Procedure: BRONCHIAL BRUSHINGS;  Surgeon: Collene Gobble, MD;  Location: [facility] ENDOSCOPY;  Service: Pulmonary;;   BRONCHIAL NEEDLE ASPIRATION BIOPSY  05/19/2020   Procedure: BRONCHIAL NEEDLE ASPIRATION BIOPSIES;  Surgeon: Collene Gobble, MD;  Location: [facility]  ENDOSCOPY;  Service: Pulmonary;;   BRONCHIAL WASHINGS  05/19/2020   Procedure: BRONCHIAL WASHINGS;  Surgeon: Collene Gobble, MD;  Location: [facility] ENDOSCOPY;  Service: Pulmonary;;   COLONOSCOPY WITH PROPOFOL N/A 02/08/2017   Procedure: COLONOSCOPY WITH PROPOFOL;  Surgeon: Lollie Sails, MD;  Location[facility] ENDOSCOPY;  Service: Endoscopy;  Laterality: N/A;   ESOPHAGOGASTRODUODENOSCOPY (EGD) WITH PROPOFOL N/A 02/08/2017   Procedure: ESOPHAGOGASTRODUODENOSCOPY (EGD) WITH PROPOFOL;  Surgeon: Lollie Sails, MD;  Location: [facility] ENDOSCOPY;  Service: Endoscopy;  Laterality: N/A;   TUBAL LIGATION     VIDEO BRONCHOSCOPY WITH  ENDOBRONCHIAL NAVIGATION N/A 05/19/2020   Procedure: VIDEO BRONCHOSCOPY WITH ENDOBRONCHIAL NAVIGATION;  Surgeon: Collene Gobble, MD;  Location: [facility] ENDOSCOPY;  Service: Pulmonary;  Laterality: N/A;    Current Medications: No outpatient medications have been marked as taking for the 02/16/22 encounter (Office Visit) with Trudi Ida, NP.     Allergies:   Penicillins   Social History   Socioeconomic History   Marital status: Married    Spouse name: Desarea Ohagan   Number of children: 3   Years of education: Not on file   Highest education level: High school graduate  Occupational History   Occupation: retired  Tobacco Use   Smoking status: Former    Packs/day: 1.00    Years: 40.00    Total pack years: 40.00    Types: Cigarettes    Quit date: 2006    Years since quitting: 18.0   Smokeless tobacco: Never   Tobacco comments:    Smoking cessation materials not required  Vaping Use   Vaping Use: Never used  Substance and Sexual Activity   Alcohol use: No   Drug use: No   Sexual activity: Not on file  Other Topics Concern   Not on file  Social History Narrative   Not on file   Social Determinants of Health   Financial Resource Strain: Medium Risk (10/20/2021)   Overall Financial Resource Strain (CARDIA)    Difficulty of Paying Living Expenses: Somewhat hard  Food Insecurity: No Food Insecurity (10/20/2021)   Hunger Vital Sign    Worried About Running Out of Food in the Last Year: Never true    Ran Out of Food in the Last Year: Never true  Transportation Needs: No Transportation Needs (10/20/2021)   PRAPARE - Hydrologist (Medical): No    Lack of Transportation (Non-Medical): No  Physical Activity: Inactive (10/20/2021)   Exercise Vital Sign    Days of Exercise per Week: 0 days    Minutes of Exercise per Session: 0 min  Stress: Stress Concern Present (10/20/2021)   [place]    Feeling of Stress : To some extent  Social Connections: Moderately Integrated (10/20/2021)   Social Connection and Isolation Panel [NHANES]    Frequency of Communication with Friends and Family: More than three times a week    Frequency of Social Gatherings with Friends and Family: More than three times a week    Attends Religious Services: 1 to 4 times per year    Active Member of [employer] or Organizations: No    Attends Archivist Meetings: Never    Marital Status: Married     Family History: The patient's family history includes Diabetes in her maternal grandmother. There is no history of Breast cancer.  ROS:   Please see the history of present illness.     All other systems  reviewed and are negative.  EKGs/Labs/Other Studies Reviewed:    The following studies were reviewed today:  01/27/2020 coronary CTA with FFR -  1. Coronary artery calcium score 1198 Agatston units. This places the patient in the 95th percentile for age and gender.   2. Extensive plaque but nonobstructive disease by FFR in the LAD and LCx systems.   3. FFR 0.79 in the distal PDA. This is of borderline significance. There is no discrete severe stenosis in the RCA system. There is diffuse plaque throughout with resultant borderline FFR decrease in the distal PDA.     11/27/21 echo complete -EF 65 to 70%, no RWMA, mild concentric LVH, grade 1 diastolic dysfunction, no valvular abnormalities.    EKG:  EKG is not ordered today.    Recent Labs: 08/26/2021: Hemoglobin 10.9; Platelets 164; TSH 3.34 10/21/2021: ALT 16; Brain Natriuretic Peptide 16 11/27/2021: BUN 27; Potassium 4.6; Sodium 138 01/25/2022: Creatinine, Ser 1.00  Recent Lipid Panel    Component Value Date/Time   CHOL 185 08/26/2021 1010   TRIG 182 (H) 08/26/2021 1010   HDL 43 (L) 08/26/2021 1010   CHOLHDL 4.3 08/26/2021 1010   VLDL 29 08/23/2016 0825   LDLCALC 112 (H) 08/26/2021 1010     Risk  Assessment/Calculations:                Physical Exam:    VS:  BP 138/78   Pulse 93   Ht '5\' 6"'$  (1.676 m)   Wt 233 lb (105.7 kg)   SpO2 96%   BMI 37.61 kg/m     Wt Readings from Last 3 Encounters:  02/16/22 233 lb (105.7 kg)  02/12/22 235 lb 6.4 oz (106.8 kg)  12/21/21 236 lb 9.6 oz (107.3 kg)     GEN:  Well nourished, well developed in no acute distress HEENT: Normal NECK: No JVD; No carotid bruits LYMPHATICS: No lymphadenopathy CARDIAC: RRR, no murmurs, rubs, gallops RESPIRATORY:  expiratory wheezes noted ABDOMEN: Soft, non-tender, non-distended MUSCULOSKELETAL: trace edema; No deformity  SKIN: Warm and dry NEUROLOGIC:  Alert and oriented x 3 PSYCHIATRIC:  Normal affect   ASSESSMENT:    1. Coronary artery disease involving native coronary artery of native heart without angina pectoris   2. Primary hypertension   3. SOB (shortness of breath)   4. Mixed hyperlipidemia   5. Bilateral lower extremity edema   6. Type 2 diabetes mellitus with complication, without long-term current use of insulin (HCC)   7. Hypothyroidism, unspecified type    PLAN:    In order of problems listed above:  CAD - denies chest pain or acute decompensation. Discussed finding of recent testing and additional ischemic testing. She does not want to proceed with further workup at this time as she is not feeling any worse than usual, and is concerned with going in the donut hole with subsequent inability to afford her inhalers. Continue ASA and statin therapy.  HTN - BP 146/88, repeat 136/78. Has a BP cuff but is not sure how to work it or if it is reliable. Advised her to bring to her next appt with any provider and someone could help her with proper techniques. Continue with current antihypertensive regimen.  SOB - expiratory wheezing noted, SPO2 93% in office today, saw pulmonology on 02/12/22 and is being treated for acute bronchitis. Denies fever, chills, worsening fatigue. Discussed further  ischemic workup to help differentiate causes of SOB, she declines at this time.   HLD - LDL on 08/26/21 was  112. Some confusion when she started Zetia, she though it was to replace her Crestor, so she has been off of her Crestor until 01/28/22. She is now currently taking both as prescribed. Discussed referral to lipid clinic, however will hold off on that until we see what her recent lipid panel shows. FLP and LFTs in 6-8 weeks.   Lower extremity edema - trace edema present today, resolves with elevation, discussed sodium reduction  DM2 - BS this am 304, currently on prednisone for bronchitis, managed by her PCP  Hypothyroidism - managed by her PCP     Disposition - follow up with Dr.Pemberton in 3-4 months.      Medication Adjustments/Labs and Tests Ordered: Current medicines are reviewed at length with the patient today.  Concerns regarding medicines are outlined above.  Orders Placed This Encounter  Procedures   Lipid Profile   Hepatic function panel   Meds ordered this encounter  Medications   ezetimibe (ZETIA) 10 MG tablet    Sig: Take 1 tablet (10 mg total) by mouth daily.    Dispense:  90 tablet    Refill:  3    Patient Instructions  Medication Instructions:  Your physician recommends that you continue on your current medications as directed. Please refer to the Current Medication list given to you today.  *If you need a refill on your cardiac medications before your next appointment, please call your pharmacy*   Lab Work: Fasting lipids and lft's in 6-8 weeks If you have labs (blood work) drawn today and your tests are completely normal, you will receive your results only by: [place] (if you have MyChart) OR A paper copy in the mail If you have any lab test that is abnormal or we need to change your treatment, we will call you to review the results.   Follow-Up: At [facility], you and your health needs are our priority.  As part of our continuing  mission to provide you with exceptional heart care, we have created designated Provider Care Teams.  These Care Teams include your primary Cardiologist (physician) and Advanced Practice Providers (APPs -  Physician Assistants and Nurse Practitioners) who all work together to provide you with the care you need, when you need it.  We recommend signing up for the patient portal called "MyChart".  Sign up information is provided on this After Visit Summary.  MyChart is used to connect with patients for Virtual Visits (Telemedicine).  Patients are able to view lab/test results, encounter notes, upcoming appointments, etc.  Non-urgent messages can be sent to your provider as well.   To learn more about what you can do with MyChart, go to [email or web address].    Your next appointment:   3-4 month(s)  Provider:   Freada Bergeron, MD    Signed, Trudi Ida, NP  02/16/2022 1:05 PM    [place]

## 2022-02-16 ENCOUNTER — Ambulatory Visit: Payer: Medicare HMO | Attending: Physician Assistant | Admitting: Cardiology

## 2022-02-16 ENCOUNTER — Encounter: Payer: Self-pay | Admitting: Cardiology

## 2022-02-16 VITALS — BP 138/78 | HR 93 | Ht 66.0 in | Wt 233.0 lb

## 2022-02-16 DIAGNOSIS — R600 Localized edema: Secondary | ICD-10-CM

## 2022-02-16 DIAGNOSIS — I2510 Atherosclerotic heart disease of native coronary artery without angina pectoris: Secondary | ICD-10-CM | POA: Diagnosis not present

## 2022-02-16 DIAGNOSIS — E039 Hypothyroidism, unspecified: Secondary | ICD-10-CM | POA: Diagnosis not present

## 2022-02-16 DIAGNOSIS — E118 Type 2 diabetes mellitus with unspecified complications: Secondary | ICD-10-CM

## 2022-02-16 DIAGNOSIS — I10 Essential (primary) hypertension: Secondary | ICD-10-CM | POA: Diagnosis not present

## 2022-02-16 DIAGNOSIS — R0602 Shortness of breath: Secondary | ICD-10-CM

## 2022-02-16 DIAGNOSIS — E782 Mixed hyperlipidemia: Secondary | ICD-10-CM

## 2022-02-16 MED ORDER — EZETIMIBE 10 MG PO TABS: 10.0000 mg | ORAL_TABLET | Freq: Every day | ORAL | 3 refills | Status: DC

## 2022-02-16 NOTE — Patient Instructions (Signed)
Medication Instructions:  Your physician recommends that you continue on your current medications as directed. Please refer to the Current Medication list given to you today.  *If you need a refill on your cardiac medications before your next appointment, please call your pharmacy*   Lab Work: Fasting lipids and lft's in 6-8 weeks If you have labs (blood work) drawn today and your tests are completely normal, you will receive your results only by: [place] (if you have MyChart) OR A paper copy in the mail If you have any lab test that is abnormal or we need to change your treatment, we will call you to review the results.   Follow-Up: At [facility], you and your health needs are our priority.  As part of our continuing mission to provide you with exceptional heart care, we have created designated Provider Care Teams.  These Care Teams include your primary Cardiologist (physician) and Advanced Practice Providers (APPs -  Physician Assistants and Nurse Practitioners) who all work together to provide you with the care you need, when you need it.  We recommend signing up for the patient portal called "MyChart".  Sign up information is provided on this After Visit Summary.  MyChart is used to connect with patients for Virtual Visits (Telemedicine).  Patients are able to view lab/test results, encounter notes, upcoming appointments, etc.  Non-urgent messages can be sent to your provider as well.   To learn more about what you can do with MyChart, go to [email or web address].    Your next appointment:   3-4 month(s)  Provider:   Freada Bergeron, MD

## 2022-02-26 ENCOUNTER — Ambulatory Visit: Payer: Medicare HMO | Admitting: Family Medicine
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
## 2022-03-01 ENCOUNTER — Encounter: Payer: Self-pay | Admitting: Internal Medicine

## 2022-03-01 ENCOUNTER — Ambulatory Visit
Admission: RE | Admit: 2022-03-01 | Discharge: 2022-03-01 | Disposition: A | Discharge status: Home / Self Care | Payer: Medicare HMO | Attending: Internal Medicine | Admitting: Internal Medicine

## 2022-03-01 ENCOUNTER — Ambulatory Visit
Admission: RE | Admit: 2022-03-01 | Discharge: 2022-03-01 | Disposition: A | Discharge status: Home / Self Care | Payer: Medicare HMO | Source: Ambulatory Visit | Attending: Internal Medicine | Admitting: Internal Medicine

## 2022-03-01 ENCOUNTER — Ambulatory Visit (INDEPENDENT_AMBULATORY_CARE_PROVIDER_SITE_OTHER): Payer: Medicare HMO | Admitting: Internal Medicine

## 2022-03-01 DIAGNOSIS — B372 Candidiasis of skin and nail: Secondary | ICD-10-CM | POA: Diagnosis not present

## 2022-03-01 DIAGNOSIS — R519 Headache, unspecified: Secondary | ICD-10-CM | POA: Diagnosis not present

## 2022-03-01 DIAGNOSIS — R053 Chronic cough: Secondary | ICD-10-CM

## 2022-03-01 DIAGNOSIS — Z1211 Encounter for screening for malignant neoplasm of colon: Secondary | ICD-10-CM

## 2022-03-01 DIAGNOSIS — Z1231 Encounter for screening mammogram for malignant neoplasm of breast: Secondary | ICD-10-CM

## 2022-03-01 DIAGNOSIS — J441 Chronic obstructive pulmonary disease with (acute) exacerbation: Secondary | ICD-10-CM | POA: Diagnosis not present

## 2022-03-01 DIAGNOSIS — B370 Candidal stomatitis: Secondary | ICD-10-CM

## 2022-03-01 DIAGNOSIS — E079 Disorder of thyroid, unspecified: Secondary | ICD-10-CM | POA: Diagnosis not present

## 2022-03-01 DIAGNOSIS — R059 Cough, unspecified: Secondary | ICD-10-CM | POA: Diagnosis not present

## 2022-03-01 DIAGNOSIS — J449 Chronic obstructive pulmonary disease, unspecified: Secondary | ICD-10-CM | POA: Diagnosis not present

## 2022-03-01 DIAGNOSIS — E785 Hyperlipidemia, unspecified: Secondary | ICD-10-CM | POA: Diagnosis not present

## 2022-03-01 MED ORDER — ROSUVASTATIN CALCIUM 20 MG PO TABS: 20.0000 mg | ORAL_TABLET | Freq: Every day | ORAL | 1 refills | Status: DC

## 2022-03-01 MED ORDER — NYSTATIN 100000 UNIT/ML MT SUSP: 5.0000 mL | Freq: Four times a day (QID) | OROMUCOSAL | 0 refills | Status: DC

## 2022-03-01 MED ORDER — LEVOTHYROXINE SODIUM 50 MCG PO TABS: 50.0000 ug | ORAL_TABLET | Freq: Every day | ORAL | 1 refills | Status: DC

## 2022-03-01 MED ORDER — NYSTATIN 100000 UNIT/GM EX POWD: 1.0000 | Freq: Every day | CUTANEOUS | 2 refills | Status: AC | PRN

## 2022-03-01 MED ORDER — ALBUTEROL SULFATE (2.5 MG/3ML) 0.083% IN NEBU: 2.5000 mg | INHALATION_SOLUTION | Freq: Four times a day (QID) | RESPIRATORY_TRACT | 5 refills | Status: DC | PRN

## 2022-03-01 NOTE — Patient Instructions (Addendum)
It was great seeing you today!  Plan discussed at today's visit: -CT of head ordered  -Chest x-ray ordered today as well -Medications refilled -Nystatin liquid medication ordered for oral yeast infection - be sure to rinse with water after using inhaler to prevent reoccurrence   Follow up in: 3 months   Take care and let us know if you have any questions or concerns prior to your next visit.  Dr. Rosana Berger

## 2022-03-02 ENCOUNTER — Ambulatory Visit: Payer: Medicare HMO | Admitting: Internal Medicine

## 2022-03-02 ENCOUNTER — Encounter: Payer: Self-pay | Admitting: Internal Medicine

## 2022-03-02 VITALS — BP 122/68 | HR 104 | Ht 66.0 in | Wt 237.4 lb

## 2022-03-02 DIAGNOSIS — J440 Chronic obstructive pulmonary disease with acute lower respiratory infection: Secondary | ICD-10-CM | POA: Diagnosis not present

## 2022-03-02 DIAGNOSIS — J449 Chronic obstructive pulmonary disease, unspecified: Secondary | ICD-10-CM | POA: Insufficient documentation

## 2022-03-02 DIAGNOSIS — J984 Other disorders of lung: Secondary | ICD-10-CM

## 2022-03-02 DIAGNOSIS — J209 Acute bronchitis, unspecified: Secondary | ICD-10-CM

## 2022-03-02 LAB — CBC WITH DIFFERENTIAL/PLATELET
Basophils Absolute: 0.1 10*3/uL (ref 0.0–0.1)
Basophils Relative: 0.7 % (ref 0.0–3.0)
Eosinophils Absolute: 0.4 10*3/uL (ref 0.0–0.7)
Eosinophils Relative: 6.3 % — ABNORMAL HIGH (ref 0.0–5.0)
HCT: 31.9 % — ABNORMAL LOW (ref 36.0–46.0)
Hemoglobin: 10.7 g/dL — ABNORMAL LOW (ref 12.0–15.0)
Lymphocytes Relative: 20.7 % (ref 12.0–46.0)
Lymphs Abs: 1.5 10*3/uL (ref 0.7–4.0)
MCHC: 33.5 g/dL (ref 30.0–36.0)
MCV: 79.5 fl (ref 78.0–100.0)
Monocytes Absolute: 0.5 10*3/uL (ref 0.1–1.0)
Monocytes Relative: 6.5 % (ref 3.0–12.0)
Neutro Abs: 4.6 10*3/uL (ref 1.4–7.7)
Neutrophils Relative %: 65.8 % (ref 43.0–77.0)
Platelets: 135 10*3/uL — ABNORMAL LOW (ref 150.0–400.0)
RBC: 4.01 Mil/uL (ref 3.87–5.11)
RDW: 15.5 % (ref 11.5–15.5)
WBC: 7.1 10*3/uL (ref 4.0–10.5)

## 2022-03-02 MED ORDER — LEVOFLOXACIN 750 MG PO TABS: 750.0000 mg | ORAL_TABLET | Freq: Every day | ORAL | 0 refills | Status: DC

## 2022-03-02 NOTE — Patient Instructions (Signed)
Order- CBC w diff      dx acute bronchitis  Script sent for levofloxacin- 1 daily x 7 days

## 2022-03-02 NOTE — Assessment & Plan Note (Signed)
Chronic process in the left lung is not well-defined on recent chest x-ray.  There is hazy and some blunting of left costophrenic angle without meniscus and without obvious silhouette sign otherwise. Plan-follow-up with Dr. Lamonte Sakai for long-term tracking.

## 2022-03-02 NOTE — Assessment & Plan Note (Signed)
She denies much benefit from recent doxycycline and prednisone.  In the past week she now describes a more pleuritic pain at the right lower anterolateral rib cage with no associated rub or difference in breath sounds.  She describes night sweats and yellow-brown sputum so I am going to treat his acute bronchitis. Plan-Levaquin, labs for CBC with differential

## 2022-03-03 ENCOUNTER — Telehealth: Payer: Self-pay

## 2022-03-03 ENCOUNTER — Other Ambulatory Visit: Payer: Self-pay

## 2022-03-03 DIAGNOSIS — Z86010 Personal history of colonic polyps: Secondary | ICD-10-CM

## 2022-03-03 MED ORDER — NA SULFATE-K SULFATE-MG SULF 17.5-3.13-1.6 GM/177ML PO SOLN: 354.0000 mL | Freq: Once | ORAL | 0 refills | Status: AC

## 2022-03-03 NOTE — Telephone Encounter (Signed)
Gastroenterology Pre-Procedure Review  Request Date: 05/05/2022 Requesting Physician: Dr. Marius Ditch  PATIENT REVIEW QUESTIONS: The patient responded to the following health history questions as indicated:    1. Are you having any GI issues? no 2. Do you have a personal history of Polyps? Yes last colonoscopy  3. Do you have a family history of Colon Cancer or Polyps? no 4. Diabetes Mellitus? Metformin Novolin. States she is not taking the [place]  5. Joint replacements in the past 12 months?no 6. Major health problems in the past 3 months?no 7. Any artificial heart valves, MVP, or defibrillator?no    MEDICATIONS & ALLERGIES:    Patient reports the following regarding taking any anticoagulation/antiplatelet therapy:   Plavix, Coumadin, Eliquis, Xarelto, Lovenox, Pradaxa, Brilinta, or Effient? no Aspirin? no  Patient confirms/reports the following medications:  Current Outpatient Medications  Medication Sig Dispense Refill   Accu-Chek Softclix Lancets lancets TEST BLOOD SUGAR TWICE DAILY 200 each 11   albuterol (PROVENTIL) (2.5 MG/3ML) 0.083% nebulizer solution Take 3 mLs (2.5 mg total) by nebulization every 6 (six) hours as needed for wheezing or shortness of breath. 75 mL 5   albuterol (VENTOLIN HFA) 108 (90 Base) MCG/ACT inhaler Inhale 2 puffs into the lungs every 6 (six) hours as needed for wheezing or shortness of breath. 8 g 6   Alcohol Swabs (B-D SINGLE USE SWABS REGULAR) PADS USE  TO TEST BLOOD SUGAR TWICE DAILY 200 each 3   Ascorbic Acid (VITAMIN C) 1000 MG tablet Take 1,000 mg by mouth daily.     aspirin EC 81 MG tablet Take 1 tablet (81 mg total) by mouth daily. Swallow whole. 90 tablet 3   Cholecalciferol (VITAMIN D) 50 MCG (2000 UT) CAPS Take 1,000 Units by mouth daily.     dapagliflozin propanediol (FARXIGA) 5 MG TABS tablet Take 1 tablet (5 mg total) by mouth daily before breakfast. 90 tablet 3   DROPLET PEN NEEDLES 32G X 4 MM MISC 1 Device by Other route daily. 200 each 3    ezetimibe (ZETIA) 10 MG tablet Take 1 tablet (10 mg total) by mouth daily. 90 tablet 3   furosemide (LASIX) 40 MG tablet Take 1 tablet (40 mg total) by mouth daily. 30 tablet 1   gabapentin (NEURONTIN) 300 MG capsule Take by mouth.     hydrochlorothiazide (HYDRODIURIL) 25 MG tablet Take 1 tablet (25 mg total) by mouth daily. 90 tablet 1   Insulin NPH, Human,, Isophane, (NOVOLIN N FLEXPEN) 100 UNIT/ML Kiwkpen Inject under skin 36 units each morning, and 28-30 units each evening 60 mL 3   levofloxacin (LEVAQUIN) 750 MG tablet Take 1 tablet (750 mg total) by mouth daily. 7 tablet 0   levothyroxine (SYNTHROID) 50 MCG tablet Take 1 tablet (50 mcg total) by mouth daily before breakfast. 90 tablet 1   meloxicam (MOBIC) 15 MG tablet Take 0.5-1 tablets (7.5-15 mg total) by mouth daily as needed for pain. 90 tablet 1   metFORMIN (GLUCOPHAGE) 1000 MG tablet Take 1 tablet (1,000 mg total) by mouth 2 (two) times daily. 180 tablet 3   methocarbamol (ROBAXIN) 500 MG tablet TAKE 1 TABLET EVERY 8 HOURS AS NEEDED FOR MUSCLE SPASMS. 90 tablet 3   nystatin (MYCOSTATIN) 100000 UNIT/ML suspension Take 5 mLs (500,000 Units total) by mouth 4 (four) times daily. 60 mL 0   nystatin (MYCOSTATIN/NYSTOP) powder Apply 1 Application topically daily as needed (yeast under belly). 60 g 2   pantoprazole (PROTONIX) 40 MG tablet TAKE 1 TABLET TWICE DAILY 180  tablet 3   potassium chloride SA (KLOR-CON M) 20 MEQ tablet Take 1 tablet (20 mEq total) by mouth daily. You may take an extra tablet (20 mEq total) by mouth as needed when you administer your furosemide (lasix). 135 tablet 1   rosuvastatin (CRESTOR) 20 MG tablet Take 1 tablet (20 mg total) by mouth at bedtime. 90 tablet 1   Tiotropium Bromide-Olodaterol (STIOLTO RESPIMAT) 2.5-2.5 MCG/ACT AERS Inhale 2 puffs into the lungs daily. 4 g 5   TRUE METRIX BLOOD GLUCOSE TEST test strip Use 2x a day 200 each 3   valsartan (DIOVAN) 80 MG tablet Take 1 tablet (80 mg total) by mouth daily.  90 tablet 3   No current facility-administered medications for this visit.    Patient confirms/reports the following allergies:  Allergies  Allergen Reactions   Penicillins Hives    Reaction: 1964    No orders of the defined types were placed in this encounter.   AUTHORIZATION INFORMATION Primary Insurance: 1D#: Group #:  Secondary Insurance: 1D#: Group #:  SCHEDULE INFORMATION: Date:  Time: Location:

## 2022-03-05 ENCOUNTER — Other Ambulatory Visit: Payer: Self-pay | Admitting: Internal Medicine

## 2022-03-05 DIAGNOSIS — B370 Candidal stomatitis: Secondary | ICD-10-CM
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
## 2022-03-09 ENCOUNTER — Ambulatory Visit: Payer: Medicare HMO | Admitting: Acute Care

## 2022-03-10 ENCOUNTER — Telehealth: Payer: Self-pay

## 2022-03-10 NOTE — Telephone Encounter (Signed)
Care Management & Coordination Services Outreach Note  03/10/2022 Name: Tamara [name] MRN: [id number] DOB: December 30, 1945  Referred by: Bo Merino, FNP  Patient had a phone appointment scheduled with clinical pharmacist today.  An unsuccessful telephone outreach was attempted today. The patient was referred to the pharmacist for assistance with medications, care management and care coordination.   Patient will NOT be penalized in any way for missing a Care Management & Coordination Services appointment. The no-show fee does not apply.  Malva Limes, [place] Pharmacist Practitioner  [facility] [phone]

## 2022-03-10 NOTE — Progress Notes (Unsigned)
Care Management & Coordination Services Pharmacy Note  03/10/2022 Name:  Tamara [name] MRN:  [id number] DOB:  Mar 30, 1945  Summary: ***  Recommendations/Changes made from today's visit: ***  Follow up plan: ***   Subjective: Tamara [name] is an 77 y.o. year old female who is a primary patient of Bo Merino, FNP.  The care coordination team was consulted for assistance with disease management and care coordination needs.    {CCMTELEPHONEFACETOFACE:21091510} for {CCMINITIALFOLLOWUPCHOICE:21091511}.  Recent office visits: 03/01/22: Patient presented to Dr. Rosana Berger for follow-up.  12/21/21: Patient presented to [name], PA-C for COPD exacerbation.    Recent consult visits: 03/02/22: Patient presented to Dr Annamaria Boots (Pulmonology) for acute bronchitis. Levaquin  02/16/22: Patient presented to Venia Carbon, NP (Cardiology)  02/12/22: Patient presented to Eric Form, NP (Pulmonology) for acute bronchitis. Doxycycline + Prednisone.   Hospital visits: None in previous 6 months   Objective:  Lab Results  Component Value Date   CREATININE 1.00 01/25/2022   BUN 27 11/27/2021   GFR 59.43 (L) 05/13/2020   EGFR 42 (L) 11/27/2021   GFRNONAA 63 11/15/2019   GFRAA 73 11/15/2019   NA 138 11/27/2021   K 4.6 11/27/2021   CALCIUM 9.9 11/27/2021   CO2 25 11/27/2021   GLUCOSE 174 (H) 11/27/2021    Lab Results  Component Value Date/Time   HGBA1C 7.7 (A) 11/16/2021 01:57 PM   HGBA1C 10.1 (A) 08/11/2021 01:51 PM   HGBA1C 10.8 (H) 03/22/2019 10:20 AM   HGBA1C 8.2 (H) 11/22/2018 12:00 AM   GFR 59.43 (L) 05/13/2020 02:38 PM   MICROALBUR 0.8 10/21/2021 02:31 PM   MICROALBUR 0.5 09/05/2017 08:17 AM    Last diabetic Eye exam:  Lab Results  Component Value Date/Time   HMDIABEYEEXA No Retinopathy 10/06/2021 12:00 AM    Last diabetic Foot exam: No results found for: "HMDIABFOOTEX"   Lab Results  Component Value Date   CHOL 185 08/26/2021   HDL 43 (L) 08/26/2021   LDLCALC  112 (H) 08/26/2021   TRIG 182 (H) 08/26/2021   CHOLHDL 4.3 08/26/2021       Latest Ref Rng & Units 10/21/2021    2:31 PM 08/26/2021   10:10 AM 07/28/2020   10:26 AM  Hepatic Function  Total Protein 6.1 - 8.1 g/dL 6.9  6.9  6.9   AST 10 - 35 U/L 21  22  27   $ ALT 6 - 29 U/L 16  14  20   $ Total Bilirubin 0.2 - 1.2 mg/dL 0.5  0.5  0.5     Lab Results  Component Value Date/Time   TSH 3.34 08/26/2021 10:10 AM   TSH 3.88 07/28/2020 10:26 AM   FREET4 1.2 11/22/2018 12:00 AM       Latest Ref Rng & Units 03/02/2022   10:34 AM 08/26/2021   10:10 AM 07/28/2020   10:26 AM  CBC  WBC 4.0 - 10.5 K/uL 7.1  5.7  6.4   Hemoglobin 12.0 - 15.0 g/dL 10.7  10.9  10.7   Hematocrit 36.0 - 46.0 % 31.9  33.2  34.5   Platelets 150.0 - 400.0 K/uL 135.0  164  154     Lab Results  Component Value Date/Time   VITAMINB12 469 05/26/2017 08:26 AM    Clinical ASCVD: {YES/NO:21197} The 10-year ASCVD risk score (Arnett DK, et al., 2019) is: 33.7%   Values used to calculate the score:     Age: 19 years     Sex: Female  Is Non-Hispanic African American: No     Diabetic: Yes     Tobacco smoker: No     Systolic Blood Pressure: 123XX123 mmHg     Is BP treated: Yes     HDL Cholesterol: 43 mg/dL     Total Cholesterol: 185 mg/dL    ***Other: (CHADS2VASc if Afib, MMRC or CAT for COPD, ACT, DEXA)     03/01/2022    2:26 PM 12/21/2021    3:12 PM 10/21/2021    1:35 PM  Depression screen PHQ 2/9  Decreased Interest 0 0 0  Down, Depressed, Hopeless 0 0 0  PHQ - 2 Score 0 0 0  Altered sleeping 0 0 2  Tired, decreased energy 0 0 3  Change in appetite 0 0 0  Feeling bad or failure about yourself  0 0 1  Trouble concentrating 0 0 3  Moving slowly or fidgety/restless 0 0 0  Suicidal thoughts 0 0 0  PHQ-9 Score 0 0 9  Difficult doing work/chores  Not difficult at all Not difficult at all     Social History   Tobacco Use  Smoking Status Former   Packs/day: 1.00   Years: 40.00   Total pack years: 40.00    Types: Cigarettes   Quit date: 2006   Years since quitting: 18.1  Smokeless Tobacco Never  Tobacco Comments   Smoking cessation materials not required   BP Readings from Last 3 Encounters:  03/02/22 122/68  03/01/22 120/60  02/16/22 138/78   Pulse Readings from Last 3 Encounters:  03/02/22 (!) 104  03/01/22 (!) 118  02/16/22 93   Wt Readings from Last 3 Encounters:  03/02/22 237 lb 6.4 oz (107.7 kg)  03/01/22 233 lb 14.4 oz (106.1 kg)  02/16/22 233 lb (105.7 kg)   BMI Readings from Last 3 Encounters:  03/02/22 38.32 kg/m  03/01/22 37.75 kg/m  02/16/22 37.61 kg/m    Allergies  Allergen Reactions   Penicillins Hives    Reaction: 1964    Medications Reviewed Today     Reviewed by Miguel Dibble, CMA (Certified Medical Assistant) on 03/02/22 at 1000  Med List Status: <None>   Medication Order Taking? Sig Documenting Provider Last Dose Status Informant  Accu-Chek Softclix Lancets lancets [id number] Yes TEST BLOOD SUGAR TWICE DAILY [name], PA-C Taking Active   albuterol (PROVENTIL) (2.5 MG/3ML) 0.083% nebulizer solution [id number] Yes Take 3 mLs (2.5 mg total) by nebulization every 6 (six) hours as needed for wheezing or shortness of breath. Teodora Medici, DO Taking Active   albuterol (VENTOLIN HFA) 108 (90 Base) MCG/ACT inhaler [id number] Yes Inhale 2 puffs into the lungs every 6 (six) hours as needed for wheezing or shortness of breath. Collene Gobble, MD Taking Active   Alcohol Swabs (B-D SINGLE USE SWABS REGULAR) PADS [id number] Yes USE  TO TEST BLOOD SUGAR TWICE DAILY Philemon Kingdom, MD Taking Active   Ascorbic Acid (VITAMIN C) 1000 MG tablet [id number] Yes Take 1,000 mg by mouth daily. [provider] Taking Active Self  aspirin EC 81 MG tablet [id number] Yes Take 1 tablet (81 mg total) by mouth daily. Swallow whole. Freada Bergeron, MD Taking Active   Cholecalciferol (VITAMIN D) 50 MCG (2000 UT) CAPS [id number] Yes Take 1,000 Units by mouth  daily. [provider] Taking Active Self  dapagliflozin propanediol (FARXIGA) 5 MG TABS tablet [id number] Yes Take 1 tablet (5 mg total) by mouth daily before breakfast. Philemon Kingdom, MD Taking Active   doxycycline (VIBRA-TABS)  100 MG tablet [id number]  Take 1 tablet (100 mg total) by mouth 2 (two) times daily. Magdalen Spatz, NP  Consider Medication Status and Discontinue   DROPLET PEN NEEDLES 32G X 4 MM MISC [id number] Yes 1 Device by Other route daily. Philemon Kingdom, MD Taking Active   ezetimibe (ZETIA) 10 MG tablet [id number] Yes Take 1 tablet (10 mg total) by mouth daily. Trudi Ida, NP Taking Active   furosemide (LASIX) 40 MG tablet [id number] Yes Take 1 tablet (40 mg total) by mouth daily. Teodora Medici, DO Taking Active   gabapentin (NEURONTIN) 300 MG capsule [id number] Yes Take by mouth. [provider] Taking Active   hydrochlorothiazide (HYDRODIURIL) 25 MG tablet [id number] Yes Take 1 tablet (25 mg total) by mouth daily. Teodora Medici, DO Taking Active   Insulin NPH, Human,, Isophane, (NOVOLIN N FLEXPEN) 100 UNIT/ML Mayer Masker [id number] Yes Inject under skin 36 units each morning, and 28-30 units each evening Philemon Kingdom, MD Taking Active   levothyroxine (SYNTHROID) 50 MCG tablet [id number] Yes Take 1 tablet (50 mcg total) by mouth daily before breakfast. Teodora Medici, DO Taking Active   meloxicam (MOBIC) 15 MG tablet [id number] Yes Take 0.5-1 tablets (7.5-15 mg total) by mouth daily as needed for pain. [name], PA-C Taking Active   metFORMIN (GLUCOPHAGE) 1000 MG tablet [id number] Yes Take 1 tablet (1,000 mg total) by mouth 2 (two) times daily. Philemon Kingdom, MD Taking Active   methocarbamol (ROBAXIN) 500 MG tablet [id number] Yes TAKE 1 TABLET EVERY 8 HOURS AS NEEDED FOR MUSCLE SPASMS. [name], PA-C Taking Active   nystatin (MYCOSTATIN) 100000 UNIT/ML suspension [id number] Yes Take 5 mLs (500,000 Units total) by mouth 4 (four) times  daily. Teodora Medici, DO Taking Active   nystatin (MYCOSTATIN/NYSTOP) powder [id number] Yes Apply 1 Application topically daily as needed (yeast under belly). Teodora Medici, DO Taking Active   pantoprazole (PROTONIX) 40 MG tablet [id number] Yes TAKE 1 TABLET TWICE DAILY Byrum, Rose Fillers, MD Taking Active   potassium chloride SA (KLOR-CON M) 20 MEQ tablet [id number] Yes Take 1 tablet (20 mEq total) by mouth daily. You may take an extra tablet (20 mEq total) by mouth as needed when you administer your furosemide (lasix). Freada Bergeron, MD Taking Active   predniSONE (DELTASONE) 10 MG tablet [id number]  Prednisone taper; 10 mg tablets: 4 tabs x 2 days, 3 tabs x 2 days, 2 tabs x 2 days 1 tab x 2 days then stop. Magdalen Spatz, NP  Consider Medication Status and Discontinue   rosuvastatin (CRESTOR) 20 MG tablet [id number] Yes Take 1 tablet (20 mg total) by mouth at bedtime. Teodora Medici, DO Taking Active   Tiotropium Bromide-Olodaterol (STIOLTO RESPIMAT) 2.5-2.5 MCG/ACT AERS [id number] Yes Inhale 2 puffs into the lungs daily. Collene Gobble, MD Taking Active   TRUE METRIX BLOOD GLUCOSE TEST test strip [id number] Yes Use 2x a day Philemon Kingdom, MD Taking Active   valsartan (DIOVAN) 80 MG tablet [id number] Yes Take 1 tablet (80 mg total) by mouth daily. [name], PA-C Taking Active             SDOH:  (Social Determinants of Health) assessments and interventions performed: {yes/no:20286} SDOH Interventions    Flowsheet Row Clinical Support from 10/20/2021 in [facility] Most recent reading at 10/20/2021 11:16 AM Chronic Care Management from 01/28/2021 in [facility] Most recent reading at 02/02/2021  1:13 PM Chronic Care Management from 09/18/2020 in [facility]  Most recent reading at 10/17/2020  7:58 AM Clinical Support from 10/16/2020 in [facility] Most recent reading at  10/16/2020 11:43 AM Chronic Care Management from 05/28/2020 in [facility] Most recent reading at 05/28/2020 11:37 AM Chronic Care Management from 11/28/2019 in [facility] Most recent reading at 11/28/2019  2:51 PM  SDOH Interventions        Food Insecurity Interventions Intervention Not Indicated -- -- Intervention Not Indicated -- --  Housing Interventions Intervention Not Indicated -- -- Intervention Not Indicated -- --  Transportation Interventions Intervention Not Indicated -- -- Intervention Not Indicated -- Intervention Not Indicated  Utilities Interventions Intervention Not Indicated -- -- -- -- --  Financial Strain Interventions Intervention Not Indicated Intervention Not Indicated Intervention Not Indicated Intervention Not Indicated Intervention Not Indicated Intervention Not Indicated  Physical Activity Interventions Intervention Not Indicated -- -- Intervention Not Indicated -- --  Stress Interventions Intervention Not Indicated -- -- Intervention Not Indicated -- --  Social Connections Interventions Intervention Not Indicated -- -- Intervention Not Indicated -- --       Medication Assistance: {MEDASSISTANCEINFO:25044}  Medication Access: Within the past 30 days, how often has patient missed a dose of medication? *** Is a pillbox or other method used to improve adherence? {YES/NO:21197} Factors that may affect medication adherence? {CHL DESC; BARRIERS:21522} Are meds synced by current pharmacy? {YES/NO:21197} Are meds delivered by current pharmacy? {YES/NO:21197} Does patient experience delays in picking up medications due to transportation concerns? {YES/NO:21197}  [facility] Reviewed: Is patient disadvantaged to use [facility]?: {YES/NO:21197} Current Rx insurance plan: *** Name and location of Current pharmacy:  [facility] [place] [address] Phone: [phone] Fax: [phone]  [place], [place] [address] [place] [address] Phone: [phone] Fax: [phone]  [id number] [place] [id number]: [phone]: [phone] [facility] services reviewed with patient today?: {YES/NO:21197} Patient requests to transfer care to [facility]?: {YES/NO:21197} Reason patient declined to change pharmacies: {US patient preference:27474}  Compliance/Adherence/Medication fill history: Care Gaps: ***  Star-Rating Drugs: ***   Assessment/Plan  Hypertension (BP goal <140/90) -Controlled -Current treatment: Furosmide 40 mg daily Hydrochlorothiazide 25 mg daily  Valsartan 80 mg daily  -Medications previously tried: NA  -Current home readings: Has not been routinely monitoring -Denies hypotensive/hypertensive symptoms -Educated on Daily salt intake goal < 2300 mg; Importance of home blood pressure monitoring; -Counseled to monitor BP at home weekly, document, and provide log at future appointments  Hyperlipidemia: (LDL goal < 70) -Controlled -Current treatment: Ezetimibe 10 mg daily  Rosuvastatin 20 mg daily  -Medications previously tried: NA  -Educated on Importance of limiting foods high in cholesterol; -Recommended to continue current medication  Diabetes (A1c goal <8%) -Controlled -Managed by Dr. GCruzita Lederer -Current medications: Farxiga 5 mg daily  Metformin 1000 mg twice daily  NPH 36 units AM, 30 units nightly: Appropriate, Effective, Safe, Accessible -Medications previously tried:  -Current home glucose readings fasting glucose: averaging 135  Before supper: Often forgets to check.  -Denies hypoglycemic/hyperglycemic symptoms -Continue current medications  COPD (Goal: control symptoms and prevent exacerbations) -Uncontrolled -Current treatment   Albuterol nebulizer as needed: Appropriate, Effective, Safe, Accessible   Ventolin HFA 2 puffs every 6 hours as needed: Appropriate, Effective, Safe, Accessible  Stiolto 2  puffs daily   -Medications previously tried: Trelegy (worsened cough)  -Pulmonary function testing: FEV1 83%, FEV1/FVC 112%  mixed obstructive and restrictive lung disease -Current COPD Classification:  B (high sx, <2 exacerbations/yr)  -Exacerbations requiring treatment in last 6 months: Yes 4/27 + 12/4    Hypothyroidism (Goal: Maintain stable thyroid function) -Controlled -Current treatment  Levothyroxine 25 mcg daily  -Medications previously tried: NA  -Recommended to continue current medication  GERD (Goal: Prevent heartburn/reflux) -Controlled -Current treatment  Pantoprazole 40 mg daily  -Medications previously tried: NA -Patient with chronic cough of unclear etiology. Per patient reflux symptoms have been well controlled.    -Recommended to continue current medication  ***

## 2022-03-15 ENCOUNTER — Ambulatory Visit: Payer: Medicare HMO | Attending: Cardiology | Admitting: Pharmacist

## 2022-03-15 DIAGNOSIS — E782 Mixed hyperlipidemia: Secondary | ICD-10-CM | POA: Diagnosis not present

## 2022-03-15 NOTE — Progress Notes (Signed)
Patient ID: Tamara [name]                 DOB: 11-27-1945                    MRN: [id number]     HPI: Tamara [name] is a 77 y.o. female patient referred to lipid clinic by Dr Johney Frame. PMH is significant for HTN, T2DM, 3 vessel CAD, aortic atherosclerosis and advanced hepatic cirrhosis noted on chest CT 07/2021, HLD, HTN, obesity, and COPD. First seen by Dr Johney Frame 10/2021 - coronary CTA ordered and ezetimibe '10mg'$  daily added. Coronary CTA done 01/25/22 and showed calcium score of 1198 (95th percentile for age and gender) with extensive plaque but nonobstructive by FFR, as well as cirrhosis.  Pt presents today for follow up. Reports adherence to lipid meds, tolerating them well. Thinks she may have experienced joint pain on rosuvastatin '40mg'$  daily in the past. Has been taking rosuvastatin '20mg'$  daily since 2018 and tolerating well. On ezetimibe since 11/16/21. Reports Lipitor "gave her diabetes." A few of her inhalers are quite expensive. Stopped taking her baby aspirin. Reports rectal bleeding. No bleeding since stopping aspirin 2-3 weeks ago. Not fasting today - ate 2 boiled eggs, 1/2 piece of toast, and 2-3 pieces of hard candy (helps with her dry mouth).  Current Medications: rosuvastatin '20mg'$  daily, ezetimibe '10mg'$  daily Intolerances: atorvastatin - "gave her diabetes," rosuvastatin '40mg'$  daily - potentially joint pain Risk Factors: CAD and elevated calcium score on coronary CTA, HTN, DM, obesity, age LDL goal: '70mg'$ /dL  Diet: 1-2 meals a day. Eats smaller portions. Doesn't eat much meat. Limits fried food. Likes oranges. Gets nauseous after she eats frequently.  Exercise: minimal, breathing issues from COPD  Family History: DM in her maternal grandmother.  Social History: Former smoker 1 PPD for 40 years, quit in 2006. No alcohol or drug use.  Labs: 08/26/21: TC 185, HDL 43, TG 182, LDL 112 (rosuvastatin '20mg'$  daily)  Past Medical History:  Diagnosis Date   Arthritis    knee and  shoulders   Colon polyps 09/05/2017   Diabetes mellitus without complication ([place])    type II   Dyspnea    05/16/20 has had a cough for 2.5 years post Covid   Gastritis 09/05/2017   GERD (gastroesophageal reflux disease)    Hyperlipidemia    Hypertension    patient denies   Hypothyroidism    Panic attack    RUQ pain 03/09/2017   Per patient, she has had RUQ pain 4-5 years that has grown worse in the last few months.    Current Outpatient Medications on File Prior to Visit  Medication Sig Dispense Refill   Accu-Chek Softclix Lancets lancets TEST BLOOD SUGAR TWICE DAILY 200 each 11   albuterol (PROVENTIL) (2.5 MG/3ML) 0.083% nebulizer solution Take 3 mLs (2.5 mg total) by nebulization every 6 (six) hours as needed for wheezing or shortness of breath. 75 mL 5   albuterol (VENTOLIN HFA) 108 (90 Base) MCG/ACT inhaler Inhale 2 puffs into the lungs every 6 (six) hours as needed for wheezing or shortness of breath. 8 g 6   Alcohol Swabs (B-D SINGLE USE SWABS REGULAR) PADS USE  TO TEST BLOOD SUGAR TWICE DAILY 200 each 3   Ascorbic Acid (VITAMIN C) 1000 MG tablet Take 1,000 mg by mouth daily.     aspirin EC 81 MG tablet Take 1 tablet (81 mg total) by mouth daily. Swallow whole. 90 tablet 3   Cholecalciferol (  VITAMIN D) 50 MCG (2000 UT) CAPS Take 1,000 Units by mouth daily.     dapagliflozin propanediol (FARXIGA) 5 MG TABS tablet Take 1 tablet (5 mg total) by mouth daily before breakfast. 90 tablet 3   DROPLET PEN NEEDLES 32G X 4 MM MISC 1 Device by Other route daily. 200 each 3   ezetimibe (ZETIA) 10 MG tablet Take 1 tablet (10 mg total) by mouth daily. 90 tablet 3   furosemide (LASIX) 40 MG tablet Take 1 tablet (40 mg total) by mouth daily. 30 tablet 1   gabapentin (NEURONTIN) 300 MG capsule Take by mouth.     hydrochlorothiazide (HYDRODIURIL) 25 MG tablet Take 1 tablet (25 mg total) by mouth daily. 90 tablet 1   Insulin NPH, Human,, Isophane, (NOVOLIN N FLEXPEN) 100 UNIT/ML Kiwkpen Inject under  skin 36 units each morning, and 28-30 units each evening 60 mL 3   levofloxacin (LEVAQUIN) 750 MG tablet Take 1 tablet (750 mg total) by mouth daily. 7 tablet 0   levothyroxine (SYNTHROID) 50 MCG tablet Take 1 tablet (50 mcg total) by mouth daily before breakfast. 90 tablet 1   meloxicam (MOBIC) 15 MG tablet Take 0.5-1 tablets (7.5-15 mg total) by mouth daily as needed for pain. 90 tablet 1   metFORMIN (GLUCOPHAGE) 1000 MG tablet Take 1 tablet (1,000 mg total) by mouth 2 (two) times daily. 180 tablet 3   methocarbamol (ROBAXIN) 500 MG tablet TAKE 1 TABLET EVERY 8 HOURS AS NEEDED FOR MUSCLE SPASMS. 90 tablet 3   nystatin (MYCOSTATIN) 100000 UNIT/ML suspension Take 5 mLs (500,000 Units total) by mouth 4 (four) times daily. 60 mL 0   nystatin (MYCOSTATIN/NYSTOP) powder Apply 1 Application topically daily as needed (yeast under belly). 60 g 2   pantoprazole (PROTONIX) 40 MG tablet TAKE 1 TABLET TWICE DAILY 180 tablet 3   potassium chloride SA (KLOR-CON M) 20 MEQ tablet Take 1 tablet (20 mEq total) by mouth daily. You may take an extra tablet (20 mEq total) by mouth as needed when you administer your furosemide (lasix). 135 tablet 1   rosuvastatin (CRESTOR) 20 MG tablet Take 1 tablet (20 mg total) by mouth at bedtime. 90 tablet 1   Tiotropium Bromide-Olodaterol (STIOLTO RESPIMAT) 2.5-2.5 MCG/ACT AERS Inhale 2 puffs into the lungs daily. 4 g 5   TRUE METRIX BLOOD GLUCOSE TEST test strip Use 2x a day 200 each 3   valsartan (DIOVAN) 80 MG tablet Take 1 tablet (80 mg total) by mouth daily. 90 tablet 3   No current facility-administered medications on file prior to visit.    Allergies  Allergen Reactions   Penicillins Hives    Reaction: 1964    Assessment/Plan:  1. Hyperlipidemia - LDL 112 on rosuvastatin '20mg'$  daily. May have experienced joint pain on '40mg'$  daily in the past. Will check updated labs today since she started ezetimibe '10mg'$  daily a few months ago. LDL goal < 70 due to CAD. Discussed  Nexlizet or Repatha as options depending on how her labs look today and % further LDL lowering required. Will call pt tomorrow to discuss and finalize plan. Both meds are TIer 3 ($45/month) on her insurance plan which is a bit steep for her. Will need to pursue [employer] for either med.  Pt stopped taking ASA '81mg'$  daily a few weeks ago due to rectal bleeding which has since resolved. Will forward to Dr Johney Frame as an Juluis Rainier.  Marlow Hendrie E. Meziah Blasingame, PharmD, BCACP, [place]  [address] [place], [place] [address] Phone: [phone]; Fax: [phone] 03/15/2022 1:54 PM

## 2022-03-15 NOTE — Patient Instructions (Signed)
Your LDL cholesterol was 112 in August, your goal is < 70. We'll check updated labs today  Medication options include: -Repatha - twice monthly injection in the fatty tissue of your stomach or thigh. Lowers LDL cholesterol by 60% -Nexlizet - daily pill that lowers LDL cholesterol by 40% -Either of these options would likely replace your ezetimibe  Continue taking your rosuvastatin daily as well as your ezetimibe until you hear from me

## 2022-03-16 ENCOUNTER — Telehealth: Payer: Self-pay | Admitting: Pharmacist

## 2022-03-16 LAB — LIPID PANEL
Chol/HDL Ratio: 2.8 ratio (ref 0.0–4.4)
Cholesterol, Total: 140 mg/dL (ref 100–199)
HDL: 50 mg/dL (ref >39)
LDL Chol Calc (NIH): 65 mg/dL (ref 0–99)
Triglycerides: 148 mg/dL (ref 0–149)
VLDL Cholesterol Cal: 25 mg/dL (ref 5–40)

## 2022-03-16 LAB — HEPATIC FUNCTION PANEL
ALT: 13 IU/L (ref 0–32)
AST: 20 IU/L (ref 0–40)
Albumin: 4.3 g/dL (ref 3.8–4.8)
Alkaline Phosphatase: 56 IU/L (ref 44–121)
Bilirubin Total: 0.3 mg/dL (ref 0.0–1.2)
Bilirubin, Direct: 0.11 mg/dL (ref 0.00–0.40)
Total Protein: 6.4 g/dL (ref 6.0–8.5)

## 2022-03-16 LAB — LDL CHOLESTEROL, DIRECT: LDL Direct: 64 mg/dL (ref 0–99)

## 2022-03-16 NOTE — Telephone Encounter (Signed)
Pt with amazing LDL drop of 43% after adding ezetimibe and continuing on rosuvastatin '20mg'$  daily. LDL 64 and now at goal < 70. She is aware to continue rosuvastatin and ezetimibe, does not need add on medication at this time.

## 2022-03-17 ENCOUNTER — Ambulatory Visit (HOSPITAL_COMMUNITY)
Admission: RE | Admit: 2022-03-17 | Discharge: 2022-03-17 | Disposition: A | Discharge status: Home / Self Care | Payer: Medicare HMO | Source: Ambulatory Visit | Attending: Internal Medicine | Admitting: Internal Medicine

## 2022-03-17 DIAGNOSIS — S0990XA Unspecified injury of head, initial encounter: Secondary | ICD-10-CM | POA: Insufficient documentation

## 2022-03-17 DIAGNOSIS — I6782 Cerebral ischemia: Secondary | ICD-10-CM | POA: Insufficient documentation

## 2022-03-17 DIAGNOSIS — R519 Headache, unspecified: Secondary | ICD-10-CM | POA: Diagnosis not present

## 2022-03-18 ENCOUNTER — Other Ambulatory Visit: Payer: Self-pay | Admitting: Internal Medicine

## 2022-03-18 ENCOUNTER — Ambulatory Visit: Payer: Medicare HMO | Admitting: Internal Medicine

## 2022-03-18 DIAGNOSIS — I10 Essential (primary) hypertension: Secondary | ICD-10-CM
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
## 2022-03-24 ENCOUNTER — Ambulatory Visit: Payer: Medicare HMO

## 2022-03-24 DIAGNOSIS — I10 Essential (primary) hypertension: Secondary | ICD-10-CM

## 2022-03-24 DIAGNOSIS — J441 Chronic obstructive pulmonary disease with (acute) exacerbation: Secondary | ICD-10-CM

## 2022-03-24 NOTE — Progress Notes (Signed)
Care Management & Coordination Services Pharmacy Note  03/24/2022 Name:  Tamara [name] MRN:  [id number] DOB:  08/20/1945  Summary: Patient presents for follow-up consult.   She continues to experience moderate-high symptom burden of her COPD and is not back at 100% from her bronchitis. She is back on her Stiolto.  Recommendations/Changes made from today's visit: START PAP for Stiolto.  Follow up plan: CPP follow-up 3 months   Subjective: Tamara [name] is an 77 y.o. year old female who is a primary patient of Bo Merino, FNP.  The care coordination team was consulted for assistance with disease management and care coordination needs.    Engaged with patient by telephone for follow up visit.  Recent office visits: 03/01/22: Patient presented to Dr. Rosana Berger for follow-up.  12/21/21: Patient presented to [name], PA-C for COPD exacerbation.    Recent consult visits: 03/15/22: Patient presented to Dr. Onnie Graham, [facility] (Cardiology) for HLD follow-up.  03/02/22: Patient presented to Dr Annamaria Boots (Pulmonology) for acute bronchitis. Levaquin  02/16/22: Patient presented to Venia Carbon, NP (Cardiology)  02/12/22: Patient presented to Eric Form, NP (Pulmonology) for acute bronchitis. Doxycycline + Prednisone.   Hospital visits: None in previous 6 months   Objective:  Lab Results  Component Value Date   CREATININE 1.00 01/25/2022   BUN 27 11/27/2021   GFR 59.43 (L) 05/13/2020   EGFR 42 (L) 11/27/2021   GFRNONAA 63 11/15/2019   GFRAA 73 11/15/2019   NA 138 11/27/2021   K 4.6 11/27/2021   CALCIUM 9.9 11/27/2021   CO2 25 11/27/2021   GLUCOSE 174 (H) 11/27/2021    Lab Results  Component Value Date/Time   HGBA1C 7.7 (A) 11/16/2021 01:57 PM   HGBA1C 10.1 (A) 08/11/2021 01:51 PM   HGBA1C 10.8 (H) 03/22/2019 10:20 AM   HGBA1C 8.2 (H) 11/22/2018 12:00 AM   GFR 59.43 (L) 05/13/2020 02:38 PM   MICROALBUR 0.8 10/21/2021 02:31 PM   MICROALBUR 0.5 09/05/2017 08:17 AM     Last diabetic Eye exam:  Lab Results  Component Value Date/Time   HMDIABEYEEXA No Retinopathy 10/06/2021 12:00 AM    Last diabetic Foot exam: No results found for: "HMDIABFOOTEX"   Lab Results  Component Value Date   CHOL 140 03/15/2022   HDL 50 03/15/2022   LDLCALC 65 03/15/2022   LDLDIRECT 64 03/15/2022   TRIG 148 03/15/2022   CHOLHDL 2.8 03/15/2022       Latest Ref Rng & Units 03/15/2022    1:51 PM 10/21/2021    2:31 PM 08/26/2021   10:10 AM  Hepatic Function  Total Protein 6.0 - 8.5 g/dL 6.4  6.9  6.9   Albumin 3.8 - 4.8 g/dL 4.3     AST 0 - 40 IU/L '20  21  22   '$ ALT 0 - 32 IU/L '13  16  14   '$ Alk Phosphatase 44 - 121 IU/L 56     Total Bilirubin 0.0 - 1.2 mg/dL 0.3  0.5  0.5   Bilirubin, Direct 0.00 - 0.40 mg/dL 0.11       Lab Results  Component Value Date/Time   TSH 3.34 08/26/2021 10:10 AM   TSH 3.88 07/28/2020 10:26 AM   FREET4 1.2 11/22/2018 12:00 AM       Latest Ref Rng & Units 03/02/2022   10:34 AM 08/26/2021   10:10 AM 07/28/2020   10:26 AM  CBC  WBC 4.0 - 10.5 K/uL 7.1  5.7  6.4   Hemoglobin 12.0 - 15.0  g/dL 10.7  10.9  10.7   Hematocrit 36.0 - 46.0 % 31.9  33.2  34.5   Platelets 150.0 - 400.0 K/uL 135.0  164  154     Lab Results  Component Value Date/Time   VITAMINB12 469 05/26/2017 08:26 AM    Clinical ASCVD: No  The 10-year ASCVD risk score (Arnett DK, et al., 2019) is: 33%   Values used to calculate the score:     Age: 77 years     Sex: Female     Is Non-Hispanic African American: No     Diabetic: Yes     Tobacco smoker: No     Systolic Blood Pressure: 123XX123 mmHg     Is BP treated: Yes     HDL Cholesterol: 50 mg/dL     Total Cholesterol: 140 mg/dL       03/01/2022    2:26 PM 12/21/2021    3:12 PM 10/21/2021    1:35 PM  Depression screen PHQ 2/9  Decreased Interest 0 0 0  Down, Depressed, Hopeless 0 0 0  PHQ - 2 Score 0 0 0  Altered sleeping 0 0 2  Tired, decreased energy 0 0 3  Change in appetite 0 0 0  Feeling bad or failure about  yourself  0 0 1  Trouble concentrating 0 0 3  Moving slowly or fidgety/restless 0 0 0  Suicidal thoughts 0 0 0  PHQ-9 Score 0 0 9  Difficult doing work/chores  Not difficult at all Not difficult at all     Social History   Tobacco Use  Smoking Status Former   Packs/day: 1.00   Years: 40.00   Total pack years: 40.00   Types: Cigarettes   Quit date: 2006   Years since quitting: 18.1  Smokeless Tobacco Never  Tobacco Comments   Smoking cessation materials not required   BP Readings from Last 3 Encounters:  03/02/22 122/68  03/01/22 120/60  02/16/22 138/78   Pulse Readings from Last 3 Encounters:  03/02/22 (!) 104  03/01/22 (!) 118  02/16/22 93   Wt Readings from Last 3 Encounters:  03/02/22 237 lb 6.4 oz (107.7 kg)  03/01/22 233 lb 14.4 oz (106.1 kg)  02/16/22 233 lb (105.7 kg)   BMI Readings from Last 3 Encounters:  03/02/22 38.32 kg/m  03/01/22 37.75 kg/m  02/16/22 37.61 kg/m    Allergies  Allergen Reactions   Penicillins Hives    Reaction: 1965    Medications Reviewed Today     Reviewed by Miguel Dibble, CMA (Certified Medical Assistant) on 03/02/22 at 1000  Med List Status: <None>   Medication Order Taking? Sig Documenting Provider Last Dose Status Informant  Accu-Chek Softclix Lancets lancets [id number] Yes TEST BLOOD SUGAR TWICE DAILY [name], PA-C Taking Active   albuterol (PROVENTIL) (2.5 MG/3ML) 0.083% nebulizer solution [id number] Yes Take 3 mLs (2.5 mg total) by nebulization every 6 (six) hours as needed for wheezing or shortness of breath. Teodora Medici, DO Taking Active   albuterol (VENTOLIN HFA) 108 (90 Base) MCG/ACT inhaler [id number] Yes Inhale 2 puffs into the lungs every 6 (six) hours as needed for wheezing or shortness of breath. Collene Gobble, MD Taking Active   Alcohol Swabs (B-D SINGLE USE SWABS REGULAR) PADS [id number] Yes USE  TO TEST BLOOD SUGAR TWICE DAILY Philemon Kingdom, MD Taking Active   Ascorbic Acid (VITAMIN C) 1000  MG tablet [id number] Yes Take 1,000 mg by mouth daily. [provider] Taking Active Self  aspirin EC 81 MG tablet [id number] Yes Take 1 tablet (81 mg total) by mouth daily. Swallow whole. Freada Bergeron, MD Taking Active   Cholecalciferol (VITAMIN D) 50 MCG (2000 UT) CAPS [id number] Yes Take 1,000 Units by mouth daily. [provider] Taking Active Self  dapagliflozin propanediol (FARXIGA) 5 MG TABS tablet [id number] Yes Take 1 tablet (5 mg total) by mouth daily before breakfast. Philemon Kingdom, MD Taking Active   doxycycline (VIBRA-TABS) 100 MG tablet [id number]  Take 1 tablet (100 mg total) by mouth 2 (two) times daily. Magdalen Spatz, NP  Consider Medication Status and Discontinue   DROPLET PEN NEEDLES 32G X 4 MM MISC [id number] Yes 1 Device by Other route daily. Philemon Kingdom, MD Taking Active   ezetimibe (ZETIA) 10 MG tablet [id number] Yes Take 1 tablet (10 mg total) by mouth daily. Trudi Ida, NP Taking Active   furosemide (LASIX) 40 MG tablet [id number] Yes Take 1 tablet (40 mg total) by mouth daily. Teodora Medici, DO Taking Active   gabapentin (NEURONTIN) 300 MG capsule [id number] Yes Take by mouth. [provider] Taking Active   hydrochlorothiazide (HYDRODIURIL) 25 MG tablet [id number] Yes Take 1 tablet (25 mg total) by mouth daily. Teodora Medici, DO Taking Active   Insulin NPH, Human,, Isophane, (NOVOLIN N FLEXPEN) 100 UNIT/ML Mayer Masker [id number] Yes Inject under skin 36 units each morning, and 28-30 units each evening Philemon Kingdom, MD Taking Active   levothyroxine (SYNTHROID) 50 MCG tablet [id number] Yes Take 1 tablet (50 mcg total) by mouth daily before breakfast. Teodora Medici, DO Taking Active   meloxicam (MOBIC) 15 MG tablet [id number] Yes Take 0.5-1 tablets (7.5-15 mg total) by mouth daily as needed for pain. [name], PA-C Taking Active   metFORMIN (GLUCOPHAGE) 1000 MG tablet [id number] Yes Take 1 tablet (1,000 mg total) by mouth  2 (two) times daily. Philemon Kingdom, MD Taking Active   methocarbamol (ROBAXIN) 500 MG tablet [id number] Yes TAKE 1 TABLET EVERY 8 HOURS AS NEEDED FOR MUSCLE SPASMS. [name], PA-C Taking Active   nystatin (MYCOSTATIN) 100000 UNIT/ML suspension [id number] Yes Take 5 mLs (500,000 Units total) by mouth 4 (four) times daily. Teodora Medici, DO Taking Active   nystatin (MYCOSTATIN/NYSTOP) powder [id number] Yes Apply 1 Application topically daily as needed (yeast under belly). Teodora Medici, DO Taking Active   pantoprazole (PROTONIX) 40 MG tablet [id number] Yes TAKE 1 TABLET TWICE DAILY Byrum, Rose Fillers, MD Taking Active   potassium chloride SA (KLOR-CON M) 20 MEQ tablet [id number] Yes Take 1 tablet (20 mEq total) by mouth daily. You may take an extra tablet (20 mEq total) by mouth as needed when you administer your furosemide (lasix). Freada Bergeron, MD Taking Active   predniSONE (DELTASONE) 10 MG tablet [id number]  Prednisone taper; 10 mg tablets: 4 tabs x 2 days, 3 tabs x 2 days, 2 tabs x 2 days 1 tab x 2 days then stop. Magdalen Spatz, NP  Consider Medication Status and Discontinue   rosuvastatin (CRESTOR) 20 MG tablet [id number] Yes Take 1 tablet (20 mg total) by mouth at bedtime. Teodora Medici, DO Taking Active   Tiotropium Bromide-Olodaterol (STIOLTO RESPIMAT) 2.5-2.5 MCG/ACT AERS [id number] Yes Inhale 2 puffs into the lungs daily. Collene Gobble, MD Taking Active   TRUE METRIX BLOOD GLUCOSE TEST test strip [id number] Yes Use 2x a day Philemon Kingdom, MD Taking Active   valsartan (DIOVAN) 80 MG tablet [id number] Yes Take 1 tablet (80 mg total) by mouth daily. [name], PA-C Taking  Active             SDOH:  (Social Determinants of Health) assessments and interventions performed: Yes SDOH Interventions    Flowsheet Row Clinical Support from 10/20/2021 in [facility] Most recent reading at 10/20/2021 11:16 AM Chronic Care Management from 01/28/2021  in [facility] Most recent reading at 02/02/2021  1:13 PM Chronic Care Management from 09/18/2020 in [facility] Most recent reading at 10/17/2020  7:58 AM Clinical Support from 10/16/2020 in [facility] Most recent reading at 10/16/2020 11:43 AM Chronic Care Management from 05/28/2020 in [facility] Most recent reading at 05/28/2020 11:37 AM Chronic Care Management from 11/28/2019 in [facility] Most recent reading at 11/28/2019  2:51 PM  SDOH Interventions        Food Insecurity Interventions Intervention Not Indicated -- -- Intervention Not Indicated -- --  Housing Interventions Intervention Not Indicated -- -- Intervention Not Indicated -- --  Transportation Interventions Intervention Not Indicated -- -- Intervention Not Indicated -- Intervention Not Indicated  Utilities Interventions Intervention Not Indicated -- -- -- -- --  Financial Strain Interventions Intervention Not Indicated Intervention Not Indicated Intervention Not Indicated Intervention Not Indicated Intervention Not Indicated Intervention Not Indicated  Physical Activity Interventions Intervention Not Indicated -- -- Intervention Not Indicated -- --  Stress Interventions Intervention Not Indicated -- -- Intervention Not Indicated -- --  Social Connections Interventions Intervention Not Indicated -- -- Intervention Not Indicated -- --       Medication Assistance:  Wilder Glade [place] through [place].  Enrollment ends Dec 123456  Application for Stiolto  medication assistance program. in process.  Anticipated assistance start date TBD.  See plan of care for additional detail.  Medication Access: Within the past 30 days, how often has patient missed a dose of medication? None Is a pillbox or other method used to improve adherence? Yes  Factors that may affect medication  adherence? no barriers identified Are meds synced by current pharmacy? No  Are meds delivered by current pharmacy? Yes  Does patient experience delays in picking up medications due to transportation concerns? No   [facility] Services Reviewed: Is patient disadvantaged to use [facility]?: Yes  Current Rx insurance plan: [employer] Name and location of Current pharmacy:  [facility] - [address], [place] [address] Phone: [phone] Fax: [phone]  [place], [place] - [address] [place] [address] Phone: [phone] Fax: [phone]  [id number] [place] [id number]: [phone]: [phone] [facility] services reviewed with patient today?: No  Patient requests to transfer care to [facility]?: No  Reason patient declined to change pharmacies: Disadvantaged due to insurance/mail order  Compliance/Adherence/Medication fill history: Care Gaps: Colonoscopy  Star-Rating Drugs:  Rosuvastatin 20 mg: Last filled 03/02/22 for 90-DS  Valsartan 80 mg: Last filled 02/25/22 for 90-DS   Assessment/Plan  Hypertension (BP goal <140/90) -Controlled -Current treatment: Furosmide 40 mg daily Hydrochlorothiazide 25 mg daily  Valsartan 80 mg daily  -Medications previously tried: NA  -Current home readings: Has not been routinely monitoring -Denies hypotensive/hypertensive symptoms -Educated on Daily salt intake goal < 2300 mg; Importance of home blood pressure monitoring; -Counseled to monitor BP at home weekly, document, and provide log at  future appointments  Hyperlipidemia: (LDL goal < 70) -Controlled -Current treatment: Ezetimibe 10 mg daily  Rosuvastatin 20 mg daily  -Medications previously tried: NA  -Educated on Importance of limiting foods high in  cholesterol; -Recommended to continue current medication  Diabetes (A1c goal <8%) -Controlled -Managed by Dr. Cruzita Lederer  -Current medications: Farxiga 5 mg daily  Metformin 1000 mg twice daily  NPH 36 units AM, 30 units nightly: Appropriate, Effective, Safe, Accessible -Medications previously tried:  -Current home glucose readings fasting glucose: averaging 135  Before supper: Often forgets to check.  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: E (exacerbation leading to hospitalization OR 2+ moderate exacerbations) -Exacerbations requiring treatment in last 6 months: Multiple, last Dec 2023 -Frequency of nebulizer inhaler use: 2-3 times weekly -Frequency of rescue inhaler use: 5-7 times weekly  Total CAT Score: 19 -Start PAP for Stiolto   Hypothyroidism (Goal: Maintain stable thyroid function) -Controlled -Current treatment  Levothyroxine 25 mcg daily  -Medications previously tried: NA  -Recommended to continue current medication  GERD (Goal: Prevent heartburn/reflux) -Controlled -Current treatment  Pantoprazole 40 mg daily  -Medications previously tried: NA -Patient with chronic cough of unclear etiology. Per patient reflux symptoms have been well controlled.    -Recommended to continue current medication  Malva Limes, [place] Pharmacist Practitioner  [facility] [phone]

## 2022-03-29 ENCOUNTER — Telehealth: Payer: Self-pay

## 2022-03-29 NOTE — Progress Notes (Signed)
Care Coordination Pharmacy Assistant   Name: Tamara [name]  MRN: [id number] DOB: 19-Jul-1945  Reason for Encounter: Patient Assistance Application Stiolto     Application started and will be mailed to the patient's home address for her to complete. Once completed patient instructed to give her Pulmonary provider the application for them to complete and fax over for processing. Patient can follow-up with Pulmonary to get status updates of her application. Medications: Outpatient Encounter Medications as of 03/29/2022  Medication Sig   Accu-Chek Softclix Lancets lancets TEST BLOOD SUGAR TWICE DAILY   albuterol (PROVENTIL) (2.5 MG/3ML) 0.083% nebulizer solution Take 3 mLs (2.5 mg total) by nebulization every 6 (six) hours as needed for wheezing or shortness of breath.   albuterol (VENTOLIN HFA) 108 (90 Base) MCG/ACT inhaler Inhale 2 puffs into the lungs every 6 (six) hours as needed for wheezing or shortness of breath.   Alcohol Swabs (B-D SINGLE USE SWABS REGULAR) PADS USE  TO TEST BLOOD SUGAR TWICE DAILY   Ascorbic Acid (VITAMIN C) 1000 MG tablet Take 1,000 mg by mouth daily.   Cholecalciferol (VITAMIN D) 50 MCG (2000 UT) CAPS Take 1,000 Units by mouth daily.   dapagliflozin propanediol (FARXIGA) 5 MG TABS tablet Take 1 tablet (5 mg total) by mouth daily before breakfast.   DROPLET PEN NEEDLES 32G X 4 MM MISC 1 Device by Other route daily.   ezetimibe (ZETIA) 10 MG tablet Take 1 tablet (10 mg total) by mouth daily.   furosemide (LASIX) 40 MG tablet Take 1 tablet (40 mg total) by mouth daily.   gabapentin (NEURONTIN) 300 MG capsule Take by mouth.   hydrochlorothiazide (HYDRODIURIL) 25 MG tablet TAKE 1 TABLET EVERY DAY   Insulin NPH, Human,, Isophane, (NOVOLIN N FLEXPEN) 100 UNIT/ML Kiwkpen Inject under skin 36 units each morning, and 28-30 units each evening   levothyroxine (SYNTHROID) 50 MCG tablet Take 1 tablet (50 mcg total) by mouth daily before breakfast.   meloxicam (MOBIC) 15 MG  tablet Take 0.5-1 tablets (7.5-15 mg total) by mouth daily as needed for pain.   metFORMIN (GLUCOPHAGE) 1000 MG tablet Take 1 tablet (1,000 mg total) by mouth 2 (two) times daily.   methocarbamol (ROBAXIN) 500 MG tablet TAKE 1 TABLET EVERY 8 HOURS AS NEEDED FOR MUSCLE SPASMS.   nystatin (MYCOSTATIN/NYSTOP) powder Apply 1 Application topically daily as needed (yeast under belly).   pantoprazole (PROTONIX) 40 MG tablet TAKE 1 TABLET TWICE DAILY   potassium chloride SA (KLOR-CON M) 20 MEQ tablet Take 1 tablet (20 mEq total) by mouth daily. You may take an extra tablet (20 mEq total) by mouth as needed when you administer your furosemide (lasix).   rosuvastatin (CRESTOR) 20 MG tablet Take 1 tablet (20 mg total) by mouth at bedtime.   Tiotropium Bromide-Olodaterol (STIOLTO RESPIMAT) 2.5-2.5 MCG/ACT AERS Inhale 2 puffs into the lungs daily.   TRUE METRIX BLOOD GLUCOSE TEST test strip Use 2x a day   valsartan (DIOVAN) 80 MG tablet Take 1 tablet (80 mg total) by mouth daily.   No facility-administered encounter medications on file as of 03/29/2022.   Lynann Bologna, CPA/CMA Clinical Pharmacist Assistant Phone: [phone]

## 2022-04-13 ENCOUNTER — Ambulatory Visit: Payer: Medicare HMO | Admitting: Emergency Medicine

## 2022-04-13 ENCOUNTER — Encounter: Payer: Self-pay | Admitting: Emergency Medicine

## 2022-04-13 VITALS — BP 132/76 | HR 103 | Temp 98.1°F | Ht 66.0 in | Wt 240.8 lb

## 2022-04-13 DIAGNOSIS — K219 Gastro-esophageal reflux disease without esophagitis: Secondary | ICD-10-CM | POA: Diagnosis not present

## 2022-04-13 DIAGNOSIS — R053 Chronic cough: Secondary | ICD-10-CM

## 2022-04-13 DIAGNOSIS — J984 Other disorders of lung: Secondary | ICD-10-CM | POA: Diagnosis not present

## 2022-04-13 DIAGNOSIS — J441 Chronic obstructive pulmonary disease with (acute) exacerbation: Secondary | ICD-10-CM | POA: Diagnosis not present

## 2022-04-13 MED ORDER — BREZTRI AEROSPHERE 160-9-4.8 MCG/ACT IN AERO: 2.0000 | INHALATION_SPRAY | Freq: Two times a day (BID) | RESPIRATORY_TRACT | 0 refills | Status: DC

## 2022-04-13 NOTE — Assessment & Plan Note (Signed)
Continue the PPI as above, consider increasing back to twice daily.  Add back allergy medication, start loratadine 10 mg once daily

## 2022-04-13 NOTE — Patient Instructions (Addendum)
Temporarily stop your Stiolto so we can try an alternative as below We will temporarily try starting Breztri 2 puffs twice a day.  Rinse and gargle after use this medication.  Keep track of whether the medication helps you.  If so we will send a prescription to your pharmacy and see how well it is covered by her insurance. Keep your albuterol available to use 2 puffs if needed for shortness of breath, chest tightness, wheezing. Continue your Protonix 40 mg once daily.  If you have breakthrough reflux or if your cough increases you could consider increasing this to twice a day to see if you get benefit Try starting loratadine 10 mg (generic Claritin) once daily.  Hopefully this will help with your mucus and cough We will plan to repeat your CT scan of the chest in July 2024 to compare with priors. Follow Dr. Lamonte Sakai in July after your CT scan so we can review the results together.

## 2022-04-13 NOTE — Addendum Note (Signed)
Addended by: Gavin Potters R on: 04/13/2022 11:40 AM   Modules accepted: Orders

## 2022-04-13 NOTE — Assessment & Plan Note (Signed)
Temporarily stop your Stiolto so we can try an alternative as below We will temporarily try starting Breztri 2 puffs twice a day.  Rinse and gargle after use this medication.  Keep track of whether the medication helps you.  If so we will send a prescription to your pharmacy and see how well it is covered by her insurance. Keep your albuterol available to use 2 puffs if needed for shortness of breath, chest tightness, wheezing

## 2022-04-13 NOTE — Assessment & Plan Note (Signed)
Continue Protonix once daily.  She has been on this twice daily in the past.  May need to go back up on this if her cough persists
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
## 2022-04-13 NOTE — Assessment & Plan Note (Signed)
Negative on bronchoscopy, negative PET scan.  Question whether this is rounded atelectasis.  We have been doing surveillance, next scan to be done in July 2024

## 2022-04-14 ENCOUNTER — Other Ambulatory Visit: Payer: Medicare HMO

## 2022-04-14 ENCOUNTER — Other Ambulatory Visit: Payer: Self-pay | Admitting: Family Medicine

## 2022-04-14 DIAGNOSIS — M25512 Pain in left shoulder: Secondary | ICD-10-CM

## 2022-04-15 ENCOUNTER — Other Ambulatory Visit: Payer: Self-pay

## 2022-04-15 DIAGNOSIS — R0602 Shortness of breath: Secondary | ICD-10-CM

## 2022-04-15 DIAGNOSIS — R072 Precordial pain: Secondary | ICD-10-CM

## 2022-04-15 DIAGNOSIS — R600 Localized edema: Secondary | ICD-10-CM

## 2022-04-15 MED ORDER — POTASSIUM CHLORIDE CRYS ER 20 MEQ PO TBCR: 20.0000 meq | EXTENDED_RELEASE_TABLET | Freq: Every day | ORAL | 3 refills | Status: DC

## 2022-04-28 ENCOUNTER — Telehealth: Payer: Self-pay

## 2022-04-28 NOTE — Telephone Encounter (Signed)
Per Verlon Au with ENDO Patient is scheduled for Colonoscopy with Dr Allegra Lai 05-05-22. She is canceling this appointment with Dr Allegra Lai and is going to see Dr Mia Creek in August and schedule with him. I will take off of our schedule/ Dr Verdis Prime schedule. Thanks

## 2022-04-29 ENCOUNTER — Ambulatory Visit: Payer: Self-pay

## 2022-04-29 ENCOUNTER — Other Ambulatory Visit: Payer: Self-pay

## 2022-04-29 ENCOUNTER — Encounter: Payer: Self-pay | Admitting: Internal Medicine

## 2022-04-29 ENCOUNTER — Ambulatory Visit (INDEPENDENT_AMBULATORY_CARE_PROVIDER_SITE_OTHER): Payer: Medicare HMO | Admitting: Internal Medicine

## 2022-04-29 ENCOUNTER — Emergency Department
Admission: EM | Admit: 2022-04-29 | Discharge: 2022-04-29 | Disposition: A | Discharge status: Home / Self Care | Payer: Medicare HMO | Attending: Emergency Medicine | Admitting: Emergency Medicine

## 2022-04-29 VITALS — BP 120/68 | HR 109 | Temp 97.8°F | Resp 18 | Ht 66.0 in | Wt 356.8 lb

## 2022-04-29 DIAGNOSIS — Z794 Long term (current) use of insulin: Secondary | ICD-10-CM | POA: Diagnosis not present

## 2022-04-29 DIAGNOSIS — K644 Residual hemorrhoidal skin tags: Secondary | ICD-10-CM | POA: Diagnosis not present

## 2022-04-29 DIAGNOSIS — K625 Hemorrhage of anus and rectum: Secondary | ICD-10-CM | POA: Diagnosis not present

## 2022-04-29 DIAGNOSIS — J449 Chronic obstructive pulmonary disease, unspecified: Secondary | ICD-10-CM | POA: Insufficient documentation

## 2022-04-29 DIAGNOSIS — E039 Hypothyroidism, unspecified: Secondary | ICD-10-CM | POA: Diagnosis not present

## 2022-04-29 DIAGNOSIS — I2510 Atherosclerotic heart disease of native coronary artery without angina pectoris: Secondary | ICD-10-CM | POA: Insufficient documentation

## 2022-04-29 DIAGNOSIS — I10 Essential (primary) hypertension: Secondary | ICD-10-CM | POA: Diagnosis not present

## 2022-04-29 DIAGNOSIS — E119 Type 2 diabetes mellitus without complications: Secondary | ICD-10-CM | POA: Insufficient documentation

## 2022-04-29 LAB — HEMOCCULT GUIAC POC 1CARD (OFFICE): Fecal Occult Blood, POC: POSITIVE — AB

## 2022-04-29 LAB — CBC
HCT: 33.1 % — ABNORMAL LOW (ref 36.0–46.0)
Hemoglobin: 10.6 g/dL — ABNORMAL LOW (ref 12.0–15.0)
MCH: 26 pg (ref 26.0–34.0)
MCHC: 32 g/dL (ref 30.0–36.0)
MCV: 81.1 fL (ref 80.0–100.0)
Platelets: 182 10*3/uL (ref 150–400)
RBC: 4.08 MIL/uL (ref 3.87–5.11)
RDW: 14.6 % (ref 11.5–15.5)
WBC: 7 10*3/uL (ref 4.0–10.5)
nRBC: 0 % (ref 0.0–0.2)

## 2022-04-29 LAB — COMPREHENSIVE METABOLIC PANEL
ALT: 16 U/L (ref 0–44)
AST: 29 U/L (ref 15–41)
Albumin: 4 g/dL (ref 3.5–5.0)
Alkaline Phosphatase: 45 U/L (ref 38–126)
Anion gap: 10 (ref 5–15)
BUN: 16 mg/dL (ref 8–23)
CO2: 22 mmol/L (ref 22–32)
Calcium: 10.2 mg/dL (ref 8.9–10.3)
Chloride: 104 mmol/L (ref 98–111)
Creatinine, Ser: 0.98 mg/dL (ref 0.44–1.00)
GFR, Estimated: >60 mL/min (ref >60)
Glucose, Bld: 176 mg/dL — ABNORMAL HIGH (ref 70–99)
Potassium: 4.2 mmol/L (ref 3.5–5.1)
Sodium: 136 mmol/L (ref 135–145)
Total Bilirubin: 0.7 mg/dL (ref 0.3–1.2)
Total Protein: 7.3 g/dL (ref 6.5–8.1)

## 2022-04-29 LAB — TYPE AND SCREEN
ABO/RH(D): O POS
Antibody Screen: NEGATIVE

## 2022-04-29 MED ORDER — NA SULFATE-K SULFATE-MG SULF 17.5-3.13-1.6 GM/177ML PO SOLN: 354.0000 mL | Freq: Once | ORAL | 0 refills | Status: AC

## 2022-04-29 NOTE — Telephone Encounter (Signed)
     Chief Complaint: Bright red rectal bleeding Symptoms: Bleeding, feels weak Frequency: 5 days Pertinent Negatives: Patient denies  Disposition: [] ED /[] Urgent Care (no appt availability in office) / [x] Appointment(In office/virtual)/ []  [facility]/ [] Home Care/ [] Refused Recommended Disposition /[] [facility]/ []  Follow-up with PCP Additional Notes:    Reason for Disposition . MODERATE rectal bleeding (small blood clots, passing blood without stool, or toilet water turns red)  Answer Assessment - Initial Assessment Questions 1. APPEARANCE of BLOOD: "What color is it?" "Is it passed separately, on the surface of the stool, or mixed in with the stool?"      Bright red with clots 2. AMOUNT: "How much blood was passed?"      Large 3. FREQUENCY: "How many times has blood been passed with the stools?"      6-7 times 4. ONSET: "When was the blood first seen in the stools?" (Days or weeks)      5 days ago 5. DIARRHEA: "Is there also some diarrhea?" If Yes, ask: "How many diarrhea stools in the past 24 hours?"      No 6. CONSTIPATION: "Do you have constipation?" If Yes, ask: "How bad is it?"     No 7. RECURRENT SYMPTOMS: "Have you had blood in your stools before?" If Yes, ask: "When was the last time?" and "What happened that time?"      Yes, but stopped 8. BLOOD THINNERS: "Do you take any blood thinners?" (e.g., Coumadin/warfarin, Pradaxa/dabigatran, aspirin)     No 9. OTHER SYMPTOMS: "Do you have any other symptoms?"  (e.g., abdomen pain, vomiting, dizziness, fever)     Feels tired 10. PREGNANCY: "Is there any chance you are pregnant?" "When was your last menstrual period?"       No  Protocols used: Rectal Bleeding-A-AH

## 2022-04-29 NOTE — ED Notes (Signed)
Blue top tube sent to lab as well. 

## 2022-04-29 NOTE — ED Provider Notes (Signed)
[facility] Provider Note    Event Date/Time   First MD Initiated Contact with Patient 04/29/22 1213     (approximate)   History   Rectal Bleeding   HPI  Tamara [name] is a 77 y.o. female  with pmh DM, HTN, HLD who p/w rectal bleeding. Patient notes that for the last 6 days she has had bright red blood per rectum. Says it happen any time she urinates or has a BM. Stool is brown and blood has never been mixed with the stool. She denies straining, pain with Bms, or constipation. Has hx of external hemorrhoids. Has had similar bleeding in the past. She was supposed to have a colonoscopy next week but it is cancelled. Not on blood thinners.      Past Medical History:  Diagnosis Date   Arthritis    knee and shoulders   Colon polyps 09/05/2017   Diabetes mellitus without complication    type II   Dyspnea    05/16/20 has had a cough for 2.5 years post Covid   Gastritis 09/05/2017   GERD (gastroesophageal reflux disease)    Hyperlipidemia    Hypertension    patient denies   Hypothyroidism    Panic attack    RUQ pain 03/09/2017   Per patient, she has had RUQ pain 4-5 years that has grown worse in the last few months.    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 05/13/2020   Chronic cough 05/13/2020   COPD (chronic obstructive pulmonary disease) 05/13/2020   Anemia of chronic disease 09/05/2017   Hypertension 07/14/2016    Hyperlipemia 07/14/2016   Bilateral lower extremity edema 07/14/2016   GERD (gastroesophageal reflux disease) 07/14/2016     Physical Exam  Triage Vital Signs: ED Triage Vitals  Enc Vitals Group     BP 04/29/22 1116 (!) 145/65     Pulse Rate 04/29/22 1116 91     Resp 04/29/22 1116 20     Temp 04/29/22 1116 98.5 F (36.9 C)     Temp Source 04/29/22 1116 Oral     SpO2 04/29/22 1116 98 %     Weight 04/29/22 1117 236 lb (107 kg)     Height 04/29/22 1117 5\' 6"  (1.676 m)     Head Circumference --      Peak Flow --      Pain Score 04/29/22 1117 0     Pain Loc --      Pain Edu? --      Excl. in GC? --     Most recent vital signs: Vitals:   04/29/22 1116  BP: (!) 145/65  Pulse: 91  Resp: 20  Temp: 98.5 F (36.9 C)  SpO2: 98%     General: Awake, no distress.  CV:  Good peripheral perfusion.  Resp:  Normal effort.  Abd:  No distention. Soft, non ttp Neuro:             Awake, Alert, Oriented x 3  Other:  External hemorrhoids  with dried blood around the anus, no gross blood on DRE   ED Results / Procedures / Treatments  Labs (all labs ordered are listed, but only abnormal results are displayed) Labs Reviewed  COMPREHENSIVE METABOLIC PANEL - Abnormal; Notable for the following components:      Result Value   Glucose, Bld 176 (*)    All other components within normal limits  CBC - Abnormal; Notable for the following components:   Hemoglobin 10.6 (*)    HCT 33.1 (*)    All other components within normal limits  TYPE AND SCREEN     EKG     RADIOLOGY    PROCEDURES:  Critical Care performed: No  Procedures   MEDICATIONS ORDERED IN ED: Medications - No data to display   IMPRESSION / MDM / ASSESSMENT AND PLAN / ED COURSE  I reviewed the triage vital signs and the nursing notes.                              Patient's presentation is most consistent with acute presentation with potential threat to life or bodily function.  Differential diagnosis  includes, but is not limited to, hemorrhoidal bleeding, fissure, rectal polyp, mass, less likely diverticular   77 yo female p/w 6 days of BRB per rectum. Blood does fill the toilet and is pretty much with every episode of stool and urination but it is not mixed with the stool which is brown. Patient is HD stable. On exam she has external hemorrhoids with no active bleeding on DRE. Hgb today after 6 days of bleeding is unchanged from 1 year ago which is reassuring that there is not a large volume of blood loss. Given blood is not mixed with the stool, that she has had 6 days and is HD stable with no change in hgb I feel that this is more likely to be hemorrhoidal bleeding as opposed to diverticular. Patient was actually supposed to have a colonoscopy on 4/17, but for some reason this was rescheduled for August. I reached out to Dr. Allegra Lai and was able to get the colonoscopy back on the schedule for next week. I think patient can safely be dc'd. Discussed return precautions for increased volume of bleeding or blood mixed with stool.        FINAL CLINICAL IMPRESSION(S) / ED DIAGNOSES   Final diagnoses:  Rectal bleeding     Rx / DC Orders   ED Discharge Orders     None        Note:  This document was prepared using Dragon voice recognition software and may include unintentional dictation errors.   Georga Hacking, MD 04/29/22 2059

## 2022-04-29 NOTE — Progress Notes (Addendum)
Acute Office Visit  Subjective:     Patient ID: Tamara [name], female    DOB: 06-20-1945, 77 y.o.   MRN: [id number]  Chief Complaint  Patient presents with   Rectal Bleeding    X5 days    Rectal Bleeding  Pertinent negatives include no fever, no abdominal pain, no diarrhea, no nausea, no vomiting and no chest pain.   Patient is in today for rectal bleeding. She is reporting bright red blood with stools but also whenever she sits on the toilet it will "pour out". Notices clots of blood on the toilet paper. Denies dark stools or changes in stool caliber, no diarrhea or constipation. Is feeling weak and fatigue. Is not on blood thinners. Does have Mobic on her medications list. Has had symptoms similar in the past but only lasted 2 days and resolved at that time. This occurred after she starting taking aspirin but she is no longer on aspirin. Last colonoscopy/EGD 02/08/2017.    Review of Systems  Constitutional:  Positive for malaise/fatigue. Negative for chills and fever.  Respiratory:  Negative for shortness of breath.   Cardiovascular:  Negative for chest pain.  Gastrointestinal:  Positive for blood in stool and hematochezia. Negative for abdominal pain, constipation, diarrhea, nausea and vomiting.        Objective:    BP 120/68   Pulse (!) 109   Temp 97.8 F (36.6 C)   Resp 18   Ht 5\' 6"  (1.676 m)   Wt (!) 356 lb 12.8 oz (161.8 kg)   SpO2 96%   BMI 57.59 kg/m  BP Readings from Last 3 Encounters:  04/29/22 120/68  04/13/22 132/76  03/02/22 122/68   Wt Readings from Last 3 Encounters:  04/29/22 (!) 356 lb 12.8 oz (161.8 kg)  04/13/22 240 lb 12.8 oz (109.2 kg)  03/02/22 237 lb 6.4 oz (107.7 kg)      Physical Exam Constitutional:      Appearance: Normal appearance.  HENT:     Head: Normocephalic and atraumatic.  Eyes:     Conjunctiva/sclera: Conjunctivae normal.  Cardiovascular:     Rate and Rhythm: Normal rate and regular rhythm.  Pulmonary:      Effort: Pulmonary effort is normal.     Breath sounds: Normal breath sounds.  Abdominal:     General: Bowel sounds are normal. There is no distension.     Palpations: Abdomen is soft.     Tenderness: There is abdominal tenderness. There is no guarding or rebound.     Comments: Epigastric pain to palpation. Rectal exam with several external hemorrhoids with positive fecal occult  Skin:    General: Skin is warm and dry.     Coloration: Skin is pale.  Neurological:     General: No focal deficit present.     Mental Status: She is alert. Mental status is at baseline.  Psychiatric:        Mood and Affect: Mood normal.        Behavior: Behavior normal.     No results found for any visits on 04/29/22.      Assessment & Plan:   1. Bright red rectal bleeding: Bright red blood per rectum x 5 days with positive fecal occult in the office today and epigastric pain on abdominal exam. Patient not on blood thinners but pale and weak. Appropriate for ER examination for possible Gi bleed. Discussed this with the patient and she agrees. She is her with her husband and feels  safe to ride over to the hospital with her husband.   - POCT occult blood stool   Return if symptoms worsen or fail to improve.  Margarita Mail, DO

## 2022-04-29 NOTE — Discharge Instructions (Addendum)
You have a colonoscopy scheduled Dr. Allegra Lai next week on 4/17.  Please call the office to confirm the time of the procedure.  You may continue to have some blood in the toilet and with wiping.  If you start to notice the blood is mixed in with the stool or is increasing in volume then please return to the emergency department.

## 2022-04-29 NOTE — ED Triage Notes (Signed)
Pt to ED with husband for 6 days of bright red rectal bleeding. Pt is ambulatory with steady gait, skin dry. Denies dizziness, endorses generalized weakness. Eating and drinking normally. LBM yesterday. Denies pain. VSS. No blood thinners.

## 2022-04-29 NOTE — Addendum Note (Signed)
Addended by: Davene Costain on: 04/29/2022 11:45 AM   Modules accepted: Orders

## 2022-04-30 ENCOUNTER — Ambulatory Visit: Payer: Medicare HMO | Admitting: Nurse Practitioner

## 2022-04-30 ENCOUNTER — Ambulatory Visit: Payer: Medicare HMO | Admitting: Internal Medicine

## 2022-05-03 ENCOUNTER — Telehealth: Payer: Self-pay

## 2022-05-03 MED ORDER — NA SULFATE-K SULFATE-MG SULF 17.5-3.13-1.6 GM/177ML PO SOLN: 354.0000 mL | Freq: Once | ORAL | 0 refills | Status: AC

## 2022-05-03 NOTE — Telephone Encounter (Signed)
Patient is calling because she states her pharmacy does not have her prep at the pharmacy. Informed patient this was called in to [employer] o 03/03/2022. She states well they have not called me about the medication. Informed patient I would re send it to the pharmacy and she then asked when it would be ready for pick up. Informed her she would need to call the pharmacy to find out that information because I am not at the pharmacy. She then asked what time she needed to be at the colonoscopy on 05/05/2022 informed patient she needed to call on 05/04/2022 between 1 and 3 the number on her paperwork to find out the time.

## 2022-05-04 ENCOUNTER — Telehealth: Payer: Self-pay | Admitting: *Deleted

## 2022-05-04 ENCOUNTER — Ambulatory Visit: Payer: Self-pay

## 2022-05-04 NOTE — Chronic Care Management (AMB) (Signed)
   05/04/2022  CEYDA PETERKA 1945/10/26 [id number]   Reason for Encounter: Patient is not currently enrolled in the CCM program. CCM status changed to previously enrolled  Alto Denver RN, MSN, CCM RN Care Manager  Chronic Care Management Direct Number: [phone]

## 2022-05-04 NOTE — Transitions of Care (Post Inpatient/ED Visit) (Signed)
   05/04/2022  Name: Tamara [name] MRN: [id number] DOB: [birth year]  Today's TOC FU Call Status: Today's TOC FU Call Status:: Successful TOC FU Call Competed TOC FU Call Complete Date: 05/04/22  Transition Care Management Follow-up Telephone Call Date of Discharge: 10/31/22 Discharge Facility: [facility]) Type of Discharge: Emergency Department Reason for ED Visit: Other: (rectal bleeding) How have you been since you were released from the hospital?: Better Any questions or concerns?: No  Items Reviewed: Did you receive and understand the discharge instructions provided?: Yes Medications obtained and verified?: Yes (Medications Reviewed) Any new allergies since your discharge?: No Dietary orders reviewed?: No Do you have support at home?: Yes People in Home: spouse Name of Support/Comfort Primary Source: [facility] and Equipment/Supplies: Were Home Health Services Ordered?: NA Any new equipment or medical supplies ordered?: NA  Functional Questionnaire: Do you need assistance with bathing/showering or dressing?: No Do you need assistance with meal preparation?: No Do you need assistance with eating?: No Do you have difficulty maintaining continence: No Do you need assistance with getting out of bed/getting out of a chair/moving?: No Do you have difficulty managing or taking your medications?: No  Follow up appointments reviewed: PCP Follow-up appointment confirmed?: NA Specialist Hospital Follow-up appointment confirmed?: Yes Date of Specialist follow-up appointment?: 05/05/22 Follow-Up Specialty Provider:: Dr Allegra Lai Do you need transportation to your follow-up appointment?: No Do you understand care options if your condition(s) worsen?: Yes-patient verbalized understanding  SDOH Interventions Today    Flowsheet Row Most Recent Value  SDOH Interventions   Food Insecurity Interventions Intervention Not Indicated  Housing  Interventions Intervention Not Indicated  Transportation Interventions Intervention Not Indicated      Interventions Today    Flowsheet Row Most Recent Value  General Interventions   General Interventions Discussed/Reviewed General Interventions Discussed, General Interventions Reviewed, Doctor Visits  Doctor Visits Discussed/Reviewed Doctor Visits Discussed, Doctor Visits Reviewed, Specialist  PCP/Specialist Visits Compliance with follow-up visit      TOC Interventions Today    Flowsheet Row Most Recent Value  TOC Interventions   TOC Interventions Discussed/Reviewed TOC Interventions Discussed, TOC Interventions Reviewed  Tamara [name] is having a colonoscopy [id number] to follow up with rectal bleeding]        Tamara [name] BSN RN [facility] [phone]

## 2022-05-05 ENCOUNTER — Encounter: Payer: Self-pay | Admitting: Gastroenterology

## 2022-05-05 ENCOUNTER — Telehealth: Payer: Self-pay | Admitting: Cardiology

## 2022-05-05 ENCOUNTER — Encounter
Admission: RE | Disposition: A | Discharge status: Home / Self Care | Payer: Self-pay | Source: Home / Self Care | Attending: Gastroenterology

## 2022-05-05 ENCOUNTER — Ambulatory Visit
Admission: RE | Admit: 2022-05-05 | Discharge: 2022-05-05 | Disposition: A | Discharge status: Home / Self Care | Payer: Medicare HMO | Attending: Gastroenterology | Admitting: Gastroenterology

## 2022-05-05 ENCOUNTER — Ambulatory Visit: Payer: Medicare HMO | Admitting: Anesthesiology

## 2022-05-05 DIAGNOSIS — D122 Benign neoplasm of ascending colon: Secondary | ICD-10-CM | POA: Diagnosis not present

## 2022-05-05 DIAGNOSIS — I10 Essential (primary) hypertension: Secondary | ICD-10-CM | POA: Insufficient documentation

## 2022-05-05 DIAGNOSIS — Z794 Long term (current) use of insulin: Secondary | ICD-10-CM | POA: Insufficient documentation

## 2022-05-05 DIAGNOSIS — Z09 Encounter for follow-up examination after completed treatment for conditions other than malignant neoplasm: Secondary | ICD-10-CM | POA: Insufficient documentation

## 2022-05-05 DIAGNOSIS — J449 Chronic obstructive pulmonary disease, unspecified: Secondary | ICD-10-CM | POA: Insufficient documentation

## 2022-05-05 DIAGNOSIS — K649 Unspecified hemorrhoids: Secondary | ICD-10-CM | POA: Diagnosis not present

## 2022-05-05 DIAGNOSIS — K219 Gastro-esophageal reflux disease without esophagitis: Secondary | ICD-10-CM | POA: Diagnosis not present

## 2022-05-05 DIAGNOSIS — K625 Hemorrhage of anus and rectum: Secondary | ICD-10-CM | POA: Diagnosis not present

## 2022-05-05 DIAGNOSIS — D120 Benign neoplasm of cecum: Secondary | ICD-10-CM | POA: Diagnosis not present

## 2022-05-05 DIAGNOSIS — Z79899 Other long term (current) drug therapy: Secondary | ICD-10-CM | POA: Diagnosis not present

## 2022-05-05 DIAGNOSIS — Z1211 Encounter for screening for malignant neoplasm of colon: Secondary | ICD-10-CM

## 2022-05-05 DIAGNOSIS — M1990 Unspecified osteoarthritis, unspecified site: Secondary | ICD-10-CM | POA: Insufficient documentation

## 2022-05-05 DIAGNOSIS — Z87891 Personal history of nicotine dependence: Secondary | ICD-10-CM | POA: Diagnosis not present

## 2022-05-05 DIAGNOSIS — K644 Residual hemorrhoidal skin tags: Secondary | ICD-10-CM | POA: Insufficient documentation

## 2022-05-05 DIAGNOSIS — Z860100 Personal history of colon polyps, unspecified: Secondary | ICD-10-CM

## 2022-05-05 DIAGNOSIS — Z6837 Body mass index (BMI) 37.0-37.9, adult: Secondary | ICD-10-CM | POA: Insufficient documentation

## 2022-05-05 DIAGNOSIS — K5730 Diverticulosis of large intestine without perforation or abscess without bleeding: Secondary | ICD-10-CM

## 2022-05-05 DIAGNOSIS — K648 Other hemorrhoids: Secondary | ICD-10-CM | POA: Insufficient documentation

## 2022-05-05 DIAGNOSIS — K635 Polyp of colon: Secondary | ICD-10-CM

## 2022-05-05 DIAGNOSIS — Q2733 Arteriovenous malformation of digestive system vessel: Secondary | ICD-10-CM | POA: Diagnosis not present

## 2022-05-05 DIAGNOSIS — Z7984 Long term (current) use of oral hypoglycemic drugs: Secondary | ICD-10-CM | POA: Diagnosis not present

## 2022-05-05 DIAGNOSIS — D124 Benign neoplasm of descending colon: Secondary | ICD-10-CM | POA: Insufficient documentation

## 2022-05-05 DIAGNOSIS — D126 Benign neoplasm of colon, unspecified: Secondary | ICD-10-CM | POA: Diagnosis not present

## 2022-05-05 DIAGNOSIS — E119 Type 2 diabetes mellitus without complications: Secondary | ICD-10-CM | POA: Diagnosis not present

## 2022-05-05 DIAGNOSIS — I2510 Atherosclerotic heart disease of native coronary artery without angina pectoris: Secondary | ICD-10-CM | POA: Diagnosis not present

## 2022-05-05 DIAGNOSIS — E785 Hyperlipidemia, unspecified: Secondary | ICD-10-CM | POA: Insufficient documentation

## 2022-05-05 DIAGNOSIS — E039 Hypothyroidism, unspecified: Secondary | ICD-10-CM | POA: Diagnosis not present

## 2022-05-05 HISTORY — DX: Chronic obstructive pulmonary disease, unspecified: J44.9

## 2022-05-05 HISTORY — PX: COLONOSCOPY WITH PROPOFOL: SHX5780

## 2022-05-05 LAB — GLUCOSE, CAPILLARY: Glucose-Capillary: 159 mg/dL — ABNORMAL HIGH (ref 70–99)

## 2022-05-05 SURGERY — COLONOSCOPY WITH PROPOFOL
Anesthesia: General

## 2022-05-05 MED ORDER — LIDOCAINE HCL (PF) 2 % IJ SOLN
INTRAMUSCULAR | Status: AC
  Filled 2022-05-05: qty 5

## 2022-05-05 MED ORDER — PROPOFOL 500 MG/50ML IV EMUL
INTRAVENOUS | Status: DC | PRN
  Administered 2022-05-05: 75 ug/kg/min via INTRAVENOUS

## 2022-05-05 MED ORDER — SODIUM CHLORIDE 0.9 % IV SOLN: INTRAVENOUS | Status: DC

## 2022-05-05 MED ORDER — PHENYLEPHRINE HCL (PRESSORS) 10 MG/ML IV SOLN
INTRAVENOUS | Status: DC | PRN
  Administered 2022-05-05: 120 ug via INTRAVENOUS

## 2022-05-05 MED ORDER — PROPOFOL 10 MG/ML IV BOLUS
INTRAVENOUS | Status: DC | PRN
  Administered 2022-05-05: 10 mg via INTRAVENOUS
  Administered 2022-05-05: 70 mg via INTRAVENOUS

## 2022-05-05 MED ORDER — PHENYLEPHRINE 80 MCG/ML (10ML) SYRINGE FOR IV PUSH (FOR BLOOD PRESSURE SUPPORT)
PREFILLED_SYRINGE | INTRAVENOUS | Status: AC
  Filled 2022-05-05: qty 10

## 2022-05-05 MED ORDER — LIDOCAINE HCL (CARDIAC) PF 100 MG/5ML IV SOSY
PREFILLED_SYRINGE | INTRAVENOUS | Status: DC | PRN
  Administered 2022-05-05: 50 mg via INTRAVENOUS

## 2022-05-05 MED ORDER — PROPOFOL 10 MG/ML IV BOLUS
INTRAVENOUS | Status: AC
  Filled 2022-05-05: qty 40

## 2022-05-05 NOTE — Anesthesia Preprocedure Evaluation (Signed)
Anesthesia Evaluation  Patient identified by MRN, date of birth, ID band Patient awake    Reviewed: Allergy & Precautions, NPO status , Patient's Chart, lab work & pertinent test results  Airway Mallampati: II  TM Distance: >3 FB Neck ROM: full    Dental  (+) Upper Dentures, Lower Dentures   Pulmonary neg pulmonary ROS, shortness of breath and with exertion, COPD,  COPD inhaler, former smoker   Pulmonary exam normal  + decreased breath sounds      Cardiovascular Exercise Tolerance: Poor hypertension, Pt. on medications + CAD  negative cardio ROS Normal cardiovascular exam Rhythm:Regular Rate:Normal     Neuro/Psych   Anxiety     negative neurological ROS  negative psych ROS   GI/Hepatic negative GI ROS, Neg liver ROS,GERD  Medicated,,  Endo/Other  negative endocrine ROSdiabetes, Well Controlled, Type 2, Oral Hypoglycemic Agents, Insulin DependentHypothyroidism  Morbid obesity  Renal/GU negative Renal ROS  negative genitourinary   Musculoskeletal   Abdominal  (+) + obese  Peds negative pediatric ROS (+)  Hematology negative hematology ROS (+)   Anesthesia Other Findings Past Medical History: No date: Arthritis     Comment:  knee and shoulders 09/05/2017: Colon polyps No date: COPD (chronic obstructive pulmonary disease) No date: Diabetes mellitus without complication     Comment:  type II No date: Dyspnea     Comment:  05/16/20 has had a cough for 2.5 years post Covid 09/05/2017: Gastritis No date: GERD (gastroesophageal reflux disease) No date: Hyperlipidemia No date: Hypertension     Comment:  patient denies No date: Hypothyroidism No date: Panic attack 03/09/2017: RUQ pain     Comment:  Per patient, she has had RUQ pain 4-5 years that has               grown worse in the last few months.  Past Surgical History: 2008: BREAST BIOPSY; Left     Comment:  CORE W/CLIP - NEG 05/19/2020: BRONCHIAL BIOPSY      Comment:  Procedure: BRONCHIAL BIOPSIES;  Surgeon: Leslye Peer, MD;  Location: [facility] ENDOSCOPY;  Service: Pulmonary;; 05/19/2020: BRONCHIAL BRUSHINGS     Comment:  Procedure: BRONCHIAL BRUSHINGS;  Surgeon: Leslye Peer, MD;  Location: [facility] ENDOSCOPY;  Service: Pulmonary;; 05/19/2020: BRONCHIAL NEEDLE ASPIRATION BIOPSY     Comment:  Procedure: BRONCHIAL NEEDLE ASPIRATION BIOPSIES;                Surgeon: Leslye Peer, MD;  Location: [facility] ENDOSCOPY;                Service: Pulmonary;; 05/19/2020: BRONCHIAL WASHINGS     Comment:  Procedure: BRONCHIAL WASHINGS;  Surgeon: Leslye Peer, MD;  Location: [facility] ENDOSCOPY;  Service: Pulmonary;; 02/08/2017: COLONOSCOPY WITH PROPOFOL; N/A     Comment:  Procedure: COLONOSCOPY WITH PROPOFOL;  Surgeon:               Christena Deem, MD;  Location: [facility] ENDOSCOPY;                Service: Endoscopy;  Laterality: N/A; 02/08/2017: ESOPHAGOGASTRODUODENOSCOPY (EGD) WITH PROPOFOL; N/A     Comment:  Procedure: ESOPHAGOGASTRODUODENOSCOPY (EGD) WITH               PROPOFOL;  Surgeon: Christena Deem, MD;  Location:               [facility] ENDOSCOPY;  Service: Endoscopy;  Laterality: N/A; No date: TUBAL LIGATION 05/19/2020: VIDEO BRONCHOSCOPY WITH ENDOBRONCHIAL NAVIGATION; N/A     Comment:  Procedure: VIDEO BRONCHOSCOPY WITH ENDOBRONCHIAL               NAVIGATION;  Surgeon: Leslye Peer, MD;  Location: [facility]               ENDOSCOPY;  Service: Pulmonary;  Laterality: N/A;  BMI    Body Mass Index: 37.77 kg/m      Reproductive/Obstetrics negative OB ROS                             Anesthesia Physical Anesthesia Plan  ASA: 3  Anesthesia Plan: General   Post-op Pain Management:    Induction: Intravenous  PONV Risk Score and Plan: Propofol infusion and TIVA  Airway Management Planned: Natural Airway  Additional Equipment:   Intra-op Plan:   Post-operative Plan:   Informed Consent: I  have reviewed the patients History and Physical, chart, labs and discussed the procedure including the risks, benefits and alternatives for the proposed anesthesia with the patient or authorized representative who has indicated his/her understanding and acceptance.     Dental Advisory Given  Plan Discussed with: CRNA and Surgeon  Anesthesia Plan Comments:        Anesthesia Quick Evaluation

## 2022-05-05 NOTE — H&P (Signed)
Arlyss Repress, MD [address]  [address]  [place], [place] [address]  Main: [phone]  Fax: [phone] Pager: [phone]  Primary Care Physician:  Berniece Salines, FNP Primary Gastroenterologist:  Dr. Arlyss Repress  Pre-Procedure History & Physical: HPI:  Tamara [name] is a 77 y.o. female is here for an colonoscopy.   Past Medical History:  Diagnosis Date   Arthritis    knee and shoulders   Colon polyps 09/05/2017   Diabetes mellitus without complication    type II   Dyspnea    05/16/20 has had a cough for 2.5 years post Covid   Gastritis 09/05/2017   GERD (gastroesophageal reflux disease)    Hyperlipidemia    Hypertension    patient denies   Hypothyroidism    Panic attack    RUQ pain 03/09/2017   Per patient, she has had RUQ pain 4-5 years that has grown worse in the last few months.    Past Surgical History:  Procedure Laterality Date   BREAST BIOPSY Left 2008   CORE W/CLIP - NEG   BRONCHIAL BIOPSY  05/19/2020   Procedure: BRONCHIAL BIOPSIES;  Surgeon: Leslye Peer, MD;  Location: [facility] ENDOSCOPY;  Service: Pulmonary;;   BRONCHIAL BRUSHINGS  05/19/2020   Procedure: BRONCHIAL BRUSHINGS;  Surgeon: Leslye Peer, MD;  Location:  [facility] ENDOSCOPY;  Service: Pulmonary;;   BRONCHIAL NEEDLE ASPIRATION BIOPSY  05/19/2020   Procedure: BRONCHIAL NEEDLE ASPIRATION BIOPSIES;  Surgeon: Leslye Peer, MD;  Location: [facility] ENDOSCOPY;  Service: Pulmonary;;   BRONCHIAL WASHINGS  05/19/2020   Procedure: BRONCHIAL WASHINGS;  Surgeon: Leslye Peer, MD;  Location: [facility] ENDOSCOPY;  Service: Pulmonary;;   COLONOSCOPY WITH PROPOFOL N/A 02/08/2017   Procedure: COLONOSCOPY WITH PROPOFOL;  Surgeon: Christena Deem, MD;  Location: [facility] ENDOSCOPY;  Service: Endoscopy;  Laterality: N/A;   ESOPHAGOGASTRODUODENOSCOPY (EGD) WITH PROPOFOL N/A 02/08/2017   Procedure: ESOPHAGOGASTRODUODENOSCOPY (EGD) WITH PROPOFOL;  Surgeon: Christena Deem, MD;  Location: [facility] ENDOSCOPY;  Service:  Endoscopy;  Laterality: N/A;   TUBAL LIGATION     VIDEO BRONCHOSCOPY WITH ENDOBRONCHIAL NAVIGATION N/A 05/19/2020   Procedure: VIDEO BRONCHOSCOPY WITH ENDOBRONCHIAL NAVIGATION;  Surgeon: Leslye Peer, MD;  Location: [facility] ENDOSCOPY;  Service: Pulmonary;  Laterality: N/A;    Prior to Admission medications   Medication Sig Start Date End Date Taking? Authorizing Provider  Ascorbic Acid (VITAMIN C) 1000 MG tablet Take 1,000 mg by mouth daily.   Yes [provider]  Budeson-Glycopyrrol-Formoterol (BREZTRI AEROSPHERE) 160-9-4.8 MCG/ACT AERO Inhale 2 puffs into the lungs in the morning and at bedtime. 04/13/22  Yes Leslye Peer, MD  Cholecalciferol (VITAMIN D) 50 MCG (2000 UT) CAPS Take 1,000 Units by mouth daily.   Yes [provider]  dapagliflozin propanediol (FARXIGA) 5 MG TABS tablet Take 1 tablet (5 mg total) by mouth daily before breakfast. 11/27/21  Yes Carlus Pavlov, MD  ezetimibe (ZETIA) 10 MG tablet Take 1 tablet (10 mg total) by mouth daily. 02/16/22  Yes Flossie Dibble, NP  furosemide (LASIX) 40 MG tablet Take 1 tablet (40 mg total) by mouth daily. 08/26/21  Yes Margarita Mail, DO  gabapentin (NEURONTIN) 300 MG capsule Take by mouth. 10/16/21  Yes [provider]  hydrochlorothiazide (HYDRODIURIL) 25 MG tablet TAKE 1 TABLET EVERY DAY 03/18/22  Yes Berniece Salines, FNP  levothyroxine (SYNTHROID) 50 MCG tablet Take 1 tablet (50 mcg total) by mouth daily before breakfast. 03/01/22  Yes Margarita Mail, DO  metFORMIN (GLUCOPHAGE) 1000  MG tablet Take 1 tablet (1,000 mg total) by mouth 2 (two) times daily. 08/11/21  Yes Carlus Pavlov, MD  methocarbamol (ROBAXIN) 500 MG tablet TAKE 1 TABLET EVERY 8 HOURS AS NEEDED FOR MUSCLE SPASMS. 07/22/21  Yes Danelle Berry, PA-C  pantoprazole (PROTONIX) 40 MG tablet TAKE 1 TABLET TWICE DAILY 12/14/21  Yes Byrum, Les Pou, MD  potassium chloride SA (KLOR-CON M) 20 MEQ tablet Take 1 tablet (20 mEq total) by mouth daily. You  may take an extra tablet (20 mEq total) by mouth as needed when you administer your furosemide (lasix). 04/15/22  Yes Meriam Sprague, MD  valsartan (DIOVAN) 80 MG tablet Take 1 tablet (80 mg total) by mouth daily. 07/22/21  Yes Danelle Berry, PA-C  Accu-Chek Softclix Lancets lancets TEST BLOOD SUGAR TWICE DAILY 07/01/21   Danelle Berry, PA-C  albuterol (PROVENTIL) (2.5 MG/3ML) 0.083% nebulizer solution Take 3 mLs (2.5 mg total) by nebulization every 6 (six) hours as needed for wheezing or shortness of breath. 03/01/22 03/01/23  Margarita Mail, DO  albuterol (VENTOLIN HFA) 108 (90 Base) MCG/ACT inhaler Inhale 2 puffs into the lungs every 6 (six) hours as needed for wheezing or shortness of breath. 09/17/20   Leslye Peer, MD  Alcohol Swabs (B-D SINGLE USE SWABS REGULAR) PADS USE  TO TEST BLOOD SUGAR TWICE DAILY 08/11/21   Carlus Pavlov, MD  DROPLET PEN NEEDLES 32G X 4 MM MISC 1 Device by Other route daily. 08/11/21   Carlus Pavlov, MD  Insulin NPH, Human,, Isophane, (NOVOLIN N FLEXPEN) 100 UNIT/ML Kiwkpen Inject under skin 36 units each morning, and 28-30 units each evening 11/16/21   Carlus Pavlov, MD  meloxicam (MOBIC) 15 MG tablet TAKE 1/2 TO 1 TABLET EVERY DAY AS NEEDED FOR PAIN 04/14/22   Alba Cory, MD  nystatin (MYCOSTATIN/NYSTOP) powder Apply 1 Application topically daily as needed (yeast under belly). 03/01/22   Margarita Mail, DO  rosuvastatin (CRESTOR) 20 MG tablet Take 1 tablet (20 mg total) by mouth at bedtime. 03/01/22   Margarita Mail, DO  TRUE METRIX BLOOD GLUCOSE TEST test strip Use 2x a day 08/11/21   Carlus Pavlov, MD    Allergies as of 03/03/2022 - Review Complete 03/02/2022  Allergen Reaction Noted   Penicillins Hives 07/14/2016    Family History  Problem Relation Age of Onset   Diabetes Maternal Grandmother    Breast cancer Neg Hx     Social History   Socioeconomic History   Marital status: Married    Spouse name: Adell Koval   Number  of children: 3   Years of education: Not on file   Highest education level: High school graduate  Occupational History   Occupation: retired  Tobacco Use   Smoking status: Former    Packs/day: 1.00    Years: 40.00    Additional pack years: 0.00    Total pack years: 40.00    Types: Cigarettes    Quit date: 2006    Years since quitting: 18.3   Smokeless tobacco: Never   Tobacco comments:    Smoking cessation materials not required  Vaping Use   Vaping Use: Never used  Substance and Sexual Activity   Alcohol use: No   Drug use: No   Sexual activity: Not on file  Other Topics Concern   Not on file  Social History Narrative   Not on file   Social Determinants of Health   Financial Resource Strain: Medium Risk (10/20/2021)   Overall Financial Resource Strain (CARDIA)  Difficulty of Paying Living Expenses: Somewhat hard  Food Insecurity: No Food Insecurity (05/04/2022)   Hunger Vital Sign    Worried About Running Out of Food in the Last Year: Never true    Ran Out of Food in the Last Year: Never true  Transportation Needs: No Transportation Needs (05/04/2022)   PRAPARE - Administrator, Civil Service (Medical): No    Lack of Transportation (Non-Medical): No  Physical Activity: Inactive (10/20/2021)   Exercise Vital Sign    Days of Exercise per Week: 0 days    Minutes of Exercise per Session: 0 min  Stress: Stress Concern Present (10/20/2021)   [employer] of Occupational Health - Occupational Stress Questionnaire    Feeling of Stress : To some extent  Social Connections: Moderately Integrated (10/20/2021)   Social Connection and Isolation Panel [NHANES]    Frequency of Communication with Friends and Family: More than three times a week    Frequency of Social Gatherings with Friends and Family: More than three times a week    Attends Religious Services: 1 to 4 times per year    Active Member of [employer] or Organizations: No    Attends Banker  Meetings: Never    Marital Status: Married  Catering manager Violence: Not At Risk (10/20/2021)   Humiliation, Afraid, Rape, and Kick questionnaire    Fear of Current or Ex-Partner: No    Emotionally Abused: No    Physically Abused: No    Sexually Abused: No    Review of Systems: See HPI, otherwise negative ROS  Physical Exam: BP (!) 133/59   Pulse 88   Temp (!) 97.5 F (36.4 C) (Temporal)   Resp 17   Ht  (1.676 m)   Wt 106.1 kg   SpO2 98%   BMI 37.77 kg/m  General:   Alert,  pleasant and cooperative in NAD Head:  Normocephalic and atraumatic. Neck:  Supple; no masses or thyromegaly. Lungs:  Clear throughout to auscultation.    Heart:  Regular rate and rhythm. Abdomen:  Soft, nontender and nondistended. Normal bowel sounds, without guarding, and without rebound.   Neurologic:  Alert and  oriented x4;  grossly normal neurologically.  Impression/Plan: Tamara [name] is here for an colonoscopy to be performed for h/o colon adenomas  Risks, benefits, limitations, and alternatives regarding  colonoscopy have been reviewed with the patient.  Questions have been answered.  All parties agreeable.   Lannette Donath, MD  05/05/2022, 10:11 AM

## 2022-05-05 NOTE — Transfer of Care (Signed)
Immediate Anesthesia Transfer of Care Note  Patient: Tamara [name]  Procedure(s) Performed: COLONOSCOPY WITH PROPOFOL  Patient Location: PACU and Endoscopy Unit  Anesthesia Type:General  Level of Consciousness: awake, alert , and oriented  Airway & Oxygen Therapy: Patient Spontanous Breathing  Post-op Assessment: Report given to RN and Post -op Vital signs reviewed and stable  Post vital signs: Reviewed and stable  Last Vitals:  Vitals Value Taken Time  BP 108/45 05/05/22 1126  Temp    Pulse 85 05/05/22 1126  Resp    SpO2 99 % 05/05/22 1126  Vitals shown include unvalidated device data.  Last Pain:  Vitals:   05/05/22 0946  TempSrc: Temporal  PainSc: 0-No pain         Complications: No notable events documented.

## 2022-05-05 NOTE — Op Note (Signed)
[facility] Gastroenterology Patient Name: Tamara [name] Procedure Date: 05/05/2022 10:43 AM MRN: [id number] Account #: [id number] Date of Birth: [birth year] Admit Type: Outpatient Age: 77 Room: [facility] ENDO ROOM 4 Gender: Female Note Status: Finalized Instrument Name: Prentice Docker [id number] Procedure:             Colonoscopy Indications:           High risk colon cancer surveillance: Personal history                         of colonic polyps, Surveillance: Personal history of                         adenomatous polyps on last colonoscopy 5 years ago,                         Last colonoscopy: January 2019 Providers:             Toney Reil MD, MD Referring MD:          Rudolpho Sevin. Zane Herald (Referring MD) Medicines:             General Anesthesia Complications:         No immediate complications. Estimated blood loss: None. Procedure:             Pre-Anesthesia Assessment:                        - Prior to the procedure, a History and Physical was                         performed, and patient medications and allergies were                         reviewed. The patient is competent. The risks and                         benefits of the procedure and the sedation options and                         risks were discussed with the patient. All questions                         were answered and informed consent was obtained.                         Patient identification and proposed procedure were                         verified by the physician, the nurse, the                         anesthesiologist, the anesthetist and the technician                         in the pre-procedure area in the procedure room in the                         endoscopy suite. Mental Status Examination: alert and  oriented. Airway Examination: normal oropharyngeal                         airway and neck mobility. Respiratory Examination:                         clear  to auscultation. CV Examination: normal.                         Prophylactic Antibiotics: The patient does not require                         prophylactic antibiotics. Prior Anticoagulants: The                         patient has taken no anticoagulant or antiplatelet                         agents. ASA Grade Assessment: III - A patient with                         severe systemic disease. After reviewing the risks and                         benefits, the patient was deemed in satisfactory                         condition to undergo the procedure. The anesthesia                         plan was to use general anesthesia. Immediately prior                         to administration of medications, the patient was                         re-assessed for adequacy to receive sedatives. The                         heart rate, respiratory rate, oxygen saturations,                         blood pressure, adequacy of pulmonary ventilation, and                         response to care were monitored throughout the                         procedure. The physical status of the patient was                         re-assessed after the procedure.                        After obtaining informed consent, the colonoscope was                         passed under direct vision. Throughout the procedure,  the patient's blood pressure, pulse, and oxygen                         saturations were monitored continuously. The                         Colonoscope was introduced through the anus and                         advanced to the the cecum, identified by appendiceal                         orifice and ileocecal valve. The colonoscopy was                         performed without difficulty. The patient tolerated                         the procedure well. The quality of the bowel                         preparation was evaluated using the BBPS [facility] Bowel                          Preparation Scale) with scores of: Right Colon = 3,                         Transverse Colon = 3 and Left Colon = 3 (entire mucosa                         seen well with no residual staining, small fragments                         of stool or opaque liquid). The total BBPS score                         equals 9. The ileocecal valve, appendiceal orifice,                         and rectum were photographed. Findings:      The perianal and digital rectal examinations were normal. Pertinent       negatives include normal sphincter tone and no palpable rectal lesions.      Six sessile polyps were found in the descending colon 3, ascending colon       1 and cecum 2. The polyps were 4 to 5 mm in size. These polyps were       removed with a cold snare. Resection and retrieval were complete.       Estimated blood loss: none.      A diminutive polyp was found in the cecum. The polyp was sessile. The       polyp was removed with a jumbo cold forceps. Resection and retrieval       were complete. Estimated blood loss: none.      Multiple large-mouthed diverticula were found in the recto-sigmoid colon       and sigmoid colon. There was no evidence of diverticular bleeding.      Non-bleeding external and internal hemorrhoids  were found during       retroflexion. The hemorrhoids were large. Impression:            - Six 4 to 5 mm polyps in the descending colon, in the                         ascending colon and in the cecum, removed with a cold                         snare. Resected and retrieved.                        - One diminutive polyp in the cecum, removed with a                         jumbo cold forceps. Resected and retrieved.                        - Severe diverticulosis in the recto-sigmoid colon and                         in the sigmoid colon. There was no evidence of                         diverticular bleeding.                        - Non-bleeding external and internal  hemorrhoids. Recommendation:        - Repeat colonoscopy in 3 - 5 years for surveillance                         of multiple polyps.                        - Discharge patient to home (with escort).                        - Resume previous diet today.                        - Continue present medications.                        - Await pathology results. Procedure Code(s):     --- Professional ---                        [phone], Colonoscopy, flexible; with removal of                         tumor(s), polyp(s), or other lesion(s) by snare                         technique                        45380, 59, Colonoscopy, flexible; with biopsy, single                         or multiple Diagnosis Code(s):     --- Professional ---  D12.4, Benign neoplasm of descending colon                        D12.2, Benign neoplasm of ascending colon                        D12.0, Benign neoplasm of cecum                        K64.8, Other hemorrhoids                        Z86.010, Personal history of colonic polyps                        K57.30, Diverticulosis of large intestine without                         perforation or abscess without bleeding CPT copyright 2022 American Medical Association. All rights reserved. The codes documented in this report are preliminary and upon coder review may  be revised to meet current compliance requirements. Dr. Libby Maw Toney Reil MD, MD 05/05/2022 11:21:49 AM This report has been signed electronically. Number of Addenda: 0 Note Initiated On: 05/05/2022 10:43 AM Scope Withdrawal Time: 0 hours 18 minutes 0 seconds  Total Procedure Duration: 0 hours 22 minutes 12 seconds  Estimated Blood Loss:  Estimated blood loss: none.      [facility]

## 2022-05-05 NOTE — Telephone Encounter (Signed)
   Did you add a medication? No.  ?If yes, how many? N/A  ?If no, reason? Reason for not adding med: Other She is on crestor, aspirin and zetia already with LDL at goal  . A1C had improved from 10.1>7.7 on farxiga and metformin  Did you remove a medication? No.  Did you increase the dosage of any medication? No.  Did you decrease the dosage of any medication? No.  Did you refer to a specialist (i.e. lipid clinic, preventive cardiology, endocrinology)? No. She had already been referred to lipid clinic and has been seen by Margaretmary Dys, Pharm D. No additional medications were needed as she had an excellent response to the addition of zetia.   Has patient seen plaque report? No. Will discuss with her via telephone call.   Laurance Flatten, MD

## 2022-05-05 NOTE — Anesthesia Postprocedure Evaluation (Signed)
Anesthesia Post Note  Patient: Tamara [name]  Procedure(s) Performed: COLONOSCOPY WITH PROPOFOL  Patient location during evaluation: PACU Anesthesia Type: General Level of consciousness: awake and oriented Pain management: pain level controlled Vital Signs Assessment: post-procedure vital signs reviewed and stable Respiratory status: spontaneous breathing and respiratory function stable Cardiovascular status: blood pressure returned to baseline Anesthetic complications: no   No notable events documented.   Last Vitals:  Vitals:   05/05/22 1124 05/05/22 1133  BP: (!) 108/45 (!) 107/57  Pulse: 84 79  Resp:    Temp:    SpO2: 100% 99%    Last Pain:  Vitals:   05/05/22 1133  TempSrc:   PainSc: 0-No pain                 VAN STAVEREN,Nayelly Laughman

## 2022-05-06 ENCOUNTER — Encounter: Payer: Self-pay | Admitting: Gastroenterology

## 2022-05-06 LAB — SURGICAL PATHOLOGY

## 2022-05-06 NOTE — Progress Notes (Unsigned)
Cardiology Office Note:    Date:  05/18/2022   ID:  Tamara [name], DOB December 31, 1945, MRN [id number]  PCP:  Berniece Salines, FNP   [facility] Providers Cardiologist:  Meriam Sprague, MD {   Referring MD: Berniece Salines, FNP    History of Present Illness:    Tamara [name] is a 77 y.o. female with a hx of HTN, aortic atherosclerosis, COPD, DMII, HLD, obesity and CAD who presents to clinic for follow-up.  Patient initially seen in 10/2021 after she was referred by her PCP for triple vessel coronary Ca and LE edema. TTE 11/2021 showed LVEF 65-70%, G1DD, normal RV, no significant valve disease. Coronary CTA with extensive plaque but negative FFR in LAD and Lcx. FFR in distal PDA was 0.79. Ca score 1198. Was recommended for continued aggressive medical therapy.  Today, the patient overall feels well. No chest pain. Continues to have chronic dyspnea on exertion which she attributes to her COPD. This has not progressed. Has chronic LE edema which improves significantly with the lasix but she takes this rarely due to urinary frequency. Stopped ASA due rectal bleeding, but is willing to re-trial given discussion about the type of plaque noted on her CTA which is higher risk. Notably, colonscopy showed hemorrhoids and polyps but no other source of bleeding.   Past Medical History:  Diagnosis Date   Arthritis    knee and shoulders   Colon polyps 09/05/2017   COPD (chronic obstructive pulmonary disease) (HCC)    Diabetes mellitus without complication (HCC)    type II   Dyspnea    05/16/20 has had a cough for 2.5 years post Covid   Gastritis 09/05/2017   GERD (gastroesophageal reflux disease)    Hyperlipidemia    Hypertension    patient denies   Hypothyroidism    Panic attack    RUQ pain 03/09/2017   Per patient, she has had RUQ pain 4-5 years that has grown worse in the last few months.    Past Surgical History:  Procedure Laterality Date   BREAST BIOPSY Left 2008    CORE W/CLIP - NEG   BRONCHIAL BIOPSY  05/19/2020   Procedure: BRONCHIAL BIOPSIES;  Surgeon: Leslye Peer, MD;  Location: [facility] ENDOSCOPY;  Service: Pulmonary;;   BRONCHIAL BRUSHINGS  05/19/2020   Procedure: BRONCHIAL BRUSHINGS;  Surgeon: Leslye Peer, MD;  Location: [facility] ENDOSCOPY;  Service: Pulmonary;;   BRONCHIAL NEEDLE ASPIRATION BIOPSY  05/19/2020   Procedure: BRONCHIAL NEEDLE ASPIRATION BIOPSIES;  Surgeon: Leslye Peer, MD;  Location: [facility] ENDOSCOPY;  Service: Pulmonary;;   BRONCHIAL WASHINGS  05/19/2020   Procedure: BRONCHIAL WASHINGS;  Surgeon: Leslye Peer, MD;  Location: [facility] ENDOSCOPY;  Service: Pulmonary;;   COLONOSCOPY WITH PROPOFOL N/A 02/08/2017   Procedure: COLONOSCOPY WITH PROPOFOL;  Surgeon: Christena Deem, MD;  Location: [facility] ENDOSCOPY;  Service: Endoscopy;  Laterality: N/A;   COLONOSCOPY WITH PROPOFOL N/A 05/05/2022   Procedure: COLONOSCOPY WITH PROPOFOL;  Surgeon: Toney Reil, MD;  Location: [facility] ENDOSCOPY;  Service: Gastroenterology;  Laterality: N/A;   ESOPHAGOGASTRODUODENOSCOPY (EGD) WITH PROPOFOL N/A 02/08/2017   Procedure: ESOPHAGOGASTRODUODENOSCOPY (EGD) WITH PROPOFOL;  Surgeon: Christena Deem, MD;  Location: [facility] ENDOSCOPY;  Service: Endoscopy;  Laterality: N/A;   TUBAL LIGATION     VIDEO BRONCHOSCOPY WITH ENDOBRONCHIAL NAVIGATION N/A 05/19/2020   Procedure: VIDEO BRONCHOSCOPY WITH ENDOBRONCHIAL NAVIGATION;  Surgeon: Leslye Peer, MD;  Location: [facility] ENDOSCOPY;  Service: Pulmonary;  Laterality: N/A;    Current  Medications: Current Meds  Medication Sig   Accu-Chek Softclix Lancets lancets TEST BLOOD SUGAR TWICE DAILY   albuterol (PROVENTIL) (2.5 MG/3ML) 0.083% nebulizer solution Take 3 mLs (2.5 mg total) by nebulization every 6 (six) hours as needed for wheezing or shortness of breath.   albuterol (VENTOLIN HFA) 108 (90 Base) MCG/ACT inhaler Inhale 2 puffs into the lungs every 6 (six) hours as needed for wheezing or shortness of breath.   Alcohol Swabs  (B-D SINGLE USE SWABS REGULAR) PADS USE  TO TEST BLOOD SUGAR TWICE DAILY   Ascorbic Acid (VITAMIN C) 1000 MG tablet Take 1,000 mg by mouth daily.   aspirin EC 81 MG tablet Take 1 tablet (81 mg total) by mouth daily. Swallow whole.   Budeson-Glycopyrrol-Formoterol (BREZTRI AEROSPHERE) 160-9-4.8 MCG/ACT AERO Inhale 2 puffs into the lungs in the morning and at bedtime.   Cholecalciferol (VITAMIN D) 50 MCG (2000 UT) CAPS Take 1,000 Units by mouth daily.   dapagliflozin propanediol (FARXIGA) 5 MG TABS tablet Take 1 tablet (5 mg total) by mouth daily before breakfast.   DROPLET PEN NEEDLES 32G X 4 MM MISC 1 Device by Other route daily.   ezetimibe (ZETIA) 10 MG tablet Take 1 tablet (10 mg total) by mouth daily.   furosemide (LASIX) 40 MG tablet Take 1 tablet (40 mg total) by mouth daily.   gabapentin (NEURONTIN) 300 MG capsule Take by mouth.   hydrochlorothiazide (HYDRODIURIL) 25 MG tablet TAKE 1 TABLET EVERY DAY   Insulin NPH, Human,, Isophane, (NOVOLIN N FLEXPEN) 100 UNIT/ML Kiwkpen Inject under skin 36 units each morning, and 28-30 units each evening   levothyroxine (SYNTHROID) 50 MCG tablet Take 1 tablet (50 mcg total) by mouth daily before breakfast.   meloxicam (MOBIC) 15 MG tablet TAKE 1/2 TO 1 TABLET EVERY DAY AS NEEDED FOR PAIN   metFORMIN (GLUCOPHAGE) 1000 MG tablet Take 1 tablet (1,000 mg total) by mouth 2 (two) times daily.   methocarbamol (ROBAXIN) 500 MG tablet TAKE 1 TABLET EVERY 8 HOURS AS NEEDED FOR MUSCLE SPASMS.   nystatin (MYCOSTATIN/NYSTOP) powder Apply 1 Application topically daily as needed (yeast under belly).   pantoprazole (PROTONIX) 40 MG tablet TAKE 1 TABLET TWICE DAILY   rosuvastatin (CRESTOR) 20 MG tablet Take 1 tablet (20 mg total) by mouth at bedtime.   TRUE METRIX BLOOD GLUCOSE TEST test strip Use 2x a day   valsartan (DIOVAN) 80 MG tablet TAKE 1 TABLET (80 MG TOTAL) BY MOUTH DAILY.   [DISCONTINUED] potassium chloride (KLOR-CON M) 10 MEQ tablet Take 1 tablet (10 mEq  total) by mouth daily.   [DISCONTINUED] potassium chloride SA (KLOR-CON M) 20 MEQ tablet Take 1 tablet (20 mEq total) by mouth daily. You may take an extra tablet (20 mEq total) by mouth as needed when you administer your furosemide (lasix).   [DISCONTINUED] spironolactone (ALDACTONE) 25 MG tablet Take 0.5 tablets (12.5 mg total) by mouth daily.     Allergies:   Penicillins   Social History   Socioeconomic History   Marital status: Married    Spouse name: Ronette Hank   Number of children: 3   Years of education: Not on file   Highest education level: High school graduate  Occupational History   Occupation: retired  Tobacco Use   Smoking status: Former    Packs/day: 1.00    Years: 40.00    Additional pack years: 0.00    Total pack years: 40.00    Types: Cigarettes    Quit date: 2006  Years since quitting: 18.3   Smokeless tobacco: Never   Tobacco comments:    Smoking cessation materials not required  Vaping Use   Vaping Use: Never used  Substance and Sexual Activity   Alcohol use: No   Drug use: No   Sexual activity: Not on file  Other Topics Concern   Not on file  Social History Narrative   Not on file   Social Determinants of Health   Financial Resource Strain: Medium Risk (10/20/2021)   Overall Financial Resource Strain (CARDIA)    Difficulty of Paying Living Expenses: Somewhat hard  Food Insecurity: No Food Insecurity (05/04/2022)   Hunger Vital Sign    Worried About Running Out of Food in the Last Year: Never true    Ran Out of Food in the Last Year: Never true  Transportation Needs: No Transportation Needs (05/04/2022)   PRAPARE - Administrator, Civil Service (Medical): No    Lack of Transportation (Non-Medical): No  Physical Activity: Inactive (10/20/2021)   Exercise Vital Sign    Days of Exercise per Week: 0 days    Minutes of Exercise per Session: 0 min  Stress: Stress Concern Present (10/20/2021)   [employer] of Occupational Health  - Occupational Stress Questionnaire    Feeling of Stress : To some extent  Social Connections: Moderately Integrated (10/20/2021)   Social Connection and Isolation Panel [NHANES]    Frequency of Communication with Friends and Family: More than three times a week    Frequency of Social Gatherings with Friends and Family: More than three times a week    Attends Religious Services: 1 to 4 times per year    Active Member of [employer] or Organizations: No    Attends Banker Meetings: Never    Marital Status: Married     Family History: The patient's family history includes Diabetes in her maternal grandmother. There is no history of Breast cancer.  ROS:   Review of Systems  Constitutional:  Positive for malaise/fatigue. Negative for chills and fever.  HENT:  Negative for nosebleeds and tinnitus.   Eyes:  Negative for blurred vision and pain.  Respiratory:  Positive for cough, shortness of breath and wheezing. Negative for hemoptysis and stridor.   Cardiovascular:  Positive for chest pain and leg swelling (Bilateral). Negative for palpitations, orthopnea, claudication and PND.  Gastrointestinal:  Positive for blood in stool. Negative for nausea and vomiting.  Genitourinary:  Negative for dysuria and hematuria.  Musculoskeletal:  Negative for falls.  Neurological:  Negative for dizziness and loss of consciousness.  Psychiatric/Behavioral:  Negative for depression.      EKGs/Labs/Other Studies Reviewed:    The following studies were reviewed today:  Cardiac Studies & Procedures       ECHOCARDIOGRAM  ECHOCARDIOGRAM COMPLETE 11/27/2021  Narrative ECHOCARDIOGRAM REPORT    Patient Name:   LAELANI VASKO Date of Exam: 11/27/2021 Medical Rec #:  [id number]         Height:       66.0 in Accession #:    [id number]        Weight:       241.8 lb Date of Birth:  Apr 16, 1945        BSA:          2.168 m Patient Age:    76 years          BP:           134/66 mmHg Patient  Gender: F  HR:           103 bpm. Exam Location:  [address]  Procedure: 2D Echo, Cardiac Doppler, Color Doppler, 3D Echo and Strain Analysis  Indications:    Chest Pain R07.9  History:        Patient has no prior history of Echocardiogram examinations. COPD; Risk Factors:Hypertension, Diabetes and Dyslipidemia.  Sonographer:    Thurman Coyer RDCS Referring Phys: [id number] Ac Colan E Lyan Moyano  IMPRESSIONS   1. Mild intracavitary gradient. Peak velocity 0.95 m/s. Mean gradient 3.6 mmHg. Left ventricular ejection fraction, by estimation, is 65 to 70%. The left ventricle has normal function. The left ventricle has no regional wall motion abnormalities. There is mild concentric left ventricular hypertrophy. Left ventricular diastolic parameters are consistent with Grade I diastolic dysfunction (impaired relaxation). The average left ventricular global longitudinal strain is -19.0 %. The global longitudinal strain is normal. 2. Right ventricular systolic function is normal. The right ventricular size is normal. 3. The mitral valve is normal in structure. No evidence of mitral valve regurgitation. No evidence of mitral stenosis. 4. The aortic valve is normal in structure. Aortic valve regurgitation is not visualized. No aortic stenosis is present. 5. The inferior vena cava is normal in size with greater than 50% respiratory variability, suggesting right atrial pressure of 3 mmHg.  FINDINGS Left Ventricle: Mild intracavitary gradient. Peak velocity 0.95 m/s. Mean gradient 3.6 mmHg. Left ventricular ejection fraction, by estimation, is 65 to 70%. The left ventricle has normal function. The left ventricle has no regional wall motion abnormalities. The average left ventricular global longitudinal strain is -19.0 %. The global longitudinal strain is normal. The left ventricular internal cavity size was normal in size. There is mild concentric left ventricular hypertrophy.  Left ventricular diastolic parameters are consistent with Grade I diastolic dysfunction (impaired relaxation). Indeterminate filling pressures.  Right Ventricle: The right ventricular size is normal. No increase in right ventricular wall thickness. Right ventricular systolic function is normal.  Left Atrium: Left atrial size was normal in size.  Right Atrium: Right atrial size was normal in size.  Pericardium: There is no evidence of pericardial effusion.  Mitral Valve: The mitral valve is normal in structure. No evidence of mitral valve regurgitation. No evidence of mitral valve stenosis.  Tricuspid Valve: The tricuspid valve is normal in structure. Tricuspid valve regurgitation is not demonstrated. No evidence of tricuspid stenosis.  Aortic Valve: The aortic valve is normal in structure. Aortic valve regurgitation is not visualized. No aortic stenosis is present.  Pulmonic Valve: The pulmonic valve was normal in structure. Pulmonic valve regurgitation is not visualized. No evidence of pulmonic stenosis.  Aorta: The aortic root is normal in size and structure.  Venous: The inferior vena cava is normal in size with greater than 50% respiratory variability, suggesting right atrial pressure of 3 mmHg.  IAS/Shunts: No atrial level shunt detected by color flow Doppler.   LEFT VENTRICLE PLAX 2D LVIDd:         3.60 cm   Diastology LVIDs:         2.70 cm   LV e' medial:    6.96 cm/s LV PW:         1.30 cm   LV E/e' medial:  12.1 LV IVS:        1.30 cm   LV e' lateral:   6.53 cm/s LVOT diam:     2.00 cm   LV E/e' lateral: 12.9 LV SV:  63 LV SV Index:   29        2D Longitudinal Strain LVOT Area:     3.14 cm  2D Strain GLS (A2C):   -17.6 % 2D Strain GLS (A3C):   -20.0 % 2D Strain GLS (A4C):   -19.4 % 2D Strain GLS Avg:     -19.0 %  3D Volume EF: 3D EF:        61 % LV EDV:       103 ml LV ESV:       41 ml LV SV:        63 ml  RIGHT VENTRICLE RV Basal diam:  2.70 cm RV  Mid diam:    2.80 cm RV S prime:     12.40 cm/s TAPSE (M-mode): 1.7 cm  LEFT ATRIUM             Index        RIGHT ATRIUM           Index LA diam:        3.60 cm 1.66 cm/m   RA Area:     12.00 cm LA Vol (A2C):   37.0 ml 17.07 ml/m  RA Volume:   21.10 ml  9.73 ml/m LA Vol (A4C):   44.0 ml 20.30 ml/m LA Biplane Vol: 40.5 ml 18.68 ml/m AORTIC VALVE LVOT Vmax:   119.00 cm/s LVOT Vmean:  78.000 cm/s LVOT VTI:    0.200 m  AORTA Ao Root diam: 3.60 cm Ao Asc diam:  3.30 cm  MITRAL VALVE MV Area (PHT): 5.42 cm     SHUNTS MV Decel Time: 140 msec     Systemic VTI:  0.20 m MV E velocity: 84.30 cm/s   Systemic Diam: 2.00 cm MV A velocity: 134.00 cm/s MV E/A ratio:  0.63  Chilton Si MD Electronically signed by Chilton Si MD Signature Date/Time: 11/27/2021/4:52:59 PM    Final     CT SCANS  CT CORONARY MORPH W/CTA COR W/SCORE 01/26/2022  Addendum 01/26/2022 10:03 AM ADDENDUM REPORT: 01/26/2022 10:01  EXAM: OVER-READ INTERPRETATION  CT CHEST  The following report is an over-read performed by radiologist Dr. Royal Piedra [facility] on 01/26/2022. This over-read does not include interpretation of cardiac or coronary anatomy or pathology. The coronary calcium score and cardiac CTA interpretation by the cardiologist is attached.  COMPARISON:  None.  FINDINGS: Extensive airspace consolidation with rounded margins in the left lower lobe, but with internal CT angiogram sign, most suggestive of round pneumonia. In the superior aspect of this region there are some cystic areas, similar to prior chest CT and PET-CT. Patchy ground-glass attenuation noted elsewhere in the lungs, particularly in the left upper lobe. No pleural effusions. No definite lymphadenopathy noted in the visualized portions of the mediastinum or hilar regions. Atherosclerotic calcifications in the thoracic aorta. Visualized portions of the upper abdomen demonstrate a shrunken and  nodular contour of the visualized liver indicative of underlying cirrhosis. There are no aggressive appearing lytic or blastic lesions noted in the visualized portions of the skeleton.  IMPRESSION: 1. Persistent findings in the left lower lobe which have imaging characteristics most suggestive of a round pneumonia. However, the persistence of this on several prior examinations dating back to prior chest CT 04/16/2020 is highly unusual and raises concern for obstructing bronchial abnormality or other centrally obstructing lesion. Given the lack of hypermetabolism on prior PET-CT, this may represent a benign lesion, however, strong consideration for further evaluation with bronchoscopy and  tissue sampling is advised if clinically appropriate to exclude slow growing malignancy. 2. Cirrhosis.   Electronically Signed By: Trudie Reed M.D. On: 01/26/2022 10:01  Narrative CLINICAL DATA:  Chest pain  EXAM: Cardiac CTA  MEDICATIONS: Sub lingual nitro. 4mg  x 2  TECHNIQUE: The patient was scanned on a Siemens 192 slice scanner. Gantry rotation speed was 250 msecs. Collimation was 0.6 mm. A 100 kV prospective scan was triggered in the ascending thoracic aorta at 35-75% of the R-R interval. Average HR during the scan was 60 bpm. The 3D data set was interpreted on a dedicated work station using MPR, MIP and VRT modes. A total of 80cc of contrast was used.  FINDINGS: Non-cardiac: See separate report from [facility].  Pulmonary veins drain normally to the left atrium. No LA appendage thrombus.  Prominent mitral annular calcification.  Calcium Score: 1198 Agatston units.  Coronary Arteries: Right dominant with no anomalies  LM: Calcified plaque proximal vessel, mild (1-24%) stenosis.  LAD system: Calcified plaque proximal and mid LAD, mild (25-49%) stenosis.  Circumflex system: Mixed plaque proximal and mid LCx diffusely, suspect mild (25-49%) stenosis.  RCA  system: Diffuse disease in the proximal and mid RCA and in the PDA, suspect mild (25-49%) stenosis.  IMPRESSION: 1. Coronary artery calcium score 1198 Agatston units. This places the patient in the 95th percentile for age and gender.  2. Extensive plaque but nonobstructive disease by FFR in the LAD and LCx systems.  3. FFR 0.79 in the distal PDA. This is of borderline significance. There is no discrete severe stenosis in the RCA system. There is diffuse plaque throughout with resultant borderline FFR decrease in the distal PDA.  [employer] promotion account executive  Electronically Signed: By: Marca Ancona M.D. On: 01/25/2022 19:32           EKG:  No new tracing today  Recent Labs: 08/26/2021: TSH 3.34 10/21/2021: Brain Natriuretic Peptide 16 04/29/2022: ALT 16; BUN 16; Creatinine, Ser 0.98; Hemoglobin 10.6; Platelets 182; Potassium 4.2; Sodium 136   Recent Lipid Panel    Component Value Date/Time   CHOL 140 03/15/2022 1351   TRIG 148 03/15/2022 1351   HDL 50 03/15/2022 1351   CHOLHDL 2.8 03/15/2022 1351   CHOLHDL 4.3 08/26/2021 1010   VLDL 29 08/23/2016 0825   LDLCALC 65 03/15/2022 1351   LDLCALC 112 (H) 08/26/2021 1010   LDLDIRECT 64 03/15/2022 1351     Risk Assessment/Calculations:                Physical Exam:    VS:  BP 134/62   Pulse (!) 103   Ht 5\' 6"  (1.676 m)   Wt 242 lb 3.2 oz (109.9 kg)   SpO2 95%   BMI 39.09 kg/m     Wt Readings from Last 3 Encounters:  05/18/22 242 lb 3.2 oz (109.9 kg)  05/05/22 234 lb (106.1 kg)  04/29/22 236 lb (107 kg)     GEN: Well nourished, well developed in no acute distress HEENT: Normal NECK: No JVD; No carotid bruits CARDIAC: RRR, 2/6 systolic murmur RESPIRATORY:  Crackles in left lung base. No wheezing ABDOMEN: Soft, non-tender, non-distended MUSCULOSKELETAL:  Trace-1+ LE edema bilaterally. Warm SKIN: Warm and dry NEUROLOGIC:  Alert and oriented x 3 PSYCHIATRIC:  Normal affect   ASSESSMENT:    1. Coronary artery disease  involving native coronary artery of native heart without angina pectoris   2. Mixed hyperlipidemia   3. Primary hypertension   4. Medication management  5. Bilateral lower extremity edema   6. Hypothyroidism, unspecified type   7. Type 2 diabetes mellitus with complication, without long-term current use of insulin (HCC)   8. Elevated coronary artery calcium score     PLAN:    In order of problems listed above:  #Multivessel CAD: -Patient with extensive CAD on coronary CTA with negative FFR in LAD and Lcx and borderline positive FFR in distal PDA; Ca score 1198 -Currently with stable dyspnea on exertion but no chest pain -Declined myocardial PET -Will resume ASA 81mg  daily given improvement in hemorrhoidal bleeding; can stop if this recurs -Continue crestor 20mg  daily -Continue zetia 10mg  daily  #Chronic Diastolic HF: -TTE with LVEF 65-70%, G1DD -Continue lasix 40mg  daily -Declined jardiance as she is worried about side-effects -Start sprionolactone 12.5mg  daily -BMET next week -Change potassium to daily  #HTN: Well controlled and at goal. -Continue valsartan 80mg  daily -Continue HCTZ 25mg  daily  #HLD: #Aortic Atherosclerosis: -Continue crestor 20mg  daily -Continue zetia 10mg  daily -Goal LDL <70  #COPD: -Management per PCP and Pulm  #DMII: On insulin. Management per PCP       Medication Adjustments/Labs and Tests Ordered: Current medicines are reviewed at length with the patient today.  Concerns regarding medicines are outlined above.   Orders Placed This Encounter  Procedures   Basic Metabolic Panel (BMET)   Meds ordered this encounter  Medications   aspirin EC 81 MG tablet    Sig: Take 1 tablet (81 mg total) by mouth daily. Swallow whole.    Dispense:  90 tablet    Refill:  3   DISCONTD: spironolactone (ALDACTONE) 25 MG tablet    Sig: Take 0.5 tablets (12.5 mg total) by mouth daily.    Dispense:  45 tablet    Refill:  3   DISCONTD: potassium  chloride (KLOR-CON M) 10 MEQ tablet    Sig: Take 1 tablet (10 mEq total) by mouth daily.    Dispense:  90 tablet    Refill:  2   spironolactone (ALDACTONE) 25 MG tablet    Sig: Take 0.5 tablets (12.5 mg total) by mouth daily.    Dispense:  45 tablet    Refill:  3   potassium chloride (KLOR-CON M) 10 MEQ tablet    Sig: Take 1 tablet (10 mEq total) by mouth daily.    Dispense:  90 tablet    Refill:  2   Patient Instructions  Medication Instructions:   START BACK TAKING ASPIRIN 81 MG BY MOUTH DAILY  START TAKING SPIRONOLACTONE 12.5 MG BY MOUTH DAILY  DECREASE POTASSIUM CHLORIDE TO 10 mEq BY MOUTH DAILY   *If you need a refill on your cardiac medications before your next appointment, please call your pharmacy*   Lab Work:  ONE WEEK HERE IN THE OFFICE--BMET  If you have labs (blood work) drawn today and your tests are completely normal, you will receive your results only by: MyChart Message (if you have MyChart) OR A paper copy in the mail If you have any lab test that is abnormal or we need to change your treatment, we will call you to review the results.    Follow-Up: At [facility], you and your health needs are our priority.  As part of our continuing mission to provide you with exceptional heart care, we have created designated Provider Care Teams.  These Care Teams include your primary Cardiologist (physician) and Advanced Practice Providers (APPs -  Physician Assistants and Nurse Practitioners) who all work  together to provide you with the care you need, when you need it.  We recommend signing up for the patient portal called "MyChart".  Sign up information is provided on this After Visit Summary.  MyChart is used to connect with patients for Virtual Visits (Telemedicine).  Patients are able to view lab/test results, encounter notes, upcoming appointments, etc.  Non-urgent messages can be sent to your provider as well.   To learn more about what you can do with  MyChart, go to [email or web address].    Your next appointment:   6 month(s)  Provider:   Meriam Sprague, MD          Signed, Meriam Sprague, MD  05/18/2022 12:49 PM     [facility]

## 2022-05-07 ENCOUNTER — Encounter: Payer: Self-pay | Admitting: Gastroenterology

## 2022-05-12 ENCOUNTER — Other Ambulatory Visit: Payer: Self-pay | Admitting: Family Medicine

## 2022-05-18 ENCOUNTER — Ambulatory Visit: Payer: Medicare HMO | Attending: Cardiology | Admitting: Cardiology

## 2022-05-18 ENCOUNTER — Encounter: Payer: Self-pay | Admitting: Cardiology

## 2022-05-18 VITALS — BP 134/62 | HR 103 | Ht 66.0 in | Wt 242.2 lb

## 2022-05-18 DIAGNOSIS — Z79899 Other long term (current) drug therapy: Secondary | ICD-10-CM

## 2022-05-18 DIAGNOSIS — I10 Essential (primary) hypertension: Secondary | ICD-10-CM | POA: Diagnosis not present

## 2022-05-18 DIAGNOSIS — E118 Type 2 diabetes mellitus with unspecified complications: Secondary | ICD-10-CM | POA: Diagnosis not present

## 2022-05-18 DIAGNOSIS — E039 Hypothyroidism, unspecified: Secondary | ICD-10-CM | POA: Diagnosis not present

## 2022-05-18 DIAGNOSIS — R600 Localized edema: Secondary | ICD-10-CM | POA: Diagnosis not present

## 2022-05-18 DIAGNOSIS — E782 Mixed hyperlipidemia: Secondary | ICD-10-CM | POA: Diagnosis not present

## 2022-05-18 DIAGNOSIS — I5032 Chronic diastolic (congestive) heart failure: Secondary | ICD-10-CM | POA: Diagnosis not present

## 2022-05-18 DIAGNOSIS — I2510 Atherosclerotic heart disease of native coronary artery without angina pectoris: Secondary | ICD-10-CM

## 2022-05-18 DIAGNOSIS — R931 Abnormal findings on diagnostic imaging of heart and coronary circulation: Secondary | ICD-10-CM | POA: Diagnosis not present

## 2022-05-18 DIAGNOSIS — I110 Hypertensive heart disease with heart failure: Secondary | ICD-10-CM | POA: Diagnosis not present

## 2022-05-18 MED ORDER — POTASSIUM CHLORIDE CRYS ER 10 MEQ PO TBCR: 10.0000 meq | EXTENDED_RELEASE_TABLET | Freq: Every day | ORAL | 2 refills | Status: DC

## 2022-05-18 MED ORDER — SPIRONOLACTONE 25 MG PO TABS: 12.5000 mg | ORAL_TABLET | Freq: Every day | ORAL | 3 refills | Status: DC

## 2022-05-18 MED ORDER — POTASSIUM CHLORIDE CRYS ER 10 MEQ PO TBCR
10.0000 meq | EXTENDED_RELEASE_TABLET | Freq: Every day | ORAL | 2 refills | Status: DC
Start: 2022-05-18 — End: 2022-05-18

## 2022-05-18 MED ORDER — ASPIRIN 81 MG PO TBEC: 81.0000 mg | DELAYED_RELEASE_TABLET | Freq: Every day | ORAL | 3 refills | Status: AC

## 2022-05-18 NOTE — Patient Instructions (Signed)
Medication Instructions:   START BACK TAKING ASPIRIN 81 MG BY MOUTH DAILY  START TAKING SPIRONOLACTONE 12.5 MG BY MOUTH DAILY  DECREASE POTASSIUM CHLORIDE TO 10 mEq BY MOUTH DAILY   *If you need a refill on your cardiac medications before your next appointment, please call your pharmacy*   Lab Work:  ONE WEEK HERE IN THE OFFICE--BMET  If you have labs (blood work) drawn today and your tests are completely normal, you will receive your results only by: MyChart Message (if you have MyChart) OR A paper copy in the mail If you have any lab test that is abnormal or we need to change your treatment, we will call you to review the results.    Follow-Up: At [facility], you and your health needs are our priority.  As part of our continuing mission to provide you with exceptional heart care, we have created designated Provider Care Teams.  These Care Teams include your primary Cardiologist (physician) and Advanced Practice Providers (APPs -  Physician Assistants and Nurse Practitioners) who all work together to provide you with the care you need, when you need it.  We recommend signing up for the patient portal called "MyChart".  Sign up information is provided on this After Visit Summary.  MyChart is used to connect with patients for Virtual Visits (Telemedicine).  Patients are able to view lab/test results, encounter notes, upcoming appointments, etc.  Non-urgent messages can be sent to your provider as well.   To learn more about what you can do with MyChart, go to [email or web address].    Your next appointment:   6 month(s)  Provider:   Meriam Sprague, MD

## 2022-05-26 ENCOUNTER — Ambulatory Visit: Payer: Medicare HMO

## 2022-05-27 ENCOUNTER — Ambulatory Visit: Payer: Medicare HMO | Attending: Cardiology

## 2022-05-27 DIAGNOSIS — E782 Mixed hyperlipidemia: Secondary | ICD-10-CM

## 2022-05-27 DIAGNOSIS — Z79899 Other long term (current) drug therapy: Secondary | ICD-10-CM

## 2022-05-27 DIAGNOSIS — I10 Essential (primary) hypertension: Secondary | ICD-10-CM

## 2022-05-27 LAB — BASIC METABOLIC PANEL
BUN/Creatinine Ratio: 15 (ref 12–28)
BUN: 17 mg/dL (ref 8–27)
CO2: 23 mmol/L (ref 20–29)
Calcium: 10.3 mg/dL (ref 8.7–10.3)
Chloride: 100 mmol/L (ref 96–106)
Creatinine, Ser: 1.15 mg/dL — ABNORMAL HIGH (ref 0.57–1.00)
Glucose: 132 mg/dL — ABNORMAL HIGH (ref 70–99)
Potassium: 4 mmol/L (ref 3.5–5.2)
Sodium: 140 mmol/L (ref 134–144)
eGFR: 50 mL/min/{1.73_m2} — ABNORMAL LOW (ref >59)

## 2022-05-31 ENCOUNTER — Other Ambulatory Visit: Payer: Self-pay | Admitting: Nurse Practitioner

## 2022-05-31 ENCOUNTER — Ambulatory Visit (INDEPENDENT_AMBULATORY_CARE_PROVIDER_SITE_OTHER): Payer: Medicare HMO | Admitting: Family Medicine

## 2022-05-31 ENCOUNTER — Other Ambulatory Visit: Payer: Self-pay | Admitting: Internal Medicine

## 2022-05-31 ENCOUNTER — Encounter: Payer: Self-pay | Admitting: Family Medicine

## 2022-05-31 VITALS — BP 116/66 | HR 98 | Temp 98.0°F | Resp 18 | Ht 66.0 in | Wt 240.4 lb

## 2022-05-31 DIAGNOSIS — I10 Essential (primary) hypertension: Secondary | ICD-10-CM

## 2022-05-31 DIAGNOSIS — Z794 Long term (current) use of insulin: Secondary | ICD-10-CM

## 2022-05-31 DIAGNOSIS — E079 Disorder of thyroid, unspecified: Secondary | ICD-10-CM

## 2022-05-31 DIAGNOSIS — E785 Hyperlipidemia, unspecified: Secondary | ICD-10-CM | POA: Diagnosis not present

## 2022-05-31 DIAGNOSIS — E1165 Type 2 diabetes mellitus with hyperglycemia: Secondary | ICD-10-CM

## 2022-05-31 DIAGNOSIS — K7460 Unspecified cirrhosis of liver: Secondary | ICD-10-CM | POA: Diagnosis not present

## 2022-05-31 DIAGNOSIS — J441 Chronic obstructive pulmonary disease with (acute) exacerbation: Secondary | ICD-10-CM

## 2022-05-31 DIAGNOSIS — D638 Anemia in other chronic diseases classified elsewhere: Secondary | ICD-10-CM | POA: Diagnosis not present

## 2022-05-31 DIAGNOSIS — J984 Other disorders of lung: Secondary | ICD-10-CM | POA: Diagnosis not present

## 2022-05-31 DIAGNOSIS — R600 Localized edema: Secondary | ICD-10-CM

## 2022-05-31 DIAGNOSIS — K625 Hemorrhage of anus and rectum: Secondary | ICD-10-CM | POA: Diagnosis not present

## 2022-05-31 DIAGNOSIS — I700 Atherosclerosis of aorta: Secondary | ICD-10-CM

## 2022-05-31 MED ORDER — FUROSEMIDE 40 MG PO TABS
40.0000 mg | ORAL_TABLET | Freq: Every day | ORAL | 1 refills | Status: DC
Start: 2022-05-31 — End: 2022-09-30

## 2022-05-31 NOTE — Progress Notes (Signed)
Name: Tamara [name]   MRN: [id number]    DOB: June 23, 1945   Date:05/31/2022       Progress Note  Chief Complaint  Patient presents with   Follow-up   Hypertension   Hyperlipidemia   COPD     Subjective:   Tamara [name] is a 77 y.o. female, presents to clinic for routine f/up on chronic conditions  COPD per pulm, better sx control on breztri -she got a sample from pulm - needs to check price, asks about another inhaler sample here - given one Breztri sample Managing allergy triggers with meds when outside She is still following left lower lobe lung mass with pulmonary  Last CT chest and OV pulm reviewed 07/2021 and 08/2021 - f/up CT planned for July 2024 and pulm f/up every 6 months Last exacerbation Dec 2023   LE edema/HTN, BP at goal today on current meds, ankle edema severe, less pitting up shin, no DOE, orthopnea, CP, she notes most SOB is due to COPD On Lasix 40 mg daily, HCTZ 25 daily, potassium 10 mEq daily, spironolactone 12.5 mg daily, valsartan 80 mg daily  BP Readings from Last 3 Encounters:  05/31/22 116/66  05/18/22 134/62  05/05/22 110/77  Cardiac hx, seeing Dr. Shari Prows - meds recently changed spironolactone added, on lasix - renal function and potassium rechecked and normal, pt did not notice any change to LE edema, last ECHO reviewed grade 1 DD, mild LV hypertrophy, LVEF noted below-  triple vessel coronary Ca and LE edema. TTE 11/2021 showed LVEF 65-70%, G1DD, normal RV, no significant valve disease. Coronary CTA with extensive plaque but negative FFR in LAD and Lcx. FFR in distal PDA was 0.79. Ca score 1198. Was recommended for continued aggressive medical therapy.    HLD - Lipids recently checked on zetia and crestor 20 - no med concerns or SE, takes daily CAD high risk - aggressive med management per cardiology, goal of LDL <70 Lab Results  Component Value Date   CHOL 140 03/15/2022   HDL 50 03/15/2022   LDLCALC 65 03/15/2022   LDLDIRECT 64  03/15/2022   TRIG 148 03/15/2022   CHOLHDL 2.8 03/15/2022   T2DM - uncontrolled, on insulin, metformin, farxiga Endocrine managing -IDD with insulin, farxiga- she is overdue for her endo f/up due to having BRBPR and colonoscopy - she needs to schedule f/up  Notices inhalers steroids and po steroids shoot up sugers Lab Results  Component Value Date   HGBA1C 7.7 (A) 11/16/2021    Hypothyroid on 50 mcg daily levothyroxine - last labs about 9 months ago Lab Results  Component Value Date   TSH 3.34 08/26/2021   BRBPR she did f/up with GI and it was determined to be hemorrhoids Hemoglobin  Date Value Ref Range Status  04/29/2022 10.6 (L) 12.0 - 15.0 g/dL Final  84/13/2440 10.2 (L) 12.0 - 15.0 g/dL Final  72/53/6644 03.4 (L) 11.7 - 15.5 g/dL Final  74/25/9563 87.5 (L) 11.7 - 15.5 g/dL Final  Chronic anemia - as the time of sx and bleeding there was no sig drop in H/H Pt does not want to check today, she just got blood done by cardiology less than a week ago             Current Outpatient Medications:    Accu-Chek Softclix Lancets lancets, TEST BLOOD SUGAR TWICE DAILY, Disp: 200 each, Rfl: 11   albuterol (PROVENTIL) (2.5 MG/3ML) 0.083% nebulizer solution, Take 3 mLs (2.5 mg total) by nebulization  every 6 (six) hours as needed for wheezing or shortness of breath., Disp: 75 mL, Rfl: 5   albuterol (VENTOLIN HFA) 108 (90 Base) MCG/ACT inhaler, Inhale 2 puffs into the lungs every 6 (six) hours as needed for wheezing or shortness of breath., Disp: 8 g, Rfl: 6   Alcohol Swabs (B-D SINGLE USE SWABS REGULAR) PADS, USE  TO TEST BLOOD SUGAR TWICE DAILY, Disp: 200 each, Rfl: 3   Ascorbic Acid (VITAMIN C) 1000 MG tablet, Take 1,000 mg by mouth daily., Disp: , Rfl:    aspirin EC 81 MG tablet, Take 1 tablet (81 mg total) by mouth daily. Swallow whole., Disp: 90 tablet, Rfl: 3   Budeson-Glycopyrrol-Formoterol (BREZTRI AEROSPHERE) 160-9-4.8 MCG/ACT AERO, Inhale 2 puffs into the lungs in the  morning and at bedtime., Disp: 5.9 g, Rfl: 0   Cholecalciferol (VITAMIN D) 50 MCG (2000 UT) CAPS, Take 1,000 Units by mouth daily., Disp: , Rfl:    dapagliflozin propanediol (FARXIGA) 5 MG TABS tablet, Take 1 tablet (5 mg total) by mouth daily before breakfast., Disp: 90 tablet, Rfl: 3   DROPLET PEN NEEDLES 32G X 4 MM MISC, 1 Device by Other route daily., Disp: 200 each, Rfl: 3   ezetimibe (ZETIA) 10 MG tablet, Take 1 tablet (10 mg total) by mouth daily., Disp: 90 tablet, Rfl: 3   furosemide (LASIX) 40 MG tablet, Take 1 tablet (40 mg total) by mouth daily., Disp: 30 tablet, Rfl: 1   gabapentin (NEURONTIN) 300 MG capsule, Take by mouth., Disp: , Rfl:    hydrochlorothiazide (HYDRODIURIL) 25 MG tablet, TAKE 1 TABLET EVERY DAY, Disp: 90 tablet, Rfl: 0   Insulin NPH, Human,, Isophane, (NOVOLIN N FLEXPEN) 100 UNIT/ML Kiwkpen, Inject under skin 36 units each morning, and 28-30 units each evening, Disp: 60 mL, Rfl: 3   levothyroxine (SYNTHROID) 50 MCG tablet, Take 1 tablet (50 mcg total) by mouth daily before breakfast., Disp: 90 tablet, Rfl: 1   meloxicam (MOBIC) 15 MG tablet, TAKE 1/2 TO 1 TABLET EVERY DAY AS NEEDED FOR PAIN, Disp: 90 tablet, Rfl: 0   metFORMIN (GLUCOPHAGE) 1000 MG tablet, Take 1 tablet (1,000 mg total) by mouth 2 (two) times daily., Disp: 180 tablet, Rfl: 3   methocarbamol (ROBAXIN) 500 MG tablet, TAKE 1 TABLET EVERY 8 HOURS AS NEEDED FOR MUSCLE SPASMS., Disp: 90 tablet, Rfl: 3   nystatin (MYCOSTATIN/NYSTOP) powder, Apply 1 Application topically daily as needed (yeast under belly)., Disp: 60 g, Rfl: 2   pantoprazole (PROTONIX) 40 MG tablet, TAKE 1 TABLET TWICE DAILY, Disp: 180 tablet, Rfl: 3   potassium chloride (KLOR-CON M) 10 MEQ tablet, Take 1 tablet (10 mEq total) by mouth daily., Disp: 90 tablet, Rfl: 2   rosuvastatin (CRESTOR) 20 MG tablet, Take 1 tablet (20 mg total) by mouth at bedtime., Disp: 90 tablet, Rfl: 1   spironolactone (ALDACTONE) 25 MG tablet, Take 0.5 tablets (12.5 mg  total) by mouth daily., Disp: 45 tablet, Rfl: 3   TRUE METRIX BLOOD GLUCOSE TEST test strip, Use 2x a day, Disp: 200 each, Rfl: 3   valsartan (DIOVAN) 80 MG tablet, TAKE 1 TABLET (80 MG TOTAL) BY MOUTH DAILY., Disp: 90 tablet, Rfl: 3  Patient Active Problem List   Diagnosis Date Noted   Personal history of colonic polyps 05/05/2022   Polyp of descending colon 05/05/2022   Polyp of ascending colon 05/05/2022   Cecal polyp 05/05/2022   Acute bronchitis with COPD (HCC) 03/02/2022   Class 2 severe obesity with serious comorbidity and  body mass index (BMI) of 38.0 to 38.9 in adult [facility]) 10/21/2021   Hepatic cirrhosis (HCC) 10/21/2021   Coronary artery disease involving native heart 10/21/2021   Aortic atherosclerosis (HCC) 10/21/2021   Cervical spondylosis without myelopathy 10/16/2021   Cervical radiculopathy 10/16/2021   Uncontrolled diabetes mellitus with hyperglycemia, with long-term current use of insulin (HCC) 11/29/2020   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 05/13/2020   Chronic cough 05/13/2020   COPD (chronic obstructive pulmonary disease) (HCC) 05/13/2020   Anemia of chronic disease 09/05/2017   Hypertension 07/14/2016   Hyperlipemia 07/14/2016   Bilateral lower extremity edema 07/14/2016   GERD (gastroesophageal reflux disease) 07/14/2016    Past Surgical History:  Procedure Laterality Date   BREAST BIOPSY Left 2008   CORE W/CLIP - NEG   BRONCHIAL BIOPSY  05/19/2020   Procedure: BRONCHIAL BIOPSIES;  Surgeon: Leslye Peer, MD;  Location: [facility] ENDOSCOPY;  Service: Pulmonary;;   BRONCHIAL BRUSHINGS  05/19/2020   Procedure: BRONCHIAL BRUSHINGS;  Surgeon: Leslye Peer, MD;  Location[facility] ENDOSCOPY;  Service: Pulmonary;;   BRONCHIAL NEEDLE ASPIRATION BIOPSY  05/19/2020   Procedure: BRONCHIAL NEEDLE ASPIRATION BIOPSIES;  Surgeon: Leslye Peer, MD;  Location: [facility] ENDOSCOPY;  Service: Pulmonary;;   BRONCHIAL WASHINGS  05/19/2020   Procedure: BRONCHIAL WASHINGS;  Surgeon: Leslye Peer, MD;  Location[facility] ENDOSCOPY;  Service: Pulmonary;;   COLONOSCOPY WITH PROPOFOL N/A 02/08/2017   Procedure: COLONOSCOPY WITH PROPOFOL;  Surgeon: Christena Deem, MD;  Location: [facility] ENDOSCOPY;  Service: Endoscopy;  Laterality: N/A;   COLONOSCOPY WITH PROPOFOL N/A 05/05/2022   Procedure: COLONOSCOPY WITH PROPOFOL;  Surgeon: Toney Reil, MD;  Location: [facility] ENDOSCOPY;  Service: Gastroenterology;  Laterality: N/A;   ESOPHAGOGASTRODUODENOSCOPY (EGD) WITH PROPOFOL N/A 02/08/2017   Procedure: ESOPHAGOGASTRODUODENOSCOPY (EGD) WITH PROPOFOL;  Surgeon: Christena Deem, MD;  Location: [facility] ENDOSCOPY;  Service: Endoscopy;  Laterality: N/A;   TUBAL LIGATION     VIDEO BRONCHOSCOPY WITH ENDOBRONCHIAL NAVIGATION N/A 05/19/2020   Procedure: VIDEO BRONCHOSCOPY WITH ENDOBRONCHIAL NAVIGATION;  Surgeon: Leslye Peer, MD;  Location: [facility] ENDOSCOPY;  Service: Pulmonary;  Laterality: N/A;    Family History  Problem Relation Age of Onset   Diabetes Maternal Grandmother    Breast cancer Neg Hx     Social History   Tobacco Use   Smoking status: Former    Packs/day: 1.00    Years: 40.00    Additional pack years: 0.00    Total pack years: 40.00    Types: Cigarettes    Quit date: 2006    Years since quitting: 18.3   Smokeless tobacco: Never   Tobacco comments:    Smoking cessation materials not required  Vaping Use   Vaping Use: Never used  Substance Use Topics   Alcohol use: No   Drug use: No     Allergies  Allergen Reactions   Penicillins Hives    Reaction: 1965    Health Maintenance  Topic Date Due   MAMMOGRAM  03/08/2019   HEMOGLOBIN A1C  05/18/2022   Zoster Vaccines- Shingrix (1 of 2) 08/31/2022 (Originally 10/30/1964)   FOOT EXAM  08/12/2022   INFLUENZA VACCINE  08/19/2022   OPHTHALMOLOGY EXAM  10/07/2022   Medicare Annual Wellness (AWV)  10/21/2022   Diabetic kidney evaluation - Urine ACR  10/22/2022   Diabetic kidney evaluation - eGFR measurement  05/27/2023    COLONOSCOPY (Pts 45-19yrs Insurance coverage will need to be confirmed)  05/04/2025   Pneumonia Vaccine 65+ Years  old  Completed   DEXA SCAN  Completed   Hepatitis C Screening  Completed   HPV VACCINES  Aged Out   DTaP/Tdap/Td  Discontinued   COVID-19 Vaccine  Discontinued    Chart Review Today: I personally reviewed active problem list, medication list, allergies, family history, social history, health maintenance, notes from last encounter, lab results, imaging with the patient/caregiver today.   Review of Systems  Constitutional: Negative.   HENT: Negative.    Eyes: Negative.   Respiratory: Negative.    Cardiovascular: Negative.   Gastrointestinal: Negative.   Endocrine: Negative.   Genitourinary: Negative.   Musculoskeletal: Negative.   Skin: Negative.   Allergic/Immunologic: Negative.   Neurological: Negative.   Hematological: Negative.   Psychiatric/Behavioral: Negative.    All other systems reviewed and are negative.    Objective:   Vitals:   05/31/22 1421  BP: 116/66  Pulse: 98  Resp: 18  Temp: 98 F (36.7 C)  TempSrc: Oral  SpO2: 96%  Weight: 240 lb 6.4 oz (109 kg)  Height: 5\' 6"  (1.676 m)    Body mass index is 38.8 kg/m.  Physical Exam Vitals and nursing note reviewed.  Constitutional:      General: She is not in acute distress.    Appearance: Normal appearance. She is well-developed. She is obese. She is not ill-appearing, toxic-appearing or diaphoretic.  HENT:     Head: Normocephalic and atraumatic.     Right Ear: External ear normal.     Left Ear: External ear normal.     Nose: Nose normal.     Mouth/Throat:     Mouth: Mucous membranes are moist.     Pharynx: Oropharynx is clear. No oropharyngeal exudate or posterior oropharyngeal erythema.  Eyes:     General: No scleral icterus.       Right eye: No discharge.        Left eye: No discharge.     Conjunctiva/sclera: Conjunctivae normal.  Neck:     Trachea: No tracheal deviation.   Cardiovascular:     Rate and Rhythm: Normal rate and regular rhythm.     Pulses: Normal pulses.     Heart sounds: Normal heart sounds.     No friction rub. No gallop.     Comments: 2+ ankle edema, no pitting pretibial edema Pulmonary:     Effort: Pulmonary effort is normal. No respiratory distress.     Breath sounds: No stridor. Rales present. No wheezing or rhonchi.  Abdominal:     General: Bowel sounds are normal.     Palpations: Abdomen is soft.  Musculoskeletal:        General: Normal range of motion.     Right lower leg: Edema present.     Left lower leg: Edema present.  Skin:    General: Skin is warm and dry.     Coloration: Skin is not jaundiced.     Findings: No rash.  Neurological:     Mental Status: She is alert. Mental status is at baseline.     Motor: No abnormal muscle tone.     Coordination: Coordination normal.     Gait: Gait normal.  Psychiatric:        Mood and Affect: Mood normal.        Behavior: Behavior normal.         Assessment & Plan:     ICD-10-CM   1. Chronic obstructive pulmonary disease with acute exacerbation (HCC)  J44.1    change to  Breztri, it does work better for her, sample given, offered to refill, she declined, re will f/up w$insurance/pulm    2. Bright red rectal bleeding  K62.5 CBC with Differential/Platelet   pt had no further bleeding, was going to retrial ASA with cardiology after colonoscopy found hemorrhoids , we will recheck CBC    3. Thyroid disease  E07.9 TSH   due for labs in the next couple months, ordered today, can continue 50 mcg daily until labs done    4. Hyperlipidemia, unspecified hyperlipidemia type  E78.5 COMPLETE METABOLIC PANEL WITH GFR   stable and well controlled on crestor 20 and zetia    5. Primary hypertension  I10 COMPLETE METABOLIC PANEL WITH GFR   BP well controlled and at goal today on valsartan 80 mg and HCTZ 25    6. 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 Delton Coombes,  her exam is unchanged left mid to lower lung fields with rales    7. Uncontrolled diabetes mellitus with hyperglycemia, with long-term current use of insulin (HCC)  E11.65 Hemoglobin A1c   Z79.4    per endocrinology, pt missed f/up appt, on insulin, farxiga and metformin, will get A1C here if she does not reschedule f/up with endo soon    8. Anemia of chronic disease  D63.8 CBC with Differential/Platelet   due for recheck CBC in the next month - pt verbalized understanding about coming back by for labs in the next 1-2 months    9. Bilateral lower extremity edema  R60.0 furosemide (LASIX) 40 MG tablet   on lasix and spironolactone, has done conservative management, her ankle edema seems worse but edema higher up looks better    10. Hepatic cirrhosis, unspecified hepatic cirrhosis type, unspecified whether ascites present [facility]) Chronic K74.60    last referral went to [facility] for f/up on cirrhosis, but recent colonoscopy was done by [facility]    11. Aortic atherosclerosis (HCC) Chronic I70.0    on aggressive management per PCP and cardiology, recently LDL at goal    12. 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 Delton Coombes f/up Cardiology recommended further testing for CAD - but pt declined  Discussed with Dr. Caralee Ates - pt will have her as PCP and do next routine f/up with her as well.   Danelle Berry, PA-C 05/31/22 2:49 PM
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
## 2022-06-01 ENCOUNTER — Other Ambulatory Visit: Payer: Self-pay | Admitting: *Deleted

## 2022-06-01 MED ORDER — BREZTRI AEROSPHERE 160-9-4.8 MCG/ACT IN AERO: 2.0000 | INHALATION_SPRAY | Freq: Two times a day (BID) | RESPIRATORY_TRACT | 11 refills | Status: DC

## 2022-06-10 ENCOUNTER — Ambulatory Visit: Payer: Medicare HMO | Admitting: Internal Medicine

## 2022-06-10 ENCOUNTER — Encounter: Payer: Self-pay | Admitting: Internal Medicine

## 2022-06-10 VITALS — BP 136/82 | HR 109 | Ht 66.0 in | Wt 236.6 lb

## 2022-06-10 DIAGNOSIS — E1165 Type 2 diabetes mellitus with hyperglycemia: Secondary | ICD-10-CM | POA: Diagnosis not present

## 2022-06-10 DIAGNOSIS — E1159 Type 2 diabetes mellitus with other circulatory complications: Secondary | ICD-10-CM

## 2022-06-10 DIAGNOSIS — E785 Hyperlipidemia, unspecified: Secondary | ICD-10-CM

## 2022-06-10 DIAGNOSIS — Z794 Long term (current) use of insulin: Secondary | ICD-10-CM

## 2022-06-10 DIAGNOSIS — Z6838 Body mass index (BMI) 38.0-38.9, adult: Secondary | ICD-10-CM | POA: Diagnosis not present

## 2022-06-10 DIAGNOSIS — Z7984 Long term (current) use of oral hypoglycemic drugs: Secondary | ICD-10-CM | POA: Diagnosis not present

## 2022-06-10 DIAGNOSIS — E119 Type 2 diabetes mellitus without complications: Secondary | ICD-10-CM

## 2022-06-10 LAB — POCT GLYCOSYLATED HEMOGLOBIN (HGB A1C): Hemoglobin A1C: 7.7 % — AB (ref 4.0–5.6)

## 2022-06-10 MED ORDER — DROPLET PEN NEEDLES 32G X 4 MM MISC: 1.0000 | Freq: Every day | 3 refills | Status: DC

## 2022-06-10 MED ORDER — METFORMIN HCL 1000 MG PO TABS: 1000.0000 mg | ORAL_TABLET | Freq: Two times a day (BID) | ORAL | 3 refills | Status: DC

## 2022-06-10 MED ORDER — NOVOLIN N FLEXPEN 100 UNIT/ML ~~LOC~~ SUPN: PEN_INJECTOR | SUBCUTANEOUS | 3 refills | Status: DC

## 2022-06-10 MED ORDER — TRUE METRIX BLOOD GLUCOSE TEST VI STRP: ORAL_STRIP | 3 refills | Status: DC

## 2022-06-10 NOTE — Patient Instructions (Addendum)
Please continue: - Metformin 1000 mg 2x a day - NPH 36 units in am and 28-30 units at bedtime  Check some sugars before and some after dinner and maybe even at bedtime.  Try to go back to a low calorie density diet.  Please return in 4 months with your sugar log.

## 2022-06-10 NOTE — Progress Notes (Signed)
Patient ID: Tamara [name], female   DOB: 02/07/1945, 77 y.o.   MRN: [id number]  HPI: Tamara [name] is a 77 y.o.-year-old female, returning for follow-up for DM2, dx in 2006, insulin-dependent, uncontrolled, with complications (atherosclerosis) and hypothyroidism Pt. previously saw Dr. Everardo All, but last visit with me 7 months ago. She is the mother of Beena Eleby, also my pt.  She is here with her husband.  Interim hx: No increased urination, blurry vision, nausea. She changed her diet - cut back on carbs -before last visit. She relaxed her diet since. She had bronchitis in 12/2021. She was on steroids x2. Last course in 02/2022.   DM2: Reviewed HbA1c: Lab Results  Component Value Date   HGBA1C 7.7 (A) 11/16/2021   HGBA1C 10.1 (A) 08/11/2021   HGBA1C 8.7 (A) 03/26/2021   HGBA1C 7.3 (A) 11/26/2020   HGBA1C 7.4 (A) 07/18/2020   HGBA1C 7.5 (A) 03/19/2020   HGBA1C 8.0 (A) 12/19/2019   HGBA1C 7.4 (A) 09/17/2019   HGBA1C 9.0 (A) 07/17/2019   HGBA1C 11.3 (A) 05/09/2019   Pt is on a regimen of: - Metformin 1000 mg 2x a day  - NPH  40 >> 36 units in am and 24 >> 28-30 units at bedtime She tried metformin in the past but this caused arthralgias, however, she restarted as her CBGs were in the 400 She was previously on insulin in 2017-2018, but came off after losing approximately 40 pounds. Per Dr. George Hugh note, she previously declined multiple daily insulin injections. We tried [place] 07/2021 >> $$$.  Pt checks her sugars 2x a day and they are: - am: 200-300, 400 >> 97-120s >> 130-169, 183, 187 >> 121-143 - 2h after b'fast: n/c - before lunch: 129 >> 111, 133, 137, 151, 157 >> n/c >> 120-167 - 2h after lunch: n/c  >> 146- 177 >> 131, 137, 162 - before dinner: n/c >> 101, 104 >> n/c - 2h after dinner: n/c - bedtime: 164 >> n/c - nighttime: n/c Lowest sugar was 97 >> 101 >> 120; she has hypoglycemia awareness at 70.  Highest sugar was 400 >> 187 >> 167.  Glucometer:  True Metrix  - no CKD, last BUN/creatinine:  Lab Results  Component Value Date   BUN 17 05/27/2022   BUN 16 04/29/2022   CREATININE 1.15 (H) 05/27/2022   CREATININE 0.98 04/29/2022  She is not on ACE inhibitor/ARB.  -+ HL; last set of lipids: Lab Results  Component Value Date   CHOL 140 03/15/2022   HDL 50 03/15/2022   LDLCALC 65 03/15/2022   LDLDIRECT 64 03/15/2022   TRIG 148 03/15/2022   CHOLHDL 2.8 03/15/2022  On Crestor 20 mg daily.  - last eye exam was in 09/2021. No DR reportingly. + cataracts.  - no numbness and tingling in her feet.  Last foot exam 07/2021.  Reviewed latest imaging: CT chest (08/06/2021): Atherosclerosis of aorta, great vessels, and coronary arteries.  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  Of note, this mass was not FDG avid on PET scan.  Hypothyroidism:  She is on levothyroxine 50 mcg daily: - in am - fasting - at least 30 min from b'fast - no calcium - no iron - no multivitamins - + PPIs - Protonix 30 min later! >> moved >4h later - not on Biotin  Reviewed her latest TSH levels: Lab Results  Component Value Date   TSH 3.34 08/26/2021   TSH 3.88 07/28/2020   TSH 2.24  11/15/2019   TSH 2.45 03/22/2019   TSH 4.62 (H) 11/22/2018   TSH 3.92 05/26/2017   She also has a history of HTN, GERD.  ROS: + see HPI  Past Medical History:  Diagnosis Date   Arthritis    knee and shoulders   Colon polyps 09/05/2017   COPD (chronic obstructive pulmonary disease) (HCC)    Diabetes mellitus without complication (HCC)    type II   Dyspnea    05/16/20 has had a cough for 2.5 years post Covid   Gastritis 09/05/2017   GERD (gastroesophageal reflux disease)    Hyperlipidemia    Hypertension    patient denies   Hypothyroidism    Panic attack    RUQ pain 03/09/2017   Per patient, she has had RUQ pain 4-5 years that has grown worse in the last few months.   Past Surgical History:  Procedure Laterality Date    BREAST BIOPSY Left 2008   CORE W/CLIP - NEG   BRONCHIAL BIOPSY  05/19/2020   Procedure: BRONCHIAL BIOPSIES;  Surgeon: Leslye Peer, MD;  Location: [facility] ENDOSCOPY;  Service: Pulmonary;;   BRONCHIAL BRUSHINGS  05/19/2020   Procedure: BRONCHIAL BRUSHINGS;  Surgeon: Leslye Peer, MD;  Location[facility] ENDOSCOPY;  Service: Pulmonary;;   BRONCHIAL NEEDLE ASPIRATION BIOPSY  05/19/2020   Procedure: BRONCHIAL NEEDLE ASPIRATION BIOPSIES;  Surgeon: Leslye Peer, MD;  Location: [facility] ENDOSCOPY;  Service: Pulmonary;;   BRONCHIAL WASHINGS  05/19/2020   Procedure: BRONCHIAL WASHINGS;  Surgeon: Leslye Peer, MD;  Location: [facility] ENDOSCOPY;  Service: Pulmonary;;   COLONOSCOPY WITH PROPOFOL N/A 02/08/2017   Procedure: COLONOSCOPY WITH PROPOFOL;  Surgeon: Christena Deem, MD;  Location[facility] ENDOSCOPY;  Service: Endoscopy;  Laterality: N/A;   COLONOSCOPY WITH PROPOFOL N/A 05/05/2022   Procedure: COLONOSCOPY WITH PROPOFOL;  Surgeon: Toney Reil, MD;  Location[facility] ENDOSCOPY;  Service: Gastroenterology;  Laterality: N/A;   ESOPHAGOGASTRODUODENOSCOPY (EGD) WITH PROPOFOL N/A 02/08/2017   Procedure: ESOPHAGOGASTRODUODENOSCOPY (EGD) WITH PROPOFOL;  Surgeon: Christena Deem, MD;  Location: [facility] ENDOSCOPY;  Service: Endoscopy;  Laterality: N/A;   TUBAL LIGATION     VIDEO BRONCHOSCOPY WITH ENDOBRONCHIAL NAVIGATION N/A 05/19/2020   Procedure: VIDEO BRONCHOSCOPY WITH ENDOBRONCHIAL NAVIGATION;  Surgeon: Leslye Peer, MD;  Location: [facility] ENDOSCOPY;  Service: Pulmonary;  Laterality: N/A;   Social History   Socioeconomic History   Marital status: Married    Spouse name: Zorina Stahley   Number of children: 3   Years of education: Not on file   Highest education level: High school graduate  Occupational History   Occupation: retired  Tobacco Use   Smoking status: Former    Packs/day: 1.00    Years: 40.00    Additional pack years: 0.00    Total pack years: 40.00    Types: Cigarettes    Quit date: 2006    Years  since quitting: 18.4   Smokeless tobacco: Never   Tobacco comments:    Smoking cessation materials not required  Vaping Use   Vaping Use: Never used  Substance and Sexual Activity   Alcohol use: No   Drug use: No   Sexual activity: Not on file  Other Topics Concern   Not on file  Social History Narrative   Not on file   Social Determinants of Health   Financial Resource Strain: Medium Risk (10/20/2021)   Overall Financial Resource Strain (CARDIA)    Difficulty of Paying Living Expenses: Somewhat hard  Food Insecurity: No Food Insecurity (  05/04/2022)   Hunger Vital Sign    Worried About Running Out of Food in the Last Year: Never true    Ran Out of Food in the Last Year: Never true  Transportation Needs: No Transportation Needs (05/04/2022)   PRAPARE - Administrator, Civil Service (Medical): No    Lack of Transportation (Non-Medical): No  Physical Activity: Inactive (10/20/2021)   Exercise Vital Sign    Days of Exercise per Week: 0 days    Minutes of Exercise per Session: 0 min  Stress: Stress Concern Present (10/20/2021)   [employer] of Occupational Health - Occupational Stress Questionnaire    Feeling of Stress : To some extent  Social Connections: Moderately Integrated (10/20/2021)   Social Connection and Isolation Panel [NHANES]    Frequency of Communication with Friends and Family: More than three times a week    Frequency of Social Gatherings with Friends and Family: More than three times a week    Attends Religious Services: 1 to 4 times per year    Active Member of [employer] or Organizations: No    Attends Banker Meetings: Never    Marital Status: Married  Catering manager Violence: Not At Risk (10/20/2021)   Humiliation, Afraid, Rape, and Kick questionnaire    Fear of Current or Ex-Partner: No    Emotionally Abused: No    Physically Abused: No    Sexually Abused: No   Current Outpatient Medications on File Prior to Visit  Medication Sig  Dispense Refill   Accu-Chek Softclix Lancets lancets TEST BLOOD SUGAR TWICE DAILY 200 each 11   albuterol (PROVENTIL) (2.5 MG/3ML) 0.083% nebulizer solution Take 3 mLs (2.5 mg total) by nebulization every 6 (six) hours as needed for wheezing or shortness of breath. 75 mL 5   albuterol (VENTOLIN HFA) 108 (90 Base) MCG/ACT inhaler Inhale 2 puffs into the lungs every 6 (six) hours as needed for wheezing or shortness of breath. 8 g 6   Alcohol Swabs (B-D SINGLE USE SWABS REGULAR) PADS USE  TO TEST BLOOD SUGAR TWICE DAILY 200 each 3   Ascorbic Acid (VITAMIN C) 1000 MG tablet Take 1,000 mg by mouth daily.     aspirin EC 81 MG tablet Take 1 tablet (81 mg total) by mouth daily. Swallow whole. 90 tablet 3   Budeson-Glycopyrrol-Formoterol (BREZTRI AEROSPHERE) 160-9-4.8 MCG/ACT AERO Inhale 2 puffs into the lungs in the morning and at bedtime. 10.7 g 11   Cholecalciferol (VITAMIN D) 50 MCG (2000 UT) CAPS Take 1,000 Units by mouth daily.     dapagliflozin propanediol (FARXIGA) 5 MG TABS tablet Take 1 tablet (5 mg total) by mouth daily before breakfast. 90 tablet 3   DROPLET PEN NEEDLES 32G X 4 MM MISC 1 Device by Other route daily. 200 each 3   ezetimibe (ZETIA) 10 MG tablet Take 1 tablet (10 mg total) by mouth daily. 90 tablet 3   furosemide (LASIX) 40 MG tablet Take 1 tablet (40 mg total) by mouth daily. 90 tablet 1   gabapentin (NEURONTIN) 300 MG capsule Take by mouth.     hydrochlorothiazide (HYDRODIURIL) 25 MG tablet TAKE 1 TABLET EVERY DAY 90 tablet 3   Insulin NPH, Human,, Isophane, (NOVOLIN N FLEXPEN) 100 UNIT/ML Kiwkpen Inject under skin 36 units each morning, and 28-30 units each evening 60 mL 3   levothyroxine (SYNTHROID) 50 MCG tablet Take 1 tablet (50 mcg total) by mouth daily before breakfast. 90 tablet 1   meloxicam (MOBIC) 15 MG  tablet TAKE 1/2 TO 1 TABLET EVERY DAY AS NEEDED FOR PAIN 90 tablet 0   metFORMIN (GLUCOPHAGE) 1000 MG tablet Take 1 tablet (1,000 mg total) by mouth 2 (two) times daily.  180 tablet 3   nystatin (MYCOSTATIN/NYSTOP) powder Apply 1 Application topically daily as needed (yeast under belly). 60 g 2   pantoprazole (PROTONIX) 40 MG tablet TAKE 1 TABLET TWICE DAILY 180 tablet 3   potassium chloride (KLOR-CON M) 10 MEQ tablet Take 1 tablet (10 mEq total) by mouth daily. 90 tablet 2   rosuvastatin (CRESTOR) 20 MG tablet Take 1 tablet (20 mg total) by mouth at bedtime. 90 tablet 1   spironolactone (ALDACTONE) 25 MG tablet Take 0.5 tablets (12.5 mg total) by mouth daily. 45 tablet 3   TRUE METRIX BLOOD GLUCOSE TEST test strip Use 2x a day 200 each 3   valsartan (DIOVAN) 80 MG tablet TAKE 1 TABLET (80 MG TOTAL) BY MOUTH DAILY. 90 tablet 3   No current facility-administered medications on file prior to visit.   Allergies  Allergen Reactions   Penicillins Hives    Reaction: 1964   Family History  Problem Relation Age of Onset   Diabetes Maternal Grandmother    Breast cancer Neg Hx    PE: BP 136/82 (BP Location: Left Arm, Patient Position: Sitting, Cuff Size: Normal)   Pulse (!) 109   Ht 5\' 6"  (1.676 m)   Wt 236 lb 9.6 oz (107.3 kg)   SpO2 99%   BMI 38.19 kg/m  Wt Readings from Last 3 Encounters:  06/10/22 236 lb 9.6 oz (107.3 kg)  05/31/22 240 lb 6.4 oz (109 kg)  05/18/22 242 lb 3.2 oz (109.9 kg)   Constitutional: overweight, in NAD Eyes: no exophthalmos ENT: moist mucous membranes, no thyromegaly, no cervical lymphadenopathy Cardiovascular: RRR, No MRG, + B LE edema Respiratory: CTA R lung, but wheezing inf. L lung Musculoskeletal: no deformities Skin:  no rashes Neurological: no tremor with outstretched hands Diabetic Foot Exam - Simple   Simple Foot Form Diabetic Foot exam was performed with the following findings: Yes 06/10/2022  2:45 PM  Visual Inspection No deformities, no ulcerations, no other skin breakdown bilaterally: Yes Sensation Testing Intact to touch and monofilament testing bilaterally: Yes Pulse Check Posterior Tibialis and Dorsalis  pulse intact bilaterally: Yes Comments + B periankle swelling     ASSESSMENT: 1. DM2, insulin-dependent, uncontrolled, without long-term complications, but with hyperglycemia  2.  Hypothyroidism  3. Hyperlipidemia  PLAN:  1. Patient with longstanding, uncontrolled, type 2 diabetes, on intermediate acting insulin and metformin.  At last, HbA1c was much better, at 7.7%, decreased from 10.1%.  Sugars were still elevated in the morning and before lunch and they were improving later in the day.  She was not taking at bedtime, which sometimes was very late, even at 4 in the morning), so we discussed about increasing the NPH dose at night and we decreased the dose in the morning and try to check some sugars at bedtime or in the evening.  She was off [place] but we could not restart this due to being in the donut hole. -At today's visit, sugars are at or above target in the morning and before lunch (she wakes up late) and at target after lunch.  She is not checking sugars in the evening and we discussed about starting to do so.  Also, she relaxed her diet since last visit and we discussed about the importance of resuming a low calorie density  diet.  She has tried a low-fat low-carb diet in the past and she was able to lose almost 40 pounds and dropped her HbA1c under 6%. -For now, we will continue the regimen.  I refilled her prescriptions. - I suggested to:  Patient Instructions  Please continue: - Metformin 1000 mg 2x a day - NPH 36 units in am and 28-30 units at bedtime  Check some sugars before and some after dinner and maybe even at bedtime.  Try to go back to a low calorie density diet.  Please return in 4 months with your sugar log.   - we checked her HbA1c: 7.7% (stable) - advised to check sugars at different times of the day - 2x a day, rotating check times - advised for yearly eye exams >> she is UTD - return to clinic in 3-4 months  2. Hypothyroidism: - latest thyroid labs  reviewed with pt. >> normal: Lab Results  Component Value Date   TSH 3.34 08/26/2021  - she continues on LT4 50 mcg daily - pt feels good on this dose. - we discussed about taking the thyroid hormone every day, with water, >30 minutes before breakfast, separated by >4 hours from acid reflux medications, calcium, iron, multivitamins. Pt. is taking it correctly. - will check thyroid tests at next OV  3. HL -Reviewed latest lipid panel from 02/2022: Fractions at goal: Lab Results  Component Value Date   CHOL 140 03/15/2022   HDL 50 03/15/2022   LDLCALC 65 03/15/2022   LDLDIRECT 64 03/15/2022   TRIG 148 03/15/2022   CHOLHDL 2.8 03/15/2022  -She continues Crestor 20 mg daily and ezetimibe 10 mg daily without side effect  Carlus Pavlov, MD PhD [facility] Endocrinology

## 2022-06-25 ENCOUNTER — Telehealth: Payer: Self-pay | Admitting: Emergency Medicine

## 2022-06-25 NOTE — Telephone Encounter (Signed)
Called and left message for pt to call [facility] herself for her Budeson-Glycopyrrol-Formoterol (BREZTRI AEROSPHERE) 160-9-4.8 MCG/ACT AERO refills. Nothing further needed.

## 2022-06-25 NOTE — Telephone Encounter (Signed)
Patient called requested refill of Tamara [name]- informed patient a script was sent to [facility] on 5/14 with 11 refills. Patient confirmed that is the correct pharmacy but she never got it. Asked if she called them she states no and would like our office to call [facility]

## 2022-06-27 ENCOUNTER — Other Ambulatory Visit: Payer: Self-pay | Admitting: Internal Medicine

## 2022-06-27 ENCOUNTER — Other Ambulatory Visit: Payer: Self-pay | Admitting: Family Medicine

## 2022-06-27 DIAGNOSIS — M25512 Pain in left shoulder: Secondary | ICD-10-CM

## 2022-06-30 ENCOUNTER — Ambulatory Visit: Payer: Medicare HMO | Admitting: Nurse Practitioner

## 2022-07-03 ENCOUNTER — Other Ambulatory Visit: Payer: Self-pay | Admitting: Internal Medicine

## 2022-07-03 DIAGNOSIS — E1165 Type 2 diabetes mellitus with hyperglycemia: Secondary | ICD-10-CM

## 2022-07-26 ENCOUNTER — Other Ambulatory Visit: Payer: Self-pay | Admitting: Internal Medicine

## 2022-07-26 DIAGNOSIS — E785 Hyperlipidemia, unspecified: Secondary | ICD-10-CM

## 2022-07-26 DIAGNOSIS — E079 Disorder of thyroid, unspecified: Secondary | ICD-10-CM
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
## 2022-07-28 ENCOUNTER — Other Ambulatory Visit: Payer: Self-pay | Admitting: Family Medicine

## 2022-08-02 ENCOUNTER — Ambulatory Visit (HOSPITAL_BASED_OUTPATIENT_CLINIC_OR_DEPARTMENT_OTHER)
Admission: RE | Admit: 2022-08-02 | Discharge: 2022-08-02 | Disposition: A | Discharge status: Home / Self Care | Payer: Medicare HMO | Source: Ambulatory Visit | Attending: Emergency Medicine | Admitting: Emergency Medicine

## 2022-08-02 DIAGNOSIS — R918 Other nonspecific abnormal finding of lung field: Secondary | ICD-10-CM | POA: Insufficient documentation

## 2022-08-02 DIAGNOSIS — J984 Other disorders of lung: Secondary | ICD-10-CM | POA: Diagnosis not present

## 2022-08-04 ENCOUNTER — Other Ambulatory Visit: Payer: Self-pay | Admitting: Internal Medicine

## 2022-08-04 DIAGNOSIS — E1165 Type 2 diabetes mellitus with hyperglycemia: Secondary | ICD-10-CM

## 2022-08-04 DIAGNOSIS — J441 Chronic obstructive pulmonary disease with (acute) exacerbation: Secondary | ICD-10-CM

## 2022-08-16 ENCOUNTER — Encounter: Payer: Self-pay | Admitting: Pharmacist

## 2022-08-16 NOTE — Progress Notes (Signed)
   08/16/2022  Patient ID: Tamara [name], female   DOB: [birth year], 77 y.o.   MRN: [id number]  Tamara [name] was referred to Clinical Pharmacist for assistance with cost of medications for diabetes and COPD.  From review of chart, note patient previously engaged with Boneta Lucks. Per note from Alex from 03/24/2022, patient enrolled in [place] patient assistance from [employer] through 01/18/2023.  Outreach to patient by telephone today.  Based on reported income, patient does not meet criteria for Extra Help subsidy through [employer].  Reports has $105 copay for her NPH insulin. Confirm from review of dispensing history that patient is receiving a 90 day supply for this copayment.  Patient request assistance with cost of her maintenance inhaler. Reports previously using Breztri inhaler 2 puffs twice daily as prescribed by Pulmonologist in March. Reports used for 3 months, but then switched back to her Stiolto inhaler as felt like her breathing had gotten worse. Reports currently using Stiolto 2 inhalations daily. Reports copayment for either inhaler is difficult for her to afford  Plan:  1) Patient to contact her Pulmonology office today to follow up with provider/let provider know that she is currently using Stiolto again, rather than Breztri.  2) Counsel patient on [employer] COPD inhaler program to obtain her maintenance inhaler for no cost through mail order. Advise patient to contact program at [phone] to enroll  3) Schedule an appointment with patient to follow up/complete medication review on 08/25/2022 at 2 pm   Estelle Grumbles, PharmD, [facility] [phone]

## 2022-08-16 NOTE — Patient Instructions (Signed)
Goals Addressed             This Visit's Progress    Pharmacy Goals       Please contact Pulmonology at [phone] about your maintenance inhaler prescription.  You can contact [employer] about the COPD inhaler program at [phone]  Thank you!  Estelle Grumbles, PharmD, [facility] [phone]

## 2022-08-16 NOTE — Telephone Encounter (Signed)
This encounter was created in error - please disregard.

## 2022-08-25 ENCOUNTER — Other Ambulatory Visit: Payer: Medicare HMO | Admitting: Pharmacist
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
## 2022-08-25 NOTE — Patient Instructions (Signed)
Goals Addressed             This Visit's Progress    Pharmacy Goals       Please contact Pulmonology at [phone] about your maintenance inhaler prescription.  You can contact [employer] about the COPD inhaler program at [phone]  Thank you!  Estelle Grumbles, PharmD, [facility] [phone]

## 2022-09-08 ENCOUNTER — Encounter: Payer: Self-pay | Admitting: Emergency Medicine

## 2022-09-08 ENCOUNTER — Ambulatory Visit: Payer: Medicare HMO | Admitting: Emergency Medicine

## 2022-09-08 VITALS — BP 126/68 | HR 99 | Ht 66.0 in | Wt 236.4 lb

## 2022-09-08 DIAGNOSIS — R918 Other nonspecific abnormal finding of lung field: Secondary | ICD-10-CM

## 2022-09-08 DIAGNOSIS — J441 Chronic obstructive pulmonary disease with (acute) exacerbation: Secondary | ICD-10-CM

## 2022-09-08 DIAGNOSIS — J984 Other disorders of lung: Secondary | ICD-10-CM | POA: Diagnosis not present

## 2022-09-08 MED ORDER — STIOLTO RESPIMAT 2.5-2.5 MCG/ACT IN AERS: 2.0000 | INHALATION_SPRAY | Freq: Every day | RESPIRATORY_TRACT | 6 refills | Status: DC

## 2022-09-08 NOTE — Assessment & Plan Note (Signed)
Her original rounded mass lesion was biopsy negative and has not really changed over time on serial imaging.  Now she has an adjacent left lower lobe opacity in the superior segment, looks similar and etiology unclear.  She denies any overt pneumonia although she does cough, have yellow sputum.  Also she denies any chronic aspiration but this is certainly a possibility.  I explained to her today that she may need repeat bronchoscopy and biopsies.  For now I will check autoimmune labs, a PET scan, a modified barium swallow to ensure no evidence of occult aspiration.  Will follow-up after these results and decide whether repeat bronchoscopy is warranted

## 2022-09-08 NOTE — Assessment & Plan Note (Signed)
She would like to change back from [place] to [place] which I think is reasonable given her increased cough, dyspnea.  We will make that change today and follow her symptoms.

## 2022-09-08 NOTE — Addendum Note (Signed)
Addended by: Glynda Jaeger on: 09/08/2022 11:25 AM   Modules accepted: Orders
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
## 2022-09-08 NOTE — Patient Instructions (Signed)
We reviewed your CT scan of the chest today. We will arrange for PET scan We will arrange for a modified barium swallow test We will perform lab work today Follow with Dr. Delton Coombes next available after your testing so we can review those results and plan next steps in your evaluation

## 2022-09-09 ENCOUNTER — Other Ambulatory Visit: Payer: Self-pay | Admitting: Family Medicine

## 2022-09-09 ENCOUNTER — Other Ambulatory Visit (HOSPITAL_COMMUNITY): Payer: Self-pay | Admitting: *Deleted

## 2022-09-09 DIAGNOSIS — R1310 Dysphagia, unspecified: Secondary | ICD-10-CM

## 2022-09-09 DIAGNOSIS — M25512 Pain in left shoulder: Secondary | ICD-10-CM

## 2022-09-10 LAB — ANCA SCREEN W REFLEX TITER: ANCA SCREEN: NEGATIVE

## 2022-09-10 LAB — ANGIOTENSIN CONVERTING ENZYME: Angiotensin-Converting Enzyme: 47 U/L (ref 9–67)

## 2022-09-10 LAB — RHEUMATOID FACTOR: Rheumatoid fact SerPl-aCnc: <10 IU/mL (ref <14)

## 2022-09-11 LAB — ANA+ENA+DNA/DS+SCL 70+SJOSSA/B
ANA Titer 1: NEGATIVE
ENA RNP Ab: 0.9 AI (ref 0.0–0.9)
ENA SM Ab Ser-aCnc: <0.2 AI (ref 0.0–0.9)
ENA SSA (RO) Ab: <0.2 AI (ref 0.0–0.9)
ENA SSB (LA) Ab: <0.2 AI (ref 0.0–0.9)
Scleroderma (Scl-70) (ENA) Antibody, IgG: <0.2 AI (ref 0.0–0.9)
dsDNA Ab: <1 [IU]/mL (ref 0–9)

## 2022-09-14 ENCOUNTER — Ambulatory Visit: Payer: Medicare HMO

## 2022-09-14 ENCOUNTER — Ambulatory Visit: Payer: Medicare HMO | Admitting: Internal Medicine

## 2022-09-14 ENCOUNTER — Encounter: Payer: Self-pay | Admitting: Internal Medicine

## 2022-09-14 VITALS — BP 140/80 | HR 94 | Temp 98.7°F | Ht 66.0 in | Wt 234.4 lb

## 2022-09-14 DIAGNOSIS — J440 Chronic obstructive pulmonary disease with acute lower respiratory infection: Secondary | ICD-10-CM | POA: Diagnosis not present

## 2022-09-14 DIAGNOSIS — J209 Acute bronchitis, unspecified: Secondary | ICD-10-CM

## 2022-09-14 DIAGNOSIS — R0602 Shortness of breath: Secondary | ICD-10-CM | POA: Diagnosis not present

## 2022-09-14 DIAGNOSIS — R918 Other nonspecific abnormal finding of lung field: Secondary | ICD-10-CM

## 2022-09-14 DIAGNOSIS — J441 Chronic obstructive pulmonary disease with (acute) exacerbation: Secondary | ICD-10-CM

## 2022-09-14 LAB — CBC WITH DIFFERENTIAL/PLATELET
Basophils Absolute: 0 10*3/uL (ref 0.0–0.1)
Basophils Relative: 0.8 % (ref 0.0–3.0)
Eosinophils Absolute: 0.1 10*3/uL (ref 0.0–0.7)
Eosinophils Relative: 3.6 % (ref 0.0–5.0)
HCT: 31.3 % — ABNORMAL LOW (ref 36.0–46.0)
Hemoglobin: 10.1 g/dL — ABNORMAL LOW (ref 12.0–15.0)
Lymphocytes Relative: 30.7 % (ref 12.0–46.0)
Lymphs Abs: 1.1 10*3/uL (ref 0.7–4.0)
MCHC: 32.1 g/dL (ref 30.0–36.0)
MCV: 77.3 fl — ABNORMAL LOW (ref 78.0–100.0)
Monocytes Absolute: 0.5 10*3/uL (ref 0.1–1.0)
Monocytes Relative: 13.9 % — ABNORMAL HIGH (ref 3.0–12.0)
Neutro Abs: 1.8 10*3/uL (ref 1.4–7.7)
Neutrophils Relative %: 51 % (ref 43.0–77.0)
Platelets: 145 10*3/uL — ABNORMAL LOW (ref 150.0–400.0)
RBC: 4.06 Mil/uL (ref 3.87–5.11)
RDW: 16 % — ABNORMAL HIGH (ref 11.5–15.5)
WBC: 3.4 10*3/uL — ABNORMAL LOW (ref 4.0–10.5)

## 2022-09-14 LAB — BASIC METABOLIC PANEL
BUN: 20 mg/dL (ref 6–23)
CO2: 23 mEq/L (ref 19–32)
Calcium: 9.6 mg/dL (ref 8.4–10.5)
Chloride: 100 mEq/L (ref 96–112)
Creatinine, Ser: 1.28 mg/dL — ABNORMAL HIGH (ref 0.40–1.20)
GFR: 40.88 mL/min — ABNORMAL LOW (ref >60.00)
Glucose, Bld: 224 mg/dL — ABNORMAL HIGH (ref 70–99)
Potassium: 4.1 mEq/L (ref 3.5–5.1)
Sodium: 135 mEq/L (ref 135–145)

## 2022-09-14 MED ORDER — PREDNISONE 10 MG PO TABS: ORAL_TABLET | ORAL | 0 refills | Status: AC

## 2022-09-14 MED ORDER — LEVOFLOXACIN 500 MG PO TABS: 500.0000 mg | ORAL_TABLET | Freq: Every day | ORAL | 0 refills | Status: DC

## 2022-09-14 MED ORDER — STIOLTO RESPIMAT 2.5-2.5 MCG/ACT IN AERS: 2.0000 | INHALATION_SPRAY | Freq: Every day | RESPIRATORY_TRACT | Status: DC

## 2022-09-14 NOTE — Addendum Note (Signed)
Addended by: Hedda Slade on: 09/14/2022 05:25 PM   Modules accepted: Orders

## 2022-09-14 NOTE — Addendum Note (Signed)
Addended by: Hedda Slade on: 09/14/2022 03:06 PM   Modules accepted: Orders

## 2022-09-14 NOTE — Addendum Note (Signed)
Addended by: Hedda Slade on: 09/14/2022 03:04 PM   Modules accepted: Orders
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
## 2022-09-14 NOTE — Addendum Note (Signed)
Addended by: Bernerd Pho I on: 09/14/2022 03:19 PM   Modules accepted: Orders

## 2022-09-14 NOTE — Patient Instructions (Addendum)
ICD-10-CM   1. Acute bronchitis with COPD (HCC)  J44.0    J20.9     2. Lung mass  R91.8       - do cbc with diff, IgE, inr, BMET  - d cxr 2 view  -  take levaquin 500mg  once daily  X 6 days (QTc 447 msec in oct 2023)  - watch for tendon problems  - Please take prednisone 40 mg x1 day, then 30 mg x1 day, then 20 mg x1 day, then 10 mg x1 day, and then 5 mg x1 day and stop  FOLLOWUP - keep appts outlined by Dr Delton Coombes recently

## 2022-09-15 LAB — PROTIME-INR
INR: 1.1
Prothrombin Time: 11.8 s — ABNORMAL HIGH (ref 9.0–11.5)

## 2022-09-15 LAB — IGE: IgE (Immunoglobulin E), Serum: 17 kU/L (ref <=114)

## 2022-09-21 DIAGNOSIS — U071 COVID-19: Secondary | ICD-10-CM | POA: Diagnosis not present

## 2022-09-21 DIAGNOSIS — R0789 Other chest pain: Secondary | ICD-10-CM | POA: Diagnosis not present

## 2022-09-22 NOTE — Progress Notes (Unsigned)
Tamara [name], female    DOB: 12-23-1945   MRN: [id number]   Brief patient profile:  76  yowf  quit smoking 2006 self-referred to pulmonary clinic 09/23/2022   for fever nausea/purulent sputum       Says never better p pna around 2019 followed by DR Bryrum   Onset   acutely ill  x 3 weeks prior to   Aug 27 ov by [facility]  rx levaquin 500/pred x 6 days more nauseated  while on it can't eat at all feels like needs to vomit but can't.  History of Present Illness  09/23/2022  Pulmonary/ Acute  office eval/Sophira Rumler  Chief Complaint  Patient presents with   Acute Visit    Wheeze, cough, SOB, sore throat, nausea x 3 weeks.  Dyspnea:  sob at rest in w/c - no cp  Cough: severe to point of  gag>>>  brown mucus / no vomit  Sleep: recliner x 45 degrees x years  SABA use: not helping including neb   Not able to eat or drink anything x days      Outpatient Medications Prior to Visit  Medication Sig Dispense Refill   Accu-Chek Softclix Lancets lancets TEST BLOOD SUGAR TWO TIMES DAILY 200 each 3   albuterol (PROVENTIL) (2.5 MG/3ML) 0.083% nebulizer solution Take 3 mLs (2.5 mg total) by nebulization every 6 (six) hours as needed for wheezing or shortness of breath. 75 mL 5   albuterol (VENTOLIN HFA) 108 (90 Base) MCG/ACT inhaler Inhale 2 puffs into the lungs every 6 (six) hours as needed for wheezing or shortness of breath. 8 g 6   Alcohol Swabs (DROPSAFE ALCOHOL PREP) 70 % PADS USE AS DIRECTED TWICE DAILY WHEN TESTING BLOOD SUGAR 200 each 3   Ascorbic Acid (VITAMIN C) 1000 MG tablet Take 1,000 mg by mouth daily.     aspirin EC 81 MG tablet Take 1 tablet (81 mg total) by mouth daily. Swallow whole. 90 tablet 3   Cholecalciferol (VITAMIN D) 50 MCG (2000 UT) CAPS Take 1,000 Units by mouth daily.     dapagliflozin propanediol (FARXIGA) 5 MG TABS tablet Take 1 tablet (5 mg total) by mouth daily before breakfast. 90 tablet 3   DROPLET PEN NEEDLES 32G X 4 MM MISC 1 Device by Other route daily.  200 each 3   ezetimibe (ZETIA) 10 MG tablet Take 1 tablet (10 mg total) by mouth daily. 90 tablet 3   furosemide (LASIX) 40 MG tablet Take 1 tablet (40 mg total) by mouth daily. 90 tablet 1   gabapentin (NEURONTIN) 300 MG capsule Take by mouth.     hydrochlorothiazide (HYDRODIURIL) 25 MG tablet TAKE 1 TABLET EVERY DAY 90 tablet 3   Insulin NPH, Human,, Isophane, (NOVOLIN N FLEXPEN) 100 UNIT/ML Kiwkpen 36 units in am and 28-30 units at bedtime 60 mL 1   levofloxacin (LEVAQUIN) 500 MG tablet Take 1 tablet (500 mg total) by mouth daily. 6 tablet 0   levothyroxine (SYNTHROID) 50 MCG tablet TAKE 1 TABLET EVERY DAY BEFORE BREAKFAST 90 tablet 0   meloxicam (MOBIC) 15 MG tablet TAKE 1/2 TO 1 TABLET EVERY DAY AS NEEDED FOR PAIN 90 tablet 0   metFORMIN (GLUCOPHAGE) 1000 MG tablet Take 1 tablet (1,000 mg total) by mouth 2 (two) times daily. 180 tablet 3   nystatin (MYCOSTATIN/NYSTOP) powder Apply 1 Application topically daily as needed (yeast under belly). 60 g 2   pantoprazole (PROTONIX) 40 MG tablet TAKE 1 TABLET TWICE DAILY  180 tablet 3   potassium chloride (KLOR-CON M) 10 MEQ tablet Take 1 tablet (10 mEq total) by mouth daily. 90 tablet 2   rosuvastatin (CRESTOR) 20 MG tablet TAKE 1 TABLET AT BEDTIME 90 tablet 0   Tiotropium Bromide-Olodaterol (STIOLTO RESPIMAT) 2.5-2.5 MCG/ACT AERS Inhale 2 puffs into the lungs daily. 4 g 6   TRUE METRIX BLOOD GLUCOSE TEST test strip Use 2x a day 200 each 3   valsartan (DIOVAN) 80 MG tablet TAKE 1 TABLET (80 MG TOTAL) BY MOUTH DAILY. 90 tablet 3   spironolactone (ALDACTONE) 25 MG tablet Take 0.5 tablets (12.5 mg total) by mouth daily. 45 tablet 3   Tiotropium Bromide-Olodaterol (STIOLTO RESPIMAT) 2.5-2.5 MCG/ACT AERS Inhale 2 puffs into the lungs daily. (Patient not taking: Reported on 09/23/2022)     No facility-administered medications prior to visit.    Past Medical History:  Diagnosis Date   Arthritis    knee and shoulders   Colon polyps 09/05/2017   COPD  (chronic obstructive pulmonary disease) (HCC)    Diabetes mellitus without complication (HCC)    type II   Dyspnea    05/16/20 has had a cough for 2.5 years post Covid   Gastritis 09/05/2017   GERD (gastroesophageal reflux disease)    Hyperlipidemia    Hypertension    patient denies   Hypothyroidism    Panic attack    RUQ pain 03/09/2017   Per patient, she has had RUQ pain 4-5 years that has grown worse in the last few months.      Objective:     BP 114/60 (BP Location: Right Arm, Patient Position: Sitting, Cuff Size: Large)   Pulse 99   Temp (!) 100.9 F (38.3 C) (Oral)   Ht 5\' 6"  (1.676 m)   Wt 231 lb (104.8 kg)   SpO2 90%   BMI 37.28 kg/m   SpO2: 90 %  RA   Mod obese (by BMI) w/c bound wf appears acutely ill and very discouraged with response to every medication but stiolto "which is only thing that helps"  but hasn't taken today. Nebs make her sicker    HEENT : Oropharynx  wearing face mask         NECK :  without  apparent JVD/ palpable Nodes/TM    LUNGS: no acc muscle use,  Nl contour chest with coarse insp crackles in L base    CV:  RRR  no s3 or murmur or increase in P2, and no edema   ABD:  soft and nontender    MS:  ext warm without deformities Or obvious joint restrictions  calf tenderness, cyanosis or clubbing    SKIN: warm and dry without lesions    NEURO:  alert, approp, nl sensorium with  no motor or cerebellar deficits apparent.       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  Total time for H and P, chart review, counseling,  ) and generating customized AVS  unique to this ACUTE  office visit / same day charting = 40 min pt new to me  Sandrea Hughs, MD 09/23/2022

## 2022-09-23 ENCOUNTER — Encounter (HOSPITAL_COMMUNITY): Payer: Self-pay

## 2022-09-23 ENCOUNTER — Encounter: Payer: Self-pay | Admitting: Internal Medicine

## 2022-09-23 ENCOUNTER — Ambulatory Visit: Payer: Medicare HMO | Admitting: Internal Medicine

## 2022-09-23 ENCOUNTER — Ambulatory Visit (HOSPITAL_COMMUNITY): Payer: Medicare HMO

## 2022-09-23 ENCOUNTER — Other Ambulatory Visit: Payer: Self-pay

## 2022-09-23 ENCOUNTER — Emergency Department (HOSPITAL_COMMUNITY): Payer: Medicare HMO

## 2022-09-23 ENCOUNTER — Inpatient Hospital Stay (HOSPITAL_COMMUNITY)
Admission: EM | Admit: 2022-09-23 | Discharge: 2022-09-30 | DRG: 871 | Disposition: A | Discharge status: Home / Self Care | Payer: Medicare HMO | Attending: Internal Medicine | Admitting: Internal Medicine

## 2022-09-23 VITALS — BP 114/60 | HR 99 | Temp 100.9°F | Ht 66.0 in | Wt 231.0 lb

## 2022-09-23 DIAGNOSIS — J440 Chronic obstructive pulmonary disease with acute lower respiratory infection: Secondary | ICD-10-CM | POA: Diagnosis not present

## 2022-09-23 DIAGNOSIS — Z1152 Encounter for screening for COVID-19: Secondary | ICD-10-CM

## 2022-09-23 DIAGNOSIS — I2510 Atherosclerotic heart disease of native coronary artery without angina pectoris: Secondary | ICD-10-CM | POA: Diagnosis not present

## 2022-09-23 DIAGNOSIS — R6520 Severe sepsis without septic shock: Secondary | ICD-10-CM | POA: Diagnosis present

## 2022-09-23 DIAGNOSIS — E039 Hypothyroidism, unspecified: Secondary | ICD-10-CM | POA: Diagnosis present

## 2022-09-23 DIAGNOSIS — A4189 Other specified sepsis: Principal | ICD-10-CM | POA: Diagnosis present

## 2022-09-23 DIAGNOSIS — N1831 Chronic kidney disease, stage 3a: Secondary | ICD-10-CM | POA: Diagnosis present

## 2022-09-23 DIAGNOSIS — U071 COVID-19: Secondary | ICD-10-CM | POA: Diagnosis not present

## 2022-09-23 DIAGNOSIS — K7460 Unspecified cirrhosis of liver: Secondary | ICD-10-CM | POA: Diagnosis present

## 2022-09-23 DIAGNOSIS — Z794 Long term (current) use of insulin: Secondary | ICD-10-CM

## 2022-09-23 DIAGNOSIS — Z87891 Personal history of nicotine dependence: Secondary | ICD-10-CM

## 2022-09-23 DIAGNOSIS — R600 Localized edema: Secondary | ICD-10-CM

## 2022-09-23 DIAGNOSIS — Z8616 Personal history of COVID-19: Secondary | ICD-10-CM | POA: Diagnosis not present

## 2022-09-23 DIAGNOSIS — E876 Hypokalemia: Secondary | ICD-10-CM | POA: Diagnosis present

## 2022-09-23 DIAGNOSIS — J1282 Pneumonia due to coronavirus disease 2019: Secondary | ICD-10-CM | POA: Diagnosis present

## 2022-09-23 DIAGNOSIS — Z79899 Other long term (current) drug therapy: Secondary | ICD-10-CM

## 2022-09-23 DIAGNOSIS — E871 Hypo-osmolality and hyponatremia: Secondary | ICD-10-CM | POA: Diagnosis present

## 2022-09-23 DIAGNOSIS — R0902 Hypoxemia: Secondary | ICD-10-CM | POA: Diagnosis not present

## 2022-09-23 DIAGNOSIS — J984 Other disorders of lung: Secondary | ICD-10-CM | POA: Diagnosis not present

## 2022-09-23 DIAGNOSIS — J9601 Acute respiratory failure with hypoxia: Secondary | ICD-10-CM | POA: Diagnosis not present

## 2022-09-23 DIAGNOSIS — T380X5A Adverse effect of glucocorticoids and synthetic analogues, initial encounter: Secondary | ICD-10-CM | POA: Diagnosis present

## 2022-09-23 DIAGNOSIS — I129 Hypertensive chronic kidney disease with stage 1 through stage 4 chronic kidney disease, or unspecified chronic kidney disease: Secondary | ICD-10-CM | POA: Diagnosis present

## 2022-09-23 DIAGNOSIS — Z6837 Body mass index (BMI) 37.0-37.9, adult: Secondary | ICD-10-CM

## 2022-09-23 DIAGNOSIS — E1122 Type 2 diabetes mellitus with diabetic chronic kidney disease: Secondary | ICD-10-CM | POA: Diagnosis present

## 2022-09-23 DIAGNOSIS — E785 Hyperlipidemia, unspecified: Secondary | ICD-10-CM | POA: Diagnosis present

## 2022-09-23 DIAGNOSIS — R059 Cough, unspecified: Secondary | ICD-10-CM | POA: Diagnosis not present

## 2022-09-23 DIAGNOSIS — D61818 Other pancytopenia: Secondary | ICD-10-CM | POA: Diagnosis present

## 2022-09-23 DIAGNOSIS — Z86010 Personal history of colonic polyps: Secondary | ICD-10-CM

## 2022-09-23 DIAGNOSIS — E1165 Type 2 diabetes mellitus with hyperglycemia: Secondary | ICD-10-CM | POA: Diagnosis present

## 2022-09-23 DIAGNOSIS — J189 Pneumonia, unspecified organism: Secondary | ICD-10-CM | POA: Diagnosis not present

## 2022-09-23 DIAGNOSIS — K219 Gastro-esophageal reflux disease without esophagitis: Secondary | ICD-10-CM | POA: Diagnosis present

## 2022-09-23 DIAGNOSIS — F410 Panic disorder [episodic paroxysmal anxiety] without agoraphobia: Secondary | ICD-10-CM | POA: Diagnosis present

## 2022-09-23 DIAGNOSIS — Z833 Family history of diabetes mellitus: Secondary | ICD-10-CM

## 2022-09-23 DIAGNOSIS — R509 Fever, unspecified: Secondary | ICD-10-CM | POA: Diagnosis not present

## 2022-09-23 DIAGNOSIS — J849 Interstitial pulmonary disease, unspecified: Secondary | ICD-10-CM | POA: Diagnosis not present

## 2022-09-23 DIAGNOSIS — E669 Obesity, unspecified: Secondary | ICD-10-CM | POA: Diagnosis present

## 2022-09-23 DIAGNOSIS — Z79890 Hormone replacement therapy (postmenopausal): Secondary | ICD-10-CM

## 2022-09-23 DIAGNOSIS — Z7984 Long term (current) use of oral hypoglycemic drugs: Secondary | ICD-10-CM

## 2022-09-23 DIAGNOSIS — R053 Chronic cough: Secondary | ICD-10-CM | POA: Diagnosis present

## 2022-09-23 DIAGNOSIS — Z880 Allergy status to penicillin: Secondary | ICD-10-CM

## 2022-09-23 DIAGNOSIS — R918 Other nonspecific abnormal finding of lung field: Secondary | ICD-10-CM | POA: Diagnosis not present

## 2022-09-23 DIAGNOSIS — R0602 Shortness of breath: Secondary | ICD-10-CM | POA: Diagnosis not present

## 2022-09-23 DIAGNOSIS — G4733 Obstructive sleep apnea (adult) (pediatric): Secondary | ICD-10-CM | POA: Diagnosis present

## 2022-09-23 DIAGNOSIS — Z7982 Long term (current) use of aspirin: Secondary | ICD-10-CM

## 2022-09-23 LAB — COMPREHENSIVE METABOLIC PANEL
ALT: 28 U/L (ref 0–44)
AST: 45 U/L — ABNORMAL HIGH (ref 15–41)
Albumin: 3.3 g/dL — ABNORMAL LOW (ref 3.5–5.0)
Alkaline Phosphatase: 47 U/L (ref 38–126)
Anion gap: 12 (ref 5–15)
BUN: 14 mg/dL (ref 8–23)
CO2: 23 mmol/L (ref 22–32)
Calcium: 8.6 mg/dL — ABNORMAL LOW (ref 8.9–10.3)
Chloride: 94 mmol/L — ABNORMAL LOW (ref 98–111)
Creatinine, Ser: 1.25 mg/dL — ABNORMAL HIGH (ref 0.44–1.00)
GFR, Estimated: 45 mL/min — ABNORMAL LOW (ref >60)
Glucose, Bld: 218 mg/dL — ABNORMAL HIGH (ref 70–99)
Potassium: 3.3 mmol/L — ABNORMAL LOW (ref 3.5–5.1)
Sodium: 129 mmol/L — ABNORMAL LOW (ref 135–145)
Total Bilirubin: 0.8 mg/dL (ref 0.3–1.2)
Total Protein: 6.8 g/dL (ref 6.5–8.1)

## 2022-09-23 LAB — CBC WITH DIFFERENTIAL/PLATELET
Abs Immature Granulocytes: 0.03 10*3/uL (ref 0.00–0.07)
Basophils Absolute: 0 10*3/uL (ref 0.0–0.1)
Basophils Relative: 0 %
Eosinophils Absolute: 0 10*3/uL (ref 0.0–0.5)
Eosinophils Relative: 0 %
HCT: 31 % — ABNORMAL LOW (ref 36.0–46.0)
Hemoglobin: 10.1 g/dL — ABNORMAL LOW (ref 12.0–15.0)
Immature Granulocytes: 1 %
Lymphocytes Relative: 20 %
Lymphs Abs: 0.6 10*3/uL — ABNORMAL LOW (ref 0.7–4.0)
MCH: 25.1 pg — ABNORMAL LOW (ref 26.0–34.0)
MCHC: 32.6 g/dL (ref 30.0–36.0)
MCV: 76.9 fL — ABNORMAL LOW (ref 80.0–100.0)
Monocytes Absolute: 0.2 10*3/uL (ref 0.1–1.0)
Monocytes Relative: 8 %
Neutro Abs: 2.1 10*3/uL (ref 1.7–7.7)
Neutrophils Relative %: 71 %
Platelets: 136 10*3/uL — ABNORMAL LOW (ref 150–400)
RBC: 4.03 MIL/uL (ref 3.87–5.11)
RDW: 15.2 % (ref 11.5–15.5)
WBC: 3 10*3/uL — ABNORMAL LOW (ref 4.0–10.5)
nRBC: 0 % (ref 0.0–0.2)

## 2022-09-23 LAB — RESP PANEL BY RT-PCR (RSV, FLU A&B, COVID)  RVPGX2
Influenza A by PCR: NEGATIVE
Influenza B by PCR: NEGATIVE
Resp Syncytial Virus by PCR: NEGATIVE
SARS Coronavirus 2 by RT PCR: POSITIVE — AB

## 2022-09-23 LAB — HEMOGLOBIN A1C
Hgb A1c MFr Bld: 8.8 % — ABNORMAL HIGH (ref 4.8–5.6)
Mean Plasma Glucose: 205.86 mg/dL

## 2022-09-23 LAB — PHOSPHORUS: Phosphorus: 1.8 mg/dL — ABNORMAL LOW (ref 2.5–4.6)

## 2022-09-23 LAB — I-STAT CG4 LACTIC ACID, ED
Lactic Acid, Venous: 1.6 mmol/L (ref 0.5–1.9)
Lactic Acid, Venous: 1.5 mmol/L (ref 0.5–1.9)

## 2022-09-23 LAB — OSMOLALITY: Osmolality: 295 mosm/kg (ref 275–295)

## 2022-09-23 LAB — PROTIME-INR
INR: 1.2 (ref 0.8–1.2)
Prothrombin Time: 15.5 s — ABNORMAL HIGH (ref 11.4–15.2)

## 2022-09-23 LAB — GLUCOSE, CAPILLARY
Glucose-Capillary: 184 mg/dL — ABNORMAL HIGH (ref 70–99)
Glucose-Capillary: 292 mg/dL — ABNORMAL HIGH (ref 70–99)

## 2022-09-23 LAB — PROCALCITONIN: Procalcitonin: 0.15 ng/mL

## 2022-09-23 LAB — MAGNESIUM: Magnesium: 1.3 mg/dL — ABNORMAL LOW (ref 1.7–2.4)

## 2022-09-23 LAB — D-DIMER, QUANTITATIVE: D-Dimer, Quant: 0.54 ug{FEU}/mL — ABNORMAL HIGH (ref 0.00–0.50)

## 2022-09-23 MED ORDER — ASPIRIN 81 MG PO TBEC
81.0000 mg | DELAYED_RELEASE_TABLET | Freq: Every day | ORAL | Status: DC
  Administered 2022-09-23 – 2022-09-30 (×8): 81 mg via ORAL
  Filled 2022-09-23 (×8): qty 1

## 2022-09-23 MED ORDER — DEXAMETHASONE 4 MG PO TABS
6.0000 mg | ORAL_TABLET | Freq: Every day | ORAL | Status: DC
  Administered 2022-09-23 – 2022-09-30 (×8): 6 mg via ORAL
  Filled 2022-09-23 (×8): qty 2

## 2022-09-23 MED ORDER — HYDRALAZINE HCL 20 MG/ML IJ SOLN: 10.0000 mg | Freq: Four times a day (QID) | INTRAMUSCULAR | Status: DC | PRN

## 2022-09-23 MED ORDER — EZETIMIBE 10 MG PO TABS
10.0000 mg | ORAL_TABLET | Freq: Every day | ORAL | Status: DC
  Administered 2022-09-23 – 2022-09-30 (×8): 10 mg via ORAL
  Filled 2022-09-23 (×8): qty 1

## 2022-09-23 MED ORDER — IRBESARTAN 75 MG PO TABS
75.0000 mg | ORAL_TABLET | Freq: Every day | ORAL | Status: DC
  Administered 2022-09-24 – 2022-09-30 (×7): 75 mg via ORAL
  Filled 2022-09-23 (×8): qty 1

## 2022-09-23 MED ORDER — VITAMIN C 500 MG PO TABS
500.0000 mg | ORAL_TABLET | Freq: Every day | ORAL | Status: DC
  Administered 2022-09-23 – 2022-09-30 (×8): 500 mg via ORAL
  Filled 2022-09-23 (×8): qty 1

## 2022-09-23 MED ORDER — SODIUM CHLORIDE 0.9% FLUSH
3.0000 mL | Freq: Two times a day (BID) | INTRAVENOUS | Status: DC
  Administered 2022-09-23 – 2022-09-30 (×15): 3 mL via INTRAVENOUS

## 2022-09-23 MED ORDER — FLUTICASONE FUROATE-VILANTEROL 200-25 MCG/ACT IN AEPB
1.0000 | INHALATION_SPRAY | Freq: Every day | RESPIRATORY_TRACT | Status: DC
  Administered 2022-09-24 – 2022-09-30 (×7): 1 via RESPIRATORY_TRACT
  Filled 2022-09-23 (×3): qty 28

## 2022-09-23 MED ORDER — SODIUM CHLORIDE 0.9% FLUSH: 3.0000 mL | INTRAVENOUS | Status: DC | PRN

## 2022-09-23 MED ORDER — UMECLIDINIUM BROMIDE 62.5 MCG/ACT IN AEPB
1.0000 | INHALATION_SPRAY | Freq: Every day | RESPIRATORY_TRACT | Status: DC
  Administered 2022-09-24 – 2022-09-30 (×7): 1 via RESPIRATORY_TRACT
  Filled 2022-09-23: qty 7

## 2022-09-23 MED ORDER — MENTHOL 3 MG MT LOZG
1.0000 | LOZENGE | OROMUCOSAL | Status: DC | PRN
  Administered 2022-09-24: 3 mg via ORAL
  Filled 2022-09-23: qty 9

## 2022-09-23 MED ORDER — VITAMIN D 25 MCG (1000 UNIT) PO TABS
1000.0000 [IU] | ORAL_TABLET | Freq: Every day | ORAL | Status: DC
  Administered 2022-09-23 – 2022-09-30 (×8): 1000 [IU] via ORAL
  Filled 2022-09-23 (×10): qty 1

## 2022-09-23 MED ORDER — BISACODYL 5 MG PO TBEC
5.0000 mg | DELAYED_RELEASE_TABLET | Freq: Every day | ORAL | Status: DC | PRN
  Administered 2022-09-29: 5 mg via ORAL
  Filled 2022-09-23 (×2): qty 1

## 2022-09-23 MED ORDER — FUROSEMIDE 10 MG/ML IJ SOLN
20.0000 mg | Freq: Two times a day (BID) | INTRAMUSCULAR | Status: DC
  Administered 2022-09-23 – 2022-09-27 (×8): 20 mg via INTRAVENOUS
  Filled 2022-09-23 (×8): qty 2

## 2022-09-23 MED ORDER — SODIUM CHLORIDE 0.9% FLUSH
3.0000 mL | Freq: Two times a day (BID) | INTRAVENOUS | Status: DC
  Administered 2022-09-23 – 2022-09-30 (×13): 3 mL via INTRAVENOUS

## 2022-09-23 MED ORDER — ENOXAPARIN SODIUM 40 MG/0.4ML IJ SOSY
40.0000 mg | PREFILLED_SYRINGE | INTRAMUSCULAR | Status: DC
  Administered 2022-09-23 – 2022-09-29 (×7): 40 mg via SUBCUTANEOUS
  Filled 2022-09-23 (×7): qty 0.4

## 2022-09-23 MED ORDER — ALBUTEROL SULFATE (2.5 MG/3ML) 0.083% IN NEBU
3.0000 mL | INHALATION_SOLUTION | Freq: Two times a day (BID) | RESPIRATORY_TRACT | Status: DC
  Administered 2022-09-24 – 2022-09-26 (×6): 3 mL via RESPIRATORY_TRACT
  Filled 2022-09-23 (×6): qty 3

## 2022-09-23 MED ORDER — BENZONATATE 100 MG PO CAPS
100.0000 mg | ORAL_CAPSULE | Freq: Three times a day (TID) | ORAL | Status: DC | PRN
  Administered 2022-09-24 (×2): 100 mg via ORAL
  Filled 2022-09-23 (×2): qty 1

## 2022-09-23 MED ORDER — INSULIN ASPART 100 UNIT/ML IJ SOLN
0.0000 [IU] | Freq: Three times a day (TID) | INTRAMUSCULAR | Status: DC
  Administered 2022-09-23: 3 [IU] via SUBCUTANEOUS
  Administered 2022-09-24: 20 [IU] via SUBCUTANEOUS
  Administered 2022-09-24: 15 [IU] via SUBCUTANEOUS

## 2022-09-23 MED ORDER — SODIUM CHLORIDE 0.9 % IV SOLN
1.0000 g | Freq: Once | INTRAVENOUS | Status: AC
  Administered 2022-09-23: 1 g via INTRAVENOUS
  Filled 2022-09-23: qty 10

## 2022-09-23 MED ORDER — SODIUM CHLORIDE 0.9 % IV SOLN: 250.0000 mL | INTRAVENOUS | Status: DC | PRN

## 2022-09-23 MED ORDER — SODIUM CHLORIDE 0.9 % IV SOLN
100.0000 mg | Freq: Every day | INTRAVENOUS | Status: AC
  Administered 2022-09-24 – 2022-09-25 (×2): 100 mg via INTRAVENOUS
  Filled 2022-09-23 (×2): qty 20

## 2022-09-23 MED ORDER — ADULT MULTIVITAMIN W/MINERALS CH
1.0000 | ORAL_TABLET | Freq: Every day | ORAL | Status: DC
  Administered 2022-09-23 – 2022-09-30 (×8): 1 via ORAL
  Filled 2022-09-23 (×8): qty 1

## 2022-09-23 MED ORDER — ACETAMINOPHEN 325 MG PO TABS
650.0000 mg | ORAL_TABLET | Freq: Four times a day (QID) | ORAL | Status: DC | PRN
  Administered 2022-09-23 – 2022-09-28 (×5): 650 mg via ORAL
  Filled 2022-09-23 (×5): qty 2

## 2022-09-23 MED ORDER — SODIUM PHOSPHATES 45 MMOLE/15ML IV SOLN
30.0000 mmol | Freq: Once | INTRAVENOUS | Status: AC
  Administered 2022-09-23: 30 mmol via INTRAVENOUS
  Filled 2022-09-23: qty 10

## 2022-09-23 MED ORDER — PANTOPRAZOLE SODIUM 40 MG PO TBEC
40.0000 mg | DELAYED_RELEASE_TABLET | Freq: Two times a day (BID) | ORAL | Status: DC
  Administered 2022-09-23 – 2022-09-30 (×14): 40 mg via ORAL
  Filled 2022-09-23 (×14): qty 1

## 2022-09-23 MED ORDER — POLYETHYLENE GLYCOL 3350 17 G PO PACK
17.0000 g | PACK | Freq: Every day | ORAL | Status: DC | PRN
  Administered 2022-09-29: 17 g via ORAL
  Filled 2022-09-23: qty 1

## 2022-09-23 MED ORDER — IOHEXOL 350 MG/ML SOLN
125.0000 mL | Freq: Once | INTRAVENOUS | Status: AC | PRN
  Administered 2022-09-23: 125 mL via INTRAVENOUS

## 2022-09-23 MED ORDER — ALBUTEROL SULFATE (2.5 MG/3ML) 0.083% IN NEBU
3.0000 mL | INHALATION_SOLUTION | Freq: Four times a day (QID) | RESPIRATORY_TRACT | Status: DC
  Administered 2022-09-23: 3 mL via RESPIRATORY_TRACT
  Filled 2022-09-23: qty 3

## 2022-09-23 MED ORDER — ACETAMINOPHEN 650 MG RE SUPP: 650.0000 mg | Freq: Four times a day (QID) | RECTAL | Status: DC | PRN

## 2022-09-23 MED ORDER — LEVOTHYROXINE SODIUM 50 MCG PO TABS
50.0000 ug | ORAL_TABLET | Freq: Every day | ORAL | Status: DC
  Administered 2022-09-24 – 2022-09-30 (×7): 50 ug via ORAL
  Filled 2022-09-23 (×7): qty 1

## 2022-09-23 MED ORDER — SODIUM CHLORIDE 0.9 % IV SOLN
200.0000 mg | Freq: Once | INTRAVENOUS | Status: AC
  Administered 2022-09-23: 200 mg via INTRAVENOUS
  Filled 2022-09-23: qty 40

## 2022-09-23 MED ORDER — GUAIFENESIN ER 600 MG PO TB12
600.0000 mg | ORAL_TABLET | Freq: Two times a day (BID) | ORAL | Status: DC
  Administered 2022-09-23 – 2022-09-30 (×15): 600 mg via ORAL
  Filled 2022-09-23 (×15): qty 1

## 2022-09-23 MED ORDER — SODIUM CHLORIDE 0.9 % IV SOLN
500.0000 mg | Freq: Once | INTRAVENOUS | Status: AC
  Administered 2022-09-23: 500 mg via INTRAVENOUS
  Filled 2022-09-23: qty 5

## 2022-09-23 MED ORDER — MAGNESIUM SULFATE 4 GM/100ML IV SOLN
4.0000 g | Freq: Once | INTRAVENOUS | Status: AC
  Administered 2022-09-23: 4 g via INTRAVENOUS
  Filled 2022-09-23: qty 100

## 2022-09-23 MED ORDER — GUAIFENESIN-DM 100-10 MG/5ML PO SYRP
5.0000 mL | ORAL_SOLUTION | ORAL | Status: DC | PRN
  Administered 2022-09-23 – 2022-09-24 (×2): 5 mL via ORAL
  Filled 2022-09-23 (×2): qty 5

## 2022-09-23 MED ORDER — POTASSIUM CHLORIDE CRYS ER 20 MEQ PO TBCR
40.0000 meq | EXTENDED_RELEASE_TABLET | Freq: Once | ORAL | Status: AC
  Administered 2022-09-23: 40 meq via ORAL
  Filled 2022-09-23: qty 2

## 2022-09-23 NOTE — ED Triage Notes (Signed)
Reports went last week for chest xray and was told she had PNA and lung mass. Patient reports she has been sick x 3 weeks.  Complains of SOB and cough with productive sputum brown in color.  Reports fevers.

## 2022-09-23 NOTE — ED Notes (Signed)
ED TO INPATIENT HANDOFF REPORT  ED Nurse Name and Phone #: Gustavo Lah, rn [phone]  S Name/Age/Gender Tamara [name] 77 y.o. female Room/Bed: 034C/034C  Code Status   Code Status: Full Code  Home/SNF/Other Home Patient oriented to: self, place, time, and situation Is this baseline? Yes   Triage Complete: Triage complete  Chief Complaint Pneumonia due to COVID-19 virus [U07.1, J12.82]  Triage Note Reports went last week for chest xray and was told she had PNA and lung mass. Patient reports she has been sick x 3 weeks.  Complains of SOB and cough with productive sputum brown in color.  Reports fevers.     Allergies Allergies  Allergen Reactions   Penicillins Hives    Reaction: 1965    Level of Care/Admitting Diagnosis ED Disposition     ED Disposition  Admit   Condition  --   Comment  Hospital Area: [facility] [[id number]]  Level of Care: Med-Surg [16]  May admit patient to [facility] or [facility] if equivalent level of care is available:: Yes  Covid Evaluation: Confirmed COVID Positive  Diagnosis: Pneumonia due to COVID-19 virus [[id number]]  Admitting Physician: Gillis Santa [[id number]]  Attending Physician: Gillis Santa [[id number]]  Certification:: I certify this patient will need inpatient services for at least 2 midnights  Expected Medical Readiness: 09/25/2022          B Medical/Surgery History Past Medical History:  Diagnosis Date   Arthritis    knee and shoulders   Colon polyps 09/05/2017   COPD (chronic obstructive pulmonary disease) (HCC)    Diabetes mellitus without complication (HCC)    type II   Dyspnea    05/16/20 has had a cough for 2.5 years post Covid   Gastritis 09/05/2017   GERD (gastroesophageal reflux disease)    Hyperlipidemia    Hypertension    patient denies   Hypothyroidism    Panic attack    RUQ pain 03/09/2017   Per patient, she has had RUQ pain 4-5 years that has grown worse in the last few months.    Past Surgical History:  Procedure Laterality Date   BREAST BIOPSY Left 2008   CORE W/CLIP - NEG   BRONCHIAL BIOPSY  05/19/2020   Procedure: BRONCHIAL BIOPSIES;  Surgeon: Leslye Peer, MD;  Location: [facility] ENDOSCOPY;  Service: Pulmonary;;   BRONCHIAL BRUSHINGS  05/19/2020   Procedure: BRONCHIAL BRUSHINGS;  Surgeon: Leslye Peer, MD;  Location: [facility] ENDOSCOPY;  Service: Pulmonary;;   BRONCHIAL NEEDLE ASPIRATION BIOPSY  05/19/2020   Procedure: BRONCHIAL NEEDLE ASPIRATION BIOPSIES;  Surgeon: Leslye Peer, MD;  Location: [facility] ENDOSCOPY;  Service: Pulmonary;;   BRONCHIAL WASHINGS  05/19/2020   Procedure: BRONCHIAL WASHINGS;  Surgeon: Leslye Peer, MD;  Location: [facility] ENDOSCOPY;  Service: Pulmonary;;   COLONOSCOPY WITH PROPOFOL N/A 02/08/2017   Procedure: COLONOSCOPY WITH PROPOFOL;  Surgeon: Christena Deem, MD;  Location: [facility] ENDOSCOPY;  Service: Endoscopy;  Laterality: N/A;   COLONOSCOPY WITH PROPOFOL N/A 05/05/2022   Procedure: COLONOSCOPY WITH PROPOFOL;  Surgeon: Toney Reil, MD;  Location: [facility] ENDOSCOPY;  Service: Gastroenterology;  Laterality: N/A;   ESOPHAGOGASTRODUODENOSCOPY (EGD) WITH PROPOFOL N/A 02/08/2017   Procedure: ESOPHAGOGASTRODUODENOSCOPY (EGD) WITH PROPOFOL;  Surgeon: Christena Deem, MD;  Location: [facility] ENDOSCOPY;  Service: Endoscopy;  Laterality: N/A;   TUBAL LIGATION     VIDEO BRONCHOSCOPY WITH ENDOBRONCHIAL NAVIGATION N/A 05/19/2020   Procedure: VIDEO BRONCHOSCOPY WITH ENDOBRONCHIAL NAVIGATION;  Surgeon: Leslye Peer, MD;  Location: [facility]  ENDOSCOPY;  Service: Pulmonary;  Laterality: N/A;     A IV Location/Drains/Wounds Patient Lines/Drains/Airways Status     Active Line/Drains/Airways     Name Placement date Placement time Site Days   Peripheral IV 09/23/22 20 G Anterior;Right Forearm 09/23/22  1335  Forearm  less than 1            Intake/Output Last 24 hours No intake or output data in the 24 hours ending 09/23/22 1457  Labs/Imaging Results for  orders placed or performed during the hospital encounter of 09/23/22 (from the past 48 hour(s))  Comprehensive metabolic panel     Status: Abnormal   Collection Time: 09/23/22 11:59 AM  Result Value Ref Range   Sodium 129 (L) 135 - 145 mmol/L   Potassium 3.3 (L) 3.5 - 5.1 mmol/L   Chloride 94 (L) 98 - 111 mmol/L   CO2 23 22 - 32 mmol/L   Glucose, Bld 218 (H) 70 - 99 mg/dL    Comment: Glucose reference range applies only to samples taken after fasting for at least 8 hours.   BUN 14 8 - 23 mg/dL   Creatinine, Ser 3.24 (H) 0.44 - 1.00 mg/dL   Calcium 8.6 (L) 8.9 - 10.3 mg/dL   Total Protein 6.8 6.5 - 8.1 g/dL   Albumin 3.3 (L) 3.5 - 5.0 g/dL   AST 45 (H) 15 - 41 U/L   ALT 28 0 - 44 U/L   Alkaline Phosphatase 47 38 - 126 U/L   Total Bilirubin 0.8 0.3 - 1.2 mg/dL   GFR, Estimated 45 (L) >60 mL/min    Comment: (NOTE) Calculated using the CKD-EPI Creatinine Equation (2021)    Anion gap 12 5 - 15    Comment: Performed at [facility], [address] [place], [place] [address]  CBC with Differential     Status: Abnormal   Collection Time: 09/23/22 11:59 AM  Result Value Ref Range   WBC 3.0 (L) 4.0 - 10.5 K/uL   RBC 4.03 3.87 - 5.11 MIL/uL   Hemoglobin 10.1 (L) 12.0 - 15.0 g/dL   HCT 72.5 (L) 36.6 - 44.0 %   MCV 76.9 (L) 80.0 - 100.0 fL   MCH 25.1 (L) 26.0 - 34.0 pg   MCHC 32.6 30.0 - 36.0 g/dL   RDW 34.7 42.5 - 95.6 %   Platelets 136 (L) 150 - 400 K/uL   nRBC 0.0 0.0 - 0.2 %   Neutrophils Relative % 71 %   Neutro Abs 2.1 1.7 - 7.7 K/uL   Lymphocytes Relative 20 %   Lymphs Abs 0.6 (L) 0.7 - 4.0 K/uL   Monocytes Relative 8 %   Monocytes Absolute 0.2 0.1 - 1.0 K/uL   Eosinophils Relative 0 %   Eosinophils Absolute 0.0 0.0 - 0.5 K/uL   Basophils Relative 0 %   Basophils Absolute 0.0 0.0 - 0.1 K/uL   WBC Morphology MORPHOLOGY UNREMARKABLE    RBC Morphology See Note    Smear Review Reviewed    Immature Granulocytes 1 %   Abs Immature Granulocytes 0.03 0.00 - 0.07 K/uL     Comment: Performed at [facility], [address], [place], [place] [address]  Protime-INR     Status: Abnormal   Collection Time: 09/23/22 11:59 AM  Result Value Ref Range   Prothrombin Time 15.5 (H) 11.4 - 15.2 seconds   INR 1.2 0.8 - 1.2    Comment: (NOTE) INR goal varies based on device and disease  states. Performed at [facility], [address] [place], [place] [address]   Resp panel by RT-PCR (RSV, Flu A&B, Covid) Anterior Nasal Swab     Status: Abnormal   Collection Time: 09/23/22 11:59 AM   Specimen: Anterior Nasal Swab  Result Value Ref Range   SARS Coronavirus 2 by RT PCR POSITIVE (A) NEGATIVE   Influenza A by PCR NEGATIVE NEGATIVE   Influenza B by PCR NEGATIVE NEGATIVE    Comment: (NOTE) The Xpert Xpress SARS-CoV-2/FLU/RSV plus assay is intended as an aid in the diagnosis of influenza from Nasopharyngeal swab specimens and should not be used as a sole basis for treatment. Nasal washings and aspirates are unacceptable for Xpert Xpress SARS-CoV-2/FLU/RSV testing.  Fact Sheet for Patients: BloggerCourse.com  Fact Sheet for Healthcare Providers: SeriousBroker.it  This test is not yet approved or cleared by the [place] FDA and has been authorized for detection and/or diagnosis of SARS-CoV-2 by FDA under an Emergency Use Authorization (EUA). This EUA will remain in effect (meaning this test can be used) for the duration of the COVID-19 declaration under Section 564(b)(1) of the Act, 21 U.S.C. section 360bbb-3(b)(1), unless the authorization is terminated or revoked.     Resp Syncytial Virus by PCR NEGATIVE NEGATIVE    Comment: (NOTE) Fact Sheet for Patients: BloggerCourse.com  Fact Sheet for Healthcare Providers: SeriousBroker.it  This test is not yet approved or cleared by the [place] FDA and has been authorized for detection and/or  diagnosis of SARS-CoV-2 by FDA under an Emergency Use Authorization (EUA). This EUA will remain in effect (meaning this test can be used) for the duration of the COVID-19 declaration under Section 564(b)(1) of the Act, 21 U.S.C. section 360bbb-3(b)(1), unless the authorization is terminated or revoked.  Performed at [facility], [address], [place], [place] [address]   I-Stat Lactic Acid, ED     Status: None   Collection Time: 09/23/22 12:18 PM  Result Value Ref Range   Lactic Acid, Venous 1.6 0.5 - 1.9 mmol/L  I-Stat Lactic Acid, ED     Status: None   Collection Time: 09/23/22  2:31 PM  Result Value Ref Range   Lactic Acid, Venous 1.5 0.5 - 1.9 mmol/L   DG Chest 2 View  Result Date: 09/23/2022 CLINICAL DATA:  Suspected Sepsis.  Shortness of breath.  Cough. EXAM: CHEST - 2 VIEW COMPARISON:  09/14/2022. FINDINGS: Redemonstration of increased interstitial markings throughout bilateral lungs. Heterogeneous opacity overlying the left lung lower lobe is essentially unchanged. However, there are new patchy opacities overlying the right lateral costophrenic angle which may represent superimposed pneumonitis versus atelectasis. Bilateral lateral costophrenic angles are clear. Stable cardio-mediastinal silhouette. No acute osseous abnormalities. The soft tissues are within normal limits. IMPRESSION: 1. Chronic interstitial lung disease. 2. New patchy opacities overlying the right lateral costophrenic angle may represent superimposed pneumonitis versus atelectasis. 3. Persistent left lung lower lobe opacities. Electronically Signed   By: Jules Schick M.D.   On: 09/23/2022 14:01    Pending Labs Unresulted Labs (From admission, onward)     Start     Ordered   09/24/22 0500  Basic metabolic panel  Daily,   R      09/23/22 1450   09/24/22 0500  CBC  Daily,   R      09/23/22 1450   09/24/22 0500  Magnesium  Daily,   R      09/23/22 1450   09/24/22 0500  Phosphorus  Daily,  R       09/23/22 1450   09/24/22 0500  Hepatic function panel  Daily,   R      09/23/22 1451   09/23/22 1457  Hemoglobin A1c  Add-on,   AD       Comments: To assess prior glycemic control    09/23/22 1456   09/23/22 1451  Magnesium  Add-on,   AD       Question:  Specimen collection method  Answer:  Lab=Lab collect   09/23/22 1450   09/23/22 1451  Phosphorus  Add-on,   AD       Question:  Specimen collection method  Answer:  Lab=Lab collect   09/23/22 1450   09/23/22 1449  C-reactive protein  Add-on,   AD        09/23/22 1450   09/23/22 1449  D-dimer, quantitative  Add-on,   AD        09/23/22 1450   09/23/22 1448  Osmolality  Add-on,   AD        09/23/22 1450   09/23/22 1448  Procalcitonin  Add-on,   AD       References:    Procalcitonin Lower Respiratory Tract Infection AND Sepsis Procalcitonin Algorithm   09/23/22 1450   09/23/22 1159  Culture, blood (Routine x 2)  BLOOD CULTURE X 2,   R (with STAT occurrences)      09/23/22 1158   09/23/22 1159  Urinalysis, w/ Reflex to Culture (Infection Suspected) -Urine, Clean Catch  Once,   URGENT       Question:  Specimen Source  Answer:  Urine, Clean Catch   09/23/22 1158            Vitals/Pain Today's Vitals   09/23/22 1149 09/23/22 1155 09/23/22 1156 09/23/22 1400  BP: (!) 114/59   (!) 118/55  Pulse: 93   85  Resp: (!) 22   (!) 26  Temp: 100.2 F (37.9 C)     TempSrc: Oral     SpO2: 90%   97%  Weight:   104.8 kg   Height:   5\' 6"  (1.676 m)   PainSc:  10-Worst pain ever      Isolation Precautions Airborne and Contact precautions  Medications Medications  cefTRIAXone (ROCEPHIN) 1 g in sodium chloride 0.9 % 100 mL IVPB (has no administration in time range)  azithromycin (ZITHROMAX) 500 mg in sodium chloride 0.9 % 250 mL IVPB (has no administration in time range)  enoxaparin (LOVENOX) injection 40 mg (has no administration in time range)  sodium chloride flush (NS) 0.9 % injection 3 mL (has no administration in time range)   sodium chloride flush (NS) 0.9 % injection 3 mL (has no administration in time range)  sodium chloride flush (NS) 0.9 % injection 3 mL (has no administration in time range)  0.9 %  sodium chloride infusion (has no administration in time range)  acetaminophen (TYLENOL) tablet 650 mg (has no administration in time range)    Or  acetaminophen (TYLENOL) suppository 650 mg (has no administration in time range)  polyethylene glycol (MIRALAX / GLYCOLAX) packet 17 g (has no administration in time range)  bisacodyl (DULCOLAX) EC tablet 5 mg (has no administration in time range)  guaiFENesin (MUCINEX) 12 hr tablet 600 mg (has no administration in time range)  hydrALAZINE (APRESOLINE) injection 10 mg (has no administration in time range)  potassium chloride SA (KLOR-CON M) CR tablet 40 mEq (has no administration in time range)  dexamethasone (DECADRON)  tablet 6 mg (has no administration in time range)  insulin aspart (novoLOG) injection 0-15 Units (has no administration in time range)  iohexol (OMNIPAQUE) 350 MG/ML injection 125 mL (125 mLs Intravenous Contrast Given 09/23/22 1450)    Mobility non-ambulatory, normally walks at home, hasn't since two ago     Focused Assessments Cardiac Assessment Handoff:    Lab Results  Component Value Date   CKTOTAL 71 04/11/2018   No results found for: "DDIMER" Does the Patient currently have chest pain? No   , Pulmonary Assessment Handoff:  Lung sounds: L Breath Sounds: Rhonchi R Breath Sounds: Rhonchi O2 Device: Room Air      R Recommendations: See Admitting Provider Note  Report given to:   Additional Notes:

## 2022-09-23 NOTE — Patient Instructions (Signed)
Go to [facility]

## 2022-09-23 NOTE — ED Provider Notes (Signed)
[place] EMERGENCY DEPARTMENT AT [facility] Provider Note   CSN: [id number] Arrival date & time: 09/23/22  1143     History  Chief Complaint  Patient presents with   Shortness of Breath    Tamara [name] is a 77 y.o. 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  The patient presents to the ED for evaluation of shortness of breath, fevers.  The patient states that for the last 2 weeks she has had fevers, shortness of breath, nausea, leg swelling, lightheadedness, dizziness, weakness.  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  She reports that the antibiotics are not working.  She states that she was at her pulmonology office this morning and she was sent to the ED for evaluation due to her worsening shortness of breath and hypoxia.  She denies home oxygen use but arrives with an oxygen saturation of 90% on room air.  She has a temperature of 100.2.  She denies chest pain, palpitations, abdominal pain.  She is endorsing nausea and vomiting.   Shortness of Breath Associated symptoms: cough, fever and vomiting   Associated symptoms: no chest pain        Home Medications Prior to Admission medications   Medication Sig Start Date End Date Taking? Authorizing Provider  albuterol (PROVENTIL) (2.5 MG/3ML) 0.083% nebulizer solution Take 3 mLs (2.5 mg total) by nebulization every 6 (six) hours as needed for wheezing or shortness of breath. 03/01/22 03/01/23 Yes Margarita Mail, DO  albuterol (VENTOLIN HFA) 108 (90 Base) MCG/ACT inhaler Inhale 2 puffs into the lungs every 6 (six) hours as needed for wheezing or shortness of breath. 09/17/20  Yes Leslye Peer, MD  Ascorbic Acid (VITAMIN C) 1000 MG tablet Take 1,000 mg by mouth daily.   Yes [provider]  Cholecalciferol  (VITAMIN D) 50 MCG (2000 UT) CAPS Take 1,000 Units by mouth daily.   Yes [provider]  Insulin NPH, Human,, Isophane, (NOVOLIN N FLEXPEN) 100 UNIT/ML Kiwkpen 36 units in am and 28-30 units at bedtime 07/05/22  Yes Carlus Pavlov, MD  levothyroxine (SYNTHROID) 50 MCG tablet TAKE 1 TABLET EVERY DAY BEFORE BREAKFAST 07/26/22  Yes Margarita Mail, DO  meloxicam (MOBIC) 15 MG tablet TAKE 1/2 TO 1 TABLET EVERY DAY AS NEEDED FOR PAIN 09/09/22  Yes Margarita Mail, DO  metFORMIN (GLUCOPHAGE) 1000 MG tablet Take 1 tablet (1,000 mg total) by mouth 2 (two) times daily. 06/10/22  Yes Carlus Pavlov, MD  nystatin (MYCOSTATIN/NYSTOP) powder Apply 1 Application topically daily as needed (yeast under belly). 03/01/22  Yes Margarita Mail, DO  pantoprazole (PROTONIX) 40 MG tablet TAKE 1 TABLET TWICE DAILY 12/14/21  Yes Byrum, Les Pou, MD  potassium chloride (KLOR-CON M) 10 MEQ tablet Take 1 tablet (10 mEq total) by mouth daily. 05/18/22  Yes Meriam Sprague, MD  rosuvastatin (CRESTOR) 20 MG tablet TAKE 1 TABLET AT BEDTIME 07/26/22  Yes Margarita Mail, DO  Tiotropium Bromide-Olodaterol (STIOLTO RESPIMAT) 2.5-2.5 MCG/ACT AERS Inhale 2 puffs into the lungs daily. 09/08/22  Yes Leslye Peer, MD  valsartan (DIOVAN) 80 MG tablet TAKE 1 TABLET (80 MG TOTAL) BY MOUTH DAILY. 05/12/22  Yes Berniece Salines, FNP  Accu-Chek Softclix Lancets lancets TEST BLOOD SUGAR TWO TIMES DAILY 07/28/22   Margarita Mail, DO  Alcohol Swabs (DROPSAFE ALCOHOL PREP) 70 % PADS USE  AS DIRECTED TWICE DAILY WHEN TESTING BLOOD SUGAR 06/27/22   Carlus Pavlov, MD  aspirin EC 81 MG tablet Take 1 tablet (81 mg total) by mouth daily. Swallow whole. 05/18/22   Meriam Sprague, MD  dapagliflozin propanediol (FARXIGA) 5 MG TABS tablet Take 1 tablet (5 mg total) by mouth daily before breakfast. 11/27/21   Carlus Pavlov, MD  DROPLET PEN NEEDLES 32G X 4 MM MISC 1 Device by Other route daily. 06/10/22   Carlus Pavlov, MD   ezetimibe (ZETIA) 10 MG tablet Take 1 tablet (10 mg total) by mouth daily. 02/16/22   Flossie Dibble, NP  furosemide (LASIX) 40 MG tablet Take 1 tablet (40 mg total) by mouth daily. 05/31/22   Danelle Berry, PA-C  gabapentin (NEURONTIN) 300 MG capsule Take 300 mg by mouth as needed. 10/16/21   [provider]  hydrochlorothiazide (HYDRODIURIL) 25 MG tablet TAKE 1 TABLET EVERY DAY 05/31/22   Margarita Mail, DO  levofloxacin (LEVAQUIN) 500 MG tablet Take 1 tablet (500 mg total) by mouth daily. 09/14/22   Kalman Shan, MD  spironolactone (ALDACTONE) 25 MG tablet Take 0.5 tablets (12.5 mg total) by mouth daily. 05/18/22 08/16/22  Meriam Sprague, MD  TRUE METRIX BLOOD GLUCOSE TEST test strip Use 2x a day 06/10/22   Carlus Pavlov, MD      Allergies    Penicillins    Review of Systems   Review of Systems  Constitutional:  Positive for fever.  Respiratory:  Positive for cough and shortness of breath.   Cardiovascular:  Positive for leg swelling. Negative for chest pain.  Gastrointestinal:  Positive for nausea and vomiting.  Neurological:  Positive for dizziness, weakness and light-headedness.  All other systems reviewed and are negative.   Physical Exam Updated Vital Signs BP (!) 118/55   Pulse 85   Temp 100.2 F (37.9 C) (Oral)   Resp (!) 26   Ht 5\' 6"  (1.676 m)   Wt 104.8 kg   SpO2 97%   BMI 37.28 kg/m  Physical Exam Vitals and nursing note reviewed.  Constitutional:      General: She is not in acute distress.    Appearance: Normal appearance. She is not ill-appearing, toxic-appearing or diaphoretic.  HENT:     Head: Normocephalic and atraumatic.     Nose: Nose normal.     Mouth/Throat:     Mouth: Mucous membranes are moist.     Pharynx: Oropharynx is clear.  Eyes:     Extraocular Movements: Extraocular movements intact.     Conjunctiva/sclera: Conjunctivae normal.     Pupils: Pupils are equal, round, and reactive to light.  Cardiovascular:     Rate  and Rhythm: Normal rate and regular rhythm.  Pulmonary:     Effort: Pulmonary effort is normal.     Breath sounds: Normal breath sounds. No wheezing.  Abdominal:     General: Abdomen is flat. Bowel sounds are normal.     Palpations: Abdomen is soft.     Tenderness: There is no abdominal tenderness.  Musculoskeletal:     Cervical back: Normal range of motion and neck supple. No tenderness.     Right lower leg: Edema present.     Left lower leg: Edema present.     Comments: 1+ nonpitting edema to bilateral lower extremities  Skin:    General: Skin is warm and dry.     Capillary Refill: Capillary refill takes less than 2 seconds.  Neurological:     Mental Status:  She is alert and oriented to person, place, and time.     ED Results / Procedures / Treatments   Labs (all labs ordered are listed, but only abnormal results are displayed) Labs Reviewed  RESP PANEL BY RT-PCR (RSV, FLU A&B, COVID)  RVPGX2 - Abnormal; Notable for the following components:      Result Value   SARS Coronavirus 2 by RT PCR POSITIVE (*)    All other components within normal limits  COMPREHENSIVE METABOLIC PANEL - Abnormal; Notable for the following components:   Sodium 129 (*)    Potassium 3.3 (*)    Chloride 94 (*)    Glucose, Bld 218 (*)    Creatinine, Ser 1.25 (*)    Calcium 8.6 (*)    Albumin 3.3 (*)    AST 45 (*)    GFR, Estimated 45 (*)    All other components within normal limits  CBC WITH DIFFERENTIAL/PLATELET - Abnormal; Notable for the following components:   WBC 3.0 (*)    Hemoglobin 10.1 (*)    HCT 31.0 (*)    MCV 76.9 (*)    MCH 25.1 (*)    Platelets 136 (*)    Lymphs Abs 0.6 (*)    All other components within normal limits  PROTIME-INR - Abnormal; Notable for the following components:   Prothrombin Time 15.5 (*)    All other components within normal limits  CULTURE, BLOOD (ROUTINE X 2)  CULTURE, BLOOD (ROUTINE X 2)  URINALYSIS, W/ REFLEX TO CULTURE (INFECTION SUSPECTED)   OSMOLALITY  PROCALCITONIN  C-REACTIVE PROTEIN  D-DIMER, QUANTITATIVE  MAGNESIUM  PHOSPHORUS  HEMOGLOBIN A1C  I-STAT CG4 LACTIC ACID, ED  I-STAT CG4 LACTIC ACID, ED    EKG None  Radiology DG Chest 2 View  Result Date: 09/23/2022 CLINICAL DATA:  Suspected Sepsis.  Shortness of breath.  Cough. EXAM: CHEST - 2 VIEW COMPARISON:  09/14/2022. FINDINGS: Redemonstration of increased interstitial markings throughout bilateral lungs. Heterogeneous opacity overlying the left lung lower lobe is essentially unchanged. However, there are new patchy opacities overlying the right lateral costophrenic angle which may represent superimposed pneumonitis versus atelectasis. Bilateral lateral costophrenic angles are clear. Stable cardio-mediastinal silhouette. No acute osseous abnormalities. The soft tissues are within normal limits. IMPRESSION: 1. Chronic interstitial lung disease. 2. New patchy opacities overlying the right lateral costophrenic angle may represent superimposed pneumonitis versus atelectasis. 3. Persistent left lung lower lobe opacities. Electronically Signed   By: Jules Schick M.D.   On: 09/23/2022 14:01    Procedures .Critical Care  Performed by: Al Decant, PA-C Authorized by: Al Decant, PA-C   Critical care provider statement:    Critical care time (minutes):  85   Critical care time was exclusive of:  Separately billable procedures and treating other patients   Critical care was necessary to treat or prevent imminent or life-threatening deterioration of the following conditions:  Respiratory failure   Critical care was time spent personally by me on the following activities:  Ordering and performing treatments and interventions, ordering and review of radiographic studies, ordering and review of laboratory studies, pulse oximetry, re-evaluation of patient's condition, review of old charts, obtaining history from patient or surrogate, interpretation of cardiac  output measurements, examination of patient, evaluation of patient's response to treatment, discussions with primary provider, discussions with consultants, development of treatment plan with patient or surrogate and blood draw for specimens   I assumed direction of critical care for this patient from another provider in my specialty:  no     Care discussed with: admitting provider      Medications Ordered in ED Medications  cefTRIAXone (ROCEPHIN) 1 g in sodium chloride 0.9 % 100 mL IVPB (1 g Intravenous New Bag/Given 09/23/22 1454)  azithromycin (ZITHROMAX) 500 mg in sodium chloride 0.9 % 250 mL IVPB (has no administration in time range)  enoxaparin (LOVENOX) injection 40 mg (has no administration in time range)  sodium chloride flush (NS) 0.9 % injection 3 mL (has no administration in time range)  sodium chloride flush (NS) 0.9 % injection 3 mL (has no administration in time range)  sodium chloride flush (NS) 0.9 % injection 3 mL (has no administration in time range)  0.9 %  sodium chloride infusion (has no administration in time range)  acetaminophen (TYLENOL) tablet 650 mg (has no administration in time range)    Or  acetaminophen (TYLENOL) suppository 650 mg (has no administration in time range)  polyethylene glycol (MIRALAX / GLYCOLAX) packet 17 g (has no administration in time range)  bisacodyl (DULCOLAX) EC tablet 5 mg (has no administration in time range)  guaiFENesin (MUCINEX) 12 hr tablet 600 mg (has no administration in time range)  hydrALAZINE (APRESOLINE) injection 10 mg (has no administration in time range)  potassium chloride SA (KLOR-CON M) CR tablet 40 mEq (has no administration in time range)  dexamethasone (DECADRON) tablet 6 mg (has no administration in time range)  insulin aspart (novoLOG) injection 0-15 Units (has no administration in time range)  multivitamin with minerals tablet 1 tablet (has no administration in time range)  iohexol (OMNIPAQUE) 350 MG/ML injection 125  mL (125 mLs Intravenous Contrast Given 09/23/22 1450)    ED Course/ Medical Decision Making/ A&P   = Medical Decision Making Amount and/or Complexity of Data Reviewed Labs: ordered. Radiology: ordered.   77 year old female presents to ED for evaluation.  Please see HPI for further details.  On examination the patient has a temperature of 100.2 orally, nontachycardic.  Her lung sounds are distant, she is hypoxic on room air.  Abdomen is soft and compressible throughout.  Patient does have 1+ nonpitting edema to lower extremities bilaterally.  Neurological examination at baseline.  Patient CBC shows no leukocytosis, hemoglobin 10.1 baseline.  CMP with sodium 129, potassium 3.3, creatinine 1.25 which is patient baseline, GFR 45, anion gap 12.  Patient viral panel has positive COVID.  Patient PT-INR has elevated prothrombin time.  Lactic acid at 1.5, not elevated.  Chest x-ray shows chronic interstitial lung disease, new patchy opacities over the right lateral costophrenic angle which may represent superimposed pneumonitis or atelectasis as well as persistent left lung lower lobe opacities.  Have started patient on CAP treatment at this time to include azithromycin, Rocephin.  Will proceed with CTA to get better characterization of lungs.  At this time, patient requires admission due to new oxygen status.  Have admitted patient to Dr. Lucianne Muss who has agreed to admit the patient.  Patient amenable to plan.   Final Clinical Impression(s) / ED Diagnoses Final diagnoses:  COVID-19  Hypoxia  Community acquired pneumonia of right lower lobe of lung    Rx / DC Orders ED Discharge Orders     None         Clent Ridges 09/23/22 1514    Alvira Monday, MD 09/24/22 [phone]

## 2022-09-23 NOTE — H&P (Signed)
[facility] History and Physical   Patient: Tamara [name] UXL:[id number]   PCP: Margarita Mail, DO DOB: 1945-01-29   DOA: 09/23/2022   DOS: 09/23/2022   DOS: the patient was seen and examined on 09/23/2022  Patient coming from: The patient is coming from Home  Chief Complaint: Shortness of breath  HPI: Tamara [name] is a 77 y.o. female with Past medical history of HTN, HLD, IDDM T2, COPD, hypothyroid, OSA, GERD, as reviewed from EMR, presented at [facility] [place], ED with complaining of fever, worsening of shortness of breath and productive cough for 4 to 5 days.  Patient is having difficulty breathing and symptomatic for past 2 to 3 weeks but for the last 4 to 5 days she started running fever and chest congestion and runny nose, shortness of breath getting worse.  Patient was seen by pulmonologist and recently treated with antibiotics but no improvement.  Patient was sent to ED for further workup and management.   ED Course: Vital signs febrile Tmax 100.9, HR 93, RR 22, BP 114/59, hypoxic sat 90% on room air Hyponatremia sodium 129, hypokalemia potassium 3.3, chloride 94, glucose 218, Creatinine 1.25, calcium 8.6 CBC, leukopenia WBC 3.0, anemia hemoglobin 10.1, thrombocytopenia platelet 136 CTA chest negative for PE. Extensive heterogeneous opacities throughout bilateral lungs with asymmetric more involvement of the left lung lower lobe, compatible with multilobar pneumonia.  Review of Systems: as mentioned in the history of present illness.  All other systems reviewed and are negative.  Past Medical History:  Diagnosis Date   Arthritis    knee and shoulders   Colon polyps 09/05/2017   COPD (chronic obstructive pulmonary disease) (HCC)    Diabetes mellitus without complication (HCC)    type II   Dyspnea    05/16/20 has had a cough for 2.5 years post Covid   Gastritis 09/05/2017   GERD (gastroesophageal reflux disease)    Hyperlipidemia    Hypertension    patient denies    Hypothyroidism    Panic attack    RUQ pain 03/09/2017   Per patient, she has had RUQ pain 4-5 years that has grown worse in the last few months.   Past Surgical History:  Procedure Laterality Date   BREAST BIOPSY Left 2008   CORE W/CLIP - NEG   BRONCHIAL BIOPSY  05/19/2020   Procedure: BRONCHIAL BIOPSIES;  Surgeon: Leslye Peer, MD;  Location: [facility] ENDOSCOPY;  Service: Pulmonary;;   BRONCHIAL BRUSHINGS  05/19/2020   Procedure: BRONCHIAL BRUSHINGS;  Surgeon: Leslye Peer, MD;  Location: [facility] ENDOSCOPY;  Service: Pulmonary;;   BRONCHIAL NEEDLE ASPIRATION BIOPSY  05/19/2020   Procedure: BRONCHIAL NEEDLE ASPIRATION BIOPSIES;  Surgeon: Leslye Peer, MD;  Location: [facility] ENDOSCOPY;  Service: Pulmonary;;   BRONCHIAL WASHINGS  05/19/2020   Procedure: BRONCHIAL WASHINGS;  Surgeon: Leslye Peer, MD;  Location: [facility] ENDOSCOPY;  Service: Pulmonary;;   COLONOSCOPY WITH PROPOFOL N/A 02/08/2017   Procedure: COLONOSCOPY WITH PROPOFOL;  Surgeon: Christena Deem, MD;  Location: [facility] ENDOSCOPY;  Service: Endoscopy;  Laterality: N/A;   COLONOSCOPY WITH PROPOFOL N/A 05/05/2022   Procedure: COLONOSCOPY WITH PROPOFOL;  Surgeon: Toney Reil, MD;  Location: [facility] ENDOSCOPY;  Service: Gastroenterology;  Laterality: N/A;   ESOPHAGOGASTRODUODENOSCOPY (EGD) WITH PROPOFOL N/A 02/08/2017   Procedure: ESOPHAGOGASTRODUODENOSCOPY (EGD) WITH PROPOFOL;  Surgeon: Christena Deem, MD;  Location: [facility] ENDOSCOPY;  Service: Endoscopy;  Laterality: N/A;   TUBAL LIGATION     VIDEO BRONCHOSCOPY WITH ENDOBRONCHIAL NAVIGATION N/A 05/19/2020   Procedure:  VIDEO BRONCHOSCOPY WITH ENDOBRONCHIAL NAVIGATION;  Surgeon: Leslye Peer, MD;  Location: [facility] ENDOSCOPY;  Service: Pulmonary;  Laterality: N/A;   Social History:  reports that she quit smoking about 18 years ago. Her smoking use included cigarettes. She started smoking about 58 years ago. She has a 40 pack-year smoking history. She has never used smokeless tobacco. She reports  that she does not drink alcohol and does not use drugs.  Allergies  Allergen Reactions   Penicillins Hives    Reaction: 14    Family history reviewed and not pertinent Family History  Problem Relation Age of Onset   Diabetes Maternal Grandmother    Breast cancer Neg Hx      Prior to Admission medications   Medication Sig Start Date End Date Taking? Authorizing Provider  Insulin NPH, Human,, Isophane, (NOVOLIN N FLEXPEN) 100 UNIT/ML Kiwkpen 36 units in am and 28-30 units at bedtime 07/05/22  Yes Carlus Pavlov, MD  levothyroxine (SYNTHROID) 50 MCG tablet TAKE 1 TABLET EVERY DAY BEFORE BREAKFAST 07/26/22  Yes Margarita Mail, DO  metFORMIN (GLUCOPHAGE) 1000 MG tablet Take 1 tablet (1,000 mg total) by mouth 2 (two) times daily. 06/10/22  Yes Carlus Pavlov, MD  nystatin (MYCOSTATIN/NYSTOP) powder Apply 1 Application topically daily as needed (yeast under belly). 03/01/22  Yes Margarita Mail, DO  pantoprazole (PROTONIX) 40 MG tablet TAKE 1 TABLET TWICE DAILY 12/14/21  Yes Byrum, Les Pou, MD  potassium chloride (KLOR-CON M) 10 MEQ tablet Take 1 tablet (10 mEq total) by mouth daily. 05/18/22  Yes Meriam Sprague, MD  rosuvastatin (CRESTOR) 20 MG tablet TAKE 1 TABLET AT BEDTIME 07/26/22  Yes Margarita Mail, DO  Accu-Chek Softclix Lancets lancets TEST BLOOD SUGAR TWO TIMES DAILY 07/28/22   Margarita Mail, DO  albuterol (PROVENTIL) (2.5 MG/3ML) 0.083% nebulizer solution Take 3 mLs (2.5 mg total) by nebulization every 6 (six) hours as needed for wheezing or shortness of breath. 03/01/22 03/01/23  Margarita Mail, DO  albuterol (VENTOLIN HFA) 108 (90 Base) MCG/ACT inhaler Inhale 2 puffs into the lungs every 6 (six) hours as needed for wheezing or shortness of breath. 09/17/20   Leslye Peer, MD  Alcohol Swabs (DROPSAFE ALCOHOL PREP) 70 % PADS USE AS DIRECTED TWICE DAILY WHEN TESTING BLOOD SUGAR 06/27/22   Carlus Pavlov, MD  Ascorbic Acid (VITAMIN C) 1000 MG tablet Take  1,000 mg by mouth daily.    [provider]  aspirin EC 81 MG tablet Take 1 tablet (81 mg total) by mouth daily. Swallow whole. 05/18/22   Meriam Sprague, MD  Cholecalciferol (VITAMIN D) 50 MCG (2000 UT) CAPS Take 1,000 Units by mouth daily.    [provider]  dapagliflozin propanediol (FARXIGA) 5 MG TABS tablet Take 1 tablet (5 mg total) by mouth daily before breakfast. 11/27/21   Carlus Pavlov, MD  DROPLET PEN NEEDLES 32G X 4 MM MISC 1 Device by Other route daily. 06/10/22   Carlus Pavlov, MD  ezetimibe (ZETIA) 10 MG tablet Take 1 tablet (10 mg total) by mouth daily. 02/16/22   Flossie Dibble, NP  furosemide (LASIX) 40 MG tablet Take 1 tablet (40 mg total) by mouth daily. 05/31/22   Danelle Berry, PA-C  gabapentin (NEURONTIN) 300 MG capsule Take by mouth. 10/16/21   [provider]  hydrochlorothiazide (HYDRODIURIL) 25 MG tablet TAKE 1 TABLET EVERY DAY 05/31/22   Margarita Mail, DO  levofloxacin (LEVAQUIN) 500 MG tablet Take 1 tablet (500 mg total) by mouth daily. 09/14/22   Ramaswamy,  Carmin Muskrat, MD  meloxicam (MOBIC) 15 MG tablet TAKE 1/2 TO 1 TABLET EVERY DAY AS NEEDED FOR PAIN 09/09/22   Margarita Mail, DO  spironolactone (ALDACTONE) 25 MG tablet Take 0.5 tablets (12.5 mg total) by mouth daily. 05/18/22 08/16/22  Meriam Sprague, MD  Tiotropium Bromide-Olodaterol (STIOLTO RESPIMAT) 2.5-2.5 MCG/ACT AERS Inhale 2 puffs into the lungs daily. 09/08/22   Leslye Peer, MD  TRUE METRIX BLOOD GLUCOSE TEST test strip Use 2x a day 06/10/22   Carlus Pavlov, MD  valsartan (DIOVAN) 80 MG tablet TAKE 1 TABLET (80 MG TOTAL) BY MOUTH DAILY. 05/12/22   Berniece Salines, FNP    Physical Exam: Vitals:   09/23/22 1149 09/23/22 1156 09/23/22 1400  BP: (!) 114/59  (!) 118/55  Pulse: 93  85  Resp: (!) 22  (!) 26  Temp: 100.2 F (37.9 C)    TempSrc: Oral    SpO2: 90%  97%  Weight:  104.8 kg   Height:  5\' 6"  (1.676 m)     General: alert and oriented to time,  place, and person. Appear in mild distress, affect appropriate Eyes: PERRLA, Conjunctiva normal ENT: Oral Mucosa Clear, moist  Neck: no JVD, no Abnormal Mass Or lumps Cardiovascular: S1 and S2 Present, no Murmur, peripheral pulses symmetrical Respiratory: good respiratory effort, Bilateral Air entry equal and Decreased, no signs of accessory muscle use, b/l Crackles, no wheezes Abdomen: Bowel Sound present, Soft and no tenderness, no hernia Skin: no rashes  Extremities: no Pedal edema, no calf tenderness Neurologic: without any new focal findings Gait not checked due to patient safety concerns  Data Reviewed: I have personally reviewed and interpreted labs, imaging as discussed below.  CBC: Recent Labs  Lab 09/23/22 1159  WBC 3.0*  NEUTROABS 2.1  HGB 10.1*  HCT 31.0*  MCV 76.9*  PLT 136*   Basic Metabolic Panel: Recent Labs  Lab 09/23/22 1159  NA 129*  K 3.3*  CL 94*  CO2 23  GLUCOSE 218*  BUN 14  CREATININE 1.25*  CALCIUM 8.6*   GFR: Estimated Creatinine Clearance: 47.6 mL/min (A) (by C-G formula based on SCr of 1.25 mg/dL (H)). Liver Function Tests: Recent Labs  Lab 09/23/22 1159  AST 45*  ALT 28  ALKPHOS 47  BILITOT 0.8  PROT 6.8  ALBUMIN 3.3*   No results for input(s): "LIPASE", "AMYLASE" in the last 168 hours. No results for input(s): "AMMONIA" in the last 168 hours. Coagulation Profile: Recent Labs  Lab 09/23/22 1159  INR 1.2   Cardiac Enzymes: No results for input(s): "CKTOTAL", "CKMB", "CKMBINDEX", "TROPONINI" in the last 168 hours. BNP (last 3 results) No results for input(s): "PROBNP" in the last 8760 hours. HbA1C: No results for input(s): "HGBA1C" in the last 72 hours. CBG: No results for input(s): "GLUCAP" in the last 168 hours. Lipid Profile: No results for input(s): "CHOL", "HDL", "LDLCALC", "TRIG", "CHOLHDL", "LDLDIRECT" in the last 72 hours. Thyroid Function Tests: No results for input(s): "TSH", "T4TOTAL", "FREET4", "T3FREE",  "THYROIDAB" in the last 72 hours. Anemia Panel: No results for input(s): "VITAMINB12", "FOLATE", "FERRITIN", "TIBC", "IRON", "RETICCTPCT" in the last 72 hours. Urine analysis: No results found for: "COLORURINE", "APPEARANCEUR", "LABSPEC", "PHURINE", "GLUCOSEU", "HGBUR", "BILIRUBINUR", "KETONESUR", "PROTEINUR", "UROBILINOGEN", "NITRITE", "LEUKOCYTESUR"  Radiological Exams on Admission: DG Chest 2 View  Result Date: 09/23/2022 CLINICAL DATA:  Suspected Sepsis.  Shortness of breath.  Cough. EXAM: CHEST - 2 VIEW COMPARISON:  09/14/2022. FINDINGS: Redemonstration of increased interstitial markings throughout bilateral lungs. Heterogeneous opacity overlying the left  lung lower lobe is essentially unchanged. However, there are new patchy opacities overlying the right lateral costophrenic angle which may represent superimposed pneumonitis versus atelectasis. Bilateral lateral costophrenic angles are clear. Stable cardio-mediastinal silhouette. No acute osseous abnormalities. The soft tissues are within normal limits. IMPRESSION: 1. Chronic interstitial lung disease. 2. New patchy opacities overlying the right lateral costophrenic angle may represent superimposed pneumonitis versus atelectasis. 3. Persistent left lung lower lobe opacities. Electronically Signed   By: Jules Schick M.D.   On: 09/23/2022 14:01     I reviewed all nursing notes, pharmacy notes, vitals, pertinent old records.  Assessment/Plan Principal Problem:   Pneumonia due to COVID-19 virus  #Severe sepsis secondary to COVID-pneumonia Sepsis criteria, hypoxia, fever, leukopenia, tachypnea and pneumonia Continue supplemental O2 admission and gradually wean off Started remdesivir, pharmacy consulted. Started Decadron 6 mg p.o. daily for 10 days, watch for hypoglycemia PPI for GI prophylaxis Started Breo Ellipta inhaler, Incruse inhaler and albuterol inhaler Started Mucinex 600 mg p.o. twice daily, Robitussin DM as needed Less likely  bacterial infection, procalcitonin negative.  Patient received ceftriaxone azithromycin in the ED.  They have more antibiotics. Follow blood cultures   Hypokalemia, potassium repleted. Hypophosphatemia, Phos repleted. Hypomagnesemia, mag repleted. Hyponatremia, monitor sodium level, check serum osmolarity Monitor electrolytes and replete as needed.  Pancytopenia, could be due to viral infection.   Monitor CBC daily   IDDM T2 Held home regimen for now Started NovoLog sliding scale Monitor CBG and continue diabetic diet   HTN, HLD Resumed home meds aspirin, statin, Zetia, irbesartan Diuretics changed to Lasix 20 mg every 12 hourly Monitor BP and titrate medications accordingly Monitor urine output and renal functions  Hypothyroid, continued Synthroid    Nutrition: Carb modified diet DVT Prophylaxis: Subcutaneous Lovenox  Advance goals of care discussion: Full code   Consults:   Family Communication: family was present at bedside, at the time of interview.  Opportunity was given to ask question and all questions were answered satisfactorily.  Disposition: Admitted as inpatient, med-surge unit. Likely to be discharged home, in 2-3 days.  I have discussed plan of care as described above with RN and patient/family.  Severity of Illness: The appropriate patient status for this patient is INPATIENT. Inpatient status is judged to be reasonable and necessary in order to provide the required intensity of service to ensure the patient's safety. The patient's presenting symptoms, physical exam findings, and initial radiographic and laboratory data in the context of their chronic comorbidities is felt to place them at high risk for further clinical deterioration. Furthermore, it is not anticipated that the patient will be medically stable for discharge from the hospital within 2 midnights of admission.   * I certify that at the point of admission it is my clinical judgment that the  patient will require inpatient hospital care spanning beyond 2 midnights from the point of admission due to high intensity of service, high risk for further deterioration and high frequency of surveillance required.*   Author: Gillis Santa, MD [facility] 09/23/2022 3:07 PM   To reach On-call, see care teams to locate the attending and reach out to them via [email or web address]. If 7PM-7AM, please contact night-coverage If you still have difficulty reaching the attending provider, please page the [facility] (Director on Call) for [facility] on amion for assistance.

## 2022-09-23 NOTE — Assessment & Plan Note (Addendum)
Failure of outpt rx including levaquin and now threatened sepsis/ dehydration > to ER (spoke to Triage/ Thayer Ohm at [facility])          Each maintenance medication was reviewed in detail including emphasizing most importantly the difference between maintenance and prns and under what circumstances the prns are to be triggered using an action plan format where appropriate.  Total time for H and P, chart review, counseling,  ) and generating customized AVS unique to this ACUTE  office visit / same day charting = 40 min pt new to me

## 2022-09-24 DIAGNOSIS — U071 COVID-19: Secondary | ICD-10-CM | POA: Diagnosis not present

## 2022-09-24 DIAGNOSIS — R0902 Hypoxemia: Secondary | ICD-10-CM

## 2022-09-24 DIAGNOSIS — J189 Pneumonia, unspecified organism: Secondary | ICD-10-CM

## 2022-09-24 DIAGNOSIS — J1282 Pneumonia due to coronavirus disease 2019: Secondary | ICD-10-CM | POA: Diagnosis not present

## 2022-09-24 LAB — BASIC METABOLIC PANEL
Anion gap: 12 (ref 5–15)
BUN: 17 mg/dL (ref 8–23)
CO2: 22 mmol/L (ref 22–32)
Calcium: 8.3 mg/dL — ABNORMAL LOW (ref 8.9–10.3)
Chloride: 95 mmol/L — ABNORMAL LOW (ref 98–111)
Creatinine, Ser: 1.21 mg/dL — ABNORMAL HIGH (ref 0.44–1.00)
GFR, Estimated: 47 mL/min — ABNORMAL LOW (ref >60)
Glucose, Bld: 438 mg/dL — ABNORMAL HIGH (ref 70–99)
Potassium: 3.9 mmol/L (ref 3.5–5.1)
Sodium: 129 mmol/L — ABNORMAL LOW (ref 135–145)

## 2022-09-24 LAB — URINALYSIS, W/ REFLEX TO CULTURE (INFECTION SUSPECTED)
Bacteria, UA: NONE SEEN
Bilirubin Urine: NEGATIVE
Glucose, UA: >=500 mg/dL — AB
Hgb urine dipstick: NEGATIVE
Ketones, ur: NEGATIVE mg/dL
Leukocytes,Ua: NEGATIVE
Nitrite: NEGATIVE
Protein, ur: NEGATIVE mg/dL
Specific Gravity, Urine: 1.027 (ref 1.005–1.030)
pH: 5 (ref 5.0–8.0)

## 2022-09-24 LAB — GLUCOSE, CAPILLARY
Glucose-Capillary: 445 mg/dL — ABNORMAL HIGH (ref 70–99)
Glucose-Capillary: 511 mg/dL (ref 70–99)
Glucose-Capillary: 372 mg/dL — ABNORMAL HIGH (ref 70–99)
Glucose-Capillary: 291 mg/dL — ABNORMAL HIGH (ref 70–99)

## 2022-09-24 LAB — HEPATIC FUNCTION PANEL
ALT: 24 U/L (ref 0–44)
AST: 33 U/L (ref 15–41)
Albumin: 2.6 g/dL — ABNORMAL LOW (ref 3.5–5.0)
Alkaline Phosphatase: 40 U/L (ref 38–126)
Bilirubin, Direct: 0.1 mg/dL (ref 0.0–0.2)
Indirect Bilirubin: 0.4 mg/dL (ref 0.3–0.9)
Total Bilirubin: 0.5 mg/dL (ref 0.3–1.2)
Total Protein: 5.5 g/dL — ABNORMAL LOW (ref 6.5–8.1)

## 2022-09-24 LAB — PROCALCITONIN: Procalcitonin: 0.14 ng/mL

## 2022-09-24 LAB — CBC
HCT: 27.1 % — ABNORMAL LOW (ref 36.0–46.0)
Hemoglobin: 8.8 g/dL — ABNORMAL LOW (ref 12.0–15.0)
MCH: 24.9 pg — ABNORMAL LOW (ref 26.0–34.0)
MCHC: 32.5 g/dL (ref 30.0–36.0)
MCV: 76.8 fL — ABNORMAL LOW (ref 80.0–100.0)
Platelets: 107 10*3/uL — ABNORMAL LOW (ref 150–400)
RBC: 3.53 MIL/uL — ABNORMAL LOW (ref 3.87–5.11)
RDW: 15.2 % (ref 11.5–15.5)
WBC: 1.5 10*3/uL — ABNORMAL LOW (ref 4.0–10.5)
nRBC: 0 % (ref 0.0–0.2)

## 2022-09-24 LAB — PHOSPHORUS: Phosphorus: 5.5 mg/dL — ABNORMAL HIGH (ref 2.5–4.6)

## 2022-09-24 LAB — MAGNESIUM: Magnesium: 2.7 mg/dL — ABNORMAL HIGH (ref 1.7–2.4)

## 2022-09-24 MED ORDER — INSULIN GLARGINE-YFGN 100 UNIT/ML ~~LOC~~ SOLN: 10.0000 [IU] | Freq: Once | SUBCUTANEOUS | Status: DC

## 2022-09-24 MED ORDER — SODIUM CHLORIDE 0.9 % IV SOLN
500.0000 mg | INTRAVENOUS | Status: DC
  Administered 2022-09-24 – 2022-09-27 (×4): 500 mg via INTRAVENOUS
  Filled 2022-09-24 (×4): qty 5

## 2022-09-24 MED ORDER — INSULIN GLARGINE-YFGN 100 UNIT/ML ~~LOC~~ SOLN
22.0000 [IU] | Freq: Every day | SUBCUTANEOUS | Status: DC
  Administered 2022-09-24: 22 [IU] via SUBCUTANEOUS
  Filled 2022-09-24: qty 0.22

## 2022-09-24 MED ORDER — INSULIN ASPART 100 UNIT/ML IJ SOLN
0.0000 [IU] | Freq: Three times a day (TID) | INTRAMUSCULAR | Status: DC
  Administered 2022-09-24 – 2022-09-25 (×2): 20 [IU] via SUBCUTANEOUS
  Administered 2022-09-25: 7 [IU] via SUBCUTANEOUS
  Administered 2022-09-25: 19 [IU] via SUBCUTANEOUS
  Administered 2022-09-26: 20 [IU] via SUBCUTANEOUS
  Administered 2022-09-26: 11 [IU] via SUBCUTANEOUS
  Administered 2022-09-26 – 2022-09-27 (×2): 7 [IU] via SUBCUTANEOUS
  Administered 2022-09-27: 15 [IU] via SUBCUTANEOUS
  Administered 2022-09-27 – 2022-09-28 (×2): 11 [IU] via SUBCUTANEOUS
  Administered 2022-09-28: 3 [IU] via SUBCUTANEOUS
  Administered 2022-09-28: 7 [IU] via SUBCUTANEOUS
  Administered 2022-09-29: 11 [IU] via SUBCUTANEOUS
  Administered 2022-09-29: 4 [IU] via SUBCUTANEOUS
  Administered 2022-09-29: 15 [IU] via SUBCUTANEOUS
  Administered 2022-09-30: 13 [IU] via SUBCUTANEOUS

## 2022-09-24 MED ORDER — INSULIN ASPART 100 UNIT/ML IJ SOLN
6.0000 [IU] | Freq: Once | INTRAMUSCULAR | Status: AC
  Administered 2022-09-24: 6 [IU] via SUBCUTANEOUS

## 2022-09-24 MED ORDER — SODIUM CHLORIDE 0.9 % IV SOLN
2.0000 g | INTRAVENOUS | Status: AC
  Administered 2022-09-24 – 2022-09-28 (×5): 2 g via INTRAVENOUS
  Filled 2022-09-24 (×5): qty 20

## 2022-09-24 MED ORDER — INSULIN GLARGINE-YFGN 100 UNIT/ML ~~LOC~~ SOLN
25.0000 [IU] | Freq: Every day | SUBCUTANEOUS | Status: DC
  Administered 2022-09-25: 25 [IU] via SUBCUTANEOUS
  Filled 2022-09-24: qty 0.25

## 2022-09-24 MED ORDER — DAPAGLIFLOZIN PROPANEDIOL 5 MG PO TABS
5.0000 mg | ORAL_TABLET | Freq: Every day | ORAL | Status: DC
  Administered 2022-09-25 – 2022-09-30 (×4): 5 mg via ORAL
  Filled 2022-09-24 (×7): qty 1

## 2022-09-24 MED ORDER — INSULIN GLARGINE-YFGN 100 UNIT/ML ~~LOC~~ SOLN: 30.0000 [IU] | Freq: Every day | SUBCUTANEOUS | Status: DC

## 2022-09-24 MED ORDER — INSULIN ASPART 100 UNIT/ML IJ SOLN
4.0000 [IU] | Freq: Three times a day (TID) | INTRAMUSCULAR | Status: DC
  Administered 2022-09-24 – 2022-09-25 (×5): 4 [IU] via SUBCUTANEOUS

## 2022-09-24 NOTE — Plan of Care (Signed)
  Problem: Education: Goal: Ability to describe self-care measures that may prevent or decrease complications (Diabetes Survival Skills Education) will improve Outcome: Progressing Goal: Individualized Educational Video(s) Outcome: Progressing   Problem: Coping: Goal: Ability to adjust to condition or change in health will improve Outcome: Progressing   Problem: Fluid Volume: Goal: Ability to maintain a balanced intake and output will improve Outcome: Progressing   Problem: Health Behavior/Discharge Planning: Goal: Ability to identify and utilize available resources and services will improve Outcome: Progressing Goal: Ability to manage health-related needs will improve Outcome: Progressing   Problem: Metabolic: Goal: Ability to maintain appropriate glucose levels will improve Outcome: Progressing   Problem: Nutritional: Goal: Maintenance of adequate nutrition will improve Outcome: Progressing Goal: Progress toward achieving an optimal weight will improve Outcome: Progressing   Problem: Skin Integrity: Goal: Risk for impaired skin integrity will decrease Outcome: Progressing   Problem: Tissue Perfusion: Goal: Adequacy of tissue perfusion will improve Outcome: Progressing   Problem: Education: Goal: Knowledge of risk factors and measures for prevention of condition will improve Outcome: Progressing   Problem: Coping: Goal: Psychosocial and spiritual needs will be supported Outcome: Progressing   Problem: Respiratory: Goal: Will maintain a patent airway Outcome: Progressing Goal: Complications related to the disease process, condition or treatment will be avoided or minimized Outcome: Progressing   Problem: Education: Goal: Knowledge of General Education information will improve Description: Including pain rating scale, medication(s)/side effects and non-pharmacologic comfort measures Outcome: Progressing   Problem: Health Behavior/Discharge Planning: Goal:  Ability to manage health-related needs will improve Outcome: Progressing   Problem: Clinical Measurements: Goal: Ability to maintain clinical measurements within normal limits will improve Outcome: Progressing Goal: Will remain free from infection Outcome: Progressing Goal: Diagnostic test results will improve Outcome: Progressing Goal: Respiratory complications will improve Outcome: Progressing Goal: Cardiovascular complication will be avoided Outcome: Progressing   Problem: Activity: Goal: Risk for activity intolerance will decrease Outcome: Progressing   Problem: Nutrition: Goal: Adequate nutrition will be maintained Outcome: Progressing   Problem: Coping: Goal: Level of anxiety will decrease Outcome: Progressing   Problem: Elimination: Goal: Will not experience complications related to bowel motility Outcome: Progressing Goal: Will not experience complications related to urinary retention Outcome: Progressing   Problem: Pain Managment: Goal: General experience of comfort will improve Outcome: Progressing   Problem: Safety: Goal: Ability to remain free from injury will improve Outcome: Progressing   Problem: Skin Integrity: Goal: Risk for impaired skin integrity will decrease Outcome: Progressing   

## 2022-09-24 NOTE — Consult Note (Signed)
NAME:  Tamara [name], MRN:  [id number], DOB:  [birth year], LOS: 1 ADMISSION DATE:  09/23/2022, CONSULTATION DATE: 09/24/2022 REFERRING MD: Dr. Lucianne Muss, CHIEF COMPLAINT: Acute hypoxemia  History of Present Illness:  Tamara [name] is 80, known to me from the outpatient office for history of former tobacco, COPD with chronic cough and abnormal CT scan of the chest.  Also with a history of diabetes, hypertension, hyperlipidemia, GERD.  She had a rounded cavitary infiltrate on CT chest dating back to 04/16/2020.  This did not resolve after a month, antibiotic treatment, so she underwent bronchoscopy on 05/19/2020 that showed atypical cells but nondiagnostic for malignancy.  PET 06/25/2020 showed no hypermetabolism, consistent with possible rounded atelectasis.  Continued to be stable on subsequent CT scans of the chest January and July 2023, 01/25/2022.  Most recent CT chest 08/02/2022 showed the same cystic rounded area, question slightly enlarged, as well as a new masslike consolidation in the superior segment of the left lower lobe 4.5 x 5.4 cm.  Based on this a PET scan was ordered, pending on 9/16.   Most recently she has been seen in our office on 8/27 and then again on 9/5 for increased cough, dark sputum without hemoptysis.  Treated 8/27 with prednisone and levofloxacin.  Sent to the ED on 9/5 for persistent symptoms.  Admitted and found to be COVID-19 positive, flu negative.  CT-PA shows the known left lower lobe opacities as well as bilateral heterogeneous mixed density opacities suggestive of acute inflammatory process.  She is requiring 3 L/min, usually does not have an oxygen requirement.    Pertinent  Medical History   Past Medical History:  Diagnosis Date   Arthritis    knee and shoulders   Colon polyps 09/05/2017   COPD (chronic obstructive pulmonary disease) (HCC)    Diabetes mellitus without complication (HCC)    type II   Dyspnea    05/16/20 has had a cough for 2.5 years post Covid    Gastritis 09/05/2017   GERD (gastroesophageal reflux disease)    Hyperlipidemia    Hypertension    patient denies   Hypothyroidism    Panic attack    RUQ pain 03/09/2017   Per patient, she has had RUQ pain 4-5 years that has grown worse in the last few months.     Significant Hospital Events: Including procedures, antibiotic start and stop dates in addition to other pertinent events     Interim History / Subjective:      Objective   Blood pressure (!) 105/53, pulse 79, temperature 97.6 F (36.4 C), temperature source Oral, resp. rate 19, height 5\' 6"  (1.676 m), weight 104.8 kg, SpO2 93%.        Intake/Output Summary (Last 24 hours) at 09/24/2022 1332 Last data filed at 09/24/2022 0600 Gross per 24 hour  Intake 862.66 ml  Output 100 ml  Net 762.66 ml   Filed Weights   09/23/22 1156  Weight: 104.8 kg    Examination: General: Obese woman, laying in bed, comfortable on nasal cannula oxygen HENT: Strong voice, no secretions, no stridor Lungs: Bilateral inspiratory crackles Cardiovascular: Regular, no murmur Abdomen: Obese, nondistended with positive bowel sounds Extremities: No significant edema Neuro: Awake, alert, interacting appropriately, follows commands  Resolved Hospital Problem list     Assessment & Plan:   Acute hypoxemic respiratory failure with acute bilateral pulmonary infiltrates COVID-19 pneumonia More chronic focal rounded left lower lobe infiltrates, question recurrent pneumonias.  Question chronic aspiration into LLL COPD Chronic  cough, multifactorial due to all the above plus GERD, rhinitis -Agree with empiric antibiotics for possible CAP although I suspect that her most recent symptoms have been related to the COVID-19 -Agree with dexamethasone for 10 days -Agree with remdesivir as ordered -Incruse and Breo (recently changed back from Strong to [employer] as an outpatient) -Push pulmonary hygiene, cough suppression -I had planned for her to have  a modified barium swallow as an outpatient.  If stable to do so we can please try to get this done while she is admitted to rule out chronic aspiration as a cause for her left lower lobe infiltrates. -We had planned for an outpatient PET scan on 10/04/2022.  Given the evolving infiltrates need to consider proceeding with bronchoscopy when stable from this acute illness.  Will discuss with her.     Labs   CBC: Recent Labs  Lab 09/23/22 1159 09/24/22 0453  WBC 3.0* 1.5*  NEUTROABS 2.1  --   HGB 10.1* 8.8*  HCT 31.0* 27.1*  MCV 76.9* 76.8*  PLT 136* 107*    Basic Metabolic Panel: Recent Labs  Lab 09/23/22 1159 09/24/22 0453  NA 129* 129*  K 3.3* 3.9  CL 94* 95*  CO2 23 22  GLUCOSE 218* 438*  BUN 14 17  CREATININE 1.25* 1.21*  CALCIUM 8.6* 8.3*  MG 1.3* 2.7*  PHOS 1.8* 5.5*   GFR: Estimated Creatinine Clearance: 49.1 mL/min (A) (by C-G formula based on SCr of 1.21 mg/dL (H)). Recent Labs  Lab 09/23/22 1159 09/23/22 1218 09/23/22 1431 09/24/22 0448 09/24/22 0453  PROCALCITON 0.15  --   --  0.14  --   WBC 3.0*  --   --   --  1.5*  LATICACIDVEN  --  1.6 1.5  --   --     Liver Function Tests: Recent Labs  Lab 09/23/22 1159 09/24/22 0453  AST 45* 33  ALT 28 24  ALKPHOS 47 40  BILITOT 0.8 0.5  PROT 6.8 5.5*  ALBUMIN 3.3* 2.6*   No results for input(s): "LIPASE", "AMYLASE" in the last 168 hours. No results for input(s): "AMMONIA" in the last 168 hours.  ABG No results found for: "PHART", "PCO2ART", "PO2ART", "HCO3", "TCO2", "ACIDBASEDEF", "O2SAT"   Coagulation Profile: Recent Labs  Lab 09/23/22 1159  INR 1.2    Cardiac Enzymes: No results for input(s): "CKTOTAL", "CKMB", "CKMBINDEX", "TROPONINI" in the last 168 hours.  HbA1C: Hemoglobin A1C  Date/Time Value Ref Range Status  06/10/2022 02:39 PM 7.7 (A) 4.0 - 5.6 % Final  11/16/2021 01:57 PM 7.7 (A) 4.0 - 5.6 % Final   Hgb A1c MFr Bld  Date/Time Value Ref Range Status  09/23/2022 11:59 AM 8.8  (H) 4.8 - 5.6 % Final    Comment:    (NOTE) Pre diabetes:          5.7%-6.4%  Diabetes:              >6.4%  Glycemic control for   <7.0% adults with diabetes   03/22/2019 10:20 AM 10.8 (H) <5.7 % of total Hgb Final    Comment:    For someone without known diabetes, a hemoglobin A1c value of 6.5% or greater indicates that they may have  diabetes and this should be confirmed with a follow-up  test. . For someone with known diabetes, a value <7% indicates  that their diabetes is well controlled and a value  greater than or equal to 7% indicates suboptimal  control. A1c targets should be individualized  based on  duration of diabetes, age, comorbid conditions, and  other considerations. . Currently, no consensus exists regarding use of hemoglobin A1c for diagnosis of diabetes for children. .     CBG: Recent Labs  Lab 09/23/22 1627 09/23/22 2157 09/24/22 0825 09/24/22 1219  GLUCAP 184* 292* 445* 511*    Review of Systems:   As Per HPI  Past Medical History:  She,  has a past medical history of Arthritis, Colon polyps (09/05/2017), COPD (chronic obstructive pulmonary disease) (HCC), Diabetes mellitus without complication (HCC), Dyspnea, Gastritis (09/05/2017), GERD (gastroesophageal reflux disease), Hyperlipidemia, Hypertension, Hypothyroidism, Panic attack, and RUQ pain (03/09/2017).   Surgical History:   Past Surgical History:  Procedure Laterality Date   BREAST BIOPSY Left 2008   CORE W/CLIP - NEG   BRONCHIAL BIOPSY  05/19/2020   Procedure: BRONCHIAL BIOPSIES;  Surgeon: Leslye Peer, MD;  Location: [facility] ENDOSCOPY;  Service: Pulmonary;;   BRONCHIAL BRUSHINGS  05/19/2020   Procedure: BRONCHIAL BRUSHINGS;  Surgeon: Leslye Peer, MD;  Location: [facility] ENDOSCOPY;  Service: Pulmonary;;   BRONCHIAL NEEDLE ASPIRATION BIOPSY  05/19/2020   Procedure: BRONCHIAL NEEDLE ASPIRATION BIOPSIES;  Surgeon: Leslye Peer, MD;  Location: [facility] ENDOSCOPY;  Service: Pulmonary;;   BRONCHIAL  WASHINGS  05/19/2020   Procedure: BRONCHIAL WASHINGS;  Surgeon: Leslye Peer, MD;  Location: [facility] ENDOSCOPY;  Service: Pulmonary;;   COLONOSCOPY WITH PROPOFOL N/A 02/08/2017   Procedure: COLONOSCOPY WITH PROPOFOL;  Surgeon: Christena Deem, MD;  Location: [facility] ENDOSCOPY;  Service: Endoscopy;  Laterality: N/A;   COLONOSCOPY WITH PROPOFOL N/A 05/05/2022   Procedure: COLONOSCOPY WITH PROPOFOL;  Surgeon: Toney Reil, MD;  Location: [facility] ENDOSCOPY;  Service: Gastroenterology;  Laterality: N/A;   ESOPHAGOGASTRODUODENOSCOPY (EGD) WITH PROPOFOL N/A 02/08/2017   Procedure: ESOPHAGOGASTRODUODENOSCOPY (EGD) WITH PROPOFOL;  Surgeon: Christena Deem, MD;  Location: [facility] ENDOSCOPY;  Service: Endoscopy;  Laterality: N/A;   TUBAL LIGATION     VIDEO BRONCHOSCOPY WITH ENDOBRONCHIAL NAVIGATION N/A 05/19/2020   Procedure: VIDEO BRONCHOSCOPY WITH ENDOBRONCHIAL NAVIGATION;  Surgeon: Leslye Peer, MD;  Location: [facility] ENDOSCOPY;  Service: Pulmonary;  Laterality: N/A;     Social History:   reports that she quit smoking about 18 years ago. Her smoking use included cigarettes. She started smoking about 58 years ago. She has a 40 pack-year smoking history. She has never used smokeless tobacco. She reports that she does not drink alcohol and does not use drugs.   Family History:  Her family history includes Diabetes in her maternal grandmother. There is no history of Breast cancer.   Allergies Allergies  Allergen Reactions   Penicillins Hives    Reaction: 1964     Home Medications  Prior to Admission medications   Medication Sig Start Date End Date Taking? Authorizing Provider  albuterol (PROVENTIL) (2.5 MG/3ML) 0.083% nebulizer solution Take 3 mLs (2.5 mg total) by nebulization every 6 (six) hours as needed for wheezing or shortness of breath. 03/01/22 03/01/23 Yes Margarita Mail, DO  albuterol (VENTOLIN HFA) 108 (90 Base) MCG/ACT inhaler Inhale 2 puffs into the lungs every 6 (six) hours as needed for  wheezing or shortness of breath. 09/17/20  Yes Leslye Peer, MD  Ascorbic Acid (VITAMIN C) 1000 MG tablet Take 1,000 mg by mouth daily.   Yes [provider]  aspirin EC 81 MG tablet Take 1 tablet (81 mg total) by mouth daily. Swallow whole. 05/18/22  Yes Meriam Sprague, MD  Cholecalciferol (VITAMIN D) 50 MCG (2000 UT) CAPS Take 1,000  Units by mouth daily.   Yes [provider]  dapagliflozin propanediol (FARXIGA) 5 MG TABS tablet Take 1 tablet (5 mg total) by mouth daily before breakfast. 11/27/21  Yes Carlus Pavlov, MD  ezetimibe (ZETIA) 10 MG tablet Take 1 tablet (10 mg total) by mouth daily. 02/16/22  Yes Flossie Dibble, NP  furosemide (LASIX) 40 MG tablet Take 1 tablet (40 mg total) by mouth daily. Patient taking differently: Take 40 mg by mouth as needed for edema. 05/31/22  Yes Danelle Berry, PA-C  gabapentin (NEURONTIN) 300 MG capsule Take 300 mg by mouth as needed. 10/16/21  Yes [provider]  hydrochlorothiazide (HYDRODIURIL) 25 MG tablet TAKE 1 TABLET EVERY DAY 05/31/22  Yes Margarita Mail, DO  Insulin NPH, Human,, Isophane, (NOVOLIN N FLEXPEN) 100 UNIT/ML Kiwkpen 36 units in am and 28-30 units at bedtime 07/05/22  Yes Carlus Pavlov, MD  levofloxacin (LEVAQUIN) 500 MG tablet Take 1 tablet (500 mg total) by mouth daily. 09/14/22  Yes Kalman Shan, MD  levothyroxine (SYNTHROID) 50 MCG tablet TAKE 1 TABLET EVERY DAY BEFORE BREAKFAST 07/26/22  Yes Margarita Mail, DO  meloxicam (MOBIC) 15 MG tablet TAKE 1/2 TO 1 TABLET EVERY DAY AS NEEDED FOR PAIN 09/09/22  Yes Margarita Mail, DO  metFORMIN (GLUCOPHAGE) 1000 MG tablet Take 1 tablet (1,000 mg total) by mouth 2 (two) times daily. 06/10/22  Yes Carlus Pavlov, MD  nystatin (MYCOSTATIN/NYSTOP) powder Apply 1 Application topically daily as needed (yeast under belly). 03/01/22  Yes Margarita Mail, DO  pantoprazole (PROTONIX) 40 MG tablet TAKE 1 TABLET TWICE DAILY 12/14/21  Yes Ainsleigh Kakos, Les Pou, MD  potassium chloride (KLOR-CON M) 10 MEQ tablet Take 1 tablet (10 mEq total) by mouth daily. 05/18/22  Yes Meriam Sprague, MD  rosuvastatin (CRESTOR) 20 MG tablet TAKE 1 TABLET AT BEDTIME 07/26/22  Yes Margarita Mail, DO  spironolactone (ALDACTONE) 25 MG tablet Take 0.5 tablets (12.5 mg total) by mouth daily. 05/18/22 09/23/22 Yes Meriam Sprague, MD  Tiotropium Bromide-Olodaterol (STIOLTO RESPIMAT) 2.5-2.5 MCG/ACT AERS Inhale 2 puffs into the lungs daily. 09/08/22  Yes Leslye Peer, MD  valsartan (DIOVAN) 80 MG tablet TAKE 1 TABLET (80 MG TOTAL) BY MOUTH DAILY. 05/12/22  Yes Berniece Salines, FNP  Accu-Chek Softclix Lancets lancets TEST BLOOD SUGAR TWO TIMES DAILY 07/28/22   Margarita Mail, DO  Alcohol Swabs (DROPSAFE ALCOHOL PREP) 70 % PADS USE AS DIRECTED TWICE DAILY WHEN TESTING BLOOD SUGAR 06/27/22   Carlus Pavlov, MD  DROPLET PEN NEEDLES 32G X 4 MM MISC 1 Device by Other route daily. 06/10/22   Carlus Pavlov, MD  predniSONE (DELTASONE) 10 MG tablet Take 10 mg by mouth See admin instructions. TAKE 4 TABLETS BY MOUTH EVERY MORNING FOR ONE DAY, THEN TAKE THREE TABLETS BY MOUTH EVERY MORNING FOR ONE DAY,THEN TAKE TWO TABLETS BY MOUTH FOR ONE DAY,THEN TAKE ONE TABLET BY MOUTH EVERY MORNING FOR ONE DAY THEN TAKE 1/2 TABLET BY MOUTH FOR ONE DAY Patient not taking: Reported on 09/23/2022    [provider]  TRUE METRIX BLOOD GLUCOSE TEST test strip Use 2x a day 06/10/22   Carlus Pavlov, MD     Critical care time: NA     Levy Pupa, MD, PhD 09/24/2022, 2:32 PM [facility] [phone] or if no answer before 7:00PM call [phone] For any issues after 7:00PM please call eLink [phone]

## 2022-09-24 NOTE — Progress Notes (Signed)
PROGRESS NOTE    [name]  WUJ:[id number] DOB: 1945/10/14 DOA: 09/23/2022 PCP: Margarita Mail, DO    Chief Complaint  Patient presents with   Shortness of Breath    Brief Narrative:   Tamara [name] is a 77 y.o. female with Past medical history of HTN, HLD, IDDM T2, COPD, hypothyroid, OSA, GERD, as reviewed from EMR, presented at [facility], ED with complaining of fever, worsening of shortness of breath and productive cough for 4 to 5 days.  Patient has been followed with pulmonary as an outpatient, where she has been treated with levofloxacin.  Her workup in ED significant for COVID-19 positive infection, her CTA chest was negative for PE, but with Extensive heterogeneous opacities throughout bilateral lungs with asymmetric more involvement of the left lung lower lobe, compatible with multilobar pneumonia.   Assessment & Plan:   Principal Problem:   Pneumonia due to COVID-19 virus  Acute respiratory failure with hypoxia Multilobar pneumonia COVID-19 pneumonia/infection Severe sepsis secondary to COVID-pneumonia Sepsis criteria, hypoxia, fever, leukopenia, tachypnea and pneumonia Continue supplemental O2 admission and gradually wean off, she remains on 3 L oxygen Started remdesivir, Started Decadron 6 mg p.o. daily for 10 days giving her hypoxia PPI for GI prophylaxis Started Breo Ellipta inhaler, Incruse inhaler and albuterol inhaler Started Mucinex 600 mg p.o. twice daily, Robitussin DM as needed Opacity on imaging are not typical groundglass opacity which is present with COVID-19 pneumonia, so she was started on IV Rocephin and azithromycin to treat for bacterial pneumonia as well pulmonary has been consulted given his extensive history of lung disease, and possible multiple etiologies of her CT multilobar pneumonia     Hypokalemia,  - potassium repleted.  Hypophosphatemia, -  Phos repleted.  Hypomagnesemia - mag repleted.  Hyponatremia -  continue  to monitor   Pancytopenia - could be due to viral infection.   - Monitor CBC daily     IDDM T2 Held home regimen for now Started NovoLog sliding scale Monitor CBG and continue diabetic diet     HTN, HLD Resumed home meds aspirin, statin, Zetia, irbesartan Diuretics changed to Lasix 20 mg every 12 hourly Monitor BP and titrate medications accordingly Monitor urine output and renal functions   Hypothyroid, continued Synthroid  Diabetes mellitus, type II, insulin-dependent-CBG significantly uncontrolled hyperglycemia -BG significantly uncontrolled, secondary to steroid use, with acute infection, will change her insulin during hospital stay to [facility] for better control, will add insulin sliding scale.  CKD stage IIIa -Monitor renal function closely and avoid nephrotoxic medications     Nutrition: Carb modified diet   DVT prophylaxis: Lovenox Code Status: Full code Family Communication: Discussed with husband at bedside Disposition:   Status is: Inpatient  Consultants:  pulmonary   Subjective:  Reports generalized weakness, fatigue, some dyspnea and cough, reports loss of taste  Objective: Vitals:   09/24/22 0000 09/24/22 0736 09/24/22 0827 09/24/22 1139  BP: (!) 95/51  (!) 111/47 (!) 105/53  Pulse: 69 76 79   Resp: 20 18 19    Temp: 99.6 F (37.6 C)  98 F (36.7 C) 97.6 F (36.4 C)  TempSrc: Oral  Oral Oral  SpO2: 93% 92% 93%   Weight:      Height:        Intake/Output Summary (Last 24 hours) at 09/24/2022 1412 Last data filed at 09/24/2022 0600 Gross per 24 hour  Intake 862.66 ml  Output 100 ml  Net 762.66 ml   Filed Weights   09/23/22 1156  Weight: 104.8 kg    Examination:  Awake Alert, Oriented X 3, No new F.N deficits, Normal affect Symmetrical Chest wall movement, scattered Rales and rhonchi, RRR,No Gallops,Rubs or new Murmurs, No Parasternal Heave +ve B.Sounds, Abd Soft, No tenderness, No rebound - guarding or rigidity. No Cyanosis,  Clubbing or edema, No new Rash or bruise       Data Reviewed: I have personally reviewed following labs and imaging studies  CBC: Recent Labs  Lab 09/23/22 1159 09/24/22 0453  WBC 3.0* 1.5*  NEUTROABS 2.1  --   HGB 10.1* 8.8*  HCT 31.0* 27.1*  MCV 76.9* 76.8*  PLT 136* 107*    Basic Metabolic Panel: Recent Labs  Lab 09/23/22 1159 09/24/22 0453  NA 129* 129*  K 3.3* 3.9  CL 94* 95*  CO2 23 22  GLUCOSE 218* 438*  BUN 14 17  CREATININE 1.25* 1.21*  CALCIUM 8.6* 8.3*  MG 1.3* 2.7*  PHOS 1.8* 5.5*    GFR: Estimated Creatinine Clearance: 49.1 mL/min (A) (by C-G formula based on SCr of 1.21 mg/dL (H)).  Liver Function Tests: Recent Labs  Lab 09/23/22 1159 09/24/22 0453  AST 45* 33  ALT 28 24  ALKPHOS 47 40  BILITOT 0.8 0.5  PROT 6.8 5.5*  ALBUMIN 3.3* 2.6*    CBG: Recent Labs  Lab 09/23/22 1627 09/23/22 2157 09/24/22 0825 09/24/22 1219  GLUCAP 184* 292* 445* 511*     Recent Results (from the past 240 hour(s))  Culture, blood (Routine x 2)     Status: None (Preliminary result)   Collection Time: 09/23/22 11:59 AM   Specimen: BLOOD LEFT ARM  Result Value Ref Range Status   Specimen Description BLOOD LEFT ARM  Final   Special Requests   Final    BOTTLES DRAWN AEROBIC AND ANAEROBIC Blood Culture adequate volume   Culture   Final    NO GROWTH < 24 HOURS Performed at [facility], [address] [place], [place] [address]    Report Status PENDING  Incomplete  Resp panel by RT-PCR (RSV, Flu A&B, Covid) Anterior Nasal Swab     Status: Abnormal   Collection Time: 09/23/22 11:59 AM   Specimen: Anterior Nasal Swab  Result Value Ref Range Status   SARS Coronavirus 2 by RT PCR POSITIVE (A) NEGATIVE Final   Influenza A by PCR NEGATIVE NEGATIVE Final   Influenza B by PCR NEGATIVE NEGATIVE Final    Comment: (NOTE) The Xpert Xpress SARS-CoV-2/FLU/RSV plus assay is intended as an aid in the diagnosis of influenza from Nasopharyngeal swab specimens  and should not be used as a sole basis for treatment. Nasal washings and aspirates are unacceptable for Xpert Xpress SARS-CoV-2/FLU/RSV testing.  Fact Sheet for Patients: BloggerCourse.com  Fact Sheet for Healthcare Providers: SeriousBroker.it  This test is not yet approved or cleared by the [place] FDA and has been authorized for detection and/or diagnosis of SARS-CoV-2 by FDA under an Emergency Use Authorization (EUA). This EUA will remain in effect (meaning this test can be used) for the duration of the COVID-19 declaration under Section 564(b)(1) of the Act, 21 U.S.C. section 360bbb-3(b)(1), unless the authorization is terminated or revoked.     Resp Syncytial Virus by PCR NEGATIVE NEGATIVE Final    Comment: (NOTE) Fact Sheet for Patients: BloggerCourse.com  Fact Sheet for Healthcare Providers: SeriousBroker.it  This test is not yet approved or cleared by the [place] FDA and has been authorized for detection and/or diagnosis of SARS-CoV-2 by  FDA under an Emergency Use Authorization (EUA). This EUA will remain in effect (meaning this test can be used) for the duration of the COVID-19 declaration under Section 564(b)(1) of the Act, 21 U.S.C. section 360bbb-3(b)(1), unless the authorization is terminated or revoked.  Performed at [facility], [address] [place], [place] [address]   Culture, blood (Routine x 2)     Status: None (Preliminary result)   Collection Time: 09/23/22  1:30 PM   Specimen: BLOOD RIGHT ARM  Result Value Ref Range Status   Specimen Description BLOOD RIGHT ARM  Final   Special Requests   Final    BOTTLES DRAWN AEROBIC AND ANAEROBIC Blood Culture adequate volume   Culture   Final    NO GROWTH < 24 HOURS Performed at [facility], [address], [place], [place] [address]    Report Status PENDING  Incomplete          Radiology Studies: CT Angio Chest PE W and/or Wo Contrast  Result Date: 09/23/2022 CLINICAL DATA:  Pulmonary embolism (PE) suspected, high prob. Shortness of breath. Cough. Fever. EXAM: CT ANGIOGRAPHY CHEST WITH CONTRAST TECHNIQUE: Multidetector CT imaging of the chest was performed using the standard protocol during bolus administration of intravenous contrast. Multiplanar CT image reconstructions and MIPs were obtained to evaluate the vascular anatomy. RADIATION DOSE REDUCTION: This exam was performed according to the departmental dose-optimization program which includes automated exposure control, adjustment of the mA and/or kV according to patient size and/or use of iterative reconstruction technique. CONTRAST:  OMNIPAQUE IOHEXOL 350 MG/ML SOLN COMPARISON:  CT scan chest from 08/02/2022. FINDINGS: Cardiovascular: No evidence of embolism to the proximal subsegmental pulmonary artery level. Mild cardiomegaly. No pericardial effusion. No aortic aneurysm. There are coronary artery calcifications, in keeping with coronary artery disease. There are also mild-to-moderate peripheral atherosclerotic vascular calcifications of thoracic aorta and its major branches. Mediastinum/Nodes: Visualized thyroid gland appears grossly unremarkable. No solid / cystic mediastinal masses. The esophagus is nondistended precluding optimal assessment. There are few mildly prominent mediastinal and hilar lymph nodes, which do not meet the size criteria for lymphadenopathy and though indeterminate most likely benign/reactive in etiology. No axillary lymphadenopathy by size criteria. Lungs/Pleura: The central tracheo-bronchial tree is patent. There are extensive heterogeneous opacities throughout bilateral lungs with asymmetric more involvement of left lung lower lobe, compatible with multilobar pneumonia. No pleural effusion or pneumothorax. Upper Abdomen: There is liver surface irregularity/nodularity, compatible with  cirrhosis. There is a subcapsular 1.1 x 1.5 cm cyst in the left hepatic lobe, segment 2. There is a sinus cyst in the left kidney upper pole measuring 1.4 x 2.0 cm. Musculoskeletal: The visualized soft tissues of the chest wall are grossly unremarkable. No suspicious osseous lesions. There are mild to moderate multilevel degenerative changes in the visualized spine. Review of the MIP images confirms the above findings. IMPRESSION: 1. No evidence of pulmonary embolism. 2. Extensive heterogeneous opacities throughout bilateral lungs with asymmetric more involvement of the left lung lower lobe, compatible with multilobar pneumonia. 3. Multiple other nonacute observations, as described above. Aortic Atherosclerosis (ICD10-I70.0). Electronically Signed   By: Jules Schick M.D.   On: 09/23/2022 16:11   DG Chest 2 View  Result Date: 09/23/2022 CLINICAL DATA:  Suspected Sepsis.  Shortness of breath.  Cough. EXAM: CHEST - 2 VIEW COMPARISON:  09/14/2022. FINDINGS: Redemonstration of increased interstitial markings throughout bilateral lungs. Heterogeneous opacity overlying the left lung lower lobe is essentially unchanged. However, there are new patchy opacities  overlying the right lateral costophrenic angle which may represent superimposed pneumonitis versus atelectasis. Bilateral lateral costophrenic angles are clear. Stable cardio-mediastinal silhouette. No acute osseous abnormalities. The soft tissues are within normal limits. IMPRESSION: 1. Chronic interstitial lung disease. 2. New patchy opacities overlying the right lateral costophrenic angle may represent superimposed pneumonitis versus atelectasis. 3. Persistent left lung lower lobe opacities. Electronically Signed   By: Jules Schick M.D.   On: 09/23/2022 14:01        Scheduled Meds:  albuterol  3 mL Inhalation BID   vitamin C  500 mg Oral Daily   aspirin EC  81 mg Oral Daily   cholecalciferol  1,000 Units Oral Daily   dapagliflozin propanediol  5 mg  Oral Daily   dexamethasone  6 mg Oral Daily   enoxaparin (LOVENOX) injection  40 mg Subcutaneous Q24H   ezetimibe  10 mg Oral Daily   fluticasone furoate-vilanterol  1 puff Inhalation Daily   furosemide  20 mg Intravenous Q12H   guaiFENesin  600 mg Oral BID   insulin aspart  0-20 Units Subcutaneous TID WC   insulin aspart  4 Units Subcutaneous TID WC   [START ON 09/25/2022] insulin glargine-yfgn  25 Units Subcutaneous Daily   irbesartan  75 mg Oral Daily   levothyroxine  50 mcg Oral Q0600   multivitamin with minerals  1 tablet Oral Daily   pantoprazole  40 mg Oral BID   sodium chloride flush  3 mL Intravenous Q12H   sodium chloride flush  3 mL Intravenous Q12H   umeclidinium bromide  1 puff Inhalation Daily   Continuous Infusions:  sodium chloride     azithromycin 500 mg (09/24/22 1037)   cefTRIAXone (ROCEPHIN)  IV 2 g (09/24/22 1030)   remdesivir 100 mg in sodium chloride 0.9 % 100 mL IVPB 100 mg (09/24/22 1149)     LOS: 1 day       Huey Bienenstock, MD [facility]   To contact the attending provider between 7A-7P or the covering provider during after hours 7P-7A, please log into the web site [email or web address] and access using universal [place] password for that web site. If you do not have the password, please call the hospital operator.  09/24/2022, 2:12 PM

## 2022-09-24 NOTE — Plan of Care (Signed)
  Problem: Fluid Volume: Goal: Ability to maintain a balanced intake and output will improve Outcome: Progressing   Problem: Health Behavior/Discharge Planning: Goal: Ability to manage health-related needs will improve Outcome: Progressing   Problem: Metabolic: Goal: Ability to maintain appropriate glucose levels will improve Outcome: Progressing   Problem: Nutritional: Goal: Maintenance of adequate nutrition will improve Outcome: Progressing   Problem: Tissue Perfusion: Goal: Adequacy of tissue perfusion will improve Outcome: Progressing   Problem: Respiratory: Goal: Will maintain a patent airway Outcome: Progressing

## 2022-09-24 NOTE — Inpatient Diabetes Management (Signed)
Inpatient Diabetes Program Recommendations  AACE/ADA: New Consensus Statement on Inpatient Glycemic Control (2015)  Target Ranges:  Prepandial:   less than 140 mg/dL      Peak postprandial:   less than 180 mg/dL (1-2 hours)      Critically ill patients:  140 - 180 mg/dL   Lab Results  Component Value Date   GLUCAP 445 (H) 09/24/2022   HGBA1C 8.8 (H) 09/23/2022    Review of Glycemic Control  Diabetes history: type 2 Outpatient Diabetes medications: NPH Novolin 36 units every am, 28-30 units every HS, Metformin 1000 mg BID, Farxiga 5 mg daily Current orders for Inpatient glycemic control: Semglee 22 units daily, Novolog 0-15 units correction scale TID, Novolog 4 units TID  Inpatient Diabetes Program Recommendations:   Spoke with patient on the phone. Patient has had diabetes about 24 years; states that she had been on Lipitor and was diagnosed after starting it. She also states that she has been on steroids frequently in the past few years which causes her blood sugars to be out of control. Is scheduled to see her diabetes doctor next month. States that she takes her insulin every day. Discussed her HgbA1C of 8.8% which she states that it is high. It has been down to 7% in the past. Patient lives with her husband.  Patient has home blood glucose meter at home and checks blood sugars twice per day. She is not interested in a continuous glucose monitor and does not want to use her phone for monitoring it. She is satisfied with using the glucose meter.   Will continue to monitor blood sugars while in the hospital.  Smith Mince RN BSN CDE Diabetes Coordinator Pager: [phone]  8am-5pm

## 2022-09-24 NOTE — Progress Notes (Signed)
RT instructed patient and family on the use of a flutter valve. Patient able to demonstrate back good technique. 

## 2022-09-25 DIAGNOSIS — U071 COVID-19: Secondary | ICD-10-CM | POA: Diagnosis not present

## 2022-09-25 DIAGNOSIS — J189 Pneumonia, unspecified organism: Secondary | ICD-10-CM | POA: Diagnosis not present

## 2022-09-25 DIAGNOSIS — R0902 Hypoxemia: Secondary | ICD-10-CM | POA: Diagnosis not present

## 2022-09-25 DIAGNOSIS — J1282 Pneumonia due to coronavirus disease 2019: Secondary | ICD-10-CM | POA: Diagnosis not present

## 2022-09-25 LAB — MAGNESIUM: Magnesium: 2 mg/dL (ref 1.7–2.4)

## 2022-09-25 LAB — HEPATIC FUNCTION PANEL
ALT: 23 U/L (ref 0–44)
AST: 30 U/L (ref 15–41)
Albumin: 2.8 g/dL — ABNORMAL LOW (ref 3.5–5.0)
Alkaline Phosphatase: 47 U/L (ref 38–126)
Bilirubin, Direct: <0.1 mg/dL (ref 0.0–0.2)
Total Bilirubin: 0.4 mg/dL (ref 0.3–1.2)
Total Protein: 6 g/dL — ABNORMAL LOW (ref 6.5–8.1)

## 2022-09-25 LAB — CBC
HCT: 28.2 % — ABNORMAL LOW (ref 36.0–46.0)
Hemoglobin: 9.1 g/dL — ABNORMAL LOW (ref 12.0–15.0)
MCH: 24.3 pg — ABNORMAL LOW (ref 26.0–34.0)
MCHC: 32.3 g/dL (ref 30.0–36.0)
MCV: 75.4 fL — ABNORMAL LOW (ref 80.0–100.0)
Platelets: 144 10*3/uL — ABNORMAL LOW (ref 150–400)
RBC: 3.74 MIL/uL — ABNORMAL LOW (ref 3.87–5.11)
RDW: 15.1 % (ref 11.5–15.5)
WBC: 3.8 10*3/uL — ABNORMAL LOW (ref 4.0–10.5)
nRBC: 0 % (ref 0.0–0.2)

## 2022-09-25 LAB — GLUCOSE, CAPILLARY
Glucose-Capillary: 371 mg/dL — ABNORMAL HIGH (ref 70–99)
Glucose-Capillary: 345 mg/dL — ABNORMAL HIGH (ref 70–99)
Glucose-Capillary: 249 mg/dL — ABNORMAL HIGH (ref 70–99)
Glucose-Capillary: 339 mg/dL — ABNORMAL HIGH (ref 70–99)

## 2022-09-25 LAB — BASIC METABOLIC PANEL
Anion gap: 12 (ref 5–15)
BUN: 26 mg/dL — ABNORMAL HIGH (ref 8–23)
CO2: 21 mmol/L — ABNORMAL LOW (ref 22–32)
Calcium: 8.5 mg/dL — ABNORMAL LOW (ref 8.9–10.3)
Chloride: 98 mmol/L (ref 98–111)
Creatinine, Ser: 1.35 mg/dL — ABNORMAL HIGH (ref 0.44–1.00)
GFR, Estimated: 41 mL/min — ABNORMAL LOW (ref >60)
Glucose, Bld: 386 mg/dL — ABNORMAL HIGH (ref 70–99)
Potassium: 3.6 mmol/L (ref 3.5–5.1)
Sodium: 131 mmol/L — ABNORMAL LOW (ref 135–145)

## 2022-09-25 LAB — PHOSPHORUS: Phosphorus: 3.5 mg/dL (ref 2.5–4.6)

## 2022-09-25 MED ORDER — INSULIN GLARGINE-YFGN 100 UNIT/ML ~~LOC~~ SOLN
30.0000 [IU] | Freq: Every day | SUBCUTANEOUS | Status: DC
  Filled 2022-09-25: qty 0.3

## 2022-09-25 MED ORDER — INSULIN ASPART 100 UNIT/ML IJ SOLN
0.0000 [IU] | Freq: Every day | INTRAMUSCULAR | Status: DC
  Administered 2022-09-25: 4 [IU] via SUBCUTANEOUS
  Administered 2022-09-26: 3 [IU] via SUBCUTANEOUS
  Administered 2022-09-27: 5 [IU] via SUBCUTANEOUS
  Administered 2022-09-28 – 2022-09-29 (×2): 3 [IU] via SUBCUTANEOUS

## 2022-09-25 MED ORDER — INSULIN GLARGINE-YFGN 100 UNIT/ML ~~LOC~~ SOLN
5.0000 [IU] | Freq: Once | SUBCUTANEOUS | Status: AC
  Administered 2022-09-25: 5 [IU] via SUBCUTANEOUS
  Filled 2022-09-25: qty 0.05

## 2022-09-25 MED ORDER — INSULIN ASPART 100 UNIT/ML IJ SOLN
5.0000 [IU] | Freq: Once | INTRAMUSCULAR | Status: AC
  Administered 2022-09-25: 5 [IU] via SUBCUTANEOUS

## 2022-09-25 MED ORDER — INSULIN ASPART 100 UNIT/ML IJ SOLN: 0.0000 [IU] | Freq: Three times a day (TID) | INTRAMUSCULAR | Status: DC

## 2022-09-25 NOTE — Progress Notes (Signed)
SLP Cancellation Note  Patient Details Name: Tamara [name] MRN: [id number] DOB: [birth year]   Cancelled treatment:       Reason Eval/Treat Not Completed: Other (comment)  SLP contacted MD regarding this patient. Order is for [facility] and patient was already scheduled for an OP MBS on 9/9. SLP will plan for MBS on Monday; MD aware and in agreement.   Angela Nevin, MA, CCC-SLP Speech Therapy

## 2022-09-25 NOTE — Plan of Care (Signed)
Patient coughing less and feels some better

## 2022-09-25 NOTE — Plan of Care (Signed)
  Problem: Health Behavior/Discharge Planning: Goal: Ability to manage health-related needs will improve Outcome: Progressing   Problem: Health Behavior/Discharge Planning: Goal: Ability to identify and utilize available resources and services will improve Outcome: Progressing   Problem: Health Behavior/Discharge Planning: Goal: Ability to identify and utilize available resources and services will improve Outcome: Progressing   Problem: Fluid Volume: Goal: Ability to maintain a balanced intake and output will improve Outcome: Progressing

## 2022-09-25 NOTE — Progress Notes (Signed)
PROGRESS NOTE    Tamara [name]  NWG:[id number] DOB: 20-Jan-1945 DOA: 09/23/2022 PCP: Margarita Mail, DO    Chief Complaint  Patient presents with   Shortness of Breath    Brief Narrative:   Tamara [name] is a 77 y.o. female with Past medical history of HTN, HLD, IDDM T2, COPD, hypothyroid, OSA, GERD, as reviewed from EMR, presented at [facility], ED with complaining of fever, worsening of shortness of breath and productive cough for 4 to 5 days.  Patient has been followed with pulmonary as an outpatient, where she has been treated with levofloxacin.  Her workup in ED significant for COVID-19 positive infection, her CTA chest was negative for PE, but with Extensive heterogeneous opacities throughout bilateral lungs with asymmetric more involvement of the left lung lower lobe, compatible with multilobar pneumonia.   Assessment & Plan:   Principal Problem:   Pneumonia due to COVID-19 virus  Acute respiratory failure with hypoxia Multilobar pneumonia COVID-19 pneumonia/infection Severe sepsis secondary to COVID-pneumonia Sepsis criteria, hypoxia, fever, leukopenia, tachypnea and pneumonia Continue supplemental O2 admission and gradually wean off, she remains on 3 L oxygen Started remdesivir, Continue Decadron 6 mg p.o. daily for 10 days giving her hypoxia PPI for GI prophylaxis Started Breo Ellipta inhaler, Incruse inhaler and albuterol inhaler Started Mucinex 600 mg p.o. twice daily, Robitussin DM as needed Opacity on imaging are not typical groundglass opacity which is present with COVID-19 pneumonia, so she was started on IV Rocephin and azithromycin to treat for bacterial pneumonia as well pulmonary has been consulted given his extensive history of lung disease, and possible multiple etiologies of her CT multilobar pneumonia -Pulmonary input greatly appreciated, will request SLP to evaluate see if MBS can be done during hospital stay. -Further workup per pulmonary,  plan for PET scan on 9/16, but patient with evolving infiltrate, so they may consider proceeding with bronchoscopy when stable     Hypokalemia,  - potassium repleted.  Hypophosphatemia, -  Phos repleted.  Hypomagnesemia - mag repleted.  Hyponatremia -  continue to monitor   Pancytopenia - could be due to viral infection.   - Monitor CBC daily     IDDM T2 Difficultly uncontrolled, continue with Marcelline Deist, will hold her NPH insulin, started on Semglee, insulin sliding scale and Premeal NovoLog, dose uptitrated     HTN, HLD Resumed home meds aspirin, statin, Zetia, irbesartan Diuretics changed to Lasix 20 mg every 12 hourly Monitor BP and titrate medications accordingly Monitor urine output and renal functions   Hypothyroid, continued Synthroid  Diabetes mellitus, type II, insulin-dependent-CBG significantly uncontrolled hyperglycemia -BG significantly uncontrolled, secondary to steroid use, with acute infection, will change her insulin during hospital stay to [facility] for better control, will add insulin sliding scale.  CKD stage IIIa -Monitor renal function closely and avoid nephrotoxic medications     Nutrition: Carb modified diet   DVT prophylaxis: Lovenox Code Status: Full code Family Communication: Discussed with husband at bedside Disposition:   Status is: Inpatient  Consultants:  pulmonary   Subjective:  Generalized weakness, fatigue, cough  Objective: Vitals:   09/25/22 0000 09/25/22 0300 09/25/22 0800 09/25/22 1206  BP: (!) 114/51 (!) 109/51    Pulse:      Resp:    20  Temp: 97.8 F (36.6 C) 98.2 F (36.8 C) 97.6 F (36.4 C) (!) 97.3 F (36.3 C)  TempSrc: Oral Oral Axillary Oral  SpO2:      Weight:      Height:  Intake/Output Summary (Last 24 hours) at 09/25/2022 1300 Last data filed at 09/25/2022 1030 Gross per 24 hour  Intake 1310.22 ml  Output 1450 ml  Net -139.78 ml   Filed Weights   09/23/22 1156  Weight: 104.8 kg     Examination:  Patient is awake Alert, Oriented X 3, No new F.N deficits, Normal affect Symmetrical Chest wall movement, decreased air entry at the bases, scattered rales and rhonchi RRR,No Gallops,Rubs or new Murmurs, No Parasternal Heave +ve B.Sounds, Abd Soft, No tenderness, No rebound - guarding or rigidity. No Cyanosis, Clubbing or edema, No new Rash or bruise       Data Reviewed: I have personally reviewed following labs and imaging studies  CBC: Recent Labs  Lab 09/23/22 1159 09/24/22 0453 09/25/22 1030  WBC 3.0* 1.5* 3.8*  NEUTROABS 2.1  --   --   HGB 10.1* 8.8* 9.1*  HCT 31.0* 27.1* 28.2*  MCV 76.9* 76.8* 75.4*  PLT 136* 107* 144*    Basic Metabolic Panel: Recent Labs  Lab 09/23/22 1159 09/24/22 0453 09/25/22 1030  NA 129* 129* 131*  K 3.3* 3.9 3.6  CL 94* 95* 98  CO2 23 22 21*  GLUCOSE 218* 438* 386*  BUN 14 17 26*  CREATININE 1.25* 1.21* 1.35*  CALCIUM 8.6* 8.3* 8.5*  MG 1.3* 2.7* 2.0  PHOS 1.8* 5.5* 3.5    GFR: Estimated Creatinine Clearance: 44.1 mL/min (A) (by C-G formula based on SCr of 1.35 mg/dL (H)).  Liver Function Tests: Recent Labs  Lab 09/23/22 1159 09/24/22 0453 09/25/22 1030  AST 45* 33 30  ALT 28 24 23   ALKPHOS 47 40 47  BILITOT 0.8 0.5 0.4  PROT 6.8 5.5* 6.0*  ALBUMIN 3.3* 2.6* 2.8*    CBG: Recent Labs  Lab 09/24/22 1219 09/24/22 1611 09/24/22 2142 09/25/22 0851 09/25/22 1202  GLUCAP 511* 372* 291* 371* 345*     Recent Results (from the past 240 hour(s))  Culture, blood (Routine x 2)     Status: None (Preliminary result)   Collection Time: 09/23/22 11:59 AM   Specimen: BLOOD LEFT ARM  Result Value Ref Range Status   Specimen Description BLOOD LEFT ARM  Final   Special Requests   Final    BOTTLES DRAWN AEROBIC AND ANAEROBIC Blood Culture adequate volume   Culture   Final    NO GROWTH 2 DAYS Performed at [facility], [address], [place], [place] [address]    Report Status PENDING  Incomplete   Resp panel by RT-PCR (RSV, Flu A&B, Covid) Anterior Nasal Swab     Status: Abnormal   Collection Time: 09/23/22 11:59 AM   Specimen: Anterior Nasal Swab  Result Value Ref Range Status   SARS Coronavirus 2 by RT PCR POSITIVE (A) NEGATIVE Final   Influenza A by PCR NEGATIVE NEGATIVE Final   Influenza B by PCR NEGATIVE NEGATIVE Final    Comment: (NOTE) The Xpert Xpress SARS-CoV-2/FLU/RSV plus assay is intended as an aid in the diagnosis of influenza from Nasopharyngeal swab specimens and should not be used as a sole basis for treatment. Nasal washings and aspirates are unacceptable for Xpert Xpress SARS-CoV-2/FLU/RSV testing.  Fact Sheet for Patients: BloggerCourse.com  Fact Sheet for Healthcare Providers: SeriousBroker.it  This test is not yet approved or cleared by the [place] FDA and has been authorized for detection and/or diagnosis of SARS-CoV-2 by FDA under an Emergency Use Authorization (EUA). This EUA will remain in effect (meaning this test can be  used) for the duration of the COVID-19 declaration under Section 564(b)(1) of the Act, 21 U.S.C. section 360bbb-3(b)(1), unless the authorization is terminated or revoked.     Resp Syncytial Virus by PCR NEGATIVE NEGATIVE Final    Comment: (NOTE) Fact Sheet for Patients: BloggerCourse.com  Fact Sheet for Healthcare Providers: SeriousBroker.it  This test is not yet approved or cleared by the [place] FDA and has been authorized for detection and/or diagnosis of SARS-CoV-2 by FDA under an Emergency Use Authorization (EUA). This EUA will remain in effect (meaning this test can be used) for the duration of the COVID-19 declaration under Section 564(b)(1) of the Act, 21 U.S.C. section 360bbb-3(b)(1), unless the authorization is terminated or revoked.  Performed at [facility], [address] [place],  [place] [address]   Culture, blood (Routine x 2)     Status: None (Preliminary result)   Collection Time: 09/23/22  1:30 PM   Specimen: BLOOD RIGHT ARM  Result Value Ref Range Status   Specimen Description BLOOD RIGHT ARM  Final   Special Requests   Final    BOTTLES DRAWN AEROBIC AND ANAEROBIC Blood Culture adequate volume   Culture   Final    NO GROWTH 2 DAYS Performed at [facility], [address], [place], [place] [address]    Report Status PENDING  Incomplete         Radiology Studies: CT Angio Chest PE W and/or Wo Contrast  Result Date: 09/23/2022 CLINICAL DATA:  Pulmonary embolism (PE) suspected, high prob. Shortness of breath. Cough. Fever. EXAM: CT ANGIOGRAPHY CHEST WITH CONTRAST TECHNIQUE: Multidetector CT imaging of the chest was performed using the standard protocol during bolus administration of intravenous contrast. Multiplanar CT image reconstructions and MIPs were obtained to evaluate the vascular anatomy. RADIATION DOSE REDUCTION: This exam was performed according to the departmental dose-optimization program which includes automated exposure control, adjustment of the mA and/or kV according to patient size and/or use of iterative reconstruction technique. CONTRAST:  OMNIPAQUE IOHEXOL 350 MG/ML SOLN COMPARISON:  CT scan chest from 08/02/2022. FINDINGS: Cardiovascular: No evidence of embolism to the proximal subsegmental pulmonary artery level. Mild cardiomegaly. No pericardial effusion. No aortic aneurysm. There are coronary artery calcifications, in keeping with coronary artery disease. There are also mild-to-moderate peripheral atherosclerotic vascular calcifications of thoracic aorta and its major branches. Mediastinum/Nodes: Visualized thyroid gland appears grossly unremarkable. No solid / cystic mediastinal masses. The esophagus is nondistended precluding optimal assessment. There are few mildly prominent mediastinal and hilar lymph nodes, which do not meet the size  criteria for lymphadenopathy and though indeterminate most likely benign/reactive in etiology. No axillary lymphadenopathy by size criteria. Lungs/Pleura: The central tracheo-bronchial tree is patent. There are extensive heterogeneous opacities throughout bilateral lungs with asymmetric more involvement of left lung lower lobe, compatible with multilobar pneumonia. No pleural effusion or pneumothorax. Upper Abdomen: There is liver surface irregularity/nodularity, compatible with cirrhosis. There is a subcapsular 1.1 x 1.5 cm cyst in the left hepatic lobe, segment 2. There is a sinus cyst in the left kidney upper pole measuring 1.4 x 2.0 cm. Musculoskeletal: The visualized soft tissues of the chest wall are grossly unremarkable. No suspicious osseous lesions. There are mild to moderate multilevel degenerative changes in the visualized spine. Review of the MIP images confirms the above findings. IMPRESSION: 1. No evidence of pulmonary embolism. 2. Extensive heterogeneous opacities throughout bilateral lungs with asymmetric more involvement of the left lung lower lobe, compatible with multilobar pneumonia. 3. Multiple  other nonacute observations, as described above. Aortic Atherosclerosis (ICD10-I70.0). Electronically Signed   By: Jules Schick M.D.   On: 09/23/2022 16:11        Scheduled Meds:  albuterol  3 mL Inhalation BID   vitamin C  500 mg Oral Daily   aspirin EC  81 mg Oral Daily   cholecalciferol  1,000 Units Oral Daily   dapagliflozin propanediol  5 mg Oral Daily   dexamethasone  6 mg Oral Daily   enoxaparin (LOVENOX) injection  40 mg Subcutaneous Q24H   ezetimibe  10 mg Oral Daily   fluticasone furoate-vilanterol  1 puff Inhalation Daily   furosemide  20 mg Intravenous Q12H   guaiFENesin  600 mg Oral BID   insulin aspart  0-20 Units Subcutaneous TID WC   insulin aspart  4 Units Subcutaneous TID WC   [START ON 09/26/2022] insulin glargine-yfgn  30 Units Subcutaneous Daily   insulin  glargine-yfgn  5 Units Subcutaneous Once   irbesartan  75 mg Oral Daily   levothyroxine  50 mcg Oral Q0600   multivitamin with minerals  1 tablet Oral Daily   pantoprazole  40 mg Oral BID   sodium chloride flush  3 mL Intravenous Q12H   sodium chloride flush  3 mL Intravenous Q12H   umeclidinium bromide  1 puff Inhalation Daily   Continuous Infusions:  sodium chloride     azithromycin 500 mg (09/25/22 0849)   cefTRIAXone (ROCEPHIN)  IV 2 g (09/25/22 0839)     LOS: 2 days       Huey Bienenstock, MD [facility]   To contact the attending provider between 7A-7P or the covering provider during after hours 7P-7A, please log into the web site [email or web address] and access using universal [place] password for that web site. If you do not have the password, please call the hospital operator.  09/25/2022, 1:00 PM

## 2022-09-26 DIAGNOSIS — U071 COVID-19: Secondary | ICD-10-CM | POA: Diagnosis not present

## 2022-09-26 DIAGNOSIS — J1282 Pneumonia due to coronavirus disease 2019: Secondary | ICD-10-CM | POA: Diagnosis not present

## 2022-09-26 LAB — CBC
HCT: 27.4 % — ABNORMAL LOW (ref 36.0–46.0)
Hemoglobin: 8.9 g/dL — ABNORMAL LOW (ref 12.0–15.0)
MCH: 24.3 pg — ABNORMAL LOW (ref 26.0–34.0)
MCHC: 32.5 g/dL (ref 30.0–36.0)
MCV: 74.7 fL — ABNORMAL LOW (ref 80.0–100.0)
Platelets: 149 10*3/uL — ABNORMAL LOW (ref 150–400)
RBC: 3.67 MIL/uL — ABNORMAL LOW (ref 3.87–5.11)
RDW: 15.2 % (ref 11.5–15.5)
WBC: 4 10*3/uL (ref 4.0–10.5)
nRBC: 0 % (ref 0.0–0.2)

## 2022-09-26 LAB — BASIC METABOLIC PANEL
Anion gap: 11 (ref 5–15)
BUN: 29 mg/dL — ABNORMAL HIGH (ref 8–23)
CO2: 25 mmol/L (ref 22–32)
Calcium: 9.2 mg/dL (ref 8.9–10.3)
Chloride: 96 mmol/L — ABNORMAL LOW (ref 98–111)
Creatinine, Ser: 1.35 mg/dL — ABNORMAL HIGH (ref 0.44–1.00)
GFR, Estimated: 41 mL/min — ABNORMAL LOW (ref >60)
Glucose, Bld: 373 mg/dL — ABNORMAL HIGH (ref 70–99)
Potassium: 3.7 mmol/L (ref 3.5–5.1)
Sodium: 132 mmol/L — ABNORMAL LOW (ref 135–145)

## 2022-09-26 LAB — GLUCOSE, CAPILLARY
Glucose-Capillary: 365 mg/dL — ABNORMAL HIGH (ref 70–99)
Glucose-Capillary: 300 mg/dL — ABNORMAL HIGH (ref 70–99)
Glucose-Capillary: 246 mg/dL — ABNORMAL HIGH (ref 70–99)
Glucose-Capillary: 285 mg/dL — ABNORMAL HIGH (ref 70–99)

## 2022-09-26 LAB — BRAIN NATRIURETIC PEPTIDE: B Natriuretic Peptide: 100.9 pg/mL — ABNORMAL HIGH (ref 0.0–100.0)

## 2022-09-26 LAB — PHOSPHORUS: Phosphorus: 3.9 mg/dL (ref 2.5–4.6)

## 2022-09-26 LAB — HEPATIC FUNCTION PANEL
ALT: 23 U/L (ref 0–44)
AST: 26 U/L (ref 15–41)
Albumin: 2.9 g/dL — ABNORMAL LOW (ref 3.5–5.0)
Alkaline Phosphatase: 40 U/L (ref 38–126)
Bilirubin, Direct: <0.1 mg/dL (ref 0.0–0.2)
Total Bilirubin: 0.5 mg/dL (ref 0.3–1.2)
Total Protein: 6.2 g/dL — ABNORMAL LOW (ref 6.5–8.1)

## 2022-09-26 LAB — MAGNESIUM: Magnesium: 1.8 mg/dL (ref 1.7–2.4)

## 2022-09-26 MED ORDER — INSULIN GLARGINE-YFGN 100 UNIT/ML ~~LOC~~ SOLN
35.0000 [IU] | Freq: Every day | SUBCUTANEOUS | Status: DC
  Filled 2022-09-26: qty 0.35

## 2022-09-26 MED ORDER — INSULIN ASPART 100 UNIT/ML IJ SOLN
6.0000 [IU] | Freq: Three times a day (TID) | INTRAMUSCULAR | Status: DC
  Administered 2022-09-26 – 2022-09-30 (×14): 6 [IU] via SUBCUTANEOUS

## 2022-09-26 MED ORDER — INSULIN GLARGINE-YFGN 100 UNIT/ML ~~LOC~~ SOLN
40.0000 [IU] | Freq: Every day | SUBCUTANEOUS | Status: DC
  Administered 2022-09-26: 40 [IU] via SUBCUTANEOUS
  Filled 2022-09-26 (×2): qty 0.4

## 2022-09-26 NOTE — Progress Notes (Signed)
PROGRESS NOTE    Tamara [name]  ZOX:[id number] DOB: [birth year] DOA: 09/23/2022 PCP: Margarita Mail, DO    Chief Complaint  Patient presents with   Shortness of Breath    Brief Narrative:   Tamara [name] is a 77 y.o. female with Past medical history of HTN, HLD, IDDM T2, COPD, hypothyroid, OSA, GERD, as reviewed from EMR, presented at [facility], ED with complaining of fever, worsening of shortness of breath and productive cough for 4 to 5 days.  Patient has been followed with pulmonary as an outpatient, where she has been treated with levofloxacin.  Her workup in ED significant for COVID-19 positive infection, her CTA chest was negative for PE, but with Extensive heterogeneous opacities throughout bilateral lungs with asymmetric more involvement of the left lung lower lobe, compatible with multilobar pneumonia.   Assessment & Plan:   Principal Problem:   Pneumonia due to COVID-19 virus  Acute respiratory failure with hypoxia Multilobar pneumonia COVID-19 pneumonia/infection Severe sepsis secondary to COVID-pneumonia Sepsis criteria, hypoxia, fever, leukopenia, tachypnea and pneumonia Continue supplemental O2 admission and gradually wean off, she remains on 3 L oxygen Started remdesivir, Continue Decadron 6 mg p.o. daily for 10 days giving her hypoxia PPI for GI prophylaxis Started Breo Ellipta inhaler, Incruse inhaler and albuterol inhaler Started Mucinex 600 mg p.o. twice daily, Robitussin DM as needed Continue with IV Rocephin and azithromycin to cover for bacterial pneumonia as well  -Pulmonary input greatly appreciated, cussed with SLP, likely they can perform MBS tomorrow  -Further workup per pulmonary, plan for PET scan on 9/16, but patient with evolving infiltrate, so they may consider proceeding with bronchoscopy when stable [facility] consult greatly appreciated     Hypokalemia,  - potassium repleted.  Hypophosphatemia, -  Phos  repleted.  Hypomagnesemia - mag repleted.  Hyponatremia -  continue to monitor   Pancytopenia - could be due to viral infection.   - Monitor CBC daily     IDDM T2 Difficultly uncontrolled, continue with Marcelline Deist, will hold her NPH insulin, started on Semglee, insulin sliding scale and Premeal NovoLog, dose uptitrated     HTN, HLD Resumed home meds aspirin, statin, Zetia, irbesartan Diuretics changed to Lasix 20 mg every 12 hourly Monitor BP and titrate medications accordingly Monitor urine output and renal functions   Hypothyroid, continued Synthroid  Diabetes mellitus, type II, insulin-dependent-CBG significantly uncontrolled hyperglycemia -BG significantly uncontrolled, secondary to steroid use, with acute infection, will change her insulin during hospital stay to [facility] for better control, will add insulin sliding scale.  CKD stage IIIa -Monitor renal function closely and avoid nephrotoxic medications     Nutrition: Carb modified diet   DVT prophylaxis: Lovenox Code Status: Full code Family Communication: None at bedside Disposition:   Status is: Inpatient  Consultants:  pulmonary   Subjective:  For generalized weakness, fatigue, she denies cough today  Objective: Vitals:   09/26/22 0555 09/26/22 0825 09/26/22 1000 09/26/22 1121  BP: (!) 106/57     Pulse: 80     Resp: (!) 21   (!) 22  Temp: 97.7 F (36.5 C) 98.6 F (37 C)    TempSrc: Oral Oral    SpO2: 94%  92%   Weight:      Height:        Intake/Output Summary (Last 24 hours) at 09/26/2022 1215 Last data filed at 09/26/2022 1029 Gross per 24 hour  Intake 1303 ml  Output --  Net 1303 ml   Filed Weights   09/23/22  1156  Weight: 104.8 kg    Examination:  Awake Alert, Oriented X 3, deconditioned Symmetrical Chest wall movement, Good air movement bilaterally, scattered Rales RRR,No Gallops,Rubs or new Murmurs, No Parasternal Heave +ve B.Sounds, Abd Soft, No tenderness, No rebound -  guarding or rigidity. No Cyanosis, Clubbing or edema, No new Rash or bruise       Data Reviewed: I have personally reviewed following labs and imaging studies  CBC: Recent Labs  Lab 09/23/22 1159 09/24/22 0453 09/25/22 1030 09/26/22 0719  WBC 3.0* 1.5* 3.8* 4.0  NEUTROABS 2.1  --   --   --   HGB 10.1* 8.8* 9.1* 8.9*  HCT 31.0* 27.1* 28.2* 27.4*  MCV 76.9* 76.8* 75.4* 74.7*  PLT 136* 107* 144* 149*    Basic Metabolic Panel: Recent Labs  Lab 09/23/22 1159 09/24/22 0453 09/25/22 1030 09/26/22 0719  NA 129* 129* 131* 132*  K 3.3* 3.9 3.6 3.7  CL 94* 95* 98 96*  CO2 23 22 21* 25  GLUCOSE 218* 438* 386* 373*  BUN 14 17 26* 29*  CREATININE 1.25* 1.21* 1.35* 1.35*  CALCIUM 8.6* 8.3* 8.5* 9.2  MG 1.3* 2.7* 2.0 1.8  PHOS 1.8* 5.5* 3.5 3.9    GFR: Estimated Creatinine Clearance: 44.1 mL/min (A) (by C-G formula based on SCr of 1.35 mg/dL (H)).  Liver Function Tests: Recent Labs  Lab 09/23/22 1159 09/24/22 0453 09/25/22 1030 09/26/22 0719  AST 45* 33 30 26  ALT 28 24 23 23   ALKPHOS 47 40 47 40  BILITOT 0.8 0.5 0.4 0.5  PROT 6.8 5.5* 6.0* 6.2*  ALBUMIN 3.3* 2.6* 2.8* 2.9*    CBG: Recent Labs  Lab 09/25/22 0851 09/25/22 1202 09/25/22 1651 09/25/22 2153 09/26/22 0925  GLUCAP 371* 345* 249* 339* 365*     Recent Results (from the past 240 hour(s))  Culture, blood (Routine x 2)     Status: None (Preliminary result)   Collection Time: 09/23/22 11:59 AM   Specimen: BLOOD LEFT ARM  Result Value Ref Range Status   Specimen Description BLOOD LEFT ARM  Final   Special Requests   Final    BOTTLES DRAWN AEROBIC AND ANAEROBIC Blood Culture adequate volume   Culture   Final    NO GROWTH 3 DAYS Performed at [facility], [address], [place], [place] [address]    Report Status PENDING  Incomplete  Resp panel by RT-PCR (RSV, Flu A&B, Covid) Anterior Nasal Swab     Status: Abnormal   Collection Time: 09/23/22 11:59 AM   Specimen: Anterior Nasal Swab   Result Value Ref Range Status   SARS Coronavirus 2 by RT PCR POSITIVE (A) NEGATIVE Final   Influenza A by PCR NEGATIVE NEGATIVE Final   Influenza B by PCR NEGATIVE NEGATIVE Final    Comment: (NOTE) The Xpert Xpress SARS-CoV-2/FLU/RSV plus assay is intended as an aid in the diagnosis of influenza from Nasopharyngeal swab specimens and should not be used as a sole basis for treatment. Nasal washings and aspirates are unacceptable for Xpert Xpress SARS-CoV-2/FLU/RSV testing.  Fact Sheet for Patients: BloggerCourse.com  Fact Sheet for Healthcare Providers: SeriousBroker.it  This test is not yet approved or cleared by the [place] FDA and has been authorized for detection and/or diagnosis of SARS-CoV-2 by FDA under an Emergency Use Authorization (EUA). This EUA will remain in effect (meaning this test can be used) for the duration of the COVID-19 declaration under Section 564(b)(1) of the Act, 21 U.S.C. section 360bbb-3(b)(1),  unless the authorization is terminated or revoked.     Resp Syncytial Virus by PCR NEGATIVE NEGATIVE Final    Comment: (NOTE) Fact Sheet for Patients: BloggerCourse.com  Fact Sheet for Healthcare Providers: SeriousBroker.it  This test is not yet approved or cleared by the [place] FDA and has been authorized for detection and/or diagnosis of SARS-CoV-2 by FDA under an Emergency Use Authorization (EUA). This EUA will remain in effect (meaning this test can be used) for the duration of the COVID-19 declaration under Section 564(b)(1) of the Act, 21 U.S.C. section 360bbb-3(b)(1), unless the authorization is terminated or revoked.  Performed at [facility], [address] [place], [place] [address]   Culture, blood (Routine x 2)     Status: None (Preliminary result)   Collection Time: 09/23/22  1:30 PM   Specimen: BLOOD RIGHT ARM  Result  Value Ref Range Status   Specimen Description BLOOD RIGHT ARM  Final   Special Requests   Final    BOTTLES DRAWN AEROBIC AND ANAEROBIC Blood Culture adequate volume   Culture   Final    NO GROWTH 3 DAYS Performed at [facility], [address], [place], [place] [address]    Report Status PENDING  Incomplete         Radiology Studies: No results found.      Scheduled Meds:  albuterol  3 mL Inhalation BID   vitamin C  500 mg Oral Daily   aspirin EC  81 mg Oral Daily   cholecalciferol  1,000 Units Oral Daily   dapagliflozin propanediol  5 mg Oral Daily   dexamethasone  6 mg Oral Daily   enoxaparin (LOVENOX) injection  40 mg Subcutaneous Q24H   ezetimibe  10 mg Oral Daily   fluticasone furoate-vilanterol  1 puff Inhalation Daily   furosemide  20 mg Intravenous Q12H   guaiFENesin  600 mg Oral BID   insulin aspart  0-20 Units Subcutaneous TID WC   insulin aspart  0-5 Units Subcutaneous QHS   insulin aspart  6 Units Subcutaneous TID WC   insulin glargine-yfgn  40 Units Subcutaneous Daily   irbesartan  75 mg Oral Daily   levothyroxine  50 mcg Oral Q0600   multivitamin with minerals  1 tablet Oral Daily   pantoprazole  40 mg Oral BID   sodium chloride flush  3 mL Intravenous Q12H   sodium chloride flush  3 mL Intravenous Q12H   umeclidinium bromide  1 puff Inhalation Daily   Continuous Infusions:  sodium chloride     azithromycin 500 mg (09/26/22 1031)   cefTRIAXone (ROCEPHIN)  IV 2 g (09/26/22 1029)     LOS: 3 days       Huey Bienenstock, MD [facility]   To contact the attending provider between 7A-7P or the covering provider during after hours 7P-7A, please log into the web site [email or web address] and access using universal [place] password for that web site. If you do not have the password, please call the hospital operator.  09/26/2022, 12:15 PM

## 2022-09-26 NOTE — Plan of Care (Signed)
  Problem: Education: Goal: Ability to describe self-care measures that may prevent or decrease complications (Diabetes Survival Skills Education) will improve Outcome: Progressing   Problem: Coping: Goal: Ability to adjust to condition or change in health will improve Outcome: Progressing   Problem: Fluid Volume: Goal: Ability to maintain a balanced intake and output will improve Outcome: Progressing   Problem: Metabolic: Goal: Ability to maintain appropriate glucose levels will improve Outcome: Progressing   Problem: Coping: Goal: Psychosocial and spiritual needs will be supported Outcome: Progressing   Problem: Respiratory: Goal: Will maintain a patent airway Outcome: Progressing

## 2022-09-26 NOTE — Plan of Care (Signed)
Patient feeling better less shortness of breath.

## 2022-09-27 ENCOUNTER — Encounter (HOSPITAL_COMMUNITY): Payer: Medicare HMO

## 2022-09-27 ENCOUNTER — Inpatient Hospital Stay (HOSPITAL_COMMUNITY): Payer: Medicare HMO

## 2022-09-27 ENCOUNTER — Inpatient Hospital Stay (HOSPITAL_COMMUNITY): Admission: RE | Admit: 2022-09-27 | Payer: Medicare HMO | Source: Ambulatory Visit

## 2022-09-27 DIAGNOSIS — R0902 Hypoxemia: Secondary | ICD-10-CM | POA: Diagnosis not present

## 2022-09-27 DIAGNOSIS — U071 COVID-19: Secondary | ICD-10-CM | POA: Diagnosis not present

## 2022-09-27 DIAGNOSIS — J189 Pneumonia, unspecified organism: Secondary | ICD-10-CM | POA: Diagnosis not present

## 2022-09-27 DIAGNOSIS — J1282 Pneumonia due to coronavirus disease 2019: Secondary | ICD-10-CM | POA: Diagnosis not present

## 2022-09-27 LAB — BASIC METABOLIC PANEL
Anion gap: 13 (ref 5–15)
BUN: 34 mg/dL — ABNORMAL HIGH (ref 8–23)
CO2: 23 mmol/L (ref 22–32)
Calcium: 9.1 mg/dL (ref 8.9–10.3)
Chloride: 97 mmol/L — ABNORMAL LOW (ref 98–111)
Creatinine, Ser: 1.27 mg/dL — ABNORMAL HIGH (ref 0.44–1.00)
GFR, Estimated: 44 mL/min — ABNORMAL LOW (ref >60)
Glucose, Bld: 293 mg/dL — ABNORMAL HIGH (ref 70–99)
Potassium: 3.8 mmol/L (ref 3.5–5.1)
Sodium: 133 mmol/L — ABNORMAL LOW (ref 135–145)

## 2022-09-27 LAB — CBC
HCT: 27.4 % — ABNORMAL LOW (ref 36.0–46.0)
Hemoglobin: 8.9 g/dL — ABNORMAL LOW (ref 12.0–15.0)
MCH: 24.3 pg — ABNORMAL LOW (ref 26.0–34.0)
MCHC: 32.5 g/dL (ref 30.0–36.0)
MCV: 74.9 fL — ABNORMAL LOW (ref 80.0–100.0)
Platelets: 161 10*3/uL (ref 150–400)
RBC: 3.66 MIL/uL — ABNORMAL LOW (ref 3.87–5.11)
RDW: 15.2 % (ref 11.5–15.5)
WBC: 3.9 10*3/uL — ABNORMAL LOW (ref 4.0–10.5)
nRBC: 0 % (ref 0.0–0.2)

## 2022-09-27 LAB — GLUCOSE, CAPILLARY
Glucose-Capillary: 313 mg/dL — ABNORMAL HIGH (ref 70–99)
Glucose-Capillary: 207 mg/dL — ABNORMAL HIGH (ref 70–99)
Glucose-Capillary: 294 mg/dL — ABNORMAL HIGH (ref 70–99)
Glucose-Capillary: 321 mg/dL — ABNORMAL HIGH (ref 70–99)

## 2022-09-27 MED ORDER — AZITHROMYCIN 500 MG PO TABS
500.0000 mg | ORAL_TABLET | Freq: Every day | ORAL | Status: AC
  Administered 2022-09-28: 500 mg via ORAL
  Filled 2022-09-27: qty 1

## 2022-09-27 MED ORDER — SALINE SPRAY 0.65 % NA SOLN
1.0000 | NASAL | Status: DC | PRN
  Filled 2022-09-27: qty 44

## 2022-09-27 MED ORDER — INSULIN GLARGINE-YFGN 100 UNIT/ML ~~LOC~~ SOLN
50.0000 [IU] | Freq: Every day | SUBCUTANEOUS | Status: DC
  Filled 2022-09-27: qty 0.5

## 2022-09-27 MED ORDER — FUROSEMIDE 10 MG/ML IJ SOLN
40.0000 mg | Freq: Every day | INTRAMUSCULAR | Status: DC
  Administered 2022-09-28 – 2022-09-30 (×3): 40 mg via INTRAVENOUS
  Filled 2022-09-27 (×3): qty 4

## 2022-09-27 MED ORDER — ALBUTEROL SULFATE (2.5 MG/3ML) 0.083% IN NEBU: 3.0000 mL | INHALATION_SOLUTION | Freq: Four times a day (QID) | RESPIRATORY_TRACT | Status: DC | PRN

## 2022-09-27 MED ORDER — INSULIN GLARGINE-YFGN 100 UNIT/ML ~~LOC~~ SOLN
60.0000 [IU] | Freq: Every day | SUBCUTANEOUS | Status: DC
  Administered 2022-09-27 – 2022-09-30 (×4): 60 [IU] via SUBCUTANEOUS
  Filled 2022-09-27 (×4): qty 0.6

## 2022-09-27 NOTE — Progress Notes (Signed)
NAME:  Tamara [name], MRN:  [id number], DOB:  Dec 10, 1945, LOS: 4 ADMISSION DATE:  09/23/2022, CONSULTATION DATE: 09/24/2022 REFERRING MD: Dr. Lucianne Muss, CHIEF COMPLAINT: Acute hypoxemia  History of Present Illness:  Tamara [name] is 13, known to me from the outpatient office for history of former tobacco, COPD with chronic cough and abnormal CT scan of the chest.  Also with a history of diabetes, hypertension, hyperlipidemia, GERD.  She had a rounded cavitary infiltrate on CT chest dating back to 04/16/2020.  This did not resolve after a month, antibiotic treatment, so she underwent bronchoscopy on 05/19/2020 that showed atypical cells but nondiagnostic for malignancy.  PET 06/25/2020 showed no hypermetabolism, consistent with possible rounded atelectasis.  Continued to be stable on subsequent CT scans of the chest January and July 2023, 01/25/2022.  Most recent CT chest 08/02/2022 showed the same cystic rounded area, question slightly enlarged, as well as a new masslike consolidation in the superior segment of the left lower lobe 4.5 x 5.4 cm.  Based on this a PET scan was ordered, pending on 9/16.   Most recently she has been seen in our office on 8/27 and then again on 9/5 for increased cough, dark sputum without hemoptysis.  Treated 8/27 with prednisone and levofloxacin.  Sent to the ED on 9/5 for persistent symptoms.  Admitted and found to be COVID-19 positive, flu negative.  CT-PA shows the known left lower lobe opacities as well as bilateral heterogeneous mixed density opacities suggestive of acute inflammatory process.  She is requiring 3 L/min, usually does not have an oxygen requirement.    Pertinent  Medical History   Past Medical History:  Diagnosis Date   Arthritis    knee and shoulders   Colon polyps 09/05/2017   COPD (chronic obstructive pulmonary disease) (HCC)    Diabetes mellitus without complication (HCC)    type II   Dyspnea    05/16/20 has had a cough for 2.5 years post Covid    Gastritis 09/05/2017   GERD (gastroesophageal reflux disease)    Hyperlipidemia    Hypertension    patient denies   Hypothyroidism    Panic attack    RUQ pain 03/09/2017   Per patient, she has had RUQ pain 4-5 years that has grown worse in the last few months.     Significant Hospital Events: Including procedures, antibiotic start and stop dates in addition to other pertinent events   Admission 09/23/2022 for COVID , requiring oxygen  Barium Swallow 9/9, Pending for today  Interim History / Subjective:  Pt. States she is feeling better. She is coughing up more secretions finally. She is mobilizing    Objective   Blood pressure (!) 118/50, pulse 72, temperature 98.2 F (36.8 C), resp. rate (!) 21, height 5\' 6"  (1.676 m), weight 104.8 kg, SpO2 95%.        Intake/Output Summary (Last 24 hours) at 09/27/2022 1130 Last data filed at 09/26/2022 2200 Gross per 24 hour  Intake 1540 ml  Output 500 ml  Net 1040 ml   Filed Weights   09/23/22 1156  Weight: 104.8 kg    Examination: General: Obese woman, sitting in chair, comfortable on nasal cannula oxygen HENT: Strong voice, no secretions, no stridor, cough Lungs: Bilateral chest excursion, few basilar crackles, diminished per bases Cardiovascular: Regular, no murmur, rub or gallop Abdomen: Obese, nondistended with positive bowel sounds Extremities: No significant edema, no obvious deformities Neuro: Awake, alert, interacting appropriately, follows commands  Resolved Hospital Problem list  Assessment & Plan:   Acute hypoxemic respiratory failure with acute bilateral pulmonary infiltrates COVID-19 pneumonia More chronic focal rounded left lower lobe infiltrates, question recurrent pneumonias.  Question chronic aspiration into LLL COPD Chronic cough, multifactorial due to all the above plus GERD, rhinitis -Continue  empiric antibiotics for possible CAP although  suspect that her most recent symptoms have been related to the  COVID-19 -Continue  with dexamethasone for 10 days -Continue  remdesivir as ordered -Incruse and Breo (recently changed back from [place] to [employer] as an outpatient) -Push pulmonary hygiene, cough suppression, mobilization , OOB Dr. Delton Coombes  had planned for her to have a modified barium swallow as an outpatient.  It has been scheduled today 9/9  to rule out chronic aspiration as a cause for her left lower lobe infiltrates. -Dr. Delton Coombes  had planned for an outpatient PET scan on 10/04/2022.  Given the evolving infiltrates need to consider proceeding with bronchoscopy when stable from this acute illness.  I did discuss this with her briefly, and told her it would be when she is a bit better.  - CXR prn   Labs   CBC: Recent Labs  Lab 09/23/22 1159 09/24/22 0453 09/25/22 1030 09/26/22 0719 09/27/22 0532  WBC 3.0* 1.5* 3.8* 4.0 3.9*  NEUTROABS 2.1  --   --   --   --   HGB 10.1* 8.8* 9.1* 8.9* 8.9*  HCT 31.0* 27.1* 28.2* 27.4* 27.4*  MCV 76.9* 76.8* 75.4* 74.7* 74.9*  PLT 136* 107* 144* 149* 161    Basic Metabolic Panel: Recent Labs  Lab 09/23/22 1159 09/24/22 0453 09/25/22 1030 09/26/22 0719 09/27/22 0532  NA 129* 129* 131* 132* 133*  K 3.3* 3.9 3.6 3.7 3.8  CL 94* 95* 98 96* 97*  CO2 23 22 21* 25 23  GLUCOSE 218* 438* 386* 373* 293*  BUN 14 17 26* 29* 34*  CREATININE 1.25* 1.21* 1.35* 1.35* 1.27*  CALCIUM 8.6* 8.3* 8.5* 9.2 9.1  MG 1.3* 2.7* 2.0 1.8  --   PHOS 1.8* 5.5* 3.5 3.9  --    GFR: Estimated Creatinine Clearance: 46.8 mL/min (A) (by C-G formula based on SCr of 1.27 mg/dL (H)). Recent Labs  Lab 09/23/22 1159 09/23/22 1218 09/23/22 1431 09/24/22 0448 09/24/22 0453 09/25/22 1030 09/26/22 0719 09/27/22 0532  PROCALCITON 0.15  --   --  0.14  --   --   --   --   WBC 3.0*  --   --   --  1.5* 3.8* 4.0 3.9*  LATICACIDVEN  --  1.6 1.5  --   --   --   --   --     Liver Function Tests: Recent Labs  Lab 09/23/22 1159 09/24/22 0453 09/25/22 1030 09/26/22 0719   AST 45* 33 30 26  ALT 28 24 23 23   ALKPHOS 47 40 47 40  BILITOT 0.8 0.5 0.4 0.5  PROT 6.8 5.5* 6.0* 6.2*  ALBUMIN 3.3* 2.6* 2.8* 2.9*   No results for input(s): "LIPASE", "AMYLASE" in the last 168 hours. No results for input(s): "AMMONIA" in the last 168 hours.  ABG No results found for: "PHART", "PCO2ART", "PO2ART", "HCO3", "TCO2", "ACIDBASEDEF", "O2SAT"   Coagulation Profile: Recent Labs  Lab 09/23/22 1159  INR 1.2    Cardiac Enzymes: No results for input(s): "CKTOTAL", "CKMB", "CKMBINDEX", "TROPONINI" in the last 168 hours.  HbA1C: Hemoglobin A1C  Date/Time Value Ref Range Status  06/10/2022 02:39 PM 7.7 (A) 4.0 - 5.6 % Final  11/16/2021 01:57  PM 7.7 (A) 4.0 - 5.6 % Final   Hgb A1c MFr Bld  Date/Time Value Ref Range Status  09/23/2022 11:59 AM 8.8 (H) 4.8 - 5.6 % Final    Comment:    (NOTE) Pre diabetes:          5.7%-6.4%  Diabetes:              >6.4%  Glycemic control for   <7.0% adults with diabetes   03/22/2019 10:20 AM 10.8 (H) <5.7 % of total Hgb Final    Comment:    For someone without known diabetes, a hemoglobin A1c value of 6.5% or greater indicates that they may have  diabetes and this should be confirmed with a follow-up  test. . For someone with known diabetes, a value <7% indicates  that their diabetes is well controlled and a value  greater than or equal to 7% indicates suboptimal  control. A1c targets should be individualized based on  duration of diabetes, age, comorbid conditions, and  other considerations. . Currently, no consensus exists regarding use of hemoglobin A1c for diagnosis of diabetes for children. .     CBG: Recent Labs  Lab 09/26/22 0925 09/26/22 1246 09/26/22 1536 09/26/22 2159 09/27/22 0746  GLUCAP 365* 300* 246* 285* 313*    Review of Systems:   As Per HPI  Past Medical History:  She,  has a past medical history of Arthritis, Colon polyps (09/05/2017), COPD (chronic obstructive pulmonary disease)  (HCC), Diabetes mellitus without complication (HCC), Dyspnea, Gastritis (09/05/2017), GERD (gastroesophageal reflux disease), Hyperlipidemia, Hypertension, Hypothyroidism, Panic attack, and RUQ pain (03/09/2017).   Surgical History:   Past Surgical History:  Procedure Laterality Date   BREAST BIOPSY Left 2008   CORE W/CLIP - NEG   BRONCHIAL BIOPSY  05/19/2020   Procedure: BRONCHIAL BIOPSIES;  Surgeon: Leslye Peer, MD;  Location: [facility] ENDOSCOPY;  Service: Pulmonary;;   BRONCHIAL BRUSHINGS  05/19/2020   Procedure: BRONCHIAL BRUSHINGS;  Surgeon: Leslye Peer, MD;  Location: [facility] ENDOSCOPY;  Service: Pulmonary;;   BRONCHIAL NEEDLE ASPIRATION BIOPSY  05/19/2020   Procedure: BRONCHIAL NEEDLE ASPIRATION BIOPSIES;  Surgeon: Leslye Peer, MD;  Location: [facility] ENDOSCOPY;  Service: Pulmonary;;   BRONCHIAL WASHINGS  05/19/2020   Procedure: BRONCHIAL WASHINGS;  Surgeon: Leslye Peer, MD;  Location: [facility] ENDOSCOPY;  Service: Pulmonary;;   COLONOSCOPY WITH PROPOFOL N/A 02/08/2017   Procedure: COLONOSCOPY WITH PROPOFOL;  Surgeon: Christena Deem, MD;  Location: [facility] ENDOSCOPY;  Service: Endoscopy;  Laterality: N/A;   COLONOSCOPY WITH PROPOFOL N/A 05/05/2022   Procedure: COLONOSCOPY WITH PROPOFOL;  Surgeon: Toney Reil, MD;  Location: [facility] ENDOSCOPY;  Service: Gastroenterology;  Laterality: N/A;   ESOPHAGOGASTRODUODENOSCOPY (EGD) WITH PROPOFOL N/A 02/08/2017   Procedure: ESOPHAGOGASTRODUODENOSCOPY (EGD) WITH PROPOFOL;  Surgeon: Christena Deem, MD;  Location[facility] ENDOSCOPY;  Service: Endoscopy;  Laterality: N/A;   TUBAL LIGATION     VIDEO BRONCHOSCOPY WITH ENDOBRONCHIAL NAVIGATION N/A 05/19/2020   Procedure: VIDEO BRONCHOSCOPY WITH ENDOBRONCHIAL NAVIGATION;  Surgeon: Leslye Peer, MD;  Location: [facility] ENDOSCOPY;  Service: Pulmonary;  Laterality: N/A;     Social History:   reports that she quit smoking about 18 years ago. Her smoking use included cigarettes. She started smoking about 58 years ago.  She has a 40 pack-year smoking history. She has never used smokeless tobacco. She reports that she does not drink alcohol and does not use drugs.   Family History:  Her family history includes Diabetes in her maternal grandmother. There is  no history of Breast cancer.   Allergies Allergies  Allergen Reactions   Penicillins Hives    Reaction: 1964     Home Medications  Prior to Admission medications   Medication Sig Start Date End Date Taking? Authorizing Provider  albuterol (PROVENTIL) (2.5 MG/3ML) 0.083% nebulizer solution Take 3 mLs (2.5 mg total) by nebulization every 6 (six) hours as needed for wheezing or shortness of breath. 03/01/22 03/01/23 Yes Margarita Mail, DO  albuterol (VENTOLIN HFA) 108 (90 Base) MCG/ACT inhaler Inhale 2 puffs into the lungs every 6 (six) hours as needed for wheezing or shortness of breath. 09/17/20  Yes Leslye Peer, MD  Ascorbic Acid (VITAMIN C) 1000 MG tablet Take 1,000 mg by mouth daily.   Yes [provider]  aspirin EC 81 MG tablet Take 1 tablet (81 mg total) by mouth daily. Swallow whole. 05/18/22  Yes Meriam Sprague, MD  Cholecalciferol (VITAMIN D) 50 MCG (2000 UT) CAPS Take 1,000 Units by mouth daily.   Yes [provider]  dapagliflozin propanediol (FARXIGA) 5 MG TABS tablet Take 1 tablet (5 mg total) by mouth daily before breakfast. 11/27/21  Yes Carlus Pavlov, MD  ezetimibe (ZETIA) 10 MG tablet Take 1 tablet (10 mg total) by mouth daily. 02/16/22  Yes Flossie Dibble, NP  furosemide (LASIX) 40 MG tablet Take 1 tablet (40 mg total) by mouth daily. Patient taking differently: Take 40 mg by mouth as needed for edema. 05/31/22  Yes Danelle Berry, PA-C  gabapentin (NEURONTIN) 300 MG capsule Take 300 mg by mouth as needed. 10/16/21  Yes [provider]  hydrochlorothiazide (HYDRODIURIL) 25 MG tablet TAKE 1 TABLET EVERY DAY 05/31/22  Yes Margarita Mail, DO  Insulin NPH, Human,, Isophane, (NOVOLIN N FLEXPEN) 100  UNIT/ML Kiwkpen 36 units in am and 28-30 units at bedtime 07/05/22  Yes Carlus Pavlov, MD  levofloxacin (LEVAQUIN) 500 MG tablet Take 1 tablet (500 mg total) by mouth daily. 09/14/22  Yes Kalman Shan, MD  levothyroxine (SYNTHROID) 50 MCG tablet TAKE 1 TABLET EVERY DAY BEFORE BREAKFAST 07/26/22  Yes Margarita Mail, DO  meloxicam (MOBIC) 15 MG tablet TAKE 1/2 TO 1 TABLET EVERY DAY AS NEEDED FOR PAIN 09/09/22  Yes Margarita Mail, DO  metFORMIN (GLUCOPHAGE) 1000 MG tablet Take 1 tablet (1,000 mg total) by mouth 2 (two) times daily. 06/10/22  Yes Carlus Pavlov, MD  nystatin (MYCOSTATIN/NYSTOP) powder Apply 1 Application topically daily as needed (yeast under belly). 03/01/22  Yes Margarita Mail, DO  pantoprazole (PROTONIX) 40 MG tablet TAKE 1 TABLET TWICE DAILY 12/14/21  Yes Byrum, Les Pou, MD  potassium chloride (KLOR-CON M) 10 MEQ tablet Take 1 tablet (10 mEq total) by mouth daily. 05/18/22  Yes Meriam Sprague, MD  rosuvastatin (CRESTOR) 20 MG tablet TAKE 1 TABLET AT BEDTIME 07/26/22  Yes Margarita Mail, DO  spironolactone (ALDACTONE) 25 MG tablet Take 0.5 tablets (12.5 mg total) by mouth daily. 05/18/22 09/23/22 Yes Meriam Sprague, MD  Tiotropium Bromide-Olodaterol (STIOLTO RESPIMAT) 2.5-2.5 MCG/ACT AERS Inhale 2 puffs into the lungs daily. 09/08/22  Yes Leslye Peer, MD  valsartan (DIOVAN) 80 MG tablet TAKE 1 TABLET (80 MG TOTAL) BY MOUTH DAILY. 05/12/22  Yes Berniece Salines, FNP  Accu-Chek Softclix Lancets lancets TEST BLOOD SUGAR TWO TIMES DAILY 07/28/22   Margarita Mail, DO  Alcohol Swabs (DROPSAFE ALCOHOL PREP) 70 % PADS USE AS DIRECTED TWICE DAILY WHEN TESTING BLOOD SUGAR 06/27/22   Carlus Pavlov, MD  DROPLET PEN NEEDLES 32G X 4 MM  MISC 1 Device by Other route daily. 06/10/22   Carlus Pavlov, MD  predniSONE (DELTASONE) 10 MG tablet Take 10 mg by mouth See admin instructions. TAKE 4 TABLETS BY MOUTH EVERY MORNING FOR ONE DAY, THEN TAKE THREE TABLETS BY MOUTH  EVERY MORNING FOR ONE DAY,THEN TAKE TWO TABLETS BY MOUTH FOR ONE DAY,THEN TAKE ONE TABLET BY MOUTH EVERY MORNING FOR ONE DAY THEN TAKE 1/2 TABLET BY MOUTH FOR ONE DAY Patient not taking: Reported on 09/23/2022    [provider]  TRUE METRIX BLOOD GLUCOSE TEST test strip Use 2x a day 06/10/22   Carlus Pavlov, MD     Critical care time: NA     Bevelyn Ngo AGACNP 09/27/2022, 11:30 AM [facility] [phone] or if no answer before 7:00PM call [phone] For any issues after 7:00PM please call eLink [phone]

## 2022-09-27 NOTE — Progress Notes (Signed)
PROGRESS NOTE    BARBARAJEAN [name]  XBJ:[id number] DOB: [birth year] DOA: 09/23/2022 PCP: Margarita Mail, DO    Chief Complaint  Patient presents with   Shortness of Breath    Brief Narrative:   Tamara [name] is a 77 y.o. female with Past medical history of HTN, HLD, IDDM T2, COPD, hypothyroid, OSA, GERD, as reviewed from EMR, presented at [facility], ED with complaining of fever, worsening of shortness of breath and productive cough for 4 to 5 days.  Patient has been followed with pulmonary as an outpatient, where she has been treated with levofloxacin.  Her workup in ED significant for COVID-19 positive infection, her CTA chest was negative for PE, but with Extensive heterogeneous opacities throughout bilateral lungs with asymmetric more involvement of the left lung lower lobe, compatible with multilobar pneumonia.   Assessment & Plan:   Principal Problem:   Pneumonia due to COVID-19 virus  Acute respiratory failure with hypoxia Multilobar pneumonia COVID-19 pneumonia/infection Severe sepsis secondary to COVID-pneumonia Sepsis criteria, hypoxia, fever, leukopenia, tachypnea and pneumonia Continue supplemental O2 admission and gradually wean off, she remains on 3 L oxygen Started remdesivir, Continue Decadron 6 mg p.o. daily for 10 days giving her hypoxia PPI for GI prophylaxis Started Breo Ellipta inhaler, Incruse inhaler and albuterol inhaler Started Mucinex 600 mg p.o. twice daily, Robitussin DM as needed Continue with IV Rocephin and azithromycin to cover for bacterial pneumonia as well, to finish 5 days -Pulmonary input greatly appreciated, discussed with SLP, plan for MBS today as discussed with SLP service -Further workup per pulmonary, plan for PET scan on 9/16, but patient with evolving infiltrate, so they may consider proceeding with bronchoscopy when stable -pulmonary consult greatly appreciated     Hypokalemia,  - potassium repleted.  Hypophosphatemia, -   Phos repleted.  Hypomagnesemia - mag repleted.  Hyponatremia -  continue to monitor   Pancytopenia - could be due to viral infection.   - Monitor CBC daily     IDDM T2 - Very difficult to control, she has been refusing [place] -She is on insulin NPH at home, she has been transitioned to [facility] during hospital stay for better control, CBG remains significantly uncontrolled, will increase her Semglee to 60 units today as she required 41 units of short acting NovoLog for coverage yesterday.   HTN, HLD Resumed home meds aspirin, statin, Zetia, irbesartan Diuretics changed to Lasix 20 mg every 12 hourly Monitor BP and titrate medications accordingly Monitor urine output and renal functions   Hypothyroid, - continued Synthroid   CKD stage IIIa -Monitor renal function closely and avoid nephrotoxic medications     Nutrition: Carb modified diet   DVT prophylaxis: Lovenox Code Status: Full code Family Communication: None at bedside Disposition:   Status is: Inpatient  Consultants:  pulmonary   Subjective:  Ports she is feeling better today, reports productive phlegm today Objective: Vitals:   09/26/22 1950 09/26/22 2035 09/27/22 0440 09/27/22 0749  BP: (!) 110/50  (!) 118/50   Pulse: 92  72   Resp: (!) 24  (!) 21   Temp: 98 F (36.7 C)  98.2 F (36.8 C)   TempSrc: Oral   Oral  SpO2: 94% 94% 95%   Weight:      Height:        Intake/Output Summary (Last 24 hours) at 09/27/2022 1357 Last data filed at 09/26/2022 2200 Gross per 24 hour  Intake 1020 ml  Output 500 ml  Net 520 ml   Filed  Weights   09/23/22 1156  Weight: 104.8 kg    Examination:  Awake Alert, Oriented X 3,  Symmetrical Chest wall movement, Good air movement bilaterally, no wheezing today RRR,No Gallops,Rubs or new Murmurs, No Parasternal Heave +ve B.Sounds, Abd Soft, No tenderness, No rebound - guarding or rigidity. No Cyanosis, Clubbing or edema, No new Rash or bruise      Data  Reviewed: I have personally reviewed following labs and imaging studies  CBC: Recent Labs  Lab 09/23/22 1159 09/24/22 0453 09/25/22 1030 09/26/22 0719 09/27/22 0532  WBC 3.0* 1.5* 3.8* 4.0 3.9*  NEUTROABS 2.1  --   --   --   --   HGB 10.1* 8.8* 9.1* 8.9* 8.9*  HCT 31.0* 27.1* 28.2* 27.4* 27.4*  MCV 76.9* 76.8* 75.4* 74.7* 74.9*  PLT 136* 107* 144* 149* 161    Basic Metabolic Panel: Recent Labs  Lab 09/23/22 1159 09/24/22 0453 09/25/22 1030 09/26/22 0719 09/27/22 0532  NA 129* 129* 131* 132* 133*  K 3.3* 3.9 3.6 3.7 3.8  CL 94* 95* 98 96* 97*  CO2 23 22 21* 25 23  GLUCOSE 218* 438* 386* 373* 293*  BUN 14 17 26* 29* 34*  CREATININE 1.25* 1.21* 1.35* 1.35* 1.27*  CALCIUM 8.6* 8.3* 8.5* 9.2 9.1  MG 1.3* 2.7* 2.0 1.8  --   PHOS 1.8* 5.5* 3.5 3.9  --     GFR: Estimated Creatinine Clearance: 46.8 mL/min (A) (by C-G formula based on SCr of 1.27 mg/dL (H)).  Liver Function Tests: Recent Labs  Lab 09/23/22 1159 09/24/22 0453 09/25/22 1030 09/26/22 0719  AST 45* 33 30 26  ALT 28 24 23 23   ALKPHOS 47 40 47 40  BILITOT 0.8 0.5 0.4 0.5  PROT 6.8 5.5* 6.0* 6.2*  ALBUMIN 3.3* 2.6* 2.8* 2.9*    CBG: Recent Labs  Lab 09/26/22 1246 09/26/22 1536 09/26/22 2159 09/27/22 0746 09/27/22 1256  GLUCAP 300* 246* 285* 313* 207*     Recent Results (from the past 240 hour(s))  Culture, blood (Routine x 2)     Status: None (Preliminary result)   Collection Time: 09/23/22 11:59 AM   Specimen: BLOOD LEFT ARM  Result Value Ref Range Status   Specimen Description BLOOD LEFT ARM  Final   Special Requests   Final    BOTTLES DRAWN AEROBIC AND ANAEROBIC Blood Culture adequate volume   Culture   Final    NO GROWTH 4 DAYS Performed at [facility], [address] [place], [place] [address]    Report Status PENDING  Incomplete  Resp panel by RT-PCR (RSV, Flu A&B, Covid) Anterior Nasal Swab     Status: Abnormal   Collection Time: 09/23/22 11:59 AM   Specimen: Anterior  Nasal Swab  Result Value Ref Range Status   SARS Coronavirus 2 by RT PCR POSITIVE (A) NEGATIVE Final   Influenza A by PCR NEGATIVE NEGATIVE Final   Influenza B by PCR NEGATIVE NEGATIVE Final    Comment: (NOTE) The Xpert Xpress SARS-CoV-2/FLU/RSV plus assay is intended as an aid in the diagnosis of influenza from Nasopharyngeal swab specimens and should not be used as a sole basis for treatment. Nasal washings and aspirates are unacceptable for Xpert Xpress SARS-CoV-2/FLU/RSV testing.  Fact Sheet for Patients: BloggerCourse.com  Fact Sheet for Healthcare Providers: SeriousBroker.it  This test is not yet approved or cleared by the [place] FDA and has been authorized for detection and/or diagnosis of SARS-CoV-2 by FDA under an Emergency Use Authorization (  EUA). This EUA will remain in effect (meaning this test can be used) for the duration of the COVID-19 declaration under Section 564(b)(1) of the Act, 21 U.S.C. section 360bbb-3(b)(1), unless the authorization is terminated or revoked.     Resp Syncytial Virus by PCR NEGATIVE NEGATIVE Final    Comment: (NOTE) Fact Sheet for Patients: BloggerCourse.com  Fact Sheet for Healthcare Providers: SeriousBroker.it  This test is not yet approved or cleared by the [place] FDA and has been authorized for detection and/or diagnosis of SARS-CoV-2 by FDA under an Emergency Use Authorization (EUA). This EUA will remain in effect (meaning this test can be used) for the duration of the COVID-19 declaration under Section 564(b)(1) of the Act, 21 U.S.C. section 360bbb-3(b)(1), unless the authorization is terminated or revoked.  Performed at [facility], [address], [place], [place] [address]   Culture, blood (Routine x 2)     Status: None (Preliminary result)   Collection Time: 09/23/22  1:30 PM   Specimen: BLOOD RIGHT ARM   Result Value Ref Range Status   Specimen Description BLOOD RIGHT ARM  Final   Special Requests   Final    BOTTLES DRAWN AEROBIC AND ANAEROBIC Blood Culture adequate volume   Culture   Final    NO GROWTH 4 DAYS Performed at [facility], [address] [place], [place] [address]    Report Status PENDING  Incomplete         Radiology Studies: No results found.      Scheduled Meds:  vitamin C  500 mg Oral Daily   aspirin EC  81 mg Oral Daily   cholecalciferol  1,000 Units Oral Daily   dapagliflozin propanediol  5 mg Oral Daily   dexamethasone  6 mg Oral Daily   enoxaparin (LOVENOX) injection  40 mg Subcutaneous Q24H   ezetimibe  10 mg Oral Daily   fluticasone furoate-vilanterol  1 puff Inhalation Daily   [START ON 09/28/2022] furosemide  40 mg Intravenous Daily   guaiFENesin  600 mg Oral BID   insulin aspart  0-20 Units Subcutaneous TID WC   insulin aspart  0-5 Units Subcutaneous QHS   insulin aspart  6 Units Subcutaneous TID WC   insulin glargine-yfgn  60 Units Subcutaneous Daily   irbesartan  75 mg Oral Daily   levothyroxine  50 mcg Oral Q0600   multivitamin with minerals  1 tablet Oral Daily   pantoprazole  40 mg Oral BID   sodium chloride flush  3 mL Intravenous Q12H   sodium chloride flush  3 mL Intravenous Q12H   umeclidinium bromide  1 puff Inhalation Daily   Continuous Infusions:  sodium chloride     azithromycin 500 mg (09/27/22 0930)   cefTRIAXone (ROCEPHIN)  IV 2 g (09/27/22 0929)     LOS: 4 days       Huey Bienenstock, MD [facility]   To contact the attending provider between 7A-7P or the covering provider during after hours 7P-7A, please log into the web site [email or web address] and access using universal [place] password for that web site. If you do not have the password, please call the hospital operator.  09/27/2022, 1:57 PM

## 2022-09-27 NOTE — Progress Notes (Signed)
SATURATION QUALIFICATIONS: (This note is used to comply with regulatory documentation for home oxygen)  Patient Saturations on Room Air at Rest = 85%  Patient Saturations on Room Air while Ambulating = NA; desaturated at rest on room air  Patient Saturations on 3 Liters of oxygen while Ambulating = 85%; recovers to 88% with 2 minute seated rest break with pursed lip breathing.   Please briefly explain why patient needs home oxygen:  To maintain oxygen levels to allow functional mobility.    Jerolyn Center, PT Acute Rehabilitation Services  Office [phone]

## 2022-09-27 NOTE — Progress Notes (Signed)
Modified Barium Swallow Study  Patient Details  Name: Tamara [name] MRN: [id number] Date of Birth: Aug 22, 1945  Today's Date: 09/27/2022  Modified Barium Swallow completed.  Full report located under Chart Review in the Imaging Section.  History of Present Illness 77 y.o. female presented at [facility], ED with complaining of fever, worsening of shortness of breath and productive cough. +COVID pna,  Past medical history of HTN, HLD, IDDM T2, COPD, hypothyroid, OSA, GERD, cavitary lung mass   Clinical Impression Pt demonstrated normal oropharyngeal swallow function. Possible esophageal dysphagia is suspected. She coughed once prior to po's likely from Covid diagnosis. Her tongue control, bolus preparation/mastication and transport were all within functional limits. Swallows were initiated timely with adequate strength to prevent aspiration. During pill consumption there was one trace, flash penetration of thin liquid that exited vestibule during the swallow. All boluses moved through her pharynx into upper esophageal sphincter without any residual. The pill stopped at the GE junction and did not move into the stomach given additional thin liquid. There was mild, possibly moderate residue seen in her esophagus at end of study which may account for her complaint of coughing at the end of meals. Recommend continue regular texture, thin liquids, pills with thin, stay sitting upright after meals, alternate liquids and solids and therapist educated pt on strategies. Pt states she sleeps in a reclined position at home due to her reflux. No further ST warranted at this time. Factors that may increase risk of adverse event in presence of aspiration [name] & [name] 2021):    Swallow Evaluation Recommendations Recommendations: PO diet PO Diet Recommendation: Regular;Thin liquids (Level 0) Liquid Administration via: Cup;Straw Medication Administration: Whole meds with liquid Supervision: Patient able to  self-feed Swallowing strategies  : Small bites/sips;Slow rate Postural changes: Stay upright 30-60 min after meals;Position pt fully upright for meals Oral care recommendations: Oral care BID (2x/day)      [name] 09/27/2022,3:07 PM

## 2022-09-27 NOTE — Evaluation (Signed)
Physical Therapy Evaluation and Discharge Patient Details Name: Tamara [name] MRN: [id number] DOB: [birth year] Today's Date: 09/27/2022  History of Present Illness  77 y.o. female presented at [facility], ED with complaining of fever, worsening of shortness of breath and productive cough.   +COVID pna,  Past medical history of HTN, HLD, IDDM T2, COPD, hypothyroid, OSA, GERD  Clinical Impression   Patient evaluated by Physical Therapy with no further acute PT needs identified. All education has been completed and the patient has no further questions (pursed lip breathing with activity, managing O2 tubing to/from bathroom). See ambulatory oxygen saturation note separately.  PT is signing off. Thank you for this referral.         If plan is discharge home, recommend the following:     Can travel by private vehicle        Equipment Recommendations None recommended by PT  Recommendations for Other Services  OT consult (energy conservation)    Functional Status Assessment Patient has not had a recent decline in their functional status     Precautions / Restrictions Precautions Precautions: Other (comment) Precaution Comments: drops sats on 3L      Mobility  Bed Mobility               General bed mobility comments: up in recliner    Transfers Overall transfer level: Independent Equipment used: None                    Ambulation/Gait Ambulation/Gait assistance: Independent Gait Distance (Feet): 20 Feet (x2 (to/from bathroom)) Assistive device: None Gait Pattern/deviations: WFL(Within Functional Limits)   Gait velocity interpretation: 1.31 - 2.62 ft/sec, indicative of limited community ambulator   General Gait Details: pt able to manage O2 extension tubing without difficulty; cues to slow down and use pursed lip breathing  Stairs            Wheelchair Mobility     Tilt Bed    Modified Rankin (Stroke Patients Only)       Balance Overall  balance assessment: Independent                                           Pertinent Vitals/Pain Pain Assessment Pain Assessment: No/denies pain    Home Living Family/patient expects to be discharged to:: Private residence Living Arrangements: Spouse/significant other Available Help at Discharge: Family;Available 24 hours/day Type of Home: Mobile home Home Access: Ramped entrance       Home Layout: One level Home Equipment: Cane - single Librarian, academic (2 wheels)      Prior Function Prior Level of Function : Independent/Modified Independent             Mobility Comments: no use of assistive device       Extremity/Trunk Assessment   Upper Extremity Assessment Upper Extremity Assessment: Defer to OT evaluation    Lower Extremity Assessment Lower Extremity Assessment: Overall WFL for tasks assessed    Cervical / Trunk Assessment Cervical / Trunk Assessment: Other exceptions Cervical / Trunk Exceptions: overweight  Communication   Communication Communication: No apparent difficulties Cueing Techniques: Verbal cues;Visual cues  Cognition Arousal: Alert Behavior During Therapy: WFL for tasks assessed/performed Overall Cognitive Status: Within Functional Limits for tasks assessed  General Comments General comments (skin integrity, edema, etc.): on 3L with sats 90% at rest; down to 85% with ambulation requiring 2 minutes seated rest with instruction in PLB to recover to 88%    Exercises Other Exercises Other Exercises: pt demonstrated correct use of flutter valve x 10; vc for correct use of IS (to slow down) x 10 reps   Assessment/Plan    PT Assessment Patient does not need any further PT services  PT Problem List         PT Treatment Interventions      PT Goals (Current goals can be found in the Care Plan section)  Acute Rehab PT Goals Patient Stated Goal: go home soon PT  Goal Formulation: All assessment and education complete, DC therapy    Frequency       Co-evaluation               AM-PAC PT "6 Clicks" Mobility  Outcome Measure Help needed turning from your back to your side while in a flat bed without using bedrails?: None Help needed moving from lying on your back to sitting on the side of a flat bed without using bedrails?: None Help needed moving to and from a bed to a chair (including a wheelchair)?: None Help needed standing up from a chair using your arms (e.g., wheelchair or bedside chair)?: None Help needed to walk in hospital room?: None Help needed climbing 3-5 steps with a railing? : None 6 Click Score: 24    End of Session Equipment Utilized During Treatment: Oxygen Activity Tolerance: Treatment limited secondary to medical complications (Comment) (desaturates) Patient left: in chair;with call bell/phone within reach Nurse Communication: Other (comment) (desats with ambulation) PT Visit Diagnosis: Difficulty in walking, not elsewhere classified (R26.2)    Time: 7829-5621 PT Time Calculation (min) (ACUTE ONLY): 26 min   Charges:   PT Evaluation $PT Eval Low Complexity: 1 Low PT Treatments $Therapeutic Exercise: 8-22 mins PT General Charges $$ ACUTE PT VISIT: 1 Visit          Jerolyn Center, PT Acute Rehabilitation Services  Office [phone]   Zena Amos 09/27/2022, 11:29 AM

## 2022-09-27 NOTE — Evaluation (Signed)
Occupational Therapy Evaluation Patient Details Name: Tamara [name] MRN: [id number] DOB: Sep 07, 1945 Today's Date: 09/27/2022   History of Present Illness 77 y.o. female presented at [facility], ED with complaining of fever, worsening of shortness of breath and productive cough.   +COVID pna,  Past medical history of HTN, HLD, IDDM T2, COPD, hypothyroid, OSA, GERD   Clinical Impression   PTA pt lives at home independently with her husband, who is recovering from Covid as well. Pt demonstrated 1 LOB during mobility to the bathroom. Easily fatigues with activity and would benefit from further education on AE/DME for ADL and energy conservation and fall prevention strategies.  VSS on 3L during ADL task with 2/4 DOE. Acute OT to follow.       If plan is discharge home, recommend the following: A little help with bathing/dressing/bathroom;Assistance with cooking/housework    Functional Status Assessment  Patient has had a recent decline in their functional status and demonstrates the ability to make significant improvements in function in a reasonable and predictable amount of time.  Equipment Recommendations  None recommended by OT    Recommendations for Other Services       Precautions / Restrictions Precautions Precautions: Fall Precaution Comments: watch O2 sats      Mobility Bed Mobility Overal bed mobility: Needs Assistance Bed Mobility: Supine to Sit     Supine to sit: Supervision     General bed mobility comments: moving OOB is difficult; uses her adjustable bed at home    Transfers Overall transfer level: Independent Equipment used: None                      Balance Overall balance assessment: Needs assistance   Sitting balance-Leahy Scale: Good       Standing balance-Leahy Scale: Fair                             ADL either performed or assessed with clinical judgement   ADL Overall ADL's : Needs assistance/impaired      Grooming: Set up   Upper Body Bathing: Set up;Sitting   Lower Body Bathing: Minimal assistance   Upper Body Dressing : Set up   Lower Body Dressing: Minimal assistance   Toilet Transfer: Contact guard assist   Toileting- Clothing Manipulation and Hygiene: Supervision/safety       Functional mobility during ADLs: Contact guard assist General ADL Comments: Easily fatigues with tasks; 1 LOB requiring assist to recover; began education on energy conservation strategies in addition to fall prevention     Vision Baseline Vision/History: 1 Wears glasses (reading)       Perception         Praxis         Pertinent Vitals/Pain Pain Assessment Pain Assessment: Faces Faces Pain Scale: Hurts little more Pain Descriptors / Indicators: Discomfort Pain Intervention(s):  (discomfort with coughing)     Extremity/Trunk Assessment Upper Extremity Assessment Upper Extremity Assessment: Defer to OT evaluation   Lower Extremity Assessment Lower Extremity Assessment: Overall WFL for tasks assessed   Cervical / Trunk Assessment Cervical / Trunk Assessment: Other exceptions Cervical / Trunk Exceptions: overweight   Communication Communication Communication: No apparent difficulties Cueing Techniques: Verbal cues;Visual cues   Cognition Arousal: Alert Behavior During Therapy: WFL for tasks assessed/performed Overall Cognitive Status: Within Functional Limits for tasks assessed  General Comments  on 3L with sats 90% at rest; down to 85% with ambulation requiring 2 minutes seated rest with instruction in PLB to recover to 88%    Exercises Exercises: Other exercises Other Exercises Other Exercises: pt demonstrated correct use of flutter valve x 10; vc for correct use of IS (to slow down) x 10 reps - able to pull   Shoulder Instructions      Home Living Family/patient expects to be discharged to:: Private  residence Living Arrangements: Spouse/significant other Available Help at Discharge: Family;Available 24 hours/day Type of Home: Mobile home Home Access: Ramped entrance     Home Layout: One level     Bathroom Shower/Tub: Producer, television/film/video: Standard Bathroom Accessibility: Yes How Accessible: Accessible via walker Home Equipment: Cane - single point;Rolling Walker (2 wheels);Adaptive equipment Adaptive Equipment: Reacher        Prior Functioning/Environment Prior Level of Function : Independent/Modified Independent             Mobility Comments: no use of assistive device ADLs Comments: socks are difficult at baseline; uses slip on shoes        OT Problem List: Decreased strength;Decreased activity tolerance;Decreased knowledge of use of DME or AE;Decreased safety awareness;Cardiopulmonary status limiting activity;Obesity      OT Treatment/Interventions: Self-care/ADL training;Therapeutic exercise;Energy conservation;DME and/or AE instruction;Therapeutic activities;Patient/family education    OT Goals(Current goals can be found in the care plan section) Acute Rehab OT Goals Patient Stated Goal: to get better and go home OT Goal Formulation: With patient Time For Goal Achievement: 10/11/22 Potential to Achieve Goals: Good  OT Frequency: Min 1X/week    Co-evaluation              AM-PAC OT "6 Clicks" Daily Activity     Outcome Measure Help from another person eating meals?: None Help from another person taking care of personal grooming?: A Little Help from another person toileting, which includes using toliet, bedpan, or urinal?: A Little Help from another person bathing (including washing, rinsing, drying)?: A Little Help from another person to put on and taking off regular upper body clothing?: A Little Help from another person to put on and taking off regular lower body clothing?: A Little 6 Click Score: 19   End of Session Equipment  Utilized During Treatment: Oxygen (3L) Nurse Communication: Mobility status  Activity Tolerance: Patient tolerated treatment well Patient left: in bed;with call bell/phone within reach;with family/visitor present  OT Visit Diagnosis: Unsteadiness on feet (R26.81);Muscle weakness (generalized) (M62.81)                Time: 1610-9604 OT Time Calculation (min): 25 min Charges:  OT General Charges $OT Visit: 1 Visit OT Evaluation $OT Eval Moderate Complexity: 1 Mod OT Treatments $Self Care/Home Management : 8-22 mins  Luisa Dago, OT/L   Acute OT Clinical Specialist Acute Rehabilitation Services Pager [phone] Office [phone]   [facility] 09/27/2022, 3:20 PM

## 2022-09-28 ENCOUNTER — Inpatient Hospital Stay (HOSPITAL_COMMUNITY): Payer: Medicare HMO

## 2022-09-28 DIAGNOSIS — R0902 Hypoxemia: Secondary | ICD-10-CM | POA: Diagnosis not present

## 2022-09-28 DIAGNOSIS — J1282 Pneumonia due to coronavirus disease 2019: Secondary | ICD-10-CM | POA: Diagnosis not present

## 2022-09-28 DIAGNOSIS — J189 Pneumonia, unspecified organism: Secondary | ICD-10-CM | POA: Diagnosis not present

## 2022-09-28 DIAGNOSIS — U071 COVID-19: Secondary | ICD-10-CM | POA: Diagnosis not present

## 2022-09-28 LAB — CBC
HCT: 27.4 % — ABNORMAL LOW (ref 36.0–46.0)
Hemoglobin: 8.9 g/dL — ABNORMAL LOW (ref 12.0–15.0)
MCH: 24.3 pg — ABNORMAL LOW (ref 26.0–34.0)
MCHC: 32.5 g/dL (ref 30.0–36.0)
MCV: 74.7 fL — ABNORMAL LOW (ref 80.0–100.0)
Platelets: 168 10*3/uL (ref 150–400)
RBC: 3.67 MIL/uL — ABNORMAL LOW (ref 3.87–5.11)
RDW: 15.1 % (ref 11.5–15.5)
WBC: 4.9 10*3/uL (ref 4.0–10.5)
nRBC: 0 % (ref 0.0–0.2)

## 2022-09-28 LAB — GLUCOSE, CAPILLARY
Glucose-Capillary: 150 mg/dL — ABNORMAL HIGH (ref 70–99)
Glucose-Capillary: 224 mg/dL — ABNORMAL HIGH (ref 70–99)
Glucose-Capillary: 261 mg/dL — ABNORMAL HIGH (ref 70–99)
Glucose-Capillary: 253 mg/dL — ABNORMAL HIGH (ref 70–99)

## 2022-09-28 LAB — CULTURE, BLOOD (ROUTINE X 2)
Culture: NO GROWTH
Culture: NO GROWTH
Special Requests: ADEQUATE
Special Requests: ADEQUATE

## 2022-09-28 LAB — BASIC METABOLIC PANEL
Anion gap: 10 (ref 5–15)
BUN: 31 mg/dL — ABNORMAL HIGH (ref 8–23)
CO2: 24 mmol/L (ref 22–32)
Calcium: 9.6 mg/dL (ref 8.9–10.3)
Chloride: 101 mmol/L (ref 98–111)
Creatinine, Ser: 1.05 mg/dL — ABNORMAL HIGH (ref 0.44–1.00)
GFR, Estimated: 55 mL/min — ABNORMAL LOW (ref >60)
Glucose, Bld: 178 mg/dL — ABNORMAL HIGH (ref 70–99)
Potassium: 3.9 mmol/L (ref 3.5–5.1)
Sodium: 135 mmol/L (ref 135–145)

## 2022-09-28 MED ORDER — HYDROCODONE-ACETAMINOPHEN 10-325 MG PO TABS
1.0000 | ORAL_TABLET | Freq: Once | ORAL | Status: AC
  Administered 2022-09-28: 1 via ORAL
  Filled 2022-09-28: qty 1

## 2022-09-28 NOTE — Progress Notes (Signed)
Nurse requested Mobility Specialist to perform oxygen saturation test with pt which includes removing pt from oxygen both at rest and while ambulating.  Below are the results from that testing.     Patient Saturations on Room Air at Rest = spO2 94%  Patient Saturations on Room Air while Ambulating = sp02 86% .    Patient Saturations on 3 Liters of oxygen while Ambulating = sp02 91%  At end of testing pt left in room on 3  Liters of oxygen.  Reported results to nurse.   Addison Lank Mobility Specialist Please contact via SecureChat or  Rehab office at [phone]

## 2022-09-28 NOTE — TOC Initial Note (Signed)
Transition of Care [facility]) - Initial/Assessment Note    Patient Details  Name: Tamara [name] MRN: [id number] Date of Birth: 1945-06-09  Transition of Care [facility]) CM/SW Contact:    Lawerance Sabal, RN Phone Number: 09/28/2022, 1:12 PM  Clinical Narrative:                  Covid. Independent from home w spouse Weaning oxygen Watch for home oxygen needs.  TOC following Expected Discharge Plan: Home/Self Care Barriers to Discharge: Continued Medical Work up   Patient Goals and CMS Choice            Expected Discharge Plan and Services                                              Prior Living Arrangements/Services                       Activities of Daily Living Home Assistive Devices/Equipment: None ADL Screening (condition at time of admission) Patient's cognitive ability adequate to safely complete daily activities?: Yes Is the patient deaf or have difficulty hearing?: No Does the patient have difficulty seeing, even when wearing glasses/contacts?: No Does the patient have difficulty concentrating, remembering, or making decisions?: No Patient able to express need for assistance with ADLs?: Yes Does the patient have difficulty dressing or bathing?: No Independently performs ADLs?: Yes (appropriate for developmental age) Does the patient have difficulty walking or climbing stairs?: Yes Weakness of Legs: None Weakness of Arms/Hands: None  Permission Sought/Granted                  Emotional Assessment              Admission diagnosis:  Hypoxia [R09.02] Community acquired pneumonia of right lower lobe of lung [J18.9] Pneumonia due to COVID-19 virus [U07.1, J12.82] COVID-19 [U07.1] Patient Active Problem List   Diagnosis Date Noted   Pneumonia due to COVID-19 virus 09/23/2022   Personal history of colonic polyps 05/05/2022   Polyp of descending colon 05/05/2022   Polyp of ascending colon 05/05/2022   Cecal polyp 05/05/2022    Acute bronchitis with COPD (HCC) 03/02/2022   Class 2 severe obesity with serious comorbidity and body mass index (BMI) of 38.0 to 38.9 in adult (HCC) 10/21/2021   Hepatic cirrhosis (HCC) 10/21/2021   Coronary artery disease involving native heart 10/21/2021   Aortic atherosclerosis (HCC) 10/21/2021   Cervical spondylosis without myelopathy 10/16/2021   Cervical radiculopathy 10/16/2021   Uncontrolled diabetes mellitus with hyperglycemia, with long-term current use of insulin (HCC) 11/29/2020   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 05/13/2020   Chronic cough 05/13/2020   COPD (chronic obstructive pulmonary disease) (HCC) 05/13/2020   Anemia of chronic disease 09/05/2017   Hypertension 07/14/2016   Hyperlipemia 07/14/2016   Bilateral lower extremity edema 07/14/2016   GERD (gastroesophageal reflux disease) 07/14/2016   PCP:  Margarita Mail, DO Pharmacy:   [facility] - [place], [place] - [address] [place] [address] Phone: [phone] Fax: [phone]  [facility] - [place], [place] - [address] [place] [address] Phone: [phone] Fax: [phone]  [facility] [address] [place] - [address] [place] [address] Phone: [phone] Fax: [phone]  Social Determinants of Health (SDOH) Social History: SDOH Screenings   Food Insecurity: No Food Insecurity (09/23/2022)  Housing: Medium Risk (09/23/2022)  Transportation Needs: No Transportation Needs (09/23/2022)  Utilities: Not At Risk (09/23/2022)  Alcohol Screen: Low Risk  (10/16/2020)  Depression (PHQ2-9): Low Risk  (05/31/2022)  Financial Resource Strain: Medium Risk (10/20/2021)  Physical Activity: Inactive (10/20/2021)  Social Connections: Moderately Integrated (10/20/2021)  Stress: Stress Concern Present (10/20/2021)  Tobacco Use: Medium Risk (09/23/2022)   SDOH Interventions:     Readmission  Risk Interventions     No data to display

## 2022-09-28 NOTE — Progress Notes (Signed)
Mobility Specialist Progress Note:   09/28/22 1600  Mobility  Activity Ambulated with assistance in room  Level of Assistance Standby assist, set-up cues, supervision of patient - no hands on  Assistive Device None  Distance Ambulated (ft) 50 ft  Activity Response Tolerated well  Mobility Referral Yes  $Mobility charge 1 Mobility  Mobility Specialist Start Time (ACUTE ONLY) 1535  Mobility Specialist Stop Time (ACUTE ONLY) 1600  Mobility Specialist Time Calculation (min) (ACUTE ONLY) 25 min   Pre Mobility: SpO2 99% 5L During Mobility: SpO2 91% 3L Post Mobility: SpO2 93% 3L  Pt agreeable to mobility session. Was on 5LO2 upon arrival, able to titrate down to 3LO2 during ambulation. No physical assistance required. Pt left sitting in chair with all needs met, on 3LO2.   Addison Lank Mobility Specialist Please contact via SecureChat or  Rehab office at [phone]

## 2022-09-28 NOTE — Care Management Important Message (Signed)
Important Message  Patient Details  Name: EMALY RIGHETTI MRN: [id number] Date of Birth: [birth year]   Medicare Important Message Given:  Yes  Patient has a contact precaution order in place will mail a copy to the patient home address.     Dietra Stokely 09/28/2022, 4:12 PM

## 2022-09-28 NOTE — Inpatient Diabetes Management (Addendum)
Inpatient Diabetes Program Recommendations  AACE/ADA: New Consensus Statement on Inpatient Glycemic Control (2015)  Target Ranges:  Prepandial:   less than 140 mg/dL      Peak postprandial:   less than 180 mg/dL (1-2 hours)      Critically ill patients:  140 - 180 mg/dL   Lab Results  Component Value Date   GLUCAP 321 (H) 09/27/2022   HGBA1C 8.8 (H) 09/23/2022    Review of Glycemic Control  Latest Reference Range & Units 09/26/22 21:59 09/27/22 07:46 09/27/22 12:56 09/27/22 16:17 09/27/22 20:37  Glucose-Capillary 70 - 99 mg/dL 027 (H) 253 (H)  Novolog 21 units  Semglee 60 units 207 (H)  Novolog 13 units 294 (H)  Novolog 17 units  321 (H)  Novolog 5 units   Diabetes history: DM type 2 Outpatient Diabetes medications: NPH Novolin 36 units every am, 28-30 units every HS, Metformin 1000 mg BID, Farxiga 5 mg daily Current orders for Inpatient glycemic control:  Semglee 60 units daily Novolog 0-20 units tid + hs Novolog 6 units TID Farxiga 5 mg Daily  Decadron 6 mg Daily  Inpatient Diabetes Program Recommendations:    -    Increase Semglee to 35 units bid -    Increase Novolog meal coverage to 12 units tid if eating >50% of meals  Thanks,  Christena Deem RN, MSN, BC-ADM Inpatient Diabetes Coordinator Team Pager [phone] (8a-5p)

## 2022-09-28 NOTE — Progress Notes (Signed)
PROGRESS NOTE    Tamara [name]  WUJ:[id number] DOB: [birth year] DOA: 09/23/2022 PCP: Margarita Mail, DO    Chief Complaint  Patient presents with   Shortness of Breath    Brief Narrative:   Tamara [name] is a 77 y.o. female with Past medical history of HTN, HLD, IDDM T2, COPD, hypothyroid, OSA, GERD, as reviewed from EMR, presented at [facility], ED with complaining of fever, worsening of shortness of breath and productive cough for 4 to 5 days.  Patient has been followed with pulmonary as an outpatient, where she has been treated with levofloxacin.  Her workup in ED significant for COVID-19 positive infection, her CTA chest was negative for PE, but with Extensive heterogeneous opacities throughout bilateral lungs with asymmetric more involvement of the left lung lower lobe, compatible with multilobar pneumonia.   Assessment & Plan:   Principal Problem:   Pneumonia due to COVID-19 virus  Acute respiratory failure with hypoxia Multilobar pneumonia COVID-19 pneumonia/infection Severe sepsis secondary to COVID-pneumonia -Sepsis criteria, hypoxia, fever, leukopenia, tachypnea and pneumonia -Continue supplemental O2, she is on room air at baseline, she remains requiring 3 L nasal cannula at rest, at 5 with activity. -Still having significant copious secretions with incentive spirometer and flutter valve -Did with remdesivir Continue Decadron 6 mg p.o. daily for 10 days giving her hypoxia PPI for GI prophylaxis Started Breo Ellipta inhaler, Incruse inhaler and albuterol inhaler Started Mucinex 600 mg p.o. twice daily, Robitussin DM as needed Continue with IV Rocephin and azithromycin to cover for bacterial pneumonia as well, to finish 5 days -Pulmonary input greatly appreciated, discussed with SLP, plan for MBS today as discussed with SLP service -Further workup per pulmonary, plan for PET scan on 9/16, but patient with evolving infiltrate, so they may consider proceeding  with bronchoscopy when stable -pulmonary consult greatly appreciated     Hypokalemia,  - potassium repleted.  Hypophosphatemia, -  Phos repleted.  Hypomagnesemia - mag repleted.  Hyponatremia -  continue to monitor   Pancytopenia - could be due to viral infection.   - Monitor CBC daily     IDDM T2 - Very difficult to control, she has been refusing [place] -She is on insulin NPH at home, she has been transitioned to [facility] during hospital stay for better control, CBG remains much improved after increasing to 60 units, continue with insulin sliding scale and Premeal NovoLog.     HTN, HLD Resumed home meds aspirin, statin, Zetia, irbesartan Diuretics changed to Lasix 20 mg every 12 hourly Monitor BP and titrate medications accordingly Monitor urine output and renal functions   Hypothyroid, - continued Synthroid   CKD stage IIIa -Monitor renal function closely and avoid nephrotoxic medications     Nutrition: Carb modified diet   DVT prophylaxis: Lovenox Code Status: Full code Family Communication: None at bedside Disposition:   Status is: Inpatient  Consultants:  pulmonary   Subjective:  She still reports significant phlegm when using incentive spirometer and flutter valve Objective: Vitals:   09/27/22 1928 09/27/22 2322 09/28/22 0400 09/28/22 1106  BP: (!) 121/58 (!) 125/50 124/64 136/60  Pulse: 99 86 88 (!) 106  Resp: (!) 21 (!) 21 (!) 21 20  Temp: 98.2 F (36.8 C) 98.9 F (37.2 C) 98 F (36.7 C) 98.1 F (36.7 C)  TempSrc: Oral Oral Oral Oral  SpO2: 92% 95% 95% (!) 88%  Weight:      Height:        Intake/Output Summary (Last 24 hours) at  09/28/2022 1459 Last data filed at 09/28/2022 1400 Gross per 24 hour  Intake 1440 ml  Output 450 ml  Net 990 ml   Filed Weights   09/23/22 1156  Weight: 104.8 kg    Examination:  Awake Alert, Oriented X 3, No new F.N deficits, frail Symmetrical Chest wall movement, Good air movement bilaterally,  scattered Rales bilaterally RRR,No Gallops,Rubs or new Murmurs, No Parasternal Heave +ve B.Sounds, Abd Soft, No tenderness, No rebound - guarding or rigidity. No Cyanosis, Clubbing or edema, No new Rash or bruise      Data Reviewed: I have personally reviewed following labs and imaging studies  CBC: Recent Labs  Lab 09/23/22 1159 09/24/22 0453 09/25/22 1030 09/26/22 0719 09/27/22 0532 09/28/22 0413  WBC 3.0* 1.5* 3.8* 4.0 3.9* 4.9  NEUTROABS 2.1  --   --   --   --   --   HGB 10.1* 8.8* 9.1* 8.9* 8.9* 8.9*  HCT 31.0* 27.1* 28.2* 27.4* 27.4* 27.4*  MCV 76.9* 76.8* 75.4* 74.7* 74.9* 74.7*  PLT 136* 107* 144* 149* 161 168    Basic Metabolic Panel: Recent Labs  Lab 09/23/22 1159 09/24/22 0453 09/25/22 1030 09/26/22 0719 09/27/22 0532 09/28/22 0413  NA 129* 129* 131* 132* 133* 135  K 3.3* 3.9 3.6 3.7 3.8 3.9  CL 94* 95* 98 96* 97* 101  CO2 23 22 21* 25 23 24   GLUCOSE 218* 438* 386* 373* 293* 178*  BUN 14 17 26* 29* 34* 31*  CREATININE 1.25* 1.21* 1.35* 1.35* 1.27* 1.05*  CALCIUM 8.6* 8.3* 8.5* 9.2 9.1 9.6  MG 1.3* 2.7* 2.0 1.8  --   --   PHOS 1.8* 5.5* 3.5 3.9  --   --     GFR: Estimated Creatinine Clearance: 56.6 mL/min (A) (by C-G formula based on SCr of 1.05 mg/dL (H)).  Liver Function Tests: Recent Labs  Lab 09/23/22 1159 09/24/22 0453 09/25/22 1030 09/26/22 0719  AST 45* 33 30 26  ALT 28 24 23 23   ALKPHOS 47 40 47 40  BILITOT 0.8 0.5 0.4 0.5  PROT 6.8 5.5* 6.0* 6.2*  ALBUMIN 3.3* 2.6* 2.8* 2.9*    CBG: Recent Labs  Lab 09/27/22 1256 09/27/22 1617 09/27/22 2037 09/28/22 0825 09/28/22 1156  GLUCAP 207* 294* 321* 150* 224*     Recent Results (from the past 240 hour(s))  Culture, blood (Routine x 2)     Status: None   Collection Time: 09/23/22 11:59 AM   Specimen: BLOOD LEFT ARM  Result Value Ref Range Status   Specimen Description BLOOD LEFT ARM  Final   Special Requests   Final    BOTTLES DRAWN AEROBIC AND ANAEROBIC Blood Culture  adequate volume   Culture   Final    NO GROWTH 5 DAYS Performed at [facility], [address], [place], [place] [address]    Report Status 09/28/2022 FINAL  Final  Resp panel by RT-PCR (RSV, Flu A&B, Covid) Anterior Nasal Swab     Status: Abnormal   Collection Time: 09/23/22 11:59 AM   Specimen: Anterior Nasal Swab  Result Value Ref Range Status   SARS Coronavirus 2 by RT PCR POSITIVE (A) NEGATIVE Final   Influenza A by PCR NEGATIVE NEGATIVE Final   Influenza B by PCR NEGATIVE NEGATIVE Final    Comment: (NOTE) The Xpert Xpress SARS-CoV-2/FLU/RSV plus assay is intended as an aid in the diagnosis of influenza from Nasopharyngeal swab specimens and should not be used as a sole basis for  treatment. Nasal washings and aspirates are unacceptable for Xpert Xpress SARS-CoV-2/FLU/RSV testing.  Fact Sheet for Patients: BloggerCourse.com  Fact Sheet for Healthcare Providers: SeriousBroker.it  This test is not yet approved or cleared by the [place] FDA and has been authorized for detection and/or diagnosis of SARS-CoV-2 by FDA under an Emergency Use Authorization (EUA). This EUA will remain in effect (meaning this test can be used) for the duration of the COVID-19 declaration under Section 564(b)(1) of the Act, 21 U.S.C. section 360bbb-3(b)(1), unless the authorization is terminated or revoked.     Resp Syncytial Virus by PCR NEGATIVE NEGATIVE Final    Comment: (NOTE) Fact Sheet for Patients: BloggerCourse.com  Fact Sheet for Healthcare Providers: SeriousBroker.it  This test is not yet approved or cleared by the [place] FDA and has been authorized for detection and/or diagnosis of SARS-CoV-2 by FDA under an Emergency Use Authorization (EUA). This EUA will remain in effect (meaning this test can be used) for the duration of the COVID-19 declaration under Section  564(b)(1) of the Act, 21 U.S.C. section 360bbb-3(b)(1), unless the authorization is terminated or revoked.  Performed at [facility], [address] [place], [place] [address]   Culture, blood (Routine x 2)     Status: None   Collection Time: 09/23/22  1:30 PM   Specimen: BLOOD RIGHT ARM  Result Value Ref Range Status   Specimen Description BLOOD RIGHT ARM  Final   Special Requests   Final    BOTTLES DRAWN AEROBIC AND ANAEROBIC Blood Culture adequate volume   Culture   Final    NO GROWTH 5 DAYS Performed at [facility], [address] [place], [place] [address]    Report Status 09/28/2022 FINAL  Final         Radiology Studies: DG Chest Port 1 View  Result Date: 09/28/2022 CLINICAL DATA:  Shortness of breath, COVID positive EXAM: PORTABLE CHEST - 1 VIEW COMPARISON:  09/23/2022 FINDINGS: Persistent bibasilar interstitial and airspace opacities left greater than right. Worsening right upper lobe opacities. Heart size and mediastinal contours are within normal limits. Aortic Atherosclerosis (ICD10-170.0). No effusion. Visualized bones unremarkable. IMPRESSION: Persistent bibasilar and worsening right upper lobe opacities. Electronically Signed   By: Corlis Leak M.D.   On: 09/28/2022 14:18   DG Swallowing Func-Speech Pathology  Result Date: 09/27/2022 Table formatting from the original result was not included. Modified Barium Swallow Study Patient Details Name: BAILYN ROCKOFF MRN: [id number] Date of Birth: 1945/06/02 Today's Date: 09/27/2022 HPI/PMH: HPI: 78 y.o. female presented at [facility], ED with complaining of fever, worsening of shortness of breath and productive cough. +COVID pna,  Past medical history of HTN, HLD, IDDM T2, COPD, hypothyroid, OSA, GERD, cavitary lung mass Clinical Impression: Clinical Impression: Pt demonstrated normal oropharyngeal swallow function. Possible esophageal dysphagia is suspected. She coughed once prior to po's likely from Covid  diagnosis. Her tongue control, bolus preparation/mastication and transport were all within functional limits. Swallows were initiated timely with adequate strength to prevent aspiration. During pill consumption there was one trace, flash penetration of thin liquid that exited vestibule during the swallow. All boluses moved through her pharynx into upper esophageal sphincter without any residual. The pill stopped at the GE junction and did not move into the stomach given additional thin liquid. There was mild, possibly moderate residue seen in her esophagus at end of study which may account for her complaint of coughing at the end of meals. Recommend continue regular texture, thin  liquids, pills with thin, stay sitting upright after meals, alternate liquids and solids and therapist educated pt on strategies. Pt states she sleeps in a reclined position at home due to her reflux. No further ST warranted at this time. Factors that may increase risk of adverse event in presence of aspiration [name] [employer] [name] 2021): No data recorded Recommendations/Plan: Swallowing Evaluation Recommendations Swallowing Evaluation Recommendations Recommendations: PO diet PO Diet Recommendation: Regular; Thin liquids (Level 0) Liquid Administration via: Cup; Straw Medication Administration: Whole meds with liquid Supervision: Patient able to self-feed Swallowing strategies  : Small bites/sips; Slow rate Postural changes: Stay upright 30-60 min after meals; Position pt fully upright for meals Oral care recommendations: Oral care BID (2x/day) Treatment Plan Treatment Plan Treatment recommendations: No treatment recommended at this time Follow-up recommendations: No SLP follow up Functional status assessment: Patient has not had a recent decline in their functional status. Recommendations Recommendations for follow up therapy are one component of a multi-disciplinary discharge planning process, led by the attending physician.  Recommendations  may be updated based on patient status, additional functional criteria and insurance authorization. Assessment: Orofacial Exam: Orofacial Exam Oral Cavity: Oral Hygiene: WFL Oral Cavity - Dentition: Dentures, top; Dentures, bottom Orofacial Anatomy: WFL Oral Motor/Sensory Function: WFL Anatomy: Anatomy: WFL Boluses Administered: Boluses Administered Boluses Administered: Thin liquids (Level 0); Mildly thick liquids (Level 2, nectar thick); Moderately thick liquids (Level 3, honey thick); Puree; Solid  Oral Impairment Domain: Oral Impairment Domain Lip Closure: No labial escape Tongue control during bolus hold: Cohesive bolus between tongue to palatal seal Bolus preparation/mastication: Timely and efficient chewing and mashing Bolus transport/lingual motion: Brisk tongue motion Oral residue: Complete oral clearance Location of oral residue : N/A Initiation of pharyngeal swallow : Valleculae  Pharyngeal Impairment Domain: Pharyngeal Impairment Domain Soft palate elevation: No bolus between soft palate (SP)/pharyngeal wall (PW) Laryngeal elevation: Complete superior movement of thyroid cartilage with complete approximation of arytenoids to epiglottic petiole Anterior hyoid excursion: Complete anterior movement Epiglottic movement: Complete inversion Laryngeal vestibule closure: Complete, no air/contrast in laryngeal vestibule Pharyngeal stripping wave : Present - complete Pharyngeal contraction (A/P view only): N/A Pharyngoesophageal segment opening: Complete distension and complete duration, no obstruction of flow Tongue base retraction: No contrast between tongue base and posterior pharyngeal wall (PPW) Pharyngeal residue: Complete pharyngeal clearance Location of pharyngeal residue: N/A  Esophageal Impairment Domain: Esophageal Impairment Domain Esophageal clearance upright position: Esophageal retention Pill: Pill Consistency administered: Thin liquids (Level 0) Thin liquids (Level 0): -- (flash penetration with  thin while taking pill) Penetration/Aspiration Scale Score: Penetration/Aspiration Scale Score 1.  Material does not enter airway: Mildly thick liquids (Level 2, nectar thick); Moderately thick liquids (Level 3, honey thick); Puree; Solid 2.  Material enters airway, remains ABOVE vocal cords then ejected out: Thin liquids (Level 0) Compensatory Strategies: Compensatory Strategies Compensatory strategies: No   General Information: Caregiver present: No  Diet Prior to this Study: Regular; Thin liquids (Level 0)   Temperature : Normal   Respiratory Status: WFL   Supplemental O2: Nasal cannula   History of Recent Intubation: No  Behavior/Cognition: Alert; Cooperative; Pleasant mood Self-Feeding Abilities: Able to self-feed Baseline vocal quality/speech: Normal Volitional Cough: Able to elicit Volitional Swallow: Able to elicit Exam Limitations: No limitations Goal Planning: No data recorded No data recorded No data recorded No data recorded Consulted and agree with results and recommendations: Patient Pain: Pain Assessment Pain Assessment: -- (some pain with moving to dysphagia chair) Breathing: 0 Negative Vocalization: 0 Facial Expression: 0 Facial  Expression: 0 End of Session: Start Time:SLP Start Time (ACUTE ONLY): 1414 Stop Time: SLP Stop Time (ACUTE ONLY): 1427 Time Calculation:SLP Time Calculation (min) (ACUTE ONLY): 13 min Charges: SLP Evaluations $ SLP Speech Visit: 1 Visit SLP Evaluations $MBS Swallow: 1 Procedure SLP visit diagnosis: SLP Visit Diagnosis: Dysphagia, unspecified (R13.10) Past Medical History: Past Medical History: Diagnosis Date  Arthritis   knee and shoulders  Colon polyps 09/05/2017  COPD (chronic obstructive pulmonary disease) (HCC)   Diabetes mellitus without complication (HCC)   type II  Dyspnea   05/16/20 has had a cough for 2.5 years post Covid  Gastritis 09/05/2017  GERD (gastroesophageal reflux disease)   Hyperlipidemia   Hypertension   patient denies  Hypothyroidism   Panic attack   RUQ  pain 03/09/2017  Per patient, she has had RUQ pain 4-5 years that has grown worse in the last few months. Past Surgical History: Past Surgical History: Procedure Laterality Date  BREAST BIOPSY Left 2008  CORE W/CLIP - NEG  BRONCHIAL BIOPSY  05/19/2020  Procedure: BRONCHIAL BIOPSIES;  Surgeon: Leslye Peer, MD;  Location: [facility] ENDOSCOPY;  Service: Pulmonary;;  BRONCHIAL BRUSHINGS  05/19/2020  Procedure: BRONCHIAL BRUSHINGS;  Surgeon: Leslye Peer, MD;  Location: [facility] ENDOSCOPY;  Service: Pulmonary;;  BRONCHIAL NEEDLE ASPIRATION BIOPSY  05/19/2020  Procedure: BRONCHIAL NEEDLE ASPIRATION BIOPSIES;  Surgeon: Leslye Peer, MD;  Location: [facility] ENDOSCOPY;  Service: Pulmonary;;  BRONCHIAL WASHINGS  05/19/2020  Procedure: BRONCHIAL WASHINGS;  Surgeon: Leslye Peer, MD;  Location: [facility] ENDOSCOPY;  Service: Pulmonary;;  COLONOSCOPY WITH PROPOFOL N/A 02/08/2017  Procedure: COLONOSCOPY WITH PROPOFOL;  Surgeon: Christena Deem, MD;  Location: [facility] ENDOSCOPY;  Service: Endoscopy;  Laterality: N/A;  COLONOSCOPY WITH PROPOFOL N/A 05/05/2022  Procedure: COLONOSCOPY WITH PROPOFOL;  Surgeon: Toney Reil, MD;  Location: [facility] ENDOSCOPY;  Service: Gastroenterology;  Laterality: N/A;  ESOPHAGOGASTRODUODENOSCOPY (EGD) WITH PROPOFOL N/A 02/08/2017  Procedure: ESOPHAGOGASTRODUODENOSCOPY (EGD) WITH PROPOFOL;  Surgeon: Christena Deem, MD;  Location: [facility] ENDOSCOPY;  Service: Endoscopy;  Laterality: N/A;  TUBAL LIGATION    VIDEO BRONCHOSCOPY WITH ENDOBRONCHIAL NAVIGATION N/A 05/19/2020  Procedure: VIDEO BRONCHOSCOPY WITH ENDOBRONCHIAL NAVIGATION;  Surgeon: Leslye Peer, MD;  Location: [facility] ENDOSCOPY;  Service: Pulmonary;  Laterality: N/A; [name] 09/27/2022, 3:06 PM       Scheduled Meds:  vitamin C  500 mg Oral Daily   aspirin EC  81 mg Oral Daily   cholecalciferol  1,000 Units Oral Daily   dapagliflozin propanediol  5 mg Oral Daily   dexamethasone  6 mg Oral Daily   enoxaparin (LOVENOX) injection  40 mg  Subcutaneous Q24H   ezetimibe  10 mg Oral Daily   fluticasone furoate-vilanterol  1 puff Inhalation Daily   furosemide  40 mg Intravenous Daily   guaiFENesin  600 mg Oral BID   insulin aspart  0-20 Units Subcutaneous TID WC   insulin aspart  0-5 Units Subcutaneous QHS   insulin aspart  6 Units Subcutaneous TID WC   insulin glargine-yfgn  60 Units Subcutaneous Daily   irbesartan  75 mg Oral Daily   levothyroxine  50 mcg Oral Q0600   multivitamin with minerals  1 tablet Oral Daily   pantoprazole  40 mg Oral BID   sodium chloride flush  3 mL Intravenous Q12H   sodium chloride flush  3 mL Intravenous Q12H   umeclidinium bromide  1 puff Inhalation Daily   Continuous Infusions:  sodium chloride       LOS: 5 days  Huey Bienenstock, MD [facility]   To contact the attending provider between 7A-7P or the covering provider during after hours 7P-7A, please log into the web site [email or web address] and access using universal [place] password for that web site. If you do not have the password, please call the hospital operator.  09/28/2022, 2:59 PM

## 2022-09-29 ENCOUNTER — Telehealth: Payer: Self-pay | Admitting: Pharmacist

## 2022-09-29 ENCOUNTER — Other Ambulatory Visit: Payer: Medicare HMO | Admitting: Pharmacist

## 2022-09-29 DIAGNOSIS — J1282 Pneumonia due to coronavirus disease 2019: Secondary | ICD-10-CM | POA: Diagnosis not present

## 2022-09-29 DIAGNOSIS — U071 COVID-19: Secondary | ICD-10-CM | POA: Diagnosis not present

## 2022-09-29 DIAGNOSIS — R0902 Hypoxemia: Secondary | ICD-10-CM | POA: Diagnosis not present

## 2022-09-29 LAB — GLUCOSE, CAPILLARY
Glucose-Capillary: 181 mg/dL — ABNORMAL HIGH (ref 70–99)
Glucose-Capillary: 301 mg/dL — ABNORMAL HIGH (ref 70–99)
Glucose-Capillary: 297 mg/dL — ABNORMAL HIGH (ref 70–99)
Glucose-Capillary: 269 mg/dL — ABNORMAL HIGH (ref 70–99)

## 2022-09-29 LAB — BASIC METABOLIC PANEL
Anion gap: 10 (ref 5–15)
BUN: 32 mg/dL — ABNORMAL HIGH (ref 8–23)
CO2: 25 mmol/L (ref 22–32)
Calcium: 9.2 mg/dL (ref 8.9–10.3)
Chloride: 97 mmol/L — ABNORMAL LOW (ref 98–111)
Creatinine, Ser: 1.13 mg/dL — ABNORMAL HIGH (ref 0.44–1.00)
GFR, Estimated: 51 mL/min — ABNORMAL LOW (ref >60)
Glucose, Bld: 179 mg/dL — ABNORMAL HIGH (ref 70–99)
Potassium: 3.8 mmol/L (ref 3.5–5.1)
Sodium: 132 mmol/L — ABNORMAL LOW (ref 135–145)

## 2022-09-29 LAB — CBC
HCT: 27.1 % — ABNORMAL LOW (ref 36.0–46.0)
Hemoglobin: 8.7 g/dL — ABNORMAL LOW (ref 12.0–15.0)
MCH: 24.3 pg — ABNORMAL LOW (ref 26.0–34.0)
MCHC: 32.1 g/dL (ref 30.0–36.0)
MCV: 75.7 fL — ABNORMAL LOW (ref 80.0–100.0)
Platelets: 185 10*3/uL (ref 150–400)
RBC: 3.58 MIL/uL — ABNORMAL LOW (ref 3.87–5.11)
RDW: 15.2 % (ref 11.5–15.5)
WBC: 5.2 10*3/uL (ref 4.0–10.5)
nRBC: 0 % (ref 0.0–0.2)

## 2022-09-29 LAB — PROCALCITONIN: Procalcitonin: <0.1 ng/mL

## 2022-09-29 LAB — C-REACTIVE PROTEIN: CRP: 4.5 mg/dL — ABNORMAL HIGH (ref <1.0)

## 2022-09-29 LAB — BRAIN NATRIURETIC PEPTIDE: B Natriuretic Peptide: 33.7 pg/mL (ref 0.0–100.0)

## 2022-09-29 NOTE — Progress Notes (Signed)
Occupational Therapy Treatment Patient Details Name: Tamara [name] MRN: [id number] DOB: 1945-05-23 Today's Date: 09/29/2022   History of present illness 77 y.o. female presented at [facility], ED with complaining of fever, worsening of shortness of breath and productive cough.   +COVID pna,  Past medical history of HTN, HLD, IDDM T2, COPD, hypothyroid, OSA, GERD   OT comments  Pt making steady progress towards OT goals this session. Pt continues to present with decreased activity tolerance, generalized weakness and SOB with exertion . Session focus on increasing overall endurance for higher level functional mobility tasks and education related to energy conservation at home. Pt on 4L [place] during session with SpO2 dropping top 81% with mobility. Pt currently requires supervision for household distance functional mobility with no AD but MIN cues needed for O2 line mgmt and safety. Pt would continue to benefit from skilled occupational therapy while admitted and after d/c to address the below listed limitations in order to improve overall functional mobility and facilitate independence with BADL participation. DC plan remains appropriate, will follow acutely per POC.         If plan is discharge home, recommend the following:  A little help with bathing/dressing/bathroom;Assistance with cooking/housework   Equipment Recommendations  None recommended by OT    Recommendations for Other Services      Precautions / Restrictions Precautions Precautions: Fall Precaution Comments: watch O2 sats       Mobility Bed Mobility               General bed mobility comments: in recliner    Transfers   Equipment used: None               General transfer comment: pt completed 3 laps on functional ambulation in room wtih no AD ( ~ 69ft), supervision for safety and O2 line mgmt. pt benefits from cues for safety to sync breathing with movement as pt desaturates with mobility      Balance Overall balance assessment: Needs assistance   Sitting balance-Leahy Scale: Good       Standing balance-Leahy Scale: Fair                             ADL either performed or assessed with clinical judgement   ADL Overall ADL's : Needs assistance/impaired                         Toilet Transfer: Supervision/safety;Ambulation Toilet Transfer Details (indicate cue type and reason): simulated via functional mobility in room, decreased activity tolerance and dyspnea with exertion         Functional mobility during ADLs: Supervision/safety;Cueing for safety General ADL Comments: ADL participation impacted by decreased activity tolerance and SOB with exertion    Extremity/Trunk Assessment Upper Extremity Assessment Upper Extremity Assessment: Generalized weakness   Lower Extremity Assessment Lower Extremity Assessment: Defer to PT evaluation        Vision Baseline Vision/History: 1 Wears glasses (readers) Patient Visual Report: No change from baseline     Perception Perception Perception: Not tested   Praxis Praxis Praxis: Not tested    Cognition Arousal: Alert Behavior During Therapy: [facility] for tasks assessed/performed Overall Cognitive Status: Within Functional Limits for tasks assessed  Exercises      Shoulder Instructions       General Comments pt on 4L [place] during session. SpO2 drop to 81% with mobility, pt requires seated rest break and pursed lip breathing to rebound to > 90%. education provided on use of pulse ox at home as well as O2 parameters, energy conservation strategies for home with handout provide and use of DME as needed such as shower chair. education provided on managing o2 line at home if pt does return home with O2    Pertinent Vitals/ Pain       Pain Assessment Pain Assessment: No/denies pain  Home Living                                           Prior Functioning/Environment              Frequency  Min 1X/week        Progress Toward Goals  OT Goals(current goals can now be found in the care plan section)  Progress towards OT goals: Progressing toward goals  Acute Rehab OT Goals Patient Stated Goal: to go home OT Goal Formulation: With patient Time For Goal Achievement: 10/11/22 Potential to Achieve Goals: Good  Plan      Co-evaluation                 AM-PAC OT "6 Clicks" Daily Activity     Outcome Measure   Help from another person eating meals?: None Help from another person taking care of personal grooming?: None Help from another person toileting, which includes using toliet, bedpan, or urinal?: A Little Help from another person bathing (including washing, rinsing, drying)?: A Little Help from another person to put on and taking off regular upper body clothing?: None Help from another person to put on and taking off regular lower body clothing?: None 6 Click Score: 22    End of Session Equipment Utilized During Treatment: Oxygen;Other (comment) (4L)  OT Visit Diagnosis: Unsteadiness on feet (R26.81);Muscle weakness (generalized) (M62.81)   Activity Tolerance Patient tolerated treatment well   Patient Left in chair;with call bell/phone within reach   Nurse Communication Mobility status;Other (comment) (via secure chat)        Time: 1610-9604 OT Time Calculation (min): 26 min  Charges: OT General Charges $OT Visit: 1 Visit OT Treatments $Self Care/Home Management : 23-37 mins  Lenor Derrick., COTA/L Acute Rehabilitation Services [phone]   Barron Schmid 09/29/2022, 8:58 AM

## 2022-09-29 NOTE — Inpatient Diabetes Management (Signed)
Inpatient Diabetes Program Recommendations  AACE/ADA: New Consensus Statement on Inpatient Glycemic Control (2015)  Target Ranges:  Prepandial:   less than 140 mg/dL      Peak postprandial:   less than 180 mg/dL (1-2 hours)      Critically ill patients:  140 - 180 mg/dL    Latest Reference Range & Units 09/28/22 08:25 09/28/22 11:56 09/28/22 16:35 09/28/22 22:07  Glucose-Capillary 70 - 99 mg/dL 621 (H)  9 units Novolog  60 units Semglee  224 (H)  13 units Novolog  261 (H)  17 units Novolog  253 (H)  3 units Novolog   (H): Data is abnormally high  Latest Reference Range & Units 09/29/22 08:45  Glucose-Capillary 70 - 99 mg/dL 308 (H)  10 units Novolog  60 units Semglee  (H): Data is abnormally high      Home DM Meds: NPH Novolin 36 units QAM/ 28-30 units QPM     Metformin 1000 mg BID     Farxiga 5 mg daily    Current Orders: Semglee 60 units Daily      Novolog Resistant Correction Scale/ SSI (0-20 units) TID AC + HS      Novolog 6 units TID with meals      Farxiga 5 mg daily     MD- Note pt getting Decadron 6 mg Daily  Please consider increasing the Novolog Meal Coverage to 10 units TID with meals    --Will follow patient during hospitalization--  Ambrose Finland RN, MSN, CDCES Diabetes Coordinator Inpatient Glycemic Control Team Team Pager: [phone] (8a-5p)

## 2022-09-29 NOTE — Progress Notes (Signed)
   Outreach Note  09/29/2022 Name: JINNY GOLAN MRN: [id number] DOB: 1945/09/06  Referred by: Margarita Mail, DO  Scheduled to outreach to patient today to follow up regarding medication assistance/review of medications. However, prior to outreaching to patient, note patient currently admitted to [facility] related to Pneumonia due to COVID-19 virus.   Follow Up Plan: Will attempt to reach patient by telephone within the next 30 days  Estelle Grumbles, PharmD, [facility] [phone]

## 2022-09-29 NOTE — Progress Notes (Signed)
PROGRESS NOTE        PATIENT DETAILS Name: Tamara [name] Age: 77 y.o. Sex: female Date of Birth: 1945/09/12 Admit Date: 09/23/2022 Admitting Physician Gillis Santa, MD WUJ:[id number], Gentry Fitz, DO  Brief Summary: Patient is a 77 y.o.  female who with a history of HTN, HLD, DM-2, COPD, OSA, GERD, who presented with fever, shortness of breath, cough-she was found to have acute hypoxic respiratory failure due to COVID-19 PNA.  Significant events: 9/5>> admit to [facility]  Significant studies: 9/5>> CT angio chest: No PE, multilobar pneumonia  Significant microbiology data: 9/5>> COVID-19 PCR: Positive 9/5>> influenza/RSV PCR: Negative 9/5>> blood culture: No growth  Procedures: None  Consults: Pulmonology  Subjective: Feels tired-on 3 L of oxygen this morning.  Objective: Vitals: Blood pressure (!) 111/51, pulse 88, temperature 98.4 F (36.9 C), temperature source Oral, resp. rate 20, height 5\' 6"  (1.676 m), weight 104.8 kg, SpO2 93%.   Exam: Gen Exam:Alert awake-not in any distress HEENT:atraumatic, normocephalic Chest: B/L clear to auscultation anteriorly CVS:S1S2 regular Abdomen:soft non tender, non distended Extremities:+ edema Neurology: Non focal Skin: no rash  Pertinent Labs/Radiology:    Latest Ref Rng & Units 09/29/2022    6:21 AM 09/28/2022    4:13 AM 09/27/2022    5:32 AM  CBC  WBC 4.0 - 10.5 K/uL 5.2  4.9  3.9   Hemoglobin 12.0 - 15.0 g/dL 8.7  8.9  8.9   Hematocrit 36.0 - 46.0 % 27.1  27.4  27.4   Platelets 150 - 400 K/uL 185  168  161     Lab Results  Component Value Date   NA 132 (L) 09/29/2022   K 3.8 09/29/2022   CL 97 (L) 09/29/2022   CO2 25 09/29/2022      Assessment/Plan: Severe sepsis with acute hypoxic respiratory failure due to COVID PNA Sepsis physiology improved Hypoxia stable-on 3 L of oxygen-requiring around 5 L of oxygen with activity Completed remdesivir x 3 days Completed empiric Rocephin/Zithromax  x 5 days Continue Decadron Some mild leg edema today-continue IV Lasix Continue to wean down FiO2 as tolerated  Pancytopenia Secondary to above Improving  History of LLL cavitary mass Known history of lung mass-negative prior bronch Per PCCM consult note-PET scan is planned upon follow-up as an outpatient.  DM-2 with steroid-induced hyperglycemia Continue 60 units Semglee + 6 units of NovoLog with meals+ SSI Follow/adjust  HTN BP stable Avapro  Hypothyroidism Synthroid Recent TSH 8/9-stable  HLD Zetia  COPD Not in exacerbation Bronchodilators  Obesity: Estimated body mass index is 37.28 kg/m as calculated from the following:   Height as of this encounter: 5\' 6"  (1.676 m).   Weight as of this encounter: 104.8 kg.   Code status:   Code Status: Full Code   DVT Prophylaxis: enoxaparin (LOVENOX) injection 40 mg Start: 09/23/22 1500   Family Communication: None at bedside   Disposition Plan: Status is: Inpatient Remains inpatient appropriate because: Severity of illness   Planned Discharge Destination:Home   Diet: Diet Order             Diet Carb Modified Fluid consistency: Thin; Room service appropriate? Yes  Diet effective now                     Antimicrobial agents: Anti-infectives (From admission, onward)    Start     Dose/Rate  Route Frequency Ordered Stop   09/28/22 1000  azithromycin (ZITHROMAX) tablet 500 mg        500 mg Oral Daily 09/27/22 1419 09/28/22 0911   09/24/22 1000  remdesivir 100 mg in sodium chloride 0.9 % 100 mL IVPB       Placed in "Followed by" Linked Group   100 mg 200 mL/hr over 30 Minutes Intravenous Daily 09/23/22 1551 09/25/22 1242   09/24/22 1000  cefTRIAXone (ROCEPHIN) 2 g in sodium chloride 0.9 % 100 mL IVPB        2 g 200 mL/hr over 30 Minutes Intravenous Every 24 hours 09/24/22 0713 09/28/22 0950   09/24/22 1000  azithromycin (ZITHROMAX) 500 mg in sodium chloride 0.9 % 250 mL IVPB  Status:  Discontinued         500 mg 250 mL/hr over 60 Minutes Intravenous Every 24 hours 09/24/22 0713 09/27/22 1419   09/23/22 1645  remdesivir 200 mg in sodium chloride 0.9% 250 mL IVPB       Placed in "Followed by" Linked Group   200 mg 580 mL/hr over 30 Minutes Intravenous Once 09/23/22 1551 09/23/22 2213   09/23/22 1415  cefTRIAXone (ROCEPHIN) 1 g in sodium chloride 0.9 % 100 mL IVPB        1 g 200 mL/hr over 30 Minutes Intravenous  Once 09/23/22 1407 09/23/22 1608   09/23/22 1415  azithromycin (ZITHROMAX) 500 mg in sodium chloride 0.9 % 250 mL IVPB        500 mg 250 mL/hr over 60 Minutes Intravenous  Once 09/23/22 1407 09/23/22 1727        MEDICATIONS: Scheduled Meds:  vitamin C  500 mg Oral Daily   aspirin EC  81 mg Oral Daily   cholecalciferol  1,000 Units Oral Daily   dapagliflozin propanediol  5 mg Oral Daily   dexamethasone  6 mg Oral Daily   enoxaparin (LOVENOX) injection  40 mg Subcutaneous Q24H   ezetimibe  10 mg Oral Daily   fluticasone furoate-vilanterol  1 puff Inhalation Daily   furosemide  40 mg Intravenous Daily   guaiFENesin  600 mg Oral BID   insulin aspart  0-20 Units Subcutaneous TID WC   insulin aspart  0-5 Units Subcutaneous QHS   insulin aspart  6 Units Subcutaneous TID WC   insulin glargine-yfgn  60 Units Subcutaneous Daily   irbesartan  75 mg Oral Daily   levothyroxine  50 mcg Oral Q0600   multivitamin with minerals  1 tablet Oral Daily   pantoprazole  40 mg Oral BID   sodium chloride flush  3 mL Intravenous Q12H   sodium chloride flush  3 mL Intravenous Q12H   umeclidinium bromide  1 puff Inhalation Daily   Continuous Infusions:  sodium chloride     PRN Meds:.sodium chloride, acetaminophen **OR** acetaminophen, albuterol, benzonatate, bisacodyl, guaiFENesin-dextromethorphan, hydrALAZINE, menthol-cetylpyridinium, polyethylene glycol, sodium chloride, sodium chloride flush   I have personally reviewed following labs and imaging studies  LABORATORY  DATA: CBC: Recent Labs  Lab 09/23/22 1159 09/24/22 0453 09/25/22 1030 09/26/22 0719 09/27/22 0532 09/28/22 0413 09/29/22 0621  WBC 3.0*   < > 3.8* 4.0 3.9* 4.9 5.2  NEUTROABS 2.1  --   --   --   --   --   --   HGB 10.1*   < > 9.1* 8.9* 8.9* 8.9* 8.7*  HCT 31.0*   < > 28.2* 27.4* 27.4* 27.4* 27.1*  MCV 76.9*   < > 75.4* 74.7* 74.9*  74.7* 75.7*  PLT 136*   < > 144* 149* 161 168 185   < > = values in this interval not displayed.    Basic Metabolic Panel: Recent Labs  Lab 09/23/22 1159 09/24/22 0453 09/25/22 1030 09/26/22 0719 09/27/22 0532 09/28/22 0413 09/29/22 0621  NA 129* 129* 131* 132* 133* 135 132*  K 3.3* 3.9 3.6 3.7 3.8 3.9 3.8  CL 94* 95* 98 96* 97* 101 97*  CO2 23 22 21* 25 23 24 25   GLUCOSE 218* 438* 386* 373* 293* 178* 179*  BUN 14 17 26* 29* 34* 31* 32*  CREATININE 1.25* 1.21* 1.35* 1.35* 1.27* 1.05* 1.13*  CALCIUM 8.6* 8.3* 8.5* 9.2 9.1 9.6 9.2  MG 1.3* 2.7* 2.0 1.8  --   --   --   PHOS 1.8* 5.5* 3.5 3.9  --   --   --     GFR: Estimated Creatinine Clearance: 52.6 mL/min (A) (by C-G formula based on SCr of 1.13 mg/dL (H)).  Liver Function Tests: Recent Labs  Lab 09/23/22 1159 09/24/22 0453 09/25/22 1030 09/26/22 0719  AST 45* 33 30 26  ALT 28 24 23 23   ALKPHOS 47 40 47 40  BILITOT 0.8 0.5 0.4 0.5  PROT 6.8 5.5* 6.0* 6.2*  ALBUMIN 3.3* 2.6* 2.8* 2.9*   No results for input(s): "LIPASE", "AMYLASE" in the last 168 hours. No results for input(s): "AMMONIA" in the last 168 hours.  Coagulation Profile: Recent Labs  Lab 09/23/22 1159  INR 1.2    Cardiac Enzymes: No results for input(s): "CKTOTAL", "CKMB", "CKMBINDEX", "TROPONINI" in the last 168 hours.  BNP (last 3 results) No results for input(s): "PROBNP" in the last 8760 hours.  Lipid Profile: No results for input(s): "CHOL", "HDL", "LDLCALC", "TRIG", "CHOLHDL", "LDLDIRECT" in the last 72 hours.  Thyroid Function Tests: No results for input(s): "TSH", "T4TOTAL", "FREET4", "T3FREE",  "THYROIDAB" in the last 72 hours.  Anemia Panel: No results for input(s): "VITAMINB12", "FOLATE", "FERRITIN", "TIBC", "IRON", "RETICCTPCT" in the last 72 hours.  Urine analysis:    Component Value Date/Time   COLORURINE YELLOW 09/24/2022 0600   APPEARANCEUR HAZY (A) 09/24/2022 0600   LABSPEC 1.027 09/24/2022 0600   PHURINE 5.0 09/24/2022 0600   GLUCOSEU >=500 (A) 09/24/2022 0600   HGBUR NEGATIVE 09/24/2022 0600   BILIRUBINUR NEGATIVE 09/24/2022 0600   KETONESUR NEGATIVE 09/24/2022 0600   PROTEINUR NEGATIVE 09/24/2022 0600   NITRITE NEGATIVE 09/24/2022 0600   LEUKOCYTESUR NEGATIVE 09/24/2022 0600    Sepsis Labs: Lactic Acid, Venous    Component Value Date/Time   LATICACIDVEN 1.5 09/23/2022 1431    MICROBIOLOGY: Recent Results (from the past 240 hour(s))  Culture, blood (Routine x 2)     Status: None   Collection Time: 09/23/22 11:59 AM   Specimen: BLOOD LEFT ARM  Result Value Ref Range Status   Specimen Description BLOOD LEFT ARM  Final   Special Requests   Final    BOTTLES DRAWN AEROBIC AND ANAEROBIC Blood Culture adequate volume   Culture   Final    NO GROWTH 5 DAYS Performed at [facility], [address] [place], [place] [address]    Report Status 09/28/2022 FINAL  Final  Resp panel by RT-PCR (RSV, Flu A&B, Covid) Anterior Nasal Swab     Status: Abnormal   Collection Time: 09/23/22 11:59 AM   Specimen: Anterior Nasal Swab  Result Value Ref Range Status   SARS Coronavirus 2 by RT PCR POSITIVE (A) NEGATIVE Final   Influenza  A by PCR NEGATIVE NEGATIVE Final   Influenza B by PCR NEGATIVE NEGATIVE Final    Comment: (NOTE) The Xpert Xpress SARS-CoV-2/FLU/RSV plus assay is intended as an aid in the diagnosis of influenza from Nasopharyngeal swab specimens and should not be used as a sole basis for treatment. Nasal washings and aspirates are unacceptable for Xpert Xpress SARS-CoV-2/FLU/RSV testing.  Fact Sheet for  Patients: BloggerCourse.com  Fact Sheet for Healthcare Providers: SeriousBroker.it  This test is not yet approved or cleared by the [place] FDA and has been authorized for detection and/or diagnosis of SARS-CoV-2 by FDA under an Emergency Use Authorization (EUA). This EUA will remain in effect (meaning this test can be used) for the duration of the COVID-19 declaration under Section 564(b)(1) of the Act, 21 U.S.C. section 360bbb-3(b)(1), unless the authorization is terminated or revoked.     Resp Syncytial Virus by PCR NEGATIVE NEGATIVE Final    Comment: (NOTE) Fact Sheet for Patients: BloggerCourse.com  Fact Sheet for Healthcare Providers: SeriousBroker.it  This test is not yet approved or cleared by the [place] FDA and has been authorized for detection and/or diagnosis of SARS-CoV-2 by FDA under an Emergency Use Authorization (EUA). This EUA will remain in effect (meaning this test can be used) for the duration of the COVID-19 declaration under Section 564(b)(1) of the Act, 21 U.S.C. section 360bbb-3(b)(1), unless the authorization is terminated or revoked.  Performed at [facility], [address] [place], [place] [address]   Culture, blood (Routine x 2)     Status: None   Collection Time: 09/23/22  1:30 PM   Specimen: BLOOD RIGHT ARM  Result Value Ref Range Status   Specimen Description BLOOD RIGHT ARM  Final   Special Requests   Final    BOTTLES DRAWN AEROBIC AND ANAEROBIC Blood Culture adequate volume   Culture   Final    NO GROWTH 5 DAYS Performed at [facility], [address], [place], [place] [address]    Report Status 09/28/2022 FINAL  Final    RADIOLOGY STUDIES/RESULTS: DG Chest Port 1 View  Result Date: 09/28/2022 CLINICAL DATA:  Shortness of breath, COVID positive EXAM: PORTABLE CHEST - 1 VIEW COMPARISON:  09/23/2022 FINDINGS:  Persistent bibasilar interstitial and airspace opacities left greater than right. Worsening right upper lobe opacities. Heart size and mediastinal contours are within normal limits. Aortic Atherosclerosis (ICD10-170.0). No effusion. Visualized bones unremarkable. IMPRESSION: Persistent bibasilar and worsening right upper lobe opacities. Electronically Signed   By: Corlis Leak M.D.   On: 09/28/2022 14:18   DG Swallowing Func-Speech Pathology  Result Date: 09/27/2022 Table formatting from the original result was not included. Modified Barium Swallow Study Patient Details Name: KRISTENA BALIAN MRN: [id number] Date of Birth: 13-Feb-1945 Today's Date: 09/27/2022 HPI/PMH: HPI: 77 y.o. female presented at [facility], ED with complaining of fever, worsening of shortness of breath and productive cough. +COVID pna,  Past medical history of HTN, HLD, IDDM T2, COPD, hypothyroid, OSA, GERD, cavitary lung mass Clinical Impression: Clinical Impression: Pt demonstrated normal oropharyngeal swallow function. Possible esophageal dysphagia is suspected. She coughed once prior to po's likely from Covid diagnosis. Her tongue control, bolus preparation/mastication and transport were all within functional limits. Swallows were initiated timely with adequate strength to prevent aspiration. During pill consumption there was one trace, flash penetration of thin liquid that exited vestibule during the swallow. All boluses moved through her pharynx into upper esophageal sphincter without any residual. The pill stopped at the  GE junction and did not move into the stomach given additional thin liquid. There was mild, possibly moderate residue seen in her esophagus at end of study which may account for her complaint of coughing at the end of meals. Recommend continue regular texture, thin liquids, pills with thin, stay sitting upright after meals, alternate liquids and solids and therapist educated pt on strategies. Pt states she sleeps in a  reclined position at home due to her reflux. No further ST warranted at this time. Factors that may increase risk of adverse event in presence of aspiration [name] [employer] [name] 2021): No data recorded Recommendations/Plan: Swallowing Evaluation Recommendations Swallowing Evaluation Recommendations Recommendations: PO diet PO Diet Recommendation: Regular; Thin liquids (Level 0) Liquid Administration via: Cup; Straw Medication Administration: Whole meds with liquid Supervision: Patient able to self-feed Swallowing strategies  : Small bites/sips; Slow rate Postural changes: Stay upright 30-60 min after meals; Position pt fully upright for meals Oral care recommendations: Oral care BID (2x/day) Treatment Plan Treatment Plan Treatment recommendations: No treatment recommended at this time Follow-up recommendations: No SLP follow up Functional status assessment: Patient has not had a recent decline in their functional status. Recommendations Recommendations for follow up therapy are one component of a multi-disciplinary discharge planning process, led by the attending physician.  Recommendations may be updated based on patient status, additional functional criteria and insurance authorization. Assessment: Orofacial Exam: Orofacial Exam Oral Cavity: Oral Hygiene: WFL Oral Cavity - Dentition: Dentures, top; Dentures, bottom Orofacial Anatomy: WFL Oral Motor/Sensory Function: WFL Anatomy: Anatomy: WFL Boluses Administered: Boluses Administered Boluses Administered: Thin liquids (Level 0); Mildly thick liquids (Level 2, nectar thick); Moderately thick liquids (Level 3, honey thick); Puree; Solid  Oral Impairment Domain: Oral Impairment Domain Lip Closure: No labial escape Tongue control during bolus hold: Cohesive bolus between tongue to palatal seal Bolus preparation/mastication: Timely and efficient chewing and mashing Bolus transport/lingual motion: Brisk tongue motion Oral residue: Complete oral clearance Location of oral  residue : N/A Initiation of pharyngeal swallow : Valleculae  Pharyngeal Impairment Domain: Pharyngeal Impairment Domain Soft palate elevation: No bolus between soft palate (SP)/pharyngeal wall (PW) Laryngeal elevation: Complete superior movement of thyroid cartilage with complete approximation of arytenoids to epiglottic petiole Anterior hyoid excursion: Complete anterior movement Epiglottic movement: Complete inversion Laryngeal vestibule closure: Complete, no air/contrast in laryngeal vestibule Pharyngeal stripping wave : Present - complete Pharyngeal contraction (A/P view only): N/A Pharyngoesophageal segment opening: Complete distension and complete duration, no obstruction of flow Tongue base retraction: No contrast between tongue base and posterior pharyngeal wall (PPW) Pharyngeal residue: Complete pharyngeal clearance Location of pharyngeal residue: N/A  Esophageal Impairment Domain: Esophageal Impairment Domain Esophageal clearance upright position: Esophageal retention Pill: Pill Consistency administered: Thin liquids (Level 0) Thin liquids (Level 0): -- (flash penetration with thin while taking pill) Penetration/Aspiration Scale Score: Penetration/Aspiration Scale Score 1.  Material does not enter airway: Mildly thick liquids (Level 2, nectar thick); Moderately thick liquids (Level 3, honey thick); Puree; Solid 2.  Material enters airway, remains ABOVE vocal cords then ejected out: Thin liquids (Level 0) Compensatory Strategies: Compensatory Strategies Compensatory strategies: No   General Information: Caregiver present: No  Diet Prior to this Study: Regular; Thin liquids (Level 0)   Temperature : Normal   Respiratory Status: WFL   Supplemental O2: Nasal cannula   History of Recent Intubation: No  Behavior/Cognition: Alert; Cooperative; Pleasant mood Self-Feeding Abilities: Able to self-feed Baseline vocal quality/speech: Normal Volitional Cough: Able to elicit Volitional Swallow: Able to elicit Exam  Limitations:  No limitations Goal Planning: No data recorded No data recorded No data recorded No data recorded Consulted and agree with results and recommendations: Patient Pain: Pain Assessment Pain Assessment: -- (some pain with moving to dysphagia chair) Breathing: 0 Negative Vocalization: 0 Facial Expression: 0 Facial Expression: 0 End of Session: Start Time:SLP Start Time (ACUTE ONLY): 1414 Stop Time: SLP Stop Time (ACUTE ONLY): 1427 Time Calculation:SLP Time Calculation (min) (ACUTE ONLY): 13 min Charges: SLP Evaluations $ SLP Speech Visit: 1 Visit SLP Evaluations $MBS Swallow: 1 Procedure SLP visit diagnosis: SLP Visit Diagnosis: Dysphagia, unspecified (R13.10) Past Medical History: Past Medical History: Diagnosis Date  Arthritis   knee and shoulders  Colon polyps 09/05/2017  COPD (chronic obstructive pulmonary disease) (HCC)   Diabetes mellitus without complication (HCC)   type II  Dyspnea   05/16/20 has had a cough for 2.5 years post Covid  Gastritis 09/05/2017  GERD (gastroesophageal reflux disease)   Hyperlipidemia   Hypertension   patient denies  Hypothyroidism   Panic attack   RUQ pain 03/09/2017  Per patient, she has had RUQ pain 4-5 years that has grown worse in the last few months. Past Surgical History: Past Surgical History: Procedure Laterality Date  BREAST BIOPSY Left 2008  CORE W/CLIP - NEG  BRONCHIAL BIOPSY  05/19/2020  Procedure: BRONCHIAL BIOPSIES;  Surgeon: Leslye Peer, MD;  Location[facility] ENDOSCOPY;  Service: Pulmonary;;  BRONCHIAL BRUSHINGS  05/19/2020  Procedure: BRONCHIAL BRUSHINGS;  Surgeon: Leslye Peer, MD;  Location: [facility] ENDOSCOPY;  Service: Pulmonary;;  BRONCHIAL NEEDLE ASPIRATION BIOPSY  05/19/2020  Procedure: BRONCHIAL NEEDLE ASPIRATION BIOPSIES;  Surgeon: Leslye Peer, MD;  Location: [facility] ENDOSCOPY;  Service: Pulmonary;;  BRONCHIAL WASHINGS  05/19/2020  Procedure: BRONCHIAL WASHINGS;  Surgeon: Leslye Peer, MD;  Location: [facility] ENDOSCOPY;  Service: Pulmonary;;  COLONOSCOPY WITH  PROPOFOL N/A 02/08/2017  Procedure: COLONOSCOPY WITH PROPOFOL;  Surgeon: Christena Deem, MD;  Location: [facility] ENDOSCOPY;  Service: Endoscopy;  Laterality: N/A;  COLONOSCOPY WITH PROPOFOL N/A 05/05/2022  Procedure: COLONOSCOPY WITH PROPOFOL;  Surgeon: Toney Reil, MD;  Location: [facility] ENDOSCOPY;  Service: Gastroenterology;  Laterality: N/A;  ESOPHAGOGASTRODUODENOSCOPY (EGD) WITH PROPOFOL N/A 02/08/2017  Procedure: ESOPHAGOGASTRODUODENOSCOPY (EGD) WITH PROPOFOL;  Surgeon: Christena Deem, MD;  Location: [facility] ENDOSCOPY;  Service: Endoscopy;  Laterality: N/A;  TUBAL LIGATION    VIDEO BRONCHOSCOPY WITH ENDOBRONCHIAL NAVIGATION N/A 05/19/2020  Procedure: VIDEO BRONCHOSCOPY WITH ENDOBRONCHIAL NAVIGATION;  Surgeon: Leslye Peer, MD;  Location: [facility] ENDOSCOPY;  Service: Pulmonary;  Laterality: N/A; [name] 09/27/2022, 3:06 PM    LOS: 6 days   Jeoffrey Massed, MD  [facility]    To contact the attending provider between 7A-7P or the covering provider during after hours 7P-7A, please log into the web site [email or web address] and access using universal [place] password for that web site. If you do not have the password, please call the hospital operator.  09/29/2022, 9:49 AM

## 2022-09-30 ENCOUNTER — Other Ambulatory Visit (HOSPITAL_COMMUNITY): Payer: Self-pay

## 2022-09-30 DIAGNOSIS — J189 Pneumonia, unspecified organism: Secondary | ICD-10-CM | POA: Diagnosis not present

## 2022-09-30 DIAGNOSIS — R0902 Hypoxemia: Secondary | ICD-10-CM | POA: Diagnosis not present

## 2022-09-30 DIAGNOSIS — U071 COVID-19: Secondary | ICD-10-CM | POA: Diagnosis not present

## 2022-09-30 DIAGNOSIS — R600 Localized edema: Secondary | ICD-10-CM

## 2022-09-30 LAB — GLUCOSE, CAPILLARY
Glucose-Capillary: 117 mg/dL — ABNORMAL HIGH (ref 70–99)
Glucose-Capillary: 217 mg/dL — ABNORMAL HIGH (ref 70–99)

## 2022-09-30 MED ORDER — DEXAMETHASONE 6 MG PO TABS
6.0000 mg | ORAL_TABLET | Freq: Every day | ORAL | 0 refills | Status: AC
  Filled 2022-09-30: qty 3, 3d supply, fill #0

## 2022-09-30 MED ORDER — FUROSEMIDE 40 MG PO TABS
40.0000 mg | ORAL_TABLET | Freq: Every day | ORAL | 1 refills | Status: DC
  Filled 2022-09-30: qty 90, 90d supply, fill #0

## 2022-09-30 NOTE — TOC Transition Note (Addendum)
Transition of Care [facility]) - CM/SW Discharge Note   Patient Details  Name: Tamara [name] MRN: [id number] Date of Birth: 06-24-1945  Transition of Care [facility]) CM/SW Contact:  Lockie Pares, RN Phone Number: 09/30/2022, 9:19 AM   Clinical Narrative:     Patient will need home oxygen, Called and spoke to patient. Patient states she cannot afford home oxygen. Called [employer] and spoke to them regarding cost. They will put it through insurance and call this RNCM back with costs.  1240 Messaged with Nursing, oxygen not there, messaged with [employer] again, they are checking with their specialist with cost and delivery, patient inquired about inogen, updated patient on [employer] 1420 Oxygen delivered spoke to patient again about set up at home and she can get a concentrator/ mobile oxygen from [employer]. They ar4e aware and will bring it with set up to home. Final next level of care: Home/Self Care Barriers to Discharge: No Barriers Identified   Patient Goals and CMS Choice      Discharge Placement                         Discharge Plan and Services Additional resources added to the After Visit Summary for                                       Social Determinants of Health (SDOH) Interventions SDOH Screenings   Food Insecurity: No Food Insecurity (09/23/2022)  Housing: Medium Risk (09/23/2022)  Transportation Needs: No Transportation Needs (09/23/2022)  Utilities: Not At Risk (09/23/2022)  Alcohol Screen: Low Risk  (10/16/2020)  Depression (PHQ2-9): Low Risk  (05/31/2022)  Financial Resource Strain: Medium Risk (10/20/2021)  Physical Activity: Inactive (10/20/2021)  Social Connections: Moderately Integrated (10/20/2021)  Stress: Stress Concern Present (10/20/2021)  Tobacco Use: Medium Risk (09/23/2022)     Readmission Risk Interventions     No data to display

## 2022-09-30 NOTE — Discharge Summary (Signed)
PATIENT DETAILS Name: Tamara [name] Age: 77 y.o. Sex: female Date of Birth: 12-12-1945 MRN: [id number]. Admitting Physician: Gillis Santa, MD [id number], Gentry Fitz, DO  Admit Date: 09/23/2022 Discharge date: 09/30/2022  Recommendations for Outpatient Follow-up:  Follow up with PCP in 1-2 weeks Please obtain CMP/CBC in one week Please ensure follow-up with pulmonology Reassess need for home O2 at next visit   Admitted From:  Home  Disposition: Home   Discharge Condition: good  CODE STATUS:   Code Status: Full Code   Diet recommendation:  Diet Order             Diet - low sodium heart healthy           Diet Carb Modified Fluid consistency: Thin; Room service appropriate? Yes  Diet effective now                    Brief Summary: Patient is a 77 y.o.  female who with a history of HTN, HLD, DM-2, COPD, OSA, GERD, who presented with fever, shortness of breath, cough-she was found to have acute hypoxic respiratory failure due to COVID-19 PNA.   Significant events: 9/5>> admit to [facility]   Significant studies: 9/5>> CT angio chest: No PE, multilobar pneumonia   Significant microbiology data: 9/5>> COVID-19 PCR: Positive 9/5>> influenza/RSV PCR: Negative 9/5>> blood culture: No growth   Procedures: None   Consults: Pulmonology    Brief Hospital Course: Severe sepsis with acute hypoxic respiratory failure due to COVID PNA Sepsis physiology improved Overall slowly improving-requires minimal amount of oxygen at rest-sometimes on room air-sometimes requiring 1-2 L, but does require around 3L of oxygen with mobility.Although slightly hypoxic-is not in any sort of respiratory distress. Euvolemic on exam-was given IV Lasix to keep negative balance Completed empiric Rocephin/Zithromax x 5 days Plan is to continue Decadron on discharge-x 10 days total Will require home O2 on discharge Already has follow-up with Dr. Delton Coombes on 9/25   Pancytopenia Secondary to  above Improving   History of LLL cavitary mass Known history of lung mass-negative prior bronch Per PCCM consult note-PET scan is planned upon follow-up as an outpatient.   DM-2 with steroid-induced hyperglycemia She wishes to go back on her usual regimen of NPH  Resume metformin/Farxiga on discharge.     HTN BP stable Avapro Stopping HCTZ and continuing Lasix on discharge   Hypothyroidism Synthroid Recent TSH 8/9-stable   HLD Zetia   COPD Not in exacerbation Bronchodilators   Obesity: Estimated body mass index is 37.28 kg/m as calculated from the following:   Height as of this encounter: 5\' 6"  (1.676 m).   Weight as of this encounter: 104.8 kg.    Discharge Diagnoses:  Principal Problem:   Pneumonia due to COVID-19 virus   Discharge Instructions:  Activity:  As tolerated Discharge Instructions     Diet - low sodium heart healthy   Complete by: As directed    Discharge instructions   Complete by: As directed    Follow with Primary MD  Margarita Mail, DO in 1-2 weeks  Follow with Dr. Joylene Draft have a upcoming appointment on 9/25 at 10:30 AM  Use home O2 as prescribed-24/7  Please get a complete blood count and chemistry panel checked by your Primary MD at your next visit, and again as instructed by your Primary MD.  Get Medicines reviewed and adjusted: Please take all your medications with you for your next visit with your Primary MD  Laboratory/radiological data: Please request your  Primary MD to go over all hospital tests and procedure/radiological results at the follow up, please ask your Primary MD to get all Hospital records sent to his/her office.  In some cases, they will be blood work, cultures and biopsy results pending at the time of your discharge. Please request that your primary care M.D. follows up on these results.  Also Note the following: If you experience worsening of your admission symptoms, develop shortness of breath, life  threatening emergency, suicidal or homicidal thoughts you must seek medical attention immediately by calling 911 or calling your MD immediately  if symptoms less severe.  You must read complete instructions/literature along with all the possible adverse reactions/side effects for all the Medicines you take and that have been prescribed to you. Take any new Medicines after you have completely understood and accpet all the possible adverse reactions/side effects.   Do not drive when taking Pain medications or sleeping medications (Benzodaizepines)  Do not take more than prescribed Pain, Sleep and Anxiety Medications. It is not advisable to combine anxiety,sleep and pain medications without talking with your primary care practitioner  Special Instructions: If you have smoked or chewed Tobacco  in the last 2 yrs please stop smoking, stop any regular Alcohol  and or any Recreational drug use.  Wear Seat belts while driving.  Please note: You were cared for by a hospitalist during your hospital stay. Once you are discharged, your primary care physician will handle any further medical issues. Please note that NO REFILLS for any discharge medications will be authorized once you are discharged, as it is imperative that you return to your primary care physician (or establish a relationship with a primary care physician if you do not have one) for your post hospital discharge needs so that they can reassess your need for medications and monitor your lab values.   Increase activity slowly   Complete by: As directed       Allergies as of 09/30/2022       Reactions   Penicillins Hives   Tolerated rocephin 09/2022 Reaction: 1965        Medication List     STOP taking these medications    hydrochlorothiazide 25 MG tablet Commonly known as: HYDRODIURIL   levofloxacin 500 MG tablet Commonly known as: LEVAQUIN   predniSONE 10 MG tablet Commonly known as: DELTASONE   spironolactone 25 MG  tablet Commonly known as: ALDACTONE       TAKE these medications    Accu-Chek Softclix Lancets lancets TEST BLOOD SUGAR TWO TIMES DAILY   albuterol 108 (90 Base) MCG/ACT inhaler Commonly known as: VENTOLIN HFA Inhale 2 puffs into the lungs every 6 (six) hours as needed for wheezing or shortness of breath.   albuterol (2.5 MG/3ML) 0.083% nebulizer solution Commonly known as: PROVENTIL Take 3 mLs (2.5 mg total) by nebulization every 6 (six) hours as needed for wheezing or shortness of breath.   aspirin EC 81 MG tablet Take 1 tablet (81 mg total) by mouth daily. Swallow whole.   dapagliflozin propanediol 5 MG Tabs tablet Commonly known as: Farxiga Take 1 tablet (5 mg total) by mouth daily before breakfast.   dexamethasone 6 MG tablet Commonly known as: DECADRON Take 1 tablet (6 mg total) by mouth daily for 3 doses. Start taking on: October 01, 2022   Droplet Pen Needles 32G X 4 MM Misc Generic drug: Insulin Pen Needle 1 Device by Other route daily.   DropSafe Alcohol Prep 70 % Pads USE  AS DIRECTED TWICE DAILY WHEN TESTING BLOOD SUGAR   ezetimibe 10 MG tablet Commonly known as: ZETIA Take 1 tablet (10 mg total) by mouth daily.   furosemide 40 MG tablet Commonly known as: LASIX Take 1 tablet (40 mg total) by mouth daily. What changed:  when to take this reasons to take this   gabapentin 300 MG capsule Commonly known as: NEURONTIN Take 300 mg by mouth as needed.   levothyroxine 50 MCG tablet Commonly known as: SYNTHROID TAKE 1 TABLET EVERY DAY BEFORE BREAKFAST   meloxicam 15 MG tablet Commonly known as: MOBIC TAKE 1/2 TO 1 TABLET EVERY DAY AS NEEDED FOR PAIN   metFORMIN 1000 MG tablet Commonly known as: GLUCOPHAGE Take 1 tablet (1,000 mg total) by mouth 2 (two) times daily.   NovoLIN N FlexPen 100 UNIT/ML FlexPen Generic drug: Insulin NPH (Human) (Isophane) 36 units in am and 28-30 units at bedtime   nystatin powder Commonly known as:  MYCOSTATIN/NYSTOP Apply 1 Application topically daily as needed (yeast under belly).   pantoprazole 40 MG tablet Commonly known as: PROTONIX TAKE 1 TABLET TWICE DAILY   potassium chloride 10 MEQ tablet Commonly known as: KLOR-CON M Take 1 tablet (10 mEq total) by mouth daily.   rosuvastatin 20 MG tablet Commonly known as: CRESTOR TAKE 1 TABLET AT BEDTIME   Stiolto Respimat 2.5-2.5 MCG/ACT Aers Generic drug: Tiotropium Bromide-Olodaterol Inhale 2 puffs into the lungs daily.   True Metrix Blood Glucose Test test strip Generic drug: glucose blood Use 2x a day   valsartan 80 MG tablet Commonly known as: DIOVAN TAKE 1 TABLET (80 MG TOTAL) BY MOUTH DAILY.   vitamin C 1000 MG tablet Take 1,000 mg by mouth daily.   Vitamin D 50 MCG (2000 UT) Caps Take 1,000 Units by mouth daily.               Durable Medical Equipment  (From admission, onward)           Start     Ordered   09/30/22 0851  For home use only DME oxygen  Once       Comments: 2L at rest 3L with ambulation  Question Answer Comment  Length of Need 6 Months   Mode or (Route) Nasal cannula   Liters per Minute 3   Frequency Continuous (stationary and portable oxygen unit needed)   Oxygen conserving device Yes   Oxygen delivery system Gas      09/30/22 0855            Follow-up Information     Margarita Mail, DO. Schedule an appointment as soon as possible for a visit in 1 week(s).   Specialty: Internal Medicine Contact information: [address] [place] [address] [phone]         Leslye Peer, MD Follow up on 10/13/2022.   Specialty: Pulmonary Disease Why: appointment at 10:30 am Contact information: [address] [place] [address] [phone]                Allergies  Allergen Reactions   Penicillins Hives    Tolerated rocephin 09/2022 Reaction: 1964     Other Procedures/Studies: DG Chest Port 1 View  Result  Date: 09/28/2022 CLINICAL DATA:  Shortness of breath, COVID positive EXAM: PORTABLE CHEST - 1 VIEW COMPARISON:  09/23/2022 FINDINGS: Persistent bibasilar interstitial and airspace opacities left greater than right. Worsening right upper lobe opacities. Heart size and mediastinal contours are within normal limits.  Aortic Atherosclerosis (ICD10-170.0). No effusion. Visualized bones unremarkable. IMPRESSION: Persistent bibasilar and worsening right upper lobe opacities. Electronically Signed   By: Corlis Leak M.D.   On: 09/28/2022 14:18   DG Swallowing Func-Speech Pathology  Result Date: 09/27/2022 Table formatting from the original result was not included. Modified Barium Swallow Study Patient Details Name: ELINOR BRADO MRN: [id number] Date of Birth: 06/09/1945 Today's Date: 09/27/2022 HPI/PMH: HPI: 77 y.o. female presented at [facility], ED with complaining of fever, worsening of shortness of breath and productive cough. +COVID pna,  Past medical history of HTN, HLD, IDDM T2, COPD, hypothyroid, OSA, GERD, cavitary lung mass Clinical Impression: Clinical Impression: Pt demonstrated normal oropharyngeal swallow function. Possible esophageal dysphagia is suspected. She coughed once prior to po's likely from Covid diagnosis. Her tongue control, bolus preparation/mastication and transport were all within functional limits. Swallows were initiated timely with adequate strength to prevent aspiration. During pill consumption there was one trace, flash penetration of thin liquid that exited vestibule during the swallow. All boluses moved through her pharynx into upper esophageal sphincter without any residual. The pill stopped at the GE junction and did not move into the stomach given additional thin liquid. There was mild, possibly moderate residue seen in her esophagus at end of study which may account for her complaint of coughing at the end of meals. Recommend continue regular texture, thin liquids, pills with thin,  stay sitting upright after meals, alternate liquids and solids and therapist educated pt on strategies. Pt states she sleeps in a reclined position at home due to her reflux. No further ST warranted at this time. Factors that may increase risk of adverse event in presence of aspiration [name] & [name] 2021): No data recorded Recommendations/Plan: Swallowing Evaluation Recommendations Swallowing Evaluation Recommendations Recommendations: PO diet PO Diet Recommendation: Regular; Thin liquids (Level 0) Liquid Administration via: Cup; Straw Medication Administration: Whole meds with liquid Supervision: Patient able to self-feed Swallowing strategies  : Small bites/sips; Slow rate Postural changes: Stay upright 30-60 min after meals; Position pt fully upright for meals Oral care recommendations: Oral care BID (2x/day) Treatment Plan Treatment Plan Treatment recommendations: No treatment recommended at this time Follow-up recommendations: No SLP follow up Functional status assessment: Patient has not had a recent decline in their functional status. Recommendations Recommendations for follow up therapy are one component of a multi-disciplinary discharge planning process, led by the attending physician.  Recommendations may be updated based on patient status, additional functional criteria and insurance authorization. Assessment: Orofacial Exam: Orofacial Exam Oral Cavity: Oral Hygiene: WFL Oral Cavity - Dentition: Dentures, top; Dentures, bottom Orofacial Anatomy: WFL Oral Motor/Sensory Function: WFL Anatomy: Anatomy: WFL Boluses Administered: Boluses Administered Boluses Administered: Thin liquids (Level 0); Mildly thick liquids (Level 2, nectar thick); Moderately thick liquids (Level 3, honey thick); Puree; Solid  Oral Impairment Domain: Oral Impairment Domain Lip Closure: No labial escape Tongue control during bolus hold: Cohesive bolus between tongue to palatal seal Bolus preparation/mastication: Timely and  efficient chewing and mashing Bolus transport/lingual motion: Brisk tongue motion Oral residue: Complete oral clearance Location of oral residue : N/A Initiation of pharyngeal swallow : Valleculae  Pharyngeal Impairment Domain: Pharyngeal Impairment Domain Soft palate elevation: No bolus between soft palate (SP)/pharyngeal wall (PW) Laryngeal elevation: Complete superior movement of thyroid cartilage with complete approximation of arytenoids to epiglottic petiole Anterior hyoid excursion: Complete anterior movement Epiglottic movement: Complete inversion Laryngeal vestibule closure: Complete, no air/contrast in laryngeal vestibule Pharyngeal stripping wave : Present - complete Pharyngeal contraction (A/P  view only): N/A Pharyngoesophageal segment opening: Complete distension and complete duration, no obstruction of flow Tongue base retraction: No contrast between tongue base and posterior pharyngeal wall (PPW) Pharyngeal residue: Complete pharyngeal clearance Location of pharyngeal residue: N/A  Esophageal Impairment Domain: Esophageal Impairment Domain Esophageal clearance upright position: Esophageal retention Pill: Pill Consistency administered: Thin liquids (Level 0) Thin liquids (Level 0): -- (flash penetration with thin while taking pill) Penetration/Aspiration Scale Score: Penetration/Aspiration Scale Score 1.  Material does not enter airway: Mildly thick liquids (Level 2, nectar thick); Moderately thick liquids (Level 3, honey thick); Puree; Solid 2.  Material enters airway, remains ABOVE vocal cords then ejected out: Thin liquids (Level 0) Compensatory Strategies: Compensatory Strategies Compensatory strategies: No   General Information: Caregiver present: No  Diet Prior to this Study: Regular; Thin liquids (Level 0)   Temperature : Normal   Respiratory Status: WFL   Supplemental O2: Nasal cannula   History of Recent Intubation: No  Behavior/Cognition: Alert; Cooperative; Pleasant mood Self-Feeding  Abilities: Able to self-feed Baseline vocal quality/speech: Normal Volitional Cough: Able to elicit Volitional Swallow: Able to elicit Exam Limitations: No limitations Goal Planning: No data recorded No data recorded No data recorded No data recorded Consulted and agree with results and recommendations: Patient Pain: Pain Assessment Pain Assessment: -- (some pain with moving to dysphagia chair) Breathing: 0 Negative Vocalization: 0 Facial Expression: 0 Facial Expression: 0 End of Session: Start Time:SLP Start Time (ACUTE ONLY): 1414 Stop Time: SLP Stop Time (ACUTE ONLY): 1427 Time Calculation:SLP Time Calculation (min) (ACUTE ONLY): 13 min Charges: SLP Evaluations $ SLP Speech Visit: 1 Visit SLP Evaluations $MBS Swallow: 1 Procedure SLP visit diagnosis: SLP Visit Diagnosis: Dysphagia, unspecified (R13.10) Past Medical History: Past Medical History: Diagnosis Date  Arthritis   knee and shoulders  Colon polyps 09/05/2017  COPD (chronic obstructive pulmonary disease) (HCC)   Diabetes mellitus without complication (HCC)   type II  Dyspnea   05/16/20 has had a cough for 2.5 years post Covid  Gastritis 09/05/2017  GERD (gastroesophageal reflux disease)   Hyperlipidemia   Hypertension   patient denies  Hypothyroidism   Panic attack   RUQ pain 03/09/2017  Per patient, she has had RUQ pain 4-5 years that has grown worse in the last few months. Past Surgical History: Past Surgical History: Procedure Laterality Date  BREAST BIOPSY Left 2008  CORE W/CLIP - NEG  BRONCHIAL BIOPSY  05/19/2020  Procedure: BRONCHIAL BIOPSIES;  Surgeon: Leslye Peer, MD;  Location[facility] ENDOSCOPY;  Service: Pulmonary;;  BRONCHIAL BRUSHINGS  05/19/2020  Procedure: BRONCHIAL BRUSHINGS;  Surgeon: Leslye Peer, MD;  Location: [facility] ENDOSCOPY;  Service: Pulmonary;;  BRONCHIAL NEEDLE ASPIRATION BIOPSY  05/19/2020  Procedure: BRONCHIAL NEEDLE ASPIRATION BIOPSIES;  Surgeon: Leslye Peer, MD;  Location: [facility] ENDOSCOPY;  Service: Pulmonary;;  BRONCHIAL WASHINGS   05/19/2020  Procedure: BRONCHIAL WASHINGS;  Surgeon: Leslye Peer, MD;  Location: [facility] ENDOSCOPY;  Service: Pulmonary;;  COLONOSCOPY WITH PROPOFOL N/A 02/08/2017  Procedure: COLONOSCOPY WITH PROPOFOL;  Surgeon: Christena Deem, MD;  Location[facility] ENDOSCOPY;  Service: Endoscopy;  Laterality: N/A;  COLONOSCOPY WITH PROPOFOL N/A 05/05/2022  Procedure: COLONOSCOPY WITH PROPOFOL;  Surgeon: Toney Reil, MD;  Location: [facility] ENDOSCOPY;  Service: Gastroenterology;  Laterality: N/A;  ESOPHAGOGASTRODUODENOSCOPY (EGD) WITH PROPOFOL N/A 02/08/2017  Procedure: ESOPHAGOGASTRODUODENOSCOPY (EGD) WITH PROPOFOL;  Surgeon: Christena Deem, MD;  Location: [facility] ENDOSCOPY;  Service: Endoscopy;  Laterality: N/A;  TUBAL LIGATION    VIDEO BRONCHOSCOPY WITH ENDOBRONCHIAL NAVIGATION N/A 05/19/2020  Procedure:  VIDEO BRONCHOSCOPY WITH ENDOBRONCHIAL NAVIGATION;  Surgeon: Leslye Peer, MD;  Location: [facility] ENDOSCOPY;  Service: Pulmonary;  Laterality: N/A; [name] 09/27/2022, 3:06 PM  CT Angio Chest PE W and/or Wo Contrast  Result Date: 09/23/2022 CLINICAL DATA:  Pulmonary embolism (PE) suspected, high prob. Shortness of breath. Cough. Fever. EXAM: CT ANGIOGRAPHY CHEST WITH CONTRAST TECHNIQUE: Multidetector CT imaging of the chest was performed using the standard protocol during bolus administration of intravenous contrast. Multiplanar CT image reconstructions and MIPs were obtained to evaluate the vascular anatomy. RADIATION DOSE REDUCTION: This exam was performed according to the departmental dose-optimization program which includes automated exposure control, adjustment of the mA and/or kV according to patient size and/or use of iterative reconstruction technique. CONTRAST:  OMNIPAQUE IOHEXOL 350 MG/ML SOLN COMPARISON:  CT scan chest from 08/02/2022. FINDINGS: Cardiovascular: No evidence of embolism to the proximal subsegmental pulmonary artery level. Mild cardiomegaly. No pericardial effusion. No aortic aneurysm.  There are coronary artery calcifications, in keeping with coronary artery disease. There are also mild-to-moderate peripheral atherosclerotic vascular calcifications of thoracic aorta and its major branches. Mediastinum/Nodes: Visualized thyroid gland appears grossly unremarkable. No solid / cystic mediastinal masses. The esophagus is nondistended precluding optimal assessment. There are few mildly prominent mediastinal and hilar lymph nodes, which do not meet the size criteria for lymphadenopathy and though indeterminate most likely benign/reactive in etiology. No axillary lymphadenopathy by size criteria. Lungs/Pleura: The central tracheo-bronchial tree is patent. There are extensive heterogeneous opacities throughout bilateral lungs with asymmetric more involvement of left lung lower lobe, compatible with multilobar pneumonia. No pleural effusion or pneumothorax. Upper Abdomen: There is liver surface irregularity/nodularity, compatible with cirrhosis. There is a subcapsular 1.1 x 1.5 cm cyst in the left hepatic lobe, segment 2. There is a sinus cyst in the left kidney upper pole measuring 1.4 x 2.0 cm. Musculoskeletal: The visualized soft tissues of the chest wall are grossly unremarkable. No suspicious osseous lesions. There are mild to moderate multilevel degenerative changes in the visualized spine. Review of the MIP images confirms the above findings. IMPRESSION: 1. No evidence of pulmonary embolism. 2. Extensive heterogeneous opacities throughout bilateral lungs with asymmetric more involvement of the left lung lower lobe, compatible with multilobar pneumonia. 3. Multiple other nonacute observations, as described above. Aortic Atherosclerosis (ICD10-I70.0). Electronically Signed   By: Jules Schick M.D.   On: 09/23/2022 16:11   DG Chest 2 View  Result Date: 09/23/2022 CLINICAL DATA:  Suspected Sepsis.  Shortness of breath.  Cough. EXAM: CHEST - 2 VIEW COMPARISON:  09/14/2022. FINDINGS: Redemonstration of  increased interstitial markings throughout bilateral lungs. Heterogeneous opacity overlying the left lung lower lobe is essentially unchanged. However, there are new patchy opacities overlying the right lateral costophrenic angle which may represent superimposed pneumonitis versus atelectasis. Bilateral lateral costophrenic angles are clear. Stable cardio-mediastinal silhouette. No acute osseous abnormalities. The soft tissues are within normal limits. IMPRESSION: 1. Chronic interstitial lung disease. 2. New patchy opacities overlying the right lateral costophrenic angle may represent superimposed pneumonitis versus atelectasis. 3. Persistent left lung lower lobe opacities. Electronically Signed   By: Jules Schick M.D.   On: 09/23/2022 14:01   DG Chest 2 View  Result Date: 09/22/2022 CLINICAL DATA:  Short of breath. EXAM: CHEST - 2 VIEW COMPARISON:  03/01/2022.  CT, 08/02/2022. FINDINGS: Cardiac silhouette is normal in size and configuration. No mediastinal or hilar masses no convincing adenopathy. Focal masslike opacity in the left lower lobe, similar to the prior chest radiograph, corresponding to mass-like  consolidation noted on the CT from 08/02/2022. Remainder of the lungs show stable mild interstitial thickening, but is otherwise clear. No pleural effusion or pneumothorax. Skeletal structures are intact. IMPRESSION: 1. Persistent mass-like opacity in the left lower lobe. Based on the prior chest CT report, has been previous bronchoscopic biopsy. PET imaging showed no significant hypermetabolism. Malignancy unlikely given the finding stability and prior negative PET-CT/biopsy. However, recommend continued surveillance with repeat chest radiograph 6-12 months. 2. No new abnormalities. Electronically Signed   By: Amie Portland M.D.   On: 09/22/2022 14:18     TODAY-DAY OF DISCHARGE:  Subjective:   Mikisha Dacruz today has no headache,no chest abdominal pain,no new weakness tingling or numbness, feels  much better wants to go home today.   Objective:   Blood pressure (!) 107/59, pulse 75, temperature 97.9 F (36.6 C), temperature source Axillary, resp. rate 18, height 5\' 6"  (1.676 m), weight 104.8 kg, SpO2 (!) 89%.  Intake/Output Summary (Last 24 hours) at 09/30/2022 0908 Last data filed at 09/29/2022 2200 Gross per 24 hour  Intake 720 ml  Output --  Net 720 ml   Filed Weights   09/23/22 1156  Weight: 104.8 kg    Exam: Awake Alert, Oriented *3, No new F.N deficits, Normal affect [place].AT,PERRAL Supple Neck,No JVD, No cervical lymphadenopathy appriciated.  Symmetrical Chest wall movement, Good air movement bilaterally, CTAB RRR,No Gallops,Rubs or new Murmurs, No Parasternal Heave +ve B.Sounds, Abd Soft, Non tender, No organomegaly appriciated, No rebound -guarding or rigidity. No Cyanosis, Clubbing or edema, No new Rash or bruise   PERTINENT RADIOLOGIC STUDIES: DG Chest Port 1 View  Result Date: 09/28/2022 CLINICAL DATA:  Shortness of breath, COVID positive EXAM: PORTABLE CHEST - 1 VIEW COMPARISON:  09/23/2022 FINDINGS: Persistent bibasilar interstitial and airspace opacities left greater than right. Worsening right upper lobe opacities. Heart size and mediastinal contours are within normal limits. Aortic Atherosclerosis (ICD10-170.0). No effusion. Visualized bones unremarkable. IMPRESSION: Persistent bibasilar and worsening right upper lobe opacities. Electronically Signed   By: Corlis Leak M.D.   On: 09/28/2022 14:18     PERTINENT LAB RESULTS: CBC: Recent Labs    09/28/22 0413 09/29/22 0621  WBC 4.9 5.2  HGB 8.9* 8.7*  HCT 27.4* 27.1*  PLT 168 185   CMET CMP     Component Value Date/Time   NA 132 (L) 09/29/2022 0621   NA 140 05/27/2022 1151   K 3.8 09/29/2022 0621   CL 97 (L) 09/29/2022 0621   CO2 25 09/29/2022 0621   GLUCOSE 179 (H) 09/29/2022 0621   BUN 32 (H) 09/29/2022 0621   BUN 17 05/27/2022 1151   CREATININE 1.13 (H) 09/29/2022 0621   CREATININE 1.03 (H)  10/21/2021 1431   CALCIUM 9.2 09/29/2022 0621   PROT 6.2 (L) 09/26/2022 0719   PROT 6.4 03/15/2022 1351   ALBUMIN 2.9 (L) 09/26/2022 0719   ALBUMIN 4.3 03/15/2022 1351   AST 26 09/26/2022 0719   ALT 23 09/26/2022 0719   ALKPHOS 40 09/26/2022 0719   BILITOT 0.5 09/26/2022 0719   BILITOT 0.3 03/15/2022 1351   GFR 40.88 (L) 09/14/2022 1519   EGFR 50 (L) 05/27/2022 1151   GFRNONAA 51 (L) 09/29/2022 0621   GFRNONAA 63 11/15/2019 1523    GFR Estimated Creatinine Clearance: 52.6 mL/min (A) (by C-G formula based on SCr of 1.13 mg/dL (H)). No results for input(s): "LIPASE", "AMYLASE" in the last 72 hours. No results for input(s): "CKTOTAL", "CKMB", "CKMBINDEX", "TROPONINI" in the last 72 hours. Invalid  input(s): "POCBNP" No results for input(s): "DDIMER" in the last 72 hours. No results for input(s): "HGBA1C" in the last 72 hours. No results for input(s): "CHOL", "HDL", "LDLCALC", "TRIG", "CHOLHDL", "LDLDIRECT" in the last 72 hours. No results for input(s): "TSH", "T4TOTAL", "T3FREE", "THYROIDAB" in the last 72 hours.  Invalid input(s): "FREET3" No results for input(s): "VITAMINB12", "FOLATE", "FERRITIN", "TIBC", "IRON", "RETICCTPCT" in the last 72 hours. Coags: No results for input(s): "INR" in the last 72 hours.  Invalid input(s): "PT" Microbiology: Recent Results (from the past 240 hour(s))  Culture, blood (Routine x 2)     Status: None   Collection Time: 09/23/22 11:59 AM   Specimen: BLOOD LEFT ARM  Result Value Ref Range Status   Specimen Description BLOOD LEFT ARM  Final   Special Requests   Final    BOTTLES DRAWN AEROBIC AND ANAEROBIC Blood Culture adequate volume   Culture   Final    NO GROWTH 5 DAYS Performed at [facility], [address], [place], [place] [address]    Report Status 09/28/2022 FINAL  Final  Resp panel by RT-PCR (RSV, Flu A&B, Covid) Anterior Nasal Swab     Status: Abnormal   Collection Time: 09/23/22 11:59 AM   Specimen: Anterior Nasal Swab   Result Value Ref Range Status   SARS Coronavirus 2 by RT PCR POSITIVE (A) NEGATIVE Final   Influenza A by PCR NEGATIVE NEGATIVE Final   Influenza B by PCR NEGATIVE NEGATIVE Final    Comment: (NOTE) The Xpert Xpress SARS-CoV-2/FLU/RSV plus assay is intended as an aid in the diagnosis of influenza from Nasopharyngeal swab specimens and should not be used as a sole basis for treatment. Nasal washings and aspirates are unacceptable for Xpert Xpress SARS-CoV-2/FLU/RSV testing.  Fact Sheet for Patients: BloggerCourse.com  Fact Sheet for Healthcare Providers: SeriousBroker.it  This test is not yet approved or cleared by the [place] FDA and has been authorized for detection and/or diagnosis of SARS-CoV-2 by FDA under an Emergency Use Authorization (EUA). This EUA will remain in effect (meaning this test can be used) for the duration of the COVID-19 declaration under Section 564(b)(1) of the Act, 21 U.S.C. section 360bbb-3(b)(1), unless the authorization is terminated or revoked.     Resp Syncytial Virus by PCR NEGATIVE NEGATIVE Final    Comment: (NOTE) Fact Sheet for Patients: BloggerCourse.com  Fact Sheet for Healthcare Providers: SeriousBroker.it  This test is not yet approved or cleared by the [place] FDA and has been authorized for detection and/or diagnosis of SARS-CoV-2 by FDA under an Emergency Use Authorization (EUA). This EUA will remain in effect (meaning this test can be used) for the duration of the COVID-19 declaration under Section 564(b)(1) of the Act, 21 U.S.C. section 360bbb-3(b)(1), unless the authorization is terminated or revoked.  Performed at [facility], [address] [place], [place] [address]   Culture, blood (Routine x 2)     Status: None   Collection Time: 09/23/22  1:30 PM   Specimen: BLOOD RIGHT ARM  Result Value Ref Range Status    Specimen Description BLOOD RIGHT ARM  Final   Special Requests   Final    BOTTLES DRAWN AEROBIC AND ANAEROBIC Blood Culture adequate volume   Culture   Final    NO GROWTH 5 DAYS Performed at [facility], [address] [place], [place] [address]    Report Status 09/28/2022 FINAL  Final    FURTHER DISCHARGE INSTRUCTIONS:  Get Medicines reviewed  and adjusted: Please take all your medications with you for your next visit with your Primary MD  Laboratory/radiological data: Please request your Primary MD to go over all hospital tests and procedure/radiological results at the follow up, please ask your Primary MD to get all Hospital records sent to his/her office.  In some cases, they will be blood work, cultures and biopsy results pending at the time of your discharge. Please request that your primary care M.D. goes through all the records of your hospital data and follows up on these results.  Also Note the following: If you experience worsening of your admission symptoms, develop shortness of breath, life threatening emergency, suicidal or homicidal thoughts you must seek medical attention immediately by calling 911 or calling your MD immediately  if symptoms less severe.  You must read complete instructions/literature along with all the possible adverse reactions/side effects for all the Medicines you take and that have been prescribed to you. Take any new Medicines after you have completely understood and accpet all the possible adverse reactions/side effects.   Do not drive when taking Pain medications or sleeping medications (Benzodaizepines)  Do not take more than prescribed Pain, Sleep and Anxiety Medications. It is not advisable to combine anxiety,sleep and pain medications without talking with your primary care practitioner  Special Instructions: If you have smoked or chewed Tobacco  in the last 2 yrs please stop smoking, stop any regular Alcohol  and or any Recreational  drug use.  Wear Seat belts while driving.  Please note: You were cared for by a hospitalist during your hospital stay. Once you are discharged, your primary care physician will handle any further medical issues. Please note that NO REFILLS for any discharge medications will be authorized once you are discharged, as it is imperative that you return to your primary care physician (or establish a relationship with a primary care physician if you do not have one) for your post hospital discharge needs so that they can reassess your need for medications and monitor your lab values.  Total Time spent coordinating discharge including counseling, education and face to face time equals greater than 30 minutes.  SignedJeoffrey Massed 09/30/2022 9:08 AM

## 2022-10-04 ENCOUNTER — Telehealth: Payer: Self-pay | Admitting: *Deleted

## 2022-10-04 ENCOUNTER — Ambulatory Visit (HOSPITAL_COMMUNITY): Payer: Medicare HMO

## 2022-10-04 NOTE — Transitions of Care (Post Inpatient/ED Visit) (Signed)
10/04/2022  Name: Tamara [name] MRN: [id number] DOB: 08/09/1945  Today's TOC FU Call Status: Today's TOC FU Call Status:: Unsuccessful Call (1st Attempt) Unsuccessful Call (1st Attempt) Date: 10/04/22  Attempted to reach the patient regarding the most recent Inpatient/ED visit.  Follow Up Plan: Additional outreach attempts will be made to reach the patient to complete the Transitions of Care (Post Inpatient/ED visit) call.   [name] BSN RN [facility] [phone]

## 2022-10-04 NOTE — Transitions of Care (Post Inpatient/ED Visit) (Signed)
10/04/2022  Name: Tamara [name] MRN: [id number] DOB: May 25, 1945  Today's TOC FU Call Status: Today's TOC FU Call Status:: Successful TOC FU Call Completed TOC FU Call Complete Date: 10/04/22 Patient's Name and Date of Birth confirmed.  Transition Care Management Follow-up Telephone Call Date of Discharge: 09/30/22 Discharge Facility: [facility]) Type of Discharge: Inpatient Admission Primary Inpatient Discharge Diagnosis:: Covid How have you been since you were released from the hospital?: Better Any questions or concerns?: Yes Patient Questions/Concerns:: When RN went over appt. Patient didn't know she was to have a PET scan today. Patient will call and reschedule Patient Questions/Concerns Addressed: Other:  Items Reviewed: Did you receive and understand the discharge instructions provided?: Yes Medications obtained,verified, and reconciled?: Yes (Medications Reviewed) Any new allergies since your discharge?: No Dietary orders reviewed?: No Do you have support at home?: Yes People in Home: spouse Name of Support/Comfort Primary Source: [name]  Medications Reviewed Today: Medications Reviewed Today     Reviewed by Luella Cook, RN (Case Manager) on 10/04/22 at [phone]  Med List Status: <None>   Medication Order Taking? Sig Documenting Provider Last Dose Status Informant  Accu-Chek Softclix Lancets lancets [id number] Yes TEST BLOOD SUGAR TWO TIMES DAILY Margarita Mail, DO Taking Active Self, Pharmacy Records  albuterol (PROVENTIL) (2.5 MG/3ML) 0.083% nebulizer solution [id number] Yes Take 3 mLs (2.5 mg total) by nebulization every 6 (six) hours as needed for wheezing or shortness of breath. Margarita Mail, DO Taking Active Self, Pharmacy Records  albuterol (VENTOLIN HFA) 108 (90 Base) MCG/ACT inhaler [id number] Yes Inhale 2 puffs into the lungs every 6 (six) hours as needed for wheezing or shortness of breath. Leslye Peer, MD Taking Active Self, Pharmacy  Records  Alcohol Swabs (DROPSAFE ALCOHOL PREP) 70 % PADS [id number] Yes USE AS DIRECTED TWICE DAILY WHEN TESTING BLOOD SUGAR Carlus Pavlov, MD Taking Active Self, Pharmacy Records  Ascorbic Acid (VITAMIN C) 1000 MG tablet [id number] Yes Take 1,000 mg by mouth daily. [provider] Taking Active Self, Pharmacy Records  aspirin EC 81 MG tablet [id number] Yes Take 1 tablet (81 mg total) by mouth daily. Swallow whole. Meriam Sprague, MD Taking Active Self, Pharmacy Records  Cholecalciferol (VITAMIN D) 50 MCG (2000 UT) CAPS [id number] Yes Take 1,000 Units by mouth daily. [provider] Taking Active Self, Pharmacy Records  dapagliflozin propanediol (FARXIGA) 5 MG TABS tablet [id number] Yes Take 1 tablet (5 mg total) by mouth daily before breakfast. Carlus Pavlov, MD Taking Active Self, Pharmacy Records  dexamethasone (DECADRON) 6 MG tablet [id number] Yes Take 1 tablet (6 mg total) by mouth daily for 3 doses. Ghimire, Werner Lean, MD Taking Active   DROPLET PEN NEEDLES 32G X 4 MM MISC [id number] Yes 1 Device by Other route daily. Carlus Pavlov, MD Taking Active Self, Pharmacy Records  ezetimibe (ZETIA) 10 MG tablet [id number] Yes Take 1 tablet (10 mg total) by mouth daily. Flossie Dibble, NP Taking Active Self, Pharmacy Records  furosemide (LASIX) 40 MG tablet [id number] Yes Take 1 tablet (40 mg total) by mouth daily. Maretta Bees, MD Taking Active   gabapentin (NEURONTIN) 300 MG capsule [id number] Yes Take 300 mg by mouth as needed. [provider] Taking Active Self, Pharmacy Records  Insulin NPH, Human,, Isophane, (NOVOLIN N FLEXPEN) 100 UNIT/ML Ulice Brilliant [id number] Yes 36 units in am and 28-30 units at bedtime Carlus Pavlov, MD Taking Active Self, Pharmacy Records  levothyroxine (SYNTHROID) 50 MCG tablet [id number] Yes TAKE 1 TABLET EVERY DAY BEFORE BREAKFAST  Margarita Mail, DO Taking Active Self, Pharmacy Records  meloxicam [facility]) 15 MG tablet [id number]  Yes TAKE 1/2 TO 1 TABLET EVERY DAY AS NEEDED FOR PAIN Margarita Mail, DO Taking Active Self, Pharmacy Records  metFORMIN (GLUCOPHAGE) 1000 MG tablet [id number] Yes Take 1 tablet (1,000 mg total) by mouth 2 (two) times daily. Carlus Pavlov, MD Taking Active Self, Pharmacy Records  nystatin (MYCOSTATIN/NYSTOP) powder [id number] Yes Apply 1 Application topically daily as needed (yeast under belly). Margarita Mail, DO Taking Active Self, Pharmacy Records  pantoprazole (PROTONIX) 40 MG tablet [id number] Yes TAKE 1 TABLET TWICE DAILY Byrum, Les Pou, MD Taking Active Self, Pharmacy Records  potassium chloride (KLOR-CON M) 10 MEQ tablet [id number] Yes Take 1 tablet (10 mEq total) by mouth daily. Meriam Sprague, MD Taking Active Self, Pharmacy Records  rosuvastatin (CRESTOR) 20 MG tablet [id number] Yes TAKE 1 TABLET AT BEDTIME Margarita Mail, DO Taking Active Self, Pharmacy Records  Tiotropium Bromide-Olodaterol (STIOLTO RESPIMAT) 2.5-2.5 MCG/ACT AERS [id number] Yes Inhale 2 puffs into the lungs daily. Leslye Peer, MD Taking Active Self, Pharmacy Records  TRUE [facility] BLOOD GLUCOSE TEST test strip [id number] Yes Use 2x a day Carlus Pavlov, MD Taking Active Self, Pharmacy Records  valsartan (DIOVAN) 80 MG tablet [id number] Yes TAKE 1 TABLET (80 MG TOTAL) BY MOUTH DAILY. Berniece Salines, FNP Taking Active Self, Pharmacy Records            Home Care and Equipment/Supplies: Were Home Health Services Ordered?: NA Any new equipment or medical supplies ordered?: NA  Functional Questionnaire: Do you need assistance with bathing/showering or dressing?: No Do you need assistance with meal preparation?: No Do you need assistance with eating?: No Do you have difficulty maintaining continence: No Do you need assistance with getting out of bed/getting out of a chair/moving?: No Do you have difficulty managing or taking your medications?: No  Follow up appointments reviewed: PCP Follow-up  appointment confirmed?: No MD Provider Line Number:[phone] Given: Yes (Patient stated she will call herself and schedule) Specialist Hospital Follow-up appointment confirmed?: Yes Date of Specialist follow-up appointment?: 10/13/22 Follow-Up Specialty Provider:: Dr Levy Pupa, [id number] Dr Lafe Garin 1:40 Do you need transportation to your follow-up appointment?: No Do you understand care options if your condition(s) worsen?: Yes-patient verbalized understanding  SDOH Interventions Today    Flowsheet Row Most Recent Value  SDOH Interventions   Food Insecurity Interventions Intervention Not Indicated  Housing Interventions Intervention Not Indicated  Transportation Interventions Intervention Not Indicated, Patient Resources (Friends/Family)      Interventions Today    Flowsheet Row Most Recent Value  General Interventions   General Interventions Discussed/Reviewed General Interventions Discussed, General Interventions Reviewed, Doctor Visits, Community Resources  [Patient refused care coordination]  Doctor Visits Discussed/Reviewed Doctor Visits Discussed, Doctor Visits Reviewed  [patient did not realize the PET scan was for today and had not prepared. She will call and reschedule]  Pharmacy Interventions   Pharmacy Dicussed/Reviewed Pharmacy Topics Discussed  [Patient took her last dose of decadron today]       TOC Interventions Today    Flowsheet Row Most Recent Value  TOC Interventions   TOC Interventions Discussed/Reviewed TOC Interventions Discussed, TOC Interventions Reviewed        [name] BSN RN [facility] [phone]

## 2022-10-06 ENCOUNTER — Other Ambulatory Visit: Payer: Self-pay | Admitting: Emergency Medicine

## 2022-10-11 ENCOUNTER — Ambulatory Visit: Payer: Medicare HMO | Admitting: Internal Medicine

## 2022-10-11 ENCOUNTER — Ambulatory Visit (HOSPITAL_COMMUNITY)
Admission: RE | Admit: 2022-10-11 | Discharge: 2022-10-11 | Disposition: A | Discharge status: Home / Self Care | Payer: Medicare HMO | Source: Ambulatory Visit | Attending: Emergency Medicine | Admitting: Emergency Medicine

## 2022-10-11 DIAGNOSIS — R918 Other nonspecific abnormal finding of lung field: Secondary | ICD-10-CM | POA: Insufficient documentation

## 2022-10-11 LAB — GLUCOSE, CAPILLARY: Glucose-Capillary: 155 mg/dL — ABNORMAL HIGH (ref 70–99)

## 2022-10-11 MED ORDER — FLUDEOXYGLUCOSE F - 18 (FDG) INJECTION
11.5200 | Freq: Once | INTRAVENOUS | Status: AC
  Administered 2022-10-11: 11.52 via INTRAVENOUS

## 2022-10-11 NOTE — Progress Notes (Deleted)
Patient ID: Tamara [name], female   DOB: 1945/08/05, 77 y.o.   MRN: [id number]  HPI: Tamara [name] is a 77 y.o.-year-old female, returning for follow-up for DM2, dx in 2006, insulin-dependent, uncontrolled, with complications (atherosclerosis) and hypothyroidism Pt. previously saw Dr. Everardo All, but last visit with me 4 months ago. She is the mother of Tamara [name], also my pt.  She is here with her husband.  Interim hx: No increased urination, blurry vision, nausea. Since last visit she was found to have a cavitating mass in the left lower lung. She was admitted with COVID 09/23/2022.  Glucose was 511 at that time.  DM2: Reviewed HbA1c: Lab Results  Component Value Date   HGBA1C 8.8 (H) 09/23/2022   HGBA1C 7.7 (A) 06/10/2022   HGBA1C 7.7 (A) 11/16/2021   HGBA1C 10.1 (A) 08/11/2021   HGBA1C 8.7 (A) 03/26/2021   HGBA1C 7.3 (A) 11/26/2020   HGBA1C 7.4 (A) 07/18/2020   HGBA1C 7.5 (A) 03/19/2020   HGBA1C 8.0 (A) 12/19/2019   HGBA1C 7.4 (A) 09/17/2019   Pt is on a regimen of: - Metformin 1000 mg 2x a day  - NPH  40 >> 36 units in am and 24 >> 28-30 units at bedtime She tried metformin in the past but this caused arthralgias, however, she restarted as her CBGs were in the 400 She was previously on insulin in 2017-2018, but came off after losing approximately 40 pounds. Per Dr. George Hugh note, she previously declined multiple daily insulin injections. We tried [place] 07/2021 >> $$$.  Pt checks her sugars 2x a day and they are: - am: 200-300, 400 >> 97-120s >> 130-169, 183, 187 >> 121-143 - 2h after b'fast: n/c - before lunch: 129 >> 111, 133, 137, 151, 157 >> n/c >> 120-167 - 2h after lunch: n/c  >> 146- 177 >> 131, 137, 162 - before dinner: n/c >> 101, 104 >> n/c - 2h after dinner: n/c - bedtime: 164 >> n/c - nighttime: n/c Lowest sugar was 97 >> 101 >> 120; she has hypoglycemia awareness at 70.  Highest sugar was 400 >> 187 >> 167.  Glucometer: True Metrix  - no  CKD, last BUN/creatinine:  Lab Results  Component Value Date   BUN 32 (H) 09/29/2022   BUN 31 (H) 09/28/2022   CREATININE 1.13 (H) 09/29/2022   CREATININE 1.05 (H) 09/28/2022   Lab Results  Component Value Date   MICRALBCREAT 7 10/21/2021  She is not on ACE inhibitor/ARB.  -+ HL; last set of lipids: Lab Results  Component Value Date   CHOL 140 03/15/2022   HDL 50 03/15/2022   LDLCALC 65 03/15/2022   LDLDIRECT 64 03/15/2022   TRIG 148 03/15/2022   CHOLHDL 2.8 03/15/2022  On Crestor 20 mg daily.  - last eye exam was in 09/2021. No DR reportingly. + cataracts.  - no numbness and tingling in her feet.  Last foot exam 06/10/2022.  Reviewed latest imaging: CT chest (08/06/2021): Atherosclerosis of aorta, great vessels, and coronary arteries.  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  Of note, this mass was not FDG avid on PET scan.  Hypothyroidism:  She is on levothyroxine 50 mcg daily: - in am - fasting - at least 30 min from b'fast - no calcium - no iron - no multivitamins - + PPIs - Protonix 30 min later! >> moved >4h later - not on Biotin  Reviewed her latest TSH levels: Lab Results  Component Value Date  TSH 3.34 08/26/2021   TSH 3.88 07/28/2020   TSH 2.24 11/15/2019   TSH 2.45 03/22/2019   TSH 4.62 (H) 11/22/2018   TSH 3.92 05/26/2017   She also has a history of HTN, GERD.  ROS: + see HPI  Past Medical History:  Diagnosis Date   Arthritis    knee and shoulders   Colon polyps 09/05/2017   COPD (chronic obstructive pulmonary disease) (HCC)    Diabetes mellitus without complication (HCC)    type II   Dyspnea    05/16/20 has had a cough for 2.5 years post Covid   Gastritis 09/05/2017   GERD (gastroesophageal reflux disease)    Hyperlipidemia    Hypertension    patient denies   Hypothyroidism    Panic attack    RUQ pain 03/09/2017   Per patient, she has had RUQ pain 4-5 years that has grown worse in the last few months.    Past Surgical History:  Procedure Laterality Date   BREAST BIOPSY Left 2008   CORE W/CLIP - NEG   BRONCHIAL BIOPSY  05/19/2020   Procedure: BRONCHIAL BIOPSIES;  Surgeon: Leslye Peer, MD;  Location: [facility] ENDOSCOPY;  Service: Pulmonary;;   BRONCHIAL BRUSHINGS  05/19/2020   Procedure: BRONCHIAL BRUSHINGS;  Surgeon: Leslye Peer, MD;  Location: [facility] ENDOSCOPY;  Service: Pulmonary;;   BRONCHIAL NEEDLE ASPIRATION BIOPSY  05/19/2020   Procedure: BRONCHIAL NEEDLE ASPIRATION BIOPSIES;  Surgeon: Leslye Peer, MD;  Location: [facility] ENDOSCOPY;  Service: Pulmonary;;   BRONCHIAL WASHINGS  05/19/2020   Procedure: BRONCHIAL WASHINGS;  Surgeon: Leslye Peer, MD;  Location: [facility] ENDOSCOPY;  Service: Pulmonary;;   COLONOSCOPY WITH PROPOFOL N/A 02/08/2017   Procedure: COLONOSCOPY WITH PROPOFOL;  Surgeon: Christena Deem, MD;  Location: [facility] ENDOSCOPY;  Service: Endoscopy;  Laterality: N/A;   COLONOSCOPY WITH PROPOFOL N/A 05/05/2022   Procedure: COLONOSCOPY WITH PROPOFOL;  Surgeon: Toney Reil, MD;  Location: [facility] ENDOSCOPY;  Service: Gastroenterology;  Laterality: N/A;   ESOPHAGOGASTRODUODENOSCOPY (EGD) WITH PROPOFOL N/A 02/08/2017   Procedure: ESOPHAGOGASTRODUODENOSCOPY (EGD) WITH PROPOFOL;  Surgeon: Christena Deem, MD;  Location: [facility] ENDOSCOPY;  Service: Endoscopy;  Laterality: N/A;   TUBAL LIGATION     VIDEO BRONCHOSCOPY WITH ENDOBRONCHIAL NAVIGATION N/A 05/19/2020   Procedure: VIDEO BRONCHOSCOPY WITH ENDOBRONCHIAL NAVIGATION;  Surgeon: Leslye Peer, MD;  Location: [facility] ENDOSCOPY;  Service: Pulmonary;  Laterality: N/A;   Social History   Socioeconomic History   Marital status: Married    Spouse name: Tamara [name]   Number of children: 3   Years of education: Not on file   Highest education level: High school graduate  Occupational History   Occupation: retired  Tobacco Use   Smoking status: Former    Current packs/day: 0.00    Average packs/day: 1 pack/day for 40.0 years (40.0 ttl  pk-yrs)    Types: Cigarettes    Start date: 1966    Quit date: 2006    Years since quitting: 18.7   Smokeless tobacco: Never   Tobacco comments:    Smoking cessation materials not required  Vaping Use   Vaping status: Never Used  Substance and Sexual Activity   Alcohol use: No   Drug use: No   Sexual activity: Not on file  Other Topics Concern   Not on file  Social History Narrative   Not on file   Social Determinants of Health   Financial Resource Strain: Medium Risk (10/20/2021)   Overall Financial Resource Strain (CARDIA)  Difficulty of Paying Living Expenses: Somewhat hard  Food Insecurity: No Food Insecurity (10/04/2022)   Hunger Vital Sign    Worried About Running Out of Food in the Last Year: Never true    Ran Out of Food in the Last Year: Never true  Transportation Needs: No Transportation Needs (10/04/2022)   PRAPARE - Administrator, Civil Service (Medical): No    Lack of Transportation (Non-Medical): No  Physical Activity: Inactive (10/20/2021)   Exercise Vital Sign    Days of Exercise per Week: 0 days    Minutes of Exercise per Session: 0 min  Stress: Stress Concern Present (10/20/2021)   [employer] of Occupational Health - Occupational Stress Questionnaire    Feeling of Stress : To some extent  Social Connections: Moderately Integrated (10/20/2021)   Social Connection and Isolation Panel [NHANES]    Frequency of Communication with Friends and Family: More than three times a week    Frequency of Social Gatherings with Friends and Family: More than three times a week    Attends Religious Services: 1 to 4 times per year    Active Member of [employer] or Organizations: No    Attends Banker Meetings: Never    Marital Status: Married  Catering manager Violence: Not At Risk (09/23/2022)   Humiliation, Afraid, Rape, and Kick questionnaire    Fear of Current or Ex-Partner: No    Emotionally Abused: No    Physically Abused: No    Sexually  Abused: No   Current Outpatient Medications on File Prior to Visit  Medication Sig Dispense Refill   Accu-Chek Softclix Lancets lancets TEST BLOOD SUGAR TWO TIMES DAILY 200 each 3   albuterol (PROVENTIL) (2.5 MG/3ML) 0.083% nebulizer solution Take 3 mLs (2.5 mg total) by nebulization every 6 (six) hours as needed for wheezing or shortness of breath. 75 mL 5   albuterol (VENTOLIN HFA) 108 (90 Base) MCG/ACT inhaler Inhale 2 puffs into the lungs every 6 (six) hours as needed for wheezing or shortness of breath. 8 g 6   Alcohol Swabs (DROPSAFE ALCOHOL PREP) 70 % PADS USE AS DIRECTED TWICE DAILY WHEN TESTING BLOOD SUGAR 200 each 3   Ascorbic Acid (VITAMIN C) 1000 MG tablet Take 1,000 mg by mouth daily.     aspirin EC 81 MG tablet Take 1 tablet (81 mg total) by mouth daily. Swallow whole. 90 tablet 3   Cholecalciferol (VITAMIN D) 50 MCG (2000 UT) CAPS Take 1,000 Units by mouth daily.     dapagliflozin propanediol (FARXIGA) 5 MG TABS tablet Take 1 tablet (5 mg total) by mouth daily before breakfast. 90 tablet 3   DROPLET PEN NEEDLES 32G X 4 MM MISC 1 Device by Other route daily. 200 each 3   ezetimibe (ZETIA) 10 MG tablet Take 1 tablet (10 mg total) by mouth daily. 90 tablet 3   furosemide (LASIX) 40 MG tablet Take 1 tablet (40 mg total) by mouth daily. 90 tablet 1   gabapentin (NEURONTIN) 300 MG capsule Take 300 mg by mouth as needed.     Insulin NPH, Human,, Isophane, (NOVOLIN N FLEXPEN) 100 UNIT/ML Kiwkpen 36 units in am and 28-30 units at bedtime 60 mL 1   levothyroxine (SYNTHROID) 50 MCG tablet TAKE 1 TABLET EVERY DAY BEFORE BREAKFAST 90 tablet 0   meloxicam (MOBIC) 15 MG tablet TAKE 1/2 TO 1 TABLET EVERY DAY AS NEEDED FOR PAIN 90 tablet 0   metFORMIN (GLUCOPHAGE) 1000 MG tablet Take 1 tablet (1,000  mg total) by mouth 2 (two) times daily. 180 tablet 3   nystatin (MYCOSTATIN/NYSTOP) powder Apply 1 Application topically daily as needed (yeast under belly). 60 g 2   pantoprazole (PROTONIX) 40 MG  tablet TAKE 1 TABLET TWICE DAILY 180 tablet 3   potassium chloride (KLOR-CON M) 10 MEQ tablet Take 1 tablet (10 mEq total) by mouth daily. 90 tablet 2   rosuvastatin (CRESTOR) 20 MG tablet TAKE 1 TABLET AT BEDTIME 90 tablet 0   Tiotropium Bromide-Olodaterol (STIOLTO RESPIMAT) 2.5-2.5 MCG/ACT AERS Inhale 2 puffs into the lungs daily. 4 g 6   TRUE METRIX BLOOD GLUCOSE TEST test strip Use 2x a day 200 each 3   valsartan (DIOVAN) 80 MG tablet TAKE 1 TABLET (80 MG TOTAL) BY MOUTH DAILY. 90 tablet 3   No current facility-administered medications on file prior to visit.   Allergies  Allergen Reactions   Penicillins Hives    Tolerated rocephin 09/2022 Reaction: 1964   Family History  Problem Relation Age of Onset   Diabetes Maternal Grandmother    Breast cancer Neg Hx    PE: There were no vitals taken for this visit. Wt Readings from Last 3 Encounters:  09/23/22 231 lb (104.8 kg)  09/23/22 231 lb (104.8 kg)  09/14/22 234 lb 6.4 oz (106.3 kg)   Constitutional: overweight, in NAD Eyes: no exophthalmos ENT:  no thyromegaly, no cervical lymphadenopathy Cardiovascular: RRR, No MRG, + B LE edema Respiratory: CTA B lungs Musculoskeletal: no deformities Skin:  no rashes Neurological: no tremor with outstretched hands  ASSESSMENT: 1. DM2, insulin-dependent, uncontrolled, without long-term complications, but with hyperglycemia  2.  Hypothyroidism  3. Hyperlipidemia  PLAN:  1. Patient with longstanding, uncontrolled type 2 diabetes, on intermediate acting insulin and metformin.  HbA1c improved from 10.1% to 7.7% at last visit.  However, she had another HbA1c obtained earlier this month and this was higher, at 8.8%.  At last visit, sugars were above target in the morning and before lunch and at target after lunch.  She was not checking sugars in the evening and I advised her to try to start to do so.  Also, she had relaxed her diet since the previous visit and we discussed about resuming a low  calorie density diet.  She tried the low-fat low-carb diet in the past and she was able to lose almost 40 pounds and dropped her HbA1c under 6%.  We did not change the regimen at last visit.  - I suggested to:  Patient Instructions  Please continue: - Metformin 1000 mg 2x a day - NPH 36 units in am and 28-30 units at bedtime  Check some sugars before and some after dinner and at bedtime.  Try to go back to a low calorie density diet.  Please return in 4 months with your sugar log.   - we checked her HbA1c: 7.7% (stable) - advised to check sugars at different times of the day - 2x a day, rotating check times - advised for yearly eye exams >> she is UTD - return to clinic in 3-4 months  2. Hypothyroidism: - latest thyroid labs reviewed with pt. >> normal: Lab Results  Component Value Date   TSH 3.34 08/26/2021  - she continues on LT4 50 mcg daily - pt feels good on this dose. - we discussed about taking the thyroid hormone every day, with water, >30 minutes before breakfast, separated by >4 hours from acid reflux medications, calcium, iron, multivitamins. Pt. is taking it correctly. -  will check thyroid tests today: TSH and fT4 - If labs are abnormal, she will need to return for repeat TFTs in 1.5 months  3. HL -Reviewed lipid panel from 02/2022: Fractions at goal: Lab Results  Component Value Date   CHOL 140 03/15/2022   HDL 50 03/15/2022   LDLCALC 65 03/15/2022   LDLDIRECT 64 03/15/2022   TRIG 148 03/15/2022   CHOLHDL 2.8 03/15/2022  -She continues Crestor 20 mg daily and Zetia 10 mg daily without side effects  Carlus Pavlov, MD PhD [facility] Endocrinology

## 2022-10-12 ENCOUNTER — Ambulatory Visit: Payer: Medicare HMO | Admitting: Primary Care

## 2022-10-13 ENCOUNTER — Ambulatory Visit: Payer: Medicare HMO | Admitting: Emergency Medicine

## 2022-10-13 ENCOUNTER — Encounter: Payer: Self-pay | Admitting: Emergency Medicine

## 2022-10-13 VITALS — BP 118/58 | HR 98 | Ht 66.0 in | Wt 228.8 lb

## 2022-10-13 DIAGNOSIS — J441 Chronic obstructive pulmonary disease with (acute) exacerbation: Secondary | ICD-10-CM

## 2022-10-13 DIAGNOSIS — J9611 Chronic respiratory failure with hypoxia: Secondary | ICD-10-CM | POA: Diagnosis not present

## 2022-10-13 DIAGNOSIS — J984 Other disorders of lung: Secondary | ICD-10-CM

## 2022-10-13 DIAGNOSIS — R0989 Other specified symptoms and signs involving the circulatory and respiratory systems: Secondary | ICD-10-CM

## 2022-10-13 DIAGNOSIS — R059 Cough, unspecified: Secondary | ICD-10-CM

## 2022-10-13 DIAGNOSIS — R042 Hemoptysis: Secondary | ICD-10-CM

## 2022-10-13 NOTE — Progress Notes (Signed)
Subjective:    Patient ID: CASIA PLA, female    DOB: [birth year], 77 y.o.   MRN: [id number]  HPI  ROV 09/08/2022 --Ms. Boggs has a history of COPD, chronic cough, left lower lobe rounded atelectasis that was evaluated by bronchoscopy and PET scan (both reassuring).  We repeated her CT scan of the chest 08/02/2022 as below.  I tried changing her Stiolto to [place] last time - she has had increased cough, some chest discomfort since we made that change. She has yellow mucous, no blood. No pred or abx since I last saw her. No fevers, chills, sweats. She has cough w eating. Denies any overt aspiration sx.   CT scan of the chest 08/02/2022 reviewed by me, shows overall similar appearance of masslike consolidation in the posterior left lower lobe 4.7 x 6 cm.  There is a new masslike consolidation and groundglass in the superior segment of the left lower lobe approximately 4.5 x 5.4 cm  ROV 10/13/2022 --77 year old woman whom I have followed for her COPD, chronic cough and an area of left lower lobe rounded atelectasis that had been reassuring on bronchoscopy and PET scan.  We repeated her CT chest 08/02/2022 and there was a new left lower lobe superior segmental consolidation 4.5 x 5.4 cm.  She was subsequently admitted and treated for COVID-19 and left lower lobe cavitary pneumonia.  Swallowing evaluation during that hospitalization showed a normal oropharyngeal swallow, possible esophageal dysphagia with some residual esophageal. She has severe reflux on PPI.  She was discharged on 09/30/2022 and we performed a PET scan 10/11/2022 as below. Also discharged on home O2, 2L/min at all times. She is using albuterol nebs prn for SOB, doesn;t really help her. She is on Stiolto, unsure whether this helps her either. Still has some cough less productive. She has persistent nausea  PET scan 10/11/2022 reviewed by me, shows persistent left lower lobe rounded lesion, more superior left lower lobe focal  opacity/infiltrate with little to no hypermetabolic activity, consistent with resolving pneumonia as opposed to malignancy.    Review of Systems As per HPI     Objective:   Physical Exam Vitals:   10/13/22 1048  BP: (!) 118/58  Pulse: 98  SpO2: 96%  Weight: 228 lb 12.8 oz (103.8 kg)  Height: 5\' 6"  (1.676 m)   Gen: Pleasant, overweight woman, in no distress,  normal affect  ENT: No lesions,  mouth clear,  oropharynx clear, no postnasal drip  Neck: No JVD, no stridor  Lungs: No use of accessory muscles, bilateral inspiratory squeaks, louder on the left  Cardiovascular: RRR, heart sounds normal, no murmur or gallops, no peripheral edem  Musculoskeletal: No deformities, no cyanosis or clubbing  Neuro: alert, awake, non focal  Skin: Warm, no lesions or rash     Assessment & Plan:  Chronic hypoxic respiratory failure (HCC) She was sent home from the hospital following her admission for pneumonia with supplemental oxygen.  She is quite deconditioned and dyspneic.  We performed a walking oximetry today to confirm hypoxemia and qualify her for POC, but she was unable to walk far enough to establish desaturation.  We will keep her continuous low oxygen available for now.  Repeat her walking oximetry next time.  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  Question why she has had 2 similar pneumonias in the same region, consider chronic occult aspiration.  Her modified barium swallow showed some possible dysphagia in the esophageal phase, question reflux aspiration  We will repeat your CT scan of the chest without contrast in December 2024. Continue your pantoprazole once daily Follow Dr. Delton Coombes in December after your CT chest so  we can review those results together  COPD (chronic obstructive pulmonary disease) (HCC) Please continue Stiolto 2 puffs once daily. Keep your albuterol available to use 1 nebulizer treatment up to every 4 hours if needed for shortness of breath, chest tightness, wheezing.  Time spent 40 minutes   Levy Pupa, MD, PhD 10/13/2022, 4:30 PM [facility] [phone] or if no answer before 7:00PM call [phone] For any issues after 7:00PM please call eLink [phone]

## 2022-10-13 NOTE — Assessment & Plan Note (Signed)
Please continue Stiolto 2 puffs once daily. Keep your albuterol available to use 1 nebulizer treatment up to every 4 hours if needed for shortness of breath, chest tightness, wheezing.

## 2022-10-13 NOTE — Assessment & Plan Note (Signed)
She was sent home from the hospital following her admission for pneumonia with supplemental oxygen.  She is quite deconditioned and dyspneic.  We performed a walking oximetry today to confirm hypoxemia and qualify her for POC, but she was unable to walk far enough to establish desaturation.  We will keep her continuous low oxygen available for now.  Repeat her walking oximetry next time.  Hopefully her functional capacity will have improved and she will be able to walk far enough to establish whether she needs to continue oxygen

## 2022-10-13 NOTE — Patient Instructions (Addendum)
We will check your resting oximetry and your walking oximetry today.  This will allow [place] to determine whether you need to use oxygen when you are at rest and also hopefully allow [place] to get you a portable oxygen concentrator to use while you are ambulating. Please continue Stiolto 2 puffs once daily. Keep your albuterol available to use 1 nebulizer treatment up to every 4 hours if needed for shortness of breath, chest tightness, wheezing. We reviewed your PET scan today. We will repeat your CT scan of the chest without contrast in December 2024. Continue your pantoprazole once daily Follow Dr. Delton Coombes in December after your CT chest so we can review those results together

## 2022-10-13 NOTE — Assessment & Plan Note (Signed)
Her new left lower lobe opacity does not show significant hypermetabolism, consistent with a resolving pneumonia.  Question why she has had 2 similar pneumonias in the same region, consider chronic occult aspiration.  Her modified barium swallow showed some possible dysphagia in the esophageal phase, question reflux aspiration  We will repeat your CT scan of the chest without contrast in December 2024. Continue your pantoprazole once daily Follow Dr. Delton Coombes in December after your CT chest so we can review those results together

## 2022-10-17 ENCOUNTER — Observation Stay
Admission: EM | Admit: 2022-10-17 | Discharge: 2022-10-18 | Disposition: A | Discharge status: Home / Self Care | Payer: Medicare HMO | Attending: Hospitalist | Admitting: Hospitalist

## 2022-10-17 ENCOUNTER — Emergency Department: Payer: Medicare HMO

## 2022-10-17 ENCOUNTER — Other Ambulatory Visit: Payer: Self-pay

## 2022-10-17 DIAGNOSIS — R0602 Shortness of breath: Secondary | ICD-10-CM | POA: Diagnosis not present

## 2022-10-17 DIAGNOSIS — J811 Chronic pulmonary edema: Secondary | ICD-10-CM | POA: Diagnosis not present

## 2022-10-17 DIAGNOSIS — Z9981 Dependence on supplemental oxygen: Secondary | ICD-10-CM | POA: Diagnosis not present

## 2022-10-17 DIAGNOSIS — J189 Pneumonia, unspecified organism: Secondary | ICD-10-CM | POA: Diagnosis not present

## 2022-10-17 DIAGNOSIS — R000 Tachycardia, unspecified: Secondary | ICD-10-CM | POA: Diagnosis not present

## 2022-10-17 DIAGNOSIS — R918 Other nonspecific abnormal finding of lung field: Secondary | ICD-10-CM | POA: Diagnosis not present

## 2022-10-17 DIAGNOSIS — I10 Essential (primary) hypertension: Secondary | ICD-10-CM | POA: Insufficient documentation

## 2022-10-17 DIAGNOSIS — Z79899 Other long term (current) drug therapy: Secondary | ICD-10-CM | POA: Diagnosis not present

## 2022-10-17 DIAGNOSIS — Z7984 Long term (current) use of oral hypoglycemic drugs: Secondary | ICD-10-CM | POA: Insufficient documentation

## 2022-10-17 DIAGNOSIS — Z7982 Long term (current) use of aspirin: Secondary | ICD-10-CM | POA: Diagnosis not present

## 2022-10-17 DIAGNOSIS — J9601 Acute respiratory failure with hypoxia: Secondary | ICD-10-CM | POA: Diagnosis not present

## 2022-10-17 DIAGNOSIS — Z8616 Personal history of COVID-19: Secondary | ICD-10-CM | POA: Diagnosis not present

## 2022-10-17 DIAGNOSIS — E039 Hypothyroidism, unspecified: Secondary | ICD-10-CM | POA: Insufficient documentation

## 2022-10-17 DIAGNOSIS — J441 Chronic obstructive pulmonary disease with (acute) exacerbation: Secondary | ICD-10-CM | POA: Diagnosis present

## 2022-10-17 DIAGNOSIS — I2510 Atherosclerotic heart disease of native coronary artery without angina pectoris: Secondary | ICD-10-CM | POA: Diagnosis not present

## 2022-10-17 DIAGNOSIS — R6520 Severe sepsis without septic shock: Secondary | ICD-10-CM | POA: Diagnosis not present

## 2022-10-17 DIAGNOSIS — K7689 Other specified diseases of liver: Secondary | ICD-10-CM | POA: Diagnosis not present

## 2022-10-17 DIAGNOSIS — K5730 Diverticulosis of large intestine without perforation or abscess without bleeding: Secondary | ICD-10-CM | POA: Diagnosis not present

## 2022-10-17 DIAGNOSIS — R9389 Abnormal findings on diagnostic imaging of other specified body structures: Secondary | ICD-10-CM

## 2022-10-17 DIAGNOSIS — Z794 Long term (current) use of insulin: Secondary | ICD-10-CM | POA: Insufficient documentation

## 2022-10-17 DIAGNOSIS — K2900 Acute gastritis without bleeding: Secondary | ICD-10-CM | POA: Insufficient documentation

## 2022-10-17 DIAGNOSIS — R1011 Right upper quadrant pain: Secondary | ICD-10-CM | POA: Diagnosis not present

## 2022-10-17 DIAGNOSIS — E119 Type 2 diabetes mellitus without complications: Secondary | ICD-10-CM | POA: Insufficient documentation

## 2022-10-17 DIAGNOSIS — R1010 Upper abdominal pain, unspecified: Secondary | ICD-10-CM | POA: Diagnosis present

## 2022-10-17 DIAGNOSIS — R0989 Other specified symptoms and signs involving the circulatory and respiratory systems: Secondary | ICD-10-CM | POA: Diagnosis not present

## 2022-10-17 DIAGNOSIS — J449 Chronic obstructive pulmonary disease, unspecified: Secondary | ICD-10-CM | POA: Insufficient documentation

## 2022-10-17 DIAGNOSIS — J984 Other disorders of lung: Secondary | ICD-10-CM | POA: Diagnosis not present

## 2022-10-17 DIAGNOSIS — R109 Unspecified abdominal pain: Secondary | ICD-10-CM | POA: Diagnosis present

## 2022-10-17 DIAGNOSIS — Z87891 Personal history of nicotine dependence: Secondary | ICD-10-CM | POA: Diagnosis not present

## 2022-10-17 DIAGNOSIS — G4733 Obstructive sleep apnea (adult) (pediatric): Secondary | ICD-10-CM | POA: Insufficient documentation

## 2022-10-17 DIAGNOSIS — J9621 Acute and chronic respiratory failure with hypoxia: Secondary | ICD-10-CM | POA: Diagnosis not present

## 2022-10-17 DIAGNOSIS — D509 Iron deficiency anemia, unspecified: Secondary | ICD-10-CM | POA: Diagnosis not present

## 2022-10-17 DIAGNOSIS — K7460 Unspecified cirrhosis of liver: Secondary | ICD-10-CM | POA: Insufficient documentation

## 2022-10-17 DIAGNOSIS — A419 Sepsis, unspecified organism: Secondary | ICD-10-CM | POA: Diagnosis not present

## 2022-10-17 LAB — CBC
HCT: 28.3 % — ABNORMAL LOW (ref 36.0–46.0)
Hemoglobin: 9 g/dL — ABNORMAL LOW (ref 12.0–15.0)
MCH: 24.9 pg — ABNORMAL LOW (ref 26.0–34.0)
MCHC: 31.8 g/dL (ref 30.0–36.0)
MCV: 78.2 fL — ABNORMAL LOW (ref 80.0–100.0)
Platelets: 329 10*3/uL (ref 150–400)
RBC: 3.62 MIL/uL — ABNORMAL LOW (ref 3.87–5.11)
RDW: 16.5 % — ABNORMAL HIGH (ref 11.5–15.5)
WBC: 5 10*3/uL (ref 4.0–10.5)
nRBC: 0 % (ref 0.0–0.2)

## 2022-10-17 LAB — URINALYSIS, ROUTINE W REFLEX MICROSCOPIC
Bilirubin Urine: NEGATIVE
Glucose, UA: NEGATIVE mg/dL
Hgb urine dipstick: NEGATIVE
Ketones, ur: NEGATIVE mg/dL
Leukocytes,Ua: NEGATIVE
Nitrite: NEGATIVE
Protein, ur: NEGATIVE mg/dL
Specific Gravity, Urine: 1.016 (ref 1.005–1.030)
pH: 7 (ref 5.0–8.0)

## 2022-10-17 LAB — COMPREHENSIVE METABOLIC PANEL
ALT: 12 U/L (ref 0–44)
AST: 20 U/L (ref 15–41)
Albumin: 3 g/dL — ABNORMAL LOW (ref 3.5–5.0)
Alkaline Phosphatase: 55 U/L (ref 38–126)
Anion gap: 11 (ref 5–15)
BUN: 8 mg/dL (ref 8–23)
CO2: 23 mmol/L (ref 22–32)
Calcium: 8 mg/dL — ABNORMAL LOW (ref 8.9–10.3)
Chloride: 100 mmol/L (ref 98–111)
Creatinine, Ser: 0.98 mg/dL (ref 0.44–1.00)
GFR, Estimated: >60 mL/min (ref >60)
Glucose, Bld: 150 mg/dL — ABNORMAL HIGH (ref 70–99)
Potassium: 3.5 mmol/L (ref 3.5–5.1)
Sodium: 134 mmol/L — ABNORMAL LOW (ref 135–145)
Total Bilirubin: 0.6 mg/dL (ref 0.3–1.2)
Total Protein: 7.4 g/dL (ref 6.5–8.1)

## 2022-10-17 LAB — CBG MONITORING, ED: Glucose-Capillary: 113 mg/dL — ABNORMAL HIGH (ref 70–99)

## 2022-10-17 LAB — LIPASE, BLOOD: Lipase: 35 U/L (ref 11–51)

## 2022-10-17 LAB — LACTIC ACID, PLASMA
Lactic Acid, Venous: 1.8 mmol/L (ref 0.5–1.9)
Lactic Acid, Venous: 1.6 mmol/L (ref 0.5–1.9)

## 2022-10-17 MED ORDER — IOHEXOL 300 MG/ML  SOLN
100.0000 mL | Freq: Once | INTRAMUSCULAR | Status: AC | PRN
  Administered 2022-10-17: 100 mL via INTRAVENOUS

## 2022-10-17 MED ORDER — ONDANSETRON HCL 4 MG/2ML IJ SOLN
4.0000 mg | Freq: Once | INTRAMUSCULAR | Status: AC
  Administered 2022-10-17: 4 mg via INTRAVENOUS
  Filled 2022-10-17: qty 2

## 2022-10-17 MED ORDER — MORPHINE SULFATE (PF) 4 MG/ML IV SOLN
4.0000 mg | Freq: Once | INTRAVENOUS | Status: AC
  Administered 2022-10-17: 4 mg via INTRAVENOUS
  Filled 2022-10-17: qty 1

## 2022-10-17 MED ORDER — LACTATED RINGERS IV BOLUS
1000.0000 mL | Freq: Once | INTRAVENOUS | Status: AC
  Administered 2022-10-17: 1000 mL via INTRAVENOUS

## 2022-10-17 MED ORDER — SODIUM CHLORIDE 0.9 % IV BOLUS
1000.0000 mL | Freq: Once | INTRAVENOUS | Status: AC
  Administered 2022-10-17: 1000 mL via INTRAVENOUS

## 2022-10-17 NOTE — ED Provider Notes (Signed)
[facility] Provider Note    Event Date/Time   First MD Initiated Contact with Patient 10/17/22 1743     (approximate)   History   Chief Complaint: Abdominal Pain   HPI  Tamara [name] is a 77 y.o. female  with h/o  gerd, dm, htn who coes to the ED c/o upper abd pain x 3 days, associated with loss of appetite and diarrhea.  Reports drinking fluids but not eating.  No fever, endorses chills. No chest pain or sob. No black or bloody stool.      Physical Exam   Triage Vital Signs: ED Triage Vitals  Encounter Vitals Group     BP 10/17/22 1643 138/61     Systolic BP Percentile --      Diastolic BP Percentile --      Pulse Rate 10/17/22 1643 (!) 102     Resp 10/17/22 1643 17     Temp 10/17/22 1643 97.8 F (36.6 C)     Temp Source 10/17/22 1643 Oral     SpO2 10/17/22 1643 (S) 91 %     Weight 10/17/22 1644 226 lb (102.5 kg)     Height 10/17/22 1644 5\' 6"  (1.676 m)     Head Circumference --      Peak Flow --      Pain Score 10/17/22 1643 9     Pain Loc --      Pain Education --      Exclude from Growth Chart --     Most recent vital signs: Vitals:   10/18/22 0700 10/18/22 0748  BP: (!) 126/55   Pulse: 97   Resp:    Temp:  98 F (36.7 C)  SpO2: 95%     General: Awake, no distress.  CV:  Good peripheral perfusion. Tachycardia, rate 100. Normal distal pulses Resp:  Normal effort. ctab Abd:  No distention. Soft with generalized tenderness, non focal. Not peritoneal Other:  Trace BLE edema. Symmetric calf circumference   ED Results / Procedures / Treatments   Labs (all labs ordered are listed, but only abnormal results are displayed) Labs Reviewed  COMPREHENSIVE METABOLIC PANEL - Abnormal; Notable for the following components:      Result Value   Sodium 134 (*)    Glucose, Bld 150 (*)    Calcium 8.0 (*)    Albumin 3.0 (*)    All other components within normal limits  CBC - Abnormal; Notable for the following components:   RBC  3.62 (*)    Hemoglobin 9.0 (*)    HCT 28.3 (*)    MCV 78.2 (*)    MCH 24.9 (*)    RDW 16.5 (*)    All other components within normal limits  URINALYSIS, ROUTINE W REFLEX MICROSCOPIC - Abnormal; Notable for the following components:   Color, Urine STRAW (*)    APPearance CLEAR (*)    All other components within normal limits  CBC - Abnormal; Notable for the following components:   RBC 3.12 (*)    Hemoglobin 7.7 (*)    HCT 24.8 (*)    MCV 79.5 (*)    MCH 24.7 (*)    RDW 16.4 (*)    All other components within normal limits  CREATININE, SERUM - Abnormal; Notable for the following components:   GFR, Estimated 59 (*)    All other components within normal limits  CBG MONITORING, ED - Abnormal; Notable for the following components:   Glucose-Capillary 113 (*)  All other components within normal limits  CULTURE, BLOOD (ROUTINE X 2)  CULTURE, BLOOD (ROUTINE X 2)  LIPASE, BLOOD  LACTIC ACID, PLASMA  LACTIC ACID, PLASMA  PROCALCITONIN  HEMOGLOBIN  HEMOGLOBIN  TYPE AND SCREEN     EKG    RADIOLOGY Cxr interpreted by me, nonspecific opacities. Radiology report reviewed - similar to prior   PROCEDURES:  Procedures   MEDICATIONS ORDERED IN ED: Medications  aspirin EC tablet 81 mg (has no administration in time range)  ezetimibe (ZETIA) tablet 10 mg (has no administration in time range)  furosemide (LASIX) tablet 40 mg (has no administration in time range)  rosuvastatin (CRESTOR) tablet 20 mg (has no administration in time range)  irbesartan (AVAPRO) tablet 75 mg (has no administration in time range)  dapagliflozin propanediol (FARXIGA) tablet 5 mg (has no administration in time range)  levothyroxine (SYNTHROID) tablet 50 mcg (50 mcg Oral Given 10/18/22 9147)  lactated ringers infusion (150 mL/hr Intravenous New Bag/Given 10/18/22 0745)  acetaminophen (TYLENOL) tablet 650 mg (has no administration in time range)    Or  acetaminophen (TYLENOL) suppository 650 mg (has no  administration in time range)  ondansetron (ZOFRAN) tablet 4 mg (has no administration in time range)    Or  ondansetron (ZOFRAN) injection 4 mg (has no administration in time range)  insulin aspart (novoLOG) injection 0-20 Units (has no administration in time range)  insulin aspart (novoLOG) injection 0-5 Units (has no administration in time range)  insulin glargine-yfgn (SEMGLEE) injection 20 Units (has no administration in time range)  ceFEPIme (MAXIPIME) 2 g in sodium chloride 0.9 % 100 mL IVPB (0 g Intravenous Stopped 10/18/22 0555)  vancomycin (VANCOCIN) IVPB 1000 mg/200 mL premix (has no administration in time range)  pantoprazole (PROTONIX) injection 40 mg (40 mg Intravenous Given 10/18/22 0608)  sodium chloride 0.9 % bolus 1,000 mL (0 mLs Intravenous Stopped 10/17/22 2101)  ondansetron (ZOFRAN) injection 4 mg (4 mg Intravenous Given 10/17/22 1852)  morphine (PF) 4 MG/ML injection 4 mg (4 mg Intravenous Given 10/17/22 1852)  iohexol (OMNIPAQUE) 300 MG/ML solution 100 mL (100 mLs Intravenous Contrast Given 10/17/22 1912)  lactated ringers bolus 1,000 mL (0 mLs Intravenous Stopped 10/18/22 0128)  ipratropium-albuterol (DUONEB) 0.5-2.5 (3) MG/3ML nebulizer solution 3 mL (3 mLs Nebulization Given 10/18/22 0122)  ipratropium-albuterol (DUONEB) 0.5-2.5 (3) MG/3ML nebulizer solution 3 mL (3 mLs Nebulization Given 10/18/22 0122)  ipratropium-albuterol (DUONEB) 0.5-2.5 (3) MG/3ML nebulizer solution 3 mL (3 mLs Nebulization Given 10/18/22 0121)  methylPREDNISolone sodium succinate (SOLU-MEDROL) 125 mg/2 mL injection 125 mg (125 mg Intravenous Given 10/18/22 0453)  iohexol (OMNIPAQUE) 350 MG/ML injection 60 mL (60 mLs Intravenous Contrast Given 10/18/22 0244)  lactated ringers bolus 1,000 mL (1,000 mLs Intravenous New Bag/Given 10/18/22 0554)  vancomycin (VANCOCIN) IVPB 1000 mg/200 mL premix (0 mg Intravenous Stopped 10/18/22 0555)    Followed by  vancomycin (VANCOREADY) IVPB 1500 mg/300 mL (1,500 mg  Intravenous New Bag/Given 10/18/22 0617)     IMPRESSION / MDM / ASSESSMENT AND PLAN / ED COURSE  I reviewed the triage vital signs and the nursing notes.  DDx: viral gastroenteritis, diverticulitis, appendicitis, cholecystitis, dehydration, electrolyte derangement, UTI  Patient's presentation is most consistent with acute presentation with potential threat to life or bodily function.  Patient presents with generalized abdominal pain and vomiting.  Exam is nonfocal.  CT scan obtained which is unremarkable.  Labs reassuring.  On repeat assessment, tenderness is more localized to the upper abdomen.  Ultrasound obtained to evaluate gallbladder, appears normal.  -----------------------------------------  12:04 AM on 10/18/2022 ----------------------------------------- Feeling better, tolerating oral intake, suspect viral syndrome.  Recommend maximal antacid therapy, follow-up with gastroenterology.   Clinical Course as of 10/18/22 0901  Mon Oct 18, 2022  1610 CT Angio Chest PE W/Cm &/Or Wo Cm I viewed and interpreted the patient's CTA chest and it is worrisome for multifocal pneumonia, which the radiologist also mentioned. [CF]  [phone] Given the patient's persistent tachycardia and hypoxia, I am treating empirically for multifocal pneumonia with ceftriaxone 2 g IV and azithromycin 500 mg IV.  Of note, she does not have an elevated lactic acid and does not meet criteria for severe sepsis, but given that she meets SIRS criteria and has a source, I will make her a code sepsis.  I am consulting the hospitalist for admission. [CF]  [phone] Of note, I am treating with IV fluids for ideal body weight, which gives a goal of 1.8 L IV.  She has already received 2 L of crystalloid prior to me taking over care for her.  Given that she is still tachycardic, I am ordering 1/3 L of fluids, but since her ideal body weight places are well below this goal, she is receiving plenty of fluids to meet the sepsis  requirements. [CF]  [phone] Consulted with Dr. Para March with the hospitalist service who will admit the patient [CF]    Clinical Course User Index [CF] Loleta Rose, MD     FINAL CLINICAL IMPRESSION(S) / ED DIAGNOSES   Final diagnoses:  Pain of upper abdomen  Acute respiratory failure with hypoxia (HCC)  Sepsis, due to unspecified organism, unspecified whether acute organ dysfunction present [facility])  Multifocal pneumonia     Rx / DC Orders   ED Discharge Orders          Ordered    sucralfate (CARAFATE) 1 g tablet  4 times daily        10/18/22 0003    famotidine (PEPCID) 20 MG tablet  2 times daily        10/18/22 0003    metoCLOPramide (REGLAN) 10 MG tablet  Every 6 hours PRN        10/18/22 0003             Note:  This document was prepared using Dragon voice recognition software and may include unintentional dictation errors.   Sharman Cheek, MD 10/18/22 [phone]

## 2022-10-17 NOTE — ED Triage Notes (Signed)
Upper abd pain x3 days. Pt reports loss of appetite and diarrhea.

## 2022-10-18 ENCOUNTER — Emergency Department: Payer: Medicare HMO

## 2022-10-18 ENCOUNTER — Encounter: Payer: Self-pay | Admitting: Internal Medicine

## 2022-10-18 DIAGNOSIS — J9621 Acute and chronic respiratory failure with hypoxia: Secondary | ICD-10-CM

## 2022-10-18 DIAGNOSIS — R0989 Other specified symptoms and signs involving the circulatory and respiratory systems: Secondary | ICD-10-CM | POA: Diagnosis not present

## 2022-10-18 DIAGNOSIS — R109 Unspecified abdominal pain: Secondary | ICD-10-CM | POA: Diagnosis present

## 2022-10-18 DIAGNOSIS — R0602 Shortness of breath: Secondary | ICD-10-CM | POA: Diagnosis not present

## 2022-10-18 DIAGNOSIS — J984 Other disorders of lung: Secondary | ICD-10-CM | POA: Diagnosis not present

## 2022-10-18 DIAGNOSIS — J189 Pneumonia, unspecified organism: Secondary | ICD-10-CM | POA: Diagnosis not present

## 2022-10-18 DIAGNOSIS — G4733 Obstructive sleep apnea (adult) (pediatric): Secondary | ICD-10-CM | POA: Insufficient documentation

## 2022-10-18 DIAGNOSIS — I2510 Atherosclerotic heart disease of native coronary artery without angina pectoris: Secondary | ICD-10-CM | POA: Diagnosis not present

## 2022-10-18 DIAGNOSIS — R918 Other nonspecific abnormal finding of lung field: Secondary | ICD-10-CM | POA: Diagnosis not present

## 2022-10-18 DIAGNOSIS — J811 Chronic pulmonary edema: Secondary | ICD-10-CM | POA: Diagnosis not present

## 2022-10-18 LAB — CBC
HCT: 24.8 % — ABNORMAL LOW (ref 36.0–46.0)
Hemoglobin: 7.7 g/dL — ABNORMAL LOW (ref 12.0–15.0)
MCH: 24.7 pg — ABNORMAL LOW (ref 26.0–34.0)
MCHC: 31 g/dL (ref 30.0–36.0)
MCV: 79.5 fL — ABNORMAL LOW (ref 80.0–100.0)
Platelets: 262 10*3/uL (ref 150–400)
RBC: 3.12 MIL/uL — ABNORMAL LOW (ref 3.87–5.11)
RDW: 16.4 % — ABNORMAL HIGH (ref 11.5–15.5)
WBC: 5.3 10*3/uL (ref 4.0–10.5)
nRBC: 0 % (ref 0.0–0.2)

## 2022-10-18 LAB — IRON AND TIBC
Iron: 22 ug/dL — ABNORMAL LOW (ref 28–170)
Saturation Ratios: 9 % — ABNORMAL LOW (ref 10.4–31.8)
TIBC: 239 ug/dL — ABNORMAL LOW (ref 250–450)
UIBC: 217 ug/dL

## 2022-10-18 LAB — HEMOGLOBIN: Hemoglobin: 8.5 g/dL — ABNORMAL LOW (ref 12.0–15.0)

## 2022-10-18 LAB — TYPE AND SCREEN
ABO/RH(D): O POS
Antibody Screen: NEGATIVE

## 2022-10-18 LAB — CBG MONITORING, ED
Glucose-Capillary: 245 mg/dL — ABNORMAL HIGH (ref 70–99)
Glucose-Capillary: 248 mg/dL — ABNORMAL HIGH (ref 70–99)

## 2022-10-18 LAB — PROCALCITONIN: Procalcitonin: <0.1 ng/mL

## 2022-10-18 LAB — FERRITIN: Ferritin: 46 ng/mL (ref 11–307)

## 2022-10-18 LAB — FOLATE: Folate: 15.3 ng/mL (ref >5.9)

## 2022-10-18 LAB — CREATININE, SERUM
Creatinine, Ser: 0.99 mg/dL (ref 0.44–1.00)
GFR, Estimated: 59 mL/min — ABNORMAL LOW (ref >60)

## 2022-10-18 MED ORDER — METOCLOPRAMIDE HCL 10 MG PO TABS: 10.0000 mg | ORAL_TABLET | Freq: Four times a day (QID) | ORAL | 0 refills | Status: AC | PRN

## 2022-10-18 MED ORDER — IPRATROPIUM-ALBUTEROL 0.5-2.5 (3) MG/3ML IN SOLN
3.0000 mL | Freq: Once | RESPIRATORY_TRACT | Status: AC
  Administered 2022-10-18: 3 mL via RESPIRATORY_TRACT
  Filled 2022-10-18: qty 3

## 2022-10-18 MED ORDER — LEVOTHYROXINE SODIUM 50 MCG PO TABS
50.0000 ug | ORAL_TABLET | Freq: Every day | ORAL | Status: DC
  Administered 2022-10-18: 50 ug via ORAL
  Filled 2022-10-18: qty 1

## 2022-10-18 MED ORDER — ASPIRIN 81 MG PO TBEC
81.0000 mg | DELAYED_RELEASE_TABLET | Freq: Every day | ORAL | Status: DC
  Administered 2022-10-18: 81 mg via ORAL
  Filled 2022-10-18: qty 1

## 2022-10-18 MED ORDER — ENOXAPARIN SODIUM 60 MG/0.6ML IJ SOSY: 0.5000 mg/kg | PREFILLED_SYRINGE | INTRAMUSCULAR | Status: DC

## 2022-10-18 MED ORDER — SODIUM CHLORIDE 0.9 % IV SOLN: 2.0000 g | Freq: Once | INTRAVENOUS | Status: DC

## 2022-10-18 MED ORDER — FUROSEMIDE 40 MG PO TABS
40.0000 mg | ORAL_TABLET | Freq: Every day | ORAL | Status: DC
  Administered 2022-10-18: 40 mg via ORAL
  Filled 2022-10-18: qty 1

## 2022-10-18 MED ORDER — ACETAMINOPHEN 650 MG RE SUPP: 650.0000 mg | Freq: Four times a day (QID) | RECTAL | Status: DC | PRN

## 2022-10-18 MED ORDER — INSULIN ASPART 100 UNIT/ML IJ SOLN: 0.0000 [IU] | Freq: Every day | INTRAMUSCULAR | Status: DC

## 2022-10-18 MED ORDER — IOHEXOL 350 MG/ML SOLN
60.0000 mL | Freq: Once | INTRAVENOUS | Status: AC | PRN
  Administered 2022-10-18: 60 mL via INTRAVENOUS

## 2022-10-18 MED ORDER — SODIUM CHLORIDE 0.9 % IV SOLN
2.0000 g | Freq: Two times a day (BID) | INTRAVENOUS | Status: DC
  Administered 2022-10-18: 2 g via INTRAVENOUS
  Filled 2022-10-18: qty 12.5

## 2022-10-18 MED ORDER — DAPAGLIFLOZIN PROPANEDIOL 5 MG PO TABS
5.0000 mg | ORAL_TABLET | Freq: Every day | ORAL | Status: DC
  Administered 2022-10-18: 5 mg via ORAL
  Filled 2022-10-18: qty 1

## 2022-10-18 MED ORDER — IRBESARTAN 75 MG PO TABS
75.0000 mg | ORAL_TABLET | Freq: Every day | ORAL | Status: DC
  Administered 2022-10-18: 75 mg via ORAL
  Filled 2022-10-18: qty 1

## 2022-10-18 MED ORDER — PANTOPRAZOLE SODIUM 40 MG IV SOLR
40.0000 mg | INTRAVENOUS | Status: DC
  Administered 2022-10-18: 40 mg via INTRAVENOUS
  Filled 2022-10-18: qty 10

## 2022-10-18 MED ORDER — SODIUM CHLORIDE 0.9 % IV SOLN: 2.0000 g | INTRAVENOUS | Status: DC

## 2022-10-18 MED ORDER — ROSUVASTATIN CALCIUM 20 MG PO TABS: 20.0000 mg | ORAL_TABLET | Freq: Every day | ORAL | Status: DC

## 2022-10-18 MED ORDER — METHYLPREDNISOLONE SODIUM SUCC 125 MG IJ SOLR
125.0000 mg | Freq: Once | INTRAMUSCULAR | Status: AC
  Administered 2022-10-18: 125 mg via INTRAVENOUS
  Filled 2022-10-18: qty 2

## 2022-10-18 MED ORDER — SUCRALFATE 1 G PO TABS: 1.0000 g | ORAL_TABLET | Freq: Four times a day (QID) | ORAL | 1 refills | Status: AC

## 2022-10-18 MED ORDER — ONDANSETRON HCL 4 MG PO TABS: 4.0000 mg | ORAL_TABLET | Freq: Four times a day (QID) | ORAL | Status: DC | PRN

## 2022-10-18 MED ORDER — SODIUM CHLORIDE 0.9 % IV SOLN: 500.0000 mg | INTRAVENOUS | Status: DC

## 2022-10-18 MED ORDER — POLYSACCHARIDE IRON COMPLEX 150 MG PO CAPS: 150.0000 mg | ORAL_CAPSULE | Freq: Every day | ORAL | Status: DC

## 2022-10-18 MED ORDER — FAMOTIDINE 20 MG PO TABS: 20.0000 mg | ORAL_TABLET | Freq: Two times a day (BID) | ORAL | 0 refills | Status: DC

## 2022-10-18 MED ORDER — EZETIMIBE 10 MG PO TABS
10.0000 mg | ORAL_TABLET | Freq: Every day | ORAL | Status: DC
  Administered 2022-10-18: 10 mg via ORAL
  Filled 2022-10-18: qty 1

## 2022-10-18 MED ORDER — PANTOPRAZOLE SODIUM 40 MG PO TBEC: 40.0000 mg | DELAYED_RELEASE_TABLET | Freq: Two times a day (BID) | ORAL | Status: DC

## 2022-10-18 MED ORDER — LACTATED RINGERS IV BOLUS (SEPSIS)
1000.0000 mL | Freq: Once | INTRAVENOUS | Status: AC
  Administered 2022-10-18: 1000 mL via INTRAVENOUS

## 2022-10-18 MED ORDER — ONDANSETRON HCL 4 MG/2ML IJ SOLN: 4.0000 mg | Freq: Four times a day (QID) | INTRAMUSCULAR | Status: DC | PRN

## 2022-10-18 MED ORDER — VANCOMYCIN HCL 1500 MG/300ML IV SOLN
1500.0000 mg | Freq: Once | INTRAVENOUS | Status: AC
  Administered 2022-10-18: 1500 mg via INTRAVENOUS
  Filled 2022-10-18: qty 300

## 2022-10-18 MED ORDER — INSULIN ASPART 100 UNIT/ML IJ SOLN
0.0000 [IU] | Freq: Three times a day (TID) | INTRAMUSCULAR | Status: DC
  Administered 2022-10-18: 7 [IU] via SUBCUTANEOUS
  Administered 2022-10-18: 11 [IU] via SUBCUTANEOUS
  Filled 2022-10-18 (×2): qty 1

## 2022-10-18 MED ORDER — ONDANSETRON HCL 4 MG PO TABS: 4.0000 mg | ORAL_TABLET | Freq: Four times a day (QID) | ORAL | 0 refills | Status: DC | PRN

## 2022-10-18 MED ORDER — VANCOMYCIN HCL IN DEXTROSE 1-5 GM/200ML-% IV SOLN: 1000.0000 mg | Freq: Once | INTRAVENOUS | Status: DC

## 2022-10-18 MED ORDER — INSULIN GLARGINE-YFGN 100 UNIT/ML ~~LOC~~ SOLN
20.0000 [IU] | Freq: Every day | SUBCUTANEOUS | Status: DC
  Filled 2022-10-18: qty 0.2

## 2022-10-18 MED ORDER — VANCOMYCIN HCL IN DEXTROSE 1-5 GM/200ML-% IV SOLN
1000.0000 mg | Freq: Once | INTRAVENOUS | Status: AC
  Administered 2022-10-18: 1000 mg via INTRAVENOUS
  Filled 2022-10-18: qty 200

## 2022-10-18 MED ORDER — SODIUM CHLORIDE 0.9 % IV SOLN: 2.0000 g | Freq: Three times a day (TID) | INTRAVENOUS | Status: DC

## 2022-10-18 MED ORDER — ACETAMINOPHEN 325 MG PO TABS: 650.0000 mg | ORAL_TABLET | Freq: Four times a day (QID) | ORAL | Status: DC | PRN

## 2022-10-18 MED ORDER — LACTATED RINGERS IV SOLN
150.0000 mL/h | INTRAVENOUS | Status: DC
  Administered 2022-10-18: 150 mL/h via INTRAVENOUS

## 2022-10-18 MED ORDER — VANCOMYCIN HCL IN DEXTROSE 1-5 GM/200ML-% IV SOLN: 1000.0000 mg | INTRAVENOUS | Status: DC

## 2022-10-18 NOTE — Discharge Summary (Signed)
Physician Discharge Summary   VASHON ARCH  female DOB: 11-29-1945  OZD:[id number]  PCP: Tamara Mail, DO  Admit date: 10/17/2022 Discharge date: 10/18/2022  Admitted From: home Disposition:  home Husband updated at bedside prior to discharge.  CODE STATUS: Full code  Discharge Instructions     Diet Carb Modified   Complete by: As directed    Discharge instructions   Complete by: As directed    CT scan of your abdomen was normal.  You upper abdominal pain may be due to inflammation of your stomach or small intestine.  ED doctor had prescribed you Pepcid and Carafate to help with the pain.  Continue to take your home protonix.  I have prescribed Zofran for your nausea.    It does not appear that you have a new pneumonia.  You are already on 2 liters of oxygen at home which you should continue.  You have iron deficiency anemia.  Please take any over-the-counter iron supplement. [facility] Course:  For full details, please see H&P, progress notes, consult notes and ancillary notes.  Briefly,  Tamara [name] is a 77 y.o. 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  Abdominal pain possibly due to gastritis History of gastritis on EGD 2019 Patient complains of 3 days of severe epigastric pain associated with nausea.  States she is unable to eat any solids and could only tolerate liquids Lipase and LFTs WNL. CT abdomen and pelvis and RUQ ultrasound unremarkable. --pt is on home PPI however does not take consistently. --after admission, pt was tolerating oral intake and was vague about  the state of her abdominal pain.  Pt reported she was ready to be discharged.   --cont home PPI.  D/c home Mobic. --Pepcid, Reglan and Carafate Rx by ED provider.  Chronic Anemia Iron def --Hgb noted to be gradually trending down for the past year, from 10's down to 8's.   --anemia workup showed iron def.  Pt was started on oral iron supplement.  * Multifocal pneumonia, ruled out Possible sepsis, ruled out --CTA chest showed "Diffuse bilateral pulmonary infiltrates with areas of consolidation, airspace disease, interstitial disease. Similar pattern to previous study."  Pt was just treated for PNA about 2 weeks ago, unlikely to have developed a new PNA.  Pt did not have worsening respiratory status, and pt did not have new hypoxia as pt was still on home 2L O2 (at recent pulm f/u on 10/13/22, rec was to continue home O2).  Abx started on presentation, but were not continued.  Chronic respiratory failure with hypoxia on 2L O2 Acute hypoxemic respiratory failure ruled out COPD exacerbation, ruled out --pt is deconditioned and dyspneic at baseline.  Pt did not have worsening respiratory status, and pt did not have new hypoxia as pt was still on home 2L O2 (at recent pulm f/u on 10/13/22, rec was to continue home O2).   --cont home bronchodilator   OSA (obstructive sleep apnea) Continue CPAP   Coronary artery disease involving native heart Continue aspirin and rosuvastatin   Hepatic cirrhosis (HCC) Not decompensated --cont home lasix  is no hydronephrosis on either side. There is symmetric enhancement and excretion of contrast by both kidneys. The visualized ureters and urinary bladder appear unremarkable. Stomach/Bowel: There is sigmoid diverticulosis with muscular hypertrophy. No active inflammatory changes. There is no bowel obstruction or active inflammation. The appendix is normal. Vascular/Lymphatic: Advanced aortoiliac atherosclerotic disease. The aorta is ectatic measuring 2.6 cm in maximal diameter. The IVC is unremarkable. No portal venous gas. There is no adenopathy. Reproductive: The uterus is retroflexed.  No adnexal masses. Other: None Musculoskeletal: Osteopenia with degenerative changes of the spine. No acute osseous pathology. IMPRESSION: 1. No acute intra-abdominal or pelvic pathology. 2. Cirrhosis. 3. Sigmoid diverticulosis. No bowel obstruction. Normal appendix. 4. Pulmonary opacities, worsened since the CT of 02/06/2021. 5.  Aortic Atherosclerosis (ICD10-I70.0). Electronically Signed   By: Tamara [name] M.D.   On: 10/17/2022 19:33   DG Chest Port 1 View  Result Date:  09/28/2022 CLINICAL DATA:  Shortness of breath, COVID positive EXAM: PORTABLE CHEST - 1 VIEW COMPARISON:  09/23/2022 FINDINGS: Persistent bibasilar interstitial and airspace opacities left greater than right. Worsening right upper lobe opacities. Heart size and mediastinal contours are within normal limits. Aortic Atherosclerosis (ICD10-170.0). No effusion. Visualized bones unremarkable. IMPRESSION: Persistent bibasilar and worsening right upper lobe opacities. Electronically Signed   By: Tamara [name] M.D.   On: 09/28/2022 14:18   DG Swallowing Func-Speech Pathology  Result Date: 09/27/2022 Table formatting from the original result was not included. Modified Barium Swallow Study Patient Details Name: Tamara [name] MRN: [id number] Date of Birth: Aug 22, 1945 Today's Date: 09/27/2022 HPI/PMH: HPI: 77 y.o. female presented at [facility], ED with complaining of fever, worsening of shortness of breath and productive cough. +COVID pna,  Past medical history of HTN, HLD, IDDM T2, COPD, hypothyroid, OSA, GERD, cavitary lung mass Clinical Impression: Clinical Impression: Pt demonstrated normal oropharyngeal swallow function. Possible esophageal dysphagia is suspected. She coughed once prior to po's likely from Covid diagnosis. Her tongue control, bolus preparation/mastication and transport were all within functional limits. Swallows were initiated timely with adequate strength to prevent aspiration. During pill consumption there was one trace, flash penetration of thin liquid that exited vestibule during the swallow. All boluses moved through her pharynx into upper esophageal sphincter without any residual. The pill stopped at the GE junction and did not move into the stomach given additional thin liquid. There was mild, possibly moderate residue seen in her esophagus at end of study which may account for her complaint of coughing at the end of meals. Recommend continue regular texture, thin liquids, pills with thin, stay  sitting upright after meals, alternate liquids and solids and therapist educated pt on strategies. Pt states she sleeps in a reclined position at home due to her reflux. No further ST warranted at this time. Factors that may increase risk of adverse event in presence of aspiration [name] & [name] 2021): No data recorded Recommendations/Plan: Swallowing Evaluation Recommendations Swallowing Evaluation Recommendations Recommendations: PO diet PO Diet Recommendation: Regular; Thin liquids (Level 0) Liquid Administration via: Cup; Straw Medication Administration: Whole meds with liquid Supervision: Patient able to self-feed Swallowing strategies  : Small bites/sips; Slow rate Postural changes: Stay upright 30-60 min after meals; Position pt fully upright for meals Oral care recommendations: Oral care BID (2x/day) Treatment Plan Treatment Plan Treatment recommendations: No treatment recommended at this time Follow-up recommendations: No SLP follow up Functional status assessment: Patient has not had a recent decline in their functional status. Recommendations Recommendations for follow up therapy are one component  is no hydronephrosis on either side. There is symmetric enhancement and excretion of contrast by both kidneys. The visualized ureters and urinary bladder appear unremarkable. Stomach/Bowel: There is sigmoid diverticulosis with muscular hypertrophy. No active inflammatory changes. There is no bowel obstruction or active inflammation. The appendix is normal. Vascular/Lymphatic: Advanced aortoiliac atherosclerotic disease. The aorta is ectatic measuring 2.6 cm in maximal diameter. The IVC is unremarkable. No portal venous gas. There is no adenopathy. Reproductive: The uterus is retroflexed.  No adnexal masses. Other: None Musculoskeletal: Osteopenia with degenerative changes of the spine. No acute osseous pathology. IMPRESSION: 1. No acute intra-abdominal or pelvic pathology. 2. Cirrhosis. 3. Sigmoid diverticulosis. No bowel obstruction. Normal appendix. 4. Pulmonary opacities, worsened since the CT of 02/06/2021. 5.  Aortic Atherosclerosis (ICD10-I70.0). Electronically Signed   By: Tamara [name] M.D.   On: 10/17/2022 19:33   DG Chest Port 1 View  Result Date:  09/28/2022 CLINICAL DATA:  Shortness of breath, COVID positive EXAM: PORTABLE CHEST - 1 VIEW COMPARISON:  09/23/2022 FINDINGS: Persistent bibasilar interstitial and airspace opacities left greater than right. Worsening right upper lobe opacities. Heart size and mediastinal contours are within normal limits. Aortic Atherosclerosis (ICD10-170.0). No effusion. Visualized bones unremarkable. IMPRESSION: Persistent bibasilar and worsening right upper lobe opacities. Electronically Signed   By: Tamara [name] M.D.   On: 09/28/2022 14:18   DG Swallowing Func-Speech Pathology  Result Date: 09/27/2022 Table formatting from the original result was not included. Modified Barium Swallow Study Patient Details Name: Tamara [name] MRN: [id number] Date of Birth: Aug 22, 1945 Today's Date: 09/27/2022 HPI/PMH: HPI: 77 y.o. female presented at [facility], ED with complaining of fever, worsening of shortness of breath and productive cough. +COVID pna,  Past medical history of HTN, HLD, IDDM T2, COPD, hypothyroid, OSA, GERD, cavitary lung mass Clinical Impression: Clinical Impression: Pt demonstrated normal oropharyngeal swallow function. Possible esophageal dysphagia is suspected. She coughed once prior to po's likely from Covid diagnosis. Her tongue control, bolus preparation/mastication and transport were all within functional limits. Swallows were initiated timely with adequate strength to prevent aspiration. During pill consumption there was one trace, flash penetration of thin liquid that exited vestibule during the swallow. All boluses moved through her pharynx into upper esophageal sphincter without any residual. The pill stopped at the GE junction and did not move into the stomach given additional thin liquid. There was mild, possibly moderate residue seen in her esophagus at end of study which may account for her complaint of coughing at the end of meals. Recommend continue regular texture, thin liquids, pills with thin, stay  sitting upright after meals, alternate liquids and solids and therapist educated pt on strategies. Pt states she sleeps in a reclined position at home due to her reflux. No further ST warranted at this time. Factors that may increase risk of adverse event in presence of aspiration [name] & [name] 2021): No data recorded Recommendations/Plan: Swallowing Evaluation Recommendations Swallowing Evaluation Recommendations Recommendations: PO diet PO Diet Recommendation: Regular; Thin liquids (Level 0) Liquid Administration via: Cup; Straw Medication Administration: Whole meds with liquid Supervision: Patient able to self-feed Swallowing strategies  : Small bites/sips; Slow rate Postural changes: Stay upright 30-60 min after meals; Position pt fully upright for meals Oral care recommendations: Oral care BID (2x/day) Treatment Plan Treatment Plan Treatment recommendations: No treatment recommended at this time Follow-up recommendations: No SLP follow up Functional status assessment: Patient has not had a recent decline in their functional status. Recommendations Recommendations for follow up therapy are one component  Physician Discharge Summary   VASHON ARCH  female DOB: 11-29-1945  OZD:[id number]  PCP: Tamara Mail, DO  Admit date: 10/17/2022 Discharge date: 10/18/2022  Admitted From: home Disposition:  home Husband updated at bedside prior to discharge.  CODE STATUS: Full code  Discharge Instructions     Diet Carb Modified   Complete by: As directed    Discharge instructions   Complete by: As directed    CT scan of your abdomen was normal.  You upper abdominal pain may be due to inflammation of your stomach or small intestine.  ED doctor had prescribed you Pepcid and Carafate to help with the pain.  Continue to take your home protonix.  I have prescribed Zofran for your nausea.    It does not appear that you have a new pneumonia.  You are already on 2 liters of oxygen at home which you should continue.  You have iron deficiency anemia.  Please take any over-the-counter iron supplement. [facility] Course:  For full details, please see H&P, progress notes, consult notes and ancillary notes.  Briefly,  Tamara [name] is a 77 y.o. 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  Abdominal pain possibly due to gastritis History of gastritis on EGD 2019 Patient complains of 3 days of severe epigastric pain associated with nausea.  States she is unable to eat any solids and could only tolerate liquids Lipase and LFTs WNL. CT abdomen and pelvis and RUQ ultrasound unremarkable. --pt is on home PPI however does not take consistently. --after admission, pt was tolerating oral intake and was vague about  the state of her abdominal pain.  Pt reported she was ready to be discharged.   --cont home PPI.  D/c home Mobic. --Pepcid, Reglan and Carafate Rx by ED provider.  Chronic Anemia Iron def --Hgb noted to be gradually trending down for the past year, from 10's down to 8's.   --anemia workup showed iron def.  Pt was started on oral iron supplement.  * Multifocal pneumonia, ruled out Possible sepsis, ruled out --CTA chest showed "Diffuse bilateral pulmonary infiltrates with areas of consolidation, airspace disease, interstitial disease. Similar pattern to previous study."  Pt was just treated for PNA about 2 weeks ago, unlikely to have developed a new PNA.  Pt did not have worsening respiratory status, and pt did not have new hypoxia as pt was still on home 2L O2 (at recent pulm f/u on 10/13/22, rec was to continue home O2).  Abx started on presentation, but were not continued.  Chronic respiratory failure with hypoxia on 2L O2 Acute hypoxemic respiratory failure ruled out COPD exacerbation, ruled out --pt is deconditioned and dyspneic at baseline.  Pt did not have worsening respiratory status, and pt did not have new hypoxia as pt was still on home 2L O2 (at recent pulm f/u on 10/13/22, rec was to continue home O2).   --cont home bronchodilator   OSA (obstructive sleep apnea) Continue CPAP   Coronary artery disease involving native heart Continue aspirin and rosuvastatin   Hepatic cirrhosis (HCC) Not decompensated --cont home lasix  Physician Discharge Summary   VASHON ARCH  female DOB: 11-29-1945  OZD:[id number]  PCP: Tamara Mail, DO  Admit date: 10/17/2022 Discharge date: 10/18/2022  Admitted From: home Disposition:  home Husband updated at bedside prior to discharge.  CODE STATUS: Full code  Discharge Instructions     Diet Carb Modified   Complete by: As directed    Discharge instructions   Complete by: As directed    CT scan of your abdomen was normal.  You upper abdominal pain may be due to inflammation of your stomach or small intestine.  ED doctor had prescribed you Pepcid and Carafate to help with the pain.  Continue to take your home protonix.  I have prescribed Zofran for your nausea.    It does not appear that you have a new pneumonia.  You are already on 2 liters of oxygen at home which you should continue.  You have iron deficiency anemia.  Please take any over-the-counter iron supplement. [facility] Course:  For full details, please see H&P, progress notes, consult notes and ancillary notes.  Briefly,  Tamara [name] is a 77 y.o. 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  Abdominal pain possibly due to gastritis History of gastritis on EGD 2019 Patient complains of 3 days of severe epigastric pain associated with nausea.  States she is unable to eat any solids and could only tolerate liquids Lipase and LFTs WNL. CT abdomen and pelvis and RUQ ultrasound unremarkable. --pt is on home PPI however does not take consistently. --after admission, pt was tolerating oral intake and was vague about  the state of her abdominal pain.  Pt reported she was ready to be discharged.   --cont home PPI.  D/c home Mobic. --Pepcid, Reglan and Carafate Rx by ED provider.  Chronic Anemia Iron def --Hgb noted to be gradually trending down for the past year, from 10's down to 8's.   --anemia workup showed iron def.  Pt was started on oral iron supplement.  * Multifocal pneumonia, ruled out Possible sepsis, ruled out --CTA chest showed "Diffuse bilateral pulmonary infiltrates with areas of consolidation, airspace disease, interstitial disease. Similar pattern to previous study."  Pt was just treated for PNA about 2 weeks ago, unlikely to have developed a new PNA.  Pt did not have worsening respiratory status, and pt did not have new hypoxia as pt was still on home 2L O2 (at recent pulm f/u on 10/13/22, rec was to continue home O2).  Abx started on presentation, but were not continued.  Chronic respiratory failure with hypoxia on 2L O2 Acute hypoxemic respiratory failure ruled out COPD exacerbation, ruled out --pt is deconditioned and dyspneic at baseline.  Pt did not have worsening respiratory status, and pt did not have new hypoxia as pt was still on home 2L O2 (at recent pulm f/u on 10/13/22, rec was to continue home O2).   --cont home bronchodilator   OSA (obstructive sleep apnea) Continue CPAP   Coronary artery disease involving native heart Continue aspirin and rosuvastatin   Hepatic cirrhosis (HCC) Not decompensated --cont home lasix  is no hydronephrosis on either side. There is symmetric enhancement and excretion of contrast by both kidneys. The visualized ureters and urinary bladder appear unremarkable. Stomach/Bowel: There is sigmoid diverticulosis with muscular hypertrophy. No active inflammatory changes. There is no bowel obstruction or active inflammation. The appendix is normal. Vascular/Lymphatic: Advanced aortoiliac atherosclerotic disease. The aorta is ectatic measuring 2.6 cm in maximal diameter. The IVC is unremarkable. No portal venous gas. There is no adenopathy. Reproductive: The uterus is retroflexed.  No adnexal masses. Other: None Musculoskeletal: Osteopenia with degenerative changes of the spine. No acute osseous pathology. IMPRESSION: 1. No acute intra-abdominal or pelvic pathology. 2. Cirrhosis. 3. Sigmoid diverticulosis. No bowel obstruction. Normal appendix. 4. Pulmonary opacities, worsened since the CT of 02/06/2021. 5.  Aortic Atherosclerosis (ICD10-I70.0). Electronically Signed   By: Tamara [name] M.D.   On: 10/17/2022 19:33   DG Chest Port 1 View  Result Date:  09/28/2022 CLINICAL DATA:  Shortness of breath, COVID positive EXAM: PORTABLE CHEST - 1 VIEW COMPARISON:  09/23/2022 FINDINGS: Persistent bibasilar interstitial and airspace opacities left greater than right. Worsening right upper lobe opacities. Heart size and mediastinal contours are within normal limits. Aortic Atherosclerosis (ICD10-170.0). No effusion. Visualized bones unremarkable. IMPRESSION: Persistent bibasilar and worsening right upper lobe opacities. Electronically Signed   By: Tamara [name] M.D.   On: 09/28/2022 14:18   DG Swallowing Func-Speech Pathology  Result Date: 09/27/2022 Table formatting from the original result was not included. Modified Barium Swallow Study Patient Details Name: Tamara [name] MRN: [id number] Date of Birth: Aug 22, 1945 Today's Date: 09/27/2022 HPI/PMH: HPI: 77 y.o. female presented at [facility], ED with complaining of fever, worsening of shortness of breath and productive cough. +COVID pna,  Past medical history of HTN, HLD, IDDM T2, COPD, hypothyroid, OSA, GERD, cavitary lung mass Clinical Impression: Clinical Impression: Pt demonstrated normal oropharyngeal swallow function. Possible esophageal dysphagia is suspected. She coughed once prior to po's likely from Covid diagnosis. Her tongue control, bolus preparation/mastication and transport were all within functional limits. Swallows were initiated timely with adequate strength to prevent aspiration. During pill consumption there was one trace, flash penetration of thin liquid that exited vestibule during the swallow. All boluses moved through her pharynx into upper esophageal sphincter without any residual. The pill stopped at the GE junction and did not move into the stomach given additional thin liquid. There was mild, possibly moderate residue seen in her esophagus at end of study which may account for her complaint of coughing at the end of meals. Recommend continue regular texture, thin liquids, pills with thin, stay  sitting upright after meals, alternate liquids and solids and therapist educated pt on strategies. Pt states she sleeps in a reclined position at home due to her reflux. No further ST warranted at this time. Factors that may increase risk of adverse event in presence of aspiration [name] & [name] 2021): No data recorded Recommendations/Plan: Swallowing Evaluation Recommendations Swallowing Evaluation Recommendations Recommendations: PO diet PO Diet Recommendation: Regular; Thin liquids (Level 0) Liquid Administration via: Cup; Straw Medication Administration: Whole meds with liquid Supervision: Patient able to self-feed Swallowing strategies  : Small bites/sips; Slow rate Postural changes: Stay upright 30-60 min after meals; Position pt fully upright for meals Oral care recommendations: Oral care BID (2x/day) Treatment Plan Treatment Plan Treatment recommendations: No treatment recommended at this time Follow-up recommendations: No SLP follow up Functional status assessment: Patient has not had a recent decline in their functional status. Recommendations Recommendations for follow up therapy are one component  is no hydronephrosis on either side. There is symmetric enhancement and excretion of contrast by both kidneys. The visualized ureters and urinary bladder appear unremarkable. Stomach/Bowel: There is sigmoid diverticulosis with muscular hypertrophy. No active inflammatory changes. There is no bowel obstruction or active inflammation. The appendix is normal. Vascular/Lymphatic: Advanced aortoiliac atherosclerotic disease. The aorta is ectatic measuring 2.6 cm in maximal diameter. The IVC is unremarkable. No portal venous gas. There is no adenopathy. Reproductive: The uterus is retroflexed.  No adnexal masses. Other: None Musculoskeletal: Osteopenia with degenerative changes of the spine. No acute osseous pathology. IMPRESSION: 1. No acute intra-abdominal or pelvic pathology. 2. Cirrhosis. 3. Sigmoid diverticulosis. No bowel obstruction. Normal appendix. 4. Pulmonary opacities, worsened since the CT of 02/06/2021. 5.  Aortic Atherosclerosis (ICD10-I70.0). Electronically Signed   By: Tamara [name] M.D.   On: 10/17/2022 19:33   DG Chest Port 1 View  Result Date:  09/28/2022 CLINICAL DATA:  Shortness of breath, COVID positive EXAM: PORTABLE CHEST - 1 VIEW COMPARISON:  09/23/2022 FINDINGS: Persistent bibasilar interstitial and airspace opacities left greater than right. Worsening right upper lobe opacities. Heart size and mediastinal contours are within normal limits. Aortic Atherosclerosis (ICD10-170.0). No effusion. Visualized bones unremarkable. IMPRESSION: Persistent bibasilar and worsening right upper lobe opacities. Electronically Signed   By: Tamara [name] M.D.   On: 09/28/2022 14:18   DG Swallowing Func-Speech Pathology  Result Date: 09/27/2022 Table formatting from the original result was not included. Modified Barium Swallow Study Patient Details Name: Tamara [name] MRN: [id number] Date of Birth: Aug 22, 1945 Today's Date: 09/27/2022 HPI/PMH: HPI: 77 y.o. female presented at [facility], ED with complaining of fever, worsening of shortness of breath and productive cough. +COVID pna,  Past medical history of HTN, HLD, IDDM T2, COPD, hypothyroid, OSA, GERD, cavitary lung mass Clinical Impression: Clinical Impression: Pt demonstrated normal oropharyngeal swallow function. Possible esophageal dysphagia is suspected. She coughed once prior to po's likely from Covid diagnosis. Her tongue control, bolus preparation/mastication and transport were all within functional limits. Swallows were initiated timely with adequate strength to prevent aspiration. During pill consumption there was one trace, flash penetration of thin liquid that exited vestibule during the swallow. All boluses moved through her pharynx into upper esophageal sphincter without any residual. The pill stopped at the GE junction and did not move into the stomach given additional thin liquid. There was mild, possibly moderate residue seen in her esophagus at end of study which may account for her complaint of coughing at the end of meals. Recommend continue regular texture, thin liquids, pills with thin, stay  sitting upright after meals, alternate liquids and solids and therapist educated pt on strategies. Pt states she sleeps in a reclined position at home due to her reflux. No further ST warranted at this time. Factors that may increase risk of adverse event in presence of aspiration [name] [employer] [name] 2021): No data recorded Recommendations/Plan: Swallowing Evaluation Recommendations Swallowing Evaluation Recommendations Recommendations: PO diet PO Diet Recommendation: Regular; Thin liquids (Level 0) Liquid Administration via: Cup; Straw Medication Administration: Whole meds with liquid Supervision: Patient able to self-feed Swallowing strategies  : Small bites/sips; Slow rate Postural changes: Stay upright 30-60 min after meals; Position pt fully upright for meals Oral care recommendations: Oral care BID (2x/day) Treatment Plan Treatment Plan Treatment recommendations: No treatment recommended at this time Follow-up recommendations: No SLP follow up Functional status assessment: Patient has not had a recent decline in their functional status. Recommendations Recommendations for follow up therapy are one component  Physician Discharge Summary   VASHON ARCH  female DOB: 11-29-1945  OZD:[id number]  PCP: Tamara Mail, DO  Admit date: 10/17/2022 Discharge date: 10/18/2022  Admitted From: home Disposition:  home Husband updated at bedside prior to discharge.  CODE STATUS: Full code  Discharge Instructions     Diet Carb Modified   Complete by: As directed    Discharge instructions   Complete by: As directed    CT scan of your abdomen was normal.  You upper abdominal pain may be due to inflammation of your stomach or small intestine.  ED doctor had prescribed you Pepcid and Carafate to help with the pain.  Continue to take your home protonix.  I have prescribed Zofran for your nausea.    It does not appear that you have a new pneumonia.  You are already on 2 liters of oxygen at home which you should continue.  You have iron deficiency anemia.  Please take any over-the-counter iron supplement. [facility] Course:  For full details, please see H&P, progress notes, consult notes and ancillary notes.  Briefly,  Tamara [name] is a 77 y.o. 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  Abdominal pain possibly due to gastritis History of gastritis on EGD 2019 Patient complains of 3 days of severe epigastric pain associated with nausea.  States she is unable to eat any solids and could only tolerate liquids Lipase and LFTs WNL. CT abdomen and pelvis and RUQ ultrasound unremarkable. --pt is on home PPI however does not take consistently. --after admission, pt was tolerating oral intake and was vague about  the state of her abdominal pain.  Pt reported she was ready to be discharged.   --cont home PPI.  D/c home Mobic. --Pepcid, Reglan and Carafate Rx by ED provider.  Chronic Anemia Iron def --Hgb noted to be gradually trending down for the past year, from 10's down to 8's.   --anemia workup showed iron def.  Pt was started on oral iron supplement.  * Multifocal pneumonia, ruled out Possible sepsis, ruled out --CTA chest showed "Diffuse bilateral pulmonary infiltrates with areas of consolidation, airspace disease, interstitial disease. Similar pattern to previous study."  Pt was just treated for PNA about 2 weeks ago, unlikely to have developed a new PNA.  Pt did not have worsening respiratory status, and pt did not have new hypoxia as pt was still on home 2L O2 (at recent pulm f/u on 10/13/22, rec was to continue home O2).  Abx started on presentation, but were not continued.  Chronic respiratory failure with hypoxia on 2L O2 Acute hypoxemic respiratory failure ruled out COPD exacerbation, ruled out --pt is deconditioned and dyspneic at baseline.  Pt did not have worsening respiratory status, and pt did not have new hypoxia as pt was still on home 2L O2 (at recent pulm f/u on 10/13/22, rec was to continue home O2).   --cont home bronchodilator   OSA (obstructive sleep apnea) Continue CPAP   Coronary artery disease involving native heart Continue aspirin and rosuvastatin   Hepatic cirrhosis (HCC) Not decompensated --cont home lasix  Physician Discharge Summary   VASHON ARCH  female DOB: 11-29-1945  OZD:[id number]  PCP: Tamara Mail, DO  Admit date: 10/17/2022 Discharge date: 10/18/2022  Admitted From: home Disposition:  home Husband updated at bedside prior to discharge.  CODE STATUS: Full code  Discharge Instructions     Diet Carb Modified   Complete by: As directed    Discharge instructions   Complete by: As directed    CT scan of your abdomen was normal.  You upper abdominal pain may be due to inflammation of your stomach or small intestine.  ED doctor had prescribed you Pepcid and Carafate to help with the pain.  Continue to take your home protonix.  I have prescribed Zofran for your nausea.    It does not appear that you have a new pneumonia.  You are already on 2 liters of oxygen at home which you should continue.  You have iron deficiency anemia.  Please take any over-the-counter iron supplement. [facility] Course:  For full details, please see H&P, progress notes, consult notes and ancillary notes.  Briefly,  Tamara [name] is a 77 y.o. 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  Abdominal pain possibly due to gastritis History of gastritis on EGD 2019 Patient complains of 3 days of severe epigastric pain associated with nausea.  States she is unable to eat any solids and could only tolerate liquids Lipase and LFTs WNL. CT abdomen and pelvis and RUQ ultrasound unremarkable. --pt is on home PPI however does not take consistently. --after admission, pt was tolerating oral intake and was vague about  the state of her abdominal pain.  Pt reported she was ready to be discharged.   --cont home PPI.  D/c home Mobic. --Pepcid, Reglan and Carafate Rx by ED provider.  Chronic Anemia Iron def --Hgb noted to be gradually trending down for the past year, from 10's down to 8's.   --anemia workup showed iron def.  Pt was started on oral iron supplement.  * Multifocal pneumonia, ruled out Possible sepsis, ruled out --CTA chest showed "Diffuse bilateral pulmonary infiltrates with areas of consolidation, airspace disease, interstitial disease. Similar pattern to previous study."  Pt was just treated for PNA about 2 weeks ago, unlikely to have developed a new PNA.  Pt did not have worsening respiratory status, and pt did not have new hypoxia as pt was still on home 2L O2 (at recent pulm f/u on 10/13/22, rec was to continue home O2).  Abx started on presentation, but were not continued.  Chronic respiratory failure with hypoxia on 2L O2 Acute hypoxemic respiratory failure ruled out COPD exacerbation, ruled out --pt is deconditioned and dyspneic at baseline.  Pt did not have worsening respiratory status, and pt did not have new hypoxia as pt was still on home 2L O2 (at recent pulm f/u on 10/13/22, rec was to continue home O2).   --cont home bronchodilator   OSA (obstructive sleep apnea) Continue CPAP   Coronary artery disease involving native heart Continue aspirin and rosuvastatin   Hepatic cirrhosis (HCC) Not decompensated --cont home lasix

## 2022-10-18 NOTE — Progress Notes (Signed)
CODE SEPSIS - PHARMACY COMMUNICATION  **Broad Spectrum Antibiotics should be administered within 1 hour of Sepsis diagnosis**  Time Code Sepsis Called/Page Received: 9/30 @ 0316  Antibiotics Ordered: Vanc, Cefepime   Time of 1st antibiotic administration: Vancomycin 1 gm IV X 1 on 9/30 @ 0447  Additional action taken by pharmacy: messaged RN around [phone] to remind to give abx   If necessary, Name of Provider/Nurse Contacted:     Tamara [name] ,PharmD Clinical Pharmacist  10/18/2022  6:11 AM

## 2022-10-18 NOTE — Assessment & Plan Note (Signed)
Continue CPAP.  

## 2022-10-18 NOTE — Assessment & Plan Note (Signed)
Continue basal insulin with sliding scale coverage

## 2022-10-18 NOTE — ED Notes (Signed)
Pt A&O x4, no obvious distress noted, respirations regular/unlabored. Pt verbalizes understanding of discharge instructions. Pt wheeled from ED without incident.

## 2022-10-18 NOTE — Assessment & Plan Note (Addendum)
     Latest Ref Rng & Units 10/18/2022    4:01 AM 10/17/2022    4:50 PM 09/29/2022    6:21 AM  CBC  WBC 4.0 - 10.5 K/uL 5.3  5.0  5.2   Hemoglobin 12.0 - 15.0 g/dL 7.7  9.0  8.7   Hematocrit 36.0 - 46.0 % 24.8  28.3  27.1   Platelets 150 - 400 K/uL 262  329  185

## 2022-10-18 NOTE — Assessment & Plan Note (Addendum)
Possible sepsis Recent admission for sepsis secondary to COVID 9/5 to 09/30/2022 Left lower lobe cavitary mass Acute on chronic respiratory failure with hypoxia COPD exacerbation Patient tachycardic, tachypneic and hypoxic, normal WBC and lactic acid.  Follow procalcitonin CTA chest negative for PE and showing multifocal pneumonia Cefepime and vancomycin for possible HCAP given recent hospitalization Schedule an as needed DuoNebs Will hold off on steroids in the setting of acute infection Supplemental oxygen to keep sats over 91% Antitussives, incentive spirometer, flutter valve

## 2022-10-18 NOTE — Progress Notes (Signed)
Pharmacy Antibiotic Note  Tamara [name] is a 77 y.o. female admitted on 10/17/2022 with pneumonia.  Pharmacy has been consulted for Vanc, cefepime dosing.  Plan: Cefepime 2 gm IV Q12H ordered to start on 9/30 @ 0430.  Vancomycin 1 gm IV X 1 followed by Vanc 1500 mg IV X 1 ordered for 9/30 @ ~ 0500 to make total loading dose of 2500 mg.  Vancomycin 1 gm IV Q24H ordered to start on 10/1 @ 0500.  AUC = 454.4 Vanc trough = 11.3     Height: 5\' 6"  (167.6 cm) Weight: 102.5 kg (226 lb) IBW/kg (Calculated) : 59.3  Temp (24hrs), Avg:98.2 F (36.8 C), Min:97.8 F (36.6 C), Max:98.6 F (37 C)  Recent Labs  Lab 10/17/22 1650 10/17/22 1854 10/18/22 0401  WBC 5.0  --  5.3  CREATININE 0.98  --   --   LATICACIDVEN 1.8 1.6  --     Estimated Creatinine Clearance: 60 mL/min (by C-G formula based on SCr of 0.98 mg/dL).    Allergies  Allergen Reactions   Penicillins Hives    Tolerated rocephin 09/2022 Reaction: 1965    Antimicrobials this admission:   >>    >>   Dose adjustments this admission:   Microbiology results:  BCx:   UCx:    Sputum:    MRSA PCR:   Thank you for allowing pharmacy to be a part of this patient's care.  Jocelyn Nold D 10/18/2022 4:31 AM

## 2022-10-18 NOTE — ED Provider Notes (Signed)
----------------------------------------- 2:30 AM on 10/18/2022 -----------------------------------------  Assumed care from Dr. Scotty Court.  In short, Tamara [name] is a 77 y.o. female with a chief complaint of abdominal pain.  Refer to the original H&P for additional details.  The patient had a reassuring workup including no leukocytosis, normal lactic acid, and no acute abnormalities on CT of the abdomen and pelvis.  Dr. Scotty Court had followed up with her and plan for discharge.  The patient's nurse informed me that when he tried to discharge her, he noticed that her heart rate was up to the 130s and when she stood up she became hypoxic, with her SpO2 dropping down to 84%.  I was informed about this issue so we placed the patient on supplementary oxygen and back on the cardiac monitor.  I ordered 3 DuoNeb's which she said helped her feel little bit better but she was still requiring oxygen.  Even after the albuterol wore off, she was still tachycardic in the 120s.  She has already received 2 L of IV fluids through the course of Dr. Charmian Muff care and treatment.  I obtained a chest x-ray which I viewed and interpreted and there was some opacities that were consistent with prior but unclear whether this could be fluid or infection.  I ordered a CTA chest to rule out pulmonary embolism as well as to further evaluate the lung parenchyma.  I am concerned about the possibility of developing infection, likely a bacterial pneumonia in the setting of her relatively recent COVID-19 diagnosis.  Patient is stable but unlikely to be discharged given her acute respiratory failure with hypoxia.  She has been on oxygen in the past for her COPD but reports that her pulmonologist took her off of the oxygen because he said she did not need it any longer.   Clinical Course as of 10/18/22 0352  Mon Oct 18, 2022  2725 CT Angio Chest PE W/Cm &/Or Wo Cm I viewed and interpreted the patient's CTA chest and it is  worrisome for multifocal pneumonia, which the radiologist also mentioned. [CF]  [phone] Given the patient's persistent tachycardia and hypoxia, I am treating empirically for multifocal pneumonia with ceftriaxone 2 g IV and azithromycin 500 mg IV.  Of note, she does not have an elevated lactic acid and does not meet criteria for severe sepsis, but given that she meets SIRS criteria and has a source, I will make her a code sepsis.  I am consulting the hospitalist for admission. [CF]  [id number] Of note, I am treating with IV fluids for ideal body weight, which gives a goal of 1.8 L IV.  She has already received 2 L of crystalloid prior to me taking over care for her.  Given that she is still tachycardic, I am ordering 1/3 L of fluids, but since her ideal body weight places are well below this goal, she is receiving plenty of fluids to meet the sepsis requirements. [CF]  [id number] Consulted with Dr. Para March with the hospitalist service who will admit the patient [CF]    Clinical Course User Index [CF] Loleta Rose, MD   .1-3 Lead EKG Interpretation  Performed by: Loleta Rose, MD Authorized by: Loleta Rose, MD     Interpretation: abnormal     ECG rate:  122   ECG rate assessment: tachycardic     Rhythm: sinus tachycardia     Ectopy: none     Conduction: normal   .Critical Care  Performed by: Loleta Rose, MD Authorized by: York Cerise,  Kandee Keen, MD   Critical care provider statement:    Critical care time (minutes):  45   Critical care time was exclusive of:  Separately billable procedures and treating other patients   Critical care was necessary to treat or prevent imminent or life-threatening deterioration of the following conditions:  Respiratory failure and sepsis   Critical care was time spent personally by me on the following activities:  Development of treatment plan with patient or surrogate, evaluation of patient's response to treatment, examination of patient, obtaining history from patient or  surrogate, ordering and performing treatments and interventions, ordering and review of laboratory studies, ordering and review of radiographic studies, pulse oximetry, re-evaluation of patient's condition and review of old charts     Medications  methylPREDNISolone sodium succinate (SOLU-MEDROL) 125 mg/2 mL injection 125 mg (has no administration in time range)  lactated ringers bolus 1,000 mL (has no administration in time range)  cefTRIAXone (ROCEPHIN) 2 g in sodium chloride 0.9 % 100 mL IVPB (has no administration in time range)  azithromycin (ZITHROMAX) 500 mg in sodium chloride 0.9 % 250 mL IVPB (has no administration in time range)  aspirin EC tablet 81 mg (has no administration in time range)  ezetimibe (ZETIA) tablet 10 mg (has no administration in time range)  furosemide (LASIX) tablet 40 mg (has no administration in time range)  rosuvastatin (CRESTOR) tablet 20 mg (has no administration in time range)  irbesartan (AVAPRO) tablet 75 mg (has no administration in time range)  dapagliflozin propanediol (FARXIGA) tablet 5 mg (has no administration in time range)  levothyroxine (SYNTHROID) tablet 50 mcg (has no administration in time range)  pantoprazole (PROTONIX) EC tablet 40 mg (has no administration in time range)  lactated ringers infusion (has no administration in time range)  enoxaparin (LOVENOX) injection 40 mg (has no administration in time range)  vancomycin (VANCOCIN) IVPB 1000 mg/200 mL premix (has no administration in time range)  ceFEPIme (MAXIPIME) 2 g in sodium chloride 0.9 % 100 mL IVPB (has no administration in time range)  acetaminophen (TYLENOL) tablet 650 mg (has no administration in time range)    Or  acetaminophen (TYLENOL) suppository 650 mg (has no administration in time range)  ondansetron (ZOFRAN) tablet 4 mg (has no administration in time range)    Or  ondansetron (ZOFRAN) injection 4 mg (has no administration in time range)  sodium chloride 0.9 % bolus  1,000 mL (0 mLs Intravenous Stopped 10/17/22 2101)  ondansetron (ZOFRAN) injection 4 mg (4 mg Intravenous Given 10/17/22 1852)  morphine (PF) 4 MG/ML injection 4 mg (4 mg Intravenous Given 10/17/22 1852)  iohexol (OMNIPAQUE) 300 MG/ML solution 100 mL (100 mLs Intravenous Contrast Given 10/17/22 1912)  lactated ringers bolus 1,000 mL (0 mLs Intravenous Stopped 10/18/22 0128)  ipratropium-albuterol (DUONEB) 0.5-2.5 (3) MG/3ML nebulizer solution 3 mL (3 mLs Nebulization Given 10/18/22 0122)  ipratropium-albuterol (DUONEB) 0.5-2.5 (3) MG/3ML nebulizer solution 3 mL (3 mLs Nebulization Given 10/18/22 0122)  ipratropium-albuterol (DUONEB) 0.5-2.5 (3) MG/3ML nebulizer solution 3 mL (3 mLs Nebulization Given 10/18/22 0121)  iohexol (OMNIPAQUE) 350 MG/ML injection 60 mL (60 mLs Intravenous Contrast Given 10/18/22 0244)     ED Discharge Orders          Ordered    sucralfate (CARAFATE) 1 g tablet  4 times daily        10/18/22 0003    famotidine (PEPCID) 20 MG tablet  2 times daily        10/18/22 0003    metoCLOPramide (REGLAN)  10 MG tablet  Every 6 hours PRN        10/18/22 0003           Final diagnoses:  Pain of upper abdomen  Acute respiratory failure with hypoxia (HCC)  Sepsis, due to unspecified organism, unspecified whether acute organ dysfunction present [facility])  Multifocal pneumonia     Loleta Rose, MD 10/18/22 [phone]

## 2022-10-18 NOTE — Sepsis Progress Note (Signed)
Elink following code sepsis °

## 2022-10-18 NOTE — H&P (Addendum)
History and Physical    Patient: Tamara [name] WUJ:[id number] DOB: 1945/07/27 DOA: 10/17/2022 DOS: the patient was seen and examined on 10/18/2022 PCP: Margarita Mail, DO  Patient coming from: Home  Chief Complaint:  Chief Complaint  Patient presents with   Abdominal Pain    HPI: Tamara [name] is a 77 y.o. female with medical history significant for HTN, , DM-2, COPD, OSA, LLL cavitary mass followed by PCCM pending outpatient PET scan recently hospitalized from 9/5 to 09/30/2022 with severe sepsis with acute respiratory failure secondary to COVID, treated with Rocephin and azithromycin and Decadron and discharged on home O2(discontinued by her PCP a week later), who presents to the ED initially with upper abdominal pain, and nausea and is now being admitted with pneumonia and hypoxia..  She initially underwent a workup in the ED for abdominal pain Labs were at baseline,: Normal WBC and lactic acid, lipase and LFTs WNL and urinalysis unremarkable. She had a CT abdomen and pelvis that showed no acute findings as well as right upper quadrant ultrasound that was unremarkable. As patient was getting prepared for discharge she became tachycardic and tachypneic and hypoxic to 91 on room air requiring 2 L and subsequently underwent CTA chest that was negative for PE but showed multifocal pneumonia. She was treated with repeated DuoNebs started on sepsis fluids as well as ceftriaxone and azithromycin Hospitalist consulted for admission.  On my evaluation of the patient, she is tachypneic but her main complaint is epigastric pain which she states has been ongoing for the past 2 weeks since her hospitalization, acutely worsening 3 days prior.  She states she is unable to tolerate eating any solid foods and can barely take liquids.  Has nausea without vomiting.  No black or bloody stools.   Review of Systems: As mentioned in the history of present illness. All other systems reviewed and are  negative.  Past Medical History:  Diagnosis Date   Arthritis    knee and shoulders   Colon polyps 09/05/2017   COPD (chronic obstructive pulmonary disease) (HCC)    Diabetes mellitus without complication (HCC)    type II   Dyspnea    05/16/20 has had a cough for 2.5 years post Covid   Gastritis 09/05/2017   GERD (gastroesophageal reflux disease)    Hyperlipidemia    Hypertension    patient denies   Hypothyroidism    Panic attack    RUQ pain 03/09/2017   Per patient, she has had RUQ pain 4-5 years that has grown worse in the last few months.   Past Surgical History:  Procedure Laterality Date   BREAST BIOPSY Left 2008   CORE W/CLIP - NEG   BRONCHIAL BIOPSY  05/19/2020   Procedure: BRONCHIAL BIOPSIES;  Surgeon: Leslye Peer, MD;  Location: [facility] ENDOSCOPY;  Service: Pulmonary;;   BRONCHIAL BRUSHINGS  05/19/2020   Procedure: BRONCHIAL BRUSHINGS;  Surgeon: Leslye Peer, MD;  Location: [facility] ENDOSCOPY;  Service: Pulmonary;;   BRONCHIAL NEEDLE ASPIRATION BIOPSY  05/19/2020   Procedure: BRONCHIAL NEEDLE ASPIRATION BIOPSIES;  Surgeon: Leslye Peer, MD;  Location: [facility] ENDOSCOPY;  Service: Pulmonary;;   BRONCHIAL WASHINGS  05/19/2020   Procedure: BRONCHIAL WASHINGS;  Surgeon: Leslye Peer, MD;  Location: [facility] ENDOSCOPY;  Service: Pulmonary;;   COLONOSCOPY WITH PROPOFOL N/A 02/08/2017   Procedure: COLONOSCOPY WITH PROPOFOL;  Surgeon: Christena Deem, MD;  Location: [facility] ENDOSCOPY;  Service: Endoscopy;  Laterality: N/A;   COLONOSCOPY WITH PROPOFOL N/A 05/05/2022   Procedure:  COLONOSCOPY WITH PROPOFOL;  Surgeon: Toney Reil, MD;  Location: [facility] ENDOSCOPY;  Service: Gastroenterology;  Laterality: N/A;   ESOPHAGOGASTRODUODENOSCOPY (EGD) WITH PROPOFOL N/A 02/08/2017   Procedure: ESOPHAGOGASTRODUODENOSCOPY (EGD) WITH PROPOFOL;  Surgeon: Christena Deem, MD;  Location: [facility] ENDOSCOPY;  Service: Endoscopy;  Laterality: N/A;   TUBAL LIGATION     VIDEO BRONCHOSCOPY WITH ENDOBRONCHIAL  NAVIGATION N/A 05/19/2020   Procedure: VIDEO BRONCHOSCOPY WITH ENDOBRONCHIAL NAVIGATION;  Surgeon: Leslye Peer, MD;  Location: [facility] ENDOSCOPY;  Service: Pulmonary;  Laterality: N/A;   Social History:  reports that she quit smoking about 18 years ago. Her smoking use included cigarettes. She started smoking about 58 years ago. She has a 40 pack-year smoking history. She has never used smokeless tobacco. She reports that she does not drink alcohol and does not use drugs.  Allergies  Allergen Reactions   Penicillins Hives    Tolerated rocephin 09/2022 Reaction: 1965    Family History  Problem Relation Age of Onset   Diabetes Maternal Grandmother    Breast cancer Neg Hx     Prior to Admission medications   Medication Sig Start Date End Date Taking? Authorizing Provider  famotidine (PEPCID) 20 MG tablet Take 1 tablet (20 mg total) by mouth 2 (two) times daily. 10/18/22  Yes Sharman Cheek, MD  metoCLOPramide (REGLAN) 10 MG tablet Take 1 tablet (10 mg total) by mouth every 6 (six) hours as needed. 10/18/22  Yes Sharman Cheek, MD  sucralfate (CARAFATE) 1 g tablet Take 1 tablet (1 g total) by mouth 4 (four) times daily. 10/18/22  Yes Sharman Cheek, MD  Accu-Chek Softclix Lancets lancets TEST BLOOD SUGAR TWO TIMES DAILY 07/28/22   Margarita Mail, DO  albuterol (PROVENTIL) (2.5 MG/3ML) 0.083% nebulizer solution Take 3 mLs (2.5 mg total) by nebulization every 6 (six) hours as needed for wheezing or shortness of breath. 03/01/22 03/01/23  Margarita Mail, DO  albuterol (VENTOLIN HFA) 108 (90 Base) MCG/ACT inhaler Inhale 2 puffs into the lungs every 6 (six) hours as needed for wheezing or shortness of breath. 09/17/20   Leslye Peer, MD  Alcohol Swabs (DROPSAFE ALCOHOL PREP) 70 % PADS USE AS DIRECTED TWICE DAILY WHEN TESTING BLOOD SUGAR 06/27/22   Carlus Pavlov, MD  Ascorbic Acid (VITAMIN C) 1000 MG tablet Take 1,000 mg by mouth daily.    [provider]  aspirin EC 81 MG  tablet Take 1 tablet (81 mg total) by mouth daily. Swallow whole. 05/18/22   Meriam Sprague, MD  Cholecalciferol (VITAMIN D) 50 MCG (2000 UT) CAPS Take 1,000 Units by mouth daily.    [provider]  dapagliflozin propanediol (FARXIGA) 5 MG TABS tablet Take 1 tablet (5 mg total) by mouth daily before breakfast. 11/27/21   Carlus Pavlov, MD  DROPLET PEN NEEDLES 32G X 4 MM MISC 1 Device by Other route daily. 06/10/22   Carlus Pavlov, MD  ezetimibe (ZETIA) 10 MG tablet Take 1 tablet (10 mg total) by mouth daily. 02/16/22   Flossie Dibble, NP  furosemide (LASIX) 40 MG tablet Take 1 tablet (40 mg total) by mouth daily. 09/30/22   Ghimire, Werner Lean, MD  gabapentin (NEURONTIN) 300 MG capsule Take 300 mg by mouth as needed. 10/16/21   [provider]  Insulin NPH, Human,, Isophane, (NOVOLIN N FLEXPEN) 100 UNIT/ML Kiwkpen 36 units in am and 28-30 units at bedtime 07/05/22   Carlus Pavlov, MD  levothyroxine (SYNTHROID) 50 MCG tablet TAKE 1 TABLET EVERY DAY BEFORE BREAKFAST 07/26/22  Margarita Mail, DO  meloxicam (MOBIC) 15 MG tablet TAKE 1/2 TO 1 TABLET EVERY DAY AS NEEDED FOR PAIN 09/09/22   Margarita Mail, DO  metFORMIN (GLUCOPHAGE) 1000 MG tablet Take 1 tablet (1,000 mg total) by mouth 2 (two) times daily. 06/10/22   Carlus Pavlov, MD  nystatin (MYCOSTATIN/NYSTOP) powder Apply 1 Application topically daily as needed (yeast under belly). 03/01/22   Margarita Mail, DO  pantoprazole (PROTONIX) 40 MG tablet TAKE 1 TABLET TWICE DAILY 10/06/22   Leslye Peer, MD  potassium chloride (KLOR-CON M) 10 MEQ tablet Take 1 tablet (10 mEq total) by mouth daily. 05/18/22   Meriam Sprague, MD  rosuvastatin (CRESTOR) 20 MG tablet TAKE 1 TABLET AT BEDTIME 07/26/22   Margarita Mail, DO  Tiotropium Bromide-Olodaterol (STIOLTO RESPIMAT) 2.5-2.5 MCG/ACT AERS Inhale 2 puffs into the lungs daily. 09/08/22   Leslye Peer, MD  TRUE METRIX BLOOD GLUCOSE TEST test strip Use 2x a  day 06/10/22   Carlus Pavlov, MD  valsartan (DIOVAN) 80 MG tablet TAKE 1 TABLET (80 MG TOTAL) BY MOUTH DAILY. 05/12/22   Berniece Salines, FNP    Physical Exam: Vitals:   10/18/22 0105 10/18/22 0130 10/18/22 0142 10/18/22 0200  BP: (!) 106/53   (!) 118/55  Pulse: (!) 118 (!) 116  (!) 119  Resp: (!) 36   (!) 23  Temp:   98.6 F (37 C)   TempSrc:   Oral   SpO2: 95% 100%  93%  Weight:      Height:       Physical Exam Vitals and nursing note reviewed.  Constitutional:      General: She is not in acute distress.    Comments: Conversational dyspnea  HENT:     Head: Normocephalic and atraumatic.  Cardiovascular:     Rate and Rhythm: Regular rhythm. Tachycardia present.     Heart sounds: Normal heart sounds.  Pulmonary:     Effort: Tachypnea present.     Breath sounds: Normal breath sounds.  Abdominal:     Palpations: Abdomen is soft.     Tenderness: There is abdominal tenderness in the epigastric area.  Neurological:     Mental Status: Mental status is at baseline.     Labs on Admission: I have personally reviewed following labs and imaging studies  CBC: Recent Labs  Lab 10/17/22 1650  WBC 5.0  HGB 9.0*  HCT 28.3*  MCV 78.2*  PLT 329   Basic Metabolic Panel: Recent Labs  Lab 10/17/22 1650  NA 134*  K 3.5  CL 100  CO2 23  GLUCOSE 150*  BUN 8  CREATININE 0.98  CALCIUM 8.0*   GFR: Estimated Creatinine Clearance: 60 mL/min (by C-G formula based on SCr of 0.98 mg/dL). Liver Function Tests: Recent Labs  Lab 10/17/22 1650  AST 20  ALT 12  ALKPHOS 55  BILITOT 0.6  PROT 7.4  ALBUMIN 3.0*   Recent Labs  Lab 10/17/22 1650  LIPASE 35   No results for input(s): "AMMONIA" in the last 168 hours. Coagulation Profile: No results for input(s): "INR", "PROTIME" in the last 168 hours. Cardiac Enzymes: No results for input(s): "CKTOTAL", "CKMB", "CKMBINDEX", "TROPONINI" in the last 168 hours. BNP (last 3 results) No results for input(s): "PROBNP" in the  last 8760 hours. HbA1C: No results for input(s): "HGBA1C" in the last 72 hours. CBG: Recent Labs  Lab 10/11/22 0730 10/17/22 2305  GLUCAP 155* 113*   Lipid Profile: No results for input(s): "CHOL", "HDL", "LDLCALC", "  TRIG", "CHOLHDL", "LDLDIRECT" in the last 72 hours. Thyroid Function Tests: No results for input(s): "TSH", "T4TOTAL", "FREET4", "T3FREE", "THYROIDAB" in the last 72 hours. Anemia Panel: No results for input(s): "VITAMINB12", "FOLATE", "FERRITIN", "TIBC", "IRON", "RETICCTPCT" in the last 72 hours. Urine analysis:    Component Value Date/Time   COLORURINE STRAW (A) 10/17/2022 2102   APPEARANCEUR CLEAR (A) 10/17/2022 2102   LABSPEC 1.016 10/17/2022 2102   PHURINE 7.0 10/17/2022 2102   GLUCOSEU NEGATIVE 10/17/2022 2102   HGBUR NEGATIVE 10/17/2022 2102   BILIRUBINUR NEGATIVE 10/17/2022 2102   KETONESUR NEGATIVE 10/17/2022 2102   PROTEINUR NEGATIVE 10/17/2022 2102   NITRITE NEGATIVE 10/17/2022 2102   LEUKOCYTESUR NEGATIVE 10/17/2022 2102    Radiological Exams on Admission: CT Angio Chest PE W/Cm &/Or Wo Cm  Result Date: 10/18/2022 CLINICAL DATA:  Pulmonary embolus suspected with high probability. EXAM: CT ANGIOGRAPHY CHEST WITH CONTRAST TECHNIQUE: Multidetector CT imaging of the chest was performed using the standard protocol during bolus administration of intravenous contrast. Multiplanar CT image reconstructions and MIPs were obtained to evaluate the vascular anatomy. RADIATION DOSE REDUCTION: This exam was performed according to the departmental dose-optimization program which includes automated exposure control, adjustment of the mA and/or kV according to patient size and/or use of iterative reconstruction technique. CONTRAST:  60mL OMNIPAQUE IOHEXOL 350 MG/ML SOLN COMPARISON:  09/23/2022 FINDINGS: Cardiovascular: Good opacification of the central and segmental pulmonary arteries. No focal filling defects. No evidence of significant pulmonary embolus. Cardiac  enlargement. No pericardial effusions. Normal caliber thoracic aorta. No aortic dissection. Calcification of the aorta and coronary arteries. Mediastinum/Nodes: Thyroid gland is unremarkable. Esophagus is decompressed. Scattered lymph nodes are not pathologically enlarged. Lungs/Pleura: Patchy but diffuse distribution of areas of consolidation and interstitial and alveolar infiltrates throughout both lungs. Changes are likely multifocal pneumonia. Pattern distribution is similar to prior study. No pleural effusions. No pneumothorax. Upper Abdomen: Hepatic cirrhosis with nodular contour to the liver. 1.8 cm diameter circumscribed low-attenuation lesion in the lateral segment left lobe is unchanged since previous study. Appearances most consistent with a cyst. No imaging follow-up is indicated. Spleen is enlarged. Upper abdominal varices. 1.9 cm diameter right adrenal gland nodule with density measurements of 38 Hounsfield units. No change since prior study dating back to 04/16/2020. Musculoskeletal: No chest wall abnormality. No acute or significant osseous findings. Review of the MIP images confirms the above findings. IMPRESSION: 1. No evidence of significant pulmonary embolus. 2. Diffuse bilateral pulmonary infiltrates with areas of consolidation, airspace disease, interstitial disease. Similar pattern to previous study. This likely represents multifocal pneumonia. 3. Hepatic cirrhosis and splenic enlargement. 4. 1.9 cm right adrenal gland nodule. No change since 04/16/2020. Two year stability suggests benign etiology. No imaging follow-up is indicated. 5. Cardiac enlargement. 6. Aortic atherosclerosis. Electronically Signed   By: Burman Nieves M.D.   On: 10/18/2022 03:08   DG Chest 2 View  Result Date: 10/18/2022 CLINICAL DATA:  Acute shortness of breath with hypoxia EXAM: CHEST - 2 VIEW COMPARISON:  09/28/2022 FINDINGS: Cardiac shadow is stable. Increasing central vascular congestion is noted. Mild  interstitial edema is noted. Patchy airspace opacity is again identified. Bony structures are within normal limits. No sizable effusion is seen. IMPRESSION: Vascular congestion increasing interstitial edema. Patchy airspace opacities similar to that seen on the prior exam. Electronically Signed   By: Alcide Clever M.D.   On: 10/18/2022 02:04   US ABDOMEN LIMITED RUQ (LIVER/GB)  Result Date: 10/17/2022 CLINICAL DATA:  Right upper quadrant pain for 1 day EXAM: ULTRASOUND  ABDOMEN LIMITED RIGHT UPPER QUADRANT COMPARISON:  CT from earlier in the same day. FINDINGS: Gallbladder: No gallstones or wall thickening visualized. No sonographic Murphy sign noted by sonographer. Common bile duct: Diameter: 5.7 mm. Liver: Liver is somewhat nodular consistent with underlying cirrhotic change. This is stable from the prior CT examination. 1.7 cm cyst is noted in the left lobe of the liver stable from the prior CT. Portal vein is patent on color Doppler imaging with normal direction of blood flow towards the liver. Other: None. IMPRESSION: Hepatic cyst. Underlying cirrhotic change. Unremarkable gallbladder. Electronically Signed   By: Alcide Clever M.D.   On: 10/17/2022 23:26   CT ABDOMEN PELVIS W CONTRAST  Result Date: 10/17/2022 CLINICAL DATA:  Acute abdominal pain. Loss of appetite and diarrhea. Pulmonary nodules. EXAM: CT ABDOMEN AND PELVIS WITH CONTRAST TECHNIQUE: Multidetector CT imaging of the abdomen and pelvis was performed using the standard protocol following bolus administration of intravenous contrast. RADIATION DOSE REDUCTION: This exam was performed according to the departmental dose-optimization program which includes automated exposure control, adjustment of the mA and/or kV according to patient size and/or use of iterative reconstruction technique. CONTRAST:  OMNIPAQUE IOHEXOL 300 MG/ML  SOLN COMPARISON:  PET CT dated 10/11/2022. chest CT dated 02/06/2021. FINDINGS: Lower chest: Bilateral patchy airspace  and reticular opacities. Focal area of consolidation at the left lung base measures 5 x 6 cm, progressed since the chest CT of 02/06/2021. Findings may be inflammatory/infectious in etiology. Neoplasm is not excluded. Clinical correlation and follow-up recommended. There is mild bilateral lower lobe bronchiectasis. Coronary vascular calcification and calcification of the mitral annulus. No intra-abdominal free air or free fluid. Hepatobiliary: Cirrhosis. Small cyst in the left lobe of the liver. No biliary dilatation. The gallbladder is unremarkable. Pancreas: Unremarkable. No pancreatic ductal dilatation or surrounding inflammatory changes. Spleen: Normal in size without focal abnormality. Adrenals/Urinary Tract: The left adrenal gland is unremarkable. There is a 14 mm right adrenal indeterminate nodule. Small left renal upper pole cyst. There is no hydronephrosis on either side. There is symmetric enhancement and excretion of contrast by both kidneys. The visualized ureters and urinary bladder appear unremarkable. Stomach/Bowel: There is sigmoid diverticulosis with muscular hypertrophy. No active inflammatory changes. There is no bowel obstruction or active inflammation. The appendix is normal. Vascular/Lymphatic: Advanced aortoiliac atherosclerotic disease. The aorta is ectatic measuring 2.6 cm in maximal diameter. The IVC is unremarkable. No portal venous gas. There is no adenopathy. Reproductive: The uterus is retroflexed.  No adnexal masses. Other: None Musculoskeletal: Osteopenia with degenerative changes of the spine. No acute osseous pathology. IMPRESSION: 1. No acute intra-abdominal or pelvic pathology. 2. Cirrhosis. 3. Sigmoid diverticulosis. No bowel obstruction. Normal appendix. 4. Pulmonary opacities, worsened since the CT of 02/06/2021. 5.  Aortic Atherosclerosis (ICD10-I70.0). Electronically Signed   By: Elgie Collard M.D.   On: 10/17/2022 19:33     Data Reviewed: Relevant notes from primary  care and specialist visits, past discharge summaries as available in EHR, including Care Everywhere. Prior diagnostic testing as pertinent to current admission diagnoses Updated medications and problem lists for reconciliation ED course, including vitals, labs, imaging, treatment and response to treatment Triage notes, nursing and pharmacy notes and ED provider's notes Notable results as noted in HPI   Assessment and Plan: * Multifocal pneumonia Possible sepsis Recent admission for sepsis secondary to COVID 9/5 to 09/30/2022 Left lower lobe cavitary mass Acute on chronic respiratory failure with hypoxia COPD exacerbation Patient tachycardic, tachypneic and hypoxic, normal WBC and lactic  acid.  Follow procalcitonin CTA chest negative for PE and showing multifocal pneumonia Cefepime and vancomycin for possible HCAP given recent hospitalization Schedule an as needed DuoNebs Will hold off on steroids in the setting of acute infection Supplemental oxygen to keep sats over 91% Antitussives, incentive spirometer, flutter valve  Acute gastritis Possible acute blood loss anemia History of gastritis on EGD 2019 Patient complains of 3 days of severe epigastric pain associated with nausea.  States she is unable to eat any solids and could only tolerate liquids Lipase and LFTs WNL CT abdomen and pelvis and RUQ ultrasound unremarkable Following admission: CBC downtrending 9.0-->7.7--continue to trend H&H and transfuse if needed IV Protonix GI consult Will keep n.p.o.    Latest Ref Rng & Units 10/18/2022    4:01 AM 10/17/2022    4:50 PM 09/29/2022    6:21 AM  CBC  WBC 4.0 - 10.5 K/uL 5.3  5.0  5.2   Hemoglobin 12.0 - 15.0 g/dL 7.7  9.0  8.7   Hematocrit 36.0 - 46.0 % 24.8  28.3  27.1   Platelets 150 - 400 K/uL 262  329  185      OSA (obstructive sleep apnea) Continue CPAP  Coronary artery disease involving native heart Continue aspirin and rosuvastatin  Hepatic cirrhosis (HCC) Not  decompensated On lasix  Diabetes mellitus without complication (HCC) Continue basal insulin with sliding scale coverage  Hypertension Continue valsartan     DVT prophylaxis: SCD  Consults: none  Advance Care Planning:   Code Status: Prior   Family Communication: Husband at bedside  Disposition Plan: Back to previous home environment  Severity of Illness: The appropriate patient status for this patient is INPATIENT. Inpatient status is judged to be reasonable and necessary in order to provide the required intensity of service to ensure the patient's safety. The patient's presenting symptoms, physical exam findings, and initial radiographic and laboratory data in the context of their chronic comorbidities is felt to place them at high risk for further clinical deterioration. Furthermore, it is not anticipated that the patient will be medically stable for discharge from the hospital within 2 midnights of admission.   * I certify that at the point of admission it is my clinical judgment that the patient will require inpatient hospital care spanning beyond 2 midnights from the point of admission due to high intensity of service, high risk for further deterioration and high frequency of surveillance required.*  Author: Andris Baumann, MD 10/18/2022 3:35 AM  For on call review [email or web address].

## 2022-10-18 NOTE — Assessment & Plan Note (Addendum)
Not decompensated On lasix

## 2022-10-18 NOTE — Care Management Obs Status (Signed)
MEDICARE OBSERVATION STATUS NOTIFICATION   Patient Details  Name: Tamara [name] MRN: [id number] Date of Birth: 05/09/1945   Medicare Observation Status Notification Given:  Yes    Margarito Liner, LCSW 10/18/2022, 1:03 PM

## 2022-10-18 NOTE — Assessment & Plan Note (Signed)
Continue valsartan

## 2022-10-18 NOTE — ED Notes (Signed)
Arrived at Pt bedside and Pt had O2 and BP cuff off and asked this RN to turn off the alarms. This RN told Pt monitors, BP, O2 and IV need to stay on and I would not turn off/silence alarms for her safety and care.

## 2022-10-18 NOTE — Assessment & Plan Note (Addendum)
Possible acute blood loss anemia History of gastritis on EGD 2019 Patient complains of 3 days of severe epigastric pain associated with nausea.  States she is unable to eat any solids and could only tolerate liquids Lipase and LFTs WNL CT abdomen and pelvis and RUQ ultrasound unremarkable Following admission: CBC downtrending 9.0-->7.7--continue to trend H&H and transfuse if needed IV Protonix GI consult Will keep n.p.o.    Latest Ref Rng & Units 10/18/2022    4:01 AM 10/17/2022    4:50 PM 09/29/2022    6:21 AM  CBC  WBC 4.0 - 10.5 K/uL 5.3  5.0  5.2   Hemoglobin 12.0 - 15.0 g/dL 7.7  9.0  8.7   Hematocrit 36.0 - 46.0 % 24.8  28.3  27.1   Platelets 150 - 400 K/uL 262  329  185

## 2022-10-18 NOTE — Care Management CC44 (Signed)
Condition Code 44 Documentation Completed  Patient Details  Name: Tamara [name] MRN: [id number] Date of Birth: 1945-07-07   Condition Code 44 given:  Yes Patient signature on Condition Code 44 notice:  Yes Documentation of 2 MD's agreement:  Yes Code 44 added to claim:  Yes    Margarito Liner, LCSW 10/18/2022, 1:03 PM

## 2022-10-18 NOTE — Progress Notes (Signed)
Anticoagulation monitoring(Lovenox):  77 yo female ordered Lovenox 40 mg Q24h    Filed Weights   10/17/22 1644  Weight: 102.5 kg (226 lb)   BMI 36.5   Lab Results  Component Value Date   CREATININE 0.98 10/17/2022   CREATININE 1.13 (H) 09/29/2022   CREATININE 1.05 (H) 09/28/2022   Estimated Creatinine Clearance: 60 mL/min (by C-G formula based on SCr of 0.98 mg/dL). Hemoglobin & Hematocrit     Component Value Date/Time   HGB 9.0 (L) 10/17/2022 1650   HCT 28.3 (L) 10/17/2022 1650     Per Protocol for Patient with estCrcl > 30 ml/min and BMI > 30, will transition to Lovenox 52.5 mg Q24h.

## 2022-10-18 NOTE — Assessment & Plan Note (Signed)
Continue aspirin and rosuvastatin ?

## 2022-10-23 LAB — CULTURE, BLOOD (ROUTINE X 2)
Culture: NO GROWTH
Culture: NO GROWTH

## 2022-10-25 ENCOUNTER — Other Ambulatory Visit: Payer: Medicare HMO | Admitting: Pharmacist

## 2022-10-25 NOTE — Patient Instructions (Signed)
Goals Addressed             This Visit's Progress    Pharmacy Goals       Please contact [employer] about the COPD inhaler program at [phone]  Thank you!   Estelle Grumbles, PharmD, [facility] [phone]

## 2022-10-25 NOTE — Progress Notes (Signed)
10/25/2022  Patient ID: Tamara [name], female   DOB: 1945/03/28, 77 y.o.   MRN: [id number]  Outreach to patient today by telephone as scheduled to follow up regarding medication assistance.    Patient denies having yet contacted [facility] COPD inhaler program. Review with patient information regarding the [facility] COPD inhaler program and recommend patient contact program for enrollment/assistance with cost of her Stiolto (covered through program).   Plan:   Counsel patient on [employer] COPD inhaler program to obtain her maintenance inhaler for no cost through mail order. Advised patient to contact program at [phone] to enroll  Patient denies further medication questions or concerns today  Provide patient with contact information for clinical pharmacist to contact if needed in future for medication questions/concerns   Estelle Grumbles, PharmD, [facility] [phone]

## 2022-10-27 ENCOUNTER — Ambulatory Visit (INDEPENDENT_AMBULATORY_CARE_PROVIDER_SITE_OTHER): Payer: Medicare HMO | Admitting: Internal Medicine

## 2022-10-27 ENCOUNTER — Other Ambulatory Visit (HOSPITAL_COMMUNITY)
Admission: RE | Admit: 2022-10-27 | Discharge: 2022-10-27 | Disposition: A | Discharge status: Home / Self Care | Payer: Medicare HMO | Source: Ambulatory Visit | Attending: Internal Medicine | Admitting: Internal Medicine

## 2022-10-27 ENCOUNTER — Encounter: Payer: Self-pay | Admitting: Internal Medicine

## 2022-10-27 VITALS — BP 136/72 | HR 107 | Temp 98.2°F | Resp 16 | Ht 66.0 in | Wt 224.5 lb

## 2022-10-27 DIAGNOSIS — Z8701 Personal history of pneumonia (recurrent): Secondary | ICD-10-CM

## 2022-10-27 DIAGNOSIS — B379 Candidiasis, unspecified: Secondary | ICD-10-CM | POA: Diagnosis not present

## 2022-10-27 DIAGNOSIS — E119 Type 2 diabetes mellitus without complications: Secondary | ICD-10-CM

## 2022-10-27 DIAGNOSIS — Z794 Long term (current) use of insulin: Secondary | ICD-10-CM | POA: Diagnosis not present

## 2022-10-27 DIAGNOSIS — R300 Dysuria: Secondary | ICD-10-CM

## 2022-10-27 DIAGNOSIS — E785 Hyperlipidemia, unspecified: Secondary | ICD-10-CM

## 2022-10-27 DIAGNOSIS — E079 Disorder of thyroid, unspecified: Secondary | ICD-10-CM

## 2022-10-27 DIAGNOSIS — D649 Anemia, unspecified: Secondary | ICD-10-CM

## 2022-10-27 DIAGNOSIS — R1013 Epigastric pain: Secondary | ICD-10-CM

## 2022-10-27 DIAGNOSIS — E611 Iron deficiency: Secondary | ICD-10-CM | POA: Diagnosis not present

## 2022-10-27 LAB — POCT URINALYSIS DIPSTICK
Bilirubin, UA: NEGATIVE
Glucose, UA: NEGATIVE
Ketones, UA: NEGATIVE
Nitrite, UA: NEGATIVE
Odor: NORMAL
Protein, UA: NEGATIVE
Spec Grav, UA: 1.015 (ref 1.010–1.025)
Urobilinogen, UA: 0.2 U/dL
pH, UA: 6.5 (ref 5.0–8.0)

## 2022-10-27 MED ORDER — ROSUVASTATIN CALCIUM 20 MG PO TABS
20.0000 mg | ORAL_TABLET | Freq: Every day | ORAL | 1 refills | Status: DC
Start: 2022-10-27 — End: 2023-08-15

## 2022-10-27 NOTE — Progress Notes (Signed)
Established Patient Office Visit  Subjective:  Patient ID: Tamara [name], female    DOB: 02/13/1945  Age: 77 y.o. MRN: [id number]  CC:  Chief Complaint  Patient presents with   Follow-up    HPI Tamara [name] presents for follow up on chronic medical conditions. Since LOV she was hospitalized 09/23/22 - 09/30/22 severe sepsis with acute hypoxic respiratory failure due to COVID PNA. Was discharged on 3 L [place] with activity. She was treated with IV antibiotics, steroids and Lasix. Was seen by her Pulmonologist 10/13/22 for COPD left lower lobe rounded atelectasis that was evaluated by bronchoscopy and PET scan that were both negative. CT chest 08/02/22 showing masslike consolidation in the posterior left lower lobe 4.7 x 6 cm with a new masslike consolidation in the superior segment of the left lower lobe. PET scan 10/11/22 with persistent left lower lobe rounded lesion, more superior left lower lobe focal opacity/infiltrate with little to no hypermetabolic activity, consistent with resolving pneumonia as opposed to malignancy. Planning to repeat CT scan in December.  Overall patient states she is still short of breath with activity but is only using her oxygen if her sats drop below 88 to 90%.  She is not coughing or wheezing, no fevers.  She is using her maintenance inhaler daily and albuterol about every 4-6 hours.  Again in the ER on 09/21/2022 for epigastric pain and nausea.  She is currently on PPI therapy with pantoprazole 40 mg twice daily.  CT of her abdomen and pelvis was negative.  She was prescribed Pepcid, Carafate and Zofran, which she is currently not taking.  She is still experiencing epigastric pain although the nausea has improved.  Patient is also complaining of dysuria and itching/irritation in the vagina.  She denies changes in vaginal discharge.  She denies hematuria, increased urinary urgency or frequency or fevers.  She also denies flank pain.  Past Medical History:   Diagnosis Date   Arthritis    knee and shoulders   Colon polyps 09/05/2017   COPD (chronic obstructive pulmonary disease) (HCC)    Diabetes mellitus without complication (HCC)    type II   Dyspnea    05/16/20 has had a cough for 2.5 years post Covid   Gastritis 09/05/2017   GERD (gastroesophageal reflux disease)    Hyperlipidemia    Hypertension    patient denies   Hypothyroidism    Panic attack    RUQ pain 03/09/2017   Per patient, she has had RUQ pain 4-5 years that has grown worse in the last few months.    Past Surgical History:  Procedure Laterality Date   BREAST BIOPSY Left 2008   CORE W/CLIP - NEG   BRONCHIAL BIOPSY  05/19/2020   Procedure: BRONCHIAL BIOPSIES;  Surgeon: Leslye Peer, MD;  Location: [facility] ENDOSCOPY;  Service: Pulmonary;;   BRONCHIAL BRUSHINGS  05/19/2020   Procedure: BRONCHIAL BRUSHINGS;  Surgeon: Leslye Peer, MD;  Location: [facility] ENDOSCOPY;  Service: Pulmonary;;   BRONCHIAL NEEDLE ASPIRATION BIOPSY  05/19/2020   Procedure: BRONCHIAL NEEDLE ASPIRATION BIOPSIES;  Surgeon: Leslye Peer, MD;  Location: [facility] ENDOSCOPY;  Service: Pulmonary;;   BRONCHIAL WASHINGS  05/19/2020   Procedure: BRONCHIAL WASHINGS;  Surgeon: Leslye Peer, MD;  Location: [facility] ENDOSCOPY;  Service: Pulmonary;;   COLONOSCOPY WITH PROPOFOL N/A 02/08/2017   Procedure: COLONOSCOPY WITH PROPOFOL;  Surgeon: Christena Deem, MD;  Location: [facility] ENDOSCOPY;  Service: Endoscopy;  Laterality: N/A;   COLONOSCOPY WITH PROPOFOL N/A 05/05/2022  Procedure: COLONOSCOPY WITH PROPOFOL;  Surgeon: Toney Reil, MD;  Location: [facility] ENDOSCOPY;  Service: Gastroenterology;  Laterality: N/A;   ESOPHAGOGASTRODUODENOSCOPY (EGD) WITH PROPOFOL N/A 02/08/2017   Procedure: ESOPHAGOGASTRODUODENOSCOPY (EGD) WITH PROPOFOL;  Surgeon: Christena Deem, MD;  Location: [facility] ENDOSCOPY;  Service: Endoscopy;  Laterality: N/A;   TUBAL LIGATION     VIDEO BRONCHOSCOPY WITH ENDOBRONCHIAL NAVIGATION N/A 05/19/2020   Procedure:  VIDEO BRONCHOSCOPY WITH ENDOBRONCHIAL NAVIGATION;  Surgeon: Leslye Peer, MD;  Location: [facility] ENDOSCOPY;  Service: Pulmonary;  Laterality: N/A;    Family History  Problem Relation Age of Onset   Diabetes Maternal Grandmother    Breast cancer Neg Hx     Social History   Socioeconomic History   Marital status: Married    Spouse name: Janeeka Puhl   Number of children: 3   Years of education: Not on file   Highest education level: High school graduate  Occupational History   Occupation: retired  Tobacco Use   Smoking status: Former    Current packs/day: 0.00    Average packs/day: 1 pack/day for 40.0 years (40.0 ttl pk-yrs)    Types: Cigarettes    Start date: 1966    Quit date: 2006    Years since quitting: 18.7   Smokeless tobacco: Never   Tobacco comments:    Smoking cessation materials not required  Vaping Use   Vaping status: Never Used  Substance and Sexual Activity   Alcohol use: No   Drug use: No   Sexual activity: Not on file  Other Topics Concern   Not on file  Social History Narrative   Not on file   Social Determinants of Health   Financial Resource Strain: Medium Risk (10/20/2021)   Overall Financial Resource Strain (CARDIA)    Difficulty of Paying Living Expenses: Somewhat hard  Food Insecurity: No Food Insecurity (10/18/2022)   Hunger Vital Sign    Worried About Running Out of Food in the Last Year: Never true    Ran Out of Food in the Last Year: Never true  Transportation Needs: No Transportation Needs (10/18/2022)   PRAPARE - Administrator, Civil Service (Medical): No    Lack of Transportation (Non-Medical): No  Physical Activity: Inactive (10/20/2021)   Exercise Vital Sign    Days of Exercise per Week: 0 days    Minutes of Exercise per Session: 0 min  Stress: Stress Concern Present (10/20/2021)   [employer] of Occupational Health - Occupational Stress Questionnaire    Feeling of Stress : To some extent  Social Connections:  Moderately Integrated (10/20/2021)   Social Connection and Isolation Panel [NHANES]    Frequency of Communication with Friends and Family: More than three times a week    Frequency of Social Gatherings with Friends and Family: More than three times a week    Attends Religious Services: 1 to 4 times per year    Active Member of [employer] or Organizations: No    Attends Banker Meetings: Never    Marital Status: Married  Catering manager Violence: Not At Risk (10/18/2022)   Humiliation, Afraid, Rape, and Kick questionnaire    Fear of Current or Ex-Partner: No    Emotionally Abused: No    Physically Abused: No    Sexually Abused: No    Outpatient Medications Prior to Visit  Medication Sig Dispense Refill   Accu-Chek Softclix Lancets lancets TEST BLOOD SUGAR TWO TIMES DAILY 200 each 3   albuterol (PROVENTIL) (2.5 MG/3ML)  0.083% nebulizer solution Take 3 mLs (2.5 mg total) by nebulization every 6 (six) hours as needed for wheezing or shortness of breath. 75 mL 5   albuterol (VENTOLIN HFA) 108 (90 Base) MCG/ACT inhaler Inhale 2 puffs into the lungs every 6 (six) hours as needed for wheezing or shortness of breath. 8 g 6   Alcohol Swabs (DROPSAFE ALCOHOL PREP) 70 % PADS USE AS DIRECTED TWICE DAILY WHEN TESTING BLOOD SUGAR 200 each 3   Ascorbic Acid (VITAMIN C) 1000 MG tablet Take 1,000 mg by mouth daily.     aspirin EC 81 MG tablet Take 1 tablet (81 mg total) by mouth daily. Swallow whole. 90 tablet 3   Cholecalciferol (VITAMIN D) 50 MCG (2000 UT) CAPS Take 1,000 Units by mouth daily.     DROPLET PEN NEEDLES 32G X 4 MM MISC 1 Device by Other route daily. 200 each 3   ezetimibe (ZETIA) 10 MG tablet Take 1 tablet (10 mg total) by mouth daily. 90 tablet 3   famotidine (PEPCID) 20 MG tablet Take 1 tablet (20 mg total) by mouth 2 (two) times daily. 60 tablet 0   furosemide (LASIX) 40 MG tablet Take 1 tablet (40 mg total) by mouth daily. 90 tablet 1   gabapentin (NEURONTIN) 300 MG capsule Take  300 mg by mouth as needed.     Insulin NPH, Human,, Isophane, (NOVOLIN N FLEXPEN) 100 UNIT/ML Kiwkpen 36 units in am and 28-30 units at bedtime 60 mL 1   iron polysaccharides (NIFEREX) 150 MG capsule Take 1 capsule (150 mg total) by mouth daily. Or can take any other over-the-counter iron supplement.     levothyroxine (SYNTHROID) 50 MCG tablet TAKE 1 TABLET EVERY DAY BEFORE BREAKFAST 90 tablet 0   metFORMIN (GLUCOPHAGE) 1000 MG tablet Take 1 tablet (1,000 mg total) by mouth 2 (two) times daily. 180 tablet 3   metoCLOPramide (REGLAN) 10 MG tablet Take 1 tablet (10 mg total) by mouth every 6 (six) hours as needed. 30 tablet 0   nystatin (MYCOSTATIN/NYSTOP) powder Apply 1 Application topically daily as needed (yeast under belly). 60 g 2   ondansetron (ZOFRAN) 4 MG tablet Take 1 tablet (4 mg total) by mouth every 6 (six) hours as needed for nausea. 20 tablet 0   pantoprazole (PROTONIX) 40 MG tablet TAKE 1 TABLET TWICE DAILY 180 tablet 3   potassium chloride (KLOR-CON M) 10 MEQ tablet Take 1 tablet (10 mEq total) by mouth daily. 90 tablet 2   rosuvastatin (CRESTOR) 20 MG tablet TAKE 1 TABLET AT BEDTIME 90 tablet 0   sucralfate (CARAFATE) 1 g tablet Take 1 tablet (1 g total) by mouth 4 (four) times daily. 120 tablet 1   Tiotropium Bromide-Olodaterol (STIOLTO RESPIMAT) 2.5-2.5 MCG/ACT AERS Inhale 2 puffs into the lungs daily. 4 g 6   TRUE METRIX BLOOD GLUCOSE TEST test strip Use 2x a day 200 each 3   valsartan (DIOVAN) 80 MG tablet TAKE 1 TABLET (80 MG TOTAL) BY MOUTH DAILY. 90 tablet 3   No facility-administered medications prior to visit.    Allergies  Allergen Reactions   Penicillins Hives    Tolerated rocephin 09/2022 Reaction: 1965    ROS Review of Systems  Constitutional:  Negative for chills and fever.  Eyes:  Negative for visual disturbance.  Respiratory:  Positive for shortness of breath. Negative for cough and wheezing.   Cardiovascular:  Negative for chest pain.  Gastrointestinal:   Positive for abdominal pain. Negative for nausea.  Neurological:  Negative for dizziness and headaches.      Objective:    Physical Exam Constitutional:      Appearance: Normal appearance.  HENT:     Head: Normocephalic and atraumatic.  Eyes:     Conjunctiva/sclera: Conjunctivae normal.  Cardiovascular:     Rate and Rhythm: Normal rate and regular rhythm.  Pulmonary:     Effort: Pulmonary effort is normal.     Breath sounds: Normal breath sounds.     Comments: 93% on RA Musculoskeletal:     Right lower leg: No edema.     Left lower leg: No edema.  Skin:    General: Skin is warm and dry.  Neurological:     General: No focal deficit present.     Mental Status: She is alert. Mental status is at baseline.  Psychiatric:        Mood and Affect: Mood normal.        Behavior: Behavior normal.     BP 136/72   Pulse (!) 107   Temp 98.2 F (36.8 C)   Resp 16   Ht 5\' 6"  (1.676 m)   Wt 224 lb 8 oz (101.8 kg)   SpO2 93%   BMI 36.24 kg/m  Wt Readings from Last 3 Encounters:  10/27/22 224 lb 8 oz (101.8 kg)  10/17/22 226 lb (102.5 kg)  10/13/22 228 lb 12.8 oz (103.8 kg)     Health Maintenance Due  Topic Date Due   MAMMOGRAM  03/08/2019   INFLUENZA VACCINE  08/19/2022   Medicare Annual Wellness (AWV)  10/21/2022   Diabetic kidney evaluation - Urine ACR  10/22/2022   OPHTHALMOLOGY EXAM  10/07/2022    There are no preventive care reminders to display for this patient.  Lab Results  Component Value Date   TSH 3.34 08/26/2021   Lab Results  Component Value Date   WBC 5.3 10/18/2022   HGB 8.5 (L) 10/18/2022   HCT 24.8 (L) 10/18/2022   MCV 79.5 (L) 10/18/2022   PLT 262 10/18/2022   Lab Results  Component Value Date   NA 134 (L) 10/17/2022   K 3.5 10/17/2022   CO2 23 10/17/2022   GLUCOSE 150 (H) 10/17/2022   BUN 8 10/17/2022   CREATININE 0.99 10/18/2022   BILITOT 0.6 10/17/2022   ALKPHOS 55 10/17/2022   AST 20 10/17/2022   ALT 12 10/17/2022   PROT 7.4  10/17/2022   ALBUMIN 3.0 (L) 10/17/2022   CALCIUM 8.0 (L) 10/17/2022   ANIONGAP 11 10/17/2022   EGFR 50 (L) 05/27/2022   GFR 40.88 (L) 09/14/2022   Lab Results  Component Value Date   CHOL 140 03/15/2022   Lab Results  Component Value Date   HDL 50 03/15/2022   Lab Results  Component Value Date   LDLCALC 65 03/15/2022   Lab Results  Component Value Date   TRIG 148 03/15/2022   Lab Results  Component Value Date   CHOLHDL 2.8 03/15/2022   Lab Results  Component Value Date   HGBA1C 8.8 (H) 09/23/2022      Assessment & Plan:   1. History of pneumonia: Reviewed hospital discharge summary, pulmonology note 2 CT scans and PET scan of the lungs as well as labs.  Today she is satting 93% on room air.  She is using her maintenance inhaler daily and using albuterol every 4-6 hours as needed.  Planning on following up with pulmonology for repeat CT scan in December.  2. Epigastric pain: ER note  and CT A/P reviewed.  Patient is currently on Protonix 40 mg twice daily with continued breakthrough symptoms, will place referral to GI for EGD.  Patient is not taking any medications prescribed from the ER, recommend she add the Carafate with meals.   - Ambulatory referral to Gastroenterology  3. Anemia, unspecified type: Repeat CBC and iron panel today.  - CBC w/Diff/Platelet - Fe+TIBC+Fer  4. Thyroid disease: Check TSH.  Currently on levothyroxine 50 mcg daily.  - TSH  5. Type 2 diabetes mellitus without complication, with long-term current use of insulin (HCC): Check A1c.  - HgB A1c  6. Hyperlipidemia, unspecified hyperlipidemia type: Stable, refill statin..  - rosuvastatin (CRESTOR) 20 MG tablet; Take 1 tablet (20 mg total) by mouth at bedtime.  Dispense: 90 tablet; Refill: 1  7. Dysuria: Urine showing leukocytes will send for culture.  Will also obtain vaginal swab.  - POCT Urinalysis Dipstick - Cervicovaginal ancillary only - Urine Culture   Follow-up: Return in 4  weeks (on 11/24/2022).    Margarita Mail, DO

## 2022-10-28 LAB — IRON,TIBC AND FERRITIN PANEL
%SAT: 12 % — ABNORMAL LOW (ref 16–45)
Ferritin: 30 ng/mL (ref 16–288)
Iron: 38 ug/dL — ABNORMAL LOW (ref 45–160)
TIBC: 325 ug/dL (ref 250–450)

## 2022-10-28 LAB — CBC WITH DIFFERENTIAL/PLATELET
Absolute Monocytes: 330 {cells}/uL (ref 200–950)
Basophils Absolute: 50 {cells}/uL (ref 0–200)
Basophils Relative: 0.9 %
Eosinophils Absolute: 281 {cells}/uL (ref 15–500)
Eosinophils Relative: 5.1 %
HCT: 30.3 % — ABNORMAL LOW (ref 35.0–45.0)
Hemoglobin: 9.3 g/dL — ABNORMAL LOW (ref 11.7–15.5)
Lymphs Abs: 1128 {cells}/uL (ref 850–3900)
MCH: 25 pg — ABNORMAL LOW (ref 27.0–33.0)
MCHC: 30.7 g/dL — ABNORMAL LOW (ref 32.0–36.0)
MCV: 81.5 fL (ref 80.0–100.0)
MPV: 10.3 fL (ref 7.5–12.5)
Monocytes Relative: 6 %
Neutro Abs: 3713 {cells}/uL (ref 1500–7800)
Neutrophils Relative %: 67.5 %
Platelets: 230 10*3/uL (ref 140–400)
RBC: 3.72 10*6/uL — ABNORMAL LOW (ref 3.80–5.10)
RDW: 15.8 % — ABNORMAL HIGH (ref 11.0–15.0)
Total Lymphocyte: 20.5 %
WBC: 5.5 10*3/uL (ref 3.8–10.8)

## 2022-10-28 LAB — HEMOGLOBIN A1C
Hgb A1c MFr Bld: 8.7 %{Hb} — ABNORMAL HIGH (ref <5.7)
Mean Plasma Glucose: 203 mg/dL
eAG (mmol/L): 11.2 mmol/L

## 2022-10-28 LAB — COMPLETE METABOLIC PANEL WITH GFR
AG Ratio: 1.4 (calc) (ref 1.0–2.5)
ALT: 10 U/L (ref 6–29)
AST: 13 U/L (ref 10–35)
Albumin: 3.8 g/dL (ref 3.6–5.1)
Alkaline phosphatase (APISO): 53 U/L (ref 37–153)
BUN/Creatinine Ratio: 5 (calc) — ABNORMAL LOW (ref 6–22)
BUN: 5 mg/dL — ABNORMAL LOW (ref 7–25)
CO2: 23 mmol/L (ref 20–32)
Calcium: 9.6 mg/dL (ref 8.6–10.4)
Chloride: 106 mmol/L (ref 98–110)
Creat: 1.02 mg/dL — ABNORMAL HIGH (ref 0.60–1.00)
Globulin: 2.7 g/dL (ref 1.9–3.7)
Glucose, Bld: 170 mg/dL — ABNORMAL HIGH (ref 65–99)
Potassium: 4.8 mmol/L (ref 3.5–5.3)
Sodium: 140 mmol/L (ref 135–146)
Total Bilirubin: 0.5 mg/dL (ref 0.2–1.2)
Total Protein: 6.5 g/dL (ref 6.1–8.1)
eGFR: 57 mL/min/{1.73_m2} — ABNORMAL LOW (ref >=60)

## 2022-10-28 LAB — CERVICOVAGINAL ANCILLARY ONLY
Bacterial Vaginitis (gardnerella): NEGATIVE
Candida Glabrata: NEGATIVE
Candida Vaginitis: POSITIVE — AB
Chlamydia: NEGATIVE
Comment: NEGATIVE
Comment: NEGATIVE
Comment: NEGATIVE
Comment: NEGATIVE
Comment: NEGATIVE
Comment: NORMAL
Neisseria Gonorrhea: NEGATIVE
Trichomonas: NEGATIVE

## 2022-10-28 LAB — URINE CULTURE
MICRO NUMBER:: 15573062
SPECIMEN QUALITY:: ADEQUATE

## 2022-10-28 LAB — MAGNESIUM: Magnesium: 1.2 mg/dL — ABNORMAL LOW (ref 1.5–2.5)

## 2022-10-28 LAB — TSH: TSH: 3.33 m[IU]/L (ref 0.40–4.50)

## 2022-10-29 MED ORDER — FLUCONAZOLE 150 MG PO TABS: 150.0000 mg | ORAL_TABLET | Freq: Once | ORAL | 0 refills | Status: AC

## 2022-10-29 MED ORDER — FE CAPS/STOOL SOFTENER 50-100 MG PO TABS
50.0000 mg | ORAL_TABLET | Freq: Every day | ORAL | 3 refills | Status: AC
Start: 2022-10-29 — End: ?

## 2022-10-29 NOTE — Addendum Note (Signed)
Addended by: Margarita Mail on: 10/29/2022 08:03 AM   Modules accepted: Orders

## 2022-11-01 ENCOUNTER — Ambulatory Visit: Payer: Medicare HMO | Admitting: Internal Medicine

## 2022-11-14 ENCOUNTER — Encounter: Payer: Self-pay | Admitting: Cardiovascular Disease

## 2022-11-14 NOTE — Progress Notes (Unsigned)
Cardiology Office Note:  . Oct. 28, 2024   Date:  11/15/2022  ID:  Tamara [name], DOB January 31, 1945, MRN [id number] PCP: Tamara Mail, DO  [facility] Providers Cardiologist:  Tamara Sprague, MD (Inactive) {  History of Present Illness: .   Tamara [name] is a 77 y.o. female  previous patient of Dr. Carrolyn [name], seen for the first time today   Seen with Tamara [name], Husband    Hx of lung mass, cavitary lung lesion Coronary artery calcifications ( CAC = 1198, 95th percentile for age , sex matched controls   Hx of HLD DM Hypothyroidism COPD  - quit smoking in 2006    No cp ,  No real exercise, Does housework  Was hospitalized with COVID pneumonia last month Was on home O2 for a while  Is now off the home O2 .   Takes her lasix PRN - when she feels like   ROS:    Studies Reviewed: .         Risk Assessment/Calculations:             Physical Exam:   VS:  BP 126/64   Pulse (!) 109   Ht 5\' 6"  (1.676 m)   Wt 223 lb 6.4 oz (101.3 kg)   SpO2 92%   BMI 36.06 kg/m    Wt Readings from Last 3 Encounters:  11/15/22 223 lb 6.4 oz (101.3 kg)  10/27/22 224 lb 8 oz (101.8 kg)  10/17/22 226 lb (102.5 kg)    GEN: Well nourished, well developed in no acute distress NECK: No JVD; No carotid bruits CARDIAC: RRR, no murmurs, rubs, gallops RESPIRATORY:  Clear to auscultation without rales, wheezing or rhonchi  ABDOMEN: Soft, non-tender, non-distended EXTREMITIES:  trace ankle  edema; No deformity   ASSESSMENT AND PLAN: .   1.  Coronary artery calcifications: Tamara [name] presents for further evaluation management of her coronary calcifications.  She is very stable.  She has chronic shortness of breath due to her COPD.  She is not having any chest pain.  Her LDL goal is less than 70.  Her last LDL is 65.  Continue current medications.  She will follow-up with [place] in 1 year       Dispo: 1 year with Dr. Herbie [name]  Signed, Tamara Miss, MD

## 2022-11-15 ENCOUNTER — Other Ambulatory Visit: Payer: Self-pay | Admitting: Family Medicine

## 2022-11-15 ENCOUNTER — Encounter: Payer: Self-pay | Admitting: Cardiovascular Disease

## 2022-11-15 ENCOUNTER — Ambulatory Visit: Payer: Medicare HMO | Attending: Cardiology | Admitting: Cardiovascular Disease

## 2022-11-15 ENCOUNTER — Ambulatory Visit: Payer: Medicare HMO | Admitting: Cardiology

## 2022-11-15 VITALS — BP 126/64 | HR 109 | Ht 66.0 in | Wt 223.4 lb

## 2022-11-15 DIAGNOSIS — R600 Localized edema: Secondary | ICD-10-CM

## 2022-11-15 DIAGNOSIS — I2510 Atherosclerotic heart disease of native coronary artery without angina pectoris: Secondary | ICD-10-CM

## 2022-11-21 ENCOUNTER — Other Ambulatory Visit: Payer: Self-pay | Admitting: Family Medicine

## 2022-11-22 ENCOUNTER — Other Ambulatory Visit: Payer: Self-pay
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
## 2022-11-25 ENCOUNTER — Ambulatory Visit: Payer: Medicare HMO | Admitting: Internal Medicine
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
## 2022-11-28 ENCOUNTER — Other Ambulatory Visit: Payer: Self-pay | Admitting: Internal Medicine

## 2022-11-28 DIAGNOSIS — E1165 Type 2 diabetes mellitus with hyperglycemia: Secondary | ICD-10-CM

## 2022-11-29 ENCOUNTER — Ambulatory Visit: Payer: Medicare HMO | Admitting: Internal Medicine

## 2022-12-01 ENCOUNTER — Telehealth: Payer: Self-pay

## 2022-12-01 NOTE — Telephone Encounter (Signed)
Needs f/u appt for further refills

## 2022-12-01 NOTE — Telephone Encounter (Signed)
Per Dr. Gherghe:Needs f/u appt for further refills

## 2022-12-02 ENCOUNTER — Ambulatory Visit (INDEPENDENT_AMBULATORY_CARE_PROVIDER_SITE_OTHER): Payer: Medicare HMO

## 2022-12-02 DIAGNOSIS — Z780 Asymptomatic menopausal state: Secondary | ICD-10-CM | POA: Diagnosis not present

## 2022-12-02 DIAGNOSIS — E119 Type 2 diabetes mellitus without complications: Secondary | ICD-10-CM | POA: Diagnosis not present

## 2022-12-02 DIAGNOSIS — Z7984 Long term (current) use of oral hypoglycemic drugs: Secondary | ICD-10-CM

## 2022-12-02 DIAGNOSIS — Z0000 Encounter for general adult medical examination without abnormal findings: Secondary | ICD-10-CM | POA: Diagnosis not present

## 2022-12-02 DIAGNOSIS — Z0001 Encounter for general adult medical examination with abnormal findings: Secondary | ICD-10-CM | POA: Diagnosis not present

## 2022-12-02 DIAGNOSIS — Z794 Long term (current) use of insulin: Secondary | ICD-10-CM

## 2022-12-02 NOTE — Patient Instructions (Addendum)
Tamara [name] , Thank you for taking time to come for your Medicare Wellness Visit. I appreciate your ongoing commitment to your health goals. Please review the following plan we discussed and let me know if I can assist you in the future.   Referrals/Orders/Follow-Ups/Clinician Recommendations: none  This is a list of the screening recommended for you and due dates:  Health Maintenance  Topic Date Due   Mammogram  03/08/2019   DEXA scan (bone density measurement)  03/07/2020   Eye exam for diabetics  10/07/2022   Yearly kidney health urinalysis for diabetes  10/22/2022   Zoster (Shingles) Vaccine (1 of 2) 01/27/2023*   Flu Shot  04/18/2023*   Hemoglobin A1C  04/27/2023   Complete foot exam   06/10/2023   Yearly kidney function blood test for diabetes  10/27/2023   Medicare Annual Wellness Visit  12/02/2023   Colon Cancer Screening  05/04/2025   Pneumonia Vaccine  Completed   Hepatitis C Screening  Completed   HPV Vaccine  Aged Out   DTaP/Tdap/Td vaccine  Discontinued   COVID-19 Vaccine  Discontinued  *Topic was postponed. The date shown is not the original due date.    Advanced directives: (ACP Link)Information on Advanced Care Planning can be found at [facility] Psychiatric Directives Advance Health Care Directives ([email or web address]   Next Medicare Annual Wellness Visit scheduled for next year: Yes    12/22/23 @ 9:30 am in person

## 2022-12-02 NOTE — Progress Notes (Signed)
Subjective:   Tamara [name] is a 77 y.o. female who presents for Medicare Annual (Subsequent) preventive examination.  Visit Complete: Virtual I connected with  Emi Holes on 12/02/22 by a audio enabled telemedicine application and verified that I am speaking with the correct person using two identifiers.  Patient Location: Home  Provider Location: Office/Clinic  I discussed the limitations of evaluation and management by telemedicine. The patient expressed understanding and agreed to proceed.  Vital Signs: Because this visit was a virtual/telehealth visit, some criteria may be missing or patient reported. Any vitals not documented were not able to be obtained and vitals that have been documented are patient reported.  Cardiac Risk Factors include: advanced age (>57men, >40 women);diabetes mellitus;hypertension;dyslipidemia;sedentary lifestyle;obesity (BMI >30kg/m2)     Objective:    There were no vitals filed for this visit. There is no height or weight on file to calculate BMI.     12/02/2022    2:53 PM 10/18/2022    7:06 AM 10/17/2022    4:47 PM 09/23/2022   11:57 AM 05/05/2022    9:42 AM 04/29/2022   11:19 AM 10/16/2020   11:44 AM  Advanced Directives  Does Patient Have a Medical Advance Directive? No No No No No No No  Would patient like information on creating a medical advance directive? No - Patient declined No - Patient declined  No - Patient declined   No - Patient declined    Current Medications (verified) Outpatient Encounter Medications as of 12/02/2022  Medication Sig   Accu-Chek Softclix Lancets lancets TEST BLOOD SUGAR TWO TIMES DAILY   albuterol (PROVENTIL) (2.5 MG/3ML) 0.083% nebulizer solution Take 3 mLs (2.5 mg total) by nebulization every 6 (six) hours as needed for wheezing or shortness of breath.   albuterol (VENTOLIN HFA) 108 (90 Base) MCG/ACT inhaler Inhale 2 puffs into the lungs every 6 (six) hours as needed for wheezing or shortness of  breath.   Alcohol Swabs (DROPSAFE ALCOHOL PREP) 70 % PADS USE AS DIRECTED TWICE DAILY WHEN TESTING BLOOD SUGAR   Ascorbic Acid (VITAMIN C) 1000 MG tablet Take 1,000 mg by mouth daily.   aspirin EC 81 MG tablet Take 1 tablet (81 mg total) by mouth daily. Swallow whole.   Cholecalciferol (VITAMIN D) 50 MCG (2000 UT) CAPS Take 1,000 Units by mouth daily.   DROPLET PEN NEEDLES 32G X 4 MM MISC 1 Device by Other route daily.   ezetimibe (ZETIA) 10 MG tablet Take 1 tablet (10 mg total) by mouth daily.   Ferrous Fumarate (HEMOCYTE - 106 MG FE) 324 (106 Fe) MG TABS tablet Take 1 tablet by mouth daily.   Ferrous Fumarate-DSS (FE CAPS/STOOL SOFTENER) 50-100 MG TABS Take 50-100 mg by mouth daily.   furosemide (LASIX) 40 MG tablet TAKE 1 TABLET EVERY DAY   gabapentin (NEURONTIN) 300 MG capsule Take 300 mg by mouth as needed.   Insulin NPH, Human,, Isophane, (NOVOLIN N FLEXPEN) 100 UNIT/ML Kiwkpen INJECT 36 UNITS UNDER THE SKIN IN THE MORNING AND 28 TO 30 UNITS AT BEDTIME   levothyroxine (SYNTHROID) 50 MCG tablet TAKE 1 TABLET EVERY DAY BEFORE BREAKFAST   metFORMIN (GLUCOPHAGE) 1000 MG tablet Take 1 tablet (1,000 mg total) by mouth 2 (two) times daily.   nystatin (MYCOSTATIN/NYSTOP) powder Apply 1 Application topically daily as needed (yeast under belly).   pantoprazole (PROTONIX) 40 MG tablet TAKE 1 TABLET TWICE DAILY   potassium chloride (KLOR-CON M) 10 MEQ tablet Take 1 tablet (10 mEq total) by  mouth daily.   rosuvastatin (CRESTOR) 20 MG tablet Take 1 tablet (20 mg total) by mouth at bedtime.   Tiotropium Bromide-Olodaterol (STIOLTO RESPIMAT) 2.5-2.5 MCG/ACT AERS Inhale 2 puffs into the lungs daily.   TRUE METRIX BLOOD GLUCOSE TEST test strip Use 2x a day   valsartan (DIOVAN) 80 MG tablet TAKE 1 TABLET (80 MG TOTAL) BY MOUTH DAILY.   famotidine (PEPCID) 20 MG tablet Take 1 tablet (20 mg total) by mouth 2 (two) times daily. (Patient not taking: Reported on 11/15/2022)   metoCLOPramide (REGLAN) 10 MG tablet  Take 1 tablet (10 mg total) by mouth every 6 (six) hours as needed. (Patient not taking: Reported on 12/02/2022)   ondansetron (ZOFRAN) 4 MG tablet Take 1 tablet (4 mg total) by mouth every 6 (six) hours as needed for nausea. (Patient not taking: Reported on 12/02/2022)   sucralfate (CARAFATE) 1 g tablet Take 1 tablet (1 g total) by mouth 4 (four) times daily. (Patient not taking: Reported on 12/02/2022)   No facility-administered encounter medications on file as of 12/02/2022.    Allergies (verified) Penicillins   History: Past Medical History:  Diagnosis Date   Arthritis    knee and shoulders   Colon polyps 09/05/2017   COPD (chronic obstructive pulmonary disease) (HCC)    Diabetes mellitus without complication (HCC)    type II   Dyspnea    05/16/20 has had a cough for 2.5 years post Covid   Gastritis 09/05/2017   GERD (gastroesophageal reflux disease)    Hyperlipidemia    Hypertension    patient denies   Hypothyroidism    Panic attack    RUQ pain 03/09/2017   Per patient, she has had RUQ pain 4-5 years that has grown worse in the last few months.   Past Surgical History:  Procedure Laterality Date   BREAST BIOPSY Left 2008   CORE W/CLIP - NEG   BRONCHIAL BIOPSY  05/19/2020   Procedure: BRONCHIAL BIOPSIES;  Surgeon: Leslye Peer, MD;  Location[facility] ENDOSCOPY;  Service: Pulmonary;;   BRONCHIAL BRUSHINGS  05/19/2020   Procedure: BRONCHIAL BRUSHINGS;  Surgeon: Leslye Peer, MD;  Location: [facility] ENDOSCOPY;  Service: Pulmonary;;   BRONCHIAL NEEDLE ASPIRATION BIOPSY  05/19/2020   Procedure: BRONCHIAL NEEDLE ASPIRATION BIOPSIES;  Surgeon: Leslye Peer, MD;  [facility] ENDOSCOPY;  Service: Pulmonary;;   BRONCHIAL WASHINGS  05/19/2020   Procedure: BRONCHIAL WASHINGS;  Surgeon: Leslye Peer, MD;  Location: [facility] ENDOSCOPY;  Service: Pulmonary;;   COLONOSCOPY WITH PROPOFOL N/A 02/08/2017   Procedure: COLONOSCOPY WITH PROPOFOL;  Surgeon: Christena Deem, MD;  Location: [facility]  ENDOSCOPY;  Service: Endoscopy;  Laterality: N/A;   COLONOSCOPY WITH PROPOFOL N/A 05/05/2022   Procedure: COLONOSCOPY WITH PROPOFOL;  Surgeon: Toney Reil, MD;  Location: [facility] ENDOSCOPY;  Service: Gastroenterology;  Laterality: N/A;   ESOPHAGOGASTRODUODENOSCOPY (EGD) WITH PROPOFOL N/A 02/08/2017   Procedure: ESOPHAGOGASTRODUODENOSCOPY (EGD) WITH PROPOFOL;  Surgeon: Christena Deem, MD;  Location[facility] ENDOSCOPY;  Service: Endoscopy;  Laterality: N/A;   TUBAL LIGATION     VIDEO BRONCHOSCOPY WITH ENDOBRONCHIAL NAVIGATION N/A 05/19/2020   Procedure: VIDEO BRONCHOSCOPY WITH ENDOBRONCHIAL NAVIGATION;  Surgeon: Leslye Peer, MD;  Location: [facility] ENDOSCOPY;  Service: Pulmonary;  Laterality: N/A;   Family History  Problem Relation Age of Onset   Diabetes Maternal Grandmother    Breast cancer Neg Hx    Social History   Socioeconomic History   Marital status: Married    Spouse name: Navae Franczak  Number of children: 3   Years of education: Not on file   Highest education level: High school graduate  Occupational History   Occupation: retired  Tobacco Use   Smoking status: Former    Current packs/day: 0.00    Average packs/day: 1 pack/day for 40.0 years (40.0 ttl pk-yrs)    Types: Cigarettes    Start date: 1966    Quit date: 2006    Years since quitting: 18.8   Smokeless tobacco: Never   Tobacco comments:    Smoking cessation materials not required  Vaping Use   Vaping status: Never Used  Substance and Sexual Activity   Alcohol use: No   Drug use: No   Sexual activity: Not on file  Other Topics Concern   Not on file  Social History Narrative   Not on file   Social Determinants of Health   Financial Resource Strain: Low Risk  (12/02/2022)   Overall Financial Resource Strain (CARDIA)    Difficulty of Paying Living Expenses: Not hard at all  Food Insecurity: No Food Insecurity (12/02/2022)   Hunger Vital Sign    Worried About Running Out of Food in the Last Year:  Never true    Ran Out of Food in the Last Year: Never true  Transportation Needs: No Transportation Needs (12/02/2022)   PRAPARE - Administrator, Civil Service (Medical): No    Lack of Transportation (Non-Medical): No  Physical Activity: Insufficiently Active (12/02/2022)   Exercise Vital Sign    Days of Exercise per Week: 3 days    Minutes of Exercise per Session: 30 min  Stress: No Stress Concern Present (12/02/2022)   [employer] of Occupational Health - Occupational Stress Questionnaire    Feeling of Stress : Not at all  Social Connections: Moderately Isolated (12/02/2022)   Social Connection and Isolation Panel [NHANES]    Frequency of Communication with Friends and Family: More than three times a week    Frequency of Social Gatherings with Friends and Family: Once a week    Attends Religious Services: Never    Database administrator or Organizations: No    Attends Engineer, structural: Never    Marital Status: Married    Tobacco Counseling Counseling given: Not Answered Tobacco comments: Smoking cessation materials not required   Clinical Intake:  Pre-visit preparation completed: Yes  Pain : No/denies pain     BMI - recorded: 36 Nutritional Status: BMI > 30  Obese Nutritional Risks: None Diabetes: Yes CBG done?: No Did pt. bring in CBG monitor from home?: No  How often do you need to have someone help you when you read instructions, pamphlets, or other written materials from your doctor or pharmacy?: 1 - Never  Interpreter Needed?: No  Information entered by :: Kennedy Bucker, LPN   Activities of Daily Living    12/02/2022    2:55 PM 10/27/2022    9:46 AM  In your present state of health, do you have any difficulty performing the following activities:  Hearing? 0 0  Vision? 0 0  Difficulty concentrating or making decisions? 0 0  Walking or climbing stairs? 1 1  Comment knee pain   Dressing or bathing? 0 0  Doing errands,  shopping? 0 0  Preparing Food and eating ? N   Using the Toilet? N   In the past six months, have you accidently leaked urine? N   Do you have problems with loss of bowel control? N  Managing your Medications? N   Managing your Finances? N   Housekeeping or managing your Housekeeping? N     Patient Care Team: Margarita Mail, DO as PCP - General (Internal Medicine) Meriam Sprague, MD (Inactive) as PCP - Cardiology (Cardiology) Pa, [facility]  Indicate any recent Medical Services you may have received from other than [facility] providers in the past year (date may be approximate).     Assessment:   This is a routine wellness examination for Deavian.  Hearing/Vision screen Hearing Screening - Comments:: No aids Vision Screening - Comments:: Wears glasses- [facility]   Goals Addressed             This Visit's Progress    DIET - EAT MORE FRUITS AND VEGETABLES         Depression Screen    12/02/2022    2:50 PM 10/27/2022    9:46 AM 05/31/2022    2:20 PM 04/29/2022   10:26 AM 03/01/2022    2:26 PM 12/21/2021    3:12 PM 10/21/2021    1:35 PM  PHQ 2/9 Scores  PHQ - 2 Score 2 1 0 0 0 0 0  PHQ- 9 Score 4 1 0 0 0 0 9    Fall Risk    12/02/2022    2:55 PM 10/27/2022    9:46 AM 05/31/2022    2:20 PM 04/29/2022   10:26 AM 03/01/2022    2:26 PM  Fall Risk   Falls in the past year? 0 0 0 0 0  Number falls in past yr: 0 0 0 0 0  Injury with Fall? 0 0 0 0 0  Risk for fall due to : No Fall Risks  No Fall Risks    Follow up Falls prevention discussed;Falls evaluation completed  Falls prevention discussed;Education provided;Falls evaluation completed      MEDICARE RISK AT HOME:    TIMED UP AND GO:  Was the test performed?  No    Cognitive Function:        10/20/2021   11:21 AM 10/16/2019   11:50 AM 10/10/2018   11:49 AM  6CIT Screen  What Year? 0 points 0 points 0 points  What month? 0 points 0 points 0 points  What time? 0 points 0 points 0 points   Count back from 20 0 points 0 points 0 points  Months in reverse 0 points 0 points 0 points  Repeat phrase 0 points 2 points 0 points  Total Score 0 points 2 points 0 points    Immunizations Immunization History  Administered Date(s) Administered   Fluad Quad(high Dose 65+) 09/20/2018, 10/16/2019, 10/16/2020   Influenza, High Dose Seasonal PF 10/16/2019   Influenza,inj,Quad PF,6+ Mos 10/18/2016, 09/29/2017, 10/20/2021   Pneumococcal Conjugate-13 10/16/2019   Pneumococcal Polysaccharide-23 03/26/2013    TDAP status: Due, Education has been provided regarding the importance of this vaccine. Advised may receive this vaccine at local pharmacy or Health Dept. Aware to provide a copy of the vaccination record if obtained from local pharmacy or Health Dept. Verbalized acceptance and understanding.  Flu Vaccine status: Declined, Education has been provided regarding the importance of this vaccine but patient still declined. Advised may receive this vaccine at local pharmacy or Health Dept. Aware to provide a copy of the vaccination record if obtained from local pharmacy or Health Dept. Verbalized acceptance and understanding.  Pneumococcal vaccine status: Up to date  Covid-19 vaccine status: Declined, Education has been provided regarding the importance  of this vaccine but patient still declined. Advised may receive this vaccine at local pharmacy or Health Dept.or vaccine clinic. Aware to provide a copy of the vaccination record if obtained from local pharmacy or Health Dept. Verbalized acceptance and understanding.  Qualifies for Shingles Vaccine? Yes   Zostavax completed No   Shingrix Completed?: No.    Education has been provided regarding the importance of this vaccine. Patient has been advised to call insurance company to determine out of pocket expense if they have not yet received this vaccine. Advised may also receive vaccine at local pharmacy or Health Dept. Verbalized acceptance and  understanding.  Screening Tests Health Maintenance  Topic Date Due   MAMMOGRAM  03/08/2019   DEXA SCAN  03/07/2020   INFLUENZA VACCINE  08/19/2022   OPHTHALMOLOGY EXAM  10/07/2022   Diabetic kidney evaluation - Urine ACR  10/22/2022   Zoster Vaccines- Shingrix (1 of 2) 01/27/2023 (Originally 10/30/1964)   HEMOGLOBIN A1C  04/27/2023   FOOT EXAM  06/10/2023   Diabetic kidney evaluation - eGFR measurement  10/27/2023   Medicare Annual Wellness (AWV)  12/02/2023   Colonoscopy  05/04/2025   Pneumonia Vaccine 32+ Years old  Completed   Hepatitis C Screening  Completed   HPV VACCINES  Aged Out   DTaP/Tdap/Td  Discontinued   COVID-19 Vaccine  Discontinued    Health Maintenance  Health Maintenance Due  Topic Date Due   MAMMOGRAM  03/08/2019   DEXA SCAN  03/07/2020   INFLUENZA VACCINE  08/19/2022   OPHTHALMOLOGY EXAM  10/07/2022   Diabetic kidney evaluation - Urine ACR  10/22/2022    Colorectal cancer screening: Type of screening: Colonoscopy. Completed 05/05/22. Repeat every 3 years  Mammogram status: Ordered 03/01/22. Pt provided with contact info and advised to call to schedule appt.   Bone Density status: Ordered 12/02/22. Pt provided with contact info and advised to call to schedule appt.  Lung Cancer Screening: (Low Dose CT Chest recommended if Age 78-80 years, 20 pack-year currently smoking OR have quit w/in 15years.) does not qualify.   Additional Screening:  Hepatitis C Screening: does qualify; Completed 08/23/16  Vision Screening: Recommended annual ophthalmology exams for early detection of glaucoma and other disorders of the eye. Is the patient up to date with their annual eye exam?  Yes  Who is the provider or what is the name of the office in which the patient attends annual eye exams? [facility] If pt is not established with a provider, would they like to be referred to a provider to establish care? No .   Dental Screening: Recommended annual dental exams for  proper oral hygiene  Diabetic Foot Exam: Diabetic Foot Exam: Completed 06/10/22  Community Resource Referral / Chronic Care Management: CRR required this visit?  No   CCM required this visit?  No     Plan:     I have personally reviewed and noted the following in the patient's chart:   Medical and social history Use of alcohol, tobacco or illicit drugs  Current medications and supplements including opioid prescriptions. Patient is not currently taking opioid prescriptions. Functional ability and status Nutritional status Physical activity Advanced directives List of other physicians Hospitalizations, surgeries, and ER visits in previous 12 months Vitals Screenings to include cognitive, depression, and falls Referrals and appointments  In addition, I have reviewed and discussed with patient certain preventive protocols, quality metrics, and best practice recommendations. A written personalized care plan for preventive services as well as general preventive  health recommendations were provided to patient.     Hal Hope, LPN   [id number]   After Visit Summary: (MyChart) Due to this being a telephonic visit, the after visit summary with patients personalized plan was offered to patient via MyChart   Nurse Notes: ordered dexa, [phone]

## 2022-12-03 ENCOUNTER — Other Ambulatory Visit: Payer: Self-pay | Admitting: Internal Medicine

## 2022-12-03 DIAGNOSIS — E079 Disorder of thyroid, unspecified: Secondary | ICD-10-CM
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
## 2022-12-10 ENCOUNTER — Telehealth: Payer: Self-pay | Admitting: Emergency Medicine

## 2022-12-10 MED ORDER — DOXYCYCLINE HYCLATE 100 MG PO TABS: 100.0000 mg | ORAL_TABLET | Freq: Two times a day (BID) | ORAL | 0 refills | Status: DC

## 2022-12-10 NOTE — Addendum Note (Signed)
Addended by: Hal Hope on: 12/10/2022 02:10 PM   Modules accepted: Orders

## 2022-12-10 NOTE — Telephone Encounter (Signed)
COPD -stable no increased dsypnea.  NO O2.  Last seen in the office September 2024 for follow-up pneumonia.-Followed for known left lower lobe consolidation has follow-up in the office with Dr. Delton Coombes in January 2024.   Increased cough/congestion , blood tinged mucus- slight bit of blood mixed in mucus x 2 days. No frank hemoptysis.   Patient says overall she feels okay but is concerned because she saw some specks of blood.  She remains on her Stiolto daily.  Has albuterol if she needs it no increased albuterol use.  Advised that if symptoms or not improving she will need to call our office for sooner follow-up with chest x-ray.  If does not stop or worsens need to go to the emergency room.  Patient verbalizes understanding.  Will send in doxycycline twice daily for 7 days.  Patient is aware to take with food and also use sunscreen if out in the sunlight.   Please contact office for sooner follow up if symptoms do not improve or worsen or seek emergency care

## 2022-12-10 NOTE — Telephone Encounter (Signed)
Completed by [name] NP.  Nothing further needed.

## 2022-12-10 NOTE — Telephone Encounter (Signed)
Patient is cough up blood. It started 2 days ago.  Call back number [phone]

## 2022-12-17 ENCOUNTER — Telehealth: Payer: Self-pay | Admitting: Adult Health

## 2022-12-17 MED ORDER — DOXYCYCLINE HYCLATE 100 MG PO TABS: 100.0000 mg | ORAL_TABLET | Freq: Two times a day (BID) | ORAL | 0 refills | Status: DC

## 2022-12-17 NOTE — Telephone Encounter (Signed)
Called in with ongoing sx of cough/congestion and thick mucus , Tx for COPD flare last week -phone message with Doxycycline x 1 week, has one day left. Says she is feeling better but worried that mucus is still thick . Taking Stiolto but only has few days left. Needs sample until 1/1 due to insurance /medication gap .  No hemoptysis . Initially had some blood tinged mucus but that has resolved.  No fever. Good appetite no n/v/d.   Will call in Doxycycyline for additional 3 days (total 10 days )   Continue on current maintenance regimen   Triage : please leave 2 Stiolto samples at front desk for patient  She is to call for ov follow up in next couple of weeks  Please contact office for sooner follow up if symptoms do not improve or worsen or seek emergency care

## 2022-12-20 ENCOUNTER — Telehealth: Payer: Self-pay | Admitting: Adult Health

## 2022-12-20 ENCOUNTER — Ambulatory Visit: Payer: Medicare HMO

## 2022-12-20 ENCOUNTER — Other Ambulatory Visit: Payer: Self-pay | Admitting: Emergency Medicine

## 2022-12-20 DIAGNOSIS — R059 Cough, unspecified: Secondary | ICD-10-CM

## 2022-12-20 NOTE — Addendum Note (Signed)
Addended by: Delrae Rend on: 12/20/2022 03:32 PM   Modules accepted: Orders

## 2022-12-21 ENCOUNTER — Telehealth: Payer: Self-pay | Admitting: Adult Health

## 2022-12-21 NOTE — Telephone Encounter (Signed)
Chest xray has not changed from previous CT Chest/PET scan.  Shows Left lower lobe mass/consolidation  Dr. Delton Coombes  has CT chest set up this month and OV after to review  Please keep follow up  Please contact office for sooner follow up if symptoms do not improve or worsen or seek emergency care

## 2022-12-21 NOTE — Telephone Encounter (Signed)
Patient would like to know the results of her Chest X-Ray from December 2,2024.  [id number]

## 2022-12-22 NOTE — Telephone Encounter (Signed)
Please let the patient know that her chest x-ray is unchanged compared with her priors including her most recent chest x-ray in September and her recent scans.  There is no evidence of any new pneumonia or abnormality.  Good news.

## 2022-12-23 ENCOUNTER — Ambulatory Visit: Payer: Medicare HMO | Admitting: Internal Medicine

## 2022-12-23 ENCOUNTER — Encounter: Payer: Self-pay | Admitting: Internal Medicine

## 2022-12-23 VITALS — BP 124/68 | HR 116 | Ht 66.0 in | Wt 222.2 lb

## 2022-12-23 DIAGNOSIS — E1159 Type 2 diabetes mellitus with other circulatory complications: Secondary | ICD-10-CM

## 2022-12-23 DIAGNOSIS — Z6838 Body mass index (BMI) 38.0-38.9, adult: Secondary | ICD-10-CM | POA: Diagnosis not present

## 2022-12-23 DIAGNOSIS — E66812 Obesity, class 2: Secondary | ICD-10-CM | POA: Diagnosis not present

## 2022-12-23 DIAGNOSIS — E1165 Type 2 diabetes mellitus with hyperglycemia: Secondary | ICD-10-CM | POA: Diagnosis not present

## 2022-12-23 DIAGNOSIS — Z7984 Long term (current) use of oral hypoglycemic drugs: Secondary | ICD-10-CM

## 2022-12-23 LAB — POCT GLYCOSYLATED HEMOGLOBIN (HGB A1C): Hemoglobin A1C: 7 % — AB (ref 4.0–5.6)

## 2022-12-23 MED ORDER — NOVOLIN N FLEXPEN 100 UNIT/ML ~~LOC~~ SUPN
PEN_INJECTOR | SUBCUTANEOUS | 3 refills | Status: DC
Start: 2022-12-23 — End: 2023-05-03

## 2022-12-23 NOTE — Patient Instructions (Addendum)
Please continue: - Metformin 1000 mg 2x a day - NPH 36 units in am and 28-30 units at bedtime   Please return in 4 months with your sugar log.

## 2022-12-23 NOTE — Progress Notes (Signed)
-Patient ID: Tamara [name], female   DOB: 1945-03-12, 77 y.o.   MRN: [id number]  HPI: Tamara [name] is a 77 y.o.-year-old female, returning for follow-up for DM2, dx in 2006, insulin-dependent, uncontrolled, with complications (atherosclerosis) and hypothyroidism Pt. previously saw Dr. Everardo All, but last visit with me 7 months ago. She is the mother of Tamara [name], also my pt.  She is here with her husband.  Interim hx: No increased urination, blurry vision, nausea. She has been on steroids for Covid PNA >> 09-10/2022.  In house, she was septic and CBG increased to 500. She still has SOB.  She had a chest x-ray the other day and this was unchanged.  I read her Dr. Kavin Leech message to her, as she did not get it yet.  At today's visit she wants between activate her MyChart that she does not get the messages through there.  DM2: Reviewed HbA1c: Lab Results  Component Value Date   HGBA1C 8.7 (H) 10/27/2022   HGBA1C 8.8 (H) 09/23/2022   HGBA1C 7.7 (A) 06/10/2022   HGBA1C 7.7 (A) 11/16/2021   HGBA1C 10.1 (A) 08/11/2021   HGBA1C 8.7 (A) 03/26/2021   HGBA1C 7.3 (A) 11/26/2020   HGBA1C 7.4 (A) 07/18/2020   HGBA1C 7.5 (A) 03/19/2020   HGBA1C 8.0 (A) 12/19/2019   HGBA1C 7.4 (A) 09/17/2019   HGBA1C 9.0 (A) 07/17/2019   HGBA1C 11.3 (A) 05/09/2019   HGBA1C 10.8 (H) 03/22/2019   HGBA1C 8.2 (H) 11/22/2018   HGBA1C 9.4 (H) 08/15/2018   HGBA1C 7.1 (H) 04/11/2018   HGBA1C 6.6 (H) 12/07/2017   HGBA1C 6.9 (H) 09/05/2017   HGBA1C 6.6 (H) 05/26/2017   HGBA1C 6.4 (H) 02/23/2017   HGBA1C 5.9 (H) 11/22/2016   HGBA1C 5.4 08/23/2016   Pt is on a regimen of: - Metformin 1000 mg 2x a day  - NPH  40 >> 36 units in am and 24 >> 28-30 units at bedtime She tried metformin in the past but this caused arthralgias, however, she restarted as her CBGs were in the 400 She was previously on insulin in 2017-2018, but came off after losing approximately 40 pounds. Per Dr. George Hugh note, she previously  declined multiple daily insulin injections. We tried [place] 07/2021 >> $$$.  Pt checks her sugars 2x a day and they are: - am: 200-300, 400 >> 97-120s >> 130-169, 183, 187 >> 121-143 >> 82-123, 131 - 2h after b'fast: n/c >>  - before lunch: 111, 133, 137, 151, 157 >> n/c >> 120-167 >> n/c - 2h after lunch: n/c  >> 146- 177 >> 131, 137, 162 >> n/c - before dinner: n/c >> 101, 104 >> n/c >> 110-154 - 2h after dinner: n/c - bedtime: 164 >> n/c - nighttime: n/c Lowest sugar was 97 >> 101 >> 120 >> 82; she has hypoglycemia awareness at 70.  Highest sugar was 400 >> 187 >> 167 >> 500s -steroid.  Glucometer: True Metrix  - no CKD, last BUN/creatinine:  Lab Results  Component Value Date   BUN 5 (L) 10/27/2022   BUN 8 10/17/2022   CREATININE 1.02 (H) 10/27/2022   CREATININE 0.99 10/18/2022   Lab Results  Component Value Date   MICRALBCREAT 7 10/21/2021  She is not on ACE inhibitor/ARB.  -+ HL; last set of lipids: Lab Results  Component Value Date   CHOL 140 03/15/2022   HDL 50 03/15/2022   LDLCALC 65 03/15/2022   LDLDIRECT 64 03/15/2022   TRIG 148 03/15/2022   CHOLHDL 2.8  03/15/2022  On Crestor 20 mg and Zetia 10daily.  - last eye exam was in 09/2021. No DR reportingly. + cataracts.  - no numbness and tingling in her feet.  Last foot exam 06/10/2022.  Reviewed latest imaging: CT chest (08/06/2021): Atherosclerosis of aorta, great vessels, and coronary arteries.  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  Of note, this mass was not FDG avid on PET scan.  Hypothyroidism:  She is on levothyroxine 50 mcg daily: - in am - fasting - at least 30 min from b'fast - no calcium - no iron - no multivitamins - + PPIs - Protonix 30 min later! >> moved >4h later - not on Biotin  Reviewed her latest TSH levels: Lab Results  Component Value Date   TSH 3.33 10/27/2022   TSH 3.34 08/26/2021   TSH 3.88 07/28/2020   TSH 2.24 11/15/2019   TSH 2.45  03/22/2019   TSH 4.62 (H) 11/22/2018   TSH 3.92 05/26/2017   She also has a history of HTN, GERD.  ROS: + see HPI  Past Medical History:  Diagnosis Date   Arthritis    knee and shoulders   Colon polyps 09/05/2017   COPD (chronic obstructive pulmonary disease) (HCC)    Diabetes mellitus without complication (HCC)    type II   Dyspnea    05/16/20 has had a cough for 2.5 years post Covid   Gastritis 09/05/2017   GERD (gastroesophageal reflux disease)    Hyperlipidemia    Hypertension    patient denies   Hypothyroidism    Panic attack    RUQ pain 03/09/2017   Per patient, she has had RUQ pain 4-5 years that has grown worse in the last few months.   Past Surgical History:  Procedure Laterality Date   BREAST BIOPSY Left 2008   CORE W/CLIP - NEG   BRONCHIAL BIOPSY  05/19/2020   Procedure: BRONCHIAL BIOPSIES;  Surgeon: Leslye Peer, MD;  Location: [facility] ENDOSCOPY;  Service: Pulmonary;;   BRONCHIAL BRUSHINGS  05/19/2020   Procedure: BRONCHIAL BRUSHINGS;  Surgeon: Leslye Peer, MD;  Location: [facility] ENDOSCOPY;  Service: Pulmonary;;   BRONCHIAL NEEDLE ASPIRATION BIOPSY  05/19/2020   Procedure: BRONCHIAL NEEDLE ASPIRATION BIOPSIES;  Surgeon: Leslye Peer, MD;  Location[facility] ENDOSCOPY;  Service: Pulmonary;;   BRONCHIAL WASHINGS  05/19/2020   Procedure: BRONCHIAL WASHINGS;  Surgeon: Leslye Peer, MD;  Location: [facility] ENDOSCOPY;  Service: Pulmonary;;   COLONOSCOPY WITH PROPOFOL N/A 02/08/2017   Procedure: COLONOSCOPY WITH PROPOFOL;  Surgeon: Christena Deem, MD;  Location: [facility] ENDOSCOPY;  Service: Endoscopy;  Laterality: N/A;   COLONOSCOPY WITH PROPOFOL N/A 05/05/2022   Procedure: COLONOSCOPY WITH PROPOFOL;  Surgeon: Toney Reil, MD;  Location: [facility] ENDOSCOPY;  Service: Gastroenterology;  Laterality: N/A;   ESOPHAGOGASTRODUODENOSCOPY (EGD) WITH PROPOFOL N/A 02/08/2017   Procedure: ESOPHAGOGASTRODUODENOSCOPY (EGD) WITH PROPOFOL;  Surgeon: Christena Deem, MD;  Location: [facility]  ENDOSCOPY;  Service: Endoscopy;  Laterality: N/A;   TUBAL LIGATION     VIDEO BRONCHOSCOPY WITH ENDOBRONCHIAL NAVIGATION N/A 05/19/2020   Procedure: VIDEO BRONCHOSCOPY WITH ENDOBRONCHIAL NAVIGATION;  Surgeon: Leslye Peer, MD;  Location: [facility] ENDOSCOPY;  Service: Pulmonary;  Laterality: N/A;   Social History   Socioeconomic History   Marital status: Married    Spouse name: Darshay Chirdon   Number of children: 3   Years of education: Not on file   Highest education level: High school graduate  Occupational History   Occupation: retired  Tobacco Use   Smoking status: Former    Current packs/day: 0.00    Average packs/day: 1 pack/day for 40.0 years (40.0 ttl pk-yrs)    Types: Cigarettes    Start date: 73    Quit date: 2006    Years since quitting: 18.9   Smokeless tobacco: Never   Tobacco comments:    Smoking cessation materials not required  Vaping Use   Vaping status: Never Used  Substance and Sexual Activity   Alcohol use: No   Drug use: No   Sexual activity: Not on file  Other Topics Concern   Not on file  Social History Narrative   Not on file   Social Determinants of Health   Financial Resource Strain: Low Risk  (12/02/2022)   Overall Financial Resource Strain (CARDIA)    Difficulty of Paying Living Expenses: Not hard at all  Food Insecurity: No Food Insecurity (12/02/2022)   Hunger Vital Sign    Worried About Running Out of Food in the Last Year: Never true    Ran Out of Food in the Last Year: Never true  Transportation Needs: No Transportation Needs (12/02/2022)   PRAPARE - Administrator, Civil Service (Medical): No    Lack of Transportation (Non-Medical): No  Physical Activity: Insufficiently Active (12/02/2022)   Exercise Vital Sign    Days of Exercise per Week: 3 days    Minutes of Exercise per Session: 30 min  Stress: No Stress Concern Present (12/02/2022)   [employer] of Occupational Health - Occupational Stress Questionnaire     Feeling of Stress : Not at all  Social Connections: Moderately Isolated (12/02/2022)   Social Connection and Isolation Panel [NHANES]    Frequency of Communication with Friends and Family: More than three times a week    Frequency of Social Gatherings with Friends and Family: Once a week    Attends Religious Services: Never    Database administrator or Organizations: No    Attends Banker Meetings: Never    Marital Status: Married  Catering manager Violence: Not At Risk (12/02/2022)   Humiliation, Afraid, Rape, and Kick questionnaire    Fear of Current or Ex-Partner: No    Emotionally Abused: No    Physically Abused: No    Sexually Abused: No   Current Outpatient Medications on File Prior to Visit  Medication Sig Dispense Refill   Accu-Chek Softclix Lancets lancets TEST BLOOD SUGAR TWO TIMES DAILY 200 each 3   albuterol (PROVENTIL) (2.5 MG/3ML) 0.083% nebulizer solution Take 3 mLs (2.5 mg total) by nebulization every 6 (six) hours as needed for wheezing or shortness of breath. 75 mL 5   albuterol (VENTOLIN HFA) 108 (90 Base) MCG/ACT inhaler Inhale 2 puffs into the lungs every 6 (six) hours as needed for wheezing or shortness of breath. 8 g 6   Alcohol Swabs (DROPSAFE ALCOHOL PREP) 70 % PADS USE AS DIRECTED TWICE DAILY WHEN TESTING BLOOD SUGAR 200 each 3   Ascorbic Acid (VITAMIN C) 1000 MG tablet Take 1,000 mg by mouth daily.     aspirin EC 81 MG tablet Take 1 tablet (81 mg total) by mouth daily. Swallow whole. 90 tablet 3   Cholecalciferol (VITAMIN D) 50 MCG (2000 UT) CAPS Take 1,000 Units by mouth daily.     doxycycline (VIBRA-TABS) 100 MG tablet Take 1 tablet (100 mg total) by mouth 2 (two) times daily. 14 tablet 0   doxycycline (VIBRA-TABS) 100 MG tablet Take 1 tablet (  100 mg total) by mouth 2 (two) times daily. 6 tablet 0   DROPLET PEN NEEDLES 32G X 4 MM MISC 1 Device by Other route daily. 200 each 3   ezetimibe (ZETIA) 10 MG tablet Take 1 tablet (10 mg total) by mouth  daily. 90 tablet 3   famotidine (PEPCID) 20 MG tablet Take 1 tablet (20 mg total) by mouth 2 (two) times daily. (Patient not taking: Reported on 11/15/2022) 60 tablet 0   Ferrous Fumarate (HEMOCYTE - 106 MG FE) 324 (106 Fe) MG TABS tablet Take 1 tablet by mouth daily.     Ferrous Fumarate-DSS (FE CAPS/STOOL SOFTENER) 50-100 MG TABS Take 50-100 mg by mouth daily. 30 tablet 3   furosemide (LASIX) 40 MG tablet TAKE 1 TABLET EVERY DAY 90 tablet 1   gabapentin (NEURONTIN) 300 MG capsule Take 300 mg by mouth as needed.     Insulin NPH, Human,, Isophane, (NOVOLIN N FLEXPEN) 100 UNIT/ML Kiwkpen INJECT 36 UNITS UNDER THE SKIN IN THE MORNING AND 28 TO 30 UNITS AT BEDTIME 60 mL 0   levothyroxine (SYNTHROID) 50 MCG tablet TAKE 1 TABLET EVERY DAY BEFORE BREAKFAST 90 tablet 3   metFORMIN (GLUCOPHAGE) 1000 MG tablet Take 1 tablet (1,000 mg total) by mouth 2 (two) times daily. 180 tablet 3   metoCLOPramide (REGLAN) 10 MG tablet Take 1 tablet (10 mg total) by mouth every 6 (six) hours as needed. (Patient not taking: Reported on 12/02/2022) 30 tablet 0   nystatin (MYCOSTATIN/NYSTOP) powder Apply 1 Application topically daily as needed (yeast under belly). 60 g 2   ondansetron (ZOFRAN) 4 MG tablet Take 1 tablet (4 mg total) by mouth every 6 (six) hours as needed for nausea. (Patient not taking: Reported on 12/02/2022) 20 tablet 0   pantoprazole (PROTONIX) 40 MG tablet TAKE 1 TABLET TWICE DAILY 180 tablet 3   potassium chloride (KLOR-CON M) 10 MEQ tablet Take 1 tablet (10 mEq total) by mouth daily. 90 tablet 2   rosuvastatin (CRESTOR) 20 MG tablet Take 1 tablet (20 mg total) by mouth at bedtime. 90 tablet 1   sucralfate (CARAFATE) 1 g tablet Take 1 tablet (1 g total) by mouth 4 (four) times daily. (Patient not taking: Reported on 12/02/2022) 120 tablet 1   Tiotropium Bromide-Olodaterol (STIOLTO RESPIMAT) 2.5-2.5 MCG/ACT AERS Inhale 2 puffs into the lungs daily. 4 g 6   TRUE METRIX BLOOD GLUCOSE TEST test strip Use 2x a  day 200 each 3   valsartan (DIOVAN) 80 MG tablet TAKE 1 TABLET (80 MG TOTAL) BY MOUTH DAILY. 90 tablet 3   No current facility-administered medications on file prior to visit.   Allergies  Allergen Reactions   Penicillins Hives    Tolerated rocephin 09/2022 Reaction: 1965   Family History  Problem Relation Age of Onset   Diabetes Maternal Grandmother    Breast cancer Neg Hx    PE: BP 124/68   Pulse (!) 116   Ht 5\' 6"  (1.676 m)   Wt 222 lb 3.2 oz (100.8 kg)   SpO2 92%   BMI 35.86 kg/m  Wt Readings from Last 3 Encounters:  12/23/22 222 lb 3.2 oz (100.8 kg)  11/15/22 223 lb 6.4 oz (101.3 kg)  10/27/22 224 lb 8 oz (101.8 kg)   Constitutional: overweight, in NAD Eyes: no exophthalmos ENT:  no thyromegaly, no cervical lymphadenopathy Cardiovascular: Tachycardia, RR, No MRG, + B LE edema Respiratory: + Bibasilar wheezes Musculoskeletal: no deformities Skin:  no rashes Neurological: no tremor with outstretched  hands  ASSESSMENT: 1. DM2, insulin-dependent, uncontrolled, with  complications -Aortic atherosclerosis per PET scan from 10/22/2022  2.  Hypothyroidism  3. Hyperlipidemia  PLAN:  1. Patient with onset, uncontrolled, type 2 diabetes, on intermediate acting insulin and metformin.  At last visit, sugars were at or above target in the morning and before lunch and at target after lunch.  She was not checking sugars in the evening and I advised her to do so.  Also, she had relaxed her diet and we discussed about the importance of resuming a low calorie density diet.  In the past, she tried a low-fat low-carb diet and she lost almost 40 pounds and dropped her HbA1c under 6%.  At last visit, HbA1c was lower, at 7.7%, however, she had another HbA1c obtained 2 months ago and this was higher, at 8.7%. -Patient's blood sugar was extremely high in the hospital while on steroids.  However, afterwards, they started to improve and they are now almost entirely at goal.  No need to change  his regimen for now.  I refilled her NPH. - I suggested to:  Patient Instructions  Please continue: - Metformin 1000 mg 2x a day - NPH 36 units in am and 28-30 units at bedtime   Please return in 3-4 months with your sugar log.   - we checked her HbA1c: 7.0% (best in 5 years) - advised to check sugars at different times of the day - 2x a day, rotating check times - advised for yearly eye exams >> she is UTD - will check an ACR today - return to clinic in 3-4 months  2. Hypothyroidism: - latest thyroid labs reviewed with pt. >> normal: Lab Results  Component Value Date   TSH 3.33 10/27/2022  - she continues on LT4 50 mcg daily - pt feels good on this dose. - we discussed about taking the thyroid hormone every day, with water, >30 minutes before breakfast, separated by >4 hours from acid reflux medications, calcium, iron, multivitamins. Pt. is taking it correctly.  3. HL -Reviewed the latest lipid panel from 02/2022: Fractions at goal: Lab Results  Component Value Date   CHOL 140 03/15/2022   HDL 50 03/15/2022   LDLCALC 65 03/15/2022   LDLDIRECT 64 03/15/2022   TRIG 148 03/15/2022   CHOLHDL 2.8 03/15/2022  -She is Crestor 20 mg daily and Zetia 10 mg daily without side effects  Carlus Pavlov, MD PhD [facility] Endocrinology

## 2022-12-23 NOTE — Progress Notes (Signed)
Established Patient Office Visit  Subjective:  Patient ID: Tamara [name], female    DOB: Mar 10, 1945  Age: 77 y.o. MRN: [id number]  CC:  Chief Complaint  Patient presents with   Medical Management of Chronic Issues    HPI Tamara [name] presents for follow up on chronic medical conditions.  Hypertension: -Medications: Valsartan 80 mg, Lasix 40 mg PRN for swelling (taking about once or twice a week).  Complaining of worsening swelling in her bilateral lower extremities today but cannot take Lasix daily due to frequent urination -Had been on hydrochlorothiazide previously but discontinued while in the hospital -Patient is compliant with above medications and reports no side effects. -Checking BP at home (average): doesn't check  -Denies any SOB, CP, vision changes, LE edema or symptoms of hypotension  COPD: -COPD status: worse -Current medications: Stiolto Respimat (at the donut hole until the end of the year), albuterol PRN -Satisfied with current treatment?: yes -Oxygen use: no -Dyspnea frequency: with exertion -Cough frequency: yes daily -Rescue inhaler frequency: daily -Limitation of activity: yes -Productive cough: no -Pneumovax: Up to Date -Influenza: Up to Date -Following with pulmonology, last seen 09/23/2022 -Had a chest x-ray yesterday showing masslike density in the superior left lower lobe, CT scan scheduled for 01/14/2023 for further evaluation.  HLD: -Medications: Crestor 20 mg, Zetia 10 mg - hesitant to increase statin, had not been able to tolerate 40 mg in the past due to joint and muscle pain. -Patient is compliant with above medication. She does have tolerable joint pains with the medication and would prefer not to increase it. -Last lipid panel: Lipid Panel     Component Value Date/Time   CHOL 140 03/15/2022 1351   TRIG 148 03/15/2022 1351   HDL 50 03/15/2022 1351   CHOLHDL 2.8 03/15/2022 1351   CHOLHDL 4.3 08/26/2021 1010   VLDL 29 08/23/2016  0825   LDLCALC 65 03/15/2022 1351   LDLCALC 112 (H) 08/26/2021 1010   LDLDIRECT 64 03/15/2022 1351   LABVLDL 25 03/15/2022 1351   Diabetes, Type 2: -Last A1c 8.7% 10/24 -Medications: Novolin N 36 units in the am, 28-30 units in the pm, Metformin 1000 mg BID -Patient is compliant with the above medications and reports no side effects.  -Following with endocrinology, last seen 12/23/2022 -Statin: yes -PNA vaccine: Prevnar 20 due -Denies symptoms of hypoglycemia, polyuria, polydipsia, numbness extremities, foot ulcers/trauma.   Hypothyroidism: -Medications: Levothyroxine 50 mcg -Patient is compliant with the above medication (s) at the above dose and reports no medication side effects.  -Denies weight changes, cold./heat intolerance, skin changes, anxiety/palpitations  -Last TSH: 3.33 10/24  Health Maintenance: -Blood work UTD -Mammogram 2/20: Birads 1, due -ordered previously -Colonoscopy 4/24, repeat in 3 to 5 years   Past Medical History:  Diagnosis Date   Arthritis    knee and shoulders   Colon polyps 09/05/2017   COPD (chronic obstructive pulmonary disease) (HCC)    Diabetes mellitus without complication (HCC)    type II   Dyspnea    05/16/20 has had a cough for 2.5 years post Covid   Gastritis 09/05/2017   GERD (gastroesophageal reflux disease)    Hyperlipidemia    Hypertension    patient denies   Hypothyroidism    Panic attack    RUQ pain 03/09/2017   Per patient, she has had RUQ pain 4-5 years that has grown worse in the last few months.    Past Surgical History:  Procedure Laterality Date   BREAST BIOPSY  Left 2008   CORE W/CLIP - NEG   BRONCHIAL BIOPSY  05/19/2020   Procedure: BRONCHIAL BIOPSIES;  Surgeon: Leslye Peer, MD;  Location: [facility] ENDOSCOPY;  Service: Pulmonary;;   BRONCHIAL BRUSHINGS  05/19/2020   Procedure: BRONCHIAL BRUSHINGS;  Surgeon: Leslye Peer, MD;  Location: [facility] ENDOSCOPY;  Service: Pulmonary;;   BRONCHIAL NEEDLE ASPIRATION BIOPSY   05/19/2020   Procedure: BRONCHIAL NEEDLE ASPIRATION BIOPSIES;  Surgeon: Leslye Peer, MD;  Location: [facility] ENDOSCOPY;  Service: Pulmonary;;   BRONCHIAL WASHINGS  05/19/2020   Procedure: BRONCHIAL WASHINGS;  Surgeon: Leslye Peer, MD;  Location: [facility] ENDOSCOPY;  Service: Pulmonary;;   COLONOSCOPY WITH PROPOFOL N/A 02/08/2017   Procedure: COLONOSCOPY WITH PROPOFOL;  Surgeon: Christena Deem, MD;  Location: [facility] ENDOSCOPY;  Service: Endoscopy;  Laterality: N/A;   COLONOSCOPY WITH PROPOFOL N/A 05/05/2022   Procedure: COLONOSCOPY WITH PROPOFOL;  Surgeon: Toney Reil, MD;  Location: [facility] ENDOSCOPY;  Service: Gastroenterology;  Laterality: N/A;   ESOPHAGOGASTRODUODENOSCOPY (EGD) WITH PROPOFOL N/A 02/08/2017   Procedure: ESOPHAGOGASTRODUODENOSCOPY (EGD) WITH PROPOFOL;  Surgeon: Christena Deem, MD;  Location: [facility] ENDOSCOPY;  Service: Endoscopy;  Laterality: N/A;   TUBAL LIGATION     VIDEO BRONCHOSCOPY WITH ENDOBRONCHIAL NAVIGATION N/A 05/19/2020   Procedure: VIDEO BRONCHOSCOPY WITH ENDOBRONCHIAL NAVIGATION;  Surgeon: Leslye Peer, MD;  Location: [facility] ENDOSCOPY;  Service: Pulmonary;  Laterality: N/A;    Family History  Problem Relation Age of Onset   Diabetes Maternal Grandmother    Breast cancer Neg Hx     Social History   Socioeconomic History   Marital status: Married    Spouse name: Michaele Dice   Number of children: 3   Years of education: Not on file   Highest education level: High school graduate  Occupational History   Occupation: retired  Tobacco Use   Smoking status: Former    Current packs/day: 0.00    Average packs/day: 1 pack/day for 40.0 years (40.0 ttl pk-yrs)    Types: Cigarettes    Start date: 1966    Quit date: 2006    Years since quitting: 18.9   Smokeless tobacco: Never   Tobacco comments:    Smoking cessation materials not required  Vaping Use   Vaping status: Never Used  Substance and Sexual Activity   Alcohol use: No   Drug use: No   Sexual  activity: Not on file  Other Topics Concern   Not on file  Social History Narrative   Not on file   Social Determinants of Health   Financial Resource Strain: Low Risk  (12/02/2022)   Overall Financial Resource Strain (CARDIA)    Difficulty of Paying Living Expenses: Not hard at all  Food Insecurity: No Food Insecurity (12/02/2022)   Hunger Vital Sign    Worried About Running Out of Food in the Last Year: Never true    Ran Out of Food in the Last Year: Never true  Transportation Needs: No Transportation Needs (12/02/2022)   PRAPARE - Administrator, Civil Service (Medical): No    Lack of Transportation (Non-Medical): No  Physical Activity: Insufficiently Active (12/02/2022)   Exercise Vital Sign    Days of Exercise per Week: 3 days    Minutes of Exercise per Session: 30 min  Stress: No Stress Concern Present (12/02/2022)   [employer] of Occupational Health - Occupational Stress Questionnaire    Feeling of Stress : Not at all  Social Connections: Moderately Isolated (12/02/2022)   Social Connection  and Isolation Panel [NHANES]    Frequency of Communication with Friends and Family: More than three times a week    Frequency of Social Gatherings with Friends and Family: Once a week    Attends Religious Services: Never    Database administrator or Organizations: No    Attends Banker Meetings: Never    Marital Status: Married  Catering manager Violence: Not At Risk (12/02/2022)   Humiliation, Afraid, Rape, and Kick questionnaire    Fear of Current or Ex-Partner: No    Emotionally Abused: No    Physically Abused: No    Sexually Abused: No    Outpatient Medications Prior to Visit  Medication Sig Dispense Refill   albuterol (PROVENTIL) (2.5 MG/3ML) 0.083% nebulizer solution Take 3 mLs (2.5 mg total) by nebulization every 6 (six) hours as needed for wheezing or shortness of breath. 75 mL 5   albuterol (VENTOLIN HFA) 108 (90 Base) MCG/ACT inhaler  Inhale 2 puffs into the lungs every 6 (six) hours as needed for wheezing or shortness of breath. 8 g 6   Ascorbic Acid (VITAMIN C) 1000 MG tablet Take 1,000 mg by mouth daily.     aspirin EC 81 MG tablet Take 1 tablet (81 mg total) by mouth daily. Swallow whole. 90 tablet 3   Cholecalciferol (VITAMIN D) 50 MCG (2000 UT) CAPS Take 1,000 Units by mouth daily.     ezetimibe (ZETIA) 10 MG tablet Take 1 tablet (10 mg total) by mouth daily. 90 tablet 3   famotidine (PEPCID) 20 MG tablet Take 1 tablet (20 mg total) by mouth 2 (two) times daily. 60 tablet 0   Ferrous Fumarate (HEMOCYTE - 106 MG FE) 324 (106 Fe) MG TABS tablet Take 1 tablet by mouth daily.     Ferrous Fumarate-DSS (FE CAPS/STOOL SOFTENER) 50-100 MG TABS Take 50-100 mg by mouth daily. 30 tablet 3   furosemide (LASIX) 40 MG tablet TAKE 1 TABLET EVERY DAY 90 tablet 1   Insulin NPH, Human,, Isophane, (NOVOLIN N FLEXPEN) 100 UNIT/ML Kiwkpen INJECT 36 UNITS UNDER THE SKIN IN THE MORNING AND 28 TO 30 UNITS AT BEDTIME 60 mL 3   levothyroxine (SYNTHROID) 50 MCG tablet TAKE 1 TABLET EVERY DAY BEFORE BREAKFAST 90 tablet 3   metFORMIN (GLUCOPHAGE) 1000 MG tablet Take 1 tablet (1,000 mg total) by mouth 2 (two) times daily. 180 tablet 3   metoCLOPramide (REGLAN) 10 MG tablet Take 1 tablet (10 mg total) by mouth every 6 (six) hours as needed. 30 tablet 0   nystatin (MYCOSTATIN/NYSTOP) powder Apply 1 Application topically daily as needed (yeast under belly). 60 g 2   pantoprazole (PROTONIX) 40 MG tablet TAKE 1 TABLET TWICE DAILY 180 tablet 3   potassium chloride (KLOR-CON M) 10 MEQ tablet Take 1 tablet (10 mEq total) by mouth daily. 90 tablet 2   rosuvastatin (CRESTOR) 20 MG tablet Take 1 tablet (20 mg total) by mouth at bedtime. 90 tablet 1   sucralfate (CARAFATE) 1 g tablet Take 1 tablet (1 g total) by mouth 4 (four) times daily. 120 tablet 1   Tiotropium Bromide-Olodaterol (STIOLTO RESPIMAT) 2.5-2.5 MCG/ACT AERS Inhale 2 puffs into the lungs daily. 4 g  6   valsartan (DIOVAN) 80 MG tablet TAKE 1 TABLET (80 MG TOTAL) BY MOUTH DAILY. 90 tablet 3   Accu-Chek Softclix Lancets lancets TEST BLOOD SUGAR TWO TIMES DAILY (Patient not taking: Reported on 12/24/2022) 200 each 3   Alcohol Swabs (DROPSAFE ALCOHOL PREP) 70 % PADS  USE AS DIRECTED TWICE DAILY WHEN TESTING BLOOD SUGAR (Patient not taking: Reported on 12/24/2022) 200 each 3   doxycycline (VIBRA-TABS) 100 MG tablet Take 1 tablet (100 mg total) by mouth 2 (two) times daily. (Patient not taking: Reported on 12/23/2022) 14 tablet 0   doxycycline (VIBRA-TABS) 100 MG tablet Take 1 tablet (100 mg total) by mouth 2 (two) times daily. (Patient not taking: Reported on 12/23/2022) 6 tablet 0   DROPLET PEN NEEDLES 32G X 4 MM MISC 1 Device by Other route daily. (Patient not taking: Reported on 12/24/2022) 200 each 3   gabapentin (NEURONTIN) 300 MG capsule Take 300 mg by mouth as needed. (Patient not taking: Reported on 12/24/2022)     ondansetron (ZOFRAN) 4 MG tablet Take 1 tablet (4 mg total) by mouth every 6 (six) hours as needed for nausea. (Patient not taking: Reported on 12/24/2022) 20 tablet 0   TRUE METRIX BLOOD GLUCOSE TEST test strip Use 2x a day (Patient not taking: Reported on 12/24/2022) 200 each 3   No facility-administered medications prior to visit.    Allergies  Allergen Reactions   Penicillins Hives    Tolerated rocephin 09/2022 Reaction: 1965    ROS Review of Systems  All other systems reviewed and are negative.     Objective:    Physical Exam Constitutional:      Appearance: Normal appearance.  HENT:     Head: Normocephalic and atraumatic.  Eyes:     Conjunctiva/sclera: Conjunctivae normal.  Cardiovascular:     Rate and Rhythm: Normal rate and regular rhythm.  Pulmonary:     Effort: Pulmonary effort is normal.     Breath sounds: Wheezing present. No rhonchi or rales.  Skin:    General: Skin is warm and dry.  Neurological:     General: No focal deficit present.     Mental  Status: She is alert. Mental status is at baseline.     Cranial Nerves: No cranial nerve deficit.  Psychiatric:        Mood and Affect: Mood normal.        Behavior: Behavior normal.     BP 136/84   Pulse 94   Temp 98.3 F (36.8 C) (Oral)   Resp 16   Ht 5\' 6"  (1.676 m)   Wt 222 lb 8 oz (100.9 kg)   SpO2 97%   BMI 35.91 kg/m  Wt Readings from Last 3 Encounters:  12/24/22 222 lb 8 oz (100.9 kg)  12/23/22 222 lb 3.2 oz (100.8 kg)  11/15/22 223 lb 6.4 oz (101.3 kg)     Health Maintenance Due  Topic Date Due   MAMMOGRAM  03/08/2019   DEXA SCAN  03/07/2020   OPHTHALMOLOGY EXAM  10/07/2022    There are no preventive care reminders to display for this patient.  Lab Results  Component Value Date   TSH 3.33 10/27/2022   Lab Results  Component Value Date   WBC 5.5 10/27/2022   HGB 9.3 (L) 10/27/2022   HCT 30.3 (L) 10/27/2022   MCV 81.5 10/27/2022   PLT 230 10/27/2022   Lab Results  Component Value Date   NA 140 10/27/2022   K 4.8 10/27/2022   CO2 23 10/27/2022   GLUCOSE 170 (H) 10/27/2022   BUN 5 (L) 10/27/2022   CREATININE 1.02 (H) 10/27/2022   BILITOT 0.5 10/27/2022   ALKPHOS 55 10/17/2022   AST 13 10/27/2022   ALT 10 10/27/2022   PROT 6.5 10/27/2022   ALBUMIN 3.0 (  L) 10/17/2022   CALCIUM 9.6 10/27/2022   ANIONGAP 11 10/17/2022   EGFR 57 (L) 10/27/2022   GFR 40.88 (L) 09/14/2022   Lab Results  Component Value Date   CHOL 140 03/15/2022   Lab Results  Component Value Date   HDL 50 03/15/2022   Lab Results  Component Value Date   LDLCALC 65 03/15/2022   Lab Results  Component Value Date   TRIG 148 03/15/2022   Lab Results  Component Value Date   CHOLHDL 2.8 03/15/2022   Lab Results  Component Value Date   HGBA1C 7.0 (A) 12/23/2022      Assessment & Plan:    1. Primary hypertension/Bilateral lower extremity edema: Blood pressure stable, however she is not able to tolerate Lasix daily.  Will continue valsartan 80 mg and add back  hydrochlorothiazide 12.5 mg daily.  Patient can continue to take Lasix 40 mg as needed for about 1-2 times a week for swelling.  Will recheck kidney function at follow-up in 3 months.  - hydrochlorothiazide (MICROZIDE) 12.5 MG capsule; Take 1 capsule (12.5 mg total) by mouth daily.  Dispense: 90 capsule; Refill: 0  2. Coronary artery calcification: Stable on statin and Zetia, refilled.  - ezetimibe (ZETIA) 10 MG tablet; Take 1 tablet (10 mg total) by mouth daily.  Dispense: 90 tablet; Refill: 1  3. 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  Will give sample of Breztri today until the new year.  Did have an abnormal chest x-ray yesterday, has CT scan scheduled for later in the month.  4. Type 2 diabetes mellitus with hyperglycemia, with long-term current use of insulin (HCC): Following with endocrinology, labs and note reviewed from 12/23/2022, A1c now controlled at 7.0%.  Follow-up: Return in about 3 months (around 03/24/2023).    Margarita Mail, DO

## 2022-12-23 NOTE — Addendum Note (Signed)
Addended by: Pollie Meyer on: 12/23/2022 03:14 PM   Modules accepted: Orders

## 2022-12-24 ENCOUNTER — Ambulatory Visit (INDEPENDENT_AMBULATORY_CARE_PROVIDER_SITE_OTHER): Payer: Medicare HMO | Admitting: Internal Medicine

## 2022-12-24 ENCOUNTER — Other Ambulatory Visit: Payer: Self-pay

## 2022-12-24 ENCOUNTER — Encounter: Payer: Self-pay | Admitting: Internal Medicine

## 2022-12-24 VITALS — BP 136/84 | HR 94 | Temp 98.3°F | Resp 16 | Ht 66.0 in | Wt 222.5 lb

## 2022-12-24 DIAGNOSIS — J439 Emphysema, unspecified: Secondary | ICD-10-CM

## 2022-12-24 DIAGNOSIS — I10 Essential (primary) hypertension: Secondary | ICD-10-CM | POA: Diagnosis not present

## 2022-12-24 DIAGNOSIS — J984 Other disorders of lung: Secondary | ICD-10-CM | POA: Diagnosis not present

## 2022-12-24 DIAGNOSIS — I2510 Atherosclerotic heart disease of native coronary artery without angina pectoris: Secondary | ICD-10-CM

## 2022-12-24 DIAGNOSIS — R600 Localized edema: Secondary | ICD-10-CM

## 2022-12-24 DIAGNOSIS — E1165 Type 2 diabetes mellitus with hyperglycemia: Secondary | ICD-10-CM

## 2022-12-24 DIAGNOSIS — Z794 Long term (current) use of insulin: Secondary | ICD-10-CM | POA: Diagnosis not present

## 2022-12-24 LAB — MICROALBUMIN / CREATININE URINE RATIO
Creatinine, Urine: 180 mg/dL (ref 20–275)
Microalb Creat Ratio: 6 mg/g{creat} (ref <30)
Microalb, Ur: 1.1 mg/dL

## 2022-12-24 MED ORDER — EZETIMIBE 10 MG PO TABS
10.0000 mg | ORAL_TABLET | Freq: Every day | ORAL | 1 refills | Status: DC
Start: 2022-12-24 — End: 2023-06-14

## 2022-12-24 MED ORDER — HYDROCHLOROTHIAZIDE 12.5 MG PO CAPS: 12.5000 mg | ORAL_CAPSULE | Freq: Every day | ORAL | 0 refills | Status: DC

## 2022-12-24 NOTE — Telephone Encounter (Signed)
lmtcb

## 2022-12-24 NOTE — Telephone Encounter (Signed)
Lmtcb.

## 2022-12-27 NOTE — Telephone Encounter (Signed)
Pt aware of results cxr. NF

## 2022-12-29 NOTE — Telephone Encounter (Signed)
Pt was already aware of results. Pt states someone called her yesterday. NFN

## 2022-12-31 ENCOUNTER — Ambulatory Visit: Payer: Medicare HMO | Admitting: Emergency Medicine

## 2023-01-14 ENCOUNTER — Ambulatory Visit
Admission: RE | Admit: 2023-01-14 | Discharge: 2023-01-14 | Disposition: A | Discharge status: Home / Self Care | Payer: Medicare HMO | Source: Ambulatory Visit | Attending: Emergency Medicine | Admitting: Emergency Medicine

## 2023-01-14 DIAGNOSIS — J984 Other disorders of lung: Secondary | ICD-10-CM

## 2023-01-14 DIAGNOSIS — R918 Other nonspecific abnormal finding of lung field: Secondary | ICD-10-CM | POA: Diagnosis not present

## 2023-01-14 DIAGNOSIS — I700 Atherosclerosis of aorta: Secondary | ICD-10-CM | POA: Diagnosis not present

## 2023-01-27 ENCOUNTER — Encounter: Payer: Self-pay | Admitting: Emergency Medicine

## 2023-01-27 ENCOUNTER — Ambulatory Visit: Payer: Medicare HMO | Admitting: Emergency Medicine

## 2023-01-27 VITALS — BP 144/70 | HR 112 | Temp 97.7°F | Ht <= 58 in | Wt 227.8 lb

## 2023-01-27 DIAGNOSIS — J984 Other disorders of lung: Secondary | ICD-10-CM

## 2023-01-27 DIAGNOSIS — R9389 Abnormal findings on diagnostic imaging of other specified body structures: Secondary | ICD-10-CM

## 2023-01-27 DIAGNOSIS — J441 Chronic obstructive pulmonary disease with (acute) exacerbation: Secondary | ICD-10-CM

## 2023-01-27 DIAGNOSIS — J449 Chronic obstructive pulmonary disease, unspecified: Secondary | ICD-10-CM

## 2023-01-27 MED ORDER — STIOLTO RESPIMAT 2.5-2.5 MCG/ACT IN AERS: 2.0000 | INHALATION_SPRAY | Freq: Every day | RESPIRATORY_TRACT | Status: DC

## 2023-01-27 MED ORDER — ALBUTEROL SULFATE (2.5 MG/3ML) 0.083% IN NEBU: 2.5000 mg | INHALATION_SOLUTION | Freq: Four times a day (QID) | RESPIRATORY_TRACT | 5 refills | Status: DC | PRN

## 2023-01-27 MED ORDER — STIOLTO RESPIMAT 2.5-2.5 MCG/ACT IN AERS: 2.0000 | INHALATION_SPRAY | Freq: Every day | RESPIRATORY_TRACT | 11 refills | Status: DC

## 2023-01-27 NOTE — Assessment & Plan Note (Signed)
 We need to get you back on your Stiolto.  We will send a refill to your pharmacy and try to give you samples today.  Get back on 2 puffs once daily. Use your albuterol  either 2 puffs or 1 nebulizer treatment up to every 4 hours if needed for shortness of breath, chest tightness, wheezing.  We will refill your albuterol  nebulizer today.

## 2023-01-27 NOTE — Progress Notes (Signed)
 Subjective:    Patient ID: Tamara [name], female    DOB: November 14, 1945, 78 y.o.   MRN: [id number]  HPI  ROV 09/08/2022 --Tamara [name] has a history of COPD, chronic cough, left lower lobe rounded atelectasis that was evaluated by bronchoscopy and PET scan (both reassuring).  We repeated her CT scan of the chest 08/02/2022 as below.  I tried changing her Stiolto to Breztri  last time - she has had increased cough, some chest discomfort since we made that change. She has yellow mucous, no blood. No pred or abx since I last saw her. No fevers, chills, sweats. She has cough w eating. Denies any overt aspiration sx.   CT scan of the chest 08/02/2022 reviewed by me, shows overall similar appearance of masslike consolidation in the posterior left lower lobe 4.7 x 6 cm.  There is a new masslike consolidation and groundglass in the superior segment of the left lower lobe approximately 4.5 x 5.4 cm  ROV 10/13/2022 --78 year old woman whom I have followed for her COPD, chronic cough and an area of left lower lobe rounded atelectasis that had been reassuring on bronchoscopy and PET scan.  We repeated her CT chest 08/02/2022 and there was a new left lower lobe superior segmental consolidation 4.5 x 5.4 cm.  She was subsequently admitted and treated for COVID-19 and left lower lobe cavitary pneumonia.  Swallowing evaluation during that hospitalization showed a normal oropharyngeal swallow, possible esophageal dysphagia with some residual esophageal. She has severe reflux on PPI.  She was discharged on 09/30/2022 and we performed a PET scan 10/11/2022 as below. Also discharged on home O2, 2L/min at all times. She is using albuterol  nebs prn for SOB, doesn;t really help her. She is on Stiolto, unsure whether this helps her either. Still has some cough less productive. She has persistent nausea  PET scan 10/11/2022 reviewed by me, shows persistent left lower lobe rounded lesion, more superior left lower lobe focal  opacity/infiltrate with little to no hypermetabolic activity, consistent with resolving pneumonia as opposed to malignancy.  ROV 01/27/2023 --follow-up visit 78 year old woman with COPD, chronic cough and abnormal CT scan of the chest.  She has an area of left lower lobe rounded atelectasis that was reassuring on bronchoscopy, then had another left lower lobe cavitary pneumonia.  I repeated a PET scan after she was discharged from the hospital 09/2022 and there was no hypermetabolic activity in the new left lower lobe opacity, consistent with a resolving pneumonia.  She had a reassuring swallow evaluation during that hospitalization.  She was sick in November and was treated again with doxycycline . She feels better, is doing well with less cough or SOB. She is on Stiolto, but ran out in October, has not restarted it. Uses albuterol  about 2-3x a day.   CT chest 01/14/2023 reviewed by me shows a significant interval decrease in the diffuse bilateral peribronchovascular opacities compared with prior, some patchy areas of bronchiectasis, no honeycomb change.  The left lower lobe rounded masslike opacity is again unchanged  Review of Systems As per HPI     Objective:   Physical Exam Vitals:   01/27/23 1456  BP: (!) 144/70  Pulse: (!) 112  Temp: 97.7 F (36.5 C)  TempSrc: Oral  SpO2: 96%  Weight: 227 lb 12.8 oz (103.3 kg)  Height: 4' 8 (1.422 m)   Gen: Pleasant, overweight woman, in no distress,  normal affect  ENT: No lesions,  mouth clear,  oropharynx clear, no postnasal drip  Neck: No JVD, no stridor  Lungs: No use of accessory muscles, bilateral inspiratory squeaks, louder on the left  Cardiovascular: RRR, heart sounds normal, no murmur or gallops, no peripheral edem  Musculoskeletal: No deformities, no cyanosis or clubbing  Neuro: alert, awake, non focal  Skin: Warm, no lesions or rash     Assessment & Plan:  Abnormal CT of the chest With persistent area of rounded atelectasis  at the left base.  There is been some improvement of bilateral scattered inflammatory change and the previously noted left lower lobe cavitary lesion.  Some question as to whether this may be a noninfectious inflammatory process, consider COP, although on every occasion when she has had new CT findings there has been infectious prodrome and she has responded to antibiotics.  No evidence of aspiration on her modified barium swallow.  Plan to follow her clinically, repeat her CT scan of the chest in June     COPD (chronic obstructive pulmonary disease) (HCC) We need to get you back on your Stiolto.  We will send a refill to your pharmacy and try to give you samples today.  Get back on 2 puffs once daily. Use your albuterol  either 2 puffs or 1 nebulizer treatment up to every 4 hours if needed for shortness of breath, chest tightness, wheezing.  We will refill your albuterol  nebulizer today.      Lamar Chris, MD, PhD 01/27/2023, 5:22 PM [facility] [phone] or if no answer before 7:00PM call [phone] For any issues after 7:00PM please call eLink [phone]

## 2023-01-27 NOTE — Patient Instructions (Signed)
 We reviewed your CT scan of the chest today.  The inflammation has improved but you still have scarring leftover from prior pneumonias. We need to get you back on your Stiolto.  We will send a refill to your pharmacy and try to give you samples today.  Get back on 2 puffs once daily. Use your albuterol  either 2 puffs or 1 nebulizer treatment up to every 4 hours if needed for shortness of breath, chest tightness, wheezing.  We will refill your albuterol  nebulizer today. We will plan to repeat your CT scan of the chest in June 2025 Follow-up with APP in 3 months Follow Dr. Shelah in June 2025 after your CT chest so we can review those results together

## 2023-01-27 NOTE — Assessment & Plan Note (Signed)
 With persistent area of rounded atelectasis at the left base.  There is been some improvement of bilateral scattered inflammatory change and the previously noted left lower lobe cavitary lesion.  Some question as to whether this may be a noninfectious inflammatory process, consider COP, although on every occasion when she has had new CT findings there has been infectious prodrome and she has responded to antibiotics.  No evidence of aspiration on her modified barium swallow.  Plan to follow her clinically, repeat her CT scan of the chest in June

## 2023-03-02 ENCOUNTER — Other Ambulatory Visit: Payer: Self-pay

## 2023-03-02 ENCOUNTER — Other Ambulatory Visit: Payer: Self-pay | Admitting: Nurse Practitioner

## 2023-03-02 DIAGNOSIS — Z79899 Other long term (current) drug therapy: Secondary | ICD-10-CM

## 2023-03-02 DIAGNOSIS — I10 Essential (primary) hypertension: Secondary | ICD-10-CM

## 2023-03-02 DIAGNOSIS — E782 Mixed hyperlipidemia: Secondary | ICD-10-CM

## 2023-03-02 MED ORDER — POTASSIUM CHLORIDE CRYS ER 10 MEQ PO TBCR: 10.0000 meq | EXTENDED_RELEASE_TABLET | Freq: Every day | ORAL | 2 refills | Status: DC
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
## 2023-03-11 ENCOUNTER — Other Ambulatory Visit: Payer: Self-pay | Admitting: Internal Medicine

## 2023-03-11 DIAGNOSIS — I10 Essential (primary) hypertension: Secondary | ICD-10-CM
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
## 2023-03-24 ENCOUNTER — Ambulatory Visit: Payer: Medicare HMO | Admitting: Internal Medicine

## 2023-03-25 ENCOUNTER — Ambulatory Visit: Payer: Medicare HMO | Admitting: Internal Medicine

## 2023-03-30 ENCOUNTER — Other Ambulatory Visit: Payer: Self-pay | Admitting: Internal Medicine

## 2023-04-02 ENCOUNTER — Other Ambulatory Visit: Payer: Self-pay | Admitting: Internal Medicine

## 2023-04-04 ENCOUNTER — Telehealth: Payer: Self-pay | Admitting: Emergency Medicine

## 2023-04-04 NOTE — Telephone Encounter (Signed)
 Error

## 2023-04-05 ENCOUNTER — Encounter: Payer: Self-pay | Admitting: Pulmonary Disease

## 2023-04-05 ENCOUNTER — Ambulatory Visit

## 2023-04-05 ENCOUNTER — Ambulatory Visit: Admitting: Pulmonary Disease

## 2023-04-05 VITALS — BP 116/59 | HR 108 | Ht 66.0 in | Wt 220.2 lb

## 2023-04-05 DIAGNOSIS — J441 Chronic obstructive pulmonary disease with (acute) exacerbation: Secondary | ICD-10-CM

## 2023-04-05 DIAGNOSIS — R0609 Other forms of dyspnea: Secondary | ICD-10-CM

## 2023-04-05 DIAGNOSIS — R052 Subacute cough: Secondary | ICD-10-CM

## 2023-04-05 DIAGNOSIS — R0989 Other specified symptoms and signs involving the circulatory and respiratory systems: Secondary | ICD-10-CM | POA: Diagnosis not present

## 2023-04-05 DIAGNOSIS — J181 Lobar pneumonia, unspecified organism: Secondary | ICD-10-CM

## 2023-04-05 MED ORDER — LEVOFLOXACIN 500 MG PO TABS: 500.0000 mg | ORAL_TABLET | Freq: Every day | ORAL | 0 refills | Status: DC

## 2023-04-05 MED ORDER — PREDNISONE 20 MG PO TABS: ORAL_TABLET | ORAL | 0 refills | Status: AC

## 2023-04-05 NOTE — Patient Instructions (Addendum)
 Nice to see you  Based on your symptoms on board about pneumonia again.  The chest x-ray confirms pneumonia particular in the left side.  I see no extra fluid or anything else which is good news.  Take levofloxacin 1 tablet every day for 7 days  Take prednisone 40 mg for 5 days and 20 mg for 5-day.  If you get worse or things or not improving in the next few days that only course of action I could recommend would be going to the emergency room for IV antibiotics and repeat imaging and assessment.  Hopefully we will not need to do this.  Return to clinic on previously scheduled appointment/10/2023 with [facility]

## 2023-04-05 NOTE — Progress Notes (Signed)
 @Patient  ID: Tamara [name], female    DOB: 1945-08-23, 78 y.o.   MRN: [id number]  Chief Complaint  Patient presents with   Acute Visit    Pt states having lots of SOB, has green sputum, chest pain w/ breathing    Referring provider: Margarita Mail, DO  HPI:   78 y.o. woman history of COPD and migratory infiltrates here with acute visit for 10 days of cough, congestion, productive sputum, worsening shortness of breath, pleuritic chest pain.  Notes onset of symptoms 7 to 10 days ago.  Started with sinus drainage increase sinus drainage.  No sore throat or fever.  Worsening cough with congestion.  Productive sputum.  Sputum now changed to green.  Discolored over time.  Oxygen at home.  Using it more.  Only use that needed historically.  Says she does not need it.  Lowest oxygen saturation 85% reportedly.  Shortness of breath worsened with pleuritic chest pain.  Bilaterally.  More so on the right and severity of the bilateral.  On exam she has rhonchorous sounds on expiration on bilateral bases.   Questionaires / Pulmonary Flowsheets:   ACT:      No data to display          MMRC: mMRC Dyspnea Scale mMRC Score  04/08/2020  2:10 PM 2    Epworth:      No data to display          Tests:   FENO:  No results found for: "NITRICOXIDE"  PFT:    Latest Ref Rng & Units 08/18/2021    9:55 AM  PFT Results  FVC-Pre L 2.27   FVC-Predicted Pre % 74   FVC-Post L 2.28   FVC-Predicted Post % 75   Pre FEV1/FVC % % 84   Post FEV1/FCV % % 85   FEV1-Pre L 1.91   FEV1-Predicted Pre % 83   FEV1-Post L 1.94   DLCO uncorrected ml/min/mmHg 14.64   DLCO UNC% % 72   DLCO corrected ml/min/mmHg 16.16   DLCO COR %Predicted % 80   DLVA Predicted % 102   TLC L 4.19   TLC % Predicted % 79   RV % Predicted % 78     WALK:     10/13/2022   11:36 AM  SIX MIN WALK  Supplimental Oxygen during Test? (L/min) No  Tech Comments: stopped to rest x 1 due to SOB and fatigue/lmr     Imaging: No results found.  Lab Results:  CBC    Component Value Date/Time   WBC 5.5 10/27/2022 1106   RBC 3.72 (L) 10/27/2022 1106   HGB 9.3 (L) 10/27/2022 1106   HCT 30.3 (L) 10/27/2022 1106   PLT 230 10/27/2022 1106   MCV 81.5 10/27/2022 1106   MCH 25.0 (L) 10/27/2022 1106   MCHC 30.7 (L) 10/27/2022 1106   RDW 15.8 (H) 10/27/2022 1106   LYMPHSABS 1,128 10/27/2022 1106   MONOABS 0.2 09/23/2022 1159   EOSABS 281 10/27/2022 1106   BASOSABS 50 10/27/2022 1106    BMET    Component Value Date/Time   NA 140 10/27/2022 1106   NA 140 05/27/2022 1151   K 4.8 10/27/2022 1106   CL 106 10/27/2022 1106   CO2 23 10/27/2022 1106   GLUCOSE 170 (H) 10/27/2022 1106   BUN 5 (L) 10/27/2022 1106   BUN 17 05/27/2022 1151   CREATININE 1.02 (H) 10/27/2022 1106   CALCIUM 9.6 10/27/2022 1106   GFRNONAA 59 (L) 10/18/2022 0401  GFRNONAA 63 11/15/2019 1523   GFRAA 73 11/15/2019 1523    BNP    Component Value Date/Time   BNP 33.7 09/29/2022 0621   BNP 16 10/21/2021 1431    ProBNP No results found for: "PROBNP"  Specialty Problems       Pulmonary Problems   Chronic cough   COPD (chronic obstructive pulmonary disease) (HCC)   Pneumonia due to COVID-19 virus   Chronic hypoxic respiratory failure (HCC)   Acute on chronic respiratory failure with hypoxia (HCC)   Multifocal pneumonia   OSA (obstructive sleep apnea)    Allergies  Allergen Reactions   Penicillins Hives    Tolerated rocephin 09/2022 Reaction: 1965    Immunization History  Administered Date(s) Administered   Fluad Quad(high Dose 65+) 09/20/2018, 10/16/2019, 10/16/2020   Influenza, High Dose Seasonal PF 10/16/2019   Influenza,inj,Quad PF,6+ Mos 10/18/2016, 09/29/2017, 10/20/2021   Pneumococcal Conjugate-13 10/16/2019   Pneumococcal Polysaccharide-23 03/26/2013    Past Medical History:  Diagnosis Date   Arthritis    knee and shoulders   Colon polyps 09/05/2017   COPD (chronic obstructive pulmonary  disease) (HCC)    Diabetes mellitus without complication (HCC)    type II   Dyspnea    05/16/20 has had a cough for 2.5 years post Covid   Gastritis 09/05/2017   GERD (gastroesophageal reflux disease)    Hyperlipidemia    Hypertension    patient denies   Hypothyroidism    Panic attack    RUQ pain 03/09/2017   Per patient, she has had RUQ pain 4-5 years that has grown worse in the last few months.    Tobacco History: Social History   Tobacco Use  Smoking Status Former   Current packs/day: 0.00   Average packs/day: 1 pack/day for 40.0 years (40.0 ttl pk-yrs)   Types: Cigarettes   Start date: 1966   Quit date: 2006   Years since quitting: 19.2  Smokeless Tobacco Never  Tobacco Comments   Smoking cessation materials not required   Counseling given: Not Answered Tobacco comments: Smoking cessation materials not required   Continue to not smoke  Outpatient Encounter Medications as of 04/05/2023  Medication Sig   Accu-Chek Softclix Lancets lancets TEST BLOOD SUGAR TWO TIMES DAILY   albuterol (PROVENTIL) (2.5 MG/3ML) 0.083% nebulizer solution Take 3 mLs (2.5 mg total) by nebulization every 6 (six) hours as needed for wheezing or shortness of breath.   albuterol (VENTOLIN HFA) 108 (90 Base) MCG/ACT inhaler Inhale 2 puffs into the lungs every 6 (six) hours as needed for wheezing or shortness of breath.   Ascorbic Acid (VITAMIN C) 1000 MG tablet Take 1,000 mg by mouth daily.   aspirin EC 81 MG tablet Take 1 tablet (81 mg total) by mouth daily. Swallow whole.   Blood Glucose Monitoring Suppl (TRUE METRIX METER) w/Device KIT TEST BLOOD SUGAR TWICE DAILY   Cholecalciferol (VITAMIN D) 50 MCG (2000 UT) CAPS Take 1,000 Units by mouth daily.   DROPLET PEN NEEDLES 32G X 4 MM MISC 1 Device by Other route daily.   ezetimibe (ZETIA) 10 MG tablet Take 1 tablet (10 mg total) by mouth daily.   famotidine (PEPCID) 20 MG tablet Take 1 tablet (20 mg total) by mouth 2 (two) times daily.   Ferrous  Fumarate (HEMOCYTE - 106 MG FE) 324 (106 Fe) MG TABS tablet Take 1 tablet by mouth daily.   Ferrous Fumarate-DSS (FE CAPS/STOOL SOFTENER) 50-100 MG TABS Take 50-100 mg by mouth daily.   furosemide (  LASIX) 40 MG tablet TAKE 1 TABLET EVERY DAY   hydrochlorothiazide (MICROZIDE) 12.5 MG capsule TAKE 1 CAPSULE EVERY DAY   Insulin NPH, Human,, Isophane, (NOVOLIN N FLEXPEN) 100 UNIT/ML Kiwkpen INJECT 36 UNITS UNDER THE SKIN IN THE MORNING AND 28 TO 30 UNITS AT BEDTIME   levothyroxine (SYNTHROID) 50 MCG tablet TAKE 1 TABLET EVERY DAY BEFORE BREAKFAST   metFORMIN (GLUCOPHAGE) 1000 MG tablet Take 1 tablet (1,000 mg total) by mouth 2 (two) times daily.   metoCLOPramide (REGLAN) 10 MG tablet Take 1 tablet (10 mg total) by mouth every 6 (six) hours as needed.   nystatin (MYCOSTATIN/NYSTOP) powder Apply 1 Application topically daily as needed (yeast under belly).   ondansetron (ZOFRAN) 4 MG tablet Take 1 tablet (4 mg total) by mouth every 6 (six) hours as needed for nausea.   pantoprazole (PROTONIX) 40 MG tablet TAKE 1 TABLET TWICE DAILY   potassium chloride (KLOR-CON M) 10 MEQ tablet Take 1 tablet (10 mEq total) by mouth daily.   rosuvastatin (CRESTOR) 20 MG tablet Take 1 tablet (20 mg total) by mouth at bedtime.   sucralfate (CARAFATE) 1 g tablet Take 1 tablet (1 g total) by mouth 4 (four) times daily.   Tiotropium Bromide-Olodaterol (STIOLTO RESPIMAT) 2.5-2.5 MCG/ACT AERS Inhale 2 puffs into the lungs daily.   Tiotropium Bromide-Olodaterol (STIOLTO RESPIMAT) 2.5-2.5 MCG/ACT AERS Inhale 2 puffs into the lungs daily.   TRUE METRIX BLOOD GLUCOSE TEST test strip Use 2x a day   valsartan (DIOVAN) 80 MG tablet TAKE 1 TABLET (80 MG TOTAL) BY MOUTH DAILY.   No facility-administered encounter medications on file as of 04/05/2023.     Review of Systems  Review of Systems  No orthopnea or PND.  Comprehensive review of systems otherwise negative. Physical Exam  BP (!) 116/59 (BP Location: Left Arm, Patient  Position: Sitting, Cuff Size: Normal)   Pulse (!) 108   Ht 5\' 6"  (1.676 m)   Wt 220 lb 3.2 oz (99.9 kg)   SpO2 92%   BMI 35.54 kg/m   Wt Readings from Last 5 Encounters:  04/05/23 220 lb 3.2 oz (99.9 kg)  01/27/23 227 lb 12.8 oz (103.3 kg)  12/24/22 222 lb 8 oz (100.9 kg)  12/23/22 222 lb 3.2 oz (100.8 kg)  11/15/22 223 lb 6.4 oz (101.3 kg)    BMI Readings from Last 5 Encounters:  04/05/23 35.54 kg/m  01/27/23 51.07 kg/m  12/24/22 35.91 kg/m  12/23/22 35.86 kg/m  11/15/22 36.06 kg/m     Physical Exam General: Sitting upright in chair, no acute distress Eyes: EOMI, no icterus Neck: Supple, no JVP Pulmonary: Rhonchorous inspiratory sounds in bilateral bases improved to midlung fields are absent in the upper lung fields, no wheezing I can hear Cardiovascular: Warm, no edema Abdomen: Nondistended MSK: No synovitis, no joint effusion Neuro: No focal deficits, exits exam room in wheelchair Psych: Normal mood, full affect  Assessment & Plan:   COPD with acute exacerbation: waxing and waning inflammatory infiltrates in past. Historically, symptoms improve with abx.  7 to 10 days of cough with congestion of discolored sputum, worsened shortness of breath, pleuritic chest pain.  Lung exam is rhonchorous, concern for mucus impaction versus pneumonia.  Chest x-ray today.  This reveals left-sided lobar pneumonia.  Prednisone taper, levofloxacin for 7 days sent to local pharmacy.  Return precautions discussed, if not improving or worsening in the coming days she must go to the emergency room for failure of outpatient management.  Discussed in detail.  No follow-ups on file.  Encouraged to keep upcoming pulmonary appointment/10/2023 with Jeanmarie Plant,  NP.   Karren Burly, MD 04/05/2023   This appointment required 40 minutes of patient care (this includes precharting, chart review, review of results, face-to-face care, etc.).

## 2023-04-11 ENCOUNTER — Emergency Department (HOSPITAL_COMMUNITY)

## 2023-04-11 ENCOUNTER — Encounter (HOSPITAL_COMMUNITY): Payer: Self-pay | Admitting: Radiology

## 2023-04-11 ENCOUNTER — Emergency Department (HOSPITAL_COMMUNITY)
Admission: EM | Admit: 2023-04-11 | Discharge: 2023-04-11 | Disposition: A | Discharge status: Home / Self Care | Attending: Emergency Medicine | Admitting: Emergency Medicine

## 2023-04-11 ENCOUNTER — Ambulatory Visit: Payer: Self-pay | Admitting: Emergency Medicine

## 2023-04-11 ENCOUNTER — Other Ambulatory Visit: Payer: Self-pay

## 2023-04-11 DIAGNOSIS — J181 Lobar pneumonia, unspecified organism: Secondary | ICD-10-CM | POA: Insufficient documentation

## 2023-04-11 DIAGNOSIS — D72829 Elevated white blood cell count, unspecified: Secondary | ICD-10-CM | POA: Diagnosis not present

## 2023-04-11 DIAGNOSIS — Z7982 Long term (current) use of aspirin: Secondary | ICD-10-CM | POA: Insufficient documentation

## 2023-04-11 DIAGNOSIS — J189 Pneumonia, unspecified organism: Secondary | ICD-10-CM

## 2023-04-11 DIAGNOSIS — R0602 Shortness of breath: Secondary | ICD-10-CM | POA: Diagnosis present

## 2023-04-11 LAB — CBC
HCT: 31.7 % — ABNORMAL LOW (ref 36.0–46.0)
Hemoglobin: 10.2 g/dL — ABNORMAL LOW (ref 12.0–15.0)
MCH: 25.3 pg — ABNORMAL LOW (ref 26.0–34.0)
MCHC: 32.2 g/dL (ref 30.0–36.0)
MCV: 78.7 fL — ABNORMAL LOW (ref 80.0–100.0)
Platelets: 216 10*3/uL (ref 150–400)
RBC: 4.03 MIL/uL (ref 3.87–5.11)
RDW: 15.9 % — ABNORMAL HIGH (ref 11.5–15.5)
WBC: 11.9 10*3/uL — ABNORMAL HIGH (ref 4.0–10.5)
nRBC: 0 % (ref 0.0–0.2)

## 2023-04-11 LAB — BASIC METABOLIC PANEL
Anion gap: 14 (ref 5–15)
BUN: 26 mg/dL — ABNORMAL HIGH (ref 8–23)
CO2: 20 mmol/L — ABNORMAL LOW (ref 22–32)
Calcium: 10.4 mg/dL — ABNORMAL HIGH (ref 8.9–10.3)
Chloride: 101 mmol/L (ref 98–111)
Creatinine, Ser: 1.28 mg/dL — ABNORMAL HIGH (ref 0.44–1.00)
GFR, Estimated: 43 mL/min — ABNORMAL LOW (ref >60)
Glucose, Bld: 167 mg/dL — ABNORMAL HIGH (ref 70–99)
Potassium: 4.6 mmol/L (ref 3.5–5.1)
Sodium: 135 mmol/L (ref 135–145)

## 2023-04-11 LAB — LACTIC ACID, PLASMA: Lactic Acid, Venous: 2.4 mmol/L (ref 0.5–1.9)

## 2023-04-11 LAB — TROPONIN I (HIGH SENSITIVITY)
Troponin I (High Sensitivity): 4 ng/L (ref <18)
Troponin I (High Sensitivity): 5 ng/L (ref <18)

## 2023-04-11 MED ORDER — ALBUTEROL SULFATE HFA 108 (90 BASE) MCG/ACT IN AERS: 2.0000 | INHALATION_SPRAY | RESPIRATORY_TRACT | Status: DC | PRN

## 2023-04-11 NOTE — ED Provider Notes (Signed)
 [place] EMERGENCY DEPARTMENT AT [facility] Provider Note   CSN: [id number] Arrival date & time: 04/11/23  1652     History  Chief Complaint  Patient presents with   Chest Pain   Shortness of Breath    Tamara [name] is a 78 y.o. female.  78 yo F with a chief complaint of difficulty breathing.  The patient was recently diagnosed with pneumonia.  Her doctor had started her on antibiotics and steroids.  She actually feels like she is doing a bit better now but she had checked her oxygen level at home and it was low she had called her doctor and they told her to come to the ED for evaluation.  She tells me that she checked the oxygen level of her husband's well is not having any symptoms and it also was in the 70s.   Chest Pain Associated symptoms: shortness of breath   Shortness of Breath Associated symptoms: chest pain        Home Medications Prior to Admission medications   Medication Sig Start Date End Date Taking? Authorizing Provider  Accu-Chek Softclix Lancets lancets TEST BLOOD SUGAR TWO TIMES DAILY 07/28/22   Margarita Mail, DO  albuterol (PROVENTIL) (2.5 MG/3ML) 0.083% nebulizer solution Take 3 mLs (2.5 mg total) by nebulization every 6 (six) hours as needed for wheezing or shortness of breath. 01/27/23 01/27/24  Leslye Peer, MD  albuterol (VENTOLIN HFA) 108 (90 Base) MCG/ACT inhaler Inhale 2 puffs into the lungs every 6 (six) hours as needed for wheezing or shortness of breath. 09/17/20   Leslye Peer, MD  Ascorbic Acid (VITAMIN C) 1000 MG tablet Take 1,000 mg by mouth daily.    [provider]  aspirin EC 81 MG tablet Take 1 tablet (81 mg total) by mouth daily. Swallow whole. 05/18/22   Meriam Sprague, MD  Blood Glucose Monitoring Suppl (TRUE METRIX METER) w/Device KIT TEST BLOOD SUGAR TWICE DAILY 03/30/23   Carlus Pavlov, MD  Cholecalciferol (VITAMIN D) 50 MCG (2000 UT) CAPS Take 1,000 Units by mouth daily.    [provider]  DROPLET PEN NEEDLES 32G X 4 MM MISC USE AS DIRECTED 04/06/23   Carlus Pavlov, MD  ezetimibe (ZETIA) 10 MG tablet Take 1 tablet (10 mg total) by mouth daily. 12/24/22   Margarita Mail, DO  famotidine (PEPCID) 20 MG tablet Take 1 tablet (20 mg total) by mouth 2 (two) times daily. 10/18/22   Sharman Cheek, MD  Ferrous Fumarate (HEMOCYTE - 106 MG FE) 324 (106 Fe) MG TABS tablet Take 1 tablet by mouth daily. 10/29/22   [provider]  Ferrous Fumarate-DSS (FE CAPS/STOOL SOFTENER) 50-100 MG TABS Take 50-100 mg by mouth daily. 10/29/22   Margarita Mail, DO  furosemide (LASIX) 40 MG tablet TAKE 1 TABLET EVERY DAY 11/15/22   Margarita Mail, DO  hydrochlorothiazide (MICROZIDE) 12.5 MG capsule TAKE 1 CAPSULE EVERY DAY 03/14/23   Margarita Mail, DO  Insulin NPH, Human,, Isophane, (NOVOLIN N FLEXPEN) 100 UNIT/ML Kiwkpen INJECT 36 UNITS UNDER THE SKIN IN THE MORNING AND 28 TO 30 UNITS AT BEDTIME 12/23/22   Carlus Pavlov, MD  levofloxacin (LEVAQUIN) 500 MG tablet Take 1 tablet (500 mg total) by mouth daily. 04/05/23   Hunsucker, Lesia Sago, MD  levothyroxine (SYNTHROID) 50 MCG tablet TAKE 1 TABLET EVERY DAY BEFORE BREAKFAST 12/03/22   Margarita Mail, DO  metFORMIN (GLUCOPHAGE) 1000 MG tablet Take 1 tablet (1,000 mg total) by mouth 2 (two) times daily.  06/10/22   Carlus Pavlov, MD  metoCLOPramide (REGLAN) 10 MG tablet Take 1 tablet (10 mg total) by mouth every 6 (six) hours as needed. 10/18/22   Sharman Cheek, MD  nystatin (MYCOSTATIN/NYSTOP) powder Apply 1 Application topically daily as needed (yeast under belly). 03/01/22   Margarita Mail, DO  ondansetron (ZOFRAN) 4 MG tablet Take 1 tablet (4 mg total) by mouth every 6 (six) hours as needed for nausea. 10/18/22   Darlin Priestly, MD  pantoprazole (PROTONIX) 40 MG tablet TAKE 1 TABLET TWICE DAILY 10/06/22   Leslye Peer, MD  potassium chloride (KLOR-CON M) 10 MEQ tablet Take 1 tablet (10 mEq total) by mouth  daily. 03/02/23   Nahser, Deloris Ping, MD  predniSONE (DELTASONE) 20 MG tablet Take 2 tablets (40 mg total) by mouth daily with breakfast for 5 days, THEN 1 tablet (20 mg total) daily with breakfast for 5 days. 04/05/23 04/15/23  Hunsucker, Lesia Sago, MD  rosuvastatin (CRESTOR) 20 MG tablet Take 1 tablet (20 mg total) by mouth at bedtime. 10/27/22   Margarita Mail, DO  sucralfate (CARAFATE) 1 g tablet Take 1 tablet (1 g total) by mouth 4 (four) times daily. 10/18/22   Sharman Cheek, MD  Tiotropium Bromide-Olodaterol (STIOLTO RESPIMAT) 2.5-2.5 MCG/ACT AERS Inhale 2 puffs into the lungs daily. 01/27/23   Leslye Peer, MD  Tiotropium Bromide-Olodaterol (STIOLTO RESPIMAT) 2.5-2.5 MCG/ACT AERS Inhale 2 puffs into the lungs daily. 01/27/23   Leslye Peer, MD  TRUE METRIX BLOOD GLUCOSE TEST test strip Use 2x a day 06/10/22   Carlus Pavlov, MD  valsartan (DIOVAN) 80 MG tablet TAKE 1 TABLET (80 MG TOTAL) BY MOUTH DAILY. 05/12/22   Berniece Salines, FNP      Allergies    Penicillins    Review of Systems   Review of Systems  Respiratory:  Positive for shortness of breath.   Cardiovascular:  Positive for chest pain.    Physical Exam Updated Vital Signs BP 128/70 (BP Location: Right Arm)   Pulse (!) 103   Temp 98.5 F (36.9 C)   Resp 19   Ht 5\' 6"  (1.676 m)   Wt 100.2 kg   SpO2 96%   BMI 35.67 kg/m  Physical Exam Vitals and nursing note reviewed.  Constitutional:      General: She is not in acute distress.    Appearance: She is well-developed. She is not diaphoretic.  HENT:     Head: Normocephalic and atraumatic.  Eyes:     Pupils: Pupils are equal, round, and reactive to light.  Cardiovascular:     Rate and Rhythm: Normal rate and regular rhythm.     Heart sounds: No murmur heard.    No friction rub. No gallop.  Pulmonary:     Effort: Pulmonary effort is normal.     Breath sounds: Examination of the left-lower field reveals rhonchi. Rhonchi present. No wheezing or rales.   Abdominal:     General: There is no distension.     Palpations: Abdomen is soft.     Tenderness: There is no abdominal tenderness.  Musculoskeletal:        General: No tenderness.     Cervical back: Normal range of motion and neck supple.  Skin:    General: Skin is warm and dry.  Neurological:     Mental Status: She is alert and oriented to person, place, and time.  Psychiatric:        Behavior: Behavior normal.     ED  Results / Procedures / Treatments   Labs (all labs ordered are listed, but only abnormal results are displayed) Labs Reviewed  BASIC METABOLIC PANEL - Abnormal; Notable for the following components:      Result Value   CO2 20 (*)    Glucose, Bld 167 (*)    BUN 26 (*)    Creatinine, Ser 1.28 (*)    Calcium 10.4 (*)    GFR, Estimated 43 (*)    All other components within normal limits  CBC - Abnormal; Notable for the following components:   WBC 11.9 (*)    Hemoglobin 10.2 (*)    HCT 31.7 (*)    MCV 78.7 (*)    MCH 25.3 (*)    RDW 15.9 (*)    All other components within normal limits  LACTIC ACID, PLASMA  LACTIC ACID, PLASMA  TROPONIN I (HIGH SENSITIVITY)  TROPONIN I (HIGH SENSITIVITY)    EKG EKG Interpretation Date/Time:  Monday April 11 2023 17:22:27 EDT Ventricular Rate:  108 PR Interval:  150 QRS Duration:  74 QT Interval:  318 QTC Calculation: 426 R Axis:   23  Text Interpretation: Sinus tachycardia Otherwise normal ECG No significant change since last tracing Confirmed by Melene Plan [phone]) on 04/11/2023 9:06:42 PM  Radiology DG Chest 2 View Result Date: 04/11/2023 CLINICAL DATA:  Chest pain. Diagnosed with pneumonia 1 week ago. Cough and chest pain. EXAM: CHEST - 2 VIEW COMPARISON:  Chest radiographs 04/05/2023, 12/20/2022, 10/18/2022; CT chest 01/14/2023, PET-CT 10/11/2022 FINDINGS: Redemonstration of dense airspace opacification within the superior segment of the left lower lobe. This may be mildly improved on lateral view but appears  similar to 04/05/2023 on frontal view. Note is made this has been present over many comparison radiographs, with differential diagnosis concerning cryptogenic organizing pneumonia. Moderate diffuse bilateral interstitial thickening. No pleural effusion is seen. No pneumothorax. Moderate multilevel degenerative disc changes of the thoracic spine. Cardiac silhouette and mediastinal contours are within normal limits. IMPRESSION: 1. Redemonstration of dense airspace opacification within the superior segment of the left lower lobe. This may be mildly improved on lateral view but appears similar to 04/05/2023 on frontal view. Note is made this has been present over numerous comparison radiographs, with differential diagnosis concerning for cryptogenic organizing pneumonia. 2. Moderate diffuse chronic bilateral interstitial thickening. Electronically Signed   By: Neita Garnet M.D.   On: 04/11/2023 20:08    Procedures Procedures    Medications Ordered in ED Medications  albuterol (VENTOLIN HFA) 108 (90 Base) MCG/ACT inhaler 2 puff (has no administration in time range)    ED Course/ Medical Decision Making/ A&P                                 Medical Decision Making Amount and/or Complexity of Data Reviewed Labs: ordered. Radiology: ordered.  Risk Prescription drug management.   78 yo F with a chief complaints of cough difficulty breathing.  Going on for a few days.  She had been seen by her doctor was diagnosed with pneumonia and was started on antibiotics and steroids.  Since then she actually feels like she doing a bit better though had checked her oxygen level at home and it was low and so sent here for evaluation.  She has had no hypoxia here.  She is not having any tachypnea.  Chest x-ray unchanged from a few days ago and my independent interpretation.  Her renal function  is perhaps mildly worse from baseline.  Leukocytosis.  Troponin negative.  I discussed the results with the patient.   Will have her follow-up with her family doctor in the office.  Of note I am not sure that the patient's pulse oximeter at home works.  She told me that her spouses pulse ox was also low.  9:10 PM:  I have discussed the diagnosis/risks/treatment options with the patient and family.  Evaluation and diagnostic testing in the emergency department does not suggest an emergent condition requiring admission or immediate intervention beyond what has been performed at this time.  They will follow up with PCP. We also discussed returning to the ED immediately if new or worsening sx occur. We discussed the sx which are most concerning (e.g., sudden worsening pain, fever, inability to tolerate by mouth) that necessitate immediate return. Medications administered to the patient during their visit and any new prescriptions provided to the patient are listed below.  Medications given during this visit Medications  albuterol (VENTOLIN HFA) 108 (90 Base) MCG/ACT inhaler 2 puff (has no administration in time range)     The patient appears reasonably screen and/or stabilized for discharge and I doubt any other medical condition or other [facility] requiring further screening, evaluation, or treatment in the ED at this time prior to discharge.          Final Clinical Impression(s) / ED Diagnoses Final diagnoses:  Pneumonia of left lower lobe due to infectious organism    Rx / DC Orders ED Discharge Orders     None         Melene Plan, DO 04/11/23 2110

## 2023-04-11 NOTE — ED Triage Notes (Signed)
 Pt states that on Tuesday she was diagnosed with pneumonia and places on prednisone and and antibiotic that she can't recall the name of. She is done with the abx but not prednisone and doesn't feel any better. Pt with a productive cough. Denies fever that she knows of. Pt endorses chest pain and severe shortness of breath.

## 2023-04-11 NOTE — Telephone Encounter (Signed)
 Chief Complaint: hypoxia Symptoms: SOB, cough Frequency: 2 weeks Pertinent Negatives: Patient denies fever, CP Disposition: [x] ED /[] Urgent Care (no appt availability in office) / [] Appointment(In office/virtual)/ []  [facility]/ [] Home Care/ [] Refused Recommended Disposition /[] [facility]/ []  Follow-up with PCP Additional Notes: Pt c/o ongoing SOB, cough x 2 weeks. Recent acute visit and was dx with PNA and started on abx/steroids. Reports finishing abx today and has 2 doses of steroids left. Pt requesting repeat CXR to confirm PNA improving. Upon further triaging, SpO2 noted to be low. Pt is on RA at baseline, but has O2 tank from previous hospital admission. Pt reports SpO2 level did not improve with O2 administration. Triager strongly endorsed ED for further evaluation/treatment. Patient verbalized understanding and to call 911 with worsening symptoms.    Reason for Disposition  Oxygen level (e.g., pulse oximetry) 90 percent or lower  Answer Assessment - Initial Assessment Questions E2C2 Pulmonary Triage - Initial Assessment Questions "Chief Complaint (e.g., cough, sob, wheezing, fever, chills, sweat or additional symptoms) *Go to specific symptom protocol after initial questions. PNA sx - has been on medication x 1 week - took last abx today, has 3 more prednisone Unsure if abx has worked - requesting CXR   "How long have symptoms been present?" 2 weeks  Have you tested for COVID or Flu? Note: If not, ask patient if a home test can be taken. If so, instruct patient to call back for positive results. N/a  MEDICINES:    "Have you used your inhalers/maintenance medication?" Yes If yes, "What medications?" Stioloto - 2 puffs 1 daily Albuterol neb twice daily - since she's been sick  If inhaler, ask "How many puffs and how often?" Note: Review instructions on medication in the chart. See above  OXYGEN: "Do you wear supplemental oxygen?" Yes If yes, "How  many liters are you supposed to use?" "I have no idea" - 2 L Was on O2 in September for hospitalization - is typically on RA  "Do you monitor your oxygen levels?" Yes If yes, "What is your reading (oxygen level) today?" "Jumping all over the place" 80% on L hand R hand 76-78% Denies nail polish, cold hands Last used 2-3 days ago @ 93% Readings above on RA Per pt, still has O2 tank from last hospital admission, reports putting on with no change in SpO2 levels.   "What is your usual oxygen saturation reading?"  (Note: Pulmonary O2 sats should be 90% or greater) 92% per chart last office visit   1. RESPIRATORY STATUS: "Describe your breathing?" (e.g., wheezing, shortness of breath, unable to speak, severe coughing)      Lots of coughing - "I cough all the time anyways" 2. ONSET: "When did this breathing problem begin?"      X 2 weeks ago 3. PATTERN "Does the difficult breathing come and go, or has it been constant since it started?"      constant 4. SEVERITY: "How bad is your breathing?" (e.g., mild, moderate, severe)    - MILD: No SOB at rest, mild SOB with walking, speaks normally in sentences, can lie down, no retractions, pulse < 100.    - MODERATE: SOB at rest, SOB with minimal exertion and prefers to sit, cannot lie down flat, speaks in phrases, mild retractions, audible wheezing, pulse 100-120.    - SEVERE: Very SOB at rest, speaks in single words, struggling to breathe, sitting hunched forward, retractions, pulse > 120      Mild-mod with exertion -  is on O2, typically on RA 5. RECURRENT SYMPTOM: "Have you had difficulty breathing before?" If Yes, ask: "When was the last time?" and "What happened that time?"      Abx steroids 6. CARDIAC HISTORY: "Do you have any history of heart disease?" (e.g., heart attack, angina, bypass surgery, angioplasty)      HTN No CHF 7. LUNG HISTORY: "Do you have any history of lung disease?"  (e.g., pulmonary embolus, asthma, emphysema)     COPD 8.  CAUSE: "What do you think is causing the breathing problem?"      COPD exacerbation 9. OTHER SYMPTOMS: "Do you have any other symptoms? (e.g., dizziness, runny nose, cough, chest pain, fever)     Intermittent chest tightness - reports uses INH with relief  Protocols used: Breathing Difficulty-A-AH

## 2023-04-11 NOTE — Telephone Encounter (Signed)
 I agree- if she has persistent hypoxia in setting of this illness, needs to be evaluated by [place] or in ED/UC today, have repeat CXR, be considered for O2 at least temporarily

## 2023-04-11 NOTE — Telephone Encounter (Signed)
 Pt is aware of below message and voiced her understanding.  She will present to ED.  Nothing further needed.

## 2023-04-11 NOTE — ED Provider Triage Note (Signed)
 Emergency Medicine Provider Triage Evaluation Note  Tamara [name] , a 78 y.o. female  was evaluated in triage.  Pt complains of cough, weakness and SOB since being diagnosed with pneumonia. Still feels weak. Went to PCP today wanting xray, and was told to come to ED 2/2 to O2 sats in the 70s.  Review of Systems  Positive: SOB Negative: Chest pain  Physical Exam  BP 128/70 (BP Location: Right Arm)   Pulse (!) 103   Temp 98.5 F (36.9 C)   Resp 19   Ht 5\' 6"  (1.676 m)   Wt 100.2 kg   SpO2 96%   BMI 35.67 kg/m  Gen:   Awake, no distress   Resp:  Normal effort  MSK:   Moves extremities without difficulty  Other:  +coarse breath sounds   Medical Decision Making  Medically screening exam initiated at 5:19 PM.  Appropriate orders placed.  Emi Holes was informed that the remainder of the evaluation will be completed by another provider, this initial triage assessment does not replace that evaluation, and the importance of remaining in the ED until their evaluation is complete.     Pete Pelt, [place] 04/11/23 1721

## 2023-04-11 NOTE — Telephone Encounter (Signed)
Dr. Lamonte Sakai, Please advise.  Thank you.

## 2023-04-11 NOTE — Discharge Instructions (Signed)
 Please return for worsening difficulty breathing, or confusion or inability eat or drink.  Please follow-up with their doctor in the office.

## 2023-04-11 NOTE — ED Notes (Signed)
 Called and placed PT on monitor with C[facility].

## 2023-04-18 ENCOUNTER — Other Ambulatory Visit: Payer: Self-pay | Admitting: Internal Medicine

## 2023-04-28 ENCOUNTER — Ambulatory Visit: Payer: Medicare HMO | Admitting: Adult Health

## 2023-04-28 ENCOUNTER — Encounter: Payer: Self-pay | Admitting: Adult Health

## 2023-04-28 ENCOUNTER — Ambulatory Visit (INDEPENDENT_AMBULATORY_CARE_PROVIDER_SITE_OTHER)

## 2023-04-28 ENCOUNTER — Other Ambulatory Visit: Payer: Self-pay | Admitting: Internal Medicine

## 2023-04-28 VITALS — BP 126/66 | HR 108 | Temp 98.6°F | Ht 66.0 in | Wt 224.2 lb

## 2023-04-28 DIAGNOSIS — J449 Chronic obstructive pulmonary disease, unspecified: Secondary | ICD-10-CM

## 2023-04-28 DIAGNOSIS — K219 Gastro-esophageal reflux disease without esophagitis: Secondary | ICD-10-CM | POA: Diagnosis not present

## 2023-04-28 DIAGNOSIS — J9611 Chronic respiratory failure with hypoxia: Secondary | ICD-10-CM | POA: Diagnosis not present

## 2023-04-28 DIAGNOSIS — E1165 Type 2 diabetes mellitus with hyperglycemia: Secondary | ICD-10-CM

## 2023-04-28 DIAGNOSIS — R918 Other nonspecific abnormal finding of lung field: Secondary | ICD-10-CM | POA: Insufficient documentation

## 2023-04-28 DIAGNOSIS — J984 Other disorders of lung: Secondary | ICD-10-CM

## 2023-04-28 DIAGNOSIS — J189 Pneumonia, unspecified organism: Secondary | ICD-10-CM

## 2023-04-28 MED ORDER — PREDNISONE 10 MG PO TABS: ORAL_TABLET | ORAL | 0 refills | Status: DC

## 2023-04-28 MED ORDER — LEVOFLOXACIN 750 MG PO TABS
750.0000 mg | ORAL_TABLET | Freq: Every day | ORAL | 0 refills | Status: AC
Start: 2023-04-28 — End: 2023-05-08

## 2023-04-28 MED ORDER — BENZONATATE 200 MG PO CAPS: 200.0000 mg | ORAL_CAPSULE | Freq: Three times a day (TID) | ORAL | 1 refills | Status: DC | PRN

## 2023-04-28 NOTE — Assessment & Plan Note (Addendum)
 Slow to resolve COPD exacerbation-chest x-ray today shows increased airspace opacity on the right and persistent left lower lobe lung mass.  Patient has been given a 7-day course of Levaquin 750 mg daily.  Sputum culture and sputum AFB have been ordered.  Will give a low-dose steroid burst over the next 2 weeks 20 mg daily for 1 week and then 10 mg daily for 1 week-patient has insulin-dependent diabetes.  Patient is to call if blood sugars are greater than 250.  Check spirometry with DLCO on return visit in 2 months  Plan  Patient Instructions  Check Sputum Culture and Sputum AFB today  Begin Levaquin 750mg  daily for 10 days , take with food.  Eat Yogurt daily  Prednisone 20mg  daily for 1 week and 10mg  daily 1 week and stop .  Begin Liquid Mucinex DM Twice daily  for cough/congestion As needed   Albuterol neb Twice daily  followed by Flutter valve.  Wear Oxygen 2l/m with activity to keep O2 sats >88-90%.  Tessalon Three times a day  As needed  cough .  Continue on Protonix daily.  Begin Pepcid 20mg  At bedtime   Refer to GI for severe GERD  CT chest in June 2025 as planned.  Follow up with Dr. Delton Coombes  2 months after CT chest with Spirometry with DLCO  Follow up with in 2 weeks with Jaykwon Morones NP and As needed   Please contact office for sooner follow up if symptoms do not improve or worsen or seek emergency care

## 2023-04-28 NOTE — Assessment & Plan Note (Signed)
 Increased right-sided opacities consistent with superimposed pneumonia patient with recurrent episodes of pneumonia -plus or minus inflammatory component. She has had multiple courses of antibiotics.  Will check sputum culture and sputum AFB.  Have advised if symptoms or not improving will need hospitalization for consideration of IV antibiotics and further evaluation such as bronchoscopy with BAL.  Plan  Patient Instructions  Check Sputum Culture and Sputum AFB today  Begin Levaquin 750mg  daily for 10 days , take with food.  Eat Yogurt daily  Prednisone 20mg  daily for 1 week and 10mg  daily 1 week and stop .  Begin Liquid Mucinex DM Twice daily  for cough/congestion As needed   Albuterol neb Twice daily  followed by Flutter valve.  Wear Oxygen 2l/m with activity to keep O2 sats >88-90%.  Tessalon Three times a day  As needed  cough .  Continue on Protonix daily.  Begin Pepcid 20mg  At bedtime   Refer to GI for severe GERD  CT chest in June 2025 as planned.  Follow up with Dr. Delton Coombes  2 months after CT chest with Spirometry with DLCO  Follow up with in 2 weeks with Jeanita Carneiro NP and As needed   Please contact office for sooner follow up if symptoms do not improve or worsen or seek emergency care

## 2023-04-28 NOTE — Assessment & Plan Note (Signed)
 Left lower lobe lung mass/consolidation present since 2022.  Initial bronchoscopy negative with malignant cells.  Recent PET scan with little to none hypermetabolic activity in September 2024.  Stable on CT chest in December 2024. Has upcoming CT chest in June for serial surveillance.

## 2023-04-28 NOTE — Patient Instructions (Addendum)
 Check Sputum Culture and Sputum AFB today  Begin Levaquin 750mg  daily for 10 days , take with food.  Eat Yogurt daily  Prednisone 20mg  daily for 1 week and 10mg  daily 1 week and stop .  Begin Liquid Mucinex DM Twice daily  for cough/congestion As needed   Albuterol neb Twice daily  followed by Flutter valve.  Wear Oxygen 2l/m with activity to keep O2 sats >88-90%.  Tessalon Three times a day  As needed  cough .  Continue on Protonix daily.  Begin Pepcid 20mg  At bedtime   Refer to GI for severe GERD  CT chest in June 2025 as planned.  Follow up with Tamara [name]  2 months after CT chest with Spirometry with DLCO  Follow up with in 2 weeks with Tamara Soltis NP and As needed   Please contact office for sooner follow up if symptoms do not improve or worsen or seek emergency care

## 2023-04-28 NOTE — Assessment & Plan Note (Signed)
 Chronic hypoxic respiratory failure.  Continue on oxygen 2 L to maintain O2 saturations greater than 88 to 90%.  Portable oxygen concentrator has been ordered as patient needs a portable system

## 2023-04-28 NOTE — Assessment & Plan Note (Signed)
 Severe reflux despite PPI.  Add Pepcid at bedtime.  Refer to GI. Aspiration precautions discussed.

## 2023-04-28 NOTE — Progress Notes (Signed)
 @Patient  ID: Tamara [name], female    DOB: [birth year], 78 y.o.   MRN: [id number]  Chief Complaint  Patient presents with   Follow-up    Referring provider: Margarita Mail, DO  HPI: 78 year old female former smoker followed for COPD,, left lower lobe lung mass (Dx 2022)  Medical history significant for insulin-dependent diabetes  TEST/EVENTS :  CT scan of the chest 08/02/2022 reviewed by me, shows overall similar appearance of masslike consolidation in the posterior left lower lobe 4.7 x 6 cm. There is a new masslike consolidation and groundglass in the superior segment of the left lower lobe approximately 4.5 x 5.4 cm   PET scan 10/11/2022 reviewed by me, shows persistent left lower lobe rounded lesion, more superior left lower lobe focal opacity/infiltrate with little to no hypermetabolic activity, consistent with resolving pneumonia as opposed to malignancy.   CT chest 01/14/2023 reviewed by me shows a significant interval decrease in the diffuse bilateral peribronchovascular opacities compared with prior, some patchy areas of bronchiectasis, no honeycomb change.  The left lower lobe rounded masslike opacity is again unchanged   04/28/2023 Follow up : COPD, O2 RF , Lung mass  Patient returns for a 1 month follow-up with ongoing symptoms of cough, congestion, thick mucus.  Patient was seen last month with increased cough and congestion.  She was given a 7-day course of Levaquin 500 mg.  She has a penicillin allergy.  Patient says that she has some improvement but continues to have ongoing cough with very thick mucus.  Feels that with low energy.  Gets short of breath with minimum activities.  She has oxygen at home that she can use with activity but has no portable system.  She needs a POC device.  As it is hard for her to walk any amount of distance as she has balance issues and needs something lightweight.   Walk test today in the office:  Patient Saturations on Room Air at Rest =  96%   Patient Saturations on Room Air while Ambulating = 88%   Patient Saturations on 2 Liters of pulsed oxygen while Ambulating = 93%   Please briefly explain why patient needs home oxygen:  requires 2L of pulsed oxygen with exertion to keep sats >88-90%.  She denies any hemoptysis, vomiting or increased leg swelling.  She does complain of significant reflux despite being on Protonix daily previous swallow evaluation showed no aspiration  As above patient has had a left lower lung mass consolidation.  She underwent bronchoscopy in 2022 with cytology negative for malignant cells.  PET scan showed a persistent left lower lobe rounded lesion with minimal hypermetabolic activity in September 2024.  Follow-up CT chest December 2024 showed decreased bilateral opacities with patchy areas of bronchiectasis and a left rounded masslike opacity that was unchanged  Allergies  Allergen Reactions   Penicillins Hives    Tolerated rocephin 09/2022 Reaction: 1965    Immunization History  Administered Date(s) Administered   Fluad Quad(high Dose 65+) 09/20/2018, 10/16/2019, 10/16/2020   Influenza, High Dose Seasonal PF 10/16/2019   Influenza,inj,Quad PF,6+ Mos 10/18/2016, 09/29/2017, 10/20/2021   Pneumococcal Conjugate-13 10/16/2019   Pneumococcal Polysaccharide-23 03/26/2013    Past Medical History:  Diagnosis Date   Arthritis    knee and shoulders   Colon polyps 09/05/2017   COPD (chronic obstructive pulmonary disease) (HCC)    Diabetes mellitus without complication (HCC)    type II   Dyspnea    05/16/20 has had a cough for 2.5 years  post Covid   Gastritis 09/05/2017   GERD (gastroesophageal reflux disease)    Hyperlipidemia    Hypertension    patient denies   Hypothyroidism    Panic attack    RUQ pain 03/09/2017   Per patient, she has had RUQ pain 4-5 years that has grown worse in the last few months.    Tobacco History: Social History   Tobacco Use  Smoking Status Former    Current packs/day: 0.00   Average packs/day: 1 pack/day for 40.0 years (40.0 ttl pk-yrs)   Types: Cigarettes   Start date: 26   Quit date: 2006   Years since quitting: 19.2  Smokeless Tobacco Never  Tobacco Comments   Smoking cessation materials not required   Counseling given: Not Answered Tobacco comments: Smoking cessation materials not required   Outpatient Medications Prior to Visit  Medication Sig Dispense Refill   albuterol (PROVENTIL) (2.5 MG/3ML) 0.083% nebulizer solution Take 3 mLs (2.5 mg total) by nebulization every 6 (six) hours as needed for wheezing or shortness of breath. 180 mL 5   albuterol (VENTOLIN HFA) 108 (90 Base) MCG/ACT inhaler Inhale 2 puffs into the lungs every 6 (six) hours as needed for wheezing or shortness of breath. 8 g 6   Tiotropium Bromide-Olodaterol (STIOLTO RESPIMAT) 2.5-2.5 MCG/ACT AERS Inhale 2 puffs into the lungs daily.     Accu-Chek Softclix Lancets lancets TEST BLOOD SUGAR TWO TIMES DAILY 200 each 3   Alcohol Swabs (DROPSAFE ALCOHOL PREP) 70 % PADS USE AS DIRECTED TWO TIMES DAILY WHEN TESTING BLOOD SUGAR 200 each 3   Ascorbic Acid (VITAMIN C) 1000 MG tablet Take 1,000 mg by mouth daily.     aspirin EC 81 MG tablet Take 1 tablet (81 mg total) by mouth daily. Swallow whole. 90 tablet 3   Blood Glucose Monitoring Suppl (TRUE METRIX METER) w/Device KIT TEST BLOOD SUGAR TWICE DAILY 1 kit 3   Cholecalciferol (VITAMIN D) 50 MCG (2000 UT) CAPS Take 1,000 Units by mouth daily.     DROPLET PEN NEEDLES 32G X 4 MM MISC USE AS DIRECTED 200 each 3   ezetimibe (ZETIA) 10 MG tablet Take 1 tablet (10 mg total) by mouth daily. 90 tablet 1   famotidine (PEPCID) 20 MG tablet Take 1 tablet (20 mg total) by mouth 2 (two) times daily. 60 tablet 0   Ferrous Fumarate (HEMOCYTE - 106 MG FE) 324 (106 Fe) MG TABS tablet Take 1 tablet by mouth daily.     Ferrous Fumarate-DSS (FE CAPS/STOOL SOFTENER) 50-100 MG TABS Take 50-100 mg by mouth daily. 30 tablet 3   furosemide  (LASIX) 40 MG tablet TAKE 1 TABLET EVERY DAY 90 tablet 1   hydrochlorothiazide (MICROZIDE) 12.5 MG capsule TAKE 1 CAPSULE EVERY DAY 90 capsule 0   Insulin NPH, Human,, Isophane, (NOVOLIN N FLEXPEN) 100 UNIT/ML Kiwkpen INJECT 36 UNITS UNDER THE SKIN IN THE MORNING AND 28 TO 30 UNITS AT BEDTIME 60 mL 3   levothyroxine (SYNTHROID) 50 MCG tablet TAKE 1 TABLET EVERY DAY BEFORE BREAKFAST 90 tablet 3   metFORMIN (GLUCOPHAGE) 1000 MG tablet Take 1 tablet (1,000 mg total) by mouth 2 (two) times daily. 180 tablet 3   metoCLOPramide (REGLAN) 10 MG tablet Take 1 tablet (10 mg total) by mouth every 6 (six) hours as needed. 30 tablet 0   nystatin (MYCOSTATIN/NYSTOP) powder Apply 1 Application topically daily as needed (yeast under belly). 60 g 2   ondansetron (ZOFRAN) 4 MG tablet Take 1 tablet (4 mg total)  by mouth every 6 (six) hours as needed for nausea. 20 tablet 0   pantoprazole (PROTONIX) 40 MG tablet TAKE 1 TABLET TWICE DAILY 180 tablet 3   potassium chloride (KLOR-CON M) 10 MEQ tablet Take 1 tablet (10 mEq total) by mouth daily. 90 tablet 2   rosuvastatin (CRESTOR) 20 MG tablet Take 1 tablet (20 mg total) by mouth at bedtime. 90 tablet 1   sucralfate (CARAFATE) 1 g tablet Take 1 tablet (1 g total) by mouth 4 (four) times daily. 120 tablet 1   Tiotropium Bromide-Olodaterol (STIOLTO RESPIMAT) 2.5-2.5 MCG/ACT AERS Inhale 2 puffs into the lungs daily. 4 g 11   TRUE METRIX BLOOD GLUCOSE TEST test strip Use 2x a day 200 each 3   valsartan (DIOVAN) 80 MG tablet TAKE 1 TABLET (80 MG TOTAL) BY MOUTH DAILY. 90 tablet 3   levofloxacin (LEVAQUIN) 500 MG tablet Take 1 tablet (500 mg total) by mouth daily. (Patient not taking: Reported on 04/28/2023) 7 tablet 0   No facility-administered medications prior to visit.     Review of Systems:   Constitutional:   No  weight loss, night sweats,  Fevers, chills, +fatigue, or  lassitude.  HEENT:   No headaches,  Difficulty swallowing,  Tooth/dental problems, or  Sore  throat,                No sneezing, itching, ear ache, +nasal congestion, post nasal drip,   CV:  No chest pain,  Orthopnea, PND, swelling in lower extremities, anasarca, dizziness, palpitations, syncope.   GI  No heartburn, indigestion, abdominal pain, nausea, vomiting, diarrhea, change in bowel habits, loss of appetite, bloody stools.   Resp:   No chest wall deformity  Skin: no rash or lesions.  GU: no dysuria, change in color of urine, no urgency or frequency.  No flank pain, no hematuria   MS:  No joint pain or swelling.  No decreased range of motion.  No back pain.    Physical Exam  BP 126/66 (BP Location: Right Arm, Patient Position: Sitting, Cuff Size: Large)   Pulse (!) 108   Temp 98.6 F (37 C) (Oral)   Ht 5\' 6"  (1.676 m)   Wt 224 lb 3.2 oz (101.7 kg)   SpO2 96%   BMI 36.19 kg/m   GEN: A/Ox3; pleasant , NAD, elderly, chronically ill-appearing, wheelchair   HEENT:  [place]/AT, NOSE-clear, THROAT-clear, no lesions, no postnasal drip or exudate noted.   NECK:  Supple w/ fair ROM; no JVD; normal carotid impulses w/o bruits; no thyromegaly or nodules palpated; no lymphadenopathy.    RESP coarse scattered rhonchi bilaterally, speaks in full sentences.  . no accessory muscle use, no dullness to percussion  CARD:  RRR, no m/r/g, no peripheral edema, pulses intact, no cyanosis or clubbing.  GI:   Soft & nt; nml bowel sounds; no organomegaly or masses detected.   Musco: Warm bil, no deformities or joint swelling noted.   Neuro: alert, no focal deficits noted.    Skin: Warm, no lesions or rashes    Lab Results:  CBC    Component Value Date/Time   WBC 11.9 (H) 04/11/2023 1715   RBC 4.03 04/11/2023 1715   HGB 10.2 (L) 04/11/2023 1715   HCT 31.7 (L) 04/11/2023 1715   PLT 216 04/11/2023 1715   MCV 78.7 (L) 04/11/2023 1715   MCH 25.3 (L) 04/11/2023 1715   MCHC 32.2 04/11/2023 1715   RDW 15.9 (H) 04/11/2023 1715   LYMPHSABS 1,128 10/27/2022 1106  MONOABS 0.2  09/23/2022 1159   EOSABS 281 10/27/2022 1106   BASOSABS 50 10/27/2022 1106    BMET    Component Value Date/Time   NA 135 04/11/2023 1715   NA 140 05/27/2022 1151   K 4.6 04/11/2023 1715   CL 101 04/11/2023 1715   CO2 20 (L) 04/11/2023 1715   GLUCOSE 167 (H) 04/11/2023 1715   BUN 26 (H) 04/11/2023 1715   BUN 17 05/27/2022 1151   CREATININE 1.28 (H) 04/11/2023 1715   CREATININE 1.02 (H) 10/27/2022 1106   CALCIUM 10.4 (H) 04/11/2023 1715   GFRNONAA 43 (L) 04/11/2023 1715   GFRNONAA 63 11/15/2019 1523   GFRAA 73 11/15/2019 1523    BNP    Component Value Date/Time   BNP 33.7 09/29/2022 0621   BNP 16 10/21/2021 1431    ProBNP No results found for: "PROBNP"  Imaging: DG Chest 2 View Result Date: 04/28/2023 CLINICAL DATA:  Pneumonia EXAM: CHEST - 2 VIEW COMPARISON:  04/11/2023 FINDINGS: Cardiomediastinal silhouette and pulmonary vasculature are within normal limits. Dense consolidative opacity again seen within the left lower lobe is not significantly changed in the prior examination. There has been mild interval increase of right lung airspace opacity which is suspicious for pneumonia superimposed on chronic interstitial pneumonitis. IMPRESSION: 1. Worsening right lung airspace opacity suspicious for acute pneumonia superimposed on chronic interstitial lung disease. 2. Unchanged dense consolidative airspace opacity of the left lower lobe. Electronically Signed   By: Acquanetta Belling M.D.   On: 04/28/2023 13:51   DG Chest 2 View Result Date: 04/11/2023 CLINICAL DATA:  Chest pain. Diagnosed with pneumonia 1 week ago. Cough and chest pain. EXAM: CHEST - 2 VIEW COMPARISON:  Chest radiographs 04/05/2023, 12/20/2022, 10/18/2022; CT chest 01/14/2023, PET-CT 10/11/2022 FINDINGS: Redemonstration of dense airspace opacification within the superior segment of the left lower lobe. This may be mildly improved on lateral view but appears similar to 04/05/2023 on frontal view. Note is made this has been  present over many comparison radiographs, with differential diagnosis concerning cryptogenic organizing pneumonia. Moderate diffuse bilateral interstitial thickening. No pleural effusion is seen. No pneumothorax. Moderate multilevel degenerative disc changes of the thoracic spine. Cardiac silhouette and mediastinal contours are within normal limits. IMPRESSION: 1. Redemonstration of dense airspace opacification within the superior segment of the left lower lobe. This may be mildly improved on lateral view but appears similar to 04/05/2023 on frontal view. Note is made this has been present over numerous comparison radiographs, with differential diagnosis concerning for cryptogenic organizing pneumonia. 2. Moderate diffuse chronic bilateral interstitial thickening. Electronically Signed   By: Neita Garnet M.D.   On: 04/11/2023 20:08   DG Chest 2 View Result Date: 04/11/2023 CLINICAL DATA:  Abnormal breath sounds.  Concern for pneumonia. EXAM: CHEST - 2 VIEW COMPARISON:  Chest radiographs 12/20/2022, 10/18/2022,, 06/08/2021; CT chest 01/14/2023 FINDINGS: Cardiac silhouette and mediastinal contours are within normal limits. There is again moderate diffuse bilateral interstitial thickening. There is again dense opacification within the posterior superior left lower lobe. Mildly improved aeration of the posterior left lower lobe airspace opacification on 12/20/2022 and 10/18/2022 radiographs and 01/14/2023 CT. No pleural effusion pneumothorax. Moderate multilevel degenerative disc changes of the thoracic spine. IMPRESSION: 1. Moderate diffuse bilateral interstitial thickening, chronic. 2. Dense opacification within the superior segment of the left lower lobe. Mildly improved aeration of the posterior left lower lobe airspace opacification on 12/20/2022 and 10/18/2022 radiographs and 01/14/2023 CT. Differential considerations again include cryptogenic organizing pneumonia. Note is made on  prior PET-CT 12 10/11/2022  that superior segment of the left lower lobe malignancy could not be excluded. Electronically Signed   By: Neita Garnet M.D.   On: 04/11/2023 20:06    Administration History     None          Latest Ref Rng & Units 08/18/2021    9:55 AM  PFT Results  FVC-Pre L 2.27   FVC-Predicted Pre % 74   FVC-Post L 2.28   FVC-Predicted Post % 75   Pre FEV1/FVC % % 84   Post FEV1/FCV % % 85   FEV1-Pre L 1.91   FEV1-Predicted Pre % 83   FEV1-Post L 1.94   DLCO uncorrected ml/min/mmHg 14.64   DLCO UNC% % 72   DLCO corrected ml/min/mmHg 16.16   DLCO COR %Predicted % 80   DLVA Predicted % 102   TLC L 4.19   TLC % Predicted % 79   RV % Predicted % 78     No results found for: "NITRICOXIDE"      Assessment & Plan:   COPD (chronic obstructive pulmonary disease) (HCC) Slow to resolve COPD exacerbation-chest x-ray today shows increased airspace opacity on the right and persistent left lower lobe lung mass.  Patient has been given a 7-day course of Levaquin 750 mg daily.  Sputum culture and sputum AFB have been ordered.  Will give a low-dose steroid burst over the next 2 weeks 20 mg daily for 1 week and then 10 mg daily for 1 week-patient has insulin-dependent diabetes.  Patient is to call if blood sugars are greater than 250.  Check spirometry with DLCO on return visit in 2 months  Plan  Patient Instructions  Check Sputum Culture and Sputum AFB today  Begin Levaquin 750mg  daily for 10 days , take with food.  Eat Yogurt daily  Prednisone 20mg  daily for 1 week and 10mg  daily 1 week and stop .  Begin Liquid Mucinex DM Twice daily  for cough/congestion As needed   Albuterol neb Twice daily  followed by Flutter valve.  Wear Oxygen 2l/m with activity to keep O2 sats >88-90%.  Tessalon Three times a day  As needed  cough .  Continue on Protonix daily.  Begin Pepcid 20mg  At bedtime   Refer to GI for severe GERD  CT chest in June 2025 as planned.  Follow up with Dr. Delton Coombes  2 months after CT  chest with Spirometry with DLCO  Follow up with in 2 weeks with Lataisha Colan NP and As needed   Please contact office for sooner follow up if symptoms do not improve or worsen or seek emergency care       Chronic hypoxic respiratory failure (HCC) Chronic hypoxic respiratory failure.  Continue on oxygen 2 L to maintain O2 saturations greater than 88 to 90%.  Portable oxygen concentrator has been ordered as patient needs a portable system  Multifocal pneumonia Increased right-sided opacities consistent with superimposed pneumonia patient with recurrent episodes of pneumonia -plus or minus inflammatory component. She has had multiple courses of antibiotics.  Will check sputum culture and sputum AFB.  Have advised if symptoms or not improving will need hospitalization for consideration of IV antibiotics and further evaluation such as bronchoscopy with BAL.  Plan  Patient Instructions  Check Sputum Culture and Sputum AFB today  Begin Levaquin 750mg  daily for 10 days , take with food.  Eat Yogurt daily  Prednisone 20mg  daily for 1 week and 10mg  daily 1 week and stop .  Begin Liquid Mucinex DM Twice daily  for cough/congestion As needed   Albuterol neb Twice daily  followed by Flutter valve.  Wear Oxygen 2l/m with activity to keep O2 sats >88-90%.  Tessalon Three times a day  As needed  cough .  Continue on Protonix daily.  Begin Pepcid 20mg  At bedtime   Refer to GI for severe GERD  CT chest in June 2025 as planned.  Follow up with Dr. Delton Coombes  2 months after CT chest with Spirometry with DLCO  Follow up with in 2 weeks with Unique Sillas NP and As needed   Please contact office for sooner follow up if symptoms do not improve or worsen or seek emergency care       GERD (gastroesophageal reflux disease) Severe reflux despite PPI.  Add Pepcid at bedtime.  Refer to GI. Aspiration precautions discussed.  Lung mass Left lower lobe lung mass/consolidation present since 2022.  Initial bronchoscopy  negative with malignant cells.  Recent PET scan with little to none hypermetabolic activity in September 2024.  Stable on CT chest in December 2024. Has upcoming CT chest in June for serial surveillance.    I spent 47  minutes dedicated to the care of this patient on the date of this encounter to include pre-visit review of records, face-to-face time with the patient discussing conditions above, post visit ordering of testing, clinical documentation with the electronic health record, making appropriate referrals as documented, and communicating necessary findings to members of the patients care team.   [name], NP 04/28/2023

## 2023-05-13 ENCOUNTER — Ambulatory Visit (INDEPENDENT_AMBULATORY_CARE_PROVIDER_SITE_OTHER)

## 2023-05-13 ENCOUNTER — Telehealth: Payer: Self-pay | Admitting: Emergency Medicine

## 2023-05-13 ENCOUNTER — Ambulatory Visit: Admitting: Adult Health

## 2023-05-13 ENCOUNTER — Encounter: Payer: Self-pay | Admitting: Adult Health

## 2023-05-13 VITALS — BP 116/62 | HR 98 | Ht 66.0 in | Wt 226.4 lb

## 2023-05-13 DIAGNOSIS — J9611 Chronic respiratory failure with hypoxia: Secondary | ICD-10-CM | POA: Diagnosis not present

## 2023-05-13 DIAGNOSIS — R918 Other nonspecific abnormal finding of lung field: Secondary | ICD-10-CM

## 2023-05-13 DIAGNOSIS — J189 Pneumonia, unspecified organism: Secondary | ICD-10-CM

## 2023-05-13 DIAGNOSIS — J984 Other disorders of lung: Secondary | ICD-10-CM

## 2023-05-13 DIAGNOSIS — J449 Chronic obstructive pulmonary disease, unspecified: Secondary | ICD-10-CM | POA: Diagnosis not present

## 2023-05-13 DIAGNOSIS — J188 Other pneumonia, unspecified organism: Secondary | ICD-10-CM

## 2023-05-13 NOTE — Patient Instructions (Addendum)
 Chest xray today  Continue on Stiolto 2 puffs daily  Liquid Mucinex  DM Twice daily  for cough/congestion As needed   Albuterol  neb Twice daily  followed by Flutter valve.  Wear Oxygen 2l/m with activity to keep O2 sats >88-90%.  Tessalon  Three times a day  As needed  cough .  Continue on Protonix  daily.  Pepcid  20mg  At bedtime   Check on GI referral  for severe GERD  CT chest in June 2025 as planned.  Follow up with Dr. Baldwin Levee  6 weeks with PFT  and As needed   Please contact office for sooner follow up if symptoms do not improve or worsen or seek emergency care

## 2023-05-13 NOTE — Assessment & Plan Note (Signed)
 Recent exacerbation with superimposed pneumonia.  Continue on current regimen.  Clinically she has improved.  Check PFTs on return visit  Plan  Patient Instructions  Chest xray today  Continue on Stiolto 2 puffs daily  Liquid Mucinex  DM Twice daily  for cough/congestion As needed   Albuterol  neb Twice daily  followed by Flutter valve.  Wear Oxygen 2l/m with activity to keep O2 sats >88-90%.  Tessalon  Three times a day  As needed  cough .  Continue on Protonix  daily.  Pepcid  20mg  At bedtime   Check on GI referral  for severe GERD  CT chest in June 2025 as planned.  Follow up with Dr. Baldwin Levee  6 weeks with PFT  and As needed   Please contact office for sooner follow up if symptoms do not improve or worsen or seek emergency care

## 2023-05-13 NOTE — Assessment & Plan Note (Signed)
 Recent right-sided pneumonia with opacities consistent with a superimposed pneumonia.  Patient has recurrent episodes of pneumonia plus or minus inflammatory component.  She has completed multiple courses of antibiotics.  Clinically she is improved today.  Will repeat chest x-ray for any worsening opacities.  She has an upcoming CT chest in 4 to 5 weeks.  If no significant improvement or resolution will need to consider bronchoscopy with BAL and cytology.  She was unable to give a sputum culture and sputum AFB.  Check PFTs return visit.  Plan  Patient Instructions  Chest xray today  Continue on Stiolto 2 puffs daily  Liquid Mucinex  DM Twice daily  for cough/congestion As needed   Albuterol  neb Twice daily  followed by Flutter valve.  Wear Oxygen 2l/m with activity to keep O2 sats >88-90%.  Tessalon  Three times a day  As needed  cough .  Continue on Protonix  daily.  Pepcid  20mg  At bedtime   Check on GI referral  for severe GERD  CT chest in June 2025 as planned.  Follow up with Dr. Baldwin Levee  6 weeks with PFT  and As needed   Please contact office for sooner follow up if symptoms do not improve or worsen or seek emergency care

## 2023-05-13 NOTE — Telephone Encounter (Signed)
 Ms. French Jester wanted PT to see Dr. Baldwin Levee after PFT in 6 weeks. I have no appts for Dr. Baldwin Levee close to that 6 weeks. Please advise and let us  call PT to sched based on recommendation.

## 2023-05-13 NOTE — Assessment & Plan Note (Signed)
 Left lower lobe lung mass/consolidation present since 2022.  Initial bronchoscopy with negative malignant cells.  PET scan with little to none metabolic activity September 2024.  Stable CT chest December 2024.  Continue with upcoming surveillance CT early June 2025.

## 2023-05-13 NOTE — Progress Notes (Signed)
 @Patient  ID: Tamara [name], female    DOB: October 30, 1945, 78 y.o.   MRN: [id number]  Chief Complaint  Patient presents with   Follow-up    Referring provider: Rockney Cid, DO  HPI: 78 year old female former smoker followed for COPD, left lower lobe lung mass (diagnosed in 2022) Medical history significant for insulin -dependent diabetes  TEST/EVENTS :  CT scan of the chest 08/02/2022 reviewed by me, shows overall similar appearance of masslike consolidation in the posterior left lower lobe 4.7 x 6 cm. There is a new masslike consolidation and groundglass in the superior segment of the left lower lobe approximately 4.5 x 5.4 cm    PET scan 10/11/2022 reviewed by me, shows persistent left lower lobe rounded lesion, more superior left lower lobe focal opacity/infiltrate with little to no hypermetabolic activity, consistent with resolving pneumonia as opposed to malignancy.    CT chest 01/14/2023 reviewed by me shows a significant interval decrease in the diffuse bilateral peribronchovascular opacities compared with prior, some patchy areas of bronchiectasis, no honeycomb change.  The left lower lobe rounded masslike opacity is again unchanged    05/13/2023 Follow up ; COPD, O2 RF and Lung mass  Patient returns for a 2-week follow-up.  As above patient has had a left lower lobe lung mass consolidation noted on imaging since 2022.  She underwent bronchoscopy in 2022 with cytology negative for malignant cells.  PET scan showed a persistent left lower lobe rounded lesion with minimal hypermetabolic activity in September 2024.  Follow-up CT chest December 2024 showed a decreased bilateral opacities with patchy areas of bronchiectasis and a left rounded masslike opacity that was unchanged.  Last visit patient had acute symptoms with cough and congestion.  Chest x-ray showed right lung airspace opacity and unchanged left lower lobe consolidation.  She was treated with a 7-day course of Levaquin   750 mg and a steroid taper.  She was recommended on sputum culture and sputum AFB.  Patient says she is feeling much better.  Cough and congestion have decreased.  She was unable to do sputum cultures. She remains on Stiolto daily.  Continues on oxygen 2 L with activity. Patient was have ongoing reflux symptoms.  Was referred to GI for reflux.  Patient has an upcoming CT chest in early June.  She denies any hemoptysis, chest pain, orthopnea.   Allergies  Allergen Reactions   Penicillins Hives    Tolerated rocephin  09/2022 Reaction: 1965    Immunization History  Administered Date(s) Administered   Fluad Quad(high Dose 65+) 09/20/2018, 10/16/2019, 10/16/2020   Influenza, High Dose Seasonal PF 10/16/2019   Influenza,inj,Quad PF,6+ Mos 10/18/2016, 09/29/2017, 10/20/2021   Pneumococcal Conjugate-13 10/16/2019   Pneumococcal Polysaccharide-23 03/26/2013    Past Medical History:  Diagnosis Date   Arthritis    knee and shoulders   Colon polyps 09/05/2017   COPD (chronic obstructive pulmonary disease) (HCC)    Diabetes mellitus without complication (HCC)    type II   Dyspnea    05/16/20 has had a cough for 2.5 years post Covid   Gastritis 09/05/2017   GERD (gastroesophageal reflux disease)    Hyperlipidemia    Hypertension    patient denies   Hypothyroidism    Panic attack    RUQ pain 03/09/2017   Per patient, she has had RUQ pain 4-5 years that has grown worse in the last few months.    Tobacco History: Social History   Tobacco Use  Smoking Status Former   Current packs/day: 0.00  Average packs/day: 1 pack/day for 40.0 years (40.0 ttl pk-yrs)   Types: Cigarettes   Start date: 4   Quit date: 2006   Years since quitting: 19.3  Smokeless Tobacco Never  Tobacco Comments   Smoking cessation materials not required   Counseling given: Not Answered Tobacco comments: Smoking cessation materials not required   Outpatient Medications Prior to Visit  Medication Sig  Dispense Refill   Accu-Chek Softclix Lancets lancets TEST BLOOD SUGAR TWO TIMES DAILY 200 each 3   albuterol  (PROVENTIL ) (2.5 MG/3ML) 0.083% nebulizer solution Take 3 mLs (2.5 mg total) by nebulization every 6 (six) hours as needed for wheezing or shortness of breath. 180 mL 5   albuterol  (VENTOLIN  HFA) 108 (90 Base) MCG/ACT inhaler Inhale 2 puffs into the lungs every 6 (six) hours as needed for wheezing or shortness of breath. 8 g 6   Alcohol Swabs (DROPSAFE ALCOHOL PREP) 70 % PADS USE AS DIRECTED TWO TIMES DAILY WHEN TESTING BLOOD SUGAR 200 each 3   Ascorbic Acid  (VITAMIN C ) 1000 MG tablet Take 1,000 mg by mouth daily.     aspirin  EC 81 MG tablet Take 1 tablet (81 mg total) by mouth daily. Swallow whole. 90 tablet 3   benzonatate  (TESSALON ) 200 MG capsule Take 1 capsule (200 mg total) by mouth 3 (three) times daily as needed. 45 capsule 1   Blood Glucose Monitoring Suppl (TRUE METRIX METER) w/Device KIT TEST BLOOD SUGAR TWICE DAILY 1 kit 3   Cholecalciferol  (VITAMIN D ) 50 MCG (2000 UT) CAPS Take 1,000 Units by mouth daily.     DROPLET PEN NEEDLES 32G X 4 MM MISC USE AS DIRECTED 200 each 3   ezetimibe  (ZETIA ) 10 MG tablet Take 1 tablet (10 mg total) by mouth daily. 90 tablet 1   famotidine  (PEPCID ) 20 MG tablet Take 1 tablet (20 mg total) by mouth 2 (two) times daily. 60 tablet 0   Ferrous Fumarate (HEMOCYTE - 106 MG FE) 324 (106 Fe) MG TABS tablet Take 1 tablet by mouth daily.     Ferrous Fumarate-DSS (FE CAPS/STOOL SOFTENER) 50-100 MG TABS Take 50-100 mg by mouth daily. 30 tablet 3   furosemide  (LASIX ) 40 MG tablet TAKE 1 TABLET EVERY DAY 90 tablet 1   hydrochlorothiazide  (MICROZIDE ) 12.5 MG capsule TAKE 1 CAPSULE EVERY DAY 90 capsule 0   levothyroxine  (SYNTHROID ) 50 MCG tablet TAKE 1 TABLET EVERY DAY BEFORE BREAKFAST 90 tablet 3   metFORMIN  (GLUCOPHAGE ) 1000 MG tablet Take 1 tablet (1,000 mg total) by mouth 2 (two) times daily. 180 tablet 3   metoCLOPramide  (REGLAN ) 10 MG tablet Take 1 tablet  (10 mg total) by mouth every 6 (six) hours as needed. 30 tablet 0   NOVOLIN  N FLEXPEN 100 UNIT/ML FlexPen INJECT 36 UNITS UNDER THE SKIN IN THE MORNING AND 28 TO 30 UNITS AT BEDTIME 60 mL 3   nystatin  (MYCOSTATIN /NYSTOP ) powder Apply 1 Application topically daily as needed (yeast under belly). 60 g 2   ondansetron  (ZOFRAN ) 4 MG tablet Take 1 tablet (4 mg total) by mouth every 6 (six) hours as needed for nausea. 20 tablet 0   pantoprazole  (PROTONIX ) 40 MG tablet TAKE 1 TABLET TWICE DAILY 180 tablet 3   potassium chloride  (KLOR-CON  M) 10 MEQ tablet Take 1 tablet (10 mEq total) by mouth daily. 90 tablet 2   predniSONE  (DELTASONE ) 10 MG tablet 2 tabs daily for 7 days, then 1 tab for 7 days, then stop 21 tablet 0   rosuvastatin  (CRESTOR ) 20 MG  tablet Take 1 tablet (20 mg total) by mouth at bedtime. 90 tablet 1   sucralfate  (CARAFATE ) 1 g tablet Take 1 tablet (1 g total) by mouth 4 (four) times daily. 120 tablet 1   Tiotropium Bromide -Olodaterol (STIOLTO RESPIMAT ) 2.5-2.5 MCG/ACT AERS Inhale 2 puffs into the lungs daily. 4 g 11   Tiotropium Bromide -Olodaterol (STIOLTO RESPIMAT ) 2.5-2.5 MCG/ACT AERS Inhale 2 puffs into the lungs daily.     TRUE METRIX BLOOD GLUCOSE TEST test strip Use 2x a day 200 each 3   valsartan  (DIOVAN ) 80 MG tablet TAKE 1 TABLET (80 MG TOTAL) BY MOUTH DAILY. 90 tablet 3   No facility-administered medications prior to visit.     Review of Systems:   Constitutional:   No  weight loss, night sweats,  Fevers, chills,+fatigue, or  lassitude.  HEENT:   No headaches,  Difficulty swallowing,  Tooth/dental problems, or  Sore throat,                No sneezing, itching, ear ache, nasal congestion, post nasal drip,   CV:  No chest pain,  Orthopnea, PND, swelling in lower extremities, anasarca, dizziness, palpitations, syncope.   GI  No heartburn, indigestion, abdominal pain, nausea, vomiting, diarrhea, change in bowel habits, loss of appetite, bloody stools.   Resp:  No chest wall  deformity  Skin: no rash or lesions.  GU: no dysuria, change in color of urine, no urgency or frequency.  No flank pain, no hematuria   MS:  No joint pain or swelling.  No decreased range of motion.  No back pain.    Physical Exam  BP 116/62 (BP Location: Left Arm, Patient Position: Sitting)   Pulse 98   Ht 5\' 6"  (1.676 m)   Wt 226 lb 6.4 oz (102.7 kg)   SpO2 96%   BMI 36.54 kg/m   GEN: A/Ox3; pleasant , NAD, well nourished    HEENT:  [place]/AT,   NOSE-clear, THROAT-clear, no lesions, no postnasal drip or exudate noted.   NECK:  Supple w/ fair ROM; no JVD; normal carotid impulses w/o bruits; no thyromegaly or nodules palpated; no lymphadenopathy.    RESP coarse rhonchi bilaterally  no accessory muscle use, no dullness to percussion  CARD:  RRR, no m/r/g, no peripheral edema, pulses intact, no cyanosis or clubbing.  GI:   Soft & nt; nml bowel sounds; no organomegaly or masses detected.   Musco: Warm bil, no deformities or joint swelling noted.   Neuro: alert, no focal deficits noted.    Skin: Warm, no lesions or rashes      ProBNP No results found for: "PROBNP"  Imaging: DG Chest 2 View Result Date: 04/28/2023 CLINICAL DATA:  Pneumonia EXAM: CHEST - 2 VIEW COMPARISON:  04/11/2023 FINDINGS: Cardiomediastinal silhouette and pulmonary vasculature are within normal limits. Dense consolidative opacity again seen within the left lower lobe is not significantly changed in the prior examination. There has been mild interval increase of right lung airspace opacity which is suspicious for pneumonia superimposed on chronic interstitial pneumonitis. IMPRESSION: 1. Worsening right lung airspace opacity suspicious for acute pneumonia superimposed on chronic interstitial lung disease. 2. Unchanged dense consolidative airspace opacity of the left lower lobe. Electronically Signed   By: Elester Grim M.D.   On: 04/28/2023 13:51    Administration History     None          Latest Ref  Rng & Units 08/18/2021    9:55 AM  PFT Results  FVC-Pre L  2.27   FVC-Predicted Pre % 74   FVC-Post L 2.28   FVC-Predicted Post % 75   Pre FEV1/FVC % % 84   Post FEV1/FCV % % 85   FEV1-Pre L 1.91   FEV1-Predicted Pre % 83   FEV1-Post L 1.94   DLCO uncorrected ml/min/mmHg 14.64   DLCO UNC% % 72   DLCO corrected ml/min/mmHg 16.16   DLCO COR %Predicted % 80   DLVA Predicted % 102   TLC L 4.19   TLC % Predicted % 79   RV % Predicted % 78     No results found for: "NITRICOXIDE"      Assessment & Plan:   No problem-specific Assessment & Plan notes found for this encounter.     Roena Clark, NP 05/13/2023

## 2023-05-13 NOTE — Assessment & Plan Note (Signed)
 Continue on O2 to maintain O2 saturations greater than 88 to 9%

## 2023-05-14 ENCOUNTER — Other Ambulatory Visit: Payer: Self-pay | Admitting: Internal Medicine

## 2023-05-17 NOTE — Telephone Encounter (Signed)
 Tamara [name], This was routed to me on 4/25 while I was out of the office.  Please advise on her follow up.  Thank you.

## 2023-05-18 ENCOUNTER — Telehealth: Payer: Self-pay | Admitting: Internal Medicine

## 2023-05-18 NOTE — Telephone Encounter (Signed)
 Good morning Dr. Rosaline Coma,   Doc of Day AM 4/30  We received a referral for patient to be seen for GERD. Patient recently saw [facility] on 4/17 for a colonoscopy. Patient states she is not wanting to continue her care with [facility] due to "not trusting" their care. Patient's previous records are in Epic for you to review and advise on scheduling.   Thank you.

## 2023-05-18 NOTE — Telephone Encounter (Signed)
 Okay to schedule for OV  EGD 02/08/17: - Z- line irregular. Biopsied. - Gastritis. Biopsied. - Normal examined duodenum. Path: Gastritis without H pylori. GE junction with reflux esophagitis.  Colonoscopy 02/08/17:- Preparation of the colon was fair. Three HP polyps, two tubular adenomas. Diverticulosis, hemorrhoids.  Colonoscopy 05/05/22: - Six 4 to 5 mm polyps in the descending colon, in the ascending colon and in the cecum, removed with a cold snare. Resected and retrieved. - One diminutive polyp in the cecum, removed with a jumbo cold forceps. Resected and retrieved. - Severe diverticulosis in the recto- sigmoid colon and in the sigmoid colon. There was no evidence of diverticular bleeding. - Non- bleeding external and internal hemorrhoids. Path: TA

## 2023-05-19 ENCOUNTER — Encounter: Payer: Self-pay | Admitting: Internal Medicine

## 2023-05-19 ENCOUNTER — Ambulatory Visit: Admitting: Internal Medicine

## 2023-05-19 VITALS — BP 120/60 | HR 105 | Ht 66.0 in | Wt 224.8 lb

## 2023-05-19 DIAGNOSIS — Z794 Long term (current) use of insulin: Secondary | ICD-10-CM | POA: Diagnosis not present

## 2023-05-19 DIAGNOSIS — E66812 Obesity, class 2: Secondary | ICD-10-CM | POA: Diagnosis not present

## 2023-05-19 DIAGNOSIS — E1165 Type 2 diabetes mellitus with hyperglycemia: Secondary | ICD-10-CM | POA: Diagnosis not present

## 2023-05-19 DIAGNOSIS — Z6838 Body mass index (BMI) 38.0-38.9, adult: Secondary | ICD-10-CM

## 2023-05-19 DIAGNOSIS — E1159 Type 2 diabetes mellitus with other circulatory complications: Secondary | ICD-10-CM

## 2023-05-19 DIAGNOSIS — E785 Hyperlipidemia, unspecified: Secondary | ICD-10-CM | POA: Diagnosis not present

## 2023-05-19 DIAGNOSIS — Z7984 Long term (current) use of oral hypoglycemic drugs: Secondary | ICD-10-CM

## 2023-05-19 LAB — POCT GLYCOSYLATED HEMOGLOBIN (HGB A1C): Hemoglobin A1C: 7.7 % — AB (ref 4.0–5.6)

## 2023-05-19 LAB — LIPID PANEL W/REFLEX DIRECT LDL
Cholesterol: 149 mg/dL (ref <200)
HDL: 47 mg/dL — ABNORMAL LOW (ref >=50)
LDL Cholesterol (Calc): 71 mg/dL
Non-HDL Cholesterol (Calc): 102 mg/dL (ref <130)
Total CHOL/HDL Ratio: 3.2 (calc) (ref <5.0)
Triglycerides: 216 mg/dL — ABNORMAL HIGH (ref <150)

## 2023-05-19 NOTE — Patient Instructions (Addendum)
 Please continue: - Metformin  1000 mg 2x a day  Increase: - NPH 40 units in am and 30 units at bedtime   Please continue Levothyroxine  50 mcg daily.  Take the thyroid  hormone every day, with water, at least 30 minutes before breakfast, separated by at least 4 hours from: - acid reflux medications - calcium  - iron  - multivitamins  Please return in 3-4 months with your sugar log.

## 2023-05-19 NOTE — Progress Notes (Addendum)
 -Patient ID: Tamara [name], female   DOB: 08-28-1945, 78 y.o.   MRN: [id number]  HPI: Tamara [name] is a 78 y.o.-year-old female, returning for follow-up for DM2, dx in 2006, insulin -dependent, uncontrolled, with complications (aortic atherosclerosis) and hypothyroidism Pt. previously saw Dr. Washington Hacker, but last visit with me 5 months ago. She is the mother of Tamara [name], also my pt.  She is here with her husband.  Interim hx: No increased urination, blurry vision, nausea. She still has SOB after her COVID-pneumonia last fall.  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 She was on ABx and Steroids.  She still  has a hard time breathing despite being on oxygen and steroid inhalers.  DM2: Reviewed HbA1c: Lab Results  Component Value Date   HGBA1C 7.0 (A) 12/23/2022   HGBA1C 8.7 (H) 10/27/2022   HGBA1C 8.8 (H) 09/23/2022   HGBA1C 7.7 (A) 06/10/2022   HGBA1C 7.7 (A) 11/16/2021   HGBA1C 10.1 (A) 08/11/2021   HGBA1C 8.7 (A) 03/26/2021   HGBA1C 7.3 (A) 11/26/2020   HGBA1C 7.4 (A) 07/18/2020   HGBA1C 7.5 (A) 03/19/2020   HGBA1C 8.0 (A) 12/19/2019   HGBA1C 7.4 (A) 09/17/2019   HGBA1C 9.0 (A) 07/17/2019   HGBA1C 11.3 (A) 05/09/2019   HGBA1C 10.8 (H) 03/22/2019   HGBA1C 8.2 (H) 11/22/2018   HGBA1C 9.4 (H) 08/15/2018   HGBA1C 7.1 (H) 04/11/2018   HGBA1C 6.6 (H) 12/07/2017   HGBA1C 6.9 (H) 09/05/2017   HGBA1C 6.6 (H) 05/26/2017   HGBA1C 6.4 (H) 02/23/2017   HGBA1C 5.9 (H) 11/22/2016   HGBA1C 5.4 08/23/2016   Pt is on a regimen of: - Metformin  1000 mg 2x a day  - NPH  40 >> 36 units in am and 24 >> 28-30 units at bedtime She tried metformin  in the past but this caused arthralgias, however, she restarted as her CBGs were in the 400 She was previously on insulin  in 2017-2018, but came off after losing approximately 40 pounds. Per Dr. Jacqualyn Mates note, she previously declined multiple daily insulin  injections. We tried Farxiga  07/2021 >> $$$.  Pt checks her  sugars 2x a day and they are: - am: 130-169, 183, 187 >> 121-143 >> 82-123, 131 >> 95-120s, 160 - 2h after b'fast: n/c  - before lunch: 111, 133, 137, 151, 157 >> n/c >> 120-167 >> n/c - 2h after lunch: n/c  >> 146- 177 >> 131, 137, 162 >> n/c - before dinner: n/c >> 101, 104 >> n/c >> 110-154 >> 135-140 - 2h after dinner: n/c - bedtime: 164 >> n/c - nighttime: n/c Lowest sugar was 97 >> 101 >> 120 >> 82 >> 95; she has hypoglycemia awareness at 70.  Highest sugar was 400 >> 187 >> 167 >> 500s -steroid >> 200s She has been on steroids for Covid PNA >> 09-10/2022.  In house, she was septic and CBG increased to 500.  Glucometer: True Metrix  - no CKD, last BUN/creatinine:  Lab Results  Component Value Date   BUN 26 (H) 04/11/2023   BUN 5 (L) 10/27/2022   CREATININE 1.28 (H) 04/11/2023   CREATININE 1.02 (H) 10/27/2022   Lab Results  Component Value Date   MICRALBCREAT 6 12/23/2022   MICRALBCREAT 7 10/21/2021  She is not on ACE inhibitor/ARB.  -+ HL; last set of lipids: Lab Results  Component Value Date   CHOL 140 03/15/2022   HDL 50 03/15/2022   LDLCALC 65 03/15/2022   LDLDIRECT 64 03/15/2022   TRIG  148 03/15/2022   CHOLHDL 2.8 03/15/2022  On Crestor  20 mg and Zetia  10 daily.  - last eye exam was in 09/2021. No DR reportingly. + cataracts.  - no numbness and tingling in her feet.  Last foot exam 06/10/2022.  Reviewed latest imaging: CT chest (08/06/2021): Atherosclerosis of aorta, great vessels, and coronary arteries.  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  Of note, this mass was not FDG avid on PET scan.  Hypothyroidism:  She is on levothyroxine  50 mcg daily: - in am - fasting - at least 30 min from b'fast - no calcium  - no iron  - no multivitamins - + PPIs - Protonix  30 min later! >> moved >4h later - not on Biotin  Reviewed her latest TSH levels: Lab Results  Component Value Date   TSH 3.33 10/27/2022   TSH 3.34 08/26/2021    TSH 3.88 07/28/2020   TSH 2.24 11/15/2019   TSH 2.45 03/22/2019   TSH 4.62 (H) 11/22/2018   TSH 3.92 05/26/2017   She also has a history of HTN, GERD.  ROS: + see HPI  Past Medical History:  Diagnosis Date   Arthritis    knee and shoulders   Colon polyps 09/05/2017   COPD (chronic obstructive pulmonary disease) (HCC)    Diabetes mellitus without complication (HCC)    type II   Dyspnea    05/16/20 has had a cough for 2.5 years post Covid   Gastritis 09/05/2017   GERD (gastroesophageal reflux disease)    Hyperlipidemia    Hypertension    patient denies   Hypothyroidism    Panic attack    RUQ pain 03/09/2017   Per patient, she has had RUQ pain 4-5 years that has grown worse in the last few months.   Past Surgical History:  Procedure Laterality Date   BREAST BIOPSY Left 2008   CORE W/CLIP - NEG   BRONCHIAL BIOPSY  05/19/2020   Procedure: BRONCHIAL BIOPSIES;  Surgeon: Denson Flake, MD;  Location: [facility] ENDOSCOPY;  Service: Pulmonary;;   BRONCHIAL BRUSHINGS  05/19/2020   Procedure: BRONCHIAL BRUSHINGS;  Surgeon: Denson Flake, MD;  Location: [facility] ENDOSCOPY;  Service: Pulmonary;;   BRONCHIAL NEEDLE ASPIRATION BIOPSY  05/19/2020   Procedure: BRONCHIAL NEEDLE ASPIRATION BIOPSIES;  Surgeon: Denson Flake, MD;  Location: [facility] ENDOSCOPY;  Service: Pulmonary;;   BRONCHIAL WASHINGS  05/19/2020   Procedure: BRONCHIAL WASHINGS;  Surgeon: Denson Flake, MD;  Location: [facility] ENDOSCOPY;  Service: Pulmonary;;   COLONOSCOPY WITH PROPOFOL  N/A 02/08/2017   Procedure: COLONOSCOPY WITH PROPOFOL ;  Surgeon: Deveron Fly, MD;  Location: [facility] ENDOSCOPY;  Service: Endoscopy;  Laterality: N/A;   COLONOSCOPY WITH PROPOFOL  N/A 05/05/2022   Procedure: COLONOSCOPY WITH PROPOFOL ;  Surgeon: Selena Daily, MD;  Location: [facility] ENDOSCOPY;  Service: Gastroenterology;  Laterality: N/A;   ESOPHAGOGASTRODUODENOSCOPY (EGD) WITH PROPOFOL  N/A 02/08/2017   Procedure: ESOPHAGOGASTRODUODENOSCOPY (EGD) WITH PROPOFOL ;   Surgeon: Deveron Fly, MD;  Location: [facility] ENDOSCOPY;  Service: Endoscopy;  Laterality: N/A;   TUBAL LIGATION     VIDEO BRONCHOSCOPY WITH ENDOBRONCHIAL NAVIGATION N/A 05/19/2020   Procedure: VIDEO BRONCHOSCOPY WITH ENDOBRONCHIAL NAVIGATION;  Surgeon: Denson Flake, MD;  Location: [facility] ENDOSCOPY;  Service: Pulmonary;  Laterality: N/A;   Social History   Socioeconomic History   Marital status: Married    Spouse name: Melba Zopfi   Number of children: 3   Years of education: Not on file   Highest education level: High school graduate  Occupational History   Occupation: retired  Tobacco Use   Smoking status: Former    Current packs/day: 0.00    Average packs/day: 1 pack/day for 40.0 years (40.0 ttl pk-yrs)    Types: Cigarettes    Start date: 1966    Quit date: 2006    Years since quitting: 19.3   Smokeless tobacco: Never   Tobacco comments:    Smoking cessation materials not required  Vaping Use   Vaping status: Never Used  Substance and Sexual Activity   Alcohol use: No   Drug use: No   Sexual activity: Not on file  Other Topics Concern   Not on file  Social History Narrative   Not on file   Social Drivers of Health   Financial Resource Strain: Low Risk  (12/02/2022)   Overall Financial Resource Strain (CARDIA)    Difficulty of Paying Living Expenses: Not hard at all  Food Insecurity: No Food Insecurity (12/02/2022)   Hunger Vital Sign    Worried About Running Out of Food in the Last Year: Never true    Ran Out of Food in the Last Year: Never true  Transportation Needs: No Transportation Needs (12/02/2022)   PRAPARE - Administrator, Civil Service (Medical): No    Lack of Transportation (Non-Medical): No  Physical Activity: Insufficiently Active (12/02/2022)   Exercise Vital Sign    Days of Exercise per Week: 3 days    Minutes of Exercise per Session: 30 min  Stress: No Stress Concern Present (12/02/2022)   [employer] of Occupational Health  - Occupational Stress Questionnaire    Feeling of Stress : Not at all  Social Connections: Moderately Isolated (12/02/2022)   Social Connection and Isolation Panel [NHANES]    Frequency of Communication with Friends and Family: More than three times a week    Frequency of Social Gatherings with Friends and Family: Once a week    Attends Religious Services: Never    Database administrator or Organizations: No    Attends Banker Meetings: Never    Marital Status: Married  Catering manager Violence: Not At Risk (12/02/2022)   Humiliation, Afraid, Rape, and Kick questionnaire    Fear of Current or Ex-Partner: No    Emotionally Abused: No    Physically Abused: No    Sexually Abused: No   Current Outpatient Medications on File Prior to Visit  Medication Sig Dispense Refill   Accu-Chek Softclix Lancets lancets TEST BLOOD SUGAR TWO TIMES DAILY 200 each 3   albuterol  (PROVENTIL ) (2.5 MG/3ML) 0.083% nebulizer solution Take 3 mLs (2.5 mg total) by nebulization every 6 (six) hours as needed for wheezing or shortness of breath. 180 mL 5   albuterol  (VENTOLIN  HFA) 108 (90 Base) MCG/ACT inhaler Inhale 2 puffs into the lungs every 6 (six) hours as needed for wheezing or shortness of breath. 8 g 6   Alcohol Swabs (DROPSAFE ALCOHOL PREP) 70 % PADS USE AS DIRECTED TWO TIMES DAILY WHEN TESTING BLOOD SUGAR 200 each 3   Ascorbic Acid  (VITAMIN C ) 1000 MG tablet Take 1,000 mg by mouth daily.     aspirin  EC 81 MG tablet Take 1 tablet (81 mg total) by mouth daily. Swallow whole. 90 tablet 3   benzonatate  (TESSALON ) 200 MG capsule Take 1 capsule (200 mg total) by mouth 3 (three) times daily as needed. 45 capsule 1   Blood Glucose Monitoring Suppl (TRUE METRIX METER) w/Device KIT TEST BLOOD SUGAR TWICE DAILY 1 kit  3   Cholecalciferol  (VITAMIN D ) 50 MCG (2000 UT) CAPS Take 1,000 Units by mouth daily.     DROPLET PEN NEEDLES 32G X 4 MM MISC USE AS DIRECTED 200 each 3   ezetimibe  (ZETIA ) 10 MG tablet Take  1 tablet (10 mg total) by mouth daily. 90 tablet 1   famotidine  (PEPCID ) 20 MG tablet Take 1 tablet (20 mg total) by mouth 2 (two) times daily. 60 tablet 0   Ferrous Fumarate (HEMOCYTE - 106 MG FE) 324 (106 Fe) MG TABS tablet Take 1 tablet by mouth daily.     Ferrous Fumarate-DSS (FE CAPS/STOOL SOFTENER) 50-100 MG TABS Take 50-100 mg by mouth daily. 30 tablet 3   furosemide  (LASIX ) 40 MG tablet TAKE 1 TABLET EVERY DAY 90 tablet 1   hydrochlorothiazide  (MICROZIDE ) 12.5 MG capsule TAKE 1 CAPSULE EVERY DAY 90 capsule 0   levothyroxine  (SYNTHROID ) 50 MCG tablet TAKE 1 TABLET EVERY DAY BEFORE BREAKFAST 90 tablet 3   metFORMIN  (GLUCOPHAGE ) 1000 MG tablet TAKE 1 TABLET TWICE DAILY 180 tablet 3   metoCLOPramide  (REGLAN ) 10 MG tablet Take 1 tablet (10 mg total) by mouth every 6 (six) hours as needed. 30 tablet 0   NOVOLIN  N FLEXPEN 100 UNIT/ML FlexPen INJECT 36 UNITS UNDER THE SKIN IN THE MORNING AND 28 TO 30 UNITS AT BEDTIME 60 mL 3   nystatin  (MYCOSTATIN /NYSTOP ) powder Apply 1 Application topically daily as needed (yeast under belly). 60 g 2   ondansetron  (ZOFRAN ) 4 MG tablet Take 1 tablet (4 mg total) by mouth every 6 (six) hours as needed for nausea. 20 tablet 0   pantoprazole  (PROTONIX ) 40 MG tablet TAKE 1 TABLET TWICE DAILY 180 tablet 3   potassium chloride  (KLOR-CON  M) 10 MEQ tablet Take 1 tablet (10 mEq total) by mouth daily. 90 tablet 2   predniSONE  (DELTASONE ) 10 MG tablet 2 tabs daily for 7 days, then 1 tab for 7 days, then stop 21 tablet 0   rosuvastatin  (CRESTOR ) 20 MG tablet Take 1 tablet (20 mg total) by mouth at bedtime. 90 tablet 1   sucralfate  (CARAFATE ) 1 g tablet Take 1 tablet (1 g total) by mouth 4 (four) times daily. 120 tablet 1   Tiotropium Bromide -Olodaterol (STIOLTO RESPIMAT ) 2.5-2.5 MCG/ACT AERS Inhale 2 puffs into the lungs daily. 4 g 11   Tiotropium Bromide -Olodaterol (STIOLTO RESPIMAT ) 2.5-2.5 MCG/ACT AERS Inhale 2 puffs into the lungs daily.     TRUE METRIX BLOOD GLUCOSE TEST  test strip Use 2x a day 200 each 3   valsartan  (DIOVAN ) 80 MG tablet TAKE 1 TABLET (80 MG TOTAL) BY MOUTH DAILY. 90 tablet 3   No current facility-administered medications on file prior to visit.   Allergies  Allergen Reactions   Penicillins Hives    Tolerated rocephin  09/2022 Reaction: 1965   Family History  Problem Relation Age of Onset   Diabetes Maternal Grandmother    Breast cancer Neg Hx    PE: BP 120/60   Pulse (!) 105   Ht 5\' 6"  (1.676 m)   Wt 224 lb 12.8 oz (102 kg)   SpO2 96%   BMI 36.28 kg/m  Wt Readings from Last 3 Encounters:  05/19/23 224 lb 12.8 oz (102 kg)  05/13/23 226 lb 6.4 oz (102.7 kg)  04/28/23 224 lb 3.2 oz (101.7 kg)   Constitutional: overweight, in NAD Eyes: no exophthalmos ENT:  no thyromegaly, no cervical lymphadenopathy Cardiovascular: tachycardia, RR, No MRG, + B LE edema Respiratory: + Bilateral wheezes, crackles throughout  Musculoskeletal: no deformities Skin:  no rashes Neurological: no tremor with outstretched hands Diabetic Foot Exam - Simple   Simple Foot Form Diabetic Foot exam was performed with the following findings: Yes 05/19/2023  2:14 PM  Visual Inspection No deformities, no ulcerations, no other skin breakdown bilaterally: Yes Sensation Testing Intact to touch and monofilament testing bilaterally: Yes Pulse Check See comments: Yes Comments + B pitting edema Pulses not well palpable    ASSESSMENT: 1. DM2, insulin -dependent, uncontrolled, with  complications -Aortic atherosclerosis per PET scan from 10/22/2022  2.  Hypothyroidism  3. Hyperlipidemia  PLAN:  1. Patient with history of uncontrolled type 2 diabetes, on intermediate acting insulin  and metformin .  At last visit, HbA1c improved from 8.7% to 7.0% per best HbA1c in 5 years!  Sugars were at goal so we did not change her regimen. - At today's visit, sugars are higher especially later in the day, and the HbA1c is also higher than before (see below).  This is  possibly related to her worsening lung status and steroid courses.  We did discuss about possibly trying a GLP-1 receptor agonist but she declines this class of medication.  In fact, she would not want to add any other medication to her regimen.  In this case, I advised her to increase her morning NPH and continue with the higher dose of evening NPH and stay on the same dose of metformin . - I suggested to:  Patient Instructions  Please continue: - Metformin  1000 mg 2x a day  Increase: - NPH 40 units in am and 30 units at bedtime   Please continue Levothyroxine  50 mcg daily.  Take the thyroid  hormone every day, with water, at least 30 minutes before breakfast, separated by at least 4 hours from: - acid reflux medications - calcium  - iron  - multivitamins  Please return in 3-4 months with your sugar log.   - we checked her HbA1c: 7.7% (higher) - advised to check sugars at different times of the day - 2x a day, rotating check times - advised for yearly eye exams >> she is UTD - return to clinic in 3-4 months  2. Hypothyroidism: - latest thyroid  labs reviewed with pt. >> normal: Lab Results  Component Value Date   TSH 3.33 10/27/2022  - she continues on LT4 50 mcg daily - pt feels good on this dose. - we discussed about taking the thyroid  hormone every day, with water, >30 minutes before breakfast, separated by >4 hours from acid reflux medications, calcium , iron , multivitamins. Pt. is taking it correctly. - will check thyroid  tests at next visit-as she recently had steroids  3. HL - Reviewed latest lipid panel from 02/2022: All fractions at goal: Lab Results  Component Value Date   CHOL 140 03/15/2022   HDL 50 03/15/2022   LDLCALC 65 03/15/2022   LDLDIRECT 64 03/15/2022   TRIG 148 03/15/2022   CHOLHDL 2.8 03/15/2022  -She continues on Crestor  20 mg daily and Zetia  10 mg daily without side effects - She is due for another lipid panel-will check this today  Component      Latest Ref Rng 05/19/2023  Hemoglobin A1C     4.0 - 5.6 % 7.7 !   Cholesterol     <200 mg/dL 161   HDL Cholesterol     > OR = 50 mg/dL 47 (L)   Triglycerides     <150 mg/dL 096 (H)   LDL Cholesterol (Calc)     mg/dL (calc) 71  Total CHOL/HDL Ratio     <5.0 (calc) 3.2   Non-HDL Cholesterol (Calc)     <130 mg/dL (calc) 960   Triglycerides are elevated and LDL is also higher.  HDL is slightly low.  I will make sure that she is taking the Crestor  and Zetia  daily.  Her lipid panel was entirely at goal a year ago on the same regimen.  Emilie Harden, MD PhD [facility] Endocrinology

## 2023-05-20 ENCOUNTER — Ambulatory Visit: Admitting: Emergency Medicine

## 2023-05-20 ENCOUNTER — Ambulatory Visit (HOSPITAL_BASED_OUTPATIENT_CLINIC_OR_DEPARTMENT_OTHER)
Admission: RE | Admit: 2023-05-20 | Discharge: 2023-05-20 | Disposition: A | Discharge status: Home / Self Care | Source: Ambulatory Visit | Attending: Adult Health | Admitting: Adult Health

## 2023-05-20 DIAGNOSIS — J984 Other disorders of lung: Secondary | ICD-10-CM | POA: Insufficient documentation

## 2023-05-20 DIAGNOSIS — J189 Pneumonia, unspecified organism: Secondary | ICD-10-CM | POA: Diagnosis present

## 2023-05-23 NOTE — Progress Notes (Signed)
 Super D CT scan completed on 5/2.

## 2023-05-24 NOTE — Telephone Encounter (Signed)
 Has ov with Dr. Baldwin Levee  on 06/01/23

## 2023-05-30 ENCOUNTER — Encounter: Payer: Self-pay | Admitting: Internal Medicine

## 2023-06-01 ENCOUNTER — Ambulatory Visit: Admitting: Emergency Medicine

## 2023-06-01 ENCOUNTER — Encounter: Payer: Self-pay | Admitting: Emergency Medicine

## 2023-06-01 VITALS — BP 127/78 | HR 104 | Ht 66.0 in | Wt 225.8 lb

## 2023-06-01 DIAGNOSIS — J449 Chronic obstructive pulmonary disease, unspecified: Secondary | ICD-10-CM

## 2023-06-01 DIAGNOSIS — J441 Chronic obstructive pulmonary disease with (acute) exacerbation: Secondary | ICD-10-CM

## 2023-06-01 DIAGNOSIS — R9389 Abnormal findings on diagnostic imaging of other specified body structures: Secondary | ICD-10-CM

## 2023-06-01 MED ORDER — ALBUTEROL SULFATE (2.5 MG/3ML) 0.083% IN NEBU
2.5000 mg | INHALATION_SOLUTION | Freq: Four times a day (QID) | RESPIRATORY_TRACT | 5 refills | Status: AC | PRN
Start: 2023-06-01 — End: 2024-05-31

## 2023-06-01 MED ORDER — ALBUTEROL SULFATE HFA 108 (90 BASE) MCG/ACT IN AERS: 2.0000 | INHALATION_SPRAY | Freq: Four times a day (QID) | RESPIRATORY_TRACT | 6 refills | Status: AC | PRN

## 2023-06-01 NOTE — Assessment & Plan Note (Signed)
 Improved with treatment of her recent pneumonia.  Continue Stiolto and albuterol  as needed.

## 2023-06-01 NOTE — Progress Notes (Signed)
 Subjective:    Patient ID: Tamara [name], female    DOB: Oct 09, 1945, 78 y.o.   MRN: [id number]  HPI   ROV 01/27/2023 --follow-up visit 78 year old woman with COPD, chronic cough and abnormal CT scan of the chest.  She has an area of left lower lobe rounded atelectasis that was reassuring on bronchoscopy, then had another left lower lobe cavitary pneumonia.  I repeated a PET scan after she was discharged from the hospital 09/2022 and there was no hypermetabolic activity in the new left lower lobe opacity, consistent with a resolving pneumonia.  She had a reassuring swallow evaluation during that hospitalization.  She was sick in November and was treated again with doxycycline . She feels better, is doing well with less cough or SOB. She is on Stiolto, but ran out in October, has not restarted it. Uses albuterol  about 2-3x a day.   CT chest 01/14/2023 reviewed by me shows a significant interval decrease in the diffuse bilateral peribronchovascular opacities compared with prior, some patchy areas of bronchiectasis, no honeycomb change.  The left lower lobe rounded masslike opacity is again unchanged  ROV 06/01/2023 --78 year old woman with COPD and chronic cough.  I been following her for this as well as an abnormal CT scan of the chest.  There is an area of left lower lobe rounded atelectasis that was benign on bronchoscopy.  She has had waxing and waning associated pulmonary infiltrates since then, question recurrent aspiration pneumonia.  She has unfortunately had recurrent pneumonias since I last saw her.  Just finished antibiotics last month, does feel better, back to baseline.  Managed on Stiolto.  Denies any overt aspiration symptoms.  She does still have some intermittent cough  CT scan of the chest 05/20/2023 reviewed by me, shows large indistinct left lower lobe mass lesion similar in size and appearance with some internal groundglass and cystic change.  Moderate patchy.  Bronchovascular and  peripheral reticulation and groundglass opacity bilaterally.   Review of Systems As per HPI     Objective:   Physical Exam Vitals:   06/01/23 1551  BP: 127/78  Pulse: (!) 104  SpO2: 95%  Weight: 225 lb 12.8 oz (102.4 kg)  Height: 5\' 6"  (1.676 m)   Gen: Pleasant, overweight woman, in no distress,  normal affect  ENT: No lesions,  mouth clear,  oropharynx clear, no postnasal drip  Neck: No JVD, no stridor  Lungs: No use of accessory muscles, bilateral inspiratory squeaks, louder on the left  Cardiovascular: RRR, heart sounds normal, no murmur or gallops, no peripheral edem  Musculoskeletal: No deformities, no cyanosis or clubbing  Neuro: alert, awake, non focal  Skin: Warm, no lesions or rash     Assessment & Plan:  COPD (chronic obstructive pulmonary disease) (HCC) Improved with treatment of her recent pneumonia.  Continue Stiolto and albuterol  as needed.  Abnormal CT of the chest Significant abnormality in the left lower lobe as well as scattered bilateral scar.  She has a history of recurrent pneumonias, I suspect that the left lower lobe opacity is rounded atelectasis/scar.  She has had transbronchial biopsies 3 years ago that were negative for malignancy.  Still some concern that there could be cancer present.  Bronchoscopy may ultimately be beneficial both for cytology and for culture data.  We will start with a PET scan to better characterize the lesion.  Depending on those results we will decide about serial CT scans versus bronchoscopy.    Time spent 33 minutes  Racheal Buddle, MD, PhD 06/01/2023, 4:37 PM [facility] [phone] or if no answer before 7:00PM call [phone] For any issues after 7:00PM please call eLink [phone]

## 2023-06-01 NOTE — Assessment & Plan Note (Signed)
 Significant abnormality in the left lower lobe as well as scattered bilateral scar.  She has a history of recurrent pneumonias, I suspect that the left lower lobe opacity is rounded atelectasis/scar.  She has had transbronchial biopsies 3 years ago that were negative for malignancy.  Still some concern that there could be cancer present.  Bronchoscopy may ultimately be beneficial both for cytology and for culture data.  We will start with a PET scan to better characterize the lesion.  Depending on those results we will decide about serial CT scans versus bronchoscopy.

## 2023-06-01 NOTE — Patient Instructions (Signed)
 We will order a PET scan to better characterize your left lower lobe opacity on CT scan of the chest. Please continue your Stiolto 2 puffs once daily. Follow-up with Dr. Baldwin Levee next available after your PET scan so we can review those results together.  Depending on your imaging we can discuss the pros and cons of a repeat bronchoscopy.

## 2023-06-08 ENCOUNTER — Ambulatory Visit
Admission: RE | Admit: 2023-06-08 | Discharge: 2023-06-08 | Disposition: A | Discharge status: Home / Self Care | Source: Ambulatory Visit | Attending: Internal Medicine | Admitting: Internal Medicine

## 2023-06-08 DIAGNOSIS — Z780 Asymptomatic menopausal state: Secondary | ICD-10-CM | POA: Diagnosis present

## 2023-06-09 ENCOUNTER — Ambulatory Visit: Payer: Self-pay | Admitting: Internal Medicine

## 2023-06-09 ENCOUNTER — Other Ambulatory Visit (HOSPITAL_COMMUNITY)

## 2023-06-11 ENCOUNTER — Other Ambulatory Visit: Payer: Self-pay | Admitting: Internal Medicine

## 2023-06-11 DIAGNOSIS — I2510 Atherosclerotic heart disease of native coronary artery without angina pectoris: Secondary | ICD-10-CM

## 2023-06-14 ENCOUNTER — Ambulatory Visit (HOSPITAL_COMMUNITY)
Admission: RE | Admit: 2023-06-14 | Discharge: 2023-06-14 | Disposition: A | Discharge status: Home / Self Care | Source: Ambulatory Visit | Attending: Emergency Medicine | Admitting: Emergency Medicine

## 2023-06-14 DIAGNOSIS — R9389 Abnormal findings on diagnostic imaging of other specified body structures: Secondary | ICD-10-CM | POA: Diagnosis present

## 2023-06-14 LAB — GLUCOSE, CAPILLARY: Glucose-Capillary: 180 mg/dL — ABNORMAL HIGH (ref 70–99)

## 2023-06-14 MED ORDER — FLUDEOXYGLUCOSE F - 18 (FDG) INJECTION
11.2000 | Freq: Once | INTRAVENOUS | Status: AC
  Administered 2023-06-14: 11.2 via INTRAVENOUS

## 2023-06-14 NOTE — Telephone Encounter (Signed)
 Requested Prescriptions  Pending Prescriptions Disp Refills   ezetimibe  (ZETIA ) 10 MG tablet [Pharmacy Med Name: Ezetimibe  Oral Tablet 10 MG] 90 tablet 1    Sig: TAKE 1 TABLET EVERY DAY     Cardiovascular:  Antilipid - Sterol Transport Inhibitors Failed - 06/14/2023  5:53 PM      Failed - Valid encounter within last 12 months    Recent Outpatient Visits   None            Failed - Lipid Panel in normal range within the last 12 months    Cholesterol, Total  Date Value Ref Range Status  03/15/2022 140 100 - 199 mg/dL Final   Cholesterol  Date Value Ref Range Status  05/19/2023 149 <200 mg/dL Final   LDL Cholesterol (Calc)  Date Value Ref Range Status  05/19/2023 71 mg/dL (calc) Final    Comment:    Reference range: <100 . Desirable range <100 mg/dL for primary prevention;   <70 mg/dL for patients with CHD or diabetic patients  with > or = 2 CHD risk factors. Aaron Aas LDL-C is now calculated using the Martin-Hopkins  calculation, which is a validated novel method providing  better accuracy than the Friedewald equation in the  estimation of LDL-C.  Melinda Sprawls et al. Erroll Heard. 4098;119(14): 2061-2068  (http://education.[facility].com/faq/FAQ164)    LDL Direct  Date Value Ref Range Status  03/15/2022 64 0 - 99 mg/dL Final   HDL  Date Value Ref Range Status  05/19/2023 47 (L) > OR = 50 mg/dL Final  78/29/5621 50 >30 mg/dL Final   Triglycerides  Date Value Ref Range Status  05/19/2023 216 (H) <150 mg/dL Final    Comment:    . If a non-fasting specimen was collected, consider repeat triglyceride testing on a fasting specimen if clinically indicated.  Imagene Mam et al. J. of Clin. Lipidol. 2015;9:129-169. Aaron Aas          Passed - AST in normal range and within 360 days    AST  Date Value Ref Range Status  10/27/2022 13 10 - 35 U/L Final         Passed - ALT in normal range and within 360 days    ALT  Date Value Ref Range Status  10/27/2022 10 6 - 29 U/L Final          Passed - Patient is not pregnant

## 2023-06-22 ENCOUNTER — Other Ambulatory Visit: Payer: Self-pay

## 2023-06-22 DIAGNOSIS — R918 Other nonspecific abnormal finding of lung field: Secondary | ICD-10-CM

## 2023-06-28 ENCOUNTER — Encounter

## 2023-06-29 ENCOUNTER — Ambulatory Visit (INDEPENDENT_AMBULATORY_CARE_PROVIDER_SITE_OTHER): Admitting: Emergency Medicine

## 2023-06-29 ENCOUNTER — Encounter: Payer: Self-pay | Admitting: Emergency Medicine

## 2023-06-29 ENCOUNTER — Telehealth: Payer: Self-pay | Admitting: Emergency Medicine

## 2023-06-29 VITALS — BP 126/64 | HR 99 | Temp 97.6°F | Ht 66.0 in | Wt 224.6 lb

## 2023-06-29 DIAGNOSIS — J449 Chronic obstructive pulmonary disease, unspecified: Secondary | ICD-10-CM

## 2023-06-29 DIAGNOSIS — R9389 Abnormal findings on diagnostic imaging of other specified body structures: Secondary | ICD-10-CM

## 2023-06-29 NOTE — Telephone Encounter (Signed)
 Letter given by [employer] Case# [id number]

## 2023-06-29 NOTE — Telephone Encounter (Signed)
 Need to set up bronchoscopy to evaluate enlarging left lower lobe opacity.  Discussed with the patient today.  She agrees to bronchoscopy, wants to do this in mid July.  Will try to get this scheduled today.   Please schedule the following:  Provider performing procedure: Lavelle Berland Diagnosis: Left lower lobe opacity Which side if for nodule / mass?  Left Procedure: Robotic assisted navigational bronchoscopy Has patient been spoken to by Provider and given informed consent?  Yes Anesthesia: General Do you need Fluro?  Yes Duration of procedure: 30 minutes Date: 08/01/2023 Alternate Date: 08/02/2023 Time: Any Location: [facility] Does patient have OSA?  No DM?  Yes or Latex allergy?  No Medication Restriction/ Anticoagulate/Antiplatelet: Stop aspirin  2 days prior Pre-op Labs Ordered:determined by Anesthesia Imaging request: CT chest available in PACS (If, SuperDimension CT Chest, please have STAT courier sent to ENDO)

## 2023-06-29 NOTE — Progress Notes (Signed)
 Subjective:    Patient ID: Tamara [name], female    DOB: 1945-12-25, 78 y.o.   MRN: [id number]  HPI   ROV 06/01/2023 --78 year old woman with COPD and chronic cough.  I been following her for this as well as an abnormal CT scan of the chest.  There is an area of left lower lobe rounded atelectasis that was benign on bronchoscopy.  She has had waxing and waning associated pulmonary infiltrates since then, question recurrent aspiration pneumonia.  She has unfortunately had recurrent pneumonias since I last saw her.  Just finished antibiotics last month, does feel better, back to baseline.  Managed on Stiolto.  Denies any overt aspiration symptoms.  She does still have some intermittent cough  CT scan of the chest 05/20/2023 reviewed by me, shows large indistinct left lower lobe mass lesion similar in size and appearance with some internal groundglass and cystic change.  Moderate patchy.  Bronchovascular and peripheral reticulation and groundglass opacity bilaterally.  ROV 06/29/2023 --follow-up visit for Tamara [name].  She 84 with COPD and chronic cough as well as a history of recurrent pneumonias and an abnormal CT scan of the chest.  She has rounded areas in the left lower lobe consistent with rounded atelectasis.  These were negative on bronchoscopy and transbronchial biopsies about 3 years ago.  Her most recent CT from 1 month ago showed some slight interval change prompting PET scan to better characterize.  PET scan 06/14/2023 reviewed by me, shows the chronic persistent airspace opacity in the left lower lobe that is slightly increased in size compared with priors.  There is also some slightly increased hypermetabolic activity with an SUV max of 6.0 in the more superior aspect.  Remains consistent with possible infectious process or neoplasm   Review of Systems As per HPI     Objective:   Physical Exam Vitals:   06/29/23 1315  BP: 126/64  Pulse: 99  Temp: 97.6 F (36.4 C)  TempSrc:  Temporal  SpO2: 96%  Weight: 224 lb 9.6 oz (101.9 kg)  Height: 5' 6 (1.676 m)   Gen: Pleasant, overweight woman, in no distress,  normal affect  ENT: No lesions,  mouth clear,  oropharynx clear, no postnasal drip  Neck: No JVD, no stridor  Lungs: No use of accessory muscles, bilateral inspiratory squeaks, louder on the left  Cardiovascular: RRR, heart sounds normal, no murmur or gallops, no peripheral edem  Musculoskeletal: No deformities, no cyanosis or clubbing  Neuro: alert, awake, non focal  Skin: Warm, no lesions or rash     Assessment & Plan:  Abnormal CT of the chest Left lower lobe opacity that has been consistent with rounded atelectasis both in appearance and on prior bronchoscopy.  Now with some increased opacity a bit more superior.  The PET scan shows this area to be moderately hypermetabolic.  Etiology remains unclear, question persistent smoldering infection.  Discussed with her today that we cannot rule out malignancy even though her prior bronchoscopy was reassuring.  We will plan for repeat bronchoscopy to sample this area.  We reviewed your PET scan today. We will plan to arrange for bronchoscopy to evaluate your left lower lobe.  This will be done under general anesthesia as an outpatient at [facility] endoscopy.  Will try to get this set up for mid July.  You will need a designated driver and someone to watch you that day at home after the procedure.  You will need to stop your aspirin  2 days  prior. Follow-up in our office about 1 week after the procedure so we can review the results together.  COPD (chronic obstructive pulmonary disease) (HCC) Continue Stiolto, albuterol  as needed   Time spent 30 minutes    Racheal Buddle, MD, PhD 06/29/2023, 4:43 PM [facility] [phone] or if no answer before 7:00PM call [phone] For any issues after 7:00PM please call eLink [phone]

## 2023-06-29 NOTE — Assessment & Plan Note (Signed)
 Left lower lobe opacity that has been consistent with rounded atelectasis both in appearance and on prior bronchoscopy.  Now with some increased opacity a bit more superior.  The PET scan shows this area to be moderately hypermetabolic.  Etiology remains unclear, question persistent smoldering infection.  Discussed with her today that we cannot rule out malignancy even though her prior bronchoscopy was reassuring.  We will plan for repeat bronchoscopy to sample this area.  We reviewed your PET scan today. We will plan to arrange for bronchoscopy to evaluate your left lower lobe.  This will be done under general anesthesia as an outpatient at [facility] endoscopy.  Will try to get this set up for mid July.  You will need a designated driver and someone to watch you that day at home after the procedure.  You will need to stop your aspirin  2 days prior. Follow-up in our office about 1 week after the procedure so we can review the results together.

## 2023-06-29 NOTE — Assessment & Plan Note (Signed)
Continue Stiolto, albuterol as needed. 

## 2023-06-29 NOTE — Patient Instructions (Signed)
 We reviewed your PET scan today. We will plan to arrange for bronchoscopy to evaluate your left lower lobe.  This will be done under general anesthesia as an outpatient at [facility] endoscopy.  Will try to get this set up for mid July.  You will need a designated driver and someone to watch you that day at home after the procedure.  You will need to stop your aspirin  2 days prior. Follow-up in our office about 1 week after the procedure so we can review the results together.

## 2023-07-01 ENCOUNTER — Ambulatory Visit: Admitting: Internal Medicine

## 2023-07-11 ENCOUNTER — Other Ambulatory Visit: Payer: Self-pay | Admitting: Internal Medicine

## 2023-07-11 DIAGNOSIS — E1165 Type 2 diabetes mellitus with hyperglycemia: Secondary | ICD-10-CM

## 2023-07-13 NOTE — Telephone Encounter (Signed)
 Called patient to discuss rescheduling of 6/13 appointment in September.

## 2023-07-26 ENCOUNTER — Ambulatory Visit: Payer: Self-pay | Admitting: Emergency Medicine

## 2023-07-26 NOTE — Telephone Encounter (Signed)
 FYI Only or Action Required?: Action required by provider: request for appointment and update on patient condition.  Patient is followed in Pulmonology for COPD, last seen on 06/29/2023 by Shelah Lamar RAMAN, MD.  Called Nurse Triage reporting Shortness of Breath.  Symptoms began 2 days ago.  Interventions attempted: Prescription medications: Albuterol , Rescue inhaler, Maintenance inhaler, Nebulizer treatments, and Home oxygen use.  Symptoms are: gradually worsening.  Triage Disposition: See HCP Within 4 Hours (Or PCP Triage)  Patient/caregiver understands and will follow disposition?: No, refuses disposition                 Copied from CRM [id number]. Topic: Clinical - Red Word Triage >> Jul 26, 2023  2:37 PM Tamara [name] wrote: Red Word that prompted transfer to Nurse Triage: Patient thinks she had pneumonia again - lungs are in pain & chest feels like its on fire. Reason for Disposition  [1] Longstanding difficulty breathing (e.g., CHF, COPD, emphysema) AND [2] WORSE than normal  Answer Assessment - Initial Assessment Questions E2C2 Pulmonary Triage - Initial Assessment Questions Chief Complaint (e.g., cough, sob, wheezing, fever, chills, sweat or additional symptoms) *Go to specific symptom protocol after initial questions. Shortness of breath, lungs feel like they are on fire, states left lung feels worse--especially on exertion  How long have symptoms been present? 2 days  Have you tested for COVID or Flu? Note: If not, ask patient if [name] home test can be taken. If so, instruct patient to call back for positive results. No  MEDICINES:   Have you used any OTC meds to help with symptoms? No If yes, ask What medications? N/[name]  Have you used your inhalers/maintenance medication? Yes If yes, What medications? Albuterol  Nebulizer--Take 3 mLs (2.5 mg total) by nebulization every 6 (six) hours as needed for wheezing or shortness of breath. Albuterol   Inhaler-- Inhale 2 puffs into the lungs every 6 (six) hours as needed for wheezing or shortness of breath.   If inhaler, ask How many puffs and how often? Note: Review instructions on medication in the chart. Albuterol  Nebulizer--Take 3 mLs (2.5 mg total) by nebulization every 6 (six) hours as needed for wheezing or shortness of breath. Albuterol  Inhaler-- Inhale 2 puffs into the lungs every 6 (six) hours as needed for wheezing or shortness of breath.  OXYGEN: Do you wear supplemental oxygen? Yes If yes, How many liters are you supposed to use? Patient states she's been on oxygen for the past 3 days---pt has been using 2 liters  Do you monitor your oxygen levels? No If yes, What is your reading (oxygen level) today? 95%  What is your usual oxygen saturation reading?  (Note: Pulmonary O2 sats should be 90% or greater) -----     3. PATTERN Does the difficult breathing come and go, or has it been constant since it started?      Worse on exertion 4. SEVERITY: How bad is your breathing? (e.g., mild, moderate, severe)    - MILD: No SOB at rest, mild SOB with walking, speaks normally in sentences, can lie down, no retractions, pulse < 100.    - MODERATE: SOB at rest, SOB with minimal exertion and prefers to sit, cannot lie down flat, speaks in phrases, mild retractions, audible wheezing, pulse 100-120.    - SEVERE: Very SOB at rest, speaks in single words, struggling to breathe, sitting hunched forward, retractions, pulse > 120      Mild-moderate 5. RECURRENT SYMPTOM: Have you had difficulty breathing  before? If Yes, ask: When was the last time? and What happened that time?      ------- 6. CARDIAC HISTORY: Do you have any history of heart disease? (e.g., heart attack, angina, bypass surgery, angioplasty)      No per patient 7. LUNG HISTORY: Do you have any history of lung disease?  (e.g., pulmonary embolus, asthma, emphysema)     COPD 8. CAUSE: What do you think  is causing the breathing problem?      Pneumonia 9. OTHER SYMPTOMS: Do you have any other symptoms? (e.g., dizziness, runny nose, cough, chest pain, fever)     Cough---yellow   (patient states this is usual)    Patient is advised that right now the recommendation is that she is seen and evaluated by [name] provider in the next 4 hours---She is then advised that the Pulmonary office did not have any openings. This RN asked the patient about her primary care office & she states that they would just send her across the street for the chest x ray and nothing would be found. This RN then recommended Urgent Care or the Emergency Room , especially if she gets any worse. Patient states that won't do any good because she would be waiting all day and not have her Oxygen with her. This RN advised her that I would send this message to her Pulmonology Team and in the meantime if she gets worse to go to the Emergency Room.  Protocols used: Breathing Difficulty-[name]-AH

## 2023-07-26 NOTE — Telephone Encounter (Signed)
 Spoke with patient regarding prior message. Partient has been having SOB/Left lung pain for 2 days . Patient has been scheduled with Candis. Patient's voice was understanding.Nothing else  further needed.

## 2023-07-27 ENCOUNTER — Ambulatory Visit (HOSPITAL_BASED_OUTPATIENT_CLINIC_OR_DEPARTMENT_OTHER)

## 2023-07-27 ENCOUNTER — Other Ambulatory Visit (HOSPITAL_BASED_OUTPATIENT_CLINIC_OR_DEPARTMENT_OTHER): Payer: Self-pay

## 2023-07-27 ENCOUNTER — Encounter (HOSPITAL_BASED_OUTPATIENT_CLINIC_OR_DEPARTMENT_OTHER): Payer: Self-pay

## 2023-07-27 VITALS — BP 137/58 | HR 96 | Temp 98.1°F | Ht 66.0 in | Wt 224.0 lb

## 2023-07-27 DIAGNOSIS — I700 Atherosclerosis of aorta: Secondary | ICD-10-CM | POA: Diagnosis not present

## 2023-07-27 DIAGNOSIS — R0989 Other specified symptoms and signs involving the circulatory and respiratory systems: Secondary | ICD-10-CM | POA: Diagnosis not present

## 2023-07-27 DIAGNOSIS — R9389 Abnormal findings on diagnostic imaging of other specified body structures: Secondary | ICD-10-CM

## 2023-07-27 DIAGNOSIS — R058 Other specified cough: Secondary | ICD-10-CM | POA: Diagnosis not present

## 2023-07-27 DIAGNOSIS — R042 Hemoptysis: Secondary | ICD-10-CM

## 2023-07-27 DIAGNOSIS — J449 Chronic obstructive pulmonary disease, unspecified: Secondary | ICD-10-CM

## 2023-07-27 DIAGNOSIS — R918 Other nonspecific abnormal finding of lung field: Secondary | ICD-10-CM

## 2023-07-27 MED ORDER — HYDROCODONE BIT-HOMATROP MBR 5-1.5 MG/5ML PO SOLN
5.0000 mL | Freq: Four times a day (QID) | ORAL | 0 refills | Status: DC | PRN
  Filled 2023-07-27: qty 120, 6d supply, fill #0

## 2023-07-27 NOTE — H&P (View-Only) (Signed)
 @Patient  ID: Tamara [name], female    DOB: 02/14/1945, 78 y.o.   MRN: [id number]  No chief complaint on file.   Referring provider: Bernardo Fend, DO  HPI: Patient is a 78 y/o female with PMH of COPD and LLL opacity consistent with rounded atelectasis both in appearance and on prior bronchoscopy who presents today with c/o cough and left lower chest discomfort for three days.  She was last seen in clinic on 06/29/2023; PET scan was reviewed with her at that point which revealed an SUV max of 6.0 in the more superior aspect of the persistent airspace opacity.  Workup until this point, including biopsies, have been negative for malignancy.  Bronchoscopy is planned for 08/01/2023 based on the PET results and changes in the size of the previously noted opacity.  Today, she reports that she has had a cough productive of small amounts of blood tinged sputum, but no bright or dark blood.  She states that the sputum itself is light pink, but not frothy.  She also c/o some left lower chest/flank discomfort with coughing.  She has not taken anything for pain management.  She denies fever, chills, HA, dizziness, lightheadedness, or other c/o.    TEST/EVENTS : bronchoscopy scheduled for 08/01/2023  Allergies  Allergen Reactions   Penicillins Hives    Tolerated rocephin  09/2022 Reaction: 1965    Immunization History  Administered Date(s) Administered   Fluad Quad(high Dose 65+) 09/20/2018, 10/16/2019, 10/16/2020   Influenza, High Dose Seasonal PF 10/16/2019   Influenza,inj,Quad PF,6+ Mos 10/18/2016, 09/29/2017, 10/20/2021   Pneumococcal Conjugate-13 10/16/2019   Pneumococcal Polysaccharide-23 03/26/2013    Past Medical History:  Diagnosis Date   Arthritis    knee and shoulders   Colon polyps 09/05/2017   COPD (chronic obstructive pulmonary disease) (HCC)    Diabetes mellitus without complication (HCC)    type II   Dyspnea    05/16/20 has had a cough for 2.5 years post Covid    Gastritis 09/05/2017   GERD (gastroesophageal reflux disease)    Hyperlipidemia    Hypertension    patient denies   Hypothyroidism    Panic attack    RUQ pain 03/09/2017   Per patient, she has had RUQ pain 4-5 years that has grown worse in the last few months.    Tobacco History: Social History   Tobacco Use  Smoking Status Former   Current packs/day: 0.00   Average packs/day: 1 pack/day for 40.0 years (40.0 ttl pk-yrs)   Types: Cigarettes   Start date: 36   Quit date: 2006   Years since quitting: 19.5  Smokeless Tobacco Never  Tobacco Comments   Smoking cessation materials not required   Counseling given: Not Answered Tobacco comments: Smoking cessation materials not required   Outpatient Medications Prior to Visit  Medication Sig Dispense Refill   Accu-Chek Softclix Lancets lancets TEST BLOOD SUGAR TWO TIMES DAILY 200 each 3   albuterol  (PROVENTIL ) (2.5 MG/3ML) 0.083% nebulizer solution Take 3 mLs (2.5 mg total) by nebulization every 6 (six) hours as needed for wheezing or shortness of breath. 180 mL 5   albuterol  (VENTOLIN  HFA) 108 (90 Base) MCG/ACT inhaler Inhale 2 puffs into the lungs every 6 (six) hours as needed for wheezing or shortness of breath. 8 g 6   Alcohol Swabs (DROPSAFE ALCOHOL PREP) 70 % PADS USE AS DIRECTED TWO TIMES DAILY WHEN TESTING BLOOD SUGAR 200 each 3   Ascorbic Acid  (VITAMIN C ) 1000 MG tablet Take 1,000 mg by  mouth daily.     aspirin  EC 81 MG tablet Take 1 tablet (81 mg total) by mouth daily. Swallow whole. 90 tablet 3   benzonatate  (TESSALON ) 200 MG capsule Take 1 capsule (200 mg total) by mouth 3 (three) times daily as needed. 45 capsule 1   Blood Glucose Monitoring Suppl (TRUE METRIX METER) w/Device KIT TEST BLOOD SUGAR TWICE DAILY 1 kit 3   Cholecalciferol  (VITAMIN D ) 50 MCG (2000 UT) CAPS Take 1,000 Units by mouth daily.     DROPLET PEN NEEDLES 32G X 4 MM MISC USE AS DIRECTED 200 each 3   ezetimibe  (ZETIA ) 10 MG tablet TAKE 1 TABLET EVERY  DAY 90 tablet 1   Ferrous Fumarate (HEMOCYTE - 106 MG FE) 324 (106 Fe) MG TABS tablet Take 1 tablet by mouth daily.     Ferrous Fumarate-DSS (FE CAPS/STOOL SOFTENER) 50-100 MG TABS Take 50-100 mg by mouth daily. 30 tablet 3   furosemide  (LASIX ) 40 MG tablet TAKE 1 TABLET EVERY DAY 90 tablet 1   hydrochlorothiazide  (MICROZIDE ) 12.5 MG capsule TAKE 1 CAPSULE EVERY DAY 90 capsule 0   Insulin  NPH, Human,, Isophane, (NOVOLIN  N FLEXPEN) 100 UNIT/ML Kiwkpen INJECT 40 UNITS UNDER THE SKIN IN THE MORNING AND 30 TO 30 UNITS AT BEDTIME 75 mL 3   levothyroxine  (SYNTHROID ) 50 MCG tablet TAKE 1 TABLET EVERY DAY BEFORE BREAKFAST 90 tablet 3   metFORMIN  (GLUCOPHAGE ) 1000 MG tablet TAKE 1 TABLET TWICE DAILY 180 tablet 3   metoCLOPramide  (REGLAN ) 10 MG tablet Take 1 tablet (10 mg total) by mouth every 6 (six) hours as needed. 30 tablet 0   nystatin  (MYCOSTATIN /NYSTOP ) powder Apply 1 Application topically daily as needed (yeast under belly). 60 g 2   pantoprazole  (PROTONIX ) 40 MG tablet TAKE 1 TABLET TWICE DAILY 180 tablet 3   potassium chloride  (KLOR-CON  M) 10 MEQ tablet Take 1 tablet (10 mEq total) by mouth daily. 90 tablet 2   rosuvastatin  (CRESTOR ) 20 MG tablet Take 1 tablet (20 mg total) by mouth at bedtime. 90 tablet 1   sucralfate  (CARAFATE ) 1 g tablet Take 1 tablet (1 g total) by mouth 4 (four) times daily. 120 tablet 1   Tiotropium Bromide -Olodaterol (STIOLTO RESPIMAT ) 2.5-2.5 MCG/ACT AERS Inhale 2 puffs into the lungs daily. 4 g 11   TRUE METRIX BLOOD GLUCOSE TEST test strip Use 2x a day 200 each 3   valsartan  (DIOVAN ) 80 MG tablet TAKE 1 TABLET (80 MG TOTAL) BY MOUTH DAILY. 90 tablet 3   No facility-administered medications prior to visit.     Review of Systems:   Constitutional:   No  weight loss, night sweats,  Fevers, chills,  or  lassitude.  +fatigue  HEENT:   No headaches,  Difficulty swallowing,  Tooth/dental problems, or  Sore throat,                No sneezing, itching, ear ache, nasal  congestion, post nasal drip,   CV:  No chest pain,  Orthopnea, PND, swelling in lower extremities, anasarca, dizziness, palpitations, syncope.   GI  No heartburn, indigestion, abdominal pain, nausea, vomiting, diarrhea, change in bowel habits, loss of appetite, bloody stools.   Resp: Positive for productive cough at baseline, now with pink sputum.  Reports chest wall discomfort along the ribs on both the right and left; worse with coughing.  No chest wall deformity  Skin: no rash or lesions.  GU: no dysuria, change in color of urine, no urgency or frequency.  No flank  pain, no hematuria   MS:  No joint pain or swelling.  No decreased range of motion.  No back pain.    Physical Exam  BP (!) 137/58 (BP Location: Left Arm, Patient Position: Sitting, Cuff Size: Normal)   Pulse 96   Temp 98.1 F (36.7 C)   Ht 5' 6 (1.676 m)   Wt 224 lb (101.6 kg)   SpO2 95%   BMI 36.15 kg/m   GEN: A/Ox3; appears tired, well nourished    HEENT:  [place]/AT,  EACs-clear, TMs-wnl, NOSE-clear, THROAT-clear, no lesions, no postnasal drip or exudate noted.   NECK:  Supple w/ fair ROM; no JVD; normal carotid impulses w/o bruits; no thyromegaly or nodules palpated; no lymphadenopathy.    RESP  LLL and LML with expiratory wheezes and few crackles; otherwise fairly good aeration throughout both lungs anteriorly and posteriorly. no accessory muscle use, no dullness to percussion  CARD:  RRR, no m/r/g, trace peripheral edema b/l, pulses intact, no cyanosis or clubbing.  Chest wall discomfort elicited with palpation of the ribs bilaterally  GI:   Soft & nt; nml bowel sounds; no organomegaly or masses detected.   Musco: Warm bil, no deformities or joint swelling noted. Trace peripheral edema bilaterally  Neuro: alert, no focal deficits noted.    Skin: Warm, no lesions or rashes    Lab Results:  CBC    Component Value Date/Time   WBC 11.9 (H) 04/11/2023 1715   RBC 4.03 04/11/2023 1715   HGB 10.2 (L)  04/11/2023 1715   HCT 31.7 (L) 04/11/2023 1715   PLT 216 04/11/2023 1715   MCV 78.7 (L) 04/11/2023 1715   MCH 25.3 (L) 04/11/2023 1715   MCHC 32.2 04/11/2023 1715   RDW 15.9 (H) 04/11/2023 1715   LYMPHSABS 1,128 10/27/2022 1106   MONOABS 0.2 09/23/2022 1159   EOSABS 281 10/27/2022 1106   BASOSABS 50 10/27/2022 1106    BMET    Component Value Date/Time   NA 135 04/11/2023 1715   NA 140 05/27/2022 1151   K 4.6 04/11/2023 1715   CL 101 04/11/2023 1715   CO2 20 (L) 04/11/2023 1715   GLUCOSE 167 (H) 04/11/2023 1715   BUN 26 (H) 04/11/2023 1715   BUN 17 05/27/2022 1151   CREATININE 1.28 (H) 04/11/2023 1715   CREATININE 1.02 (H) 10/27/2022 1106   CALCIUM  10.4 (H) 04/11/2023 1715   GFRNONAA 43 (L) 04/11/2023 1715   GFRNONAA 63 11/15/2019 1523   GFRAA 73 11/15/2019 1523    BNP    Component Value Date/Time   BNP 33.7 09/29/2022 0621   BNP 16 10/21/2021 1431    ProBNP No results found for: PROBNP  Imaging: No results found.  Administration History     None          Latest Ref Rng & Units 08/18/2021    9:55 AM  PFT Results  FVC-Pre L 2.27   FVC-Predicted Pre % 74   FVC-Post L 2.28   FVC-Predicted Post % 75   Pre FEV1/FVC % % 84   Post FEV1/FCV % % 85   FEV1-Pre L 1.91   FEV1-Predicted Pre % 83   FEV1-Post L 1.94   DLCO uncorrected ml/min/mmHg 14.64   DLCO UNC% % 72   DLCO corrected ml/min/mmHg 16.16   DLCO COR %Predicted % 80   DLVA Predicted % 102   TLC L 4.19   TLC % Predicted % 79   RV % Predicted % 78  No results found for: NITRICOXIDE      Assessment & Plan:   Encounter Diagnoses  Name Primary?   Abnormal CT of the chest Yes   Lung mass    Cough with hemoptysis    Chronic obstructive pulmonary disease, unspecified COPD type (HCC)      Cough with pink sputum production: -  Likely due to inflammation from coughing, however, cannot r/o a recurrent pneumonia -  Check Chest XR; intervention pending results.   -  Hycodan Q6hprn for  pain/cough management  Abnormal PET CT -  Bronchoscopy as scheduled to further evaluate  COPD: -  Continue Stiolto -  Continue Albuterol  nebs as needed    Candis Dandy, PA-C 07/27/2023

## 2023-07-27 NOTE — Patient Instructions (Addendum)
 Hycodan as prescribed for pain management.  Continue nebulizers as needed for shortness of breath.  Go to ED if coughing up bright red blood, new fever, worsening shortness of breath.  Complete Chest XR as ordered.  Bronchoscopy to be completed as scheduled.

## 2023-07-27 NOTE — Progress Notes (Signed)
 @Patient  ID: Tamara [name], female    DOB: 1945/07/27, 78 y.o.   MRN: [id number]  No chief complaint on file.   Referring provider: Bernardo Fend, DO  HPI: Patient is a 78 y/o female with PMH of COPD and LLL opacity consistent with rounded atelectasis both in appearance and on prior bronchoscopy who presents today with c/o cough and left lower chest discomfort for three days.  She was last seen in clinic on 06/29/2023; PET scan was reviewed with her at that point which revealed an SUV max of 6.0 in the more superior aspect of the persistent airspace opacity.  Workup until this point, including biopsies, have been negative for malignancy.  Bronchoscopy is planned for 08/01/2023 based on the PET results and changes in the size of the previously noted opacity.  Today, she reports that she has had a cough productive of small amounts of blood tinged sputum, but no bright or dark blood.  She states that the sputum itself is light pink, but not frothy.  She also c/o some left lower chest/flank discomfort with coughing.  She has not taken anything for pain management.  She denies fever, chills, HA, dizziness, lightheadedness, or other c/o.    TEST/EVENTS : bronchoscopy scheduled for 08/01/2023  Allergies  Allergen Reactions   Penicillins Hives    Tolerated rocephin  09/2022 Reaction: 1965    Immunization History  Administered Date(s) Administered   Fluad Quad(high Dose 65+) 09/20/2018, 10/16/2019, 10/16/2020   Influenza, High Dose Seasonal PF 10/16/2019   Influenza,inj,Quad PF,6+ Mos 10/18/2016, 09/29/2017, 10/20/2021   Pneumococcal Conjugate-13 10/16/2019   Pneumococcal Polysaccharide-23 03/26/2013    Past Medical History:  Diagnosis Date   Arthritis    knee and shoulders   Colon polyps 09/05/2017   COPD (chronic obstructive pulmonary disease) (HCC)    Diabetes mellitus without complication (HCC)    type II   Dyspnea    05/16/20 has had a cough for 2.5 years post Covid    Gastritis 09/05/2017   GERD (gastroesophageal reflux disease)    Hyperlipidemia    Hypertension    patient denies   Hypothyroidism    Panic attack    RUQ pain 03/09/2017   Per patient, she has had RUQ pain 4-5 years that has grown worse in the last few months.    Tobacco History: Social History   Tobacco Use  Smoking Status Former   Current packs/day: 0.00   Average packs/day: 1 pack/day for 40.0 years (40.0 ttl pk-yrs)   Types: Cigarettes   Start date: 57   Quit date: 2006   Years since quitting: 19.5  Smokeless Tobacco Never  Tobacco Comments   Smoking cessation materials not required   Counseling given: Not Answered Tobacco comments: Smoking cessation materials not required   Outpatient Medications Prior to Visit  Medication Sig Dispense Refill   Accu-Chek Softclix Lancets lancets TEST BLOOD SUGAR TWO TIMES DAILY 200 each 3   albuterol  (PROVENTIL ) (2.5 MG/3ML) 0.083% nebulizer solution Take 3 mLs (2.5 mg total) by nebulization every 6 (six) hours as needed for wheezing or shortness of breath. 180 mL 5   albuterol  (VENTOLIN  HFA) 108 (90 Base) MCG/ACT inhaler Inhale 2 puffs into the lungs every 6 (six) hours as needed for wheezing or shortness of breath. 8 g 6   Alcohol Swabs (DROPSAFE ALCOHOL PREP) 70 % PADS USE AS DIRECTED TWO TIMES DAILY WHEN TESTING BLOOD SUGAR 200 each 3   Ascorbic Acid  (VITAMIN C ) 1000 MG tablet Take 1,000 mg by  mouth daily.     aspirin  EC 81 MG tablet Take 1 tablet (81 mg total) by mouth daily. Swallow whole. 90 tablet 3   benzonatate  (TESSALON ) 200 MG capsule Take 1 capsule (200 mg total) by mouth 3 (three) times daily as needed. 45 capsule 1   Blood Glucose Monitoring Suppl (TRUE METRIX METER) w/Device KIT TEST BLOOD SUGAR TWICE DAILY 1 kit 3   Cholecalciferol  (VITAMIN D ) 50 MCG (2000 UT) CAPS Take 1,000 Units by mouth daily.     DROPLET PEN NEEDLES 32G X 4 MM MISC USE AS DIRECTED 200 each 3   ezetimibe  (ZETIA ) 10 MG tablet TAKE 1 TABLET EVERY  DAY 90 tablet 1   Ferrous Fumarate (HEMOCYTE - 106 MG FE) 324 (106 Fe) MG TABS tablet Take 1 tablet by mouth daily.     Ferrous Fumarate-DSS (FE CAPS/STOOL SOFTENER) 50-100 MG TABS Take 50-100 mg by mouth daily. 30 tablet 3   furosemide  (LASIX ) 40 MG tablet TAKE 1 TABLET EVERY DAY 90 tablet 1   hydrochlorothiazide  (MICROZIDE ) 12.5 MG capsule TAKE 1 CAPSULE EVERY DAY 90 capsule 0   Insulin  NPH, Human,, Isophane, (NOVOLIN  N FLEXPEN) 100 UNIT/ML Kiwkpen INJECT 40 UNITS UNDER THE SKIN IN THE MORNING AND 30 TO 30 UNITS AT BEDTIME 75 mL 3   levothyroxine  (SYNTHROID ) 50 MCG tablet TAKE 1 TABLET EVERY DAY BEFORE BREAKFAST 90 tablet 3   metFORMIN  (GLUCOPHAGE ) 1000 MG tablet TAKE 1 TABLET TWICE DAILY 180 tablet 3   metoCLOPramide  (REGLAN ) 10 MG tablet Take 1 tablet (10 mg total) by mouth every 6 (six) hours as needed. 30 tablet 0   nystatin  (MYCOSTATIN /NYSTOP ) powder Apply 1 Application topically daily as needed (yeast under belly). 60 g 2   pantoprazole  (PROTONIX ) 40 MG tablet TAKE 1 TABLET TWICE DAILY 180 tablet 3   potassium chloride  (KLOR-CON  M) 10 MEQ tablet Take 1 tablet (10 mEq total) by mouth daily. 90 tablet 2   rosuvastatin  (CRESTOR ) 20 MG tablet Take 1 tablet (20 mg total) by mouth at bedtime. 90 tablet 1   sucralfate  (CARAFATE ) 1 g tablet Take 1 tablet (1 g total) by mouth 4 (four) times daily. 120 tablet 1   Tiotropium Bromide -Olodaterol (STIOLTO RESPIMAT ) 2.5-2.5 MCG/ACT AERS Inhale 2 puffs into the lungs daily. 4 g 11   TRUE METRIX BLOOD GLUCOSE TEST test strip Use 2x a day 200 each 3   valsartan  (DIOVAN ) 80 MG tablet TAKE 1 TABLET (80 MG TOTAL) BY MOUTH DAILY. 90 tablet 3   No facility-administered medications prior to visit.     Review of Systems:   Constitutional:   No  weight loss, night sweats,  Fevers, chills,  or  lassitude.  +fatigue  HEENT:   No headaches,  Difficulty swallowing,  Tooth/dental problems, or  Sore throat,                No sneezing, itching, ear ache, nasal  congestion, post nasal drip,   CV:  No chest pain,  Orthopnea, PND, swelling in lower extremities, anasarca, dizziness, palpitations, syncope.   GI  No heartburn, indigestion, abdominal pain, nausea, vomiting, diarrhea, change in bowel habits, loss of appetite, bloody stools.   Resp: Positive for productive cough at baseline, now with pink sputum.  Reports chest wall discomfort along the ribs on both the right and left; worse with coughing.  No chest wall deformity  Skin: no rash or lesions.  GU: no dysuria, change in color of urine, no urgency or frequency.  No flank  pain, no hematuria   MS:  No joint pain or swelling.  No decreased range of motion.  No back pain.    Physical Exam  BP (!) 137/58 (BP Location: Left Arm, Patient Position: Sitting, Cuff Size: Normal)   Pulse 96   Temp 98.1 F (36.7 C)   Ht 5' 6 (1.676 m)   Wt 224 lb (101.6 kg)   SpO2 95%   BMI 36.15 kg/m   GEN: A/Ox3; appears tired, well nourished    HEENT:  [place]/AT,  EACs-clear, TMs-wnl, NOSE-clear, THROAT-clear, no lesions, no postnasal drip or exudate noted.   NECK:  Supple w/ fair ROM; no JVD; normal carotid impulses w/o bruits; no thyromegaly or nodules palpated; no lymphadenopathy.    RESP  LLL and LML with expiratory wheezes and few crackles; otherwise fairly good aeration throughout both lungs anteriorly and posteriorly. no accessory muscle use, no dullness to percussion  CARD:  RRR, no m/r/g, trace peripheral edema b/l, pulses intact, no cyanosis or clubbing.  Chest wall discomfort elicited with palpation of the ribs bilaterally  GI:   Soft & nt; nml bowel sounds; no organomegaly or masses detected.   Musco: Warm bil, no deformities or joint swelling noted. Trace peripheral edema bilaterally  Neuro: alert, no focal deficits noted.    Skin: Warm, no lesions or rashes    Lab Results:  CBC    Component Value Date/Time   WBC 11.9 (H) 04/11/2023 1715   RBC 4.03 04/11/2023 1715   HGB 10.2 (L)  04/11/2023 1715   HCT 31.7 (L) 04/11/2023 1715   PLT 216 04/11/2023 1715   MCV 78.7 (L) 04/11/2023 1715   MCH 25.3 (L) 04/11/2023 1715   MCHC 32.2 04/11/2023 1715   RDW 15.9 (H) 04/11/2023 1715   LYMPHSABS 1,128 10/27/2022 1106   MONOABS 0.2 09/23/2022 1159   EOSABS 281 10/27/2022 1106   BASOSABS 50 10/27/2022 1106    BMET    Component Value Date/Time   NA 135 04/11/2023 1715   NA 140 05/27/2022 1151   K 4.6 04/11/2023 1715   CL 101 04/11/2023 1715   CO2 20 (L) 04/11/2023 1715   GLUCOSE 167 (H) 04/11/2023 1715   BUN 26 (H) 04/11/2023 1715   BUN 17 05/27/2022 1151   CREATININE 1.28 (H) 04/11/2023 1715   CREATININE 1.02 (H) 10/27/2022 1106   CALCIUM  10.4 (H) 04/11/2023 1715   GFRNONAA 43 (L) 04/11/2023 1715   GFRNONAA 63 11/15/2019 1523   GFRAA 73 11/15/2019 1523    BNP    Component Value Date/Time   BNP 33.7 09/29/2022 0621   BNP 16 10/21/2021 1431    ProBNP No results found for: PROBNP  Imaging: No results found.  Administration History     None          Latest Ref Rng & Units 08/18/2021    9:55 AM  PFT Results  FVC-Pre L 2.27   FVC-Predicted Pre % 74   FVC-Post L 2.28   FVC-Predicted Post % 75   Pre FEV1/FVC % % 84   Post FEV1/FCV % % 85   FEV1-Pre L 1.91   FEV1-Predicted Pre % 83   FEV1-Post L 1.94   DLCO uncorrected ml/min/mmHg 14.64   DLCO UNC% % 72   DLCO corrected ml/min/mmHg 16.16   DLCO COR %Predicted % 80   DLVA Predicted % 102   TLC L 4.19   TLC % Predicted % 79   RV % Predicted % 78  No results found for: NITRICOXIDE      Assessment & Plan:   Encounter Diagnoses  Name Primary?   Abnormal CT of the chest Yes   Lung mass    Cough with hemoptysis    Chronic obstructive pulmonary disease, unspecified COPD type (HCC)      Cough with pink sputum production: -  Likely due to inflammation from coughing, however, cannot r/o a recurrent pneumonia -  Check Chest XR; intervention pending results.   -  Hycodan Q6hprn for  pain/cough management  Abnormal PET CT -  Bronchoscopy as scheduled to further evaluate  COPD: -  Continue Stiolto -  Continue Albuterol  nebs as needed    Candis Dandy, PA-C 07/27/2023

## 2023-07-28 ENCOUNTER — Other Ambulatory Visit: Payer: Self-pay

## 2023-07-28 ENCOUNTER — Other Ambulatory Visit: Payer: Self-pay | Admitting: Emergency Medicine

## 2023-07-28 ENCOUNTER — Encounter (HOSPITAL_COMMUNITY): Payer: Self-pay | Admitting: Emergency Medicine

## 2023-07-28 ENCOUNTER — Telehealth (HOSPITAL_BASED_OUTPATIENT_CLINIC_OR_DEPARTMENT_OTHER): Payer: Self-pay

## 2023-07-28 NOTE — Telephone Encounter (Signed)
 Copied from CRM [id number]. Topic: Clinical - Lab/Test Results >> Jul 28, 2023  2:41 PM Rilla B wrote: Reason for CRM: Patient stating she had Xrays on yesterday and want to know her results.  Please call.    ----------------------------------------------------------------------- From previous Reason for Contact - Other: Reason for CRM:

## 2023-07-28 NOTE — Progress Notes (Signed)
 SDW CALL  Patient was given pre-op instructions over the phone. The opportunity was given for the patient to ask questions. No further questions asked. Patient verbalized understanding of instructions given.   PCP - Almarie Bernardo HAS Cardiologist - LOV with cardiology 11/15/22 with Aleene Alveta COME. Pt to see Dr. Anner in one year.  Endocrinology - Tawni Vianne COME  PPM/ICD - denies Device Orders -  Rep Notified -   Chest x-ray - 07/27/23 EKG - 04/11/23 Stress Test - denies ECHO - 11/27/21 Cardiac Cath - denies  Sleep Study - Pt states she had one many years ago and was given a CPAP but quit wearing it because it gave her a sinus infection.  CPAP - no  Fasting Blood Sugar - pt states it ranges from 60's to 150's. I never know what it's going to be Checks Blood Sugar every am   Blood Thinner Instructions:na Aspirin  Instructions: per Dr. Lanny instructions,hold aspirin  2 days prior to procedure. Your last dose will be 07/29/23.  ERAS Protcol -no PRE-SURGERY Ensure or G2-   COVID TEST- na   Anesthesia review: yes- pt seen at [facility] pulmonary on 07/27/23 for productive cough and left lower chest discomfort.No fever. Pt has been using home O2L in the last 2-3 days due to SOB.   Patient denies shortness of breath, fever, cough and chest pain over the phone call   WHAT DO I DO ABOUT MY DIABETES MEDICATION?  Do not take oral diabetes medicines (pills) the morning of surgery. Do not take Metformin (glucophage ) the morning of surgery.   THE NIGHT BEFORE SURGERY, take 15 units of NPH (Novolin  N Flexpen) insulin .     THE MORNING OF SURGERY, take 20 units of NPH (Novolin  N Flexpen)insulin .  Check your blood sugar the morning of your surgery when you wake up and every 2 hours until you get to the Short Stay unit.  If your blood sugar is less than 70 mg/dL, you will need to treat for low blood sugar: Do not take insulin . Treat a low blood sugar (less than 70 mg/dL) with   cup of clear juice (cranberry or apple), 4 glucose tablets, OR glucose gel. Recheck blood sugar in 15 minutes after treatment (to make sure it is greater than 70 mg/dL). If your blood sugar is not greater than 70 mg/dL on recheck, call [phone] for further instructions. Report your blood sugar to the short stay nurse when you get to Short Stay.     As of today, STOP taking any Aspirin  (unless otherwise instructed by your surgeon) Aleve, Naproxen, Ibuprofen, Motrin, Advil, Goody's, BC's, all herbal medications, fish oil, and all vitamins.    Special instructions:    Oral Hygiene is also important to reduce your risk of infection.  Remember - BRUSH YOUR TEETH THE MORNING OF SURGERY WITH YOUR REGULAR TOOTHPASTE

## 2023-07-29 NOTE — Progress Notes (Signed)
 Anesthesia Chart Review: SAME DAY WORK-UP  Case: [id number] Date/Time: 08/01/23 1300   Procedure: BRONCHOSCOPY, WITH BIOPSY USING ELECTROMAGNETIC NAVIGATION (Left)   Anesthesia type: General   Diagnosis: Abnormal CT of the chest [R93.89]   Pre-op diagnosis: Left lower lobe opacity   [facility]   Surgeons: Shelah Lamar RAMAN, MD       DISCUSSION: Patient is a 78 year old female scheduled for the above procedure. Per 06/29/23 Progress Note by Dr. Shelah, Left lower lobe opacity that has been consistent with rounded atelectasis both in appearance and on prior bronchoscopy [05/19/20]. Now with some increased opacity a bit more superior. The PET scan shows this area to be moderately hypermetabolic. Etiology remains unclear, question persistent smoldering infection. Discussed with her today that we cannot rule out malignancy even though her prior bronchoscopy was reassuring. We will plan for repeat bronchoscopy to sample this area.  History includes former smoker (quit 2006), HTN, HLD, DM2, hypothyroidism, coronary calcifications (CAC = 1198, 95th percentile), COPD, OSA (does not use CPAP due increased sinusitis), panic attacks, dyspnea (post-COVID cough).   Last visit with cardiologist Dr. Alveta was on 11/15/22.  She was initially seen by Dr. Powell Sorrow in 10/2021 after she was referred by her PCP for triple vessel coronary Ca and LE edema. TTE 11/2021 showed LVEF 65-70%, G1DD, normal RV, no significant valve disease. Coronary CTA with extensive plaque but negative FFR in LAD and Lcx. FFR in distal PDA was 0.79. Ca score 1198. Was recommended for continued aggressive medical therapy. At last visit Dr. Alveta felt her to be very stable  from cardiac standpoint. She had known chronic shortness of breath related to COPD.  LDL 65.  Continue current medications with plan for 1 year follow-up.  A1c 7.7% on 05/19/23.  She is on metformin  1000 mg twice daily, Novolin   NPH 40 units in AM and 30 units at bedtime.  Anesthesia team to evaluate on the day of surgery.  Last aspirin  planned for 07/29/2023 per instructions by Dr. Shelah.  VS:  Wt Readings from Last 3 Encounters:  07/27/23 101.6 kg  06/29/23 101.9 kg  06/01/23 102.4 kg   BP Readings from Last 3 Encounters:  07/27/23 (!) 137/58  06/29/23 126/64  06/01/23 127/78   Pulse Readings from Last 3 Encounters:  07/27/23 96  06/29/23 99  06/01/23 (!) 104     PROVIDERS: Bernardo Fend, DO is PCP  Cardiologist - LOV with cardiology 11/15/22 with Aleene Alveta COME. Pt to see Dr. Anner in one year.  Shelah Lamar, MD is pulmonologist Vianne File, MD is endocrinologist Federico Offer, MD is GI   LABS: Most recent labs in [facility] include: Lab Results  Component Value Date   WBC 11.9 (H) 04/11/2023   HGB 10.2 (L) 04/11/2023   HCT 31.7 (L) 04/11/2023   PLT 216 04/11/2023   GLUCOSE 167 (H) 04/11/2023   CHOL 149 05/19/2023   TRIG 216 (H) 05/19/2023   HDL 47 (L) 05/19/2023   LDLDIRECT 64 03/15/2022   LDLCALC 71 05/19/2023   ALT 10 10/27/2022   AST 13 10/27/2022   NA 135 04/11/2023   K 4.6 04/11/2023   CL 101 04/11/2023   CREATININE 1.28 (H) 04/11/2023   BUN 26 (H) 04/11/2023   CO2 20 (L) 04/11/2023   TSH 3.33 10/27/2022   INR 1.2 09/23/2022   HGBA1C 7.7 (A) 05/19/2023   MICROALBUR 1.1 12/23/2022    IMAGES: CXR 07/27/23: In process.  PET Scan 06/14/23:  IMPRESSION: 1. The chronic airspace filling process involving much of the left lower lobe is mildly increased in size and mildly increased in metabolic activity. The upper component is hypermetabolic maximum SUV 6.0 (formerly 4.5) and the lower component is less hypermetabolic with maximum SUV of 3.4 (formerly 3.3). This is nonspecific and could be infectious/inflammatory or neoplastic. 2. Other patchy airspace opacities are present in both lungs and associated with some mildly accentuated metabolic activity, but that activity  is not as focal as the left lower lobe activity. 3. Cirrhosis. 4. Sigmoid colon diverticulosis. 5.  Aortic Atherosclerosis (ICD10-I70.0).   CT Chest Super D 05/20/23: IMPRESSION: 1. Large predominantly solid indistinct left lower lobe lung mass with internal ground-glass and cystic areas, measuring 7.7 x 5.9 x 11.8 cm, not appreciably changed since 01/14/2023 CT. Differential includes indolent primary bronchogenic adenocarcinoma versus organizing pneumonia, as previously described. 2. Stable mildly enlarged AP window node. No new thoracic adenopathy. 3. 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 4. Three-vessel coronary atherosclerosis. 5. Cirrhosis. Stable small varices anterior to the left liver. 6. Stable right adrenal 1.5 cm nodule, presumably an adenoma. 7.  Aortic Atherosclerosis (ICD10-I70.0).   EKG: EKG 04/11/2023: Sinus tach at 108 bpm  CV: CTA Coronary with FFRct 01/25/22: IMPRESSION: 1. Coronary artery calcium  score 1198 Agatston units. This places the patient in the 95th percentile for age and gender. 2. Extensive plaque but nonobstructive disease by FFR in the LAD and LCx systems. 3. FFR 0.79 in the distal PDA. This is of borderline significance. There is no discrete severe stenosis in the RCA system. There is diffuse plaque throughout with resultant borderline FFR decrease in the distal PDA.    TTE 11/27/21: IMPRESSIONS   1. Mild intracavitary gradient. Peak velocity 0.95 m/s. Mean gradient 3.6  mmHg. Left ventricular ejection fraction, by estimation, is 65 to 70%. The  left ventricle has normal function. The left ventricle has no regional  wall motion abnormalities. There  is mild concentric left ventricular hypertrophy. Left ventricular  diastolic parameters are consistent with Grade I diastolic dysfunction   (impaired relaxation). The average left ventricular global longitudinal  strain is -19.0 %. The global longitudinal  strain is normal.   2. Right ventricular systolic function is normal. The right ventricular  size is normal.   3. The mitral valve is normal in structure. No evidence of mitral valve  regurgitation. No evidence of mitral stenosis.   4. The aortic valve is normal in structure. Aortic valve regurgitation is  not visualized. No aortic stenosis is present.   5. The inferior vena cava is normal in size with greater than 50%  respiratory variability, suggesting right atrial pressure of 3 mmHg.   Past Medical History:  Diagnosis Date   Arthritis    knee and shoulders   Colon polyps 09/05/2017   COPD (chronic obstructive pulmonary disease) (HCC)    Diabetes mellitus without complication (HCC)    type II   Dyspnea    05/16/20 has had a cough for 2.5 years post Covid   Gastritis 09/05/2017   GERD (gastroesophageal reflux disease)    Hyperlipidemia    Hypertension    patient denies   Hypothyroidism    Panic attack    Pneumonia    RUQ pain 03/09/2017   Per patient, she has had RUQ pain 4-5 years that has grown worse in the last few months.  Past Surgical History:  Procedure Laterality Date   BREAST BIOPSY Left 2008   CORE W/CLIP - NEG   BRONCHIAL BIOPSY  05/19/2020   Procedure: BRONCHIAL BIOPSIES;  Surgeon: Shelah Lamar RAMAN, MD;  Location: [facility] ENDOSCOPY;  Service: Pulmonary;;   BRONCHIAL BRUSHINGS  05/19/2020   Procedure: BRONCHIAL BRUSHINGS;  Surgeon: Shelah Lamar RAMAN, MD;  Location: [facility] ENDOSCOPY;  Service: Pulmonary;;   BRONCHIAL NEEDLE ASPIRATION BIOPSY  05/19/2020   Procedure: BRONCHIAL NEEDLE ASPIRATION BIOPSIES;  Surgeon: Shelah Lamar RAMAN, MD;  Location: [facility] ENDOSCOPY;  Service: Pulmonary;;   BRONCHIAL WASHINGS  05/19/2020   Procedure: BRONCHIAL WASHINGS;  Surgeon: Shelah Lamar RAMAN, MD;  Location: [facility] ENDOSCOPY;  Service: Pulmonary;;   COLONOSCOPY WITH PROPOFOL  N/A  02/08/2017   Procedure: COLONOSCOPY WITH PROPOFOL ;  Surgeon: Gaylyn Gladis PENNER, MD;  Location: [facility] ENDOSCOPY;  Service: Endoscopy;  Laterality: N/A;   COLONOSCOPY WITH PROPOFOL  N/A 05/05/2022   Procedure: COLONOSCOPY WITH PROPOFOL ;  Surgeon: Unk Corinn Skiff, MD;  Location: [facility] ENDOSCOPY;  Service: Gastroenterology;  Laterality: N/A;   ESOPHAGOGASTRODUODENOSCOPY (EGD) WITH PROPOFOL  N/A 02/08/2017   Procedure: ESOPHAGOGASTRODUODENOSCOPY (EGD) WITH PROPOFOL ;  Surgeon: Gaylyn Gladis PENNER, MD;  Location: [facility] ENDOSCOPY;  Service: Endoscopy;  Laterality: N/A;   TUBAL LIGATION     VIDEO BRONCHOSCOPY WITH ENDOBRONCHIAL NAVIGATION N/A 05/19/2020   Procedure: VIDEO BRONCHOSCOPY WITH ENDOBRONCHIAL NAVIGATION;  Surgeon: Shelah Lamar RAMAN, MD;  Location: [facility] ENDOSCOPY;  Service: Pulmonary;  Laterality: N/A;    MEDICATIONS: No current facility-administered medications for this encounter.    Accu-Chek Softclix Lancets lancets   albuterol  (PROVENTIL ) (2.5 MG/3ML) 0.083% nebulizer solution   albuterol  (VENTOLIN  HFA) 108 (90 Base) MCG/ACT inhaler   Alcohol Swabs (DROPSAFE ALCOHOL PREP) 70 % PADS   Ascorbic Acid  (VITAMIN C ) 1000 MG tablet   aspirin  EC 81 MG tablet   benzonatate  (TESSALON ) 200 MG capsule   Blood Glucose Monitoring Suppl (TRUE METRIX METER) w/Device KIT   Cholecalciferol  (VITAMIN D ) 50 MCG (2000 UT) CAPS   DROPLET PEN NEEDLES 32G X 4 MM MISC   ezetimibe  (ZETIA ) 10 MG tablet   Ferrous Fumarate (HEMOCYTE - 106 MG FE) 324 (106 Fe) MG TABS tablet   Ferrous Fumarate-DSS (FE CAPS/STOOL SOFTENER) 50-100 MG TABS   furosemide  (LASIX ) 40 MG tablet   hydrochlorothiazide  (MICROZIDE ) 12.5 MG capsule   HYDROcodone  bit-homatropine (HYCODAN) 5-1.5 MG/5ML syrup   Insulin  NPH, Human,, Isophane, (NOVOLIN  N FLEXPEN) 100 UNIT/ML Kiwkpen   levothyroxine  (SYNTHROID ) 50 MCG tablet   metFORMIN  (GLUCOPHAGE ) 1000 MG tablet   metoCLOPramide  (REGLAN ) 10 MG tablet   nystatin  (MYCOSTATIN /NYSTOP ) powder   pantoprazole   (PROTONIX ) 40 MG tablet   potassium chloride  (KLOR-CON  M) 10 MEQ tablet   rosuvastatin  (CRESTOR ) 20 MG tablet   sucralfate  (CARAFATE ) 1 g tablet   Tiotropium Bromide -Olodaterol (STIOLTO RESPIMAT ) 2.5-2.5 MCG/ACT AERS   TRUE METRIX BLOOD GLUCOSE TEST test strip   valsartan  (DIOVAN ) 80 MG tablet    Isaiah Ruder, PA-C Surgical Short Stay/Anesthesiology [facility] Phone [phone] [facility] Phone [phone] 07/29/2023 11:55 AM

## 2023-07-29 NOTE — Anesthesia Preprocedure Evaluation (Addendum)
 Anesthesia Evaluation  Patient identified by MRN, date of birth, ID band Patient awake    Reviewed: Allergy & Precautions, NPO status , Patient's Chart, lab work & pertinent test results  History of Anesthesia Complications Negative for: history of anesthetic complications  Airway Mallampati: III  TM Distance: >3 FB Neck ROM: Limited   Comment: Previous grade I view with MAC 3, easy mask with OPA Dental  (+) Upper Dentures, Lower Dentures, Dental Advisory Given   Pulmonary shortness of breath, sleep apnea , COPD (uses 2 LPM at night and during the day PRN),  COPD inhaler and oxygen dependent, neg recent URI, former smoker LLL opacity   Pulmonary exam normal breath sounds clear to auscultation       Cardiovascular hypertension (HCTZ, valsartan ), Pt. on medications (-) angina + CAD  (-) Past MI, (-) Cardiac Stents and (-) CABG (-) dysrhythmias  Rhythm:Regular Rate:Normal  HLD  CTA Coronary with FFRct 01/25/22: IMPRESSION: 1. Coronary artery calcium  score 1198 Agatston units. This places the patient in the 95th percentile for age and gender. 2. Extensive plaque but nonobstructive disease by FFR in the LAD and LCx systems. 3. FFR 0.79 in the distal PDA. This is of borderline significance. There is no discrete severe stenosis in the RCA system. There is diffuse plaque throughout with resultant borderline FFR decrease in the distal PDA.    TTE 11/27/21: IMPRESSIONS   1. Mild intracavitary gradient. Peak velocity 0.95 m/s. Mean gradient 3.6  mmHg. Left ventricular ejection fraction, by estimation, is 65 to 70%. The  left ventricle has normal function. The left ventricle has no regional  wall motion abnormalities. There  is mild concentric left ventricular hypertrophy. Left ventricular  diastolic parameters are consistent with Grade I diastolic dysfunction  (impaired relaxation). The average left ventricular global longitudinal   strain is -19.0 %. The global longitudinal  strain is normal.   2. Right ventricular systolic function is normal. The right ventricular  size is normal.   3. The mitral valve is normal in structure. No evidence of mitral valve  regurgitation. No evidence of mitral stenosis.   4. The aortic valve is normal in structure. Aortic valve regurgitation is  not visualized. No aortic stenosis is present.   5. The inferior vena cava is normal in size with greater than 50%  respiratory variability, suggesting right atrial pressure of 3 mmHg.     Neuro/Psych neg Seizures PSYCHIATRIC DISORDERS (panic attacks) Anxiety      Neuromuscular disease (cervical radiculopathy)    GI/Hepatic ,GERD  Medicated,,(+) Cirrhosis  (patient unaware)        Endo/Other  diabetes (Hgb A1c 7.7), Poorly Controlled, Type 2, Insulin  Dependent, Oral Hypoglycemic AgentsHypothyroidism    Renal/GU negative Renal ROS     Musculoskeletal  (+) Arthritis ,    Abdominal  (+) + obese  Peds  Hematology  (+) Blood dyscrasia, anemia Lab Results      Component                Value               Date                      WBC                      11.9 (H)            04/11/2023  HGB                      10.2 (L)            04/11/2023                HCT                      31.7 (L)            04/11/2023                MCV                      78.7 (L)            04/11/2023                PLT                      216                 04/11/2023              Anesthesia Other Findings   Reproductive/Obstetrics                              Anesthesia Physical Anesthesia Plan  ASA: 3  Anesthesia Plan: General   Post-op Pain Management: Tylenol  PO (pre-op)*   Induction: Intravenous  PONV Risk Score and Plan: 3 and Ondansetron , Dexamethasone , Propofol  infusion, TIVA and Treatment may vary due to age or medical condition  Airway Management Planned: Oral ETT  Additional Equipment:    Intra-op Plan:   Post-operative Plan: Extubation in OR  Informed Consent: I have reviewed the patients History and Physical, chart, labs and discussed the procedure including the risks, benefits and alternatives for the proposed anesthesia with the patient or authorized representative who has indicated his/her understanding and acceptance.     Dental advisory given  Plan Discussed with: CRNA and Anesthesiologist  Anesthesia Plan Comments: (PAT note written 07/29/2023 by Kylar Leonhardt, PA-C.  Risks of general anesthesia discussed including, but not limited to, sore throat, hoarse voice, chipped/damaged teeth, injury to vocal cords, nausea and vomiting, allergic reactions, lung infection, heart attack, stroke, and death. All questions answered.   )         Anesthesia Quick Evaluation

## 2023-08-01 ENCOUNTER — Ambulatory Visit (HOSPITAL_COMMUNITY)

## 2023-08-01 ENCOUNTER — Ambulatory Visit (HOSPITAL_BASED_OUTPATIENT_CLINIC_OR_DEPARTMENT_OTHER): Payer: Self-pay | Admitting: Vascular Surgery

## 2023-08-01 ENCOUNTER — Encounter (HOSPITAL_COMMUNITY)
Admission: RE | Disposition: A | Discharge status: Home / Self Care | Payer: Self-pay | Source: Home / Self Care | Attending: Emergency Medicine

## 2023-08-01 ENCOUNTER — Ambulatory Visit (HOSPITAL_COMMUNITY)
Admission: RE | Admit: 2023-08-01 | Discharge: 2023-08-01 | Disposition: A | Discharge status: Home / Self Care | Attending: Emergency Medicine | Admitting: Emergency Medicine

## 2023-08-01 ENCOUNTER — Ambulatory Visit (HOSPITAL_COMMUNITY): Payer: Self-pay | Admitting: Vascular Surgery

## 2023-08-01 ENCOUNTER — Other Ambulatory Visit: Payer: Self-pay

## 2023-08-01 ENCOUNTER — Ambulatory Visit: Payer: Self-pay

## 2023-08-01 ENCOUNTER — Encounter (HOSPITAL_COMMUNITY): Payer: Self-pay | Admitting: Emergency Medicine

## 2023-08-01 DIAGNOSIS — I2584 Coronary atherosclerosis due to calcified coronary lesion: Secondary | ICD-10-CM

## 2023-08-01 DIAGNOSIS — R918 Other nonspecific abnormal finding of lung field: Secondary | ICD-10-CM | POA: Diagnosis not present

## 2023-08-01 DIAGNOSIS — E119 Type 2 diabetes mellitus without complications: Secondary | ICD-10-CM | POA: Diagnosis not present

## 2023-08-01 DIAGNOSIS — I2510 Atherosclerotic heart disease of native coronary artery without angina pectoris: Secondary | ICD-10-CM | POA: Diagnosis not present

## 2023-08-01 DIAGNOSIS — Z87891 Personal history of nicotine dependence: Secondary | ICD-10-CM | POA: Insufficient documentation

## 2023-08-01 DIAGNOSIS — R9389 Abnormal findings on diagnostic imaging of other specified body structures: Secondary | ICD-10-CM | POA: Diagnosis not present

## 2023-08-01 DIAGNOSIS — I10 Essential (primary) hypertension: Secondary | ICD-10-CM | POA: Insufficient documentation

## 2023-08-01 DIAGNOSIS — J449 Chronic obstructive pulmonary disease, unspecified: Secondary | ICD-10-CM | POA: Insufficient documentation

## 2023-08-01 DIAGNOSIS — G4730 Sleep apnea, unspecified: Secondary | ICD-10-CM | POA: Insufficient documentation

## 2023-08-01 DIAGNOSIS — Z794 Long term (current) use of insulin: Secondary | ICD-10-CM | POA: Diagnosis not present

## 2023-08-01 DIAGNOSIS — Z9981 Dependence on supplemental oxygen: Secondary | ICD-10-CM | POA: Insufficient documentation

## 2023-08-01 DIAGNOSIS — C3432 Malignant neoplasm of lower lobe, left bronchus or lung: Secondary | ICD-10-CM | POA: Insufficient documentation

## 2023-08-01 HISTORY — PX: BRONCHIAL BIOPSY: SHX5109

## 2023-08-01 HISTORY — PX: BRONCHOSCOPY, WITH BIOPSY USING ELECTROMAGNETIC NAVIGATION: SHX7536

## 2023-08-01 HISTORY — DX: Pneumonia, unspecified organism: J18.9

## 2023-08-01 HISTORY — PX: BRONCHIAL BRUSHINGS: SHX5108

## 2023-08-01 HISTORY — DX: Anemia, unspecified: D64.9

## 2023-08-01 HISTORY — PX: BRONCHIAL NEEDLE ASPIRATION BIOPSY: SHX5106

## 2023-08-01 HISTORY — PX: BRONCHIAL WASHINGS: SHX5105

## 2023-08-01 LAB — GLUCOSE, CAPILLARY
Glucose-Capillary: 205 mg/dL — ABNORMAL HIGH (ref 70–99)
Glucose-Capillary: 158 mg/dL — ABNORMAL HIGH (ref 70–99)

## 2023-08-01 LAB — BASIC METABOLIC PANEL WITH GFR
Anion gap: 12 (ref 5–15)
BUN: 8 mg/dL (ref 8–23)
CO2: 21 mmol/L — ABNORMAL LOW (ref 22–32)
Calcium: 9.7 mg/dL (ref 8.9–10.3)
Chloride: 100 mmol/L (ref 98–111)
Creatinine, Ser: 1.07 mg/dL — ABNORMAL HIGH (ref 0.44–1.00)
GFR, Estimated: 54 mL/min — ABNORMAL LOW (ref >60)
Glucose, Bld: 185 mg/dL — ABNORMAL HIGH (ref 70–99)
Potassium: 3.9 mmol/L (ref 3.5–5.1)
Sodium: 133 mmol/L — ABNORMAL LOW (ref 135–145)

## 2023-08-01 LAB — CBC
HCT: 28.3 % — ABNORMAL LOW (ref 36.0–46.0)
Hemoglobin: 8.7 g/dL — ABNORMAL LOW (ref 12.0–15.0)
MCH: 24.7 pg — ABNORMAL LOW (ref 26.0–34.0)
MCHC: 30.7 g/dL (ref 30.0–36.0)
MCV: 80.4 fL (ref 80.0–100.0)
Platelets: 177 K/uL (ref 150–400)
RBC: 3.52 MIL/uL — ABNORMAL LOW (ref 3.87–5.11)
RDW: 14.9 % (ref 11.5–15.5)
WBC: 6.7 K/uL (ref 4.0–10.5)
nRBC: 0 % (ref 0.0–0.2)

## 2023-08-01 SURGERY — BRONCHOSCOPY, WITH BIOPSY USING ELECTROMAGNETIC NAVIGATION
Anesthesia: General | Laterality: Left

## 2023-08-01 MED ORDER — PROPOFOL 10 MG/ML IV BOLUS
INTRAVENOUS | Status: DC | PRN
  Administered 2023-08-01: 150 ug/kg/min via INTRAVENOUS
  Administered 2023-08-01: 160 mg via INTRAVENOUS

## 2023-08-01 MED ORDER — DEXAMETHASONE SODIUM PHOSPHATE 10 MG/ML IJ SOLN
INTRAMUSCULAR | Status: DC | PRN
  Administered 2023-08-01: 5 mg via INTRAVENOUS

## 2023-08-01 MED ORDER — FENTANYL CITRATE (PF) 100 MCG/2ML IJ SOLN
INTRAMUSCULAR | Status: AC
  Filled 2023-08-01: qty 2

## 2023-08-01 MED ORDER — SUCCINYLCHOLINE CHLORIDE 200 MG/10ML IV SOSY
PREFILLED_SYRINGE | INTRAVENOUS | Status: DC | PRN
  Administered 2023-08-01: 200 mg via INTRAVENOUS

## 2023-08-01 MED ORDER — LIDOCAINE HCL (CARDIAC) PF 50 MG/5ML IV SOSY
PREFILLED_SYRINGE | INTRAVENOUS | Status: DC | PRN
  Administered 2023-08-01: 100 mg via INTRAVENOUS

## 2023-08-01 MED ORDER — LACTATED RINGERS IV SOLN: INTRAVENOUS | Status: DC

## 2023-08-01 MED ORDER — OXYCODONE HCL 5 MG/5ML PO SOLN: 5.0000 mg | Freq: Once | ORAL | Status: DC | PRN

## 2023-08-01 MED ORDER — PHENYLEPHRINE HCL-NACL 20-0.9 MG/250ML-% IV SOLN
INTRAVENOUS | Status: DC | PRN
  Administered 2023-08-01: 30 ug/min via INTRAVENOUS

## 2023-08-01 MED ORDER — ONDANSETRON HCL 4 MG/2ML IJ SOLN
INTRAMUSCULAR | Status: DC | PRN
  Administered 2023-08-01: 4 mg via INTRAVENOUS

## 2023-08-01 MED ORDER — CHLORHEXIDINE GLUCONATE 0.12 % MT SOLN
15.0000 mL | Freq: Once | OROMUCOSAL | Status: AC
  Administered 2023-08-01: 15 mL via OROMUCOSAL
  Filled 2023-08-01: qty 15

## 2023-08-01 MED ORDER — INSULIN ASPART 100 UNIT/ML IJ SOLN
0.0000 [IU] | INTRAMUSCULAR | Status: DC | PRN
  Filled 2023-08-01: qty 1

## 2023-08-01 MED ORDER — FENTANYL CITRATE (PF) 100 MCG/2ML IJ SOLN
INTRAMUSCULAR | Status: DC | PRN
  Administered 2023-08-01: 50 ug via INTRAVENOUS

## 2023-08-01 MED ORDER — AMISULPRIDE (ANTIEMETIC) 5 MG/2ML IV SOLN: 10.0000 mg | Freq: Once | INTRAVENOUS | Status: DC | PRN

## 2023-08-01 MED ORDER — OXYCODONE HCL 5 MG PO TABS: 5.0000 mg | ORAL_TABLET | Freq: Once | ORAL | Status: DC | PRN

## 2023-08-01 MED ORDER — ROCURONIUM BROMIDE 10 MG/ML (PF) SYRINGE
PREFILLED_SYRINGE | INTRAVENOUS | Status: DC | PRN
  Administered 2023-08-01: 50 mg via INTRAVENOUS

## 2023-08-01 MED ORDER — FENTANYL CITRATE (PF) 100 MCG/2ML IJ SOLN: 25.0000 ug | INTRAMUSCULAR | Status: DC | PRN

## 2023-08-01 NOTE — Progress Notes (Signed)
 Dr. Peggye made aware of patient's CBC result. Hematocrit 8.7 and hemoglobin 28.3. No new orders received.

## 2023-08-01 NOTE — Discharge Instructions (Addendum)

## 2023-08-01 NOTE — Anesthesia Procedure Notes (Addendum)
 Procedure Name: Intubation Date/Time: 08/01/2023 12:50 PM  Performed by: Cindie Ronal POUR, CRNAPre-anesthesia Checklist: Patient identified, Emergency Drugs available, Suction available and Patient being monitored Patient Re-evaluated:Patient Re-evaluated prior to induction Oxygen Delivery Method: Circle System Utilized Preoxygenation: Pre-oxygenation with 100% oxygen Induction Type: IV induction Ventilation: Mask ventilation without difficulty Laryngoscope Size: Glidescope and 3 Grade View: Grade I Tube type: Oral Tube size: 8.0 mm Number of attempts: 1 Airway Equipment and Method: Stylet and Oral airway Placement Confirmation: ETT inserted through vocal cords under direct vision, positive ETCO2 and breath sounds checked- equal and bilateral Secured at: 21 cm Tube secured with: Tape Dental Injury: Teeth and Oropharynx as per pre-operative assessment

## 2023-08-01 NOTE — Op Note (Signed)
 Video Bronchoscopy with Robotic Assisted Bronchoscopic Navigation   Date of Operation: 08/01/2023   Pre-op Diagnosis: Left lower lobe opacity  Post-op Diagnosis: Same  Surgeon: Lamar Chris  Assistants: None  Anesthesia: General endotracheal anesthesia  Operation: Flexible video fiberoptic bronchoscopy with robotic assistance and biopsies.  Estimated Blood Loss: Minimal  Complications: None  Indications and History: Tamara [name] is a 78 y.o. female with history of COPD, recurrent pneumonias, chronic left lower lobe opacity.  This was negative for malignancy on biopsy in 2022.  Serial imaging has shown some interval change in the left lower lobe opacity with a more  superior component that has some mild hypermetabolism on PET scan.  Recommendation made to achieve a tissue diagnosis and to obtain culture data via robotic assisted navigational bronchoscopy.  The risks, benefits, complications, treatment options and expected outcomes were discussed with the patient.  The possibilities of pneumothorax, pneumonia, reaction to medication, pulmonary aspiration, perforation of a viscus, bleeding, failure to diagnose a condition and creating a complication requiring transfusion or operation were discussed with the patient who freely signed the consent.    Description of Procedure: The patient was seen in the Preoperative Area, was examined and was deemed appropriate to proceed.  The patient was taken to [facility] Endoscopy room 3, identified as [name] and the procedure verified as Flexible Video Fiberoptic Bronchoscopy.  A Time Out was held and the above information confirmed.   Prior to the date of the procedure a high-resolution CT scan of the chest was performed. Utilizing ION software program a virtual tracheobronchial tree was generated to allow the creation of distinct navigation pathways to the patient's parenchymal abnormalities. After being taken to the operating room general  anesthesia was initiated and the patient  was orally intubated. The video fiberoptic bronchoscope was introduced via the endotracheal tube and a general inspection was performed which showed normal right and left lung anatomy. Aspiration of the bilateral mainstems was completed to remove any remaining secretions. Robotic catheter inserted into patient's endotracheal tube.   Target #1 left lower lobe opacity: The distinct navigation pathways prepared prior to this procedure were then utilized to navigate to patient's lesion identified on CT scan. The robotic catheter was secured into place and the vision probe was withdrawn.  Lesion location was approximated using fluoroscopy.  Local registration and targeting was performed using [employer] Cios mobile C-arm three-dimensional imaging. Under fluoroscopic guidance transbronchial needle brushings, transbronchial needle biopsies, and transbronchial forceps biopsies were performed to be sent for cytology and pathology.  Needle-in-lesion was confirmed using Cios mobile C-arm.  A bronchioalveolar lavage was performed in the left lower lobe and in the left upper lobe and sent for microbiology.     At the end of the procedure a general airway inspection was performed and there was no evidence of active bleeding. The bronchoscope was removed.  The patient tolerated the procedure well. There was no significant blood loss and there were no obvious complications. A post-procedural chest x-ray is pending.  Samples Target #1: 1. Transbronchial needle brushings from left lower lobe opacity 2. Transbronchial Wang needle biopsies from left lower lobe opacity 3. Transbronchial forceps biopsies from left lower lobe opacity 4. Bronchoalveolar lavage from left lower lobe 5. Bronchoalveolar lavage from left upper lobe   Plans:  The patient will be discharged from the PACU to home when recovered from anesthesia and after chest x-ray is reviewed. We will review the  cytology, pathology and microbiology results with the patient  when they become available. Outpatient followup will be with Dr. Shelah CANDIE Lites, NP, and DOROTHA Dandy, PA.   Lamar Shelah, MD, PhD 08/01/2023, 1:49 PM [facility] [phone] or if no answer before 7:00PM call [phone] For any issues after 7:00PM please call eLink [phone]

## 2023-08-01 NOTE — Transfer of Care (Signed)
 Immediate Anesthesia Transfer of Care Note  Patient: Tamara [name]  Procedure(s) Performed: BRONCHOSCOPY, WITH BIOPSY USING ELECTROMAGNETIC NAVIGATION (Left) BRONCHOSCOPY, WITH NEEDLE ASPIRATION BIOPSY BRONCHOSCOPY, WITH BRUSH BIOPSY BRONCHOSCOPY, WITH BIOPSY IRRIGATION, BRONCHUS  Patient Location: PACU  Anesthesia Type:General  Level of Consciousness: awake and sedated  Airway & Oxygen Therapy: Patient Spontanous Breathing and Patient connected to face mask oxygen  Post-op Assessment: Report given to RN and Post -op Vital signs reviewed and stable  Post vital signs: Reviewed and stable  Last Vitals:  Vitals Value Taken Time  BP 112/96 08/01/23 13:53  Temp 36.8 C 08/01/23 13:53  Pulse 87 08/01/23 14:01  Resp 21 08/01/23 14:01  SpO2 92 % 08/01/23 14:01  Vitals shown include unfiled device data.  Last Pain:  Vitals:   08/01/23 1353  PainSc: 0-No pain      Patients Stated Pain Goal: 0 (08/01/23 1051)  Complications: No notable events documented.

## 2023-08-01 NOTE — Anesthesia Postprocedure Evaluation (Signed)
 Anesthesia Post Note  Patient: Tamara [name]  Procedure(s) Performed: BRONCHOSCOPY, WITH BIOPSY USING ELECTROMAGNETIC NAVIGATION (Left) BRONCHOSCOPY, WITH NEEDLE ASPIRATION BIOPSY BRONCHOSCOPY, WITH BRUSH BIOPSY BRONCHOSCOPY, WITH BIOPSY IRRIGATION, BRONCHUS     Patient location during evaluation: PACU Anesthesia Type: General Level of consciousness: awake Pain management: pain level controlled Vital Signs Assessment: post-procedure vital signs reviewed and stable Respiratory status: spontaneous breathing, nonlabored ventilation and respiratory function stable Cardiovascular status: blood pressure returned to baseline and stable Postop Assessment: no apparent nausea or vomiting Anesthetic complications: no   No notable events documented.  Last Vitals:  Vitals:   08/01/23 1415 08/01/23 1430  BP: 125/64 (!) 124/53  Pulse: 84 81  Resp: 13 17  Temp:  36.8 C  SpO2: 92% 91%    Last Pain:  Vitals:   08/01/23 1415  PainSc: 0-No pain                 Delon Aisha Arch

## 2023-08-01 NOTE — Progress Notes (Signed)
 Chest XR with previously seen left mid lung mass that is slightly less dense.  No pneumonia and no new changes.  Stable.

## 2023-08-01 NOTE — Interval H&P Note (Signed)
 History and Physical Interval Note:  08/01/2023 12:25 PM  Tamara [name]  has presented today for surgery, with the diagnosis of Left lower lobe opacity.  The various methods of treatment have been discussed with the patient and family. After consideration of risks, benefits and other options for treatment, the patient has consented to  Procedure(s): BRONCHOSCOPY, WITH BIOPSY USING ELECTROMAGNETIC NAVIGATION (Left) as a surgical intervention.  The patient's history has been reviewed, patient examined, no change in status, stable for surgery.  I have reviewed the patient's chart and labs.  Questions were answered to the patient's satisfaction.     Lamar GORMAN Chris

## 2023-08-02 LAB — CYTOLOGY - NON PAP

## 2023-08-03 ENCOUNTER — Other Ambulatory Visit: Payer: Self-pay | Admitting: Nurse Practitioner

## 2023-08-03 ENCOUNTER — Ambulatory Visit (INDEPENDENT_AMBULATORY_CARE_PROVIDER_SITE_OTHER): Admitting: Nurse Practitioner

## 2023-08-03 ENCOUNTER — Encounter: Payer: Self-pay | Admitting: Nurse Practitioner

## 2023-08-03 ENCOUNTER — Ambulatory Visit
Admission: RE | Admit: 2023-08-03 | Discharge: 2023-08-03 | Disposition: A | Discharge status: Home / Self Care | Attending: Nurse Practitioner | Admitting: Nurse Practitioner

## 2023-08-03 ENCOUNTER — Ambulatory Visit
Admission: RE | Admit: 2023-08-03 | Discharge: 2023-08-03 | Disposition: A | Discharge status: Home / Self Care | Source: Ambulatory Visit | Attending: Nurse Practitioner | Admitting: Nurse Practitioner

## 2023-08-03 ENCOUNTER — Ambulatory Visit: Payer: Self-pay

## 2023-08-03 VITALS — BP 116/58 | HR 82 | Resp 18 | Ht 66.0 in

## 2023-08-03 DIAGNOSIS — R918 Other nonspecific abnormal finding of lung field: Secondary | ICD-10-CM | POA: Diagnosis not present

## 2023-08-03 DIAGNOSIS — M546 Pain in thoracic spine: Secondary | ICD-10-CM | POA: Diagnosis not present

## 2023-08-03 DIAGNOSIS — R0602 Shortness of breath: Secondary | ICD-10-CM | POA: Diagnosis not present

## 2023-08-03 DIAGNOSIS — M545 Low back pain, unspecified: Secondary | ICD-10-CM

## 2023-08-03 DIAGNOSIS — Z48813 Encounter for surgical aftercare following surgery on the respiratory system: Secondary | ICD-10-CM | POA: Diagnosis not present

## 2023-08-03 LAB — ACID FAST SMEAR (AFB, MYCOBACTERIA)
Acid Fast Smear: NEGATIVE
Acid Fast Smear: NEGATIVE

## 2023-08-03 MED ORDER — TRAMADOL HCL 50 MG PO TABS
50.0000 mg | ORAL_TABLET | Freq: Three times a day (TID) | ORAL | 0 refills | Status: AC | PRN
Start: 2023-08-03 — End: 2023-08-08

## 2023-08-03 MED ORDER — BACLOFEN 5 MG PO TABS: 5.0000 mg | ORAL_TABLET | Freq: Three times a day (TID) | ORAL | 0 refills | Status: DC | PRN

## 2023-08-03 NOTE — Telephone Encounter (Signed)
 FYI Only or Action Required?: Action required by provider: clinical question for provider.  Patient is followed in Pulmonology for COPD, Pneumonia last seen on 06/29/2023 by Shelah Lamar RAMAN, MD.  Called Nurse Triage reporting No chief complaint on file..  Symptoms began yesterday.  Interventions attempted: Prescription medications: Robaxin  500 mg.  Symptoms are: gradually worsening.  Triage Disposition: See HCP Within 4 Hours (Or PCP Triage)  Patient/caregiver understands and will follow disposition?: Yes   Copied from CRM [id number]. Topic: Clinical - Red Word Triage >> Aug 03, 2023  8:13 AM Isabell A wrote: Red Word that prompted transfer to Nurse Triage: Patient is requesting to speak with Dr.Byrum's nurse - states she had a procedure on Monday (biopsy on lung) and she is in excruciating pain, experiencing back pain - feels like its broken, can barely walk, neck & shoulders are hurting.   Reason for Disposition  [1] SEVERE back pain (e.g., excruciating, unable to do any normal activities) AND [2] not improved 2 hours after pain medicine  Answer Assessment - Initial Assessment Questions 1. ONSET: When did the pain begin? (e.g., minutes, hours, days)     Yesterday  2. LOCATION: Where does it hurt? (upper, mid or lower back)     Shoulders, Neck, Back (Whole)   3. SEVERITY: How bad is the pain?  (e.g., Scale 1-10; mild, moderate, or severe)     Moderate to Severe  4. PATTERN: Is the pain constant? (e.g., yes, no; constant, intermittent)      Constant  5. RADIATION: Does the pain shoot into your legs or somewhere else?     No  6. CAUSE:  What do you think is causing the back pain?      Unsure  7. BACK OVERUSE:  Any recent lifting of heavy objects, strenuous work or exercise?     No recent overuse  8. MEDICINES: What have you taken so far for the pain? (e.g., nothing, acetaminophen , NSAIDS)     Muscle Relaxants: Robaxin  500 mg  9. NEUROLOGIC SYMPTOMS: Do  you have any weakness, numbness, or problems with bowel/bladder control?     Numbness and tingling in both legs  10. OTHER SYMPTOMS: Do you have any other symptoms? (e.g., fever, abdomen pain, burning with urination, blood in urine)        No  11. PREGNANCY: Is there any chance you are pregnant? When was your last menstrual period?       No and No   Please contact patient at [phone] regarding PFT tomorrow at 02:00, as the patient states she is not sure of what it is nor will she make it.  Protocols used: Back Pain-A-AH

## 2023-08-03 NOTE — Telephone Encounter (Signed)
 Please advise on rx for Robaxin  as pt Is post Bronch
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
## 2023-08-03 NOTE — Telephone Encounter (Signed)
 If she is having pain post-bronchoscopy, then she needs to be seen urgently. She needs to go to ED for immediate evaluation and CXR.

## 2023-08-04 ENCOUNTER — Encounter

## 2023-08-04 LAB — CULTURE, BAL-QUANTITATIVE W GRAM STAIN
Culture: 20000 — AB
Culture: NO GROWTH

## 2023-08-04 NOTE — Telephone Encounter (Signed)
 Called and spoke with the patient, she seen FNP 7/16 and was prescribed tramadol  & baclofen , states this seems to be helping pain. Also had CXR completed. I advised pt if pain starts worsening to be seen in UC or ED. Pt verbalized understanding. Nfn

## 2023-08-06 LAB — ANAEROBIC CULTURE W GRAM STAIN

## 2023-08-09 ENCOUNTER — Telehealth: Payer: Self-pay | Admitting: Acute Care

## 2023-08-09 ENCOUNTER — Encounter: Payer: Self-pay | Admitting: Acute Care

## 2023-08-09 ENCOUNTER — Telehealth: Payer: Self-pay | Admitting: Radiation Oncology

## 2023-08-09 ENCOUNTER — Ambulatory Visit: Admitting: Acute Care

## 2023-08-09 VITALS — BP 142/64 | HR 108 | Ht 66.0 in | Wt 222.2 lb

## 2023-08-09 DIAGNOSIS — C3490 Malignant neoplasm of unspecified part of unspecified bronchus or lung: Secondary | ICD-10-CM

## 2023-08-09 DIAGNOSIS — C3432 Malignant neoplasm of lower lobe, left bronchus or lung: Secondary | ICD-10-CM

## 2023-08-09 DIAGNOSIS — J988 Other specified respiratory disorders: Secondary | ICD-10-CM | POA: Diagnosis not present

## 2023-08-09 DIAGNOSIS — Z9889 Other specified postprocedural states: Secondary | ICD-10-CM

## 2023-08-09 DIAGNOSIS — F419 Anxiety disorder, unspecified: Secondary | ICD-10-CM

## 2023-08-09 MED ORDER — ALPRAZOLAM 0.25 MG PO TABS: ORAL_TABLET | ORAL | 0 refills | Status: DC

## 2023-08-09 NOTE — Progress Notes (Unsigned)
 History of Present Illness Tamara [name] is a 78 y.o. female former smoker ( Quit 2006 with a 40 pack year smoking history), followed by Dr. Byrum for an abnormal CT Chest.    Synopsis 78 year old woman with COPD and chronic cough. Dr. Shelah has  been following her for this as well as an abnormal CT scan of the chest. There is an area of left lower lobe rounded atelectasis that was benign on bronchoscopy. She has had waxing and waning associated pulmonary infiltrates since then, question recurrent aspiration pneumonia. She had unfortunately had recurrent several pneumonias .Dr. Shelah had been following CT Chest scans . The scan done 05/20/2023 showed a  large indistinct left lower lobe mass lesion similar in size and appearance with some internal groundglass and cystic change. Moderate patchy. Bronchovascular and peripheral reticulation and groundglass opacity bilaterally.  PET scan was done 06/14/2023 which showed the area in the left lower lobe was increased in size and had a component in the upper portion that was hypermetabolic with an SUV max of 6 ( Formerly 4.5)   The lower component is less hypermetabolic with maximum SUV of 3.4 (formerly 3.3). As there was increase In size and SUV, bronchoscopy was planned for 08/01/2023 for definitive diagnosis. Pt. Is here today to follow up after bronch with biopsies  to review cytology and ensure she is doing well after the procedure.    Past Medical History:  Diagnosis Date   Anemia    Arthritis    knee and shoulders   Colon polyps 09/05/2017   COPD (chronic obstructive pulmonary disease) (HCC)    Diabetes mellitus without complication (HCC)    type II   Dyspnea    05/16/20 has had a cough for 2.5 years post Covid   Gastritis 09/05/2017   GERD (gastroesophageal reflux disease)    Hyperlipidemia    Hypertension    patient denies   Hypothyroidism    Panic attack    Pneumonia    RUQ pain 03/09/2017   Per patient, she has had RUQ pain 4-5 years  that has grown worse in the last few months.      08/09/2023 Patient presents for follow-up after bronchoscopy with biopsies.  Patient states she did well after the procedure.  She denies any bleeding, fever, discolored secretions, worsening shortness of breath, or adverse reaction to anesthesia.  We have reviewed the results of her biopsy.  I explained that the biopsy of the left lower lobe was positive for adenocarcinoma.  I explained that this is a lung cancer.  I explained that it was a non-small cell lung cancer.  I started to discuss treatments for option with the patient and she immediately said she did not want to get anything done.  She stated she just felt like she was going to die.  I wanted to talk to her about getting referred to both radiation oncology and medical oncology to discuss her treatment options, and she told me there was no point that she was going to die.  When I talked to her about radiation she told me she was absolutely not going to have radiation.  We discussed the potential options of both medical oncology and radiation oncology. She has a history of  CAD that was stable at last check with cardiology, she is in a wheelchair, she has lower extremity edema. I asked her to please go and have the consults with the specialists so that they can give her her best options  for treatment.  I told her that treatment has changed over the last 5 years . I explained we have had tremendous advancements in care.  I have asked her to go for both consultations , learn about all her options , and then to make the best decision for her.  I told her that if she still felt that she did not want to have treatment  after she had spoken with both radiation oncology and medical oncology that we can help her with care to keep her comfortable.   She is also very concerned about money. She will need both social work and financial assistance through the cancer center.   Pt. Has been referred to  radiation oncology as well as medical oncology. I did not speak with her about surgery as she would barely discuss radiation or seeing medical oncology. I do not think she is a good candidate based on her co morbidities, but if either of the other specialists feels she is a good candidate she may be more open to consideration of that after she has processed the diagnosis. I have also ordered an MRI Brain to complete staging.I have sent in a one time 0.25 of xanax  to be taken 30 minutes before the scan. I have told she she is not to take either her pain medicine of muscle relaxer with the xanax . She verbalized understanding.  Test Results: FINAL MICROSCOPIC DIAGNOSIS:  A.  LEFT LUNG, LOWER LOBE, OPACITY, TARGET 1, FINE NEEDLE ASPIRATION  BIOPSY:  - Malignant  - Adenocarcinoma  B.  LEFT LUNG, LOWER LOBE, OPACITY, TARGET 1, BRUSHING:  - Malignant  - Adenocarcinoma   Micro Collected 08/01/2023 BAL >> No anaerobes isolated Negative for fungus AFB negative BAL >> 20,000 colonies of Stenotrophomonas Maltophilia     Latest Ref Rng & Units 08/01/2023   11:10 AM 04/11/2023    5:15 PM 10/27/2022   11:06 AM  CBC  WBC 4.0 - 10.5 K/uL 6.7  11.9  5.5   Hemoglobin 12.0 - 15.0 g/dL 8.7  89.7  9.3   Hematocrit 36.0 - 46.0 % 28.3  31.7  30.3   Platelets 150 - 400 K/uL 177  216  230        Latest Ref Rng & Units 08/01/2023   11:10 AM 04/11/2023    5:15 PM 10/27/2022   11:06 AM  BMP  Glucose 70 - 99 mg/dL 814  832  829   BUN 8 - 23 mg/dL 8  26  5    Creatinine 0.44 - 1.00 mg/dL 8.92  8.71  8.97   BUN/Creat Ratio 6 - 22 (calc)   5   Sodium 135 - 145 mmol/L 133  135  140   Potassium 3.5 - 5.1 mmol/L 3.9  4.6  4.8   Chloride 98 - 111 mmol/L 100  101  106   CO2 22 - 32 mmol/L 21  20  23    Calcium  8.9 - 10.3 mg/dL 9.7  89.5  9.6     BNP    Component Value Date/Time   BNP 33.7 09/29/2022 0621   BNP 16 10/21/2021 1431    ProBNP No results found for: PROBNP  PFT    Component Value Date/Time    FEV1PRE 1.91 08/18/2021 0955   FEV1POST 1.94 08/18/2021 0955   FVCPRE 2.27 08/18/2021 0955   FVCPOST 2.28 08/18/2021 0955   TLC 4.19 08/18/2021 0955   DLCOUNC 14.64 08/18/2021 0955   PREFEV1FVCRT 84 08/18/2021 0955   PSTFEV1FVCRT 85  08/18/2021 0955    DG Chest 2 View Result Date: 08/03/2023 CLINICAL DATA:  Shortness of breath after lung biopsy EXAM: CHEST - 2 VIEW COMPARISON:  August 01, 2023 FINDINGS: Increasing opacity in left mid lung compared to prior, may represent sequelae of prior lung biopsy/pulmonary hemorrhage. No pneumothorax. No pleural effusion. Unchanged cardiomediastinal silhouette. No acute osseous findings. IMPRESSION: Increasing left midlung opacity, likely representing sequelae of recent lung biopsy. No radiographically evident pneumothorax. Electronically Signed   By: Michaeline Blanch M.D.   On: 08/03/2023 11:03   DG Chest Port 1 View Result Date: 08/01/2023 CLINICAL DATA:  Bronchoscopy with biopsy EXAM: PORTABLE CHEST 1 VIEW COMPARISON:  CT of the chest performed May 20, 2023 FINDINGS: Similar appearance of left-sided lung lesion. Mild diffuse interstitial prominence. Heart mediastinum are not significantly changed. No pneumothorax or significant pleural effusion. IMPRESSION: 1. No pneumothorax. 2. Similar appearance of left-sided lung disease. Electronically Signed   By: Maude Naegeli M.D.   On: 08/01/2023 14:17   DG C-ARM BRONCHOSCOPY Result Date: 08/01/2023 C-ARM BRONCHOSCOPY: Fluoroscopy was utilized by the requesting physician.  No radiographic interpretation.   DG Chest 2 View Result Date: 07/29/2023 CLINICAL DATA:  Productive cough.  Left lung mass. EXAM: CHEST - 2 VIEW COMPARISON:  05/13/2023. FINDINGS: Unremarkable cardiac silhouette. There is pulmonary vascular congestion. Alveolar opacity left mid lung consistent with known mass. Compared to the previous examination the lesion appears slightly less dense. No pneumothorax or pleural effusion. Calcified aorta. Thoracic  degenerative changes. IMPRESSION: Left mid lung mass appears less dense than on the prior study which might be a treatment effect. Examination otherwise unchanged. Electronically Signed   By: Fonda Field M.D.   On: 07/29/2023 22:08     Past medical hx Past Medical History:  Diagnosis Date   Anemia    Arthritis    knee and shoulders   Colon polyps 09/05/2017   COPD (chronic obstructive pulmonary disease) (HCC)    Diabetes mellitus without complication (HCC)    type II   Dyspnea    05/16/20 has had a cough for 2.5 years post Covid   Gastritis 09/05/2017   GERD (gastroesophageal reflux disease)    Hyperlipidemia    Hypertension    patient denies   Hypothyroidism    Panic attack    Pneumonia    RUQ pain 03/09/2017   Per patient, she has had RUQ pain 4-5 years that has grown worse in the last few months.     Social History   Tobacco Use   Smoking status: Former    Current packs/day: 0.00    Average packs/day: 1 pack/day for 40.0 years (40.0 ttl pk-yrs)    Types: Cigarettes    Start date: 40    Quit date: 2006    Years since quitting: 19.5   Smokeless tobacco: Never   Tobacco comments:    Smoking cessation materials not required  Vaping Use   Vaping status: Never Used  Substance Use Topics   Alcohol use: No   Drug use: No    Ms.Zammit reports that she quit smoking about 19 years ago. Her smoking use included cigarettes. She started smoking about 59 years ago. She has a 40 pack-year smoking history. She has never used smokeless tobacco. She reports that she does not drink alcohol and does not use drugs.  Tobacco Cessation: Counseling given: Not Answered Tobacco comments: Smoking cessation materials not required Former smoker, quit 2006 with a 40-pack-year smoking history  Past surgical hx, Family hx,  Social hx all reviewed.  Current Outpatient Medications on File Prior to Visit  Medication Sig   Accu-Chek Softclix Lancets lancets TEST BLOOD SUGAR TWO TIMES  DAILY   albuterol  (PROVENTIL ) (2.5 MG/3ML) 0.083% nebulizer solution Take 3 mLs (2.5 mg total) by nebulization every 6 (six) hours as needed for wheezing or shortness of breath.   albuterol  (VENTOLIN  HFA) 108 (90 Base) MCG/ACT inhaler Inhale 2 puffs into the lungs every 6 (six) hours as needed for wheezing or shortness of breath.   Alcohol Swabs (DROPSAFE ALCOHOL PREP) 70 % PADS USE AS DIRECTED TWO TIMES DAILY WHEN TESTING BLOOD SUGAR   Ascorbic Acid  (VITAMIN C ) 1000 MG tablet Take 1,000 mg by mouth daily.   aspirin  EC 81 MG tablet Take 1 tablet (81 mg total) by mouth daily. Swallow whole.   Baclofen  5 MG TABS Take 1 tablet (5 mg total) by mouth 3 (three) times daily as needed (msk pain or spasms).   benzonatate  (TESSALON ) 200 MG capsule Take 1 capsule (200 mg total) by mouth 3 (three) times daily as needed.   Blood Glucose Monitoring Suppl (TRUE METRIX METER) w/Device KIT TEST BLOOD SUGAR TWICE DAILY   Cholecalciferol  (VITAMIN D ) 50 MCG (2000 UT) CAPS Take 1,000 Units by mouth daily.   DROPLET PEN NEEDLES 32G X 4 MM MISC USE AS DIRECTED   ezetimibe  (ZETIA ) 10 MG tablet TAKE 1 TABLET EVERY DAY   Ferrous Fumarate (HEMOCYTE - 106 MG FE) 324 (106 Fe) MG TABS tablet Take 1 tablet by mouth daily.   Ferrous Fumarate-DSS (FE CAPS/STOOL SOFTENER) 50-100 MG TABS Take 50-100 mg by mouth daily.   furosemide  (LASIX ) 40 MG tablet TAKE 1 TABLET EVERY DAY   hydrochlorothiazide  (MICROZIDE ) 12.5 MG capsule TAKE 1 CAPSULE EVERY DAY   Insulin  NPH, Human,, Isophane, (NOVOLIN  N FLEXPEN) 100 UNIT/ML Kiwkpen INJECT 40 UNITS UNDER THE SKIN IN THE MORNING AND 30 TO 30 UNITS AT BEDTIME   levothyroxine  (SYNTHROID ) 50 MCG tablet TAKE 1 TABLET EVERY DAY BEFORE BREAKFAST   metFORMIN  (GLUCOPHAGE ) 1000 MG tablet TAKE 1 TABLET TWICE DAILY   metoCLOPramide  (REGLAN ) 10 MG tablet Take 1 tablet (10 mg total) by mouth every 6 (six) hours as needed.   nystatin  (MYCOSTATIN /NYSTOP ) powder Apply 1 Application topically daily as needed  (yeast under belly).   pantoprazole  (PROTONIX ) 40 MG tablet TAKE 1 TABLET TWICE DAILY   potassium chloride  (KLOR-CON  M) 10 MEQ tablet Take 1 tablet (10 mEq total) by mouth daily.   rosuvastatin  (CRESTOR ) 20 MG tablet Take 1 tablet (20 mg total) by mouth at bedtime.   sucralfate  (CARAFATE ) 1 g tablet Take 1 tablet (1 g total) by mouth 4 (four) times daily.   Tiotropium Bromide -Olodaterol (STIOLTO RESPIMAT ) 2.5-2.5 MCG/ACT AERS Inhale 2 puffs into the lungs daily.   TRUE METRIX BLOOD GLUCOSE TEST test strip Use 2x a day   valsartan  (DIOVAN ) 80 MG tablet TAKE 1 TABLET (80 MG TOTAL) BY MOUTH DAILY.   No current facility-administered medications on file prior to visit.     Allergies  Allergen Reactions   Penicillins Hives    Tolerated rocephin  09/2022 Reaction: 1965    Review Of Systems:  Constitutional:   No  weight loss, night sweats,  Fevers, chills, fatigue, or  lassitude.  HEENT:   No headaches,  Difficulty swallowing,  Tooth/dental problems, or  Sore throat,                No sneezing, itching, ear ache, nasal congestion, post nasal drip,  CV:  No chest pain,  Orthopnea, PND, swelling in lower extremities, anasarca, dizziness, palpitations, syncope.   GI  No heartburn, indigestion, abdominal pain, nausea, vomiting, diarrhea, change in bowel habits, loss of appetite, bloody stools.   Resp: No shortness of breath with exertion or at rest.  No excess mucus, no productive cough,  No non-productive cough,  No coughing up of blood.  No change in color of mucus.  No wheezing.  No chest wall deformity  Skin: no rash or lesions.  GU: no dysuria, change in color of urine, no urgency or frequency.  No flank pain, no hematuria   MS:  No joint pain or swelling.  No decreased range of motion.  No back pain.  Psych:  No change in mood or affect. No depression or anxiety.  No memory loss.   Vital Signs BP (!) 142/64 (BP Location: Right Arm, Patient Position: Sitting, Cuff Size: Large)    Pulse (!) 108   Ht 5' 6 (1.676 m)   Wt 222 lb 3.2 oz (100.8 kg)   SpO2 96%   BMI 35.86 kg/m    Physical Exam:  General- No distress,  A&Ox3, anxious ENT: No sinus tenderness, TM clear, pale nasal mucosa, no oral exudate,no post nasal drip, no LAN Cardiac: S1, S2, regular rate and rhythm, no murmur Chest: No wheeze/ rales/ dullness; no accessory muscle use, no nasal flaring, no sternal retractions, diminished per bases bilaterally Abd.: Soft Non-tender, nondistended, bowel sounds positive,Body mass index is 35.86 kg/m.  Ext: No clubbing cyanosis, edema, no obvious skin deformities Neuro:  normal strength, moving all extremities x 4, alert and oriented x 3, appropriate Skin: No rashes, warm and dry, no obvious skin lesions Psych: normal mood and behavior   Assessment/Plan New diagnosis adenocarcinoma of the lung Post bronchoscopy with biopsies Former smoker BAL culture positive for 20,000 colonies of Stenotrophomonas Maltophilia>> sensitive to Bactrim  and Levaquin  Plan I am glad you have done well after the bronchoscopy. We reviewed your biopsy results and they were positive for a non-small cell lung cancer called adenocarcinoma in the left lower lobe. I have referred you to both medical oncology, and radiation oncology to learn more about your treatment options. You will get phone calls from both to schedule these appointments. Please learn about each treatment option and then make the best decision for you. I have also ordered a MRI of the brain to complete the staging process. You will get a call to get this scheduled. I have ordered a Xanax  one-time tablet to be taken 30 minutes before the MRI of the brain to help you with anxiety. Please do not take other sedating medications before you take the Xanax . Please follow-up with cardiology regarding the lower extremity swelling. It has been almost a year since she last saw them. Please follow-up with your primary care physician  about getting a referral to a gastrointestinal doctor to evaluate your stomach. They will take great care of you at the cancer center. Good luck with your treatment. Call if you need us . Please contact office for sooner follow up if symptoms do not improve or worsen or seek emergency care  One of your cultures was positive for a bacteria. This bacteria is called Stenotrophomonas Maltophilia I will send in a prescription for Bactrim  to treat this. Take 1 tablet twice daily x 1 week Please eat activity a yogurt or take a probiotic while taking antibiotic Please be aware that this medication can interact with your metformin  so  please monitor carefully for low blood sugars.  I spent 50 minutes dedicated to the care of this patient on the date of this encounter to include pre-visit review of records, face-to-face time with the patient discussing conditions above, post visit ordering of testing, clinical documentation with the electronic health record, making appropriate referrals as documented, and communicating necessary information to the patient's healthcare team.   Lauraine JULIANNA Lites, NP 08/09/2023  11:38 PM

## 2023-08-09 NOTE — Telephone Encounter (Signed)
 7/22 @ 1:26 pm patient not available at this time requested a call later to be sch for consult.

## 2023-08-09 NOTE — Patient Instructions (Addendum)
 It is good to see you today. I am glad you have done well after the bronchoscopy. We reviewed your biopsy results and they were positive for a non-small cell lung cancer called adenocarcinoma in the left lower lobe. I have referred you to both medical oncology, and radiation oncology to learn more about your treatment options. You will get phone calls from both to schedule these appointments. Please learn about each treatment option and then make the best decision for you. I have also ordered a MRI of the brain to complete the staging process. You will get a call to get this scheduled. I have ordered a Xanax  one-time tablet to be taken 30 minutes before the MRI of the brain to help you with anxiety. Please do not take other sedating medications before you take the Xanax . Please follow-up with cardiology regarding the lower extremity swelling. It has been almost a year since she last saw them. Please follow-up with your primary care physician about getting a referral to a gastrointestinal doctor to evaluate your stomach. They will take great care of you at the cancer center. Good luck with your treatment. Call if you need us . Please contact office for sooner follow up if symptoms do not improve or worsen or seek emergency care

## 2023-08-09 NOTE — Telephone Encounter (Signed)
 It's a Grade C interaction for hypoglycemia. Insulin  has higher risk for causing hypoglycemia and metformin  does not have hypoglycemia risk.  She may need to adjust her insulin  NPH dosing depending on how labile her blood glucose is - really should just be vigilant with checking blood glucose while on levofloxacin  course. Higher risk of hypoglycemia the first few days of levofloxacin  course and possible risk for hyperglycemia at tail end of course.  Sherry Pennant, PharmD, MPH, BCPS, CPP Clinical Pharmacist (Rheumatology and Pulmonology)

## 2023-08-09 NOTE — Telephone Encounter (Signed)
 This patient had a bronch and her BAL grew 20,000 COLONIES/mL STENOTROPHOMONAS MALTOPHILIA Abnormal .It is sensitive to both Levaquin  and Bactrim . She is also on Metformin . Do I need to decrease her metformin  while she is on Bactrim ?

## 2023-08-10 ENCOUNTER — Telehealth: Payer: Self-pay | Admitting: Acute Care

## 2023-08-10 ENCOUNTER — Telehealth: Payer: Self-pay

## 2023-08-10 ENCOUNTER — Telehealth: Payer: Self-pay | Admitting: Radiation Oncology

## 2023-08-10 MED ORDER — SULFAMETHOXAZOLE-TRIMETHOPRIM 800-160 MG PO TABS: 1.0000 | ORAL_TABLET | Freq: Two times a day (BID) | ORAL | 0 refills | Status: DC

## 2023-08-10 NOTE — Telephone Encounter (Signed)
 I have called the patient back.  I wanted to explain to her that the antibiotic that we have prescribed does interact with her metformin  and can put her at increased risk for low blood sugar.  I explained that I did not want to prescribe it to her until she and I had talked and she understood about this risk.  She states she does check blood sugars and I have asked her to check them more frequently while she is on the Bactrim .  She has agreed to do this.  She said she does not think it will be an issue as her blood sugars have been running high with the steroid she gets through her inhalers and her nebulizers.  Patient understands blood sugars can run low on Bactrim .  She states she will check blood sugars more frequently.  She states she does know how she feels when her blood sugars are low to alert her.  I have also asked her to take probiotic with antibiotic while she is on the Bactrim .  She prefers to use the activity yogurt versus the over-the-counter probiotics.  Patient verbalized understanding of the above and had no further questions at completion of the phone call.

## 2023-08-10 NOTE — Telephone Encounter (Signed)
 Copied from CRM [id number]. Topic: Clinical - Prescription Issue >> Aug 10, 2023 11:11 AM Benton KIDD wrote: Reason for CRM: patient is calling wanting to speak with nurse or doctor because they were suppose to send some antibiotics in for lung infection but no one has sent anything in . Please give patient a call  [phone]    ATC X1. LMTCB

## 2023-08-10 NOTE — Telephone Encounter (Signed)
 Patient stated she needs Antibiotics for Lungs and wasn't able to get them yesterday.

## 2023-08-10 NOTE — Telephone Encounter (Signed)
 Yes - patient should monitor blood glucose closely. If significant fluctuations from her baseline values, she should refer to endo for adjustment of insulin  dosing  Esme Freund, PharmD, MPH, BCPS, CPP Clinical Pharmacist (Rheumatology and Pulmonology)

## 2023-08-10 NOTE — Telephone Encounter (Signed)
 7/23 @ 8:52 am Left voicemail for patient to call our office to be sch for consult.

## 2023-08-10 NOTE — Progress Notes (Signed)
 I agree with the plans as outlined above.   Lamar Chris, MD, PhD 08/10/2023, 1:46 PM [facility] [phone] or if no answer before 7:00PM call [phone] For any issues after 7:00PM please call eLink [phone]

## 2023-08-11 ENCOUNTER — Telehealth: Payer: Self-pay | Admitting: Radiation Oncology

## 2023-08-11 ENCOUNTER — Other Ambulatory Visit: Payer: Self-pay | Admitting: *Deleted

## 2023-08-11 NOTE — Telephone Encounter (Signed)
 7/24 @ 9:20 am Left voicemail and spoke to patient's spouse, will give message to call our office to be sch for consult.

## 2023-08-11 NOTE — Progress Notes (Signed)
 The proposed treatment discussed in conference is for discussion purpose only and is not a binding recommendation.  The patients have not been physically examined, or presented with their treatment options.  Therefore, final treatment plans cannot be decided.

## 2023-08-12 ENCOUNTER — Inpatient Hospital Stay: Admitting: Nurse Practitioner

## 2023-08-15 ENCOUNTER — Other Ambulatory Visit: Payer: Self-pay

## 2023-08-15 ENCOUNTER — Other Ambulatory Visit (HOSPITAL_COMMUNITY)

## 2023-08-15 ENCOUNTER — Telehealth: Payer: Self-pay | Admitting: Radiation Oncology

## 2023-08-15 ENCOUNTER — Encounter: Payer: Self-pay | Admitting: Internal Medicine

## 2023-08-15 ENCOUNTER — Ambulatory Visit (INDEPENDENT_AMBULATORY_CARE_PROVIDER_SITE_OTHER): Admitting: Internal Medicine

## 2023-08-15 ENCOUNTER — Other Ambulatory Visit: Payer: Self-pay | Admitting: Acute Care

## 2023-08-15 VITALS — BP 130/82 | HR 100 | Temp 98.2°F | Resp 18 | Ht 66.0 in | Wt 223.0 lb

## 2023-08-15 DIAGNOSIS — K219 Gastro-esophageal reflux disease without esophagitis: Secondary | ICD-10-CM

## 2023-08-15 DIAGNOSIS — M545 Low back pain, unspecified: Secondary | ICD-10-CM | POA: Diagnosis not present

## 2023-08-15 DIAGNOSIS — I10 Essential (primary) hypertension: Secondary | ICD-10-CM | POA: Diagnosis not present

## 2023-08-15 DIAGNOSIS — R4589 Other symptoms and signs involving emotional state: Secondary | ICD-10-CM

## 2023-08-15 DIAGNOSIS — J984 Other disorders of lung: Secondary | ICD-10-CM | POA: Diagnosis not present

## 2023-08-15 DIAGNOSIS — E785 Hyperlipidemia, unspecified: Secondary | ICD-10-CM | POA: Diagnosis not present

## 2023-08-15 DIAGNOSIS — J988 Other specified respiratory disorders: Secondary | ICD-10-CM

## 2023-08-15 MED ORDER — HYDROCHLOROTHIAZIDE 12.5 MG PO CAPS: 12.5000 mg | ORAL_CAPSULE | Freq: Every day | ORAL | 1 refills | Status: AC

## 2023-08-15 MED ORDER — TRAMADOL HCL 50 MG PO TABS: 50.0000 mg | ORAL_TABLET | Freq: Every day | ORAL | 2 refills | Status: DC | PRN

## 2023-08-15 MED ORDER — SULFAMETHOXAZOLE-TRIMETHOPRIM 800-160 MG PO TABS: 1.0000 | ORAL_TABLET | Freq: Two times a day (BID) | ORAL | 0 refills | Status: DC

## 2023-08-15 MED ORDER — ALPRAZOLAM 0.5 MG PO TABS: 0.5000 mg | ORAL_TABLET | Freq: Once | ORAL | 0 refills | Status: AC

## 2023-08-15 MED ORDER — ROSUVASTATIN CALCIUM 20 MG PO TABS: 20.0000 mg | ORAL_TABLET | Freq: Every day | ORAL | 1 refills | Status: AC

## 2023-08-15 MED ORDER — BACLOFEN 5 MG PO TABS: 5.0000 mg | ORAL_TABLET | Freq: Every evening | ORAL | 2 refills | Status: DC | PRN

## 2023-08-15 NOTE — Progress Notes (Signed)
 I have called the patient . When we discussed her in the thoracic conference last week .Dr. Sherrod asked that she be treated x 2 weeks with bactrim  for her STENOTROPHOMONAS MALTOPHILIA . She verbalized understanding. I have sent in an additional week of treatment. She is aware of monitoring her blood sugars carefully while on this drug with her metformin . She verbalized understanding. She has an appointment with oncology 08/23/2023.

## 2023-08-15 NOTE — Progress Notes (Signed)
 Established Patient Office Visit  Subjective   Patient ID: Tamara [name], female    DOB: 1945/09/19  Age: 78 y.o. MRN: [id number]  Chief Complaint  Patient presents with   Hospitalization Follow-up   Lung Cancer    HPI  Patient is here for hospital follow up where unfortunately she was diagnosed with new adenocarcinoma of the lung.   Discussed the use of AI scribe software for clinical note transcription with the patient, who gave verbal consent to proceed.  History of Present Illness Tamara [name] is a 78 year old female with lung cancer who presents for a hospital follow-up regarding her respiratory issues.  She experiences significant shortness of breath and uses oxygen for relief, though not continuously. She is currently on antibiotics for a lung infection, taking them twice daily. She has lung cancer and has declined chemotherapy, with uncertainty about radiation treatment. A brain MRI is scheduled for cancer staging, and she will soon see a general oncologist and pulmonologist. She is worried the Xanax  prescribed will not be enough, recommend taking 0.25 mg the night before and I will send a one time dose of 0.5 mg for the procedure.  She takes baclofen  for muscle relaxation and tramadol  for pain management at night. She has stopped meloxicam  due to stomach issues. She is on metformin  and insulin  for diabetes, with blood sugar at 88 this morning. Her inhalers and nebulizers contain steroids, affecting her blood sugar levels. She follows with Endocrinology, currently using Novolin  40 units in the morning and 36 units in the evening.   She experiences persistent soreness between her ribs and suspects an ulcer. She seeks a new referral to a gastroenterologist in [place] due to long local wait times.    Review of Systems  Respiratory:  Positive for cough, sputum production, shortness of breath and wheezing.       Objective:     BP 130/82 (Cuff Size: Large)    Pulse 100   Temp 98.2 F (36.8 C) (Oral)   Resp 18   Ht 5' 6 (1.676 m)   Wt 223 lb (101.2 kg)   SpO2 95%   BMI 35.99 kg/m  BP Readings from Last 3 Encounters:  08/15/23 130/82  08/09/23 (!) 142/64  08/03/23 (!) 116/58   Wt Readings from Last 3 Encounters:  08/15/23 223 lb (101.2 kg)  08/09/23 222 lb 3.2 oz (100.8 kg)  08/01/23 224 lb (101.6 kg)      Physical Exam Constitutional:      Appearance: Normal appearance.  HENT:     Head: Normocephalic and atraumatic.  Eyes:     Conjunctiva/sclera: Conjunctivae normal.  Pulmonary:     Effort: Pulmonary effort is normal.     Breath sounds: Rhonchi present.     Comments: Using supplemental 2 L [place] Skin:    General: Skin is warm and dry.  Neurological:     General: No focal deficit present.     Mental Status: She is alert. Mental status is at baseline.  Psychiatric:        Mood and Affect: Mood normal.        Behavior: Behavior normal.      No results found for any visits on 08/15/23.  Last CBC Lab Results  Component Value Date   WBC 6.7 08/01/2023   HGB 8.7 (L) 08/01/2023   HCT 28.3 (L) 08/01/2023   MCV 80.4 08/01/2023   MCH 24.7 (L) 08/01/2023   RDW 14.9 08/01/2023   PLT  177 08/01/2023   Last metabolic panel Lab Results  Component Value Date   GLUCOSE 185 (H) 08/01/2023   NA 133 (L) 08/01/2023   K 3.9 08/01/2023   CL 100 08/01/2023   CO2 21 (L) 08/01/2023   BUN 8 08/01/2023   CREATININE 1.07 (H) 08/01/2023   GFRNONAA 54 (L) 08/01/2023   CALCIUM  9.7 08/01/2023   PHOS 3.9 09/26/2022   PROT 6.5 10/27/2022   ALBUMIN 3.0 (L) 10/17/2022   BILITOT 0.5 10/27/2022   ALKPHOS 55 10/17/2022   AST 13 10/27/2022   ALT 10 10/27/2022   ANIONGAP 12 08/01/2023   Last lipids Lab Results  Component Value Date   CHOL 149 05/19/2023   HDL 47 (L) 05/19/2023   LDLCALC 71 05/19/2023   LDLDIRECT 64 03/15/2022   TRIG 216 (H) 05/19/2023   CHOLHDL 3.2 05/19/2023   Last hemoglobin A1c Lab Results  Component Value  Date   HGBA1C 7.7 (A) 05/19/2023   Last thyroid  functions Lab Results  Component Value Date   TSH 3.33 10/27/2022   Last vitamin D  No results found for: MARIEN BOLLS, VD25OH Last vitamin B12 and Folate Lab Results  Component Value Date   VITAMINB12 469 05/26/2017   FOLATE 15.3 10/18/2022      The 10-year ASCVD risk score (Arnett DK, et al., 2019) is: 40.3%    Assessment & Plan:   Assessment & Plan Adenocarcinoma Lung Cancer Diagnosed with lung cancer, staging pending. Declined chemotherapy and radiation therapy due to concerns about side effects. Encouraged to consult oncologists for detailed information. - Consult oncologists to discuss treatment options and staging. - Schedule brain MRI on August 18, 2023, to assist in staging. - Follow up with medical oncology on August 23, 2023, for staging and treatment discussion. - Follow up with pulmonologist on August 24, 2023, for respiratory management.  Pain Management Managing pain with tramadol  and baclofen . Discontinued meloxicam  due to gastrointestinal side effects. - Prescribe tramadol  for pain management with note on controlled substance regulations. - Prescribe baclofen  for muscle relaxation. - Discontinue meloxicam  due to gastrointestinal side effects.  Diabetes Mellitus Type 2 Blood sugar levels well-managed. On metformin  and insulin  therapy. A1c test deferred until next endocrinology appointment in September. - Continue current diabetes management with metformin  and insulin  as prescribed by endocrinologist. - Defer A1c testing until endocrinology appointment in September.  Gastrointestinal Symptoms Reports persistent soreness between ribs, suspecting an ulcer. Requested referral to gastroenterologist. - Refer to Dr. Federico, a gastroenterologist in [place], for evaluation of gastrointestinal symptoms.  Anxiety Experiencing anxiety related to upcoming MRI. Prescribed low dose Xanax  by pulmonologist. -  Prescribe Xanax  0.5 mg to take 30 minutes before MRI on August 18, 2023. - Instruct to take previously prescribed Xanax  0.25 mg at bedtime the night before MRI.  General Health Maintenance Blood pressure and cholesterol medications due for refills. - Refill blood pressure and cholesterol medications as needed.  - traMADol  (ULTRAM ) 50 MG tablet; Take 1 tablet (50 mg total) by mouth daily as needed for severe pain (pain score 7-10).  Dispense: 30 tablet; Refill: 2 - Baclofen  5 MG TABS; Take 1 tablet (5 mg total) by mouth at bedtime as needed (msk pain or spasms).  Dispense: 30 tablet; Refill: 2 - rosuvastatin  (CRESTOR ) 20 MG tablet; Take 1 tablet (20 mg total) by mouth at bedtime.  Dispense: 90 tablet; Refill: 1 - hydrochlorothiazide  (MICROZIDE ) 12.5 MG capsule; Take 1 capsule (12.5 mg total) by mouth daily.  Dispense: 90 capsule; Refill: 1 - Ambulatory  referral to Gastroenterology - ALPRAZolam  (XANAX ) 0.5 MG tablet; Take 1 tablet (0.5 mg total) by mouth once for 1 dose. Take 30 minutes prior to brain MRI.  Dispense: 1 tablet; Refill: 0   Return in about 3 months (around 11/15/2023).    Sharyle Fischer, DO

## 2023-08-15 NOTE — Telephone Encounter (Signed)
 7/28 @ 9:22 am patient not available at this time would like a call back at by 3:00 pm.

## 2023-08-18 ENCOUNTER — Ambulatory Visit (HOSPITAL_COMMUNITY)
Admission: RE | Admit: 2023-08-18 | Discharge: 2023-08-18 | Disposition: A | Discharge status: Home / Self Care | Source: Ambulatory Visit | Attending: Acute Care | Admitting: Acute Care

## 2023-08-18 ENCOUNTER — Encounter (HOSPITAL_COMMUNITY): Payer: Self-pay

## 2023-08-18 ENCOUNTER — Other Ambulatory Visit: Payer: Self-pay | Admitting: Acute Care

## 2023-08-18 DIAGNOSIS — C3490 Malignant neoplasm of unspecified part of unspecified bronchus or lung: Secondary | ICD-10-CM

## 2023-08-18 DIAGNOSIS — Q048 Other specified congenital malformations of brain: Secondary | ICD-10-CM | POA: Diagnosis not present

## 2023-08-18 DIAGNOSIS — I6782 Cerebral ischemia: Secondary | ICD-10-CM | POA: Diagnosis not present

## 2023-08-18 DIAGNOSIS — Z1389 Encounter for screening for other disorder: Secondary | ICD-10-CM | POA: Diagnosis not present

## 2023-08-18 MED ORDER — GADOBUTROL 1 MMOL/ML IV SOLN
10.0000 mL | Freq: Once | INTRAVENOUS | Status: AC | PRN
  Administered 2023-08-18: 10 mL via INTRAVENOUS

## 2023-08-19 ENCOUNTER — Telehealth: Payer: Self-pay | Admitting: Acute Care

## 2023-08-19 ENCOUNTER — Telehealth: Payer: Self-pay

## 2023-08-19 NOTE — Progress Notes (Unsigned)
 [facility] [phone]   [phone]  CONSULT NOTE  REFERRING PHYSICIAN: Lauraine Lites NP  REASON FOR CONSULTATION:  Adenocarcinoma  HPI Tamara [name] is a 78 y.o. female with a past medical history significant for hypertension, coronary artery disease, COPD, pneumonia, sleep apnea, GERD, cirrhosis, diabetes, and hyperlipidemia is referred to the clinic for adenocarcinoma.  The patient was followed by pulmonary medicine for a lower lobe lesion. This was first seen on imaging in March 2022.  This was previously biopsied in 2022 and was negative for malignancy at that time.   The patient had a CT super D of the chest on 05/20/2023 to follow-up on ***.  This revealed a large predominantly solid indistinct left lower lobe lung mass with internal groundglass and cystic areas measuring 7.7 x 5.9 x 11.8 cm which had not changed since the prior scan on 01/14/2023.  The patient was seen by Dr. Shelah who arranged for a PET scan which was performed on 06/14/2023 revealing the chronic airspace filling process of the left lower lobe is mildly increased in size and has mild increase in hypermetabolic activity in the upper component it hypermetabolic with a maximum SUV of 6.0 formerly 4.5 in the lower component has a max SUV of 3.4.  There is also other patchy airspace opacities in both lungs associated with mild accentuated metabolic activity.  The patient had a bronchoscopy on 08/01/2023 and the final pathology ([id number]) showed adenocarcinoma of the LLL FNA.   The patient is also being treated with two weeks of bactrim  for stenotrophomonas maltophilia.    Her staging brain MRI was negative for metastatic disease.   Her case was discussed that the multidisciplinary conference on 08/11/23 and the recommendation was obtaining PFTs and discuss possible surgery with patient with neoadjuvant chemotherapy for 3 cycles and then evaluate for possible surgery or  radiation.   Her PFTs are scheduled for 08/24/23. She is scheduled to see Dr. Shannon on ***. Surgery***?   Overall the patient is feeling ***today.  She denies any fever, chills, or night sweats.  Appetite and weight?  Breathing?  Shortness of breath?  Chest pain?  She denies any hemoptysis or chest pain.  She denies any nausea, vomiting, diarrhea, or constipation.  Denies any headache or visual changes.       HPI  Past Medical History:  Diagnosis Date   Anemia    Arthritis    knee and shoulders   Colon polyps 09/05/2017   COPD (chronic obstructive pulmonary disease) (HCC)    Diabetes mellitus without complication (HCC)    type II   Dyspnea    05/16/20 has had a cough for 2.5 years post Covid   Gastritis 09/05/2017   GERD (gastroesophageal reflux disease)    Hyperlipidemia    Hypertension    patient denies   Hypothyroidism    Panic attack    Pneumonia    RUQ pain 03/09/2017   Per patient, she has had RUQ pain 4-5 years that has grown worse in the last few months.    Past Surgical History:  Procedure Laterality Date   BREAST BIOPSY Left 2008   CORE W/CLIP - NEG   BRONCHIAL BIOPSY  05/19/2020   Procedure: BRONCHIAL BIOPSIES;  Surgeon: Shelah Lamar RAMAN, MD;  Location: [facility] ENDOSCOPY;  Service: Pulmonary;;   BRONCHIAL BIOPSY  08/01/2023   Procedure: BRONCHOSCOPY, WITH BIOPSY;  Surgeon: Shelah Lamar RAMAN, MD;  Location: [facility] ENDOSCOPY;  Service: Pulmonary;;   BRONCHIAL  BRUSHINGS  05/19/2020   Procedure: BRONCHIAL BRUSHINGS;  Surgeon: Shelah Lamar RAMAN, MD;  Location: [facility] ENDOSCOPY;  Service: Pulmonary;;   BRONCHIAL BRUSHINGS  08/01/2023   Procedure: BRONCHOSCOPY, WITH BRUSH BIOPSY;  Surgeon: Shelah Lamar RAMAN, MD;  Location: [facility] ENDOSCOPY;  Service: Pulmonary;;   BRONCHIAL NEEDLE ASPIRATION BIOPSY  05/19/2020   Procedure: BRONCHIAL NEEDLE ASPIRATION BIOPSIES;  Surgeon: Shelah Lamar RAMAN, MD;  Location: [facility] ENDOSCOPY;  Service: Pulmonary;;   BRONCHIAL NEEDLE ASPIRATION BIOPSY  08/01/2023    Procedure: BRONCHOSCOPY, WITH NEEDLE ASPIRATION BIOPSY;  Surgeon: Shelah Lamar RAMAN, MD;  Location: [facility] ENDOSCOPY;  Service: Pulmonary;;   BRONCHIAL WASHINGS  05/19/2020   Procedure: BRONCHIAL WASHINGS;  Surgeon: Shelah Lamar RAMAN, MD;  Location: [facility] ENDOSCOPY;  Service: Pulmonary;;   BRONCHIAL WASHINGS  08/01/2023   Procedure: IRRIGATION, BRONCHUS;  Surgeon: Shelah Lamar RAMAN, MD;  Location: [facility] ENDOSCOPY;  Service: Pulmonary;;   BRONCHOSCOPY, WITH BIOPSY USING ELECTROMAGNETIC NAVIGATION Left 08/01/2023   Procedure: BRONCHOSCOPY, WITH BIOPSY USING ELECTROMAGNETIC NAVIGATION;  Surgeon: Shelah Lamar RAMAN, MD;  Location: [facility] ENDOSCOPY;  Service: Pulmonary;  Laterality: Left;   COLONOSCOPY WITH PROPOFOL  N/A 02/08/2017   Procedure: COLONOSCOPY WITH PROPOFOL ;  Surgeon: Gaylyn Gladis PENNER, MD;  Location: [facility] ENDOSCOPY;  Service: Endoscopy;  Laterality: N/A;   COLONOSCOPY WITH PROPOFOL  N/A 05/05/2022   Procedure: COLONOSCOPY WITH PROPOFOL ;  Surgeon: Unk Corinn Skiff, MD;  Location: [facility] ENDOSCOPY;  Service: Gastroenterology;  Laterality: N/A;   ESOPHAGOGASTRODUODENOSCOPY (EGD) WITH PROPOFOL  N/A 02/08/2017   Procedure: ESOPHAGOGASTRODUODENOSCOPY (EGD) WITH PROPOFOL ;  Surgeon: Gaylyn Gladis PENNER, MD;  Location: [facility] ENDOSCOPY;  Service: Endoscopy;  Laterality: N/A;   TUBAL LIGATION     VIDEO BRONCHOSCOPY WITH ENDOBRONCHIAL NAVIGATION N/A 05/19/2020   Procedure: VIDEO BRONCHOSCOPY WITH ENDOBRONCHIAL NAVIGATION;  Surgeon: Shelah Lamar RAMAN, MD;  Location: [facility] ENDOSCOPY;  Service: Pulmonary;  Laterality: N/A;    Family History  Problem Relation Age of Onset   Diabetes Maternal Grandmother    Breast cancer Neg Hx     Social History Social History   Tobacco Use   Smoking status: Former    Current packs/day: 0.00    Average packs/day: 1 pack/day for 40.0 years (40.0 ttl pk-yrs)    Types: Cigarettes    Start date: 1966    Quit date: 2006    Years since quitting: 19.5   Smokeless tobacco: Never   Tobacco  comments:    Smoking cessation materials not required  Vaping Use   Vaping status: Never Used  Substance Use Topics   Alcohol use: No   Drug use: No    Allergies  Allergen Reactions   Penicillins Hives    Tolerated rocephin  09/2022 Reaction: 1965    Current Outpatient Medications  Medication Sig Dispense Refill   Accu-Chek Softclix Lancets lancets TEST BLOOD SUGAR TWO TIMES DAILY 200 each 3   albuterol  (PROVENTIL ) (2.5 MG/3ML) 0.083% nebulizer solution Take 3 mLs (2.5 mg total) by nebulization every 6 (six) hours as needed for wheezing or shortness of breath. 180 mL 5   albuterol  (VENTOLIN  HFA) 108 (90 Base) MCG/ACT inhaler Inhale 2 puffs into the lungs every 6 (six) hours as needed for wheezing or shortness of breath. 8 g 6   Alcohol Swabs (DROPSAFE ALCOHOL PREP) 70 % PADS USE AS DIRECTED TWO TIMES DAILY WHEN TESTING BLOOD SUGAR 200 each 3   Ascorbic Acid  (VITAMIN C ) 1000 MG tablet Take 1,000 mg by mouth daily.     aspirin  EC 81 MG tablet Take 1 tablet (81 mg  total) by mouth daily. Swallow whole. 90 tablet 3   Baclofen  5 MG TABS Take 1 tablet (5 mg total) by mouth at bedtime as needed (msk pain or spasms). 30 tablet 2   benzonatate  (TESSALON ) 200 MG capsule Take 1 capsule (200 mg total) by mouth 3 (three) times daily as needed. 45 capsule 1   Blood Glucose Monitoring Suppl (TRUE METRIX METER) w/Device KIT TEST BLOOD SUGAR TWICE DAILY 1 kit 3   Cholecalciferol  (VITAMIN D ) 50 MCG (2000 UT) CAPS Take 1,000 Units by mouth daily.     DROPLET PEN NEEDLES 32G X 4 MM MISC USE AS DIRECTED 200 each 3   ezetimibe  (ZETIA ) 10 MG tablet TAKE 1 TABLET EVERY DAY 90 tablet 1   Ferrous Fumarate (HEMOCYTE - 106 MG FE) 324 (106 Fe) MG TABS tablet Take 1 tablet by mouth daily.     Ferrous Fumarate-DSS (FE CAPS/STOOL SOFTENER) 50-100 MG TABS Take 50-100 mg by mouth daily. 30 tablet 3   furosemide  (LASIX ) 40 MG tablet TAKE 1 TABLET EVERY DAY 90 tablet 1   hydrochlorothiazide  (MICROZIDE ) 12.5 MG capsule  Take 1 capsule (12.5 mg total) by mouth daily. 90 capsule 1   Insulin  NPH, Human,, Isophane, (NOVOLIN  N FLEXPEN) 100 UNIT/ML Kiwkpen INJECT 40 UNITS UNDER THE SKIN IN THE MORNING AND 30 TO 30 UNITS AT BEDTIME 75 mL 3   levothyroxine  (SYNTHROID ) 50 MCG tablet TAKE 1 TABLET EVERY DAY BEFORE BREAKFAST 90 tablet 3   metFORMIN  (GLUCOPHAGE ) 1000 MG tablet TAKE 1 TABLET TWICE DAILY 180 tablet 3   metoCLOPramide  (REGLAN ) 10 MG tablet Take 1 tablet (10 mg total) by mouth every 6 (six) hours as needed. 30 tablet 0   nystatin  (MYCOSTATIN /NYSTOP ) powder Apply 1 Application topically daily as needed (yeast under belly). 60 g 2   pantoprazole  (PROTONIX ) 40 MG tablet TAKE 1 TABLET TWICE DAILY 180 tablet 3   potassium chloride  (KLOR-CON  M) 10 MEQ tablet Take 1 tablet (10 mEq total) by mouth daily. 90 tablet 2   rosuvastatin  (CRESTOR ) 20 MG tablet Take 1 tablet (20 mg total) by mouth at bedtime. 90 tablet 1   sucralfate  (CARAFATE ) 1 g tablet Take 1 tablet (1 g total) by mouth 4 (four) times daily. 120 tablet 1   sulfamethoxazole -trimethoprim  (BACTRIM  DS) 800-160 MG tablet Take 1 tablet by mouth 2 (two) times daily. 14 tablet 0   sulfamethoxazole -trimethoprim  (BACTRIM  DS) 800-160 MG tablet Take 1 tablet by mouth 2 (two) times daily. 14 tablet 0   Tiotropium Bromide -Olodaterol (STIOLTO RESPIMAT ) 2.5-2.5 MCG/ACT AERS Inhale 2 puffs into the lungs daily. 4 g 11   traMADol  (ULTRAM ) 50 MG tablet Take 1 tablet (50 mg total) by mouth daily as needed for severe pain (pain score 7-10). 30 tablet 2   TRUE METRIX BLOOD GLUCOSE TEST test strip Use 2x a day 200 each 3   valsartan  (DIOVAN ) 80 MG tablet TAKE 1 TABLET (80 MG TOTAL) BY MOUTH DAILY. 90 tablet 3   No current facility-administered medications for this visit.    REVIEW OF SYSTEMS:   Review of Systems  Constitutional: Negative for appetite change, chills, fatigue, fever and unexpected weight change.  HENT:   Negative for mouth sores, nosebleeds, sore throat and  trouble swallowing.   Eyes: Negative for eye problems and icterus.  Respiratory: Negative for cough, hemoptysis, shortness of breath and wheezing.   Cardiovascular: Negative for chest pain and leg swelling.  Gastrointestinal: Negative for abdominal pain, constipation, diarrhea, nausea and vomiting.  Genitourinary: Negative for  bladder incontinence, difficulty urinating, dysuria, frequency and hematuria.   Musculoskeletal: Negative for back pain, gait problem, neck pain and neck stiffness.  Skin: Negative for itching and rash.  Neurological: Negative for dizziness, extremity weakness, gait problem, headaches, light-headedness and seizures.  Hematological: Negative for adenopathy. Does not bruise/bleed easily.  Psychiatric/Behavioral: Negative for confusion, depression and sleep disturbance. The patient is not nervous/anxious.     PHYSICAL EXAMINATION:  There were no vitals taken for this visit.  ECOG PERFORMANCE STATUS: {CHL ONC ECOG [id number]  Physical Exam  Constitutional: Oriented to person, place, and time and well-developed, well-nourished, and in no distress. No distress.  HENT:  Head: Normocephalic and atraumatic.  Mouth/Throat: Oropharynx is clear and moist. No oropharyngeal exudate.  Eyes: Conjunctivae are normal. Right eye exhibits no discharge. Left eye exhibits no discharge. No scleral icterus.  Neck: Normal range of motion. Neck supple.  Cardiovascular: Normal rate, regular rhythm, normal heart sounds and intact distal pulses.   Pulmonary/Chest: Effort normal and breath sounds normal. No respiratory distress. No wheezes. No rales.  Abdominal: Soft. Bowel sounds are normal. Exhibits no distension and no mass. There is no tenderness.  Musculoskeletal: Normal range of motion. Exhibits no edema.  Lymphadenopathy:    No cervical adenopathy.  Neurological: Alert and oriented to person, place, and time. Exhibits normal muscle tone. Gait normal. Coordination normal.  Skin:  Skin is warm and dry. No rash noted. Not diaphoretic. No erythema. No pallor.  Psychiatric: Mood, memory and judgment normal.  Vitals reviewed.  LABORATORY DATA: Lab Results  Component Value Date   WBC 6.7 08/01/2023   HGB 8.7 (L) 08/01/2023   HCT 28.3 (L) 08/01/2023   MCV 80.4 08/01/2023   PLT 177 08/01/2023      Chemistry      Component Value Date/Time   NA 133 (L) 08/01/2023 1110   NA 140 05/27/2022 1151   K 3.9 08/01/2023 1110   CL 100 08/01/2023 1110   CO2 21 (L) 08/01/2023 1110   BUN 8 08/01/2023 1110   BUN 17 05/27/2022 1151   CREATININE 1.07 (H) 08/01/2023 1110   CREATININE 1.02 (H) 10/27/2022 1106      Component Value Date/Time   CALCIUM  9.7 08/01/2023 1110   ALKPHOS 55 10/17/2022 1650   AST 13 10/27/2022 1106   ALT 10 10/27/2022 1106   BILITOT 0.5 10/27/2022 1106   BILITOT 0.3 03/15/2022 1351       RADIOGRAPHIC STUDIES: MR BRAIN W WO CONTRAST Result Date: 08/19/2023 CLINICAL DATA:  Provided history: Malignant neoplasm of unspecified part of unspecified bronchus or lung. Non-small cell lung cancer, staging. EXAM: MRI HEAD WITHOUT AND WITH CONTRAST TECHNIQUE: Multiplanar, multiecho pulse sequences of the brain and surrounding structures were obtained without and with intravenous contrast. CONTRAST:  10mL GADAVIST  GADOBUTROL  1 MMOL/ML IV SOLN COMPARISON:  Head CT 03/17/2022. FINDINGS: Brain: No age-advanced or lobar predominant cerebral atrophy. Multifocal T2 FLAIR hyperintense signal abnormality within the cerebral white matter, nonspecific but compatible with mild chronic small vessel ischemic disease. 7 mm bilobed T2 hyperintense and non-enhancing or hypoenhancing focus within the posterior aspect of the sella (for instance as seen on series 15, image 21) (series 18, image 15). No cortical encephalomalacia is identified. There is no acute infarct. No chronic intracranial blood products. No extra-axial fluid collection. No midline shift. No pathologic parenchymal  enhancement identified. Vascular: Maintained flow voids within the proximal large arterial vessels. 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 Skull and upper cervical spine: No  focal worrisome marrow lesion. Incompletely assessed cervical spondylosis. Sinuses/Orbits: No mass or acute finding within the imaged orbits. No significant paranasal sinus disease. Other: Trace fluid within bilateral mastoid air cells. Impression #2 will be called to the ordering clinician or representative by the Radiologist Assistant, and communication documented in the PACS or [employer]. IMPRESSION: 1. No evidence of intracranial metastatic disease. 2. 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 No further imaging evaluation or imaging follow-up is necessary for this finding. Consider endocrine function tests and correlate for a history of pituitary hypersecretion. This follows ACR consensus guidelines: Management of Incidental Pituitary Findings on CT, MRI and F18-FDG PET: A White Paper of the ACR Incidental Findings Committee. J Am Coll Radiol 2018; 15: 966-72. 3. Mild chronic small vessel ischemic changes within the cerebral white matter. Electronically Signed   By: Rockey Childs D.O.   On: 08/19/2023 09:22   DG Abd 1 View Result Date: 08/18/2023 CLINICAL DATA:  Non-small cell lung cancer (NSCLC), staging foreign body/ MRI clearance EXAM: ABDOMEN - 1 VIEW COMPARISON:  Jun 14, 2023 FINDINGS: Nonobstructive bowel gas pattern. No pneumoperitoneum. No organomegaly or radiopaque calculi. No acute fracture or destructive lesion. The lung bases are clear.Multilevel degenerative disc disease of the spine. Bilateral hip osteoarthritis. Aortoiliac atherosclerosis IMPRESSION: Nonobstructive bowel gas pattern.  No radiopaque foreign body. Electronically Signed   By: Rogelia Myers M.D.   On: 08/18/2023 10:19   DG Chest 2  View Result Date: 08/03/2023 CLINICAL DATA:  Shortness of breath after lung biopsy EXAM: CHEST - 2 VIEW COMPARISON:  August 01, 2023 FINDINGS: Increasing opacity in left mid lung compared to prior, may represent sequelae of prior lung biopsy/pulmonary hemorrhage. No pneumothorax. No pleural effusion. Unchanged cardiomediastinal silhouette. No acute osseous findings. IMPRESSION: Increasing left midlung opacity, likely representing sequelae of recent lung biopsy. No radiographically evident pneumothorax. Electronically Signed   By: Michaeline Blanch M.D.   On: 08/03/2023 11:03   DG Chest Port 1 View Result Date: 08/01/2023 CLINICAL DATA:  Bronchoscopy with biopsy EXAM: PORTABLE CHEST 1 VIEW COMPARISON:  CT of the chest performed May 20, 2023 FINDINGS: Similar appearance of left-sided lung lesion. Mild diffuse interstitial prominence. Heart mediastinum are not significantly changed. No pneumothorax or significant pleural effusion. IMPRESSION: 1. No pneumothorax. 2. Similar appearance of left-sided lung disease. Electronically Signed   By: Maude Naegeli M.D.   On: 08/01/2023 14:17   DG C-ARM BRONCHOSCOPY Result Date: 08/01/2023 C-ARM BRONCHOSCOPY: Fluoroscopy was utilized by the requesting physician.  No radiographic interpretation.   DG Chest 2 View Result Date: 07/29/2023 CLINICAL DATA:  Productive cough.  Left lung mass. EXAM: CHEST - 2 VIEW COMPARISON:  05/13/2023. FINDINGS: Unremarkable cardiac silhouette. There is pulmonary vascular congestion. Alveolar opacity left mid lung consistent with known mass. Compared to the previous examination the lesion appears slightly less dense. No pneumothorax or pleural effusion. Calcified aorta. Thoracic degenerative changes. IMPRESSION: Left mid lung mass appears less dense than on the prior study which might be a treatment effect. Examination otherwise unchanged. Electronically Signed   By: Fonda Field M.D.   On: 07/29/2023 22:08    ASSESSMENT: This is a very  pleasant 78 year old Caucasian female diagnosed with stage *** (T3, N0, M0) non-small cell lung cancer, adenocarcinoma.  The patient presented with a large left lower lobe lesion.  The patient was diagnosed in July 2025.  The patient's case was discussed at the multidisciplinary conference on  08/07/2023.  The patient is scheduled for PFTs on ***and has a consultation with surgery on ***  The patient was seen with Dr. Sherrod today. ***Molecular studies?  ***Or chemo radiation?  If the patient is found to be a surgical candidate, and Dr. Sherrod recommend 3 cycles of neoadjuvant chemotherapy and immunotherapy with carboplatin for AUC of 5, Alimta 500 mg/m, and nivolumab IV every 3 weeks for 3 cycles.  The patient is interested in this option and we will tentatively arrange for her first cycle of treatment on 8/***/25   We discussed the adverse side effects of treatment including but not limited to alopecia, myelosuppression, nausea and vomiting, peripheral neuropathy, liver or renal dysfunction as well as immunotherapy mediated adverse effects.   I will arrange for the patient to have a chemoeducation class prior to receiving her first cycle of chemotherapy.   We will arrange for the patient to have a B12 injection while in the clinic today.     I sent prescriptions for 1 mg folic acid  p.o. daily as well as Compazine 10 mg every 6 hours as needed for nausea.   The patient will follow-up in 2 weeks for a one-week follow-up visit after completing her first cycle of chemotherapy.  The patient was advised to call immediately if she has any concerning symptoms in the interval. The patient voices understanding of current disease status and treatment options and is in agreement with the current care plan. All questions were answered. The patient knows to call the clinic with any problems, questions or concerns. We can certainly see the patient much sooner if necessary   The patient voices  understanding of current disease status and treatment options and is in agreement with the current care plan.  All questions were answered. The patient knows to call the clinic with any problems, questions or concerns. We can certainly see the patient much sooner if necessary.  Thank you so much for allowing me to participate in the care of DARON BREEDING. I will continue to follow up the patient with you and assist in her care.  I spent {CHL ONC TIME VISIT - DTPQU:[id number]} counseling the patient face to face. The total time spent in the appointment was {CHL ONC TIME VISIT - DTPQU:[id number]}.  Disclaimer: This note was dictated with voice recognition software. Similar sounding words can inadvertently be transcribed and may not be corrected upon review.   Venezia Sargeant L Samanthamarie Ezzell August 19, 2023, 4:04 PM

## 2023-08-19 NOTE — Telephone Encounter (Signed)
 Call Report MRI Brain  IMPRESSION: 1. No evidence of intracranial metastatic disease. 2. 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 No further imaging evaluation or imaging follow-up is necessary for this finding. Consider endocrine function tests and correlate for a history of pituitary hypersecretion. This follows ACR consensus guidelines: Management of Incidental Pituitary Findings on CT, MRI and F18-FDG PET: A White Paper of the ACR Incidental Findings Committee. J Am Coll Radiol 2018; 15: 966-72. 3. Mild chronic small vessel ischemic changes within the cerebral white matter.

## 2023-08-19 NOTE — Telephone Encounter (Signed)
 You are seeing Ms. Tamara [name] 8/5. MRI brain was negative for  metastatic disease. Thanks so much for seeing her.

## 2023-08-19 NOTE — Telephone Encounter (Signed)
 I have sent a message to [place] who will see patient 8/5. Thanks so much

## 2023-08-19 NOTE — Telephone Encounter (Signed)
 Copied from CRM [id number]. Topic: Clinical - Prescription Issue >> Aug 19, 2023  3:29 PM Travis FALCON wrote: [facility] is calling in because patient's Baclofen  5 MG TABS [[id number]] was sent in to [facility] and needs to go to [employer]. They are requesting it be sent over.

## 2023-08-20 ENCOUNTER — Other Ambulatory Visit: Payer: Self-pay | Admitting: Nurse Practitioner

## 2023-08-20 ENCOUNTER — Other Ambulatory Visit: Payer: Self-pay | Admitting: Internal Medicine

## 2023-08-20 DIAGNOSIS — R600 Localized edema: Secondary | ICD-10-CM

## 2023-08-22 ENCOUNTER — Other Ambulatory Visit: Payer: Self-pay | Admitting: Emergency Medicine

## 2023-08-22 ENCOUNTER — Other Ambulatory Visit: Payer: Self-pay | Admitting: Internal Medicine

## 2023-08-22 ENCOUNTER — Other Ambulatory Visit: Payer: Self-pay | Admitting: Physician Assistant

## 2023-08-22 DIAGNOSIS — M545 Low back pain, unspecified: Secondary | ICD-10-CM

## 2023-08-22 DIAGNOSIS — R9389 Abnormal findings on diagnostic imaging of other specified body structures: Secondary | ICD-10-CM

## 2023-08-22 MED ORDER — BACLOFEN 5 MG PO TABS: 5.0000 mg | ORAL_TABLET | Freq: Every evening | ORAL | 1 refills | Status: AC | PRN

## 2023-08-22 NOTE — Telephone Encounter (Signed)
 Order sent for refill and change quantity if appropriate

## 2023-08-22 NOTE — Telephone Encounter (Signed)
 Requested Prescriptions  Pending Prescriptions Disp Refills   valsartan  (DIOVAN ) 80 MG tablet [Pharmacy Med Name: VALSARTAN  80 MG Oral Tablet] 90 tablet 0    Sig: TAKE 1 TABLET EVERY DAY     Cardiovascular:  Angiotensin Receptor Blockers Failed - 08/22/2023  2:47 PM      Failed - Cr in normal range and within 180 days    Creat  Date Value Ref Range Status  10/27/2022 1.02 (H) 0.60 - 1.00 mg/dL Final   Creatinine, Ser  Date Value Ref Range Status  08/01/2023 1.07 (H) 0.44 - 1.00 mg/dL Final   Creatinine, Urine  Date Value Ref Range Status  12/23/2022 180 20 - 275 mg/dL Final         Passed - K in normal range and within 180 days    Potassium  Date Value Ref Range Status  08/01/2023 3.9 3.5 - 5.1 mmol/L Final         Passed - Patient is not pregnant      Passed - Last BP in normal range    BP Readings from Last 1 Encounters:  08/15/23 130/82         Passed - Valid encounter within last 6 months    Recent Outpatient Visits           1 week ago 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   [facility] Bernardo Fend, DO   2 weeks ago Acute bilateral low back pain without sciatica   [facility] Gareth Mliss FALCON, [place]

## 2023-08-22 NOTE — Telephone Encounter (Signed)
 Requested Prescriptions  Pending Prescriptions Disp Refills   Accu-Chek Softclix Lancets lancets [Pharmacy Med Name: ACCU-CHEK SOFTCLIX LANCETS] 200 each 3    Sig: TEST BLOOD SUGAR TWO TIMES DAILY     Endocrinology: Diabetes - Testing Supplies Passed - 08/22/2023  2:48 PM      Passed - Valid encounter within last 12 months    Recent Outpatient Visits           1 week ago 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   [facility] Bernardo Fend, DO   2 weeks ago Acute bilateral low back pain without sciatica   [facility] Gareth Mliss FALCON, FNP               furosemide  (LASIX ) 40 MG tablet [Pharmacy Med Name: FUROSEMIDE  40 MG Oral Tablet] 90 tablet 0    Sig: TAKE 1 TABLET EVERY DAY     Cardiovascular:  Diuretics - Loop Failed - 08/22/2023  2:48 PM      Failed - Na in normal range and within 180 days    Sodium  Date Value Ref Range Status  08/01/2023 133 (L) 135 - 145 mmol/L Final  05/27/2022 140 134 - 144 mmol/L Final         Failed - Cr in normal range and within 180 days    Creat  Date Value Ref Range Status  10/27/2022 1.02 (H) 0.60 - 1.00 mg/dL Final   Creatinine, Ser  Date Value Ref Range Status  08/01/2023 1.07 (H) 0.44 - 1.00 mg/dL Final   Creatinine, Urine  Date Value Ref Range Status  12/23/2022 180 20 - 275 mg/dL Final         Failed - Mg Level in normal range and within 180 days    Magnesium   Date Value Ref Range Status  10/27/2022 1.2 (L) 1.5 - 2.5 mg/dL Final         Passed - K in normal range and within 180 days    Potassium  Date Value Ref Range Status  08/01/2023 3.9 3.5 - 5.1 mmol/L Final         Passed - Ca in normal range and within 180 days    Calcium   Date Value Ref Range Status  08/01/2023 9.7 8.9 - 10.3 mg/dL Final         Passed - Cl in normal range and within 180 days    Chloride  Date Value Ref Range Status  08/01/2023 100 98 - 111 mmol/L Final         Passed - Last BP in  normal range    BP Readings from Last 1 Encounters:  08/15/23 130/82         Passed - Valid encounter within last 6 months    Recent Outpatient Visits           1 week ago 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   [facility] Bernardo Fend, DO   2 weeks ago Acute bilateral low back pain without sciatica   [facility] Gareth Mliss FALCON, FNP

## 2023-08-23 ENCOUNTER — Inpatient Hospital Stay: Admitting: Physician Assistant

## 2023-08-23 ENCOUNTER — Inpatient Hospital Stay: Attending: Internal Medicine

## 2023-08-23 VITALS — BP 152/63 | HR 99 | Temp 97.9°F | Resp 16 | Wt 221.4 lb

## 2023-08-23 DIAGNOSIS — I2510 Atherosclerotic heart disease of native coronary artery without angina pectoris: Secondary | ICD-10-CM | POA: Insufficient documentation

## 2023-08-23 DIAGNOSIS — I708 Atherosclerosis of other arteries: Secondary | ICD-10-CM

## 2023-08-23 DIAGNOSIS — Z860100 Personal history of colon polyps, unspecified: Secondary | ICD-10-CM

## 2023-08-23 DIAGNOSIS — C3432 Malignant neoplasm of lower lobe, left bronchus or lung: Secondary | ICD-10-CM | POA: Insufficient documentation

## 2023-08-23 DIAGNOSIS — Z8719 Personal history of other diseases of the digestive system: Secondary | ICD-10-CM

## 2023-08-23 DIAGNOSIS — Z7982 Long term (current) use of aspirin: Secondary | ICD-10-CM | POA: Insufficient documentation

## 2023-08-23 DIAGNOSIS — Q283 Other malformations of cerebral vessels: Secondary | ICD-10-CM | POA: Diagnosis not present

## 2023-08-23 DIAGNOSIS — K7460 Unspecified cirrhosis of liver: Secondary | ICD-10-CM

## 2023-08-23 DIAGNOSIS — K219 Gastro-esophageal reflux disease without esophagitis: Secondary | ICD-10-CM | POA: Insufficient documentation

## 2023-08-23 DIAGNOSIS — J449 Chronic obstructive pulmonary disease, unspecified: Secondary | ICD-10-CM | POA: Insufficient documentation

## 2023-08-23 DIAGNOSIS — M47814 Spondylosis without myelopathy or radiculopathy, thoracic region: Secondary | ICD-10-CM | POA: Insufficient documentation

## 2023-08-23 DIAGNOSIS — E119 Type 2 diabetes mellitus without complications: Secondary | ICD-10-CM

## 2023-08-23 DIAGNOSIS — Z880 Allergy status to penicillin: Secondary | ICD-10-CM | POA: Insufficient documentation

## 2023-08-23 DIAGNOSIS — M47812 Spondylosis without myelopathy or radiculopathy, cervical region: Secondary | ICD-10-CM

## 2023-08-23 DIAGNOSIS — I6782 Cerebral ischemia: Secondary | ICD-10-CM | POA: Diagnosis not present

## 2023-08-23 DIAGNOSIS — D649 Anemia, unspecified: Secondary | ICD-10-CM | POA: Insufficient documentation

## 2023-08-23 DIAGNOSIS — R0789 Other chest pain: Secondary | ICD-10-CM

## 2023-08-23 DIAGNOSIS — Z881 Allergy status to other antibiotic agents status: Secondary | ICD-10-CM | POA: Insufficient documentation

## 2023-08-23 DIAGNOSIS — R9389 Abnormal findings on diagnostic imaging of other specified body structures: Secondary | ICD-10-CM

## 2023-08-23 DIAGNOSIS — I700 Atherosclerosis of aorta: Secondary | ICD-10-CM

## 2023-08-23 DIAGNOSIS — E785 Hyperlipidemia, unspecified: Secondary | ICD-10-CM | POA: Diagnosis not present

## 2023-08-23 DIAGNOSIS — Z79899 Other long term (current) drug therapy: Secondary | ICD-10-CM | POA: Diagnosis not present

## 2023-08-23 DIAGNOSIS — R0602 Shortness of breath: Secondary | ICD-10-CM

## 2023-08-23 DIAGNOSIS — K5900 Constipation, unspecified: Secondary | ICD-10-CM | POA: Insufficient documentation

## 2023-08-23 DIAGNOSIS — Z833 Family history of diabetes mellitus: Secondary | ICD-10-CM

## 2023-08-23 DIAGNOSIS — Z87891 Personal history of nicotine dependence: Secondary | ICD-10-CM | POA: Diagnosis not present

## 2023-08-23 DIAGNOSIS — C3490 Malignant neoplasm of unspecified part of unspecified bronchus or lung: Secondary | ICD-10-CM | POA: Insufficient documentation

## 2023-08-23 DIAGNOSIS — R0609 Other forms of dyspnea: Secondary | ICD-10-CM | POA: Insufficient documentation

## 2023-08-23 DIAGNOSIS — D509 Iron deficiency anemia, unspecified: Secondary | ICD-10-CM | POA: Insufficient documentation

## 2023-08-23 DIAGNOSIS — I10 Essential (primary) hypertension: Secondary | ICD-10-CM | POA: Diagnosis not present

## 2023-08-23 LAB — CBC WITH DIFFERENTIAL (CANCER CENTER ONLY)
Abs Immature Granulocytes: 0.02 K/uL (ref 0.00–0.07)
Basophils Absolute: 0 K/uL (ref 0.0–0.1)
Basophils Relative: 1 %
Eosinophils Absolute: 0.5 K/uL (ref 0.0–0.5)
Eosinophils Relative: 7 %
HCT: 26.3 % — ABNORMAL LOW (ref 36.0–46.0)
Hemoglobin: 8.3 g/dL — ABNORMAL LOW (ref 12.0–15.0)
Immature Granulocytes: 0 %
Lymphocytes Relative: 21 %
Lymphs Abs: 1.5 K/uL (ref 0.7–4.0)
MCH: 23.9 pg — ABNORMAL LOW (ref 26.0–34.0)
MCHC: 31.6 g/dL (ref 30.0–36.0)
MCV: 75.8 fL — ABNORMAL LOW (ref 80.0–100.0)
Monocytes Absolute: 0.4 K/uL (ref 0.1–1.0)
Monocytes Relative: 6 %
Neutro Abs: 4.7 K/uL (ref 1.7–7.7)
Neutrophils Relative %: 65 %
Platelet Count: 206 K/uL (ref 150–400)
RBC: 3.47 MIL/uL — ABNORMAL LOW (ref 3.87–5.11)
RDW: 15.4 % (ref 11.5–15.5)
WBC Count: 7.3 K/uL (ref 4.0–10.5)
nRBC: 0 % (ref 0.0–0.2)

## 2023-08-23 LAB — IRON AND IRON BINDING CAPACITY (CC-WL,HP ONLY)
Iron: 34 ug/dL (ref 28–170)
Saturation Ratios: 7 % — ABNORMAL LOW (ref 10.4–31.8)
TIBC: 480 ug/dL — ABNORMAL HIGH (ref 250–450)
UIBC: 446 ug/dL — ABNORMAL HIGH (ref 148–442)

## 2023-08-23 LAB — CMP (CANCER CENTER ONLY)
ALT: 9 U/L (ref 0–44)
AST: 16 U/L (ref 15–41)
Albumin: 4.2 g/dL (ref 3.5–5.0)
Alkaline Phosphatase: 50 U/L (ref 38–126)
Anion gap: 10 (ref 5–15)
BUN: 23 mg/dL (ref 8–23)
CO2: 22 mmol/L (ref 22–32)
Calcium: 10.1 mg/dL (ref 8.9–10.3)
Chloride: 105 mmol/L (ref 98–111)
Creatinine: 1.39 mg/dL — ABNORMAL HIGH (ref 0.44–1.00)
GFR, Estimated: 39 mL/min — ABNORMAL LOW (ref >60)
Glucose, Bld: 116 mg/dL — ABNORMAL HIGH (ref 70–99)
Potassium: 4.6 mmol/L (ref 3.5–5.1)
Sodium: 137 mmol/L (ref 135–145)
Total Bilirubin: 0.3 mg/dL (ref 0.0–1.2)
Total Protein: 7.6 g/dL (ref 6.5–8.1)

## 2023-08-23 LAB — SAMPLE TO BLOOD BANK

## 2023-08-23 LAB — FERRITIN: Ferritin: 16 ng/mL (ref 11–307)

## 2023-08-23 NOTE — Progress Notes (Incomplete)
 Location of tumor and Histology per Pathology Report: Left lower lobe  Biopsy:    Past/Anticipated interventions by surgeon, if any: left    Past/Anticipated interventions by medical oncology, if any:      Pain issues, if any:  {:18581} {PAIN DESCRIPTION:21022940}  SAFETY ISSUES: Prior radiation? {:18581} Pacemaker/ICD? {:18581} Possible current pregnancy? no Is the patient on methotrexate? {:18581}  Current Complaints / other details:  ***     ***

## 2023-08-23 NOTE — Progress Notes (Signed)
 Radiation Oncology         [phone] ________________________________  Initial {In/Out}patient Consultation  Name: Tamara [name] MRN: [id number]  Date: 08/25/2023  DOB: [birth year]  CC:Bernardo Fend, DO  Byrum, Lamar RAMAN, MD   REFERRING PHYSICIAN: Shelah Lamar RAMAN, MD  DIAGNOSIS: There were no encounter diagnoses.  Lung cancer (HCC) Stage IIIA (cT4, cN0, cM0)  HISTORY OF PRESENT ILLNESS::Tamara [name] is a 78 y.o. female who is accompanied by ***. she is seen as a courtesy of Dr. Byrum for an opinion concerning radiation therapy as part of management for her recently diagnosed lung cancer. Patient has a distant smoking history, averaging 1-1.5 ppd, reports quitting in 2006.   The patient is followed by Dr. Shelah since August if 2024 for recurrent pneumonia despite several rounds of antibiotics. A left lower lobe mass was first  identified in September of 2024 where an area of left lower lobe rounded atelectasis was noted. She underwent a bronchoscopy was was  reassuring and PET scans that did not show any hypermetabolic activity in the left lower lobe opacity.    She underwent a CT scan on 05/20/23 showing a predominantly solid large indistinct left lower lobe lung mass measuring 7.7 x 5.9 x 11.8 cm. similar in size and appearance with some internal groundglass and cystic change. Moderate patchy. Bronchovascular and peripheral reticulation and groundglass opacity bilaterally. PET scan done on 06/14/23 showed that the chronic airspace filling process involving much of the left lower lobe is mildly increased in size and mildly increased in metabolic activity. The upper component is hypermetabolic maximum SUV 6.0 (formerly 4.5) and the lower component is less hypermetabolic with maximum SUV of 3.4 (formerly 3.3).       During a follow up with Dr. Shelah on 06/29/23, he recommended undergoing a bronchoscopy in light of recent PET scan results. Subsequently, she proceeded with a  bronchoscopy with biopsy using electromagnetic navigation on 08/01/23. 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     For staging purposes, she underwent a brain MRI on 08/18/23 showing no evidence of intracranial metastatic disease. Scan did however note a  7 mm lesion within the posterior aspect of the sella, which could reflect a cyst or a pituitary adenoma.  Patient's case was presented to the tumor board on 08/11/23 and the recommendation was obtaining PFTs and discuss possible surgery with patient with neoadjuvant chemotherapy for 3 cycles and then evaluate for possible surgery or radiation.   During her most recent follow up with PA Heilingoetter on 08/23/23, patient stated that she is not interested  in chemotherapy, radiation, or surgery. She is concerned about the side effect profile of chemotherapy. She is concerned about adverse side effects of radiation burns. She also is concerned about surgery causing seeding of the tumor. She wold like to further investigate her options.   The patient underwent a biopsy on {date} showing: ***  PREVIOUS RADIATION THERAPY: {EXAM; YES/NO:19492::No}  PAST MEDICAL HISTORY:  Past Medical History:  Diagnosis Date   Anemia    Arthritis    knee and shoulders   Colon polyps 09/05/2017   COPD (chronic obstructive pulmonary disease) (HCC)    Diabetes mellitus without complication (HCC)    type II   Dyspnea    05/16/20 has had a cough for 2.5 years post Covid   Gastritis 09/05/2017   GERD (gastroesophageal reflux disease)    Hyperlipidemia    Hypertension    patient denies   Hypothyroidism  Panic attack    Pneumonia    RUQ pain 03/09/2017   Per patient, she has had RUQ pain 4-5 years that has grown worse in the last few months.    PAST SURGICAL HISTORY: Past Surgical History:  Procedure Laterality Date   BREAST BIOPSY Left 2008   CORE W/CLIP - NEG   BRONCHIAL BIOPSY  05/19/2020   Procedure: BRONCHIAL  BIOPSIES;  Surgeon: Shelah Lamar RAMAN, MD;  Location: [facility] ENDOSCOPY;  Service: Pulmonary;;   BRONCHIAL BIOPSY  08/01/2023   Procedure: BRONCHOSCOPY, WITH BIOPSY;  Surgeon: Shelah Lamar RAMAN, MD;  Location: [facility] ENDOSCOPY;  Service: Pulmonary;;   BRONCHIAL BRUSHINGS  05/19/2020   Procedure: BRONCHIAL BRUSHINGS;  Surgeon: Shelah Lamar RAMAN, MD;  Location: [facility] ENDOSCOPY;  Service: Pulmonary;;   BRONCHIAL BRUSHINGS  08/01/2023   Procedure: BRONCHOSCOPY, WITH BRUSH BIOPSY;  Surgeon: Shelah Lamar RAMAN, MD;  Location: [facility] ENDOSCOPY;  Service: Pulmonary;;   BRONCHIAL NEEDLE ASPIRATION BIOPSY  05/19/2020   Procedure: BRONCHIAL NEEDLE ASPIRATION BIOPSIES;  Surgeon: Shelah Lamar RAMAN, MD;  Location: [facility] ENDOSCOPY;  Service: Pulmonary;;   BRONCHIAL NEEDLE ASPIRATION BIOPSY  08/01/2023   Procedure: BRONCHOSCOPY, WITH NEEDLE ASPIRATION BIOPSY;  Surgeon: Shelah Lamar RAMAN, MD;  Location: [facility] ENDOSCOPY;  Service: Pulmonary;;   BRONCHIAL WASHINGS  05/19/2020   Procedure: BRONCHIAL WASHINGS;  Surgeon: Shelah Lamar RAMAN, MD;  Location: [facility] ENDOSCOPY;  Service: Pulmonary;;   BRONCHIAL WASHINGS  08/01/2023   Procedure: IRRIGATION, BRONCHUS;  Surgeon: Shelah Lamar RAMAN, MD;  Location: [facility] ENDOSCOPY;  Service: Pulmonary;;   BRONCHOSCOPY, WITH BIOPSY USING ELECTROMAGNETIC NAVIGATION Left 08/01/2023   Procedure: BRONCHOSCOPY, WITH BIOPSY USING ELECTROMAGNETIC NAVIGATION;  Surgeon: Shelah Lamar RAMAN, MD;  Location: [facility] ENDOSCOPY;  Service: Pulmonary;  Laterality: Left;   COLONOSCOPY WITH PROPOFOL  N/A 02/08/2017   Procedure: COLONOSCOPY WITH PROPOFOL ;  Surgeon: Gaylyn Gladis PENNER, MD;  Location: [facility] ENDOSCOPY;  Service: Endoscopy;  Laterality: N/A;   COLONOSCOPY WITH PROPOFOL  N/A 05/05/2022   Procedure: COLONOSCOPY WITH PROPOFOL ;  Surgeon: Unk Corinn Skiff, MD;  Location[facility] ENDOSCOPY;  Service: Gastroenterology;  Laterality: N/A;   ESOPHAGOGASTRODUODENOSCOPY (EGD) WITH PROPOFOL  N/A 02/08/2017   Procedure: ESOPHAGOGASTRODUODENOSCOPY (EGD)  WITH PROPOFOL ;  Surgeon: Gaylyn Gladis PENNER, MD;  Location: [facility] ENDOSCOPY;  Service: Endoscopy;  Laterality: N/A;   TUBAL LIGATION     VIDEO BRONCHOSCOPY WITH ENDOBRONCHIAL NAVIGATION N/A 05/19/2020   Procedure: VIDEO BRONCHOSCOPY WITH ENDOBRONCHIAL NAVIGATION;  Surgeon: Shelah Lamar RAMAN, MD;  Location: [facility] ENDOSCOPY;  Service: Pulmonary;  Laterality: N/A;    FAMILY HISTORY:  Family History  Problem Relation Age of Onset   Diabetes Maternal Grandmother    Breast cancer Neg Hx     SOCIAL HISTORY:  Social History   Tobacco Use   Smoking status: Former    Current packs/day: 0.00    Average packs/day: 1 pack/day for 40.0 years (40.0 ttl pk-yrs)    Types: Cigarettes    Start date: 89    Quit date: 2006    Years since quitting: 19.6   Smokeless tobacco: Never   Tobacco comments:    Smoking cessation materials not required  Vaping Use   Vaping status: Never Used  Substance Use Topics   Alcohol use: No   Drug use: No    ALLERGIES:  Allergies  Allergen Reactions   Penicillins Hives    Tolerated rocephin  09/2022 Reaction: 1965    MEDICATIONS:  Current Outpatient Medications  Medication Sig Dispense Refill   Accu-Chek Softclix Lancets lancets TEST BLOOD SUGAR TWO TIMES DAILY 200  each 3   albuterol  (PROVENTIL ) (2.5 MG/3ML) 0.083% nebulizer solution Take 3 mLs (2.5 mg total) by nebulization every 6 (six) hours as needed for wheezing or shortness of breath. 180 mL 5   albuterol  (VENTOLIN  HFA) 108 (90 Base) MCG/ACT inhaler Inhale 2 puffs into the lungs every 6 (six) hours as needed for wheezing or shortness of breath. 8 g 6   Alcohol Swabs (DROPSAFE ALCOHOL PREP) 70 % PADS USE AS DIRECTED TWO TIMES DAILY WHEN TESTING BLOOD SUGAR 200 each 3   Ascorbic Acid  (VITAMIN C ) 1000 MG tablet Take 1,000 mg by mouth daily.     aspirin  EC 81 MG tablet Take 1 tablet (81 mg total) by mouth daily. Swallow whole. 90 tablet 3   Baclofen  5 MG TABS Take 1 tablet (5 mg total) by mouth at bedtime as  needed (msk pain or spasms). 90 tablet 1   benzonatate  (TESSALON ) 200 MG capsule Take 1 capsule (200 mg total) by mouth 3 (three) times daily as needed. 45 capsule 1   Blood Glucose Monitoring Suppl (TRUE METRIX AIR GLUCOSE METER) w/Device KIT USE AS DIRECTED 1 kit 3   Cholecalciferol  (VITAMIN D ) 50 MCG (2000 UT) CAPS Take 1,000 Units by mouth daily.     DROPLET PEN NEEDLES 32G X 4 MM MISC USE AS DIRECTED 200 each 3   ezetimibe  (ZETIA ) 10 MG tablet TAKE 1 TABLET EVERY DAY 90 tablet 1   Ferrous Fumarate (HEMOCYTE - 106 MG FE) 324 (106 Fe) MG TABS tablet Take 1 tablet by mouth daily.     Ferrous Fumarate-DSS (FE CAPS/STOOL SOFTENER) 50-100 MG TABS Take 50-100 mg by mouth daily. 30 tablet 3   furosemide  (LASIX ) 40 MG tablet TAKE 1 TABLET EVERY DAY 90 tablet 0   hydrochlorothiazide  (MICROZIDE ) 12.5 MG capsule Take 1 capsule (12.5 mg total) by mouth daily. 90 capsule 1   Insulin  NPH, Human,, Isophane, (NOVOLIN  N FLEXPEN) 100 UNIT/ML Kiwkpen INJECT 40 UNITS UNDER THE SKIN IN THE MORNING AND 30 TO 30 UNITS AT BEDTIME 75 mL 3   levothyroxine  (SYNTHROID ) 50 MCG tablet TAKE 1 TABLET EVERY DAY BEFORE BREAKFAST 90 tablet 3   metFORMIN  (GLUCOPHAGE ) 1000 MG tablet TAKE 1 TABLET TWICE DAILY 180 tablet 3   metoCLOPramide  (REGLAN ) 10 MG tablet Take 1 tablet (10 mg total) by mouth every 6 (six) hours as needed. 30 tablet 0   nystatin  (MYCOSTATIN /NYSTOP ) powder Apply 1 Application topically daily as needed (yeast under belly). 60 g 2   pantoprazole  (PROTONIX ) 40 MG tablet TAKE 1 TABLET TWICE DAILY 180 tablet 3   potassium chloride  (KLOR-CON  M) 10 MEQ tablet Take 1 tablet (10 mEq total) by mouth daily. 90 tablet 2   rosuvastatin  (CRESTOR ) 20 MG tablet Take 1 tablet (20 mg total) by mouth at bedtime. 90 tablet 1   sucralfate  (CARAFATE ) 1 g tablet Take 1 tablet (1 g total) by mouth 4 (four) times daily. 120 tablet 1   sulfamethoxazole -trimethoprim  (BACTRIM  DS) 800-160 MG tablet Take 1 tablet by mouth 2 (two) times  daily. 14 tablet 0   sulfamethoxazole -trimethoprim  (BACTRIM  DS) 800-160 MG tablet Take 1 tablet by mouth 2 (two) times daily. 14 tablet 0   Tiotropium Bromide -Olodaterol (STIOLTO RESPIMAT ) 2.5-2.5 MCG/ACT AERS Inhale 2 puffs into the lungs daily. 4 g 11   traMADol  (ULTRAM ) 50 MG tablet Take 1 tablet (50 mg total) by mouth daily as needed for severe pain (pain score 7-10). 30 tablet 2   TRUE METRIX BLOOD GLUCOSE TEST test strip Use  2x a day 200 each 3   valsartan  (DIOVAN ) 80 MG tablet TAKE 1 TABLET EVERY DAY 90 tablet 0   No current facility-administered medications for this encounter.    REVIEW OF SYSTEMS:  A 10+ POINT REVIEW OF SYSTEMS WAS OBTAINED including neurology, dermatology, psychiatry, cardiac, respiratory, lymph, extremities, GI, GU, musculoskeletal, constitutional, reproductive, HEENT. ***   PHYSICAL EXAM:  vitals were not taken for this visit.   General: Alert and oriented, in no acute distress HEENT: Head is normocephalic. Extraocular movements are intact. Oropharynx is clear. Neck: Neck is supple, no palpable cervical or supraclavicular lymphadenopathy. Heart: Regular in rate and rhythm with no murmurs, rubs, or gallops. Chest: Clear to auscultation bilaterally, with no rhonchi, wheezes, or rales. Abdomen: Soft, nontender, nondistended, with no rigidity or guarding. Extremities: No cyanosis or edema. Lymphatics: see Neck Exam Skin: No concerning lesions. Musculoskeletal: symmetric strength and muscle tone throughout. Neurologic: Cranial nerves II through XII are grossly intact. No obvious focalities. Speech is fluent. Coordination is intact. Psychiatric: Judgment and insight are intact. Affect is appropriate. ***  ECOG = ***  0 - Asymptomatic (Fully active, able to carry on all predisease activities without restriction)  1 - Symptomatic but completely ambulatory (Restricted in physically strenuous activity but ambulatory and able to carry out work of a light or sedentary  nature. For example, light housework, office work)  2 - Symptomatic, <50% in bed during the day (Ambulatory and capable of all self care but unable to carry out any work activities. Up and about more than 50% of waking hours)  3 - Symptomatic, >50% in bed, but not bedbound (Capable of only limited self-care, confined to bed or chair 50% or more of waking hours)  4 - Bedbound (Completely disabled. Cannot carry on any self-care. Totally confined to bed or chair)  5 - Death   Raylene MM, Creech RH, Tormey DC, et al. [phone]). Toxicity and response criteria of the [facility]. Am. DOROTHA Bridges. Oncol. 5 (6): 649-55  LABORATORY DATA:  Lab Results  Component Value Date   WBC 7.3 08/23/2023   HGB 8.3 (L) 08/23/2023   HCT 26.3 (L) 08/23/2023   MCV 75.8 (L) 08/23/2023   PLT 206 08/23/2023   NEUTROABS 4.7 08/23/2023   Lab Results  Component Value Date   NA 137 08/23/2023   K 4.6 08/23/2023   CL 105 08/23/2023   CO2 22 08/23/2023   GLUCOSE 116 (H) 08/23/2023   BUN 23 08/23/2023   CREATININE 1.39 (H) 08/23/2023   CALCIUM  10.1 08/23/2023      RADIOGRAPHY: MR BRAIN W WO CONTRAST Result Date: 08/19/2023 CLINICAL DATA:  Provided history: Malignant neoplasm of unspecified part of unspecified bronchus or lung. Non-small cell lung cancer, staging. EXAM: MRI HEAD WITHOUT AND WITH CONTRAST TECHNIQUE: Multiplanar, multiecho pulse sequences of the brain and surrounding structures were obtained without and with intravenous contrast. CONTRAST:  10mL GADAVIST  GADOBUTROL  1 MMOL/ML IV SOLN COMPARISON:  Head CT 03/17/2022. FINDINGS: Brain: No age-advanced or lobar predominant cerebral atrophy. Multifocal T2 FLAIR hyperintense signal abnormality within the cerebral white matter, nonspecific but compatible with mild chronic small vessel ischemic disease. 7 mm bilobed T2 hyperintense and non-enhancing or hypoenhancing focus within the posterior aspect of the sella (for instance as seen on series  15, image 21) (series 18, image 15). No cortical encephalomalacia is identified. There is no acute infarct. No chronic intracranial blood products. No extra-axial fluid collection. No midline shift. No pathologic parenchymal enhancement identified. Vascular: Maintained  flow voids within the proximal large arterial vessels. 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 Skull and upper cervical spine: No focal worrisome marrow lesion. Incompletely assessed cervical spondylosis. Sinuses/Orbits: No mass or acute finding within the imaged orbits. No significant paranasal sinus disease. Other: Trace fluid within bilateral mastoid air cells. Impression #2 will be called to the ordering clinician or representative by the Radiologist Assistant, and communication documented in the PACS or [employer]. IMPRESSION: 1. No evidence of intracranial metastatic disease. 2. 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 No further imaging evaluation or imaging follow-up is necessary for this finding. Consider endocrine function tests and correlate for a history of pituitary hypersecretion. This follows ACR consensus guidelines: Management of Incidental Pituitary Findings on CT, MRI and F18-FDG PET: A White Paper of the ACR Incidental Findings Committee. J Am Coll Radiol 2018; 15: 966-72. 3. Mild chronic small vessel ischemic changes within the cerebral white matter. Electronically Signed   By: Rockey Childs D.O.   On: 08/19/2023 09:22   DG Abd 1 View Result Date: 08/18/2023 CLINICAL DATA:  Non-small cell lung cancer (NSCLC), staging foreign body/ MRI clearance EXAM: ABDOMEN - 1 VIEW COMPARISON:  Jun 14, 2023 FINDINGS: Nonobstructive bowel gas pattern. No pneumoperitoneum. No organomegaly or radiopaque calculi. No acute fracture or destructive lesion. The lung bases are clear.Multilevel degenerative disc disease of the spine.  Bilateral hip osteoarthritis. Aortoiliac atherosclerosis IMPRESSION: Nonobstructive bowel gas pattern.  No radiopaque foreign body. Electronically Signed   By: Rogelia Myers M.D.   On: 08/18/2023 10:19   DG Chest 2 View Result Date: 08/03/2023 CLINICAL DATA:  Shortness of breath after lung biopsy EXAM: CHEST - 2 VIEW COMPARISON:  August 01, 2023 FINDINGS: Increasing opacity in left mid lung compared to prior, may represent sequelae of prior lung biopsy/pulmonary hemorrhage. No pneumothorax. No pleural effusion. Unchanged cardiomediastinal silhouette. No acute osseous findings. IMPRESSION: Increasing left midlung opacity, likely representing sequelae of recent lung biopsy. No radiographically evident pneumothorax. Electronically Signed   By: Michaeline Blanch M.D.   On: 08/03/2023 11:03   DG Chest Port 1 View Result Date: 08/01/2023 CLINICAL DATA:  Bronchoscopy with biopsy EXAM: PORTABLE CHEST 1 VIEW COMPARISON:  CT of the chest performed May 20, 2023 FINDINGS: Similar appearance of left-sided lung lesion. Mild diffuse interstitial prominence. Heart mediastinum are not significantly changed. No pneumothorax or significant pleural effusion. IMPRESSION: 1. No pneumothorax. 2. Similar appearance of left-sided lung disease. Electronically Signed   By: Maude Naegeli M.D.   On: 08/01/2023 14:17   DG C-ARM BRONCHOSCOPY Result Date: 08/01/2023 C-ARM BRONCHOSCOPY: Fluoroscopy was utilized by the requesting physician.  No radiographic interpretation.   DG Chest 2 View Result Date: 07/29/2023 CLINICAL DATA:  Productive cough.  Left lung mass. EXAM: CHEST - 2 VIEW COMPARISON:  05/13/2023. FINDINGS: Unremarkable cardiac silhouette. There is pulmonary vascular congestion. Alveolar opacity left mid lung consistent with known mass. Compared to the previous examination the lesion appears slightly less dense. No pneumothorax or pleural effusion. Calcified aorta. Thoracic degenerative changes. IMPRESSION: Left mid lung mass  appears less dense than on the prior study which might be a treatment effect. Examination otherwise unchanged. Electronically Signed   By: Fonda Field M.D.   On: 07/29/2023 22:08      IMPRESSION: Lung cancer (HCC) Stage IIIA (cT4, cN0, cM0)  ***  Today, I talked to the patient and family about the findings and work-up  thus far.  We discussed the natural history of *** and general treatment, highlighting the role of radiotherapy in the management.  We discussed the available radiation techniques, and focused on the details of logistics and delivery.  We reviewed the anticipated acute and late sequelae associated with radiation in this setting.  The patient was encouraged to ask questions that I answered to the best of my ability. *** A patient consent form was discussed and signed.  We retained a copy for our records.  The patient would like to proceed with radiation and will be scheduled for CT simulation.  PLAN: ***    *** minutes of total time was spent for this patient encounter, including preparation, face-to-face counseling with the patient and coordination of care, physical exam, and documentation of the encounter.   ------------------------------------------------  Lynwood CHARM Nasuti, PhD, MD  This document serves as a record of services personally performed by Lynwood Nasuti, MD. It was created on his behalf by Reymundo Cartwright, a trained medical scribe. The creation of this record is based on the scribe's personal observations and the provider's statements to them. This document has been checked and approved by the attending provider.

## 2023-08-23 NOTE — Patient Instructions (Addendum)
 You have been diagnosed with stage IIIA non-small cell lung cancer (adenocarcinoma). Today, we talked about your treatment options and next steps.  Right now, you are leaning toward not wanting treatment. It's important to understand that without treatment, the cancer will continue to grow and spread, which can cause symptoms and complications over time. If you decide not to move forward with treatment, we recommend speaking with your primary care doctor about hospice and palliative care when the time is right. These services can help manage symptoms and support your quality of life.  We reviewed the following treatment options:  1) Neoadjuvant therapy (treatment before possible surgery): This would involve chemotherapy and/immunotherapy (treatment given in an IV) first, followed by surgery--but only if your lung function is good enough. You are scheduled for pulmonary function tests (PFTs) tomorrow to help us  decide if surgery would be an option. It sounds like you are not interested in this option.   2) Chemotherapy with radiation: This would involve a low-dose chemotherapy once a week (usually Mondays) along with daily radiation for about 6 weeks. Most patients do well with this and don't have major side effects with the chemo since the dose is low. You shared that you're not interested in chemotherapy, even in this mild form.  You have an appointment with the radiation doctor later this week. You can ask them whether radiation without chemotherapy might be possible.  We will see you again in 2 weeks, after your lung tests (PFTs) are done and after you've had a chance to meet with the radiation doctor and think more about your decision.  Please let us  know if you have any questions or if your preferences change.

## 2023-08-24 ENCOUNTER — Ambulatory Visit (INDEPENDENT_AMBULATORY_CARE_PROVIDER_SITE_OTHER): Admitting: Emergency Medicine

## 2023-08-24 DIAGNOSIS — R918 Other nonspecific abnormal finding of lung field: Secondary | ICD-10-CM | POA: Diagnosis not present

## 2023-08-24 NOTE — Progress Notes (Signed)
 Full pft performed today.

## 2023-08-24 NOTE — Patient Instructions (Signed)
 Full pft performed today.

## 2023-08-24 NOTE — Telephone Encounter (Signed)
LVM to review results.

## 2023-08-25 ENCOUNTER — Ambulatory Visit
Admission: RE | Admit: 2023-08-25 | Discharge: 2023-08-25 | Disposition: A | Discharge status: Home / Self Care | Source: Ambulatory Visit | Attending: Radiation Oncology | Admitting: Radiation Oncology

## 2023-08-25 ENCOUNTER — Encounter: Payer: Self-pay | Admitting: Radiation Oncology

## 2023-08-25 ENCOUNTER — Telehealth: Payer: Self-pay

## 2023-08-25 VITALS — BP 165/67 | HR 103 | Temp 98.1°F | Resp 20 | Ht 66.0 in | Wt 222.2 lb

## 2023-08-25 DIAGNOSIS — I10 Essential (primary) hypertension: Secondary | ICD-10-CM | POA: Insufficient documentation

## 2023-08-25 DIAGNOSIS — M129 Arthropathy, unspecified: Secondary | ICD-10-CM | POA: Diagnosis not present

## 2023-08-25 DIAGNOSIS — E119 Type 2 diabetes mellitus without complications: Secondary | ICD-10-CM | POA: Insufficient documentation

## 2023-08-25 DIAGNOSIS — Z79899 Other long term (current) drug therapy: Secondary | ICD-10-CM | POA: Diagnosis not present

## 2023-08-25 DIAGNOSIS — Z7984 Long term (current) use of oral hypoglycemic drugs: Secondary | ICD-10-CM | POA: Diagnosis not present

## 2023-08-25 DIAGNOSIS — E785 Hyperlipidemia, unspecified: Secondary | ICD-10-CM | POA: Insufficient documentation

## 2023-08-25 DIAGNOSIS — Z7982 Long term (current) use of aspirin: Secondary | ICD-10-CM | POA: Diagnosis not present

## 2023-08-25 DIAGNOSIS — E039 Hypothyroidism, unspecified: Secondary | ICD-10-CM | POA: Diagnosis not present

## 2023-08-25 DIAGNOSIS — I700 Atherosclerosis of aorta: Secondary | ICD-10-CM | POA: Diagnosis not present

## 2023-08-25 DIAGNOSIS — M47812 Spondylosis without myelopathy or radiculopathy, cervical region: Secondary | ICD-10-CM | POA: Insufficient documentation

## 2023-08-25 DIAGNOSIS — Z923 Personal history of irradiation: Secondary | ICD-10-CM | POA: Diagnosis not present

## 2023-08-25 DIAGNOSIS — Z8701 Personal history of pneumonia (recurrent): Secondary | ICD-10-CM | POA: Diagnosis not present

## 2023-08-25 DIAGNOSIS — Z87891 Personal history of nicotine dependence: Secondary | ICD-10-CM | POA: Diagnosis not present

## 2023-08-25 DIAGNOSIS — Z808 Family history of malignant neoplasm of other organs or systems: Secondary | ICD-10-CM | POA: Insufficient documentation

## 2023-08-25 DIAGNOSIS — Z860101 Personal history of adenomatous and serrated colon polyps: Secondary | ICD-10-CM | POA: Diagnosis not present

## 2023-08-25 DIAGNOSIS — C3432 Malignant neoplasm of lower lobe, left bronchus or lung: Secondary | ICD-10-CM

## 2023-08-25 DIAGNOSIS — Z794 Long term (current) use of insulin: Secondary | ICD-10-CM | POA: Diagnosis not present

## 2023-08-25 DIAGNOSIS — Z801 Family history of malignant neoplasm of trachea, bronchus and lung: Secondary | ICD-10-CM | POA: Insufficient documentation

## 2023-08-25 DIAGNOSIS — K219 Gastro-esophageal reflux disease without esophagitis: Secondary | ICD-10-CM | POA: Insufficient documentation

## 2023-08-25 DIAGNOSIS — J449 Chronic obstructive pulmonary disease, unspecified: Secondary | ICD-10-CM | POA: Diagnosis not present

## 2023-08-25 DIAGNOSIS — M47814 Spondylosis without myelopathy or radiculopathy, thoracic region: Secondary | ICD-10-CM | POA: Diagnosis not present

## 2023-08-25 DIAGNOSIS — D649 Anemia, unspecified: Secondary | ICD-10-CM | POA: Diagnosis not present

## 2023-08-25 LAB — PULMONARY FUNCTION TEST
DL/VA % pred: 77 %
DL/VA: 3.15 ml/min/mmHg/L
DLCO unc % pred: 53 %
DLCO unc: 10.71 ml/min/mmHg
FEF 25-75 Post: 2.09 L/s
FEF 25-75 Pre: 1.97 L/s
FEF2575-%Change-Post: 6 %
FEF2575-%Pred-Post: 123 %
FEF2575-%Pred-Pre: 116 %
FEV1-%Change-Post: 0 %
FEV1-%Pred-Post: 78 %
FEV1-%Pred-Pre: 78 %
FEV1-Post: 1.75 L
FEV1-Pre: 1.74 L
FEV1FVC-%Change-Post: -1 %
FEV1FVC-%Pred-Pre: 112 %
FEV6-%Change-Post: 0 %
FEV6-%Pred-Post: 73 %
FEV6-%Pred-Pre: 73 %
FEV6-Post: 2.08 L
FEV6-Pre: 2.07 L
FEV6FVC-%Pred-Post: 105 %
FEV6FVC-%Pred-Pre: 105 %
FVC-%Change-Post: 2 %
FVC-%Pred-Post: 72 %
FVC-%Pred-Pre: 70 %
FVC-Post: 2.13 L
FVC-Pre: 2.07 L
Post FEV1/FVC ratio: 82 %
Post FEV6/FVC ratio: 100 %
Pre FEV1/FVC ratio: 84 %
Pre FEV6/FVC Ratio: 100 %
RV % pred: -28 %
RV: -0.67 L
TLC % pred: 97 %
TLC: 5.14 L

## 2023-08-25 MED ORDER — VALSARTAN 80 MG PO TABS: 80.0000 mg | ORAL_TABLET | Freq: Every day | ORAL | 0 refills | Status: DC

## 2023-08-25 NOTE — Telephone Encounter (Signed)
 LVM and mailed letter to home to call office and review results.

## 2023-08-25 NOTE — Telephone Encounter (Signed)
 Called pt back informed her 08/22/2023 a 90 day supply was sent in, unable to tell when she will receive but pt requested a 30 day local since she has been out for week. Sent 30 day supply to [facility].

## 2023-08-25 NOTE — Telephone Encounter (Signed)
 Copied from CRM [id number]. Topic: Clinical - Prescription Issue >> Aug 25, 2023  1:47 PM Tamara [name] wrote: Pt would like to know what's happening with her rx valsartan  (DIOVAN ) 80 MG tablet [[id number]]

## 2023-08-26 ENCOUNTER — Telehealth: Payer: Self-pay

## 2023-08-26 DIAGNOSIS — C3432 Malignant neoplasm of lower lobe, left bronchus or lung: Secondary | ICD-10-CM | POA: Diagnosis not present

## 2023-08-26 DIAGNOSIS — Z87891 Personal history of nicotine dependence: Secondary | ICD-10-CM | POA: Diagnosis not present

## 2023-08-26 NOTE — Telephone Encounter (Signed)
 Spoke with the patient regarding recent lab results. Per Cassie, PA, the patient's iron  levels are low. IV iron  therapy was offered, but the patient stated she has just resumed taking oral Iron  65 mg daily as of today and would prefer to continue with that instead of proceeding with IV iron .  During the conversation, the patient also stated, "I am not going to do the surgery or the chemo, so go ahead and cancel the appointments on the 21st." Encouraged the patient to keep the appointment to discuss her thoughts and concerns with Dr. Sherrod, but she declined and insisted on canceling. Patient was advised to call the office if she wishes to reschedule.  Information relayed to Dr. Sherrod.

## 2023-09-01 LAB — FUNGUS CULTURE WITH STAIN

## 2023-09-01 LAB — FUNGAL ORGANISM REFLEX

## 2023-09-01 LAB — FUNGUS CULTURE RESULT

## 2023-09-05 ENCOUNTER — Ambulatory Visit: Admitting: Pulmonary Disease

## 2023-09-08 ENCOUNTER — Ambulatory Visit: Admitting: Internal Medicine

## 2023-09-08 ENCOUNTER — Other Ambulatory Visit

## 2023-09-12 LAB — ACID FAST CULTURE WITH REFLEXED SENSITIVITIES (MYCOBACTERIA)
Acid Fast Culture: NEGATIVE
Acid Fast Culture: NEGATIVE

## 2023-09-20 ENCOUNTER — Ambulatory Visit: Admitting: Internal Medicine

## 2023-09-21 ENCOUNTER — Telehealth: Payer: Self-pay | Admitting: Internal Medicine

## 2023-09-21 NOTE — Telephone Encounter (Signed)
 Too soon for refills.

## 2023-09-21 NOTE — Telephone Encounter (Signed)
traMADol (ULTRAM) 50 MG tablet ° °

## 2023-09-21 NOTE — Telephone Encounter (Signed)
 Baclofen  5 MG TABS

## 2023-09-22 ENCOUNTER — Ambulatory Visit: Admitting: Internal Medicine

## 2023-09-22 ENCOUNTER — Encounter: Payer: Self-pay | Admitting: Internal Medicine

## 2023-09-22 VITALS — BP 124/82 | HR 106 | Ht 66.0 in | Wt 225.6 lb

## 2023-09-22 DIAGNOSIS — E785 Hyperlipidemia, unspecified: Secondary | ICD-10-CM | POA: Diagnosis not present

## 2023-09-22 DIAGNOSIS — E039 Hypothyroidism, unspecified: Secondary | ICD-10-CM | POA: Insufficient documentation

## 2023-09-22 DIAGNOSIS — Z6838 Body mass index (BMI) 38.0-38.9, adult: Secondary | ICD-10-CM

## 2023-09-22 DIAGNOSIS — E237 Disorder of pituitary gland, unspecified: Secondary | ICD-10-CM

## 2023-09-22 DIAGNOSIS — E1165 Type 2 diabetes mellitus with hyperglycemia: Secondary | ICD-10-CM

## 2023-09-22 DIAGNOSIS — Z7984 Long term (current) use of oral hypoglycemic drugs: Secondary | ICD-10-CM | POA: Diagnosis not present

## 2023-09-22 DIAGNOSIS — E1159 Type 2 diabetes mellitus with other circulatory complications: Secondary | ICD-10-CM | POA: Diagnosis not present

## 2023-09-22 DIAGNOSIS — E66812 Obesity, class 2: Secondary | ICD-10-CM

## 2023-09-22 LAB — POCT GLYCOSYLATED HEMOGLOBIN (HGB A1C): Hemoglobin A1C: 6.7 % — AB (ref 4.0–5.6)

## 2023-09-22 NOTE — Progress Notes (Addendum)
 -Patient ID: Tamara [name], female   DOB: September 24, 1945, 78 y.o.   MRN: [id number]  HPI: Tamara [name] is a 78 y.o.-year-old female, returning for follow-up for DM2, dx in 2006, insulin -dependent, uncontrolled, with complications (aortic atherosclerosis) and hypothyroidism Pt. previously saw Dr. Kassie, but last visit with me 4 months ago. She is the mother of Ajani Schnieders, also my pt.  She is here with her husband.  Interim hx: No increased urination, blurry vision, nausea.  She does have significant shortness of breath and is on oxygen now. Since last visit, she was diagnosed with lung cancer. No plan for surgery. She refused chemotherapy. She had PNA in June 2025 >> on ABx.   DM2: Reviewed HbA1c: Lab Results  Component Value Date   HGBA1C 7.7 (A) 05/19/2023   HGBA1C 7.0 (A) 12/23/2022   HGBA1C 8.7 (H) 10/27/2022   HGBA1C 8.8 (H) 09/23/2022   HGBA1C 7.7 (A) 06/10/2022   HGBA1C 7.7 (A) 11/16/2021   HGBA1C 10.1 (A) 08/11/2021   HGBA1C 8.7 (A) 03/26/2021   HGBA1C 7.3 (A) 11/26/2020   HGBA1C 7.4 (A) 07/18/2020   HGBA1C 7.5 (A) 03/19/2020   HGBA1C 8.0 (A) 12/19/2019   HGBA1C 7.4 (A) 09/17/2019   HGBA1C 9.0 (A) 07/17/2019   HGBA1C 11.3 (A) 05/09/2019   HGBA1C 10.8 (H) 03/22/2019   HGBA1C 8.2 (H) 11/22/2018   HGBA1C 9.4 (H) 08/15/2018   HGBA1C 7.1 (H) 04/11/2018   HGBA1C 6.6 (H) 12/07/2017   HGBA1C 6.9 (H) 09/05/2017   HGBA1C 6.6 (H) 05/26/2017   HGBA1C 6.4 (H) 02/23/2017   HGBA1C 5.9 (H) 11/22/2016   HGBA1C 5.4 08/23/2016   Pt is on a regimen of: - Metformin  1000 mg 2x a day  - NPH  40 >> 36 >> 40 units in am and 24 >> 28-30 >> 30 units at bedtime She tried metformin  in the past but this caused arthralgias, however, she restarted as her CBGs were in the 400 She was previously on insulin  in 2017-2018, but came off after losing approximately 40 pounds. Per Dr. Laymond note, she previously declined multiple daily insulin  injections. We tried Farxiga  07/2021 >>  $$$.  Pt checks her sugars 2x a day and they are: - am: 121-143 >> 82-123, 131 >> 95-120s, 160 >> 85-135, 145 - 2h after b'fast: n/c  - before lunch: 111, 133, 137, 151, 157 >> n/c >> 120-167 >> n/c - 2h after lunch: n/c  >> 146- 177 >> 131, 137, 162 >> n/c - before dinner: n/c >> 101, 104 >> n/c >> 110-154 >> 135-140 >> n/c - 2h after dinner: n/c - bedtime: 164 >> n/c - nighttime: n/c Lowest sugar was 82 >> 95 >> 85; she has hypoglycemia awareness at 70.  Highest sugar was 500s -steroid >> 200s >> 200s.  She has been on steroids for Covid PNA >> 09-10/2022.  In house, she was septic and CBG increased to 500.  Glucometer: True Metrix  - no CKD, last BUN/creatinine:  Lab Results  Component Value Date   BUN 23 08/23/2023   BUN 8 08/01/2023   CREATININE 1.39 (H) 08/23/2023   CREATININE 1.07 (H) 08/01/2023   Lab Results  Component Value Date   MICRALBCREAT 6 12/23/2022   MICRALBCREAT 7 10/21/2021  She is not on ACE inhibitor/ARB.  -+ HL; last set of lipids: Lab Results  Component Value Date   CHOL 149 05/19/2023   HDL 47 (L) 05/19/2023   LDLCALC 71 05/19/2023   LDLDIRECT 64 03/15/2022  TRIG 216 (H) 05/19/2023   CHOLHDL 3.2 05/19/2023  On Crestor  20 mg and Zetia  10 daily.  - last eye exam was in 09/2021. No DR reportingly. + cataracts.  - no numbness and tingling in her feet.  Last foot exam 05/19/2023.  Reviewed latest imaging: CT chest (08/06/2021): Atherosclerosis of aorta, great vessels, and coronary arteries.  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  Of note, this mass was not FDG avid on PET scan.  Hypothyroidism:  She is on levothyroxine  50 mcg daily: - in am - fasting - at least 30 min from b'fast - no calcium  - no iron  - no multivitamins - + PPIs - Protonix  30 min later! >> moved >4h later - +  on Biotin 5000 mcg daily - last dose this morning!  Reviewed her latest TSH levels: Lab Results  Component Value Date   TSH 3.33  10/27/2022   TSH 3.34 08/26/2021   TSH 3.88 07/28/2020   TSH 2.24 11/15/2019   TSH 2.45 03/22/2019   TSH 4.62 (H) 11/22/2018   TSH 3.92 05/26/2017   She also has a history of HTN, GERD.  Since last visit, she had a brain MRI (08/19/2023) showing a pituitary lesion: 1. No evidence of intracranial metastatic disease. 2. 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 No further imaging evaluation or imaging follow-up is necessary for this finding. Consider endocrine function tests and correlate for a history of pituitary hypersecretion. 3. Mild chronic small vessel ischemic changes within the cerebral white matter.  ROS: + see HPI  Past Medical History:  Diagnosis Date   Anemia    Arthritis    knee and shoulders   Colon polyps 09/05/2017   COPD (chronic obstructive pulmonary disease) (HCC)    Diabetes mellitus without complication (HCC)    type II   Dyspnea    05/16/20 has had a cough for 2.5 years post Covid   Gastritis 09/05/2017   GERD (gastroesophageal reflux disease)    Hyperlipidemia    Hypertension    patient denies   Hypothyroidism    Panic attack    Pneumonia    RUQ pain 03/09/2017   Per patient, she has had RUQ pain 4-5 years that has grown worse in the last few months.   Past Surgical History:  Procedure Laterality Date   BREAST BIOPSY Left 2008   CORE W/CLIP - NEG   BRONCHIAL BIOPSY  05/19/2020   Procedure: BRONCHIAL BIOPSIES;  Surgeon: Shelah Lamar RAMAN, MD;  Location: [facility] ENDOSCOPY;  Service: Pulmonary;;   BRONCHIAL BIOPSY  08/01/2023   Procedure: BRONCHOSCOPY, WITH BIOPSY;  Surgeon: Shelah Lamar RAMAN, MD;  Location: [facility] ENDOSCOPY;  Service: Pulmonary;;   BRONCHIAL BRUSHINGS  05/19/2020   Procedure: BRONCHIAL BRUSHINGS;  Surgeon: Shelah Lamar RAMAN, MD;  Location: [facility] ENDOSCOPY;  Service: Pulmonary;;   BRONCHIAL BRUSHINGS  08/01/2023   Procedure: BRONCHOSCOPY, WITH BRUSH BIOPSY;  Surgeon: Shelah Lamar RAMAN,  MD;  Location: [facility] ENDOSCOPY;  Service: Pulmonary;;   BRONCHIAL NEEDLE ASPIRATION BIOPSY  05/19/2020   Procedure: BRONCHIAL NEEDLE ASPIRATION BIOPSIES;  Surgeon: Shelah Lamar RAMAN, MD;  Location: [facility] ENDOSCOPY;  Service: Pulmonary;;   BRONCHIAL NEEDLE ASPIRATION BIOPSY  08/01/2023   Procedure: BRONCHOSCOPY, WITH NEEDLE ASPIRATION BIOPSY;  Surgeon: Shelah Lamar RAMAN, MD;  Location[facility]COPY;  Service: Pulmonary;;   BRONCHIAL WASHINGS  05/19/2020   Procedure: BRONCHIAL WASHINGS;  Surgeon: Shelah Lamar RAMAN, MD;  Location: [facility] ENDOSCOPY;  Service: Pulmonary;;   BRONCHIAL WASHINGS  08/01/2023   Procedure: IRRIGATION, BRONCHUS;  Surgeon: Shelah Lamar RAMAN, MD;  Location: [facility] ENDOSCOPY;  Service: Pulmonary;;   BRONCHOSCOPY, WITH BIOPSY USING ELECTROMAGNETIC NAVIGATION Left 08/01/2023   Procedure: BRONCHOSCOPY, WITH BIOPSY USING ELECTROMAGNETIC NAVIGATION;  Surgeon: Shelah Lamar RAMAN, MD;  Location: [facility] ENDOSCOPY;  Service: Pulmonary;  Laterality: Left;   COLONOSCOPY WITH PROPOFOL  N/A 02/08/2017   Procedure: COLONOSCOPY WITH PROPOFOL ;  Surgeon: Gaylyn Gladis PENNER, MD;  Location: [facility] ENDOSCOPY;  Service: Endoscopy;  Laterality: N/A;   COLONOSCOPY WITH PROPOFOL  N/A 05/05/2022   Procedure: COLONOSCOPY WITH PROPOFOL ;  Surgeon: Unk Corinn Skiff, MD;  Location: [facility] ENDOSCOPY;  Service: Gastroenterology;  Laterality: N/A;   ESOPHAGOGASTRODUODENOSCOPY (EGD) WITH PROPOFOL  N/A 02/08/2017   Procedure: ESOPHAGOGASTRODUODENOSCOPY (EGD) WITH PROPOFOL ;  Surgeon: Gaylyn Gladis PENNER, MD;  Location: [facility] ENDOSCOPY;  Service: Endoscopy;  Laterality: N/A;   TUBAL LIGATION     VIDEO BRONCHOSCOPY WITH ENDOBRONCHIAL NAVIGATION N/A 05/19/2020   Procedure: VIDEO BRONCHOSCOPY WITH ENDOBRONCHIAL NAVIGATION;  Surgeon: Shelah Lamar RAMAN, MD;  Location: [facility] ENDOSCOPY;  Service: Pulmonary;  Laterality: N/A;   Social History   Socioeconomic History   Marital status: Married    Spouse name: Kieana Livesay   Number of children: 3   Years  of education: Not on file   Highest education level: High school graduate  Occupational History   Occupation: retired  Tobacco Use   Smoking status: Former    Current packs/day: 0.00    Average packs/day: 1 pack/day for 40.0 years (40.0 ttl pk-yrs)    Types: Cigarettes    Start date: 1966    Quit date: 2006    Years since quitting: 19.6   Smokeless tobacco: Never   Tobacco comments:    Smoking cessation materials not required  Vaping Use   Vaping status: Never Used  Substance and Sexual Activity   Alcohol use: No   Drug use: No   Sexual activity: Not on file  Other Topics Concern   Not on file  Social History Narrative   Not on file   Social Drivers of Health   Financial Resource Strain: Low Risk  (12/02/2022)   Overall Financial Resource Strain (CARDIA)    Difficulty of Paying Living Expenses: Not hard at all  Food Insecurity: Food Insecurity Present (08/25/2023)   Hunger Vital Sign    Worried About Running Out of Food in the Last Year: Sometimes true    Ran Out of Food in the Last Year: Often true  Transportation Needs: No Transportation Needs (08/25/2023)   PRAPARE - Administrator, Civil Service (Medical): No    Lack of Transportation (Non-Medical): No  Physical Activity: Insufficiently Active (12/02/2022)   Exercise Vital Sign    Days of Exercise per Week: 3 days    Minutes of Exercise per Session: 30 min  Stress: No Stress Concern Present (12/02/2022)   [employer] of Occupational Health - Occupational Stress Questionnaire    Feeling of Stress : Not at all  Social Connections: Moderately Isolated (12/02/2022)   Social Connection and Isolation Panel    Frequency of Communication with Friends and Family: More than three times a week    Frequency of Social Gatherings with Friends and Family: Once a week    Attends Religious Services: Never    Database administrator or Organizations: No    Attends Banker Meetings: Never    Marital  Status: Married  Catering manager  Violence: Not At Risk (08/25/2023)   Humiliation, Afraid, Rape, and Kick questionnaire    Fear of Current or Ex-Partner: No    Emotionally Abused: No    Physically Abused: No    Sexually Abused: No   Current Outpatient Medications on File Prior to Visit  Medication Sig Dispense Refill   Accu-Chek Softclix Lancets lancets TEST BLOOD SUGAR TWO TIMES DAILY 200 each 3   albuterol  (PROVENTIL ) (2.5 MG/3ML) 0.083% nebulizer solution Take 3 mLs (2.5 mg total) by nebulization every 6 (six) hours as needed for wheezing or shortness of breath. 180 mL 5   albuterol  (VENTOLIN  HFA) 108 (90 Base) MCG/ACT inhaler Inhale 2 puffs into the lungs every 6 (six) hours as needed for wheezing or shortness of breath. 8 g 6   Alcohol Swabs (DROPSAFE ALCOHOL PREP) 70 % PADS USE AS DIRECTED TWO TIMES DAILY WHEN TESTING BLOOD SUGAR 200 each 3   Ascorbic Acid  (VITAMIN C ) 1000 MG tablet Take 1,000 mg by mouth daily.     aspirin  EC 81 MG tablet Take 1 tablet (81 mg total) by mouth daily. Swallow whole. 90 tablet 3   Baclofen  5 MG TABS Take 1 tablet (5 mg total) by mouth at bedtime as needed (msk pain or spasms). 90 tablet 1   benzonatate  (TESSALON ) 200 MG capsule Take 1 capsule (200 mg total) by mouth 3 (three) times daily as needed. 45 capsule 1   Blood Glucose Monitoring Suppl (TRUE METRIX AIR GLUCOSE METER) w/Device KIT USE AS DIRECTED 1 kit 3   Cholecalciferol  (VITAMIN D ) 50 MCG (2000 UT) CAPS Take 1,000 Units by mouth daily.     DROPLET PEN NEEDLES 32G X 4 MM MISC USE AS DIRECTED 200 each 3   ezetimibe  (ZETIA ) 10 MG tablet TAKE 1 TABLET EVERY DAY 90 tablet 1   Ferrous Fumarate (HEMOCYTE - 106 MG FE) 324 (106 Fe) MG TABS tablet Take 1 tablet by mouth daily.     Ferrous Fumarate-DSS (FE CAPS/STOOL SOFTENER) 50-100 MG TABS Take 50-100 mg by mouth daily. 30 tablet 3   furosemide  (LASIX ) 40 MG tablet TAKE 1 TABLET EVERY DAY 90 tablet 0   hydrochlorothiazide  (MICROZIDE ) 12.5 MG capsule Take 1  capsule (12.5 mg total) by mouth daily. 90 capsule 1   Insulin  NPH, Human,, Isophane, (NOVOLIN  N FLEXPEN) 100 UNIT/ML Kiwkpen INJECT 40 UNITS UNDER THE SKIN IN THE MORNING AND 30 TO 30 UNITS AT BEDTIME 75 mL 3   levothyroxine  (SYNTHROID ) 50 MCG tablet TAKE 1 TABLET EVERY DAY BEFORE BREAKFAST 90 tablet 3   metFORMIN  (GLUCOPHAGE ) 1000 MG tablet TAKE 1 TABLET TWICE DAILY 180 tablet 3   metoCLOPramide  (REGLAN ) 10 MG tablet Take 1 tablet (10 mg total) by mouth every 6 (six) hours as needed. 30 tablet 0   nystatin  (MYCOSTATIN /NYSTOP ) powder Apply 1 Application topically daily as needed (yeast under belly). 60 g 2   pantoprazole  (PROTONIX ) 40 MG tablet TAKE 1 TABLET TWICE DAILY 180 tablet 3   potassium chloride  (KLOR-CON  M) 10 MEQ tablet Take 1 tablet (10 mEq total) by mouth daily. 90 tablet 2   rosuvastatin  (CRESTOR ) 20 MG tablet Take 1 tablet (20 mg total) by mouth at bedtime. 90 tablet 1   sucralfate  (CARAFATE ) 1 g tablet Take 1 tablet (1 g total) by mouth 4 (four) times daily. (Patient not taking: Reported on 08/25/2023) 120 tablet 1   sulfamethoxazole -trimethoprim  (BACTRIM  DS) 800-160 MG tablet Take 1 tablet by mouth 2 (two) times daily. 14 tablet 0   sulfamethoxazole -trimethoprim  (  BACTRIM  DS) 800-160 MG tablet Take 1 tablet by mouth 2 (two) times daily. 14 tablet 0   Tiotropium Bromide -Olodaterol (STIOLTO RESPIMAT ) 2.5-2.5 MCG/ACT AERS Inhale 2 puffs into the lungs daily. 4 g 11   traMADol  (ULTRAM ) 50 MG tablet Take 1 tablet (50 mg total) by mouth daily as needed for severe pain (pain score 7-10). 30 tablet 2   TRUE METRIX BLOOD GLUCOSE TEST test strip Use 2x a day 200 each 3   valsartan  (DIOVAN ) 80 MG tablet Take 1 tablet (80 mg total) by mouth daily. 30 tablet 0   No current facility-administered medications on file prior to visit.   Allergies  Allergen Reactions   Penicillins Hives    Tolerated rocephin  09/2022 Reaction: 1964   Family History  Problem Relation Age of Onset   Throat cancer  Father    Diabetes Maternal Grandmother    Skin cancer Maternal Grandmother    Breast cancer Neg Hx    PE: BP 124/82   Pulse (!) 106   Ht 5' 6 (1.676 m)   Wt 225 lb 9.6 oz (102.3 kg)   SpO2 96%   BMI 36.41 kg/m  Wt Readings from Last 3 Encounters:  09/22/23 225 lb 9.6 oz (102.3 kg)  08/25/23 222 lb 3.2 oz (100.8 kg)  08/23/23 221 lb 6.4 oz (100.4 kg)   Constitutional: overweight, in NAD Eyes: no exophthalmos ENT:  no thyromegaly, no cervical lymphadenopathy Cardiovascular: tachycardia, RR, No MRG, + B LE edema Respiratory: + Bilateral wheezes, crackles throughout both pulmonary fields Musculoskeletal: no deformities Skin:  no rashes Neurological: no tremor with outstretched hands  ASSESSMENT: 1. DM2, insulin -dependent, uncontrolled, with  complications - Aortic atherosclerosis per PET scan from 10/22/2022  2.  Hypothyroidism  3. Hyperlipidemia  4.  Pituitary lesion  PLAN:  1. Patient with history of uncontrolled type 2 diabetes on intermediate acting insulin  and metformin .  At last visit, HbA1c was higher, increased to 7.7% from 7.0% and sugars were higher, especially later in the day.  This could be related to worsening lung status and steroid courses.  We discussed about trying a GLP-1 receptor agonist but she declines this class of medications.  She did not want to add any other medications to her regimen.  In that case, I advised her to increase her morning NPH and continue to the rest of the regimen. - At today's visit, sugars are mostly at goal when she wakes up.  She is not checking sugars later in the day.  For now, I recommended to continue the current regimen - I suggested to:  Patient Instructions  Please continue: - Metformin  1000 mg 2x a day - NPH 40 units in am and 30 units at bedtime   Please continue Levothyroxine  50 mcg daily.  Take the thyroid  hormone every day, with water, at least 30 minutes before breakfast, separated by at least 4 hours from: -  acid reflux medications - calcium  - iron  - multivitamins  Stop Hair Skin and Nails for 1 week, then come back for labs.  Please return in 3-4 months with your sugar log.   - we checked her HbA1c: 6.7% (lower) - advised to check sugars at different times of the day - 2x a day, rotating check times - advised for yearly eye exams >> she is not UTD - will check an ACR when she returns to the lab. - return to clinic in 3-4 months  2. Hypothyroidism: - latest thyroid  labs reviewed with pt. >> normal:  Lab Results  Component Value Date   TSH 3.33 10/27/2022  - she continues on LT4 50 mcg daily - pt feels good on this dose. - we discussed about taking the thyroid  hormone every day, with water, >30 minutes before breakfast, separated by >4 hours from acid reflux medications, calcium , iron , multivitamins. Pt. is taking it correctly. - will check thyroid  tests in 1 week - will hold Biotin for now: TSH and fT4   3. HL - Latest lipid panel was reviewed from 05/2023: LDL above our target of less than 55, triglycerides elevated, HDL low: Lab Results  Component Value Date   CHOL 149 05/19/2023   HDL 47 (L) 05/19/2023   LDLCALC 71 05/19/2023   LDLDIRECT 64 03/15/2022   TRIG 216 (H) 05/19/2023   CHOLHDL 3.2 05/19/2023  - She continues on Crestor  20 mg daily and Zetia  10 mg daily without side effect  4.  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  If negative, no further investigation needed.  She is not taking 5000 mcg biotin daily and we discussed about the supplement for a week, after which she can return for labs.  I advised her to come fasting, skipping the levothyroxine  that morning and taking it right after labs  Orders Placed This Encounter  Procedures   TSH   T4, free   Prolactin   Luteinizing hormone   Follicle  stimulating hormone   Growth hormone   Insulin -like growth factor   Cortisol   ACTH    Microalbumin / creatinine urine ratio   POCT glycosylated hemoglobin (Hb A1C)   Component     Latest Ref Rng 10/03/2023  Hemoglobin A1C     4.0 - 5.6 %   TSH     0.40 - 4.50 mIU/L 3.24   T4,Free(Direct)     0.8 - 1.8 ng/dL 1.2   Creatinine, Urine     20 - 275 mg/dL 61   Microalb, Ur     mg/dL 0.4   MICROALB/CREAT RATIO     <30 mg/g creat 7   Prolactin     ng/mL 7.0   LH     mIU/mL 35.9   FSH     mIU/mL 77.7   Growth Hormone     < OR = 7.1 ng/mL 0.2   IGF-I, LC/MS     34 - 245 ng/mL 133   Z-Score (Female)     -2.0 - 2.0 SD 0.6   Cortisol, Plasma     mcg/dL 7.9   R793 ACTH      6 - 50 pg/mL 18    Labs are all normal.  Lela Fendt, MD PhD [facility] Endocrinology

## 2023-09-22 NOTE — Patient Instructions (Addendum)
 Please continue: - Metformin  1000 mg 2x a day - NPH 40 units in am and 30 units at bedtime   Please continue Levothyroxine  50 mcg daily.  Take the thyroid  hormone every day, with water, at least 30 minutes before breakfast, separated by at least 4 hours from: - acid reflux medications - calcium  - iron  - multivitamins  Stop Hair Skin and Nails for 1 week, then come back for labs.  Please return in 3-4 months with your sugar log.

## 2023-09-28 ENCOUNTER — Other Ambulatory Visit: Payer: Self-pay | Admitting: Internal Medicine

## 2023-09-28 DIAGNOSIS — M545 Low back pain, unspecified: Secondary | ICD-10-CM

## 2023-09-28 NOTE — Telephone Encounter (Signed)
 Copied from CRM [id number]. Topic: Clinical - Medication Refill >> Sep 28, 2023  1:28 PM Tamara [name] wrote: Medication:  traMADol  (ULTRAM ) 50 MG tablet  Has the patient contacted their pharmacy? Yes (Agent: If no, request that the patient contact the pharmacy for the refill. If patient does not wish to contact the pharmacy document the reason why and proceed with request.) (Agent: If yes, when and what did the pharmacy advise?)  This is the patient's preferred pharmacy:  [facility] - [place], [place] - [address] [place] [address] Phone: [phone] Fax: [phone]  Is this the correct pharmacy for this prescription? Yes If no, delete pharmacy and type the correct one.   Has the prescription been filled recently? Yes  Is the patient out of the medication? Yes  Has the patient been seen for an appointment in the last year OR does the patient have an upcoming appointment? Yes  Can we respond through MyChart? Yes  Agent: Please be advised that Rx refills may take up to 3 business days. We ask that you follow-up with your pharmacy.

## 2023-09-28 NOTE — Telephone Encounter (Signed)
 FYI Only or Action Required?: Action required by provider: medication refill request.  Patient was last seen in primary care on 08/15/2023 by Bernardo Fend, DO.  Called Nurse Triage reporting Medication Refill.  Symptoms began today.  Interventions attempted: Nothing.  Symptoms are: stable.  Triage Disposition: No disposition on file.  Patient/caregiver understands and will follow disposition?:

## 2023-10-03 ENCOUNTER — Other Ambulatory Visit

## 2023-10-03 DIAGNOSIS — E1165 Type 2 diabetes mellitus with hyperglycemia: Secondary | ICD-10-CM | POA: Diagnosis not present

## 2023-10-03 DIAGNOSIS — E039 Hypothyroidism, unspecified: Secondary | ICD-10-CM | POA: Diagnosis not present

## 2023-10-03 DIAGNOSIS — E1159 Type 2 diabetes mellitus with other circulatory complications: Secondary | ICD-10-CM | POA: Diagnosis not present

## 2023-10-03 DIAGNOSIS — E237 Disorder of pituitary gland, unspecified: Secondary | ICD-10-CM | POA: Diagnosis not present

## 2023-10-08 LAB — LUTEINIZING HORMONE: LH: 35.9 m[IU]/mL

## 2023-10-08 LAB — CORTISOL: Cortisol, Plasma: 7.9 ug/dL

## 2023-10-08 LAB — TSH: TSH: 3.24 m[IU]/L (ref 0.40–4.50)

## 2023-10-08 LAB — INSULIN-LIKE GROWTH FACTOR
IGF-I, LC/MS: 133 ng/mL (ref 34–245)
Z-Score (Female): 0.6 {STDV} (ref ?–2.0)

## 2023-10-08 LAB — MICROALBUMIN / CREATININE URINE RATIO
Creatinine, Urine: 61 mg/dL (ref 20–275)
Microalb Creat Ratio: 7 mg/g{creat} (ref <30)
Microalb, Ur: 0.4 mg/dL

## 2023-10-08 LAB — GROWTH HORMONE: Growth Hormone: 0.2 ng/mL (ref <=7.1)

## 2023-10-08 LAB — PROLACTIN: Prolactin: 7 ng/mL

## 2023-10-08 LAB — T4, FREE: Free T4: 1.2 ng/dL (ref 0.8–1.8)

## 2023-10-08 LAB — ACTH: C206 ACTH: 18 pg/mL (ref 6–50)

## 2023-10-08 LAB — FOLLICLE STIMULATING HORMONE: FSH: 77.7 m[IU]/mL

## 2023-10-10 ENCOUNTER — Ambulatory Visit: Payer: Self-pay | Admitting: Internal Medicine

## 2023-10-10 ENCOUNTER — Other Ambulatory Visit: Payer: Self-pay

## 2023-10-10 DIAGNOSIS — E782 Mixed hyperlipidemia: Secondary | ICD-10-CM

## 2023-10-10 DIAGNOSIS — Z79899 Other long term (current) drug therapy: Secondary | ICD-10-CM

## 2023-10-10 DIAGNOSIS — I10 Essential (primary) hypertension: Secondary | ICD-10-CM

## 2023-10-11 MED ORDER — POTASSIUM CHLORIDE CRYS ER 10 MEQ PO TBCR: 10.0000 meq | EXTENDED_RELEASE_TABLET | Freq: Every day | ORAL | 0 refills | Status: DC

## 2023-10-12 ENCOUNTER — Ambulatory Visit: Payer: Self-pay

## 2023-10-12 NOTE — Telephone Encounter (Signed)
 FYI Only or Action Required?: Action required by provider: request for appointment.  Patient was last seen in primary care on 08/15/2023 by Bernardo Fend, DO.  Called Nurse Triage reporting Facial Pain.  Symptoms began yesterday.  Interventions attempted: Nothing.  Symptoms are: unchanged.Pain to left face. No drooping, no swelling or redness. Will call back for worsening of symptoms.  Triage Disposition: See Physician Within 24 Hours  Patient/caregiver understands and will follow disposition?: Yes   Copied from CRM #[id number]. Topic: Clinical - Red Word Triage >> Oct 12, 2023 10:20 AM Nathanel BROCKS wrote: Red Word that prompted transfer to Nurse Triage:  Left side of face, neck and ear hurting.    ----------------------------------------------------------------------- From previous Reason for Contact - Scheduling: Patient/patient representative is calling to schedule an appointment. Refer to attachments for appointment information. Reason for Disposition  [1] MODERATE pain (e.g., interferes with normal activities) AND [2] constant AND [3] present > 24 hours  Answer Assessment - Initial Assessment Questions 1. ONSET: When did the pain start? (e.g., minutes, hours, days)     Yesterday 2. ONSET: Does the pain come and go, or has it been constant since it started? (e.g., constant, intermittent, fleeting)     constant 3. SEVERITY: How bad is the pain? (Scale 1-10; mild, moderate or severe)     9 4. LOCATION: Where does it hurt?      Left side ear neck 5. RASH: Is there any redness, rash, or swelling of your face?     no 6. FEVER: Do you have a fever? If Yes, ask: What is it, how was it measured, and when did it start?      no 7. OTHER SYMPTOMS: Do you have any other symptoms? (e.g., fever, toothache, nasal discharge, nasal congestion, clicking sensation in jaw joint)     no 8. PREGNANCY: Is there any chance you are pregnant? When was your last menstrual  period?     no  Protocols used: Face Pain-A-AH

## 2023-10-13 ENCOUNTER — Ambulatory Visit: Admitting: Nurse Practitioner

## 2023-10-13 ENCOUNTER — Encounter: Payer: Self-pay | Admitting: Nurse Practitioner

## 2023-10-13 VITALS — BP 136/78 | HR 107 | Temp 97.8°F | Resp 18 | Ht 66.0 in | Wt 223.1 lb

## 2023-10-13 DIAGNOSIS — H66002 Acute suppurative otitis media without spontaneous rupture of ear drum, left ear: Secondary | ICD-10-CM | POA: Diagnosis not present

## 2023-10-13 DIAGNOSIS — B379 Candidiasis, unspecified: Secondary | ICD-10-CM

## 2023-10-13 DIAGNOSIS — T3695XA Adverse effect of unspecified systemic antibiotic, initial encounter: Secondary | ICD-10-CM

## 2023-10-13 DIAGNOSIS — C3432 Malignant neoplasm of lower lobe, left bronchus or lung: Secondary | ICD-10-CM

## 2023-10-13 MED ORDER — HYDROCODONE BIT-HOMATROP MBR 5-1.5 MG/5ML PO SOLN: 5.0000 mL | Freq: Three times a day (TID) | ORAL | 0 refills | Status: DC | PRN

## 2023-10-13 MED ORDER — AZITHROMYCIN 250 MG PO TABS: ORAL_TABLET | ORAL | 0 refills | Status: AC

## 2023-10-13 MED ORDER — FLUCONAZOLE 150 MG PO TABS: 150.0000 mg | ORAL_TABLET | ORAL | 0 refills | Status: DC | PRN

## 2023-10-13 NOTE — Progress Notes (Signed)
 BP 136/78   Pulse (!) 107   Temp 97.8 F (36.6 C)   Resp 18   Ht 5' 6 (1.676 m)   Wt 223 lb 1.6 oz (101.2 kg)   SpO2 94%   BMI 36.01 kg/m    Subjective:    Patient ID: Tamara [name], female    DOB: 1945/08/05, 78 y.o.   MRN: [id number]  HPI: Tamara [name] is a 78 y.o. female  Chief Complaint  Patient presents with   Ear Pain    Left side radiates down in neck    Discussed the use of AI scribe software for clinical note transcription with the patient, who gave verbal consent to proceed.  History of Present Illness Tamara [name] is a 78 year old female who presents with left ear pain.  Left ear pain - Onset 2 days ago - Localized underneath the left ear - No extension to the mastoid area - No associated fever - No sore throat  Chronic cough and pain management - Chronic cough related to lung cancer - Hydrocodone  cough syrup prescribed for cough, currently out of medication - Tramadol  prescribed for pain management, prescription not yet refilled - Next appointment with pulmonologist scheduled for October  Antibiotic allergies and adverse reactions - Allergy to penicillin - History of yeast infections with antibiotic use         08/25/2023    2:02 PM 08/23/2023    2:30 PM 12/24/2022    2:38 PM  Depression screen PHQ 2/9  Decreased Interest 0 3 0  Down, Depressed, Hopeless 1 0 0  PHQ - 2 Score 1 3 0    Relevant past medical, surgical, family and social history reviewed and updated as indicated. Interim medical history since our last visit reviewed. Allergies and medications reviewed and updated.  Review of Systems  Ten systems reviewed and is negative except as mentioned in HPI      Objective:      BP 136/78   Pulse (!) 107   Temp 97.8 F (36.6 C)   Resp 18   Ht 5' 6 (1.676 m)   Wt 223 lb 1.6 oz (101.2 kg)   SpO2 94%   BMI 36.01 kg/m    Wt Readings from Last 3 Encounters:  10/13/23 223 lb 1.6 oz (101.2 kg)  09/22/23 225 lb 9.6  oz (102.3 kg)  08/25/23 222 lb 3.2 oz (100.8 kg)    Physical Exam GENERAL: Alert, cooperative, well developed, no acute distress. HEENT: Normocephalic, normal oropharynx, moist mucous membranes. Left ear canal erythematous, TM membrane bulging and erythematous appears infected. No mastoid tenderness. CHEST: Clear to auscultation bilaterally. No wheezes, rhonchi, or crackles. CARDIOVASCULAR: Normal heart rate and rhythm. S1 and S2 normal without murmurs. ABDOMEN: Soft, non-tender, non-distended, without organomegaly. Normal bowel sounds. EXTREMITIES: No cyanosis or edema. NEUROLOGICAL: Cranial nerves grossly intact. Moves all extremities without gross motor or sensory deficit.  Results for orders placed or performed in visit on 09/22/23  POCT glycosylated hemoglobin (Hb A1C)   Collection Time: 09/22/23 10:50 AM  Result Value Ref Range   Hemoglobin A1C 6.7 (A) 4.0 - 5.6 %   HbA1c POC (<> result, manual entry)     HbA1c, POC (prediabetic range)     HbA1c, POC (controlled diabetic range)    TSH   Collection Time: 10/03/23 10:49 AM  Result Value Ref Range   TSH 3.24 0.40 - 4.50 mIU/L  T4, free   Collection Time: 10/03/23 10:49 AM  Result Value Ref Range   Free T4 1.2 0.8 - 1.8 ng/dL  Prolactin   Collection Time: 10/03/23 10:49 AM  Result Value Ref Range   Prolactin 7.0 ng/mL  Luteinizing hormone   Collection Time: 10/03/23 10:49 AM  Result Value Ref Range   LH 35.9 mIU/mL  Follicle stimulating hormone   Collection Time: 10/03/23 10:49 AM  Result Value Ref Range   FSH 77.7 mIU/mL  Growth hormone   Collection Time: 10/03/23 10:49 AM  Result Value Ref Range   Growth Hormone 0.2 < OR = 7.1 ng/mL  Insulin -like growth factor   Collection Time: 10/03/23 10:49 AM  Result Value Ref Range   IGF-I, LC/MS 133 34 - 245 ng/mL   Z-Score (Female) 0.6 -2.0 - 2.0 SD  Cortisol   Collection Time: 10/03/23 10:49 AM  Result Value Ref Range   Cortisol, Plasma 7.9 mcg/dL  ACTH    Collection  Time: 10/03/23 10:49 AM  Result Value Ref Range   C206 ACTH  18 6 - 50 pg/mL  Microalbumin / creatinine urine ratio   Collection Time: 10/03/23 10:49 AM  Result Value Ref Range   Creatinine, Urine 61 20 - 275 mg/dL   Microalb, Ur 0.4 mg/dL   Microalb Creat Ratio 7 <30 mg/g creat          Assessment & Plan:   Problem List Items Addressed This Visit       Respiratory   Lung cancer (HCC)   Relevant Medications   fluconazole  (DIFLUCAN ) 150 MG tablet   azithromycin  (ZITHROMAX ) 250 MG tablet   HYDROcodone  bit-homatropine (HYCODAN) 5-1.5 MG/5ML syrup   Other Visit Diagnoses       Non-recurrent acute suppurative otitis media of left ear without spontaneous rupture of tympanic membrane    -  Primary   Relevant Medications   fluconazole  (DIFLUCAN ) 150 MG tablet   azithromycin  (ZITHROMAX ) 250 MG tablet     Antibiotic-induced yeast infection       Relevant Medications   fluconazole  (DIFLUCAN ) 150 MG tablet   azithromycin  (ZITHROMAX ) 250 MG tablet        Assessment and Plan Assessment & Plan Acute left otitis media Acute left otitis media with significant inflammation, onset two days ago. No fever or sore throat. Known allergy to penicillins. - Prescribe azithromycin  for acute otitis media due to penicillin allergy. - Provide a pill for yeast infection prophylaxis to be taken if needed.  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 Hydrocodone  syrup has run out, with no appointment with the lung doctor until October. - Send a refill for  hycodan syrup to [facility].        Follow up plan: Return if symptoms worsen or fail to improve.

## 2023-10-21 NOTE — Progress Notes (Signed)
 Tamara [name]                                          MRN: [id number]   10/21/2023   The VBCI Quality Team Specialist reviewed this patient medical record for the purposes of chart review for care gap closure. The following were reviewed: abstraction for care gap closure-kidney health evaluation for diabetes:eGFR  and uACR.    VBCI Quality Team

## 2023-10-27 ENCOUNTER — Encounter: Payer: Self-pay | Admitting: Emergency Medicine

## 2023-10-27 ENCOUNTER — Ambulatory Visit (INDEPENDENT_AMBULATORY_CARE_PROVIDER_SITE_OTHER): Admitting: Emergency Medicine

## 2023-10-27 VITALS — BP 130/66 | HR 101 | Temp 98.9°F | Ht 66.0 in | Wt 224.8 lb

## 2023-10-27 DIAGNOSIS — C3432 Malignant neoplasm of lower lobe, left bronchus or lung: Secondary | ICD-10-CM | POA: Diagnosis not present

## 2023-10-27 DIAGNOSIS — J449 Chronic obstructive pulmonary disease, unspecified: Secondary | ICD-10-CM | POA: Diagnosis not present

## 2023-10-27 DIAGNOSIS — Z23 Encounter for immunization: Secondary | ICD-10-CM | POA: Diagnosis not present

## 2023-10-27 NOTE — Progress Notes (Signed)
   Subjective:    Patient ID: Tamara [name], female    DOB: [birth year], 78 y.o.   MRN: [id number]  HPI  ROV 10/27/2023 --78 year old woman with a history of COPD and chronic cough, recurrent pneumonias, abnormal CT scan of the chest that we had followed with some stability for her left lower lobe area of rounded atelectasis.  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  She was considered for neoadjuvant treatment with the hope that she can be a candidate for surgical resection.  It sounds like she has decided that she does not want any chemotherapy but was consider radiation therapy with a palliative approach. Ultimately she decided against XRT. She sometimes has some L sided sharp pain. She is on stiolto, uses albuterol  about 2x a day. She is considering flu shot, interested in getting today.    Review of Systems As per HPI     Objective:   Physical Exam Vitals:   10/27/23 1015  BP: 130/66  Pulse: (!) 101  Temp: 98.9 F (37.2 C)  TempSrc: Oral  SpO2: 95%  Weight: 224 lb 12.8 oz (102 kg)  Height: 5' 6 (1.676 m)    Gen: Pleasant, overweight woman, in no distress,  normal affect  ENT: No lesions,  mouth clear,  oropharynx clear, no postnasal drip  Neck: No JVD, no stridor  Lungs: No use of accessory muscles, bilateral inspiratory squeaks, louder on the left  Cardiovascular: RRR, heart sounds normal, no murmur or gallops, no peripheral edem  Musculoskeletal: No deformities, no cyanosis or clubbing  Neuro: alert, awake, non focal  Skin: Warm, no lesions or rash     Assessment & Plan:  Lung cancer [facility]) Reviewed her biopsy results, imaging.  She was initially recommended for neoadjuvant therapy but does not want chemotherapy.  In the end she does not want palliative radiation therapy either.  We did discuss the fact that her malignancy will slowly progress and she understands.   Anticipate that she will become symptomatic, probably develop bronchitic/pneumonia symptoms (she has a history of this).  We may decide to repeat her imaging going forward just to have some idea about the pace of progression.  Other maneuvers might be beneficial going forward as well including possible PleurX for symptom management if she develops an effusion, etc.  I will continue to follow her for these issues.  COPD (chronic obstructive pulmonary disease) (HCC) She has a history of frequent bronchitic symptoms, pneumonias.  She will call me if she develops any evidence for this so we can treat early.  Please continue your Stiolto 2 puffs once daily as you have been taking it. Keep albuterol  available to use either 2 puffs or 1 nebulizer treatment when you needed for shortness of breath, chest tightness, wheezing. Flu shot today   Time spent 30 minutes discussing her lung cancer, options for treatment, her decision making regarding treatment deferral, the benefits of possible future imaging for prognostic information and planning.   Lamar Chris, MD, PhD 10/27/2023, 10:35 AM [facility] [phone] or if no answer before 7:00PM call [phone] For any issues after 7:00PM please call eLink [phone]

## 2023-10-27 NOTE — Assessment & Plan Note (Signed)
 She has a history of frequent bronchitic symptoms, pneumonias.  She will call me if she develops any evidence for this so we can treat early.  Please continue your Stiolto 2 puffs once daily as you have been taking it. Keep albuterol  available to use either 2 puffs or 1 nebulizer treatment when you needed for shortness of breath, chest tightness, wheezing. Flu shot today

## 2023-10-27 NOTE — Assessment & Plan Note (Signed)
 Reviewed her biopsy results, imaging.  She was initially recommended for neoadjuvant therapy but does not want chemotherapy.  In the end she does not want palliative radiation therapy either.  We did discuss the fact that her malignancy will slowly progress and she understands.  Anticipate that she will become symptomatic, probably develop bronchitic/pneumonia symptoms (she has a history of this).  We may decide to repeat her imaging going forward just to have some idea about the pace of progression.  Other maneuvers might be beneficial going forward as well including possible PleurX for symptom management if she develops an effusion, etc.  I will continue to follow her for these issues.

## 2023-10-27 NOTE — Addendum Note (Signed)
 Addended by: ROLANDA POWELL SAILOR on: 10/27/2023 11:48 AM   Modules accepted: Orders

## 2023-10-27 NOTE — Progress Notes (Signed)
 Tamara [name]                                          MRN: [id number]   10/27/2023   The VBCI Quality Team Specialist reviewed this patient medical record for the purposes of chart review for care gap closure. The following were reviewed: abstraction for care gap closure-controlling blood pressure.    VBCI Quality Team

## 2023-10-27 NOTE — Patient Instructions (Signed)
 Please continue your Stiolto 2 puffs once daily as you have been taking it. Keep albuterol  available to use either 2 puffs or 1 nebulizer treatment when you needed for shortness of breath, chest tightness, wheezing. Flu shot today We reviewed your lung biopsy results, imaging and your discussions with oncology and radiation oncology.  Support your decision to defer treatment of your lung cancer.  We may decide in the future to repeat your chest imaging so we have information about how the lung cancer is changing over time.  This might be useful information even if we decide not to start treatment. Follow Dr. Shelah in 6 months.  Please call sooner if you develop any new respiratory symptoms.

## 2023-10-28 ENCOUNTER — Ambulatory Visit: Admitting: Emergency Medicine

## 2023-10-31 ENCOUNTER — Other Ambulatory Visit: Payer: Self-pay | Admitting: Internal Medicine

## 2023-10-31 DIAGNOSIS — M545 Low back pain, unspecified: Secondary | ICD-10-CM

## 2023-11-01 NOTE — Telephone Encounter (Signed)
 Requested Prescriptions  Refused Prescriptions Disp Refills   Baclofen  5 MG TABS [Pharmacy Med Name: baclofen  5 mg tablet] 30 tablet 2    Sig: TAKE ONE TABLET BY MOUTH AT BEDTIME FOR MUSCLE SPASMS     Analgesics:  Muscle Relaxants - baclofen  Failed - 11/01/2023  4:12 PM      Failed - Cr in normal range and within 180 days    Creatinine  Date Value Ref Range Status  08/23/2023 1.39 (H) 0.44 - 1.00 mg/dL Final   Creat  Date Value Ref Range Status  10/27/2022 1.02 (H) 0.60 - 1.00 mg/dL Final   Creatinine, Urine  Date Value Ref Range Status  10/03/2023 61 20 - 275 mg/dL Final         Passed - eGFR is 30 or above and within 180 days    GFR, Est African American  Date Value Ref Range Status  11/15/2019 73 > OR = 60 mL/min/1.76m2 Final   GFR, Est Non African American  Date Value Ref Range Status  11/15/2019 63 > OR = 60 mL/min/1.59m2 Final   GFR, Estimated  Date Value Ref Range Status  08/23/2023 39 (L) >60 mL/min Final    Comment:    (NOTE) Calculated using the CKD-EPI Creatinine Equation (2021)    GFR  Date Value Ref Range Status  09/14/2022 40.88 (L) >60.00 mL/min Final    Comment:    Calculated using the CKD-EPI Creatinine Equation (2021)   eGFR  Date Value Ref Range Status  10/27/2022 57 (L) > OR = 60 mL/min/1.72m2 Final  05/27/2022 50 (L) >59 mL/min/1.73 Final         Passed - Valid encounter within last 6 months    Recent Outpatient Visits           2 weeks ago Non-recurrent acute suppurative otitis media of left ear without spontaneous rupture of tympanic membrane   [facility] Gareth Mliss FALCON, FNP   2 months ago 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   [facility] Bernardo Fend, DO   3 months ago Acute bilateral low back pain without sciatica   [facility] Gareth Mliss FALCON, FNP       Future Appointments             In 2 months Trixie File, MD [facility]

## 2023-11-04 ENCOUNTER — Other Ambulatory Visit: Payer: Self-pay | Admitting: Internal Medicine

## 2023-11-04 DIAGNOSIS — I2510 Atherosclerotic heart disease of native coronary artery without angina pectoris: Secondary | ICD-10-CM

## 2023-11-07 NOTE — Telephone Encounter (Signed)
 Requested medication (s) are due for refill today: na  Requested medication (s) are on the active medication list: no  Last refill:  na  Future visit scheduled: yes 11/15/23  Notes to clinic:  medication not on medication list. Do you want to order OTC medication?     Requested Prescriptions  Pending Prescriptions Disp Refills   docusate sodium (COLACE) 100 MG capsule [Pharmacy Med Name: docusate sodium 100 mg capsule] 30 capsule 3    Sig: TAKE ONE CAPSULE BY MOUTH ONCE DAILY     Over the Counter:  OTC Passed - 11/07/2023  3:19 PM      Passed - Valid encounter within last 12 months    Recent Outpatient Visits           3 weeks ago Non-recurrent acute suppurative otitis media of left ear without spontaneous rupture of tympanic membrane   [facility] Gareth Mliss FALCON, FNP   2 months ago 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   [facility] Bernardo Fend, DO   3 months ago Acute bilateral low back pain without sciatica   [facility] Gareth Mliss FALCON, [place]

## 2023-11-07 NOTE — Telephone Encounter (Signed)
 Requested medication (s) are due for refill today: na   Requested medication (s) are on the active medication list: yes   Last refill:  08/25/23 #30 0 refills  Future visit scheduled: yes 11/15/23  Notes to clinic:  no refills remain. Do you want to refill Rx? Pharmacy requesting #[phone] refills     Requested Prescriptions  Pending Prescriptions Disp Refills   valsartan  (DIOVAN ) 80 MG tablet [Pharmacy Med Name: VALSARTAN  80 MG Oral Tablet] 90 tablet 3    Sig: TAKE 1 TABLET EVERY DAY     Cardiovascular:  Angiotensin Receptor Blockers Failed - 11/07/2023  1:51 PM      Failed - Cr in normal range and within 180 days    Creatinine  Date Value Ref Range Status  08/23/2023 1.39 (H) 0.44 - 1.00 mg/dL Final   Creat  Date Value Ref Range Status  10/27/2022 1.02 (H) 0.60 - 1.00 mg/dL Final   Creatinine, Urine  Date Value Ref Range Status  10/03/2023 61 20 - 275 mg/dL Final         Passed - K in normal range and within 180 days    Potassium  Date Value Ref Range Status  08/23/2023 4.6 3.5 - 5.1 mmol/L Final         Passed - Patient is not pregnant      Passed - Last BP in normal range    BP Readings from Last 1 Encounters:  10/27/23 130/66         Passed - Valid encounter within last 6 months    Recent Outpatient Visits           3 weeks ago Non-recurrent acute suppurative otitis media of left ear without spontaneous rupture of tympanic membrane   [facility] Gareth Mliss FALCON, FNP   2 months ago 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   [facility] Bernardo Fend, DO   3 months ago Acute bilateral low back pain without sciatica   [facility] Gareth Mliss FALCON, [place]              Signed Prescriptions Disp Refills   ezetimibe  (ZETIA ) 10 MG tablet 90 tablet 2    Sig: TAKE 1 TABLET EVERY DAY     Cardiovascular:  Antilipid - Sterol Transport Inhibitors Failed - 11/07/2023  1:51 PM       Failed - Lipid Panel in normal range within the last 12 months    Cholesterol, Total  Date Value Ref Range Status  03/15/2022 140 100 - 199 mg/dL Final   Cholesterol  Date Value Ref Range Status  05/19/2023 149 <200 mg/dL Final   LDL Cholesterol (Calc)  Date Value Ref Range Status  05/19/2023 71 mg/dL (calc) Final    Comment:    Reference range: <100 . Desirable range <100 mg/dL for primary prevention;   <70 mg/dL for patients with CHD or diabetic patients  with > or = 2 CHD risk factors. SABRA LDL-C is now calculated using the Martin-Hopkins  calculation, which is a validated novel method providing  better accuracy than the Friedewald equation in the  estimation of LDL-C.  Gladis APPLETHWAITE et al. SANDREA. 7986;689(80): 2061-2068  (http://education.[employer].com/faq/FAQ164)    LDL Direct  Date Value Ref Range Status  03/15/2022 64 0 - 99 mg/dL Final   HDL  Date Value Ref Range Status  05/19/2023 47 (L) > OR = 50 mg/dL Final  97/73/7975 50 >60  mg/dL Final   Triglycerides  Date Value Ref Range Status  05/19/2023 216 (H) <150 mg/dL Final    Comment:    . If a non-fasting specimen was collected, consider repeat triglyceride testing on a fasting specimen if clinically indicated.  Veatrice et al. J. of Clin. Lipidol. 2015;9:129-169. SABRA          Passed - AST in normal range and within 360 days    AST  Date Value Ref Range Status  08/23/2023 16 15 - 41 U/L Final         Passed - ALT in normal range and within 360 days    ALT  Date Value Ref Range Status  08/23/2023 9 0 - 44 U/L Final         Passed - Patient is not pregnant      Passed - Valid encounter within last 12 months    Recent Outpatient Visits           3 weeks ago Non-recurrent acute suppurative otitis media of left ear without spontaneous rupture of tympanic membrane   [facility] Gareth Clarity F, FNP   2 months ago 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   [facility] Bernardo Fend, DO   3 months ago Acute bilateral low back pain without sciatica   [facility] Gareth Clarity FAL[place], [place]

## 2023-11-07 NOTE — Telephone Encounter (Signed)
 Requested by interface surescripts. Future visit 11/15/23.  Requested Prescriptions  Pending Prescriptions Disp Refills   valsartan  (DIOVAN ) 80 MG tablet [Pharmacy Med Name: VALSARTAN  80 MG Oral Tablet] 90 tablet 3    Sig: TAKE 1 TABLET EVERY DAY     Cardiovascular:  Angiotensin Receptor Blockers Failed - 11/07/2023  1:51 PM      Failed - Cr in normal range and within 180 days    Creatinine  Date Value Ref Range Status  08/23/2023 1.39 (H) 0.44 - 1.00 mg/dL Final   Creat  Date Value Ref Range Status  10/27/2022 1.02 (H) 0.60 - 1.00 mg/dL Final   Creatinine, Urine  Date Value Ref Range Status  10/03/2023 61 20 - 275 mg/dL Final         Passed - K in normal range and within 180 days    Potassium  Date Value Ref Range Status  08/23/2023 4.6 3.5 - 5.1 mmol/L Final         Passed - Patient is not pregnant      Passed - Last BP in normal range    BP Readings from Last 1 Encounters:  10/27/23 130/66         Passed - Valid encounter within last 6 months    Recent Outpatient Visits           3 weeks ago Non-recurrent acute suppurative otitis media of left ear without spontaneous rupture of tympanic membrane   [facility] Gareth Mliss FALCON, FNP   2 months ago 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   [facility] Bernardo Fend, DO   3 months ago Acute bilateral low back pain without sciatica   [facility] Gareth Mliss FALCON, FNP               ezetimibe  (ZETIA ) 10 MG tablet [Pharmacy Med Name: EZETIMIBE  10 MG Oral Tablet] 90 tablet 2    Sig: TAKE 1 TABLET EVERY DAY     Cardiovascular:  Antilipid - Sterol Transport Inhibitors Failed - 11/07/2023  1:51 PM      Failed - Lipid Panel in normal range within the last 12 months    Cholesterol, Total  Date Value Ref Range Status  03/15/2022 140 100 - 199 mg/dL Final   Cholesterol  Date Value Ref Range Status  05/19/2023 149 <200 mg/dL Final    LDL Cholesterol (Calc)  Date Value Ref Range Status  05/19/2023 71 mg/dL (calc) Final    Comment:    Reference range: <100 . Desirable range <100 mg/dL for primary prevention;   <70 mg/dL for patients with CHD or diabetic patients  with > or = 2 CHD risk factors. SABRA LDL-C is now calculated using the Martin-Hopkins  calculation, which is a validated novel method providing  better accuracy than the Friedewald equation in the  estimation of LDL-C.  Gladis APPLETHWAITE et al. SANDREA. 7986;689(80): 2061-2068  (http://education.[facility].com/faq/FAQ164)    LDL Direct  Date Value Ref Range Status  03/15/2022 64 0 - 99 mg/dL Final   HDL  Date Value Ref Range Status  05/19/2023 47 (L) > OR = 50 mg/dL Final  97/73/7975 50 >60 mg/dL Final   Triglycerides  Date Value Ref Range Status  05/19/2023 216 (H) <150 mg/dL Final    Comment:    . If a non-fasting specimen was collected, consider repeat triglyceride testing on a fasting specimen if clinically indicated.  Veatrice et al. JINNY.  of Clin. Lipidol. 2015;9:129-169. SABRA          Passed - AST in normal range and within 360 days    AST  Date Value Ref Range Status  08/23/2023 16 15 - 41 U/L Final         Passed - ALT in normal range and within 360 days    ALT  Date Value Ref Range Status  08/23/2023 9 0 - 44 U/L Final         Passed - Patient is not pregnant      Passed - Valid encounter within last 12 months    Recent Outpatient Visits           3 weeks ago Non-recurrent acute suppurative otitis media of left ear without spontaneous rupture of tympanic membrane   [facility] Gareth Clarity F, FNP   2 months ago 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   [facility] Bernardo Fend, DO   3 months ago Acute bilateral low back pain without sciatica   [facility] Gareth Clarity FALCON, [place]

## 2023-11-08 ENCOUNTER — Other Ambulatory Visit: Payer: Self-pay | Admitting: Internal Medicine

## 2023-11-08 DIAGNOSIS — R600 Localized edema: Secondary | ICD-10-CM

## 2023-11-10 ENCOUNTER — Other Ambulatory Visit: Payer: Self-pay

## 2023-11-10 DIAGNOSIS — E119 Type 2 diabetes mellitus without complications: Secondary | ICD-10-CM

## 2023-11-10 DIAGNOSIS — E1159 Type 2 diabetes mellitus with other circulatory complications: Secondary | ICD-10-CM

## 2023-11-10 MED ORDER — ACCU-CHEK SOFTCLIX LANCETS MISC: 3 refills | Status: AC

## 2023-11-10 MED ORDER — TRUE METRIX BLOOD GLUCOSE TEST VI STRP: ORAL_STRIP | 3 refills | Status: AC

## 2023-11-10 NOTE — Telephone Encounter (Signed)
 Requested Prescriptions  Pending Prescriptions Disp Refills   furosemide  (LASIX ) 40 MG tablet [Pharmacy Med Name: FUROSEMIDE  40 MG Oral Tablet] 90 tablet 1    Sig: TAKE 1 TABLET EVERY DAY     Cardiovascular:  Diuretics - Loop Failed - 11/10/2023 12:30 PM      Failed - Cr in normal range and within 180 days    Creatinine  Date Value Ref Range Status  08/23/2023 1.39 (H) 0.44 - 1.00 mg/dL Final   Creat  Date Value Ref Range Status  10/27/2022 1.02 (H) 0.60 - 1.00 mg/dL Final   Creatinine, Urine  Date Value Ref Range Status  10/03/2023 61 20 - 275 mg/dL Final         Failed - Mg Level in normal range and within 180 days    Magnesium   Date Value Ref Range Status  10/27/2022 1.2 (L) 1.5 - 2.5 mg/dL Final         Passed - K in normal range and within 180 days    Potassium  Date Value Ref Range Status  08/23/2023 4.6 3.5 - 5.1 mmol/L Final         Passed - Ca in normal range and within 180 days    Calcium   Date Value Ref Range Status  08/23/2023 10.1 8.9 - 10.3 mg/dL Final         Passed - Na in normal range and within 180 days    Sodium  Date Value Ref Range Status  08/23/2023 137 135 - 145 mmol/L Final  05/27/2022 140 134 - 144 mmol/L Final         Passed - Cl in normal range and within 180 days    Chloride  Date Value Ref Range Status  08/23/2023 105 98 - 111 mmol/L Final         Passed - Last BP in normal range    BP Readings from Last 1 Encounters:  10/27/23 130/66         Passed - Valid encounter within last 6 months    Recent Outpatient Visits           4 weeks ago Non-recurrent acute suppurative otitis media of left ear without spontaneous rupture of tympanic membrane   [facility] Gareth Mliss FALCON, FNP   2 months ago 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   [facility] Bernardo Fend, DO   3 months ago Acute bilateral low back pain without sciatica   [facility]  Gareth Mliss FALCON, FNP

## 2023-11-15 ENCOUNTER — Other Ambulatory Visit: Payer: Self-pay

## 2023-11-15 ENCOUNTER — Ambulatory Visit (INDEPENDENT_AMBULATORY_CARE_PROVIDER_SITE_OTHER): Admitting: Internal Medicine

## 2023-11-15 ENCOUNTER — Encounter: Payer: Self-pay | Admitting: Internal Medicine

## 2023-11-15 VITALS — BP 136/76 | HR 97 | Temp 98.2°F | Resp 18 | Ht 66.0 in | Wt 226.2 lb

## 2023-11-15 DIAGNOSIS — E079 Disorder of thyroid, unspecified: Secondary | ICD-10-CM

## 2023-11-15 DIAGNOSIS — H6121 Impacted cerumen, right ear: Secondary | ICD-10-CM

## 2023-11-15 DIAGNOSIS — E785 Hyperlipidemia, unspecified: Secondary | ICD-10-CM

## 2023-11-15 DIAGNOSIS — C3432 Malignant neoplasm of lower lobe, left bronchus or lung: Secondary | ICD-10-CM

## 2023-11-15 DIAGNOSIS — Z23 Encounter for immunization: Secondary | ICD-10-CM | POA: Diagnosis not present

## 2023-11-15 DIAGNOSIS — I10 Essential (primary) hypertension: Secondary | ICD-10-CM | POA: Diagnosis not present

## 2023-11-15 DIAGNOSIS — G893 Neoplasm related pain (acute) (chronic): Secondary | ICD-10-CM

## 2023-11-15 MED ORDER — VALSARTAN 80 MG PO TABS: 80.0000 mg | ORAL_TABLET | Freq: Every day | ORAL | 1 refills | Status: AC

## 2023-11-15 MED ORDER — TRAMADOL HCL 50 MG PO TABS: 50.0000 mg | ORAL_TABLET | Freq: Every day | ORAL | 2 refills | Status: AC | PRN

## 2023-11-15 MED ORDER — HYDROCODONE BIT-HOMATROP MBR 5-1.5 MG/5ML PO SOLN: 5.0000 mL | Freq: Two times a day (BID) | ORAL | 0 refills | Status: AC | PRN

## 2023-11-15 MED ORDER — LEVOTHYROXINE SODIUM 50 MCG PO TABS: 50.0000 ug | ORAL_TABLET | Freq: Every day | ORAL | 1 refills | Status: AC

## 2023-11-15 NOTE — Progress Notes (Signed)
 Established Patient Office Visit  Subjective:  Patient ID: Tamara [name], female    DOB: Sep 29, 1945  Age: 78 y.o. MRN: [id number]  CC:  Chief Complaint  Patient presents with   Medical Management of Chronic Issues    3 month recheck    HPI Tamara HUNEKE presents for follow up on chronic medical conditions.  Discussed the use of AI scribe software for clinical note transcription with the patient, who gave verbal consent to proceed.  History of Present Illness Tamara [name] is a 78 year old female who presents with ear pain and possible ear infection.  She experiences ear pain, initially in the left ear and now in the right ear, with possible involvement in both ears. There are no changes in hearing, tinnitus, or otorrhea. Approximately one month ago, she was prescribed Zithromax  for an ear infection in the left ear, which she feels did not resolve her symptoms.  She has sinus problems and describes a sensation of 'choking' and feeling 'underwater' or like being in an airplane, possibly related to sinus pressure changes. She has ear wax buildup, particularly in the right ear, which she believes affects her hearing and symptoms.  She has pulmonary issues and difficulty accessing her pulmonologist until January. She has a history of pneumonia and is concerned about respiratory infections, especially with the flu season. She received a flu shot and had a pneumonia vaccine in 2021.  She requests refills for tramadol  for pain, cough syrup for nighttime coughing, and valsartan  for blood pressure. She also takes hydrochlorothiazide , Crestor , pantoprazole , and levothyroxine . She does not take tramadol  and cough syrup together. She experiences nighttime coughing that disrupts her sleep.   Hypertension: -Medications: Valsartan  80 mg, Lasix  40 mg PRN for swelling (taking about once or twice a week).  Complaining of worsening swelling in her bilateral lower extremities today but cannot  take Lasix  daily due to frequent urination -Had been on hydrochlorothiazide  previously but discontinued while in the hospital -Patient is compliant with above medications and reports no side effects. -Checking BP at home (average): doesn't check  -Denies any SOB, CP, vision changes, LE edema or symptoms of hypotension  COPD: -COPD status: stable -Current medications: Stiolto Respimat , albuterol  PRN -Satisfied with current treatment?: yes -Oxygen use: no -Dyspnea frequency: with exertion -Cough frequency: yes daily -Rescue inhaler frequency: daily -Limitation of activity: yes -Productive cough: no -Pneumovax: Not up to Date -Influenza: Up to Date -Following with pulmonology  HLD: -Medications: Crestor  20 mg, Zetia  10 mg - hesitant to increase statin, had not been able to tolerate 40 mg in the past due to joint and muscle pain. -Patient is compliant with above medication. She does have tolerable joint pains with the medication and would prefer not to increase it. -Last lipid panel: Lipid Panel     Component Value Date/Time   CHOL 149 05/19/2023 1424   CHOL 140 03/15/2022 1351   TRIG 216 (H) 05/19/2023 1424   HDL 47 (L) 05/19/2023 1424   HDL 50 03/15/2022 1351   CHOLHDL 3.2 05/19/2023 1424   VLDL 29 08/23/2016 0825   LDLCALC 71 05/19/2023 1424   LDLDIRECT 64 03/15/2022 1351   LABVLDL 25 03/15/2022 1351   Diabetes, Type 2: -Last A1c 7.7% 5/25 -Medications: Novolin  N 40 units in the am, 30 units in the pm, Metformin  1000 mg BID -Patient is compliant with the above medications and reports no side effects.  -Following with endocrinology -Statin: yes -PNA vaccine: Prevnar 20 due -Denies symptoms  of hypoglycemia, polyuria, polydipsia, numbness extremities, foot ulcers/trauma.   Hypothyroidism: -Medications: Levothyroxine  50 mcg -Patient is compliant with the above medication (s) at the above dose and reports no medication side effects.  -Denies weight changes, cold./heat  intolerance, skin changes, anxiety/palpitations  -Last TSH: 3.24 9/25  Health Maintenance: -Blood work UTD    Past Medical History:  Diagnosis Date   Anemia    Arthritis    knee and shoulders   Colon polyps 09/05/2017   COPD (chronic obstructive pulmonary disease) (HCC)    Diabetes mellitus without complication (HCC)    type II   Dyspnea    05/16/20 has had a cough for 2.5 years post Covid   Gastritis 09/05/2017   GERD (gastroesophageal reflux disease)    Hyperlipidemia    Hypertension    patient denies   Hypothyroidism    Panic attack    Pneumonia    RUQ pain 03/09/2017   Per patient, she has had RUQ pain 4-5 years that has grown worse in the last few months.    Past Surgical History:  Procedure Laterality Date   BREAST BIOPSY Left 2008   CORE W/CLIP - NEG   BRONCHIAL BIOPSY  05/19/2020   Procedure: BRONCHIAL BIOPSIES;  Surgeon: Shelah Lamar RAMAN, MD;  Location: [facility] ENDOSCOPY;  Service: Pulmonary;;   BRONCHIAL BIOPSY  08/01/2023   Procedure: BRONCHOSCOPY, WITH BIOPSY;  Surgeon: Shelah Lamar RAMAN, MD;  Location: [facility] ENDOSCOPY;  Service: Pulmonary;;   BRONCHIAL BRUSHINGS  05/19/2020   Procedure: BRONCHIAL BRUSHINGS;  Surgeon: Shelah Lamar RAMAN, MD;  Location: [facility] ENDOSCOPY;  Service: Pulmonary;;   BRONCHIAL BRUSHINGS  08/01/2023   Procedure: BRONCHOSCOPY, WITH BRUSH BIOPSY;  Surgeon: Shelah Lamar RAMAN, MD;  Location: [facility] ENDOSCOPY;  Service: Pulmonary;;   BRONCHIAL NEEDLE ASPIRATION BIOPSY  05/19/2020   Procedure: BRONCHIAL NEEDLE ASPIRATION BIOPSIES;  Surgeon: Shelah Lamar RAMAN, MD;  Location: [facility] ENDOSCOPY;  Service: Pulmonary;;   BRONCHIAL NEEDLE ASPIRATION BIOPSY  08/01/2023   Procedure: BRONCHOSCOPY, WITH NEEDLE ASPIRATION BIOPSY;  Surgeon: Shelah Lamar RAMAN, MD;  Location: [facility] ENDOSCOPY;  Service: Pulmonary;;   BRONCHIAL WASHINGS  05/19/2020   Procedure: BRONCHIAL WASHINGS;  Surgeon: Shelah Lamar RAMAN, MD;  Location: [facility] ENDOSCOPY;  Service: Pulmonary;;   BRONCHIAL WASHINGS   08/01/2023   Procedure: IRRIGATION, BRONCHUS;  Surgeon: Shelah Lamar RAMAN, MD;  Location: [facility] ENDOSCOPY;  Service: Pulmonary;;   BRONCHOSCOPY, WITH BIOPSY USING ELECTROMAGNETIC NAVIGATION Left 08/01/2023   Procedure: BRONCHOSCOPY, WITH BIOPSY USING ELECTROMAGNETIC NAVIGATION;  Surgeon: Shelah Lamar RAMAN, MD;  Location: [facility] ENDOSCOPY;  Service: Pulmonary;  Laterality: Left;   COLONOSCOPY WITH PROPOFOL  N/A 02/08/2017   Procedure: COLONOSCOPY WITH PROPOFOL ;  Surgeon: Gaylyn Gladis PENNER, MD;  Location: [facility] ENDOSCOPY;  Service: Endoscopy;  Laterality: N/A;   COLONOSCOPY WITH PROPOFOL  N/A 05/05/2022   Procedure: COLONOSCOPY WITH PROPOFOL ;  Surgeon: Unk Corinn Skiff, MD;  Location: [facility] ENDOSCOPY;  Service: Gastroenterology;  Laterality: N/A;   ESOPHAGOGASTRODUODENOSCOPY (EGD) WITH PROPOFOL  N/A 02/08/2017   Procedure: ESOPHAGOGASTRODUODENOSCOPY (EGD) WITH PROPOFOL ;  Surgeon: Gaylyn Gladis PENNER, MD;  Location: [facility] ENDOSCOPY;  Service: Endoscopy;  Laterality: N/A;   TUBAL LIGATION     VIDEO BRONCHOSCOPY WITH ENDOBRONCHIAL NAVIGATION N/A 05/19/2020   Procedure: VIDEO BRONCHOSCOPY WITH ENDOBRONCHIAL NAVIGATION;  Surgeon: Shelah Lamar RAMAN, MD;  Location: [facility] ENDOSCOPY;  Service: Pulmonary;  Laterality: N/A;    Family History  Problem Relation Age of Onset   Throat cancer Father    Diabetes Maternal Grandmother    Skin cancer Maternal Grandmother  Breast cancer Neg Hx     Social History   Socioeconomic History   Marital status: Married    Spouse name: Abriana Saltos   Number of children: 3   Years of education: Not on file   Highest education level: High school graduate  Occupational History   Occupation: retired  Tobacco Use   Smoking status: Former    Current packs/day: 0.00    Average packs/day: 1 pack/day for 40.0 years (40.0 ttl pk-yrs)    Types: Cigarettes    Start date: 1966    Quit date: 2006    Years since quitting: 19.8   Smokeless tobacco: Never   Tobacco comments:    Smoking  cessation materials not required  Vaping Use   Vaping status: Never Used  Substance and Sexual Activity   Alcohol use: No   Drug use: No   Sexual activity: Not on file  Other Topics Concern   Not on file  Social History Narrative   Not on file   Social Drivers of Health   Financial Resource Strain: Low Risk  (12/02/2022)   Overall Financial Resource Strain (CARDIA)    Difficulty of Paying Living Expenses: Not hard at all  Food Insecurity: Food Insecurity Present (08/25/2023)   Hunger Vital Sign    Worried About Running Out of Food in the Last Year: Sometimes true    Ran Out of Food in the Last Year: Often true  Transportation Needs: No Transportation Needs (08/25/2023)   PRAPARE - Administrator, Civil Service (Medical): No    Lack of Transportation (Non-Medical): No  Physical Activity: Insufficiently Active (12/02/2022)   Exercise Vital Sign    Days of Exercise per Week: 3 days    Minutes of Exercise per Session: 30 min  Stress: No Stress Concern Present (12/02/2022)   [employer] of Occupational Health - Occupational Stress Questionnaire    Feeling of Stress : Not at all  Social Connections: Moderately Isolated (12/02/2022)   Social Connection and Isolation Panel    Frequency of Communication with Friends and Family: More than three times a week    Frequency of Social Gatherings with Friends and Family: Once a week    Attends Religious Services: Never    Database Administrator or Organizations: No    Attends Banker Meetings: Never    Marital Status: Married  Catering Manager Violence: Not At Risk (08/25/2023)   Humiliation, Afraid, Rape, and Kick questionnaire    Fear of Current or Ex-Partner: No    Emotionally Abused: No    Physically Abused: No    Sexually Abused: No    Outpatient Medications Prior to Visit  Medication Sig Dispense Refill   albuterol  (PROVENTIL ) (2.5 MG/3ML) 0.083% nebulizer solution Take 3 mLs (2.5 mg total) by  nebulization every 6 (six) hours as needed for wheezing or shortness of breath. 180 mL 5   albuterol  (VENTOLIN  HFA) 108 (90 Base) MCG/ACT inhaler Inhale 2 puffs into the lungs every 6 (six) hours as needed for wheezing or shortness of breath. 8 g 6   Ascorbic Acid  (VITAMIN C ) 1000 MG tablet Take 1,000 mg by mouth daily.     aspirin  EC 81 MG tablet Take 1 tablet (81 mg total) by mouth daily. Swallow whole. 90 tablet 3   Baclofen  5 MG TABS Take 1 tablet (5 mg total) by mouth at bedtime as needed (msk pain or spasms). 90 tablet 1   Cholecalciferol  (VITAMIN D ) 50 MCG (2000  UT) CAPS Take 1,000 Units by mouth daily.     ezetimibe  (ZETIA ) 10 MG tablet TAKE 1 TABLET EVERY DAY 90 tablet 2   Ferrous Fumarate (HEMOCYTE - 106 MG FE) 324 (106 Fe) MG TABS tablet Take 1 tablet by mouth daily.     Ferrous Fumarate-DSS (FE CAPS/STOOL SOFTENER) 50-100 MG TABS Take 50-100 mg by mouth daily. 30 tablet 3   furosemide  (LASIX ) 40 MG tablet TAKE 1 TABLET EVERY DAY 90 tablet 1   hydrochlorothiazide  (MICROZIDE ) 12.5 MG capsule Take 1 capsule (12.5 mg total) by mouth daily. 90 capsule 1   HYDROcodone  bit-homatropine (HYCODAN) 5-1.5 MG/5ML syrup Take 5 mLs by mouth every 8 (eight) hours as needed for cough. 120 mL 0   Insulin  NPH, Human,, Isophane, (NOVOLIN  N FLEXPEN) 100 UNIT/ML Kiwkpen INJECT 40 UNITS UNDER THE SKIN IN THE MORNING AND 30 TO 30 UNITS AT BEDTIME 75 mL 3   levothyroxine  (SYNTHROID ) 50 MCG tablet TAKE 1 TABLET EVERY DAY BEFORE BREAKFAST 90 tablet 3   metFORMIN  (GLUCOPHAGE ) 1000 MG tablet TAKE 1 TABLET TWICE DAILY 180 tablet 3   metoCLOPramide  (REGLAN ) 10 MG tablet Take 1 tablet (10 mg total) by mouth every 6 (six) hours as needed. 30 tablet 0   nystatin  (MYCOSTATIN /NYSTOP ) powder Apply 1 Application topically daily as needed (yeast under belly). 60 g 2   pantoprazole  (PROTONIX ) 40 MG tablet TAKE 1 TABLET TWICE DAILY 180 tablet 3   potassium chloride  (KLOR-CON  M) 10 MEQ tablet Take 1 tablet (10 mEq total) by  mouth daily. 90 tablet 0   rosuvastatin  (CRESTOR ) 20 MG tablet Take 1 tablet (20 mg total) by mouth at bedtime. 90 tablet 1   sucralfate  (CARAFATE ) 1 g tablet Take 1 tablet (1 g total) by mouth 4 (four) times daily. 120 tablet 1   Tiotropium Bromide -Olodaterol (STIOLTO RESPIMAT ) 2.5-2.5 MCG/ACT AERS Inhale 2 puffs into the lungs daily. 4 g 11   traMADol  (ULTRAM ) 50 MG tablet Take 1 tablet (50 mg total) by mouth daily as needed for severe pain (pain score 7-10). 30 tablet 2   TRUE METRIX BLOOD GLUCOSE TEST test strip Use 2x a day 200 each 3   valsartan  (DIOVAN ) 80 MG tablet Take 1 tablet (80 mg total) by mouth daily. 30 tablet 0   Accu-Chek Softclix Lancets lancets Use as instructed 200 each 3   Alcohol Swabs (DROPSAFE ALCOHOL PREP) 70 % PADS USE AS DIRECTED TWO TIMES DAILY WHEN TESTING BLOOD SUGAR 200 each 3   Blood Glucose Monitoring Suppl (TRUE METRIX AIR GLUCOSE METER) w/Device KIT USE AS DIRECTED 1 kit 3   DROPLET PEN NEEDLES 32G X 4 MM MISC USE AS DIRECTED 200 each 3   fluconazole  (DIFLUCAN ) 150 MG tablet Take 1 tablet (150 mg total) by mouth every 3 (three) days as needed (for vaginal itching/yeast infection sx). (Patient not taking: Reported on 11/15/2023) 2 tablet 0   No facility-administered medications prior to visit.    Allergies  Allergen Reactions   Penicillins Hives    Tolerated rocephin  09/2022 Reaction: 1964    ROS Review of Systems  HENT:  Positive for ear pain. Negative for ear discharge, hearing loss and tinnitus.   Respiratory:  Positive for wheezing.   All other systems reviewed and are negative.     Objective:    Physical Exam Constitutional:      Appearance: Normal appearance.  HENT:     Head: Normocephalic and atraumatic.     Right Ear: Tympanic membrane, ear canal and  external ear normal. There is impacted cerumen.     Left Ear: Tympanic membrane, ear canal and external ear normal.  Eyes:     Conjunctiva/sclera: Conjunctivae normal.  Cardiovascular:      Rate and Rhythm: Normal rate and regular rhythm.  Pulmonary:     Effort: Pulmonary effort is normal.     Breath sounds: Wheezing present. No rhonchi or rales.  Skin:    General: Skin is warm and dry.  Neurological:     General: No focal deficit present.     Mental Status: She is alert. Mental status is at baseline.     Cranial Nerves: No cranial nerve deficit.  Psychiatric:        Mood and Affect: Mood normal.        Behavior: Behavior normal.     BP 136/76 (Cuff Size: Large)   Pulse 97   Temp 98.2 F (36.8 C) (Oral)   Resp 18   Ht 5' 6 (1.676 m)   Wt 226 lb 3.2 oz (102.6 kg)   SpO2 98%   BMI 36.51 kg/m  Wt Readings from Last 3 Encounters:  11/15/23 226 lb 3.2 oz (102.6 kg)  10/27/23 224 lb 12.8 oz (102 kg)  10/13/23 223 lb 1.6 oz (101.2 kg)     Health Maintenance Due  Topic Date Due   Zoster Vaccines- Shingrix (1 of 2) Never done   Mammogram  03/08/2019   OPHTHALMOLOGY EXAM  10/07/2022   Medicare Annual Wellness (AWV)  12/02/2023    There are no preventive care reminders to display for this patient.  Lab Results  Component Value Date   TSH 3.24 10/03/2023   Lab Results  Component Value Date   WBC 7.3 08/23/2023   HGB 8.3 (L) 08/23/2023   HCT 26.3 (L) 08/23/2023   MCV 75.8 (L) 08/23/2023   PLT 206 08/23/2023   Lab Results  Component Value Date   NA 137 08/23/2023   K 4.6 08/23/2023   CO2 22 08/23/2023   GLUCOSE 116 (H) 08/23/2023   BUN 23 08/23/2023   CREATININE 1.39 (H) 08/23/2023   BILITOT 0.3 08/23/2023   ALKPHOS 50 08/23/2023   AST 16 08/23/2023   ALT 9 08/23/2023   PROT 7.6 08/23/2023   ALBUMIN 4.2 08/23/2023   CALCIUM  10.1 08/23/2023   ANIONGAP 10 08/23/2023   EGFR 57 (L) 10/27/2022   GFR 40.88 (L) 09/14/2022   Lab Results  Component Value Date   CHOL 149 05/19/2023   Lab Results  Component Value Date   HDL 47 (L) 05/19/2023   Lab Results  Component Value Date   LDLCALC 71 05/19/2023   Lab Results  Component Value Date    TRIG 216 (H) 05/19/2023   Lab Results  Component Value Date   CHOLHDL 3.2 05/19/2023   Lab Results  Component Value Date   HGBA1C 6.7 (A) 09/22/2023      Assessment & Plan:   Assessment & Plan Bilateral ear pain with right cerumen impaction and middle ear effusion No hearing changes, tinnitus, or otorrhea. Right ear fluid without purulence, suggesting no infection. Left ear cerumen obstructs view, complicating infection assessment. Sinus pressure changes considered. - Clean right ear to remove cerumen impaction. - Consider ear drops to soften wax if needed. - Reassess for infection after cleaning.  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 Difficulty sleeping due to supine coughing. Wheezing noted. Discussed cautious use of cough syrup due to  opioid content. - Refill cough syrup for symptomatic relief, to be used as needed. - Refill tramadol  for pain management, ensuring not to use concurrently with cough syrup.  Hypertension Hypertension managed with valsartan . Reports running out of medication every three months. - Refill valsartan  with a 90-day supply and one refill.  Hypothyroidism Hypothyroidism managed with levothyroxine . Recent labs in September were satisfactory. - Refill levothyroxine .  Dyslipidemia Dyslipidemia managed with Crestor .  General Health Maintenance Discussed importance of vaccinations, particularly updated pneumonia vaccine. Received flu shot recently. Pneumonia vaccine discussed as booster for enhanced protection. - Administer updated pneumonia vaccine.  - HYDROcodone  bit-homatropine (HYCODAN) 5-1.5 MG/5ML syrup; Take 5 mLs by mouth every 12 (twelve) hours as needed for cough.  Dispense: 50 mL; Refill: 0 - traMADol  (ULTRAM ) 50 MG tablet; Take 1 tablet (50 mg total) by mouth daily as needed for severe pain (pain score 7-10).  Dispense: 30 tablet; Refill: 2 - valsartan  (DIOVAN )  80 MG tablet; Take 1 tablet (80 mg total) by mouth daily.  Dispense: 90 tablet; Refill: 1 - levothyroxine  (SYNTHROID ) 50 MCG tablet; Take 1 tablet (50 mcg total) by mouth daily before breakfast.  Dispense: 90 tablet; Refill: 1 - Ear Lavage - Pneumococcal conjugate vaccine 20-valent (Prevnar 20)   Follow-up: Return in about 3 months (around 02/15/2024).    Sharyle Fischer, DO

## 2023-11-21 DIAGNOSIS — Z87891 Personal history of nicotine dependence: Secondary | ICD-10-CM | POA: Diagnosis not present

## 2023-11-21 DIAGNOSIS — I2510 Atherosclerotic heart disease of native coronary artery without angina pectoris: Secondary | ICD-10-CM | POA: Diagnosis not present

## 2023-11-21 DIAGNOSIS — E039 Hypothyroidism, unspecified: Secondary | ICD-10-CM | POA: Diagnosis not present

## 2023-11-21 DIAGNOSIS — E1142 Type 2 diabetes mellitus with diabetic polyneuropathy: Secondary | ICD-10-CM | POA: Diagnosis not present

## 2023-11-21 DIAGNOSIS — Z9981 Dependence on supplemental oxygen: Secondary | ICD-10-CM | POA: Diagnosis not present

## 2023-11-21 DIAGNOSIS — Z8701 Personal history of pneumonia (recurrent): Secondary | ICD-10-CM | POA: Diagnosis not present

## 2023-11-21 DIAGNOSIS — I700 Atherosclerosis of aorta: Secondary | ICD-10-CM | POA: Diagnosis not present

## 2023-11-21 DIAGNOSIS — J439 Emphysema, unspecified: Secondary | ICD-10-CM | POA: Diagnosis not present

## 2023-11-21 DIAGNOSIS — I501 Left ventricular failure: Secondary | ICD-10-CM | POA: Diagnosis not present

## 2023-11-21 DIAGNOSIS — Z794 Long term (current) use of insulin: Secondary | ICD-10-CM | POA: Diagnosis not present

## 2023-11-21 DIAGNOSIS — Z85118 Personal history of other malignant neoplasm of bronchus and lung: Secondary | ICD-10-CM | POA: Diagnosis not present

## 2023-11-21 DIAGNOSIS — M8580 Other specified disorders of bone density and structure, unspecified site: Secondary | ICD-10-CM | POA: Diagnosis not present

## 2023-11-21 DIAGNOSIS — K219 Gastro-esophageal reflux disease without esophagitis: Secondary | ICD-10-CM | POA: Diagnosis not present

## 2023-11-21 DIAGNOSIS — E876 Hypokalemia: Secondary | ICD-10-CM | POA: Diagnosis not present

## 2023-11-21 DIAGNOSIS — Z6835 Body mass index (BMI) 35.0-35.9, adult: Secondary | ICD-10-CM | POA: Diagnosis not present

## 2023-11-21 DIAGNOSIS — Z7982 Long term (current) use of aspirin: Secondary | ICD-10-CM | POA: Diagnosis not present

## 2023-11-21 DIAGNOSIS — J9611 Chronic respiratory failure with hypoxia: Secondary | ICD-10-CM | POA: Diagnosis not present

## 2023-11-21 DIAGNOSIS — G4730 Sleep apnea, unspecified: Secondary | ICD-10-CM | POA: Diagnosis not present

## 2023-11-21 DIAGNOSIS — Z8616 Personal history of COVID-19: Secondary | ICD-10-CM | POA: Diagnosis not present

## 2023-11-21 DIAGNOSIS — M62838 Other muscle spasm: Secondary | ICD-10-CM | POA: Diagnosis not present

## 2023-11-21 DIAGNOSIS — M1990 Unspecified osteoarthritis, unspecified site: Secondary | ICD-10-CM | POA: Diagnosis not present

## 2023-11-21 DIAGNOSIS — N1832 Chronic kidney disease, stage 3b: Secondary | ICD-10-CM | POA: Diagnosis not present

## 2023-11-21 DIAGNOSIS — I130 Hypertensive heart and chronic kidney disease with heart failure and stage 1 through stage 4 chronic kidney disease, or unspecified chronic kidney disease: Secondary | ICD-10-CM | POA: Diagnosis not present

## 2023-11-21 DIAGNOSIS — J849 Interstitial pulmonary disease, unspecified: Secondary | ICD-10-CM | POA: Diagnosis not present

## 2023-11-30 ENCOUNTER — Other Ambulatory Visit: Payer: Self-pay | Admitting: Internal Medicine

## 2023-11-30 DIAGNOSIS — E1165 Type 2 diabetes mellitus with hyperglycemia: Secondary | ICD-10-CM

## 2023-12-05 ENCOUNTER — Other Ambulatory Visit: Payer: Self-pay | Admitting: Emergency Medicine

## 2023-12-05 ENCOUNTER — Telehealth: Payer: Self-pay | Admitting: Pharmacist

## 2023-12-05 NOTE — Progress Notes (Signed)
 Pharmacy Quality Measure Review  This patient is appearing on a report for being at risk of failing the adherence measure for hypertension (ACEi/ARB) medications this calendar year.   Medication: valsartan  80 mg daily Last fill date: 11/17/23 for 90 day supply  Insurance report was not up to date. No action needed at this time.   Catie [facility], PharmD, [facility] Clinical Pharmacist [phone]

## 2023-12-22 ENCOUNTER — Ambulatory Visit: Payer: Self-pay

## 2023-12-22 DIAGNOSIS — Z0000 Encounter for general adult medical examination without abnormal findings: Secondary | ICD-10-CM

## 2023-12-22 DIAGNOSIS — Z1231 Encounter for screening mammogram for malignant neoplasm of breast: Secondary | ICD-10-CM

## 2023-12-22 NOTE — Progress Notes (Signed)
 I connected with  Tamara [name] on 12/22/23 by a audio enabled telemedicine application and verified that I am speaking with the correct person using two identifiers.  Patient Location: Home  Provider Location: Office/Clinic  Persons Participating in Visit: Patient.  I discussed the limitations of evaluation and management by telemedicine. The patient expressed understanding and agreed to proceed.   Vital Signs: Because this visit was a virtual/telehealth visit, some criteria may be missing or patient reported. Any vitals not documented were not able to be obtained and vitals that have been documented are patient reported.      No chief complaint on file.    Subjective:   Tamara [name] is a 78 y.o. female who presents for a Medicare Annual Wellness Visit.  Fall Screening Falls in the past year?: 0 Number of falls in past year: 0 Was there an injury with Fall?: 0 Fall Risk Category Calculator: 0 Patient Fall Risk Level: Moderate fall risk  Advance Directives (For Healthcare) Does Patient Have a Medical Advance Directive?: No Would patient like information on creating a medical advance directive?: No - Patient declined    Allergies (verified) Penicillins   Current Medications (verified) Outpatient Encounter Medications as of 12/22/2023  Medication Sig   Accu-Chek Softclix Lancets lancets Use as instructed   albuterol  (PROVENTIL ) (2.5 MG/3ML) 0.083% nebulizer solution Take 3 mLs (2.5 mg total) by nebulization every 6 (six) hours as needed for wheezing or shortness of breath.   albuterol  (VENTOLIN  HFA) 108 (90 Base) MCG/ACT inhaler Inhale 2 puffs into the lungs every 6 (six) hours as needed for wheezing or shortness of breath.   Alcohol Swabs (DROPSAFE ALCOHOL PREP) 70 % PADS USE AS DIRECTED TWO TIMES DAILY WHEN TESTING BLOOD SUGAR   Ascorbic Acid  (VITAMIN C ) 1000 MG tablet Take 1,000 mg by mouth daily.   aspirin  EC 81 MG tablet Take 1 tablet (81 mg total) by mouth  daily. Swallow whole.   Baclofen  5 MG TABS Take 1 tablet (5 mg total) by mouth at bedtime as needed (msk pain or spasms).   Blood Glucose Monitoring Suppl (TRUE METRIX AIR GLUCOSE METER) w/Device KIT USE AS DIRECTED   Cholecalciferol  (VITAMIN D ) 50 MCG (2000 UT) CAPS Take 1,000 Units by mouth daily.   DROPLET PEN NEEDLES 32G X 4 MM MISC USE AS DIRECTED   ezetimibe  (ZETIA ) 10 MG tablet TAKE 1 TABLET EVERY DAY   Ferrous Fumarate (HEMOCYTE - 106 MG FE) 324 (106 Fe) MG TABS tablet Take 1 tablet by mouth daily.   Ferrous Fumarate-DSS (FE CAPS/STOOL SOFTENER) 50-100 MG TABS Take 50-100 mg by mouth daily.   furosemide  (LASIX ) 40 MG tablet TAKE 1 TABLET EVERY DAY   hydrochlorothiazide  (MICROZIDE ) 12.5 MG capsule Take 1 capsule (12.5 mg total) by mouth daily.   HYDROcodone  bit-homatropine (HYCODAN) 5-1.5 MG/5ML syrup Take 5 mLs by mouth every 12 (twelve) hours as needed for cough.   Insulin  NPH, Human,, Isophane, (NOVOLIN  N FLEXPEN) 100 UNIT/ML Kiwkpen INJECT 40 UNITS UNDER THE SKIN IN THE MORNING AND 30 UNITS AT BEDTIME   levothyroxine  (SYNTHROID ) 50 MCG tablet Take 1 tablet (50 mcg total) by mouth daily before breakfast.   metFORMIN  (GLUCOPHAGE ) 1000 MG tablet TAKE 1 TABLET TWICE DAILY   metoCLOPramide  (REGLAN ) 10 MG tablet Take 1 tablet (10 mg total) by mouth every 6 (six) hours as needed.   nystatin  (MYCOSTATIN /NYSTOP ) powder Apply 1 Application topically daily as needed (yeast under belly).   pantoprazole  (PROTONIX ) 40 MG tablet TAKE 1  TABLET TWICE DAILY   potassium chloride  (KLOR-CON  M) 10 MEQ tablet Take 1 tablet (10 mEq total) by mouth daily.   rosuvastatin  (CRESTOR ) 20 MG tablet Take 1 tablet (20 mg total) by mouth at bedtime.   STIOLTO RESPIMAT  2.5-2.5 MCG/ACT AERS INHALE 2 PUFFS EVERY DAY   sucralfate  (CARAFATE ) 1 g tablet Take 1 tablet (1 g total) by mouth 4 (four) times daily.   traMADol  (ULTRAM ) 50 MG tablet Take 1 tablet (50 mg total) by mouth daily as needed for severe pain (pain score  7-10).   TRUE METRIX BLOOD GLUCOSE TEST test strip Use 2x a day   valsartan  (DIOVAN ) 80 MG tablet Take 1 tablet (80 mg total) by mouth daily.   No facility-administered encounter medications on file as of 12/22/2023.    History: Past Medical History:  Diagnosis Date   Anemia    Arthritis    knee and shoulders   Colon polyps 09/05/2017   COPD (chronic obstructive pulmonary disease) (HCC)    Diabetes mellitus without complication (HCC)    type II   Dyspnea    05/16/20 has had a cough for 2.5 years post Covid   Gastritis 09/05/2017   GERD (gastroesophageal reflux disease)    Hyperlipidemia    Hypertension    patient denies   Hypothyroidism    Panic attack    Pneumonia    RUQ pain 03/09/2017   Per patient, she has had RUQ pain 4-5 years that has grown worse in the last few months.   Past Surgical History:  Procedure Laterality Date   BREAST BIOPSY Left 2008   CORE W/CLIP - NEG   BRONCHIAL BIOPSY  05/19/2020   Procedure: BRONCHIAL BIOPSIES;  Surgeon: Shelah Lamar RAMAN, MD;  Location: [facility] ENDOSCOPY;  Service: Pulmonary;;   BRONCHIAL BIOPSY  08/01/2023   Procedure: BRONCHOSCOPY, WITH BIOPSY;  Surgeon: Shelah Lamar RAMAN, MD;  Location: [facility] ENDOSCOPY;  Service: Pulmonary;;   BRONCHIAL BRUSHINGS  05/19/2020   Procedure: BRONCHIAL BRUSHINGS;  Surgeon: Shelah Lamar RAMAN, MD;  Location: [facility] ENDOSCOPY;  Service: Pulmonary;;   BRONCHIAL BRUSHINGS  08/01/2023   Procedure: BRONCHOSCOPY, WITH BRUSH BIOPSY;  Surgeon: Shelah Lamar RAMAN, MD;  Location: [facility] ENDOSCOPY;  Service: Pulmonary;;   BRONCHIAL NEEDLE ASPIRATION BIOPSY  05/19/2020   Procedure: BRONCHIAL NEEDLE ASPIRATION BIOPSIES;  Surgeon: Shelah Lamar RAMAN, MD;  Location: [facility] ENDOSCOPY;  Service: Pulmonary;;   BRONCHIAL NEEDLE ASPIRATION BIOPSY  08/01/2023   Procedure: BRONCHOSCOPY, WITH NEEDLE ASPIRATION BIOPSY;  Surgeon: Shelah Lamar RAMAN, MD;  Location: [facility] ENDOSCOPY;  Service: Pulmonary;;   BRONCHIAL WASHINGS  05/19/2020   Procedure: BRONCHIAL  WASHINGS;  Surgeon: Shelah Lamar RAMAN, MD;  Location: [facility] ENDOSCOPY;  Service: Pulmonary;;   BRONCHIAL WASHINGS  08/01/2023   Procedure: IRRIGATION, BRONCHUS;  Surgeon: Shelah Lamar RAMAN, MD;  Location: [facility] ENDOSCOPY;  Service: Pulmonary;;   BRONCHOSCOPY, WITH BIOPSY USING ELECTROMAGNETIC NAVIGATION Left 08/01/2023   Procedure: BRONCHOSCOPY, WITH BIOPSY USING ELECTROMAGNETIC NAVIGATION;  Surgeon: Shelah Lamar RAMAN, MD;  Location: [facility] ENDOSCOPY;  Service: Pulmonary;  Laterality: Left;   COLONOSCOPY WITH PROPOFOL  N/A 02/08/2017   Procedure: COLONOSCOPY WITH PROPOFOL ;  Surgeon: Gaylyn Gladis PENNER, MD;  Location: [facility] ENDOSCOPY;  Service: Endoscopy;  Laterality: N/A;   COLONOSCOPY WITH PROPOFOL  N/A 05/05/2022   Procedure: COLONOSCOPY WITH PROPOFOL ;  Surgeon: Unk Corinn Skiff, MD;  Location: [facility] ENDOSCOPY;  Service: Gastroenterology;  Laterality: N/A;   ESOPHAGOGASTRODUODENOSCOPY (EGD) WITH PROPOFOL  N/A 02/08/2017   Procedure: ESOPHAGOGASTRODUODENOSCOPY (EGD) WITH PROPOFOL ;  Surgeon: Gaylyn Gladis PENNER, MD;  Location: [facility] ENDOSCOPY;  Service: Endoscopy;  Laterality: N/A;   TUBAL LIGATION     VIDEO BRONCHOSCOPY WITH ENDOBRONCHIAL NAVIGATION N/A 05/19/2020   Procedure: VIDEO BRONCHOSCOPY WITH ENDOBRONCHIAL NAVIGATION;  Surgeon: Shelah Lamar RAMAN, MD;  Location: [facility] ENDOSCOPY;  Service: Pulmonary;  Laterality: N/A;   Family History  Problem Relation Age of Onset   Throat cancer Father    Diabetes Maternal Grandmother    Skin cancer Maternal Grandmother    Breast cancer Neg Hx    Social History   Occupational History   Occupation: retired  Tobacco Use   Smoking status: Former    Current packs/day: 0.00    Average packs/day: 1 pack/day for 40.0 years (40.0 ttl pk-yrs)    Types: Cigarettes    Start date: 1966    Quit date: 2006    Years since quitting: 19.9   Smokeless tobacco: Never   Tobacco comments:    Smoking cessation materials not required  Vaping Use   Vaping status: Never Used   Substance and Sexual Activity   Alcohol use: No   Drug use: No   Sexual activity: Not on file   Tobacco Counseling Counseling given: Not Answered Tobacco comments: Smoking cessation materials not required  SDOH Screenings   Food Insecurity: Food Insecurity Present (08/25/2023)  Housing: Low Risk  (08/25/2023)  Transportation Needs: No Transportation Needs (08/25/2023)  Utilities: Not At Risk (08/25/2023)  Alcohol Screen: Low Risk  (12/24/2022)  Depression (PHQ2-9): Low Risk  (08/25/2023)  Financial Resource Strain: Low Risk  (12/02/2022)  Physical Activity: Insufficiently Active (12/02/2022)  Social Connections: Moderately Isolated (12/02/2022)  Stress: No Stress Concern Present (12/02/2022)  Tobacco Use: Medium Risk (11/15/2023)  Health Literacy: Adequate Health Literacy (12/02/2022)   See flowsheets for full screening details  Depression Screen PHQ 2 & 9 Depression Scale- Over the past 2 weeks, how often have you been bothered by any of the following problems? Little interest or pleasure in doing things: 0 Feeling down, depressed, or hopeless (PHQ Adolescent also includes...irritable): 1 PHQ-2 Total Score: 1     Goals Addressed   None          Objective:    There were no vitals filed for this visit. There is no height or weight on file to calculate BMI.  Hearing/Vision screen No results found. Immunizations and Health Maintenance Health Maintenance  Topic Date Due   Zoster Vaccines- Shingrix (1 of 2) Never done   Mammogram  03/08/2019   OPHTHALMOLOGY EXAM  10/07/2022   Medicare Annual Wellness (AWV)  12/02/2023   HEMOGLOBIN A1C  03/21/2024   FOOT EXAM  05/18/2024   Diabetic kidney evaluation - eGFR measurement  08/22/2024   Diabetic kidney evaluation - Urine ACR  10/02/2024   Colonoscopy  05/04/2025   Bone Density Scan  06/07/2025   Pneumococcal Vaccine: 50+ Years  Completed   Influenza Vaccine  Completed   Hepatitis C Screening  Completed   Meningococcal B  Vaccine  Aged Out   DTaP/Tdap/Td  Discontinued   COVID-19 Vaccine  Discontinued        Assessment/Plan:  This is a routine wellness examination for Jeilyn.  Patient Care Team: Bernardo Fend, DO as PCP - General (Internal Medicine) Hobart Powell BRAVO, MD (Inactive) as PCP - Cardiology (Cardiology) Pa, [facility]  I have personally reviewed and noted the following in the patient's chart:   Medical and social history Use of alcohol, tobacco or illicit drugs  Current medications and supplements including opioid  prescriptions. Functional ability and status Nutritional status Physical activity Advanced directives List of other physicians Hospitalizations, surgeries, and ER visits in previous 12 months Vitals Screenings to include cognitive, depression, and falls Referrals and appointments  No orders of the defined types were placed in this encounter.  In addition, I have reviewed and discussed with patient certain preventive protocols, quality metrics, and best practice recommendations. A written personalized care plan for preventive services as well as general preventive health recommendations were provided to patient.   Jhonnie GORMAN Das, LPN   [id number]   No follow-ups on file.  After Visit Summary: (MyChart) Due to this being a telephonic visit, the after visit summary with patients personalized plan was offered to patient via MyChart   Nurse Notes: UTD ON FLU & PNA; DECLINES SHINGRIX D/T NEVER HAVING CHICKENPOX; NEEDS TDAP; DOESN'T WANT COVID SHOTS; MAMMOGRAM REFERRAL SENT; UTD ON BDS & COLONOSCOPY

## 2023-12-22 NOTE — Patient Instructions (Addendum)
 Ms. Chiquito,  Thank you for taking the time for your Medicare Wellness Visit. I appreciate your continued commitment to your health goals. Please review the care plan we discussed, and feel free to reach out if I can assist you further.  Please note that Annual Wellness Visits do not include a physical exam. Some assessments may be limited, especially if the visit was conducted virtually. If needed, we may recommend an in-person follow-up with your provider.  Ongoing Care Seeing your primary care provider every 3 to 6 months helps us  monitor your health and provide consistent, personalized care.   Referrals If a referral was made during today's visit and you haven't received any updates within two weeks, please contact the referred provider directly to check on the status.  REFERRAL SENT FOR MAMMOGRAM: You have an order for:  []   2D Mammogram  [x]   3D Mammogram  []   Bone Density     Please call for appointment:  [facility]  [address] [place] [address] [phone] [facility] [address] [place], [place] [address] [phone] [facility] [address] Jewell MIRZA [place], [place] [address] [phone]   Make sure to wear two-piece clothing.  No lotions, powders, or deodorants the day of the appointment. Make sure to bring picture ID and insurance card.  Bring list of medications you are currently taking including any supplements.   Schedule your [place] screening mammogram through MyChart!   Log into your MyChart account.  Go to 'Visit' (or 'Appointments' if on mobile App) --> Schedule an Appointment  Under 'Select a Reason for Visit' choose the Mammogram Screening option.  Complete the pre-visit questions and select the time and place that best fits your schedule.   Recommended Screenings:  Health Maintenance  Topic Date Due   Zoster  (Shingles) Vaccine (1 of 2) Never done   Breast Cancer Screening  03/08/2019   Eye exam for diabetics  10/07/2022   Hemoglobin A1C  03/21/2024   Complete foot exam   05/18/2024   Yearly kidney function blood test for diabetes  08/22/2024   Yearly kidney health urinalysis for diabetes  10/02/2024   Medicare Annual Wellness Visit  12/21/2024   Colon Cancer Screening  05/04/2025   Osteoporosis screening with Bone Density Scan  06/07/2025   Pneumococcal Vaccine for age over 59  Completed   Flu Shot  Completed   Hepatitis C Screening  Completed   Meningitis B Vaccine  Aged Out   DTaP/Tdap/Td vaccine  Discontinued   COVID-19 Vaccine  Discontinued     Vision: Annual vision screenings are recommended for early detection of glaucoma, cataracts, and diabetic retinopathy. These exams can also reveal signs of chronic conditions such as diabetes and high blood pressure.  Dental: Annual dental screenings help detect early signs of oral cancer, gum disease, and other conditions linked to overall health, including heart disease and diabetes.  Please see the attached documents for additional preventive care recommendations.   NEXT AWV 12/27/24 @ 8:50 AM IN PERSON

## 2023-12-23 ENCOUNTER — Other Ambulatory Visit: Payer: Self-pay | Admitting: Cardiology

## 2023-12-23 DIAGNOSIS — Z79899 Other long term (current) drug therapy: Secondary | ICD-10-CM

## 2023-12-23 DIAGNOSIS — E782 Mixed hyperlipidemia: Secondary | ICD-10-CM

## 2023-12-23 DIAGNOSIS — I10 Essential (primary) hypertension: Secondary | ICD-10-CM

## 2023-12-28 ENCOUNTER — Other Ambulatory Visit: Payer: Self-pay | Admitting: Cardiology

## 2023-12-28 DIAGNOSIS — I10 Essential (primary) hypertension: Secondary | ICD-10-CM

## 2023-12-28 DIAGNOSIS — E782 Mixed hyperlipidemia: Secondary | ICD-10-CM

## 2023-12-28 DIAGNOSIS — Z79899 Other long term (current) drug therapy: Secondary | ICD-10-CM

## 2023-12-28 MED ORDER — POTASSIUM CHLORIDE CRYS ER 10 MEQ PO TBCR: 10.0000 meq | EXTENDED_RELEASE_TABLET | Freq: Every day | ORAL | 0 refills | Status: DC

## 2024-01-12 IMAGING — CT CT CHEST W/O CM
2 of 4 series · 14 of 36 positions shown, 17 images · non-contrast
Comparison: PET-CT 06/25/2020.  Chest CT 04/19/2020 and 04/16/2020.

CLINICAL DATA: Cavitating left lower lobe lung mass.  Follow-up.



[Series 2: thorax · axial · 0.75mm/px · z∈[-339,-75]mm · 11 of 158 slices shown, 14 images]
[im 13/158  mediastinal]
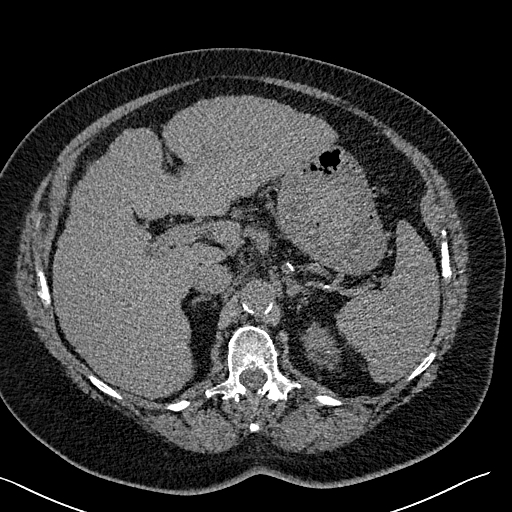
[im 13/158  lung]
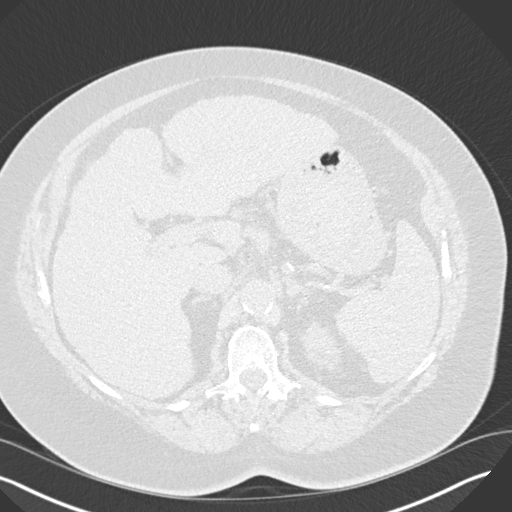
[im 25/158  lung]
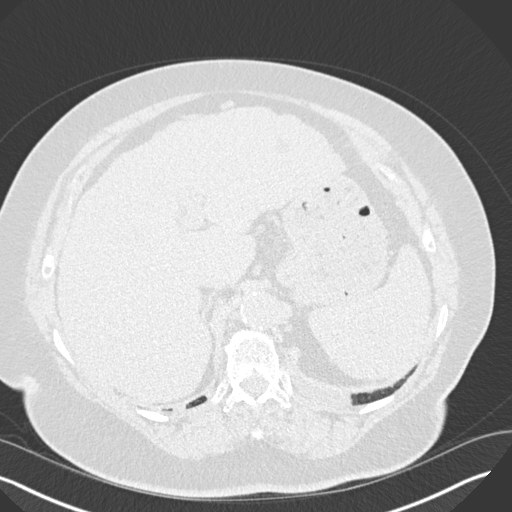
[im 37/158  lung]
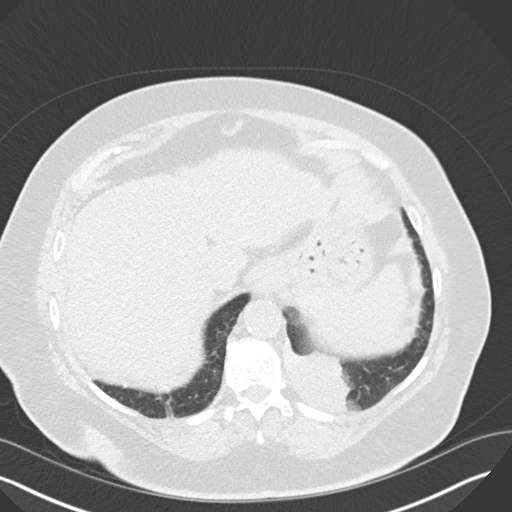
[im 49/158  lung]
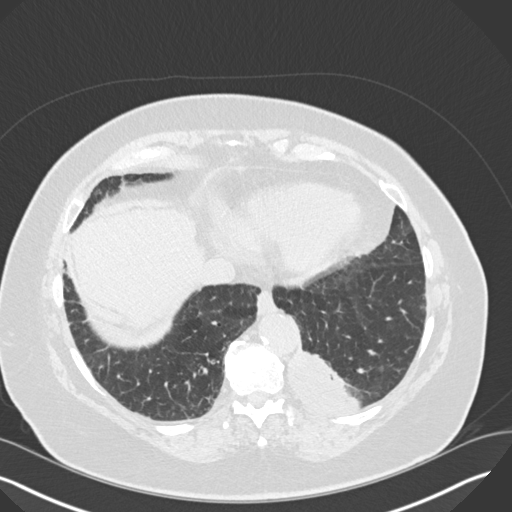
[im 61/158  mediastinal]
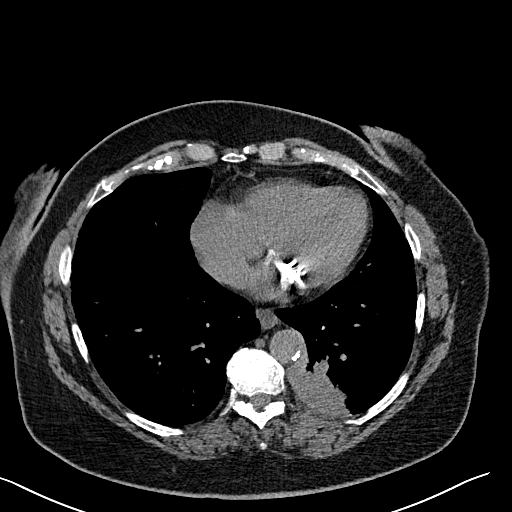
[im 61/158  lung]
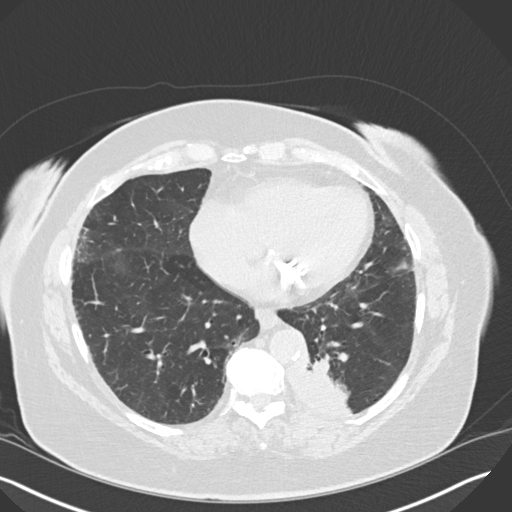
[im 85/158  lung]
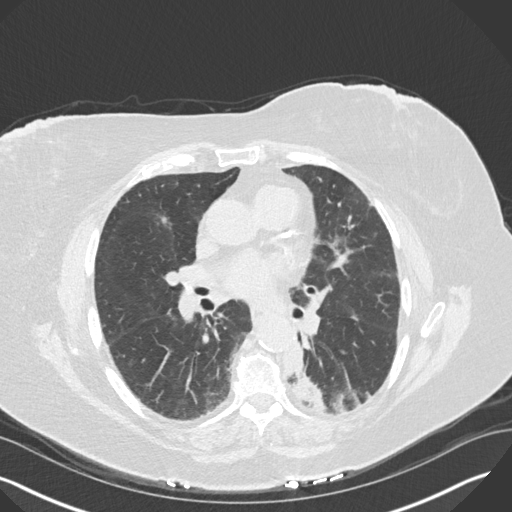
[im 97/158  lung]
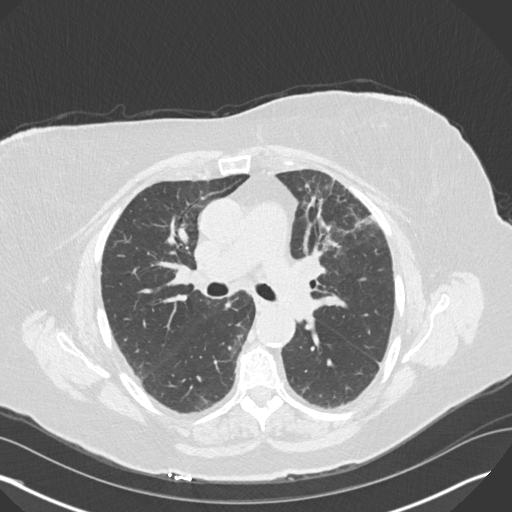
[im 109/158  lung]
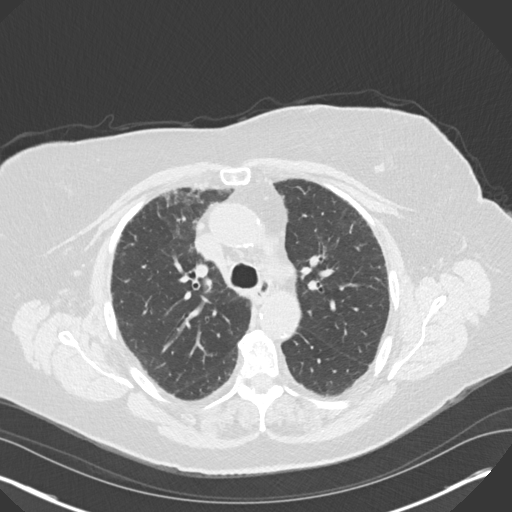
[im 121/158  mediastinal]
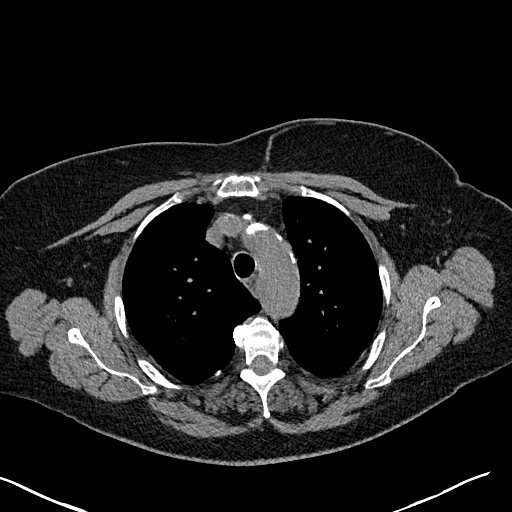
[im 121/158  lung]
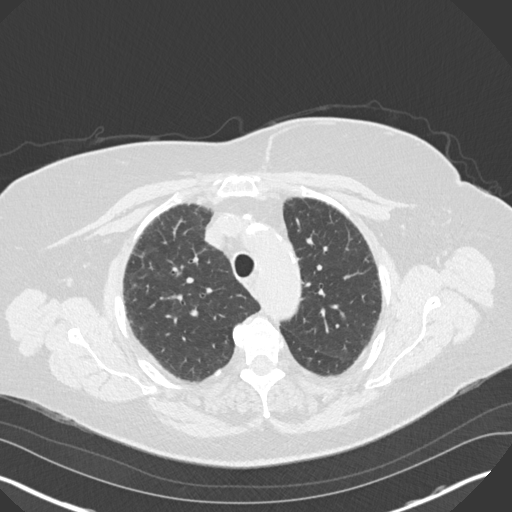
[im 133/158  lung]
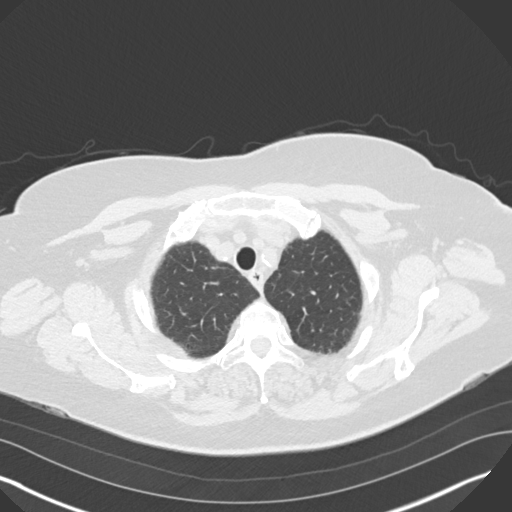
[im 145/158  lung]
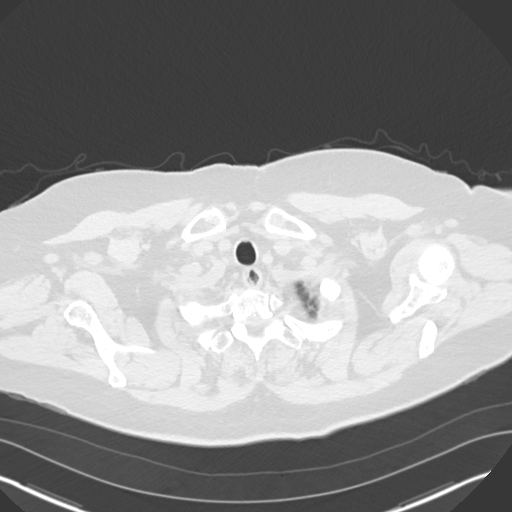

[Series 5: coronal · coronal · 0.62mm/px · 3 of 125 slices shown]
[im 25/125  lung]
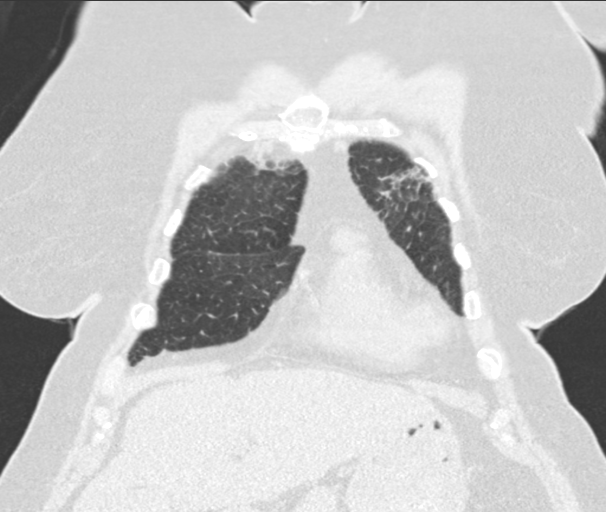
[im 50/125  lung]
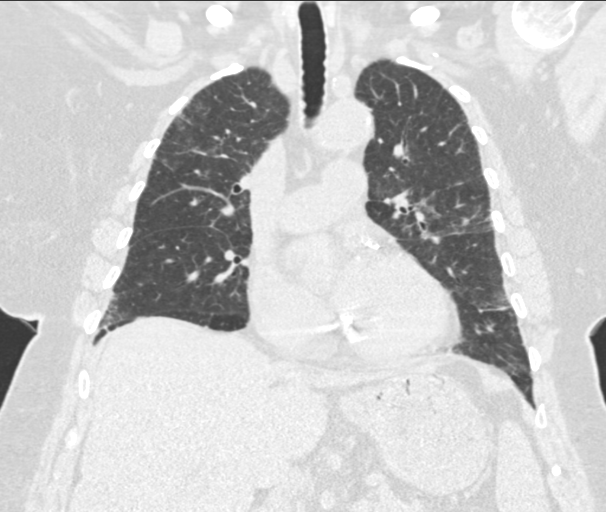
[im 75/125  lung]
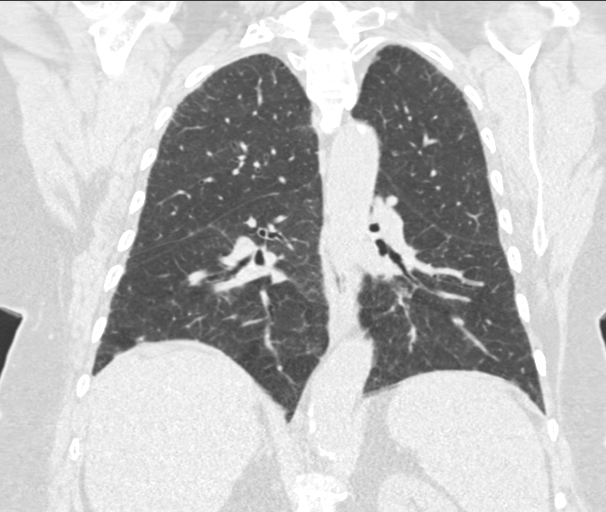

[14 of 36 positions shown; findings below may reference images not displayed]

FINDINGS: Cardiovascular: Atherosclerosis of the aorta, great vessels and
coronary arteries. Prominent calcifications of the mitral annulus.
The heart size is normal. There is no pericardial effusion.

Mediastinum/Nodes: There are no enlarged mediastinal, hilar or
axillary lymph nodes.Small mediastinal lymph nodes appear unchanged.
The thyroid gland, trachea and esophagus demonstrate no significant
findings.

Lungs/Pleura: No pleural effusion or pneumothorax. Persistent
cavitary airspace disease medially in the left lower lobe with
associated volume loss. Overall appearance is similar to the
previous CT study, although there is a progressive component
peripherally which is likely due to postobstructive pneumonitis. The
inferior extension of this process was present to some degree on the
interval PET-CT. No new or enlarging pulmonary nodules. Otherwise
stable chronic lung disease with emphysema, subpleural reticulation
and scattered ground-glass opacities.

Upper abdomen: Advanced changes of cirrhosis are again noted
throughout the liver. Hypodense lesion in the left lobe measuring
1.8 cm on image 131/2 is stable from the previous CT, and showed no
metabolic activity on prior PET-CT, likely a cyst. This is
incompletely characterized without contrast. There is a stable
cm low-density right adrenal nodule consistent with an adenoma.
Aortic and branch vessel atherosclerosis noted.

Musculoskeletal/Chest wall: No acute osseous findings or suspicious
lesions. Multilevel spondylosis.
IMPRESSION: 1. Chronic mass-like consolidation and cavitation medially in the
left lower lobe is similar to prior studies, although there is a
progressive peripheral component inferiorly which is likely due to
post-obstructive pneumonitis. Findings again favor a chronic
infectious/inflammatory process, potentially chronic aspiration.
2. Additional chronic lung disease appears unchanged. No suspicious
pulmonary nodules.
3. Advanced changes of hepatic cirrhosis. Stable small right adrenal
adenoma.
4. Coronary and Aortic Atherosclerosis ([id number]).

## 2024-01-19 ENCOUNTER — Other Ambulatory Visit: Payer: Self-pay | Admitting: Physician Assistant

## 2024-01-19 DIAGNOSIS — Z79899 Other long term (current) drug therapy: Secondary | ICD-10-CM

## 2024-01-19 DIAGNOSIS — I10 Essential (primary) hypertension: Secondary | ICD-10-CM

## 2024-01-19 DIAGNOSIS — E782 Mixed hyperlipidemia: Secondary | ICD-10-CM

## 2024-01-21 ENCOUNTER — Other Ambulatory Visit: Payer: Self-pay | Admitting: Internal Medicine

## 2024-01-23 ENCOUNTER — Ambulatory Visit: Admitting: Internal Medicine

## 2024-01-25 ENCOUNTER — Ambulatory Visit: Admitting: Internal Medicine

## 2024-01-25 ENCOUNTER — Encounter: Payer: Self-pay | Admitting: Internal Medicine

## 2024-01-25 VITALS — BP 146/70 | HR 104 | Resp 18 | Ht 64.17 in | Wt 227.2 lb

## 2024-01-25 DIAGNOSIS — E1159 Type 2 diabetes mellitus with other circulatory complications: Secondary | ICD-10-CM

## 2024-01-25 DIAGNOSIS — E785 Hyperlipidemia, unspecified: Secondary | ICD-10-CM

## 2024-01-25 DIAGNOSIS — E237 Disorder of pituitary gland, unspecified: Secondary | ICD-10-CM

## 2024-01-25 DIAGNOSIS — E039 Hypothyroidism, unspecified: Secondary | ICD-10-CM

## 2024-01-25 DIAGNOSIS — E1165 Type 2 diabetes mellitus with hyperglycemia: Secondary | ICD-10-CM

## 2024-01-25 DIAGNOSIS — Z7984 Long term (current) use of oral hypoglycemic drugs: Secondary | ICD-10-CM | POA: Diagnosis not present

## 2024-01-25 LAB — POCT GLYCOSYLATED HEMOGLOBIN (HGB A1C): Hemoglobin A1C: 7 % — AB (ref 4.0–5.6)

## 2024-01-25 NOTE — Patient Instructions (Addendum)
 Please continue: - Metformin  1000 mg 2x a day - NPH 40 units in am and 30 units at bedtime  Check some sugars later in the day: before dinner, after dinner, bedtime.   Please continue Levothyroxine  50 mcg daily.  Take the thyroid  hormone every day, with water, at least 30 minutes before breakfast, separated by at least 4 hours from: - acid reflux medications - calcium  - iron  - multivitamins  Please return in 3-4 months with your sugar log.

## 2024-01-25 NOTE — Progress Notes (Signed)
 -Patient ID: Tamara [name], female   DOB: [birth year], 79 y.o.   MRN: [id number]  HPI: Tamara [name] is a 79 y.o.-year-old female, returning for follow-up for DM2, dx in 2006, insulin -dependent, uncontrolled, with complications (aortic atherosclerosis) and hypothyroidism Pt. previously saw Dr. Kassie, but last visit with me 4 months ago. She is the mother of Tamara [name], also my pt.  She is here with her husband.  Interim hx: No increased urination, blurry vision, nausea.  She does have significant shortness of breath and is on oxygen now - 2 Lpm. Before last visit, she was diagnosed with lung cancer. No plan for surgery. She initially refused chemotherapy. At this visit, she mentions she does not qualifies for it 2/2 liver ds. She has a cough.  DM2: Reviewed HbA1c: Lab Results  Component Value Date   HGBA1C 6.7 (A) 09/22/2023   HGBA1C 7.7 (A) 05/19/2023   HGBA1C 7.0 (A) 12/23/2022   HGBA1C 8.7 (H) 10/27/2022   HGBA1C 8.8 (H) 09/23/2022   HGBA1C 7.7 (A) 06/10/2022   HGBA1C 7.7 (A) 11/16/2021   HGBA1C 10.1 (A) 08/11/2021   HGBA1C 8.7 (A) 03/26/2021   HGBA1C 7.3 (A) 11/26/2020   HGBA1C 7.4 (A) 07/18/2020   HGBA1C 7.5 (A) 03/19/2020   HGBA1C 8.0 (A) 12/19/2019   HGBA1C 7.4 (A) 09/17/2019   HGBA1C 9.0 (A) 07/17/2019   HGBA1C 11.3 (A) 05/09/2019   HGBA1C 10.8 (H) 03/22/2019   HGBA1C 8.2 (H) 11/22/2018   HGBA1C 9.4 (H) 08/15/2018   HGBA1C 7.1 (H) 04/11/2018   HGBA1C 6.6 (H) 12/07/2017   HGBA1C 6.9 (H) 09/05/2017   HGBA1C 6.6 (H) 05/26/2017   HGBA1C 6.4 (H) 02/23/2017   HGBA1C 5.9 (H) 11/22/2016   HGBA1C 5.4 08/23/2016   Pt is on a regimen of: - Metformin  1000 mg 2x a day  - NPH  40 units in a.m. and 30 units at bedtime She tried metformin  in the past but this caused arthralgias, however, she restarted as her CBGs were in the 400 She was previously on insulin  in 2017-2018, but came off after losing approximately 40 pounds. Per Dr. Laymond note, she previously  declined multiple daily insulin  injections. We tried Farxiga  07/2021 >> $$$.  Pt checks her sugars 2x a day and they are: - am: 82-123, 131 >> 95-120s, 160 >> 85-135, 145 >> 113-165, 170 - 2h after b'fast: n/c  - before lunch: 111-157 >> n/c >> 120-167 >> n/c - 2h after lunch: n/c  >> 146- 177 >> 131-162 >> n/c - before dinner:  n/c >> 110-154 >> 135-140 >> n/c - 2h after dinner: n/c - bedtime: 164 >> n/c - nighttime: n/c Lowest sugar was 85 >> 90s; she has hypoglycemia awareness at 70.  Highest sugar was 500s -steroid >> .SABRASABRA200s >> 170.  She has been on steroids for Covid PNA >> 09-10/2022.  In house, she was septic and CBG increased to 500.  Glucometer: True Metrix  - no CKD, last BUN/creatinine:  Lab Results  Component Value Date   BUN 23 08/23/2023   BUN 8 08/01/2023   CREATININE 1.39 (H) 08/23/2023   CREATININE 1.07 (H) 08/01/2023   Lab Results  Component Value Date   MICRALBCREAT 7 10/03/2023   MICRALBCREAT 6 12/23/2022   MICRALBCREAT 7 10/21/2021  She is not on ACE inhibitor/ARB.  -+ HL; last set of lipids: Lab Results  Component Value Date   CHOL 149 05/19/2023   HDL 47 (L) 05/19/2023   LDLCALC 71 05/19/2023   LDLDIRECT  64 03/15/2022   TRIG 216 (H) 05/19/2023   CHOLHDL 3.2 05/19/2023  On Crestor  20 mg and Zetia  10 daily.  - last eye exam was in 09/2021. No DR reportingly. + cataracts.  - no numbness and tingling in her feet.  Last foot exam 05/19/2023.  Reviewed latest imaging: CT chest (08/06/2021): Atherosclerosis of aorta, great vessels, and coronary arteries.  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  Of note, this mass was not FDG avid on PET scan.  Hypothyroidism:  She is on levothyroxine  50 mcg daily: - in am - fasting - at least 30 min from b'fast - no calcium  - no iron  - no multivitamins - + PPIs - Protonix  30 min later! >> moved >4h later - stopped Biotin 5000 mcg daily  Reviewed her latest TSH levels: Lab  Results  Component Value Date   TSH 3.24 10/03/2023   TSH 3.33 10/27/2022   TSH 3.34 08/26/2021   TSH 3.88 07/28/2020   TSH 2.24 11/15/2019   TSH 2.45 03/22/2019   TSH 4.62 (H) 11/22/2018   TSH 3.92 05/26/2017   She also has a history of HTN, GERD.  Since last visit, she had a brain MRI (08/19/2023) showing a pituitary lesion: 1. No evidence of intracranial metastatic disease. 2. 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 No further imaging evaluation or imaging follow-up is necessary for this finding. Consider endocrine function tests and correlate for a history of pituitary hypersecretion. 3. Mild chronic small vessel ischemic changes within the cerebral white matter.  Pituitary hormones were evaluated at last visit: Component     Latest Ref Rng 10/03/2023  TSH     0.40 - 4.50 mIU/L 3.24   T4,Free(Direct)     0.8 - 1.8 ng/dL 1.2   Prolactin     ng/mL 7.0   LH     mIU/mL 35.9   FSH     mIU/mL 77.7   Growth Hormone     < OR = 7.1 ng/mL 0.2   IGF-I, LC/MS     34 - 245 ng/mL 133   Z-Score (Female)     -2.0 - 2.0 SD 0.6   Cortisol, Plasma     mcg/dL 7.9   R793 ACTH      6 - 50 pg/mL 18    ROS: + see HPI  Past Medical History:  Diagnosis Date   Anemia    Arthritis    knee and shoulders   Colon polyps 09/05/2017   COPD (chronic obstructive pulmonary disease) (HCC)    Diabetes mellitus without complication (HCC)    type II   Dyspnea    05/16/20 has had a cough for 2.5 years post Covid   Gastritis 09/05/2017   GERD (gastroesophageal reflux disease)    Hyperlipidemia    Hypertension    patient denies   Hypothyroidism    Panic attack    Pneumonia    RUQ pain 03/09/2017   Per patient, she has had RUQ pain 4-5 years that has grown worse in the last few months.   Past Surgical History:  Procedure Laterality Date   BREAST BIOPSY Left 2008   CORE W/CLIP - NEG   BRONCHIAL BIOPSY  05/19/2020    Procedure: BRONCHIAL BIOPSIES;  Surgeon: Shelah Lamar RAMAN, MD;  Location: [facility] ENDOSCOPY;  Service: Pulmonary;;   BRONCHIAL BIOPSY  08/01/2023   Procedure: BRONCHOSCOPY, WITH BIOPSY;  Surgeon: Shelah Lamar  S, MD;  Location: [facility] ENDOSCOPY;  Service: Pulmonary;;   BRONCHIAL BRUSHINGS  05/19/2020   Procedure: BRONCHIAL BRUSHINGS;  Surgeon: Shelah Lamar RAMAN, MD;  Location: [facility] ENDOSCOPY;  Service: Pulmonary;;   BRONCHIAL BRUSHINGS  08/01/2023   Procedure: BRONCHOSCOPY, WITH BRUSH BIOPSY;  Surgeon: Shelah Lamar RAMAN, MD;  Location: [facility] ENDOSCOPY;  Service: Pulmonary;;   BRONCHIAL NEEDLE ASPIRATION BIOPSY  05/19/2020   Procedure: BRONCHIAL NEEDLE ASPIRATION BIOPSIES;  Surgeon: Shelah Lamar RAMAN, MD;  Location: [facility] ENDOSCOPY;  Service: Pulmonary;;   BRONCHIAL NEEDLE ASPIRATION BIOPSY  08/01/2023   Procedure: BRONCHOSCOPY, WITH NEEDLE ASPIRATION BIOPSY;  Surgeon: Shelah Lamar RAMAN, MD;  Location: [facility] ENDOSCOPY;  Service: Pulmonary;;   BRONCHIAL WASHINGS  05/19/2020   Procedure: BRONCHIAL WASHINGS;  Surgeon: Shelah Lamar RAMAN, MD;  Location: [facility] ENDOSCOPY;  Service: Pulmonary;;   BRONCHIAL WASHINGS  08/01/2023   Procedure: IRRIGATION, BRONCHUS;  Surgeon: Shelah Lamar RAMAN, MD;  Location: [facility] ENDOSCOPY;  Service: Pulmonary;;   BRONCHOSCOPY, WITH BIOPSY USING ELECTROMAGNETIC NAVIGATION Left 08/01/2023   Procedure: BRONCHOSCOPY, WITH BIOPSY USING ELECTROMAGNETIC NAVIGATION;  Surgeon: Shelah Lamar RAMAN, MD;  Location: [facility] ENDOSCOPY;  Service: Pulmonary;  Laterality: Left;   COLONOSCOPY WITH PROPOFOL  N/A 02/08/2017   Procedure: COLONOSCOPY WITH PROPOFOL ;  Surgeon: Gaylyn Gladis PENNER, MD;  Location: [facility] ENDOSCOPY;  Service: Endoscopy;  Laterality: N/A;   COLONOSCOPY WITH PROPOFOL  N/A 05/05/2022   Procedure: COLONOSCOPY WITH PROPOFOL ;  Surgeon: Unk Corinn Skiff, MD;  Location: [facility] ENDOSCOPY;  Service: Gastroenterology;  Laterality: N/A;   ESOPHAGOGASTRODUODENOSCOPY (EGD) WITH PROPOFOL  N/A 02/08/2017   Procedure:  ESOPHAGOGASTRODUODENOSCOPY (EGD) WITH PROPOFOL ;  Surgeon: Gaylyn Gladis PENNER, MD;  Location: [facility] ENDOSCOPY;  Service: Endoscopy;  Laterality: N/A;   TUBAL LIGATION     VIDEO BRONCHOSCOPY WITH ENDOBRONCHIAL NAVIGATION N/A 05/19/2020   Procedure: VIDEO BRONCHOSCOPY WITH ENDOBRONCHIAL NAVIGATION;  Surgeon: Shelah Lamar RAMAN, MD;  Location: [facility] ENDOSCOPY;  Service: Pulmonary;  Laterality: N/A;   Social History   Socioeconomic History   Marital status: Married    Spouse name: Carol Theys   Number of children: 3   Years of education: Not on file   Highest education level: High school graduate  Occupational History   Occupation: retired  Tobacco Use   Smoking status: Former    Current packs/day: 0.00    Average packs/day: 1 pack/day for 40.0 years (40.0 ttl pk-yrs)    Types: Cigarettes    Start date: 1966    Quit date: 2006    Years since quitting: 20.0   Smokeless tobacco: Never   Tobacco comments:    Smoking cessation materials not required  Vaping Use   Vaping status: Never Used  Substance and Sexual Activity   Alcohol use: No   Drug use: No   Sexual activity: Not on file  Other Topics Concern   Not on file  Social History Narrative   Not on file   Social Drivers of Health   Tobacco Use: Medium Risk (12/22/2023)   Patient History    Smoking Tobacco Use: Former    Smokeless Tobacco Use: Never    Passive Exposure: Not on Actuary Strain: Low Risk (12/02/2022)   Overall Financial Resource Strain (CARDIA)    Difficulty of Paying Living Expenses: Not hard at all  Food Insecurity: No Food Insecurity (12/22/2023)   Epic    Worried About Programme Researcher, Broadcasting/film/video in the Last Year: Never true    Ran Out of Food in the Last Year: Never true  Transportation Needs:  No Transportation Needs (12/22/2023)   Epic    Lack of Transportation (Medical): No    Lack of Transportation (Non-Medical): No  Physical Activity: Inactive (12/22/2023)   Exercise Vital Sign    Days of  Exercise per Week: 0 days    Minutes of Exercise per Session: 0 min  Stress: No Stress Concern Present (12/22/2023)   [employer] of Occupational Health - Occupational Stress Questionnaire    Feeling of Stress: Not at all  Social Connections: Moderately Isolated (12/22/2023)   Social Connection and Isolation Panel    Frequency of Communication with Friends and Family: More than three times a week    Frequency of Social Gatherings with Friends and Family: Twice a week    Attends Religious Services: Never    Database Administrator or Organizations: No    Attends Banker Meetings: Never    Marital Status: Married  Catering Manager Violence: Not At Risk (12/22/2023)   Epic    Fear of Current or Ex-Partner: No    Emotionally Abused: No    Physically Abused: No    Sexually Abused: No  Depression (PHQ2-9): Low Risk (12/22/2023)   Depression (PHQ2-9)    PHQ-2 Score: 0  Alcohol Screen: Low Risk (12/24/2022)   Alcohol Screen    Last Alcohol Screening Score (AUDIT): 0  Housing: Unknown (12/22/2023)   Epic    Unable to Pay for Housing in the Last Year: No    Number of Times Moved in the Last Year: Not on file    Homeless in the Last Year: No  Utilities: Not At Risk (12/22/2023)   Epic    Threatened with loss of utilities: No  Health Literacy: Adequate Health Literacy (12/22/2023)   B1300 Health Literacy    Frequency of need for help with medical instructions: Never   Current Outpatient Medications on File Prior to Visit  Medication Sig Dispense Refill   potassium chloride  (KLOR-CON  M) 10 MEQ tablet TAKE 1 TABLET EVERY DAY (MUST SCHEDULE AN OVERDUE FOLLOW-UP APPT WITH A NEW CARDIOLOGIST FOR MORE REFILLS. [id number]) 15 tablet 0   Accu-Chek Softclix Lancets lancets Use as instructed 200 each 3   albuterol  (PROVENTIL ) (2.5 MG/3ML) 0.083% nebulizer solution Take 3 mLs (2.5 mg total) by nebulization every 6 (six) hours as needed for wheezing or shortness of breath. 180 mL 5    albuterol  (VENTOLIN  HFA) 108 (90 Base) MCG/ACT inhaler Inhale 2 puffs into the lungs every 6 (six) hours as needed for wheezing or shortness of breath. 8 g 6   Alcohol Swabs (DROPSAFE ALCOHOL PREP) 70 % PADS USE AS DIRECTED TWO TIMES DAILY WHEN TESTING BLOOD SUGAR 200 each 3   Ascorbic Acid  (VITAMIN C ) 1000 MG tablet Take 1,000 mg by mouth daily.     aspirin  EC 81 MG tablet Take 1 tablet (81 mg total) by mouth daily. Swallow whole. 90 tablet 3   Baclofen  5 MG TABS Take 1 tablet (5 mg total) by mouth at bedtime as needed (msk pain or spasms). 90 tablet 1   Blood Glucose Monitoring Suppl (TRUE METRIX AIR GLUCOSE METER) w/Device KIT USE AS DIRECTED 1 kit 3   Cholecalciferol  (VITAMIN D ) 50 MCG (2000 UT) CAPS Take 1,000 Units by mouth daily.     DROPLET PEN NEEDLES 32G X 4 MM MISC USE AS DIRECTED 200 each 3   ezetimibe  (ZETIA ) 10 MG tablet TAKE 1 TABLET EVERY DAY 90 tablet 2   Ferrous Fumarate (HEMOCYTE - 106 MG FE) 324 (  106 Fe) MG TABS tablet Take 1 tablet by mouth daily.     Ferrous Fumarate-DSS (FE CAPS/STOOL SOFTENER) 50-100 MG TABS Take 50-100 mg by mouth daily. 30 tablet 3   furosemide  (LASIX ) 40 MG tablet TAKE 1 TABLET EVERY DAY (Patient taking differently: Take 40 mg by mouth daily. TAKES PRN) 90 tablet 1   hydrochlorothiazide  (MICROZIDE ) 12.5 MG capsule Take 1 capsule (12.5 mg total) by mouth daily. 90 capsule 1   HYDROcodone  bit-homatropine (HYCODAN) 5-1.5 MG/5ML syrup Take 5 mLs by mouth every 12 (twelve) hours as needed for cough. 50 mL 0   Insulin  NPH, Human,, Isophane, (NOVOLIN  N FLEXPEN) 100 UNIT/ML Kiwkpen INJECT 40 UNITS UNDER THE SKIN IN THE MORNING AND 30 UNITS AT BEDTIME 60 mL 3   levothyroxine  (SYNTHROID ) 50 MCG tablet Take 1 tablet (50 mcg total) by mouth daily before breakfast. 90 tablet 1   metFORMIN  (GLUCOPHAGE ) 1000 MG tablet TAKE 1 TABLET TWICE DAILY 180 tablet 3   metoCLOPramide  (REGLAN ) 10 MG tablet Take 1 tablet (10 mg total) by mouth every 6 (six) hours as needed. (Patient  not taking: Reported on 12/22/2023) 30 tablet 0   nystatin  (MYCOSTATIN /NYSTOP ) powder Apply 1 Application topically daily as needed (yeast under belly). 60 g 2   pantoprazole  (PROTONIX ) 40 MG tablet TAKE 1 TABLET TWICE DAILY 180 tablet 3   rosuvastatin  (CRESTOR ) 20 MG tablet Take 1 tablet (20 mg total) by mouth at bedtime. 90 tablet 1   STIOLTO RESPIMAT  2.5-2.5 MCG/ACT AERS INHALE 2 PUFFS EVERY DAY 4 g 11   sucralfate  (CARAFATE ) 1 g tablet Take 1 tablet (1 g total) by mouth 4 (four) times daily. (Patient not taking: Reported on 12/22/2023) 120 tablet 1   traMADol  (ULTRAM ) 50 MG tablet Take 1 tablet (50 mg total) by mouth daily as needed for severe pain (pain score 7-10). 30 tablet 2   TRUE METRIX BLOOD GLUCOSE TEST test strip Use 2x a day 200 each 3   valsartan  (DIOVAN ) 80 MG tablet Take 1 tablet (80 mg total) by mouth daily. 90 tablet 1   No current facility-administered medications on file prior to visit.   Allergies  Allergen Reactions   Penicillins Hives    Tolerated rocephin  09/2022 Reaction: 1964   Family History  Problem Relation Age of Onset   Throat cancer Father    Diabetes Maternal Grandmother    Skin cancer Maternal Grandmother    Breast cancer Neg Hx    PE: BP (!) 146/70   Pulse (!) 104   Resp 18   Ht 5' 4.17 (1.63 m)   Wt 227 lb 3.2 oz (103.1 kg)   SpO2 97%   BMI 38.79 kg/m  Wt Readings from Last 3 Encounters:  01/25/24 227 lb 3.2 oz (103.1 kg)  11/15/23 226 lb 3.2 oz (102.6 kg)  10/27/23 224 lb 12.8 oz (102 kg)   Constitutional: overweight, in NAD Eyes: no exophthalmos ENT:  no thyromegaly, no cervical lymphadenopathy Cardiovascular: Tachycardia, RR, No MRG, + B LE edema Respiratory: + Bilateral wheezes (L>R), crackles throughout both pulmonary fields Musculoskeletal: no deformities Skin:  no rashes Neurological: no tremor with outstretched hands  ASSESSMENT: 1. DM2, insulin -dependent, uncontrolled, with  complications - Aortic atherosclerosis per PET  scan from 10/22/2022  2.  Hypothyroidism  3. Hyperlipidemia  4.  Pituitary lesion  PLAN:  1. Patient with history of uncontrolled type 2 diabetes, on intermediate acting insulin  and metformin .  At last visit, HbA1c was better, at 6.7%, improved from 7.7%.  Sugars were mostly at goal when waking up but she was not checking later in the day.  I recommended to continue the same regimen but check some sugars later in the day.   - Of note, she previously declined GLP-1 receptor agonist and did not want to add any other diabetic medication to her regimen. - at this visit, sugars appear to be higher, mostly above target at waking up and she is still not checking later in the day.  We discussed about the importance of occasionally checking before dinner, or later, in the evening, but otherwise I do not feel we need to change her regimen, especially since she attributes the higher blood sugars to the holidays, which have now ended. - I suggested to:  Patient Instructions  Please continue: - Metformin  1000 mg 2x a day - NPH 40 units in am and 30 units at bedtime  Check some sugars later in the day: before dinner, after dinner, bedtime.   Please continue Levothyroxine  50 mcg daily.  Take the thyroid  hormone every day, with water, at least 30 minutes before breakfast, separated by at least 4 hours from: - acid reflux medications - calcium  - iron  - multivitamins  Please return in 3-4 months with your sugar log.   - we checked her HbA1c: 7.0% (higher) - advised to check sugars at different times of the day - 2x a day, rotating check times - advised for yearly eye exams >> she is UTD - return to clinic in 3-4 months  2. Hypothyroidism: - latest thyroid  labs reviewed with pt. >> normal: Lab Results  Component Value Date   TSH 3.24 10/03/2023  - she continues on LT4 50 mcg daily - pt feels good on this dose. - we discussed about taking the thyroid  hormone every day, with water, >30 minutes  before breakfast, separated by >4 hours from acid reflux medications, calcium , iron , multivitamins. Pt. is taking it correctly.   3. HL - Latest lipid panel was reviewed from 05/2023: LDL above our target of less than 55, triglycerides elevated, HDL low: Lab Results  Component Value Date   CHOL 149 05/19/2023   HDL 47 (L) 05/19/2023   LDLCALC 71 05/19/2023   LDLDIRECT 64 03/15/2022   TRIG 216 (H) 05/19/2023   CHOLHDL 3.2 05/19/2023  - She continues on Crestor  20 mg daily and Zetia  10 mg daily without side effects  4.  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  We discussed that if this was negative, no further investigation was needed.  She was on 5000 mcg of biotin daily at that time and I advised her to come back for labs.  All of the pituitary labs were normal. - Will continue to follow this expectantly, no imaging needed for now  Lela Fendt, MD PhD [facility]

## 2024-01-28 ENCOUNTER — Other Ambulatory Visit: Payer: Self-pay | Admitting: Internal Medicine

## 2024-02-15 ENCOUNTER — Ambulatory Visit: Admitting: Internal Medicine

## 2024-02-23 ENCOUNTER — Ambulatory Visit: Admitting: Emergency Medicine

## 2024-03-15 ENCOUNTER — Ambulatory Visit: Admitting: Emergency Medicine

## 2024-04-18 IMAGING — DX DG CHEST 2V
2 series · 2 of 2 positions shown · non-contrast
Comparison: CT chest 02/06/2021, AP chest 05/19/2020, chest two
views 11/15/2019

CLINICAL DATA: COPD exacerbation.

EXAM:
CHEST - 2 VIEW

[chest pa]
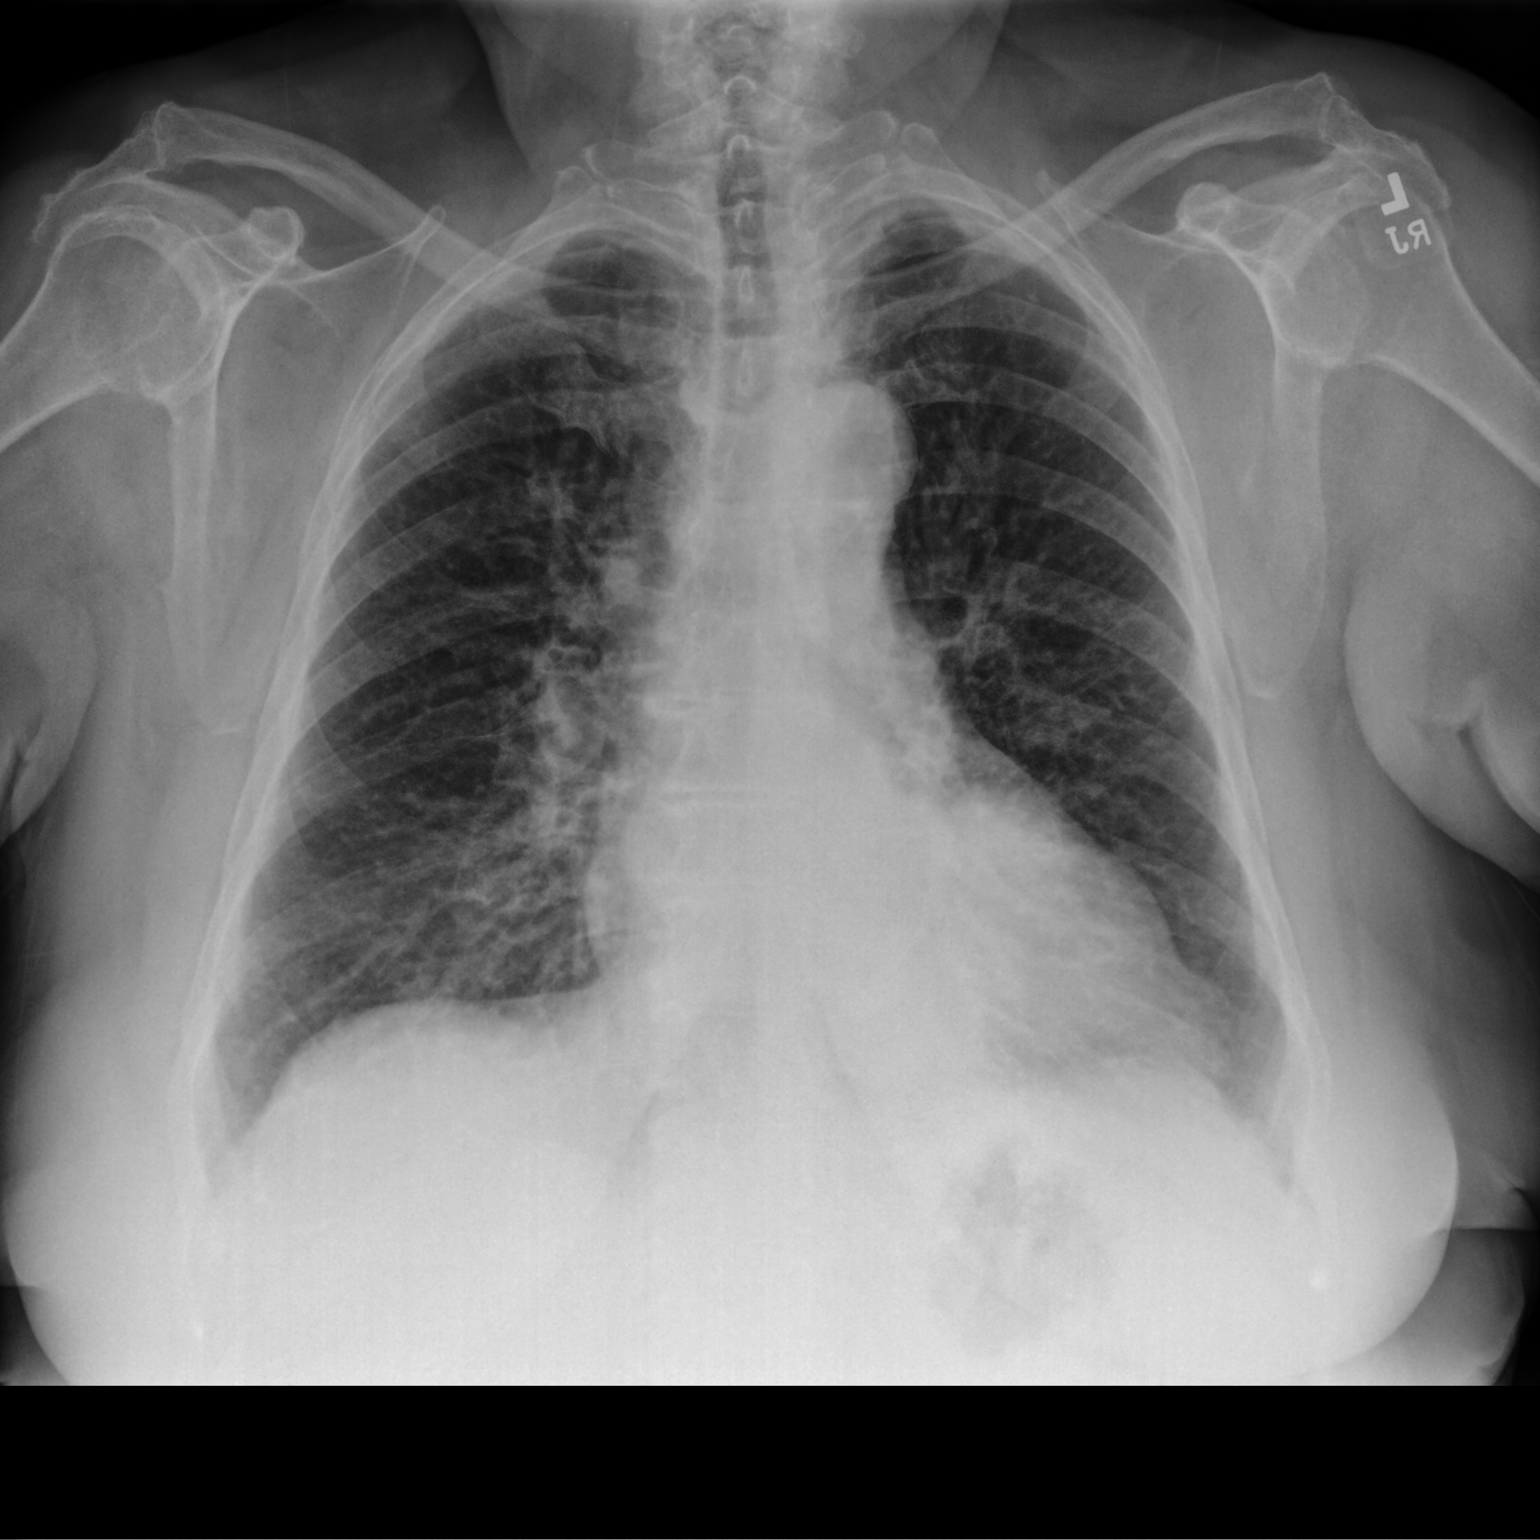

[chest lat]
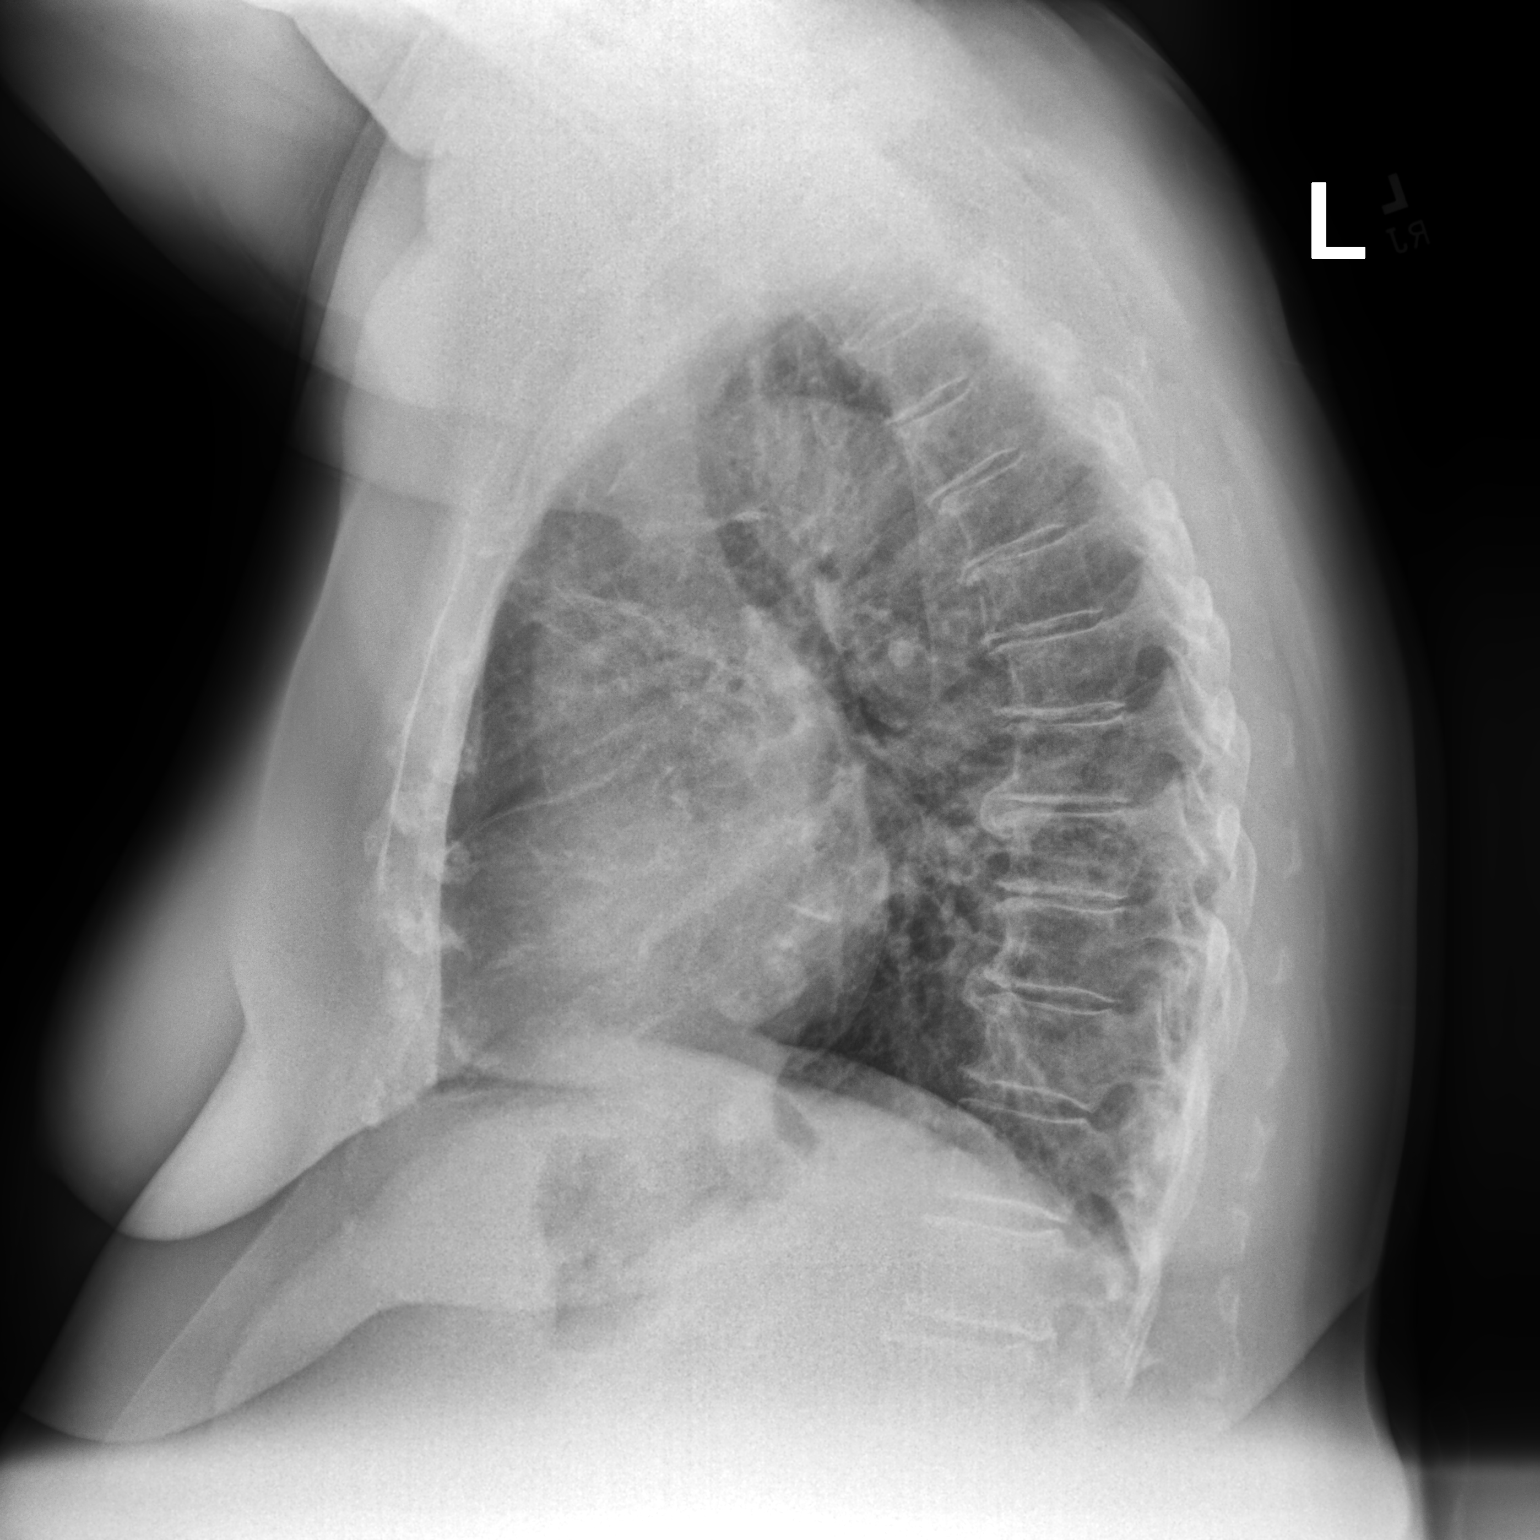

[2 of 2 positions shown; findings below may reference images not displayed]

FINDINGS: Cardiac silhouette and mediastinal contours are within normal limits
with mild calcification within aortic arch. There is
mild-to-moderate interstitial thickening which appears chronic. No
definite focal airspace opacity to indicate pneumonia. No pleural
effusion or pneumothorax. Moderate multilevel degenerative disc
changes of the thoracic spine.
IMPRESSION: Chronic bilateral interstitial thickening favored to represent
chronic scarring. No definite pneumonia. There appears to be
resolution of the left posterior lower lobe pneumonia seen on prior
02/06/2021 CT.

## 2024-05-23 ENCOUNTER — Ambulatory Visit: Admitting: Internal Medicine

## 2024-12-27 ENCOUNTER — Ambulatory Visit
